# Patient Record
Sex: Male | Born: 1965 | Race: Black or African American | Hispanic: No | Smoking: Never smoker
Health system: Southern US, Community
[De-identification: ages and names within clinical notes are randomized; demographics above are authoritative.]

## PROBLEM LIST (undated history)

## (undated) ENCOUNTER — Emergency Department (HOSPITAL_COMMUNITY): Admission: EM | Payer: No Typology Code available for payment source | Source: Home / Self Care

## (undated) DIAGNOSIS — Z8709 Personal history of other diseases of the respiratory system: Secondary | ICD-10-CM

## (undated) DIAGNOSIS — L89159 Pressure ulcer of sacral region, unspecified stage: Secondary | ICD-10-CM

## (undated) DIAGNOSIS — Z8669 Personal history of other diseases of the nervous system and sense organs: Secondary | ICD-10-CM

## (undated) DIAGNOSIS — J860 Pyothorax with fistula: Secondary | ICD-10-CM

## (undated) DIAGNOSIS — F419 Anxiety disorder, unspecified: Secondary | ICD-10-CM

## (undated) DIAGNOSIS — S129XXA Fracture of neck, unspecified, initial encounter: Secondary | ICD-10-CM

## (undated) DIAGNOSIS — M1990 Unspecified osteoarthritis, unspecified site: Secondary | ICD-10-CM

## (undated) DIAGNOSIS — L8990 Pressure ulcer of unspecified site, unspecified stage: Secondary | ICD-10-CM

## (undated) DIAGNOSIS — Z978 Presence of other specified devices: Secondary | ICD-10-CM

## (undated) DIAGNOSIS — T8859XA Other complications of anesthesia, initial encounter: Secondary | ICD-10-CM

## (undated) DIAGNOSIS — Z87442 Personal history of urinary calculi: Secondary | ICD-10-CM

## (undated) DIAGNOSIS — S14109S Unspecified injury at unspecified level of cervical spinal cord, sequela: Secondary | ICD-10-CM

## (undated) DIAGNOSIS — Z960 Presence of urogenital implants: Secondary | ICD-10-CM

## (undated) DIAGNOSIS — G8250 Quadriplegia, unspecified: Secondary | ICD-10-CM

## (undated) DIAGNOSIS — K219 Gastro-esophageal reflux disease without esophagitis: Secondary | ICD-10-CM

## (undated) DIAGNOSIS — D649 Anemia, unspecified: Secondary | ICD-10-CM

## (undated) DIAGNOSIS — S2220XA Unspecified fracture of sternum, initial encounter for closed fracture: Secondary | ICD-10-CM

## (undated) DIAGNOSIS — T884XXA Failed or difficult intubation, initial encounter: Secondary | ICD-10-CM

## (undated) HISTORY — PX: PEG TUBE REMOVAL: SHX2187

## (undated) HISTORY — PX: APPENDECTOMY: SHX54

## (undated) HISTORY — PX: OTHER SURGICAL HISTORY: SHX169

---

## 1999-04-08 DIAGNOSIS — K219 Gastro-esophageal reflux disease without esophagitis: Secondary | ICD-10-CM | POA: Insufficient documentation

## 2006-10-28 ENCOUNTER — Ambulatory Visit (HOSPITAL_COMMUNITY): Admission: RE | Admit: 2006-10-28 | Discharge: 2006-10-28 | Payer: Self-pay | Admitting: Family Medicine

## 2006-10-29 ENCOUNTER — Ambulatory Visit: Payer: Self-pay | Admitting: Cardiology

## 2011-03-31 ENCOUNTER — Encounter: Payer: Self-pay | Admitting: *Deleted

## 2011-03-31 ENCOUNTER — Emergency Department (HOSPITAL_COMMUNITY)
Admission: EM | Admit: 2011-03-31 | Discharge: 2011-03-31 | Disposition: A | Discharge status: Home / Self Care | Payer: BC Managed Care – PPO | Attending: Emergency Medicine | Admitting: Emergency Medicine

## 2011-03-31 ENCOUNTER — Emergency Department (HOSPITAL_COMMUNITY): Payer: BC Managed Care – PPO

## 2011-03-31 DIAGNOSIS — Z9889 Other specified postprocedural states: Secondary | ICD-10-CM | POA: Insufficient documentation

## 2011-03-31 DIAGNOSIS — X500XXA Overexertion from strenuous movement or load, initial encounter: Secondary | ICD-10-CM | POA: Insufficient documentation

## 2011-03-31 DIAGNOSIS — M47816 Spondylosis without myelopathy or radiculopathy, lumbar region: Secondary | ICD-10-CM

## 2011-03-31 DIAGNOSIS — S39012A Strain of muscle, fascia and tendon of lower back, initial encounter: Secondary | ICD-10-CM

## 2011-03-31 DIAGNOSIS — S139XXA Sprain of joints and ligaments of unspecified parts of neck, initial encounter: Secondary | ICD-10-CM | POA: Insufficient documentation

## 2011-03-31 DIAGNOSIS — M47817 Spondylosis without myelopathy or radiculopathy, lumbosacral region: Secondary | ICD-10-CM | POA: Insufficient documentation

## 2011-03-31 MED ORDER — DIAZEPAM 5 MG PO TABS
5.0000 mg | ORAL_TABLET | Freq: Once | ORAL | Status: AC
  Administered 2011-03-31: 5 mg via ORAL
  Filled 2011-03-31: qty 1

## 2011-03-31 MED ORDER — IBUPROFEN 600 MG PO TABS: 600.0000 mg | ORAL_TABLET | Freq: Four times a day (QID) | ORAL | Status: AC | PRN

## 2011-03-31 MED ORDER — HYDROCODONE-ACETAMINOPHEN 5-325 MG PO TABS
1.0000 | ORAL_TABLET | Freq: Once | ORAL | Status: AC
  Administered 2011-03-31: 1 via ORAL
  Filled 2011-03-31: qty 1

## 2011-03-31 MED ORDER — HYDROCODONE-ACETAMINOPHEN 5-325 MG PO TABS: 1.0000 | ORAL_TABLET | ORAL | Status: AC | PRN

## 2011-03-31 MED ORDER — KETOROLAC TROMETHAMINE 60 MG/2ML IM SOLN
60.0000 mg | Freq: Once | INTRAMUSCULAR | Status: AC
  Administered 2011-03-31: 60 mg via INTRAMUSCULAR
  Filled 2011-03-31: qty 2

## 2011-03-31 MED ORDER — DIAZEPAM 5 MG PO TABS: 5.0000 mg | ORAL_TABLET | Freq: Four times a day (QID) | ORAL | Status: AC | PRN

## 2011-03-31 NOTE — ED Notes (Signed)
Pt c/o lower back pain x 45 minutes. States that he bent over to open the car door and it started hurting.

## 2011-04-01 NOTE — ED Provider Notes (Signed)
Medical screening examination/treatment/procedure(s) were performed by non-physician practitioner and as supervising physician I was immediately available for consultation/collaboration.   Shelda Jakes, MD 04/01/11 [phone]

## 2011-04-01 NOTE — ED Provider Notes (Signed)
History     CSN: [id number]  Arrival date & time 03/31/11  1138   First MD Initiated Contact with Patient 03/31/11 1403      Chief Complaint  Patient presents with  . Back Pain    (Consider location/radiation/quality/duration/timing/severity/associated sxs/prior treatment) Patient is a 45 y.o. male presenting with back pain. The history is provided by the patient.  Back Pain  This is a new problem. The current episode started less than 1 hour ago. The problem occurs constantly. The problem has not changed since onset.Associated with: He bent over to open a car door,  and developed immediate pain in his left lower back,  making it difficult now to stand upright. The pain is present in the lumbar spine. The quality of the pain is described as stabbing. The pain does not radiate. The pain is at a severity of 10/10. The pain is severe (straightening makes worse). The symptoms are aggravated by certain positions. Pertinent negatives include no chest pain, no fever, no numbness, no headaches, no abdominal pain, no bowel incontinence, no dysuria, no leg pain, no paresthesias, no paresis, no tingling and no weakness. He has tried nothing for the symptoms.    History reviewed. No pertinent past medical history.  Past Surgical History  Procedure Date  . Appendectomy     History reviewed. No pertinent family history.  History  Substance Use Topics  . Smoking status: Never Smoker   . Smokeless tobacco: Not on file  . Alcohol Use: Yes     daily      Review of Systems  Constitutional: Negative for fever.  HENT: Negative for congestion, sore throat and neck pain.   Eyes: Negative.   Respiratory: Negative for chest tightness and shortness of breath.   Cardiovascular: Negative for chest pain.  Gastrointestinal: Negative for nausea, abdominal pain and bowel incontinence.  Genitourinary: Negative.  Negative for dysuria.  Musculoskeletal: Positive for back pain. Negative for joint  swelling and arthralgias.  Skin: Negative.  Negative for rash and wound.  Neurological: Negative for dizziness, tingling, weakness, light-headedness, numbness, headaches and paresthesias.  Hematological: Negative.   Psychiatric/Behavioral: Negative.     Allergies  Niacin and related  Home Medications   Current Outpatient Rx  Name Route Sig Dispense Refill  . DIAZEPAM 5 MG PO TABS Oral Take 1 tablet (5 mg total) by mouth every 6 (six) hours as needed (muscle spasm). 15 tablet 0  . HYDROCODONE-ACETAMINOPHEN 5-325 MG PO TABS Oral Take 1 tablet by mouth every 4 (four) hours as needed for pain. 15 tablet 0  . IBUPROFEN 600 MG PO TABS Oral Take 1 tablet (600 mg total) by mouth every 6 (six) hours as needed for pain. 30 tablet 0    BP 142/89  Pulse 83  Temp(Src) 98 F (36.7 C) (Oral)  Resp 20  Ht 6' (1.829 m)  Wt 170 lb (77.111 kg)  BMI 23.06 kg/m2  SpO2 98%  Physical Exam  Nursing note and vitals reviewed. Constitutional: He is oriented to person, place, and time. He appears well-developed and well-nourished.  HENT:  Head: Normocephalic.  Eyes: Conjunctivae are normal.  Neck: Normal range of motion. Neck supple.  Cardiovascular: Regular rhythm and intact distal pulses.        Pedal pulses normal.  Pulmonary/Chest: Effort normal. He has no wheezes.  Abdominal: Soft. Bowel sounds are normal. He exhibits no distension and no mass.  Musculoskeletal: Normal range of motion. He exhibits no edema.  Lumbar back: He exhibits tenderness. He exhibits no swelling, no edema and no spasm.  Neurological: He is alert and oriented to person, place, and time. He has normal strength. He displays no atrophy and no tremor. No cranial nerve deficit or sensory deficit. Gait normal.  Reflex Scores:      Patellar reflexes are 2+ on the right side and 2+ on the left side.      Achilles reflexes are 2+ on the right side and 2+ on the left side.      No strength deficit noted in hip and knee flexor  and extensor muscle groups.  Ankle flexion and extension intact.  Skin: Skin is warm and dry.  Psychiatric: He has a normal mood and affect.    ED Course  Procedures (including critical care time)  Labs Reviewed - No data to display Dg Lumbar Spine Complete  03/31/2011  *RADIOLOGY REPORT*  Clinical Data: Low back pain  LUMBAR SPINE - COMPLETE 4+ VIEW  Comparison: None.  Findings: There is no evidence of lumbar spine fracture.  There is mild multilevel lumbar spine degenerative disc disease with ventral endplate spurring.  Most severe at L4-5. The vertebral body height is well preserved.  IMPRESSION:  1.  Lumbar degenerative disc disease. 2.  No acute changes.  Original Report Authenticated By: Rosealee Albee, M.D.     1. Lumbar strain   2. Degenerative arthritis of lumbar spine    Valium 5 mg PO,  toradol 60 IM given with no improvement in pain.  Added hydrocodoone prior to dc.     MDM  Acute lumbar strain with no neuro deficits on exam or by history.        Candis Musa, PA 04/01/11 [phone]

## 2011-04-10 ENCOUNTER — Other Ambulatory Visit (HOSPITAL_COMMUNITY): Payer: Self-pay | Admitting: Physical Medicine and Rehabilitation

## 2011-04-10 DIAGNOSIS — M62838 Other muscle spasm: Secondary | ICD-10-CM

## 2011-04-10 DIAGNOSIS — M545 Low back pain: Secondary | ICD-10-CM

## 2011-04-10 DIAGNOSIS — IMO0002 Reserved for concepts with insufficient information to code with codable children: Secondary | ICD-10-CM

## 2011-04-10 DIAGNOSIS — M5126 Other intervertebral disc displacement, lumbar region: Secondary | ICD-10-CM

## 2011-04-10 DIAGNOSIS — M47817 Spondylosis without myelopathy or radiculopathy, lumbosacral region: Secondary | ICD-10-CM

## 2011-04-11 ENCOUNTER — Ambulatory Visit (HOSPITAL_COMMUNITY)
Admission: RE | Admit: 2011-04-11 | Discharge: 2011-04-11 | Disposition: A | Discharge status: Home / Self Care | Payer: BC Managed Care – PPO | Source: Ambulatory Visit | Attending: Physical Medicine and Rehabilitation | Admitting: Physical Medicine and Rehabilitation

## 2011-04-11 DIAGNOSIS — M47817 Spondylosis without myelopathy or radiculopathy, lumbosacral region: Secondary | ICD-10-CM

## 2011-04-11 DIAGNOSIS — M79609 Pain in unspecified limb: Secondary | ICD-10-CM | POA: Insufficient documentation

## 2011-04-11 DIAGNOSIS — IMO0002 Reserved for concepts with insufficient information to code with codable children: Secondary | ICD-10-CM

## 2011-04-11 DIAGNOSIS — M5126 Other intervertebral disc displacement, lumbar region: Secondary | ICD-10-CM | POA: Insufficient documentation

## 2011-04-11 DIAGNOSIS — M62838 Other muscle spasm: Secondary | ICD-10-CM

## 2011-04-11 DIAGNOSIS — M545 Low back pain, unspecified: Secondary | ICD-10-CM | POA: Insufficient documentation

## 2013-11-20 ENCOUNTER — Encounter (HOSPITAL_COMMUNITY): Payer: Self-pay | Admitting: Emergency Medicine

## 2013-11-20 ENCOUNTER — Emergency Department (HOSPITAL_COMMUNITY)
Admission: EM | Admit: 2013-11-20 | Discharge: 2013-11-20 | Disposition: A | Discharge status: Home / Self Care | Payer: BC Managed Care – PPO | Attending: Emergency Medicine | Admitting: Emergency Medicine

## 2013-11-20 DIAGNOSIS — L02413 Cutaneous abscess of right upper limb: Secondary | ICD-10-CM

## 2013-11-20 DIAGNOSIS — Z7901 Long term (current) use of anticoagulants: Secondary | ICD-10-CM | POA: Diagnosis not present

## 2013-11-20 DIAGNOSIS — IMO0002 Reserved for concepts with insufficient information to code with codable children: Secondary | ICD-10-CM | POA: Diagnosis present

## 2013-11-20 MED ORDER — BUPIVACAINE HCL (PF) 0.25 % IJ SOLN
INTRAMUSCULAR | Status: AC
  Administered 2013-11-20: 10:00:00
  Filled 2013-11-20: qty 30

## 2013-11-20 MED ORDER — DOXYCYCLINE HYCLATE 100 MG PO TABS
100.0000 mg | ORAL_TABLET | Freq: Once | ORAL | Status: AC
  Administered 2013-11-20: 100 mg via ORAL
  Filled 2013-11-20: qty 1

## 2013-11-20 MED ORDER — DOXYCYCLINE HYCLATE 100 MG PO CAPS: 100.0000 mg | ORAL_CAPSULE | Freq: Two times a day (BID) | ORAL | Status: DC

## 2013-11-20 MED ORDER — KETOROLAC TROMETHAMINE 10 MG PO TABS
10.0000 mg | ORAL_TABLET | Freq: Once | ORAL | Status: AC
  Administered 2013-11-20: 10 mg via ORAL
  Filled 2013-11-20: qty 1

## 2013-11-20 MED ORDER — HYDROCODONE-ACETAMINOPHEN 5-325 MG PO TABS: 1.0000 | ORAL_TABLET | ORAL | Status: DC | PRN
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
## 2013-11-20 NOTE — ED Notes (Signed)
telfa applied to site and secured in place with stretch bandage. Bandage secured with tape.

## 2013-11-20 NOTE — ED Provider Notes (Signed)
Medical screening examination/treatment/procedure(s) were performed by non-physician practitioner and as supervising physician I was immediately available for consultation/collaboration.   EKG Interpretation None      Rolland Porter, MD, Abram Sander   Janice Norrie, MD 11/20/13 [phone]

## 2013-11-20 NOTE — Discharge Instructions (Signed)
Please cleanse the wound to your forearm with soap and water daily. Please change dressing daily until the wound heals from the inside out. Please see your primary physician, or return to the emergency department if any red streaks up the arm, high fevers, or signs of advancing infection. Please use doxycycline 2 times daily with food until all taken. Use Tylenol or ibuprofen for mild pain, use Norco for more severe pain. Norco may cause drowsiness, please use with caution. Abscess An abscess (boil or furuncle) is an infected area on or under the skin. This area is filled with yellowish-white fluid (pus) and other material (debris). HOME CARE   Only take medicines as told by your doctor.  If you were given antibiotic medicine, take it as directed. Finish the medicine even if you start to feel better.  If gauze is used, follow your doctor's directions for changing the gauze.  To avoid spreading the infection:  Keep your abscess covered with a bandage.  Wash your hands well.  Do not share personal care items, towels, or whirlpools with others.  Avoid skin contact with others.  Keep your skin and clothes clean around the abscess.  Keep all doctor visits as told. GET HELP RIGHT AWAY IF:   You have more pain, puffiness (swelling), or redness in the wound site.  You have more fluid or blood coming from the wound site.  You have muscle aches, chills, or you feel sick.  You have a fever. MAKE SURE YOU:   Understand these instructions.  Will watch your condition.  Will get help right away if you are not doing well or get worse. Document Released: 09/10/2007 Document Revised: 09/23/2011 Document Reviewed: 06/06/2011 [facility] Patient Information 2015 [place], [place]. This information is not intended to replace advice given to you by your health care provider. Make sure you discuss any questions you have with your health care provider.

## 2013-11-20 NOTE — ED Provider Notes (Signed)
CSN: [id number]     Arrival date & time 11/20/13  9326 History   First MD Initiated Contact with Patient 11/20/13 [phone]     Chief Complaint  Patient presents with  . Abscess     (Consider location/radiation/quality/duration/timing/severity/associated sxs/prior Treatment) HPI Comments: And patient is a 48 year old male who presents to the emergency department with complaint of patient works abscess on the arm". The patient states that approximately a week ago he noted a pimple on his forearm area. He popped the pimple a few days ago, and now he has increased swelling, redness, and warmth of the abscess area. The patient was seen at one of the urgent care centers, but they were concerned because of the proximity of the abscess to the at elbow and wanted the patient to come to the emergency department for additional evaluation and management. The patient states that he has not had any nausea vomiting, he thinks he may have had a few chills on yesterday but he is not sure. He has not measured any elevated temperatures. The patient denies any medical conditions that would compromise his immune system.  Patient is a 48 y.o. male presenting with abscess. The history is provided by the patient.  Abscess Location:  Shoulder/arm Shoulder/arm abscess location:  R forearm   History reviewed. No pertinent past medical history. Past Surgical History  Procedure Laterality Date  . Appendectomy     No family history on file. History  Substance Use Topics  . Smoking status: Never Smoker   . Smokeless tobacco: Not on file  . Alcohol Use: Yes     Comment: daily    Review of Systems  Constitutional: Negative for activity change.       All ROS Neg except as noted in HPI  HENT: Negative for nosebleeds.   Eyes: Negative for photophobia and discharge.  Respiratory: Negative for cough, shortness of breath and wheezing.   Cardiovascular: Negative for chest pain and palpitations.  Gastrointestinal:  Negative for abdominal pain and blood in stool.  Genitourinary: Negative for dysuria, frequency and hematuria.  Musculoskeletal: Positive for back pain. Negative for arthralgias and neck pain.  Skin: Positive for wound.  Neurological: Negative for dizziness, seizures and speech difficulty.  Psychiatric/Behavioral: Negative for hallucinations and confusion.      Allergies  Niacin and related  Home Medications   Prior to Admission medications   Medication Sig Start Date End Date Taking? Authorizing Provider  doxycycline (VIBRAMYCIN) 100 MG capsule Take 1 capsule (100 mg total) by mouth 2 (two) times daily. 11/20/13   Lenox Ahr, PA-C  HYDROcodone-acetaminophen (NORCO/VICODIN) 5-325 MG per tablet Take 1 tablet by mouth every 4 (four) hours as needed. 11/20/13   Lenox Ahr, PA-C   BP 157/103  Pulse 77  Temp(Src) 98 F (36.7 C) (Oral)  Resp 20  Ht 6' (1.829 m)  Wt 160 lb (72.576 kg)  BMI 21.70 kg/m2  SpO2 100% Physical Exam  Nursing note and vitals reviewed. Constitutional: He is oriented to person, place, and time. He appears well-developed and well-nourished.  Non-toxic appearance.  HENT:  Head: Normocephalic.  Right Ear: Tympanic membrane and external ear normal.  Left Ear: Tympanic membrane and external ear normal.  Eyes: EOM and lids are normal. Pupils are equal, round, and reactive to light.  Neck: Normal range of motion. Neck supple. Carotid bruit is not present.  Cardiovascular: Normal rate, regular rhythm, normal heart sounds, intact distal pulses and normal pulses.   Pulmonary/Chest: Breath sounds normal.  No respiratory distress.  Abdominal: Soft. Bowel sounds are normal. There is no tenderness. There is no guarding.  Musculoskeletal: Normal range of motion.  There is full range of motion of the right shoulder and elbow. She there is an abscess of the proximal ulnar area of the right forearm. There is no drainage appreciated at this time. There is no red  streaking noted. The area is fluctuant.  Lymphadenopathy:       Head (right side): No submandibular adenopathy present.       Head (left side): No submandibular adenopathy present.    He has no cervical adenopathy.  Neurological: He is alert and oriented to person, place, and time. He has normal strength. No cranial nerve deficit or sensory deficit.  Skin: Skin is warm and dry.  Psychiatric: He has a normal mood and affect. His speech is normal.    ED Course  INCISION AND DRAINAGE Date/Time: 11/20/2013 10:03 AM Performed by: Lenox Ahr Authorized by: Lenox Ahr Consent: Verbal consent obtained. Risks and benefits: risks, benefits and alternatives were discussed Consent given by: patient Patient understanding: patient states understanding of the procedure being performed Required items: required blood products, implants, devices, and special equipment available Patient identity confirmed: arm band Time out: Immediately prior to procedure a "time out" was called to verify the correct patient, procedure, equipment, support staff and site/side marked as required. Type: abscess Body area: upper extremity Location details: right arm Anesthesia: local infiltration Local anesthetic: bupivacaine 0.25% without epinephrine Patient sedated: no Scalpel size: 11 Incision type: single straight Complexity: simple Drainage: purulent Drainage amount: moderate Wound treatment: wound left open Patient tolerance: Patient tolerated the procedure well with no immediate complications.   (including critical care time) Labs Review Labs Reviewed  CULTURE, ROUTINE-ABSCESS    Imaging Review No results found.   EKG Interpretation None      MDM Blood pressure is elevated at 157/103, otherwise vital signs are well within normal limits. Pulse oximetry is 100% on room air. Within normal limits by my interpretation. Patient has an abscess of the right forearm. There is no motor sensory  compromise. There is no red streaking. The elbow is not hot. There is no effusion of the elbow. Incision and drainage was carried out. Culture was sent to the lab.  The patient has been advised to change the dressing daily. He is provided with a prescription for doxycycline and Norco for pain. The patient is to return to the emergency department if any changes, problems, or concerns.    Final diagnoses:  Abscess of forearm, right    *I have reviewed nursing notes, vital signs, and all appropriate lab and imaging results for this patient.Lenox Ahr, PA-C 11/20/13 1006

## 2013-11-23 ENCOUNTER — Telehealth (HOSPITAL_BASED_OUTPATIENT_CLINIC_OR_DEPARTMENT_OTHER): Payer: Self-pay

## 2013-11-23 LAB — CULTURE, ROUTINE-ABSCESS: GRAM STAIN: NONE SEEN

## 2013-11-23 NOTE — Telephone Encounter (Signed)
Pt returned call.  Pt informed of dx, tx prescribed approp. And provided MRSA education.

## 2013-11-23 NOTE — Telephone Encounter (Signed)
Results called from [facility].  Abscess RFA -> (+) MRSA.  Rx given in ED for Doxycycline -> sensitive to the same.  Call and notify pt.  8/19 @12 :31 Left message for pt to return call.

## 2013-11-24 NOTE — Telephone Encounter (Signed)
Post ED Visit - Positive Culture Follow-up  Culture report reviewed by antimicrobial stewardship pharmacist: []  Wes [place], Pharm.D., BCPS []  Heide Guile, Pharm.D., BCPS []  Alycia Rossetti, Pharm.D., BCPS []  O[place]ong, Pharm.D., BCPS, AAHIVP []  Legrand Como, Pharm.D., BCPS, AAHIVP [x]  Hassie Bruce, Pharm.D. []  Cassie Nicole Kindred, [place].D.  Positive abscess culture moderate MRSA Treated with doxycycline 100mg  po caps bid x 7 days, organism sensitive to the same and no further patient follow-up is required at this time.  Hazle Nordmann 11/24/2013, 10:51 AM

## 2015-04-05 ENCOUNTER — Inpatient Hospital Stay (HOSPITAL_COMMUNITY): Payer: BLUE CROSS/BLUE SHIELD | Admitting: Certified Registered"

## 2015-04-05 ENCOUNTER — Inpatient Hospital Stay (HOSPITAL_COMMUNITY): Payer: BLUE CROSS/BLUE SHIELD

## 2015-04-05 ENCOUNTER — Encounter (HOSPITAL_COMMUNITY): Payer: Self-pay | Admitting: Radiology

## 2015-04-05 ENCOUNTER — Inpatient Hospital Stay (HOSPITAL_COMMUNITY)
Admission: EM | Admit: 2015-04-05 | Discharge: 2015-05-08 | DRG: 003 | Disposition: A | Discharge status: Other Acute Inpatient Hospital | Payer: BLUE CROSS/BLUE SHIELD | Attending: General Surgery | Admitting: General Surgery

## 2015-04-05 ENCOUNTER — Emergency Department (HOSPITAL_COMMUNITY): Payer: BLUE CROSS/BLUE SHIELD

## 2015-04-05 DIAGNOSIS — S2220XA Unspecified fracture of sternum, initial encounter for closed fracture: Secondary | ICD-10-CM | POA: Diagnosis present

## 2015-04-05 DIAGNOSIS — J9601 Acute respiratory failure with hypoxia: Secondary | ICD-10-CM | POA: Diagnosis present

## 2015-04-05 DIAGNOSIS — J690 Pneumonitis due to inhalation of food and vomit: Secondary | ICD-10-CM | POA: Diagnosis present

## 2015-04-05 DIAGNOSIS — I824Y2 Acute embolism and thrombosis of unspecified deep veins of left proximal lower extremity: Secondary | ICD-10-CM | POA: Diagnosis not present

## 2015-04-05 DIAGNOSIS — I82A11 Acute embolism and thrombosis of right axillary vein: Secondary | ICD-10-CM | POA: Diagnosis not present

## 2015-04-05 DIAGNOSIS — I469 Cardiac arrest, cause unspecified: Secondary | ICD-10-CM | POA: Diagnosis not present

## 2015-04-05 DIAGNOSIS — A419 Sepsis, unspecified organism: Secondary | ICD-10-CM | POA: Diagnosis not present

## 2015-04-05 DIAGNOSIS — T1490 Injury, unspecified: Secondary | ICD-10-CM | POA: Diagnosis present

## 2015-04-05 DIAGNOSIS — S299XXA Unspecified injury of thorax, initial encounter: Secondary | ICD-10-CM

## 2015-04-05 DIAGNOSIS — S24151A Other incomplete lesion at T1 level of thoracic spinal cord, initial encounter: Secondary | ICD-10-CM | POA: Diagnosis present

## 2015-04-05 DIAGNOSIS — Z978 Presence of other specified devices: Secondary | ICD-10-CM

## 2015-04-05 DIAGNOSIS — S12300A Unspecified displaced fracture of fourth cervical vertebra, initial encounter for closed fracture: Secondary | ICD-10-CM | POA: Diagnosis present

## 2015-04-05 DIAGNOSIS — B957 Other staphylococcus as the cause of diseases classified elsewhere: Secondary | ICD-10-CM | POA: Diagnosis not present

## 2015-04-05 DIAGNOSIS — R402362 Coma scale, best motor response, obeys commands, at arrival to emergency department: Secondary | ICD-10-CM | POA: Diagnosis present

## 2015-04-05 DIAGNOSIS — M2392 Unspecified internal derangement of left knee: Secondary | ICD-10-CM | POA: Diagnosis present

## 2015-04-05 DIAGNOSIS — L89893 Pressure ulcer of other site, stage 3: Secondary | ICD-10-CM | POA: Diagnosis not present

## 2015-04-05 DIAGNOSIS — R1310 Dysphagia, unspecified: Secondary | ICD-10-CM | POA: Diagnosis not present

## 2015-04-05 DIAGNOSIS — Z419 Encounter for procedure for purposes other than remedying health state, unspecified: Secondary | ICD-10-CM

## 2015-04-05 DIAGNOSIS — Z930 Tracheostomy status: Secondary | ICD-10-CM

## 2015-04-05 DIAGNOSIS — G8254 Quadriplegia, C5-C7 incomplete: Secondary | ICD-10-CM | POA: Diagnosis present

## 2015-04-05 DIAGNOSIS — R402242 Coma scale, best verbal response, confused conversation, at arrival to emergency department: Secondary | ICD-10-CM | POA: Diagnosis present

## 2015-04-05 DIAGNOSIS — J15212 Pneumonia due to Methicillin resistant Staphylococcus aureus: Secondary | ICD-10-CM | POA: Diagnosis not present

## 2015-04-05 DIAGNOSIS — Y95 Nosocomial condition: Secondary | ICD-10-CM | POA: Diagnosis not present

## 2015-04-05 DIAGNOSIS — T148XXA Other injury of unspecified body region, initial encounter: Secondary | ICD-10-CM

## 2015-04-05 DIAGNOSIS — R55 Syncope and collapse: Secondary | ICD-10-CM | POA: Diagnosis not present

## 2015-04-05 DIAGNOSIS — S22019A Unspecified fracture of first thoracic vertebra, initial encounter for closed fracture: Secondary | ICD-10-CM | POA: Diagnosis present

## 2015-04-05 DIAGNOSIS — S12400D Unspecified displaced fracture of fifth cervical vertebra, subsequent encounter for fracture with routine healing: Secondary | ICD-10-CM | POA: Diagnosis not present

## 2015-04-05 DIAGNOSIS — M25462 Effusion, left knee: Secondary | ICD-10-CM

## 2015-04-05 DIAGNOSIS — I82623 Acute embolism and thrombosis of deep veins of upper extremity, bilateral: Secondary | ICD-10-CM | POA: Diagnosis not present

## 2015-04-05 DIAGNOSIS — S14157A Other incomplete lesion at C7 level of cervical spinal cord, initial encounter: Secondary | ICD-10-CM | POA: Diagnosis present

## 2015-04-05 DIAGNOSIS — J189 Pneumonia, unspecified organism: Secondary | ICD-10-CM | POA: Diagnosis not present

## 2015-04-05 DIAGNOSIS — S12600A Unspecified displaced fracture of seventh cervical vertebra, initial encounter for closed fracture: Secondary | ICD-10-CM

## 2015-04-05 DIAGNOSIS — Y907 Blood alcohol level of 200-239 mg/100 ml: Secondary | ICD-10-CM | POA: Diagnosis present

## 2015-04-05 DIAGNOSIS — F10129 Alcohol abuse with intoxication, unspecified: Secondary | ICD-10-CM | POA: Diagnosis present

## 2015-04-05 DIAGNOSIS — T794XXA Traumatic shock, initial encounter: Secondary | ICD-10-CM

## 2015-04-05 DIAGNOSIS — S14155A Other incomplete lesion at C5 level of cervical spinal cord, initial encounter: Secondary | ICD-10-CM | POA: Diagnosis present

## 2015-04-05 DIAGNOSIS — D62 Acute posthemorrhagic anemia: Secondary | ICD-10-CM | POA: Diagnosis present

## 2015-04-05 DIAGNOSIS — J9600 Acute respiratory failure, unspecified whether with hypoxia or hypercapnia: Secondary | ICD-10-CM

## 2015-04-05 DIAGNOSIS — Z01818 Encounter for other preprocedural examination: Secondary | ICD-10-CM

## 2015-04-05 DIAGNOSIS — S14154A Other incomplete lesion at C4 level of cervical spinal cord, initial encounter: Secondary | ICD-10-CM | POA: Diagnosis present

## 2015-04-05 DIAGNOSIS — S83106A Unspecified dislocation of unspecified knee, initial encounter: Secondary | ICD-10-CM

## 2015-04-05 DIAGNOSIS — J9811 Atelectasis: Secondary | ICD-10-CM | POA: Diagnosis present

## 2015-04-05 DIAGNOSIS — R509 Fever, unspecified: Secondary | ICD-10-CM

## 2015-04-05 DIAGNOSIS — F419 Anxiety disorder, unspecified: Secondary | ICD-10-CM | POA: Diagnosis not present

## 2015-04-05 DIAGNOSIS — R001 Bradycardia, unspecified: Secondary | ICD-10-CM | POA: Diagnosis not present

## 2015-04-05 DIAGNOSIS — F10929 Alcohol use, unspecified with intoxication, unspecified: Secondary | ICD-10-CM | POA: Diagnosis present

## 2015-04-05 DIAGNOSIS — L8990 Pressure ulcer of unspecified site, unspecified stage: Secondary | ICD-10-CM | POA: Insufficient documentation

## 2015-04-05 DIAGNOSIS — G9340 Encephalopathy, unspecified: Secondary | ICD-10-CM | POA: Insufficient documentation

## 2015-04-05 DIAGNOSIS — S8982XA Other specified injuries of left lower leg, initial encounter: Secondary | ICD-10-CM | POA: Diagnosis present

## 2015-04-05 DIAGNOSIS — E46 Unspecified protein-calorie malnutrition: Secondary | ICD-10-CM | POA: Diagnosis present

## 2015-04-05 DIAGNOSIS — J9690 Respiratory failure, unspecified, unspecified whether with hypoxia or hypercapnia: Secondary | ICD-10-CM

## 2015-04-05 DIAGNOSIS — F438 Other reactions to severe stress: Secondary | ICD-10-CM

## 2015-04-05 DIAGNOSIS — E870 Hyperosmolality and hypernatremia: Secondary | ICD-10-CM | POA: Diagnosis not present

## 2015-04-05 DIAGNOSIS — Z9289 Personal history of other medical treatment: Secondary | ICD-10-CM

## 2015-04-05 DIAGNOSIS — S2221XA Fracture of manubrium, initial encounter for closed fracture: Secondary | ICD-10-CM | POA: Diagnosis present

## 2015-04-05 DIAGNOSIS — R52 Pain, unspecified: Secondary | ICD-10-CM

## 2015-04-05 DIAGNOSIS — Z9911 Dependence on respirator [ventilator] status: Secondary | ICD-10-CM | POA: Diagnosis not present

## 2015-04-05 DIAGNOSIS — R579 Shock, unspecified: Secondary | ICD-10-CM | POA: Diagnosis present

## 2015-04-05 DIAGNOSIS — I824Y1 Acute embolism and thrombosis of unspecified deep veins of right proximal lower extremity: Secondary | ICD-10-CM | POA: Diagnosis not present

## 2015-04-05 DIAGNOSIS — S12400A Unspecified displaced fracture of fifth cervical vertebra, initial encounter for closed fracture: Secondary | ICD-10-CM | POA: Diagnosis present

## 2015-04-05 DIAGNOSIS — Z91013 Allergy to seafood: Secondary | ICD-10-CM | POA: Diagnosis not present

## 2015-04-05 DIAGNOSIS — S129XXA Fracture of neck, unspecified, initial encounter: Secondary | ICD-10-CM

## 2015-04-05 DIAGNOSIS — R451 Restlessness and agitation: Secondary | ICD-10-CM | POA: Diagnosis not present

## 2015-04-05 DIAGNOSIS — Z4659 Encounter for fitting and adjustment of other gastrointestinal appliance and device: Secondary | ICD-10-CM

## 2015-04-05 DIAGNOSIS — R402132 Coma scale, eyes open, to sound, at arrival to emergency department: Secondary | ICD-10-CM | POA: Diagnosis present

## 2015-04-05 DIAGNOSIS — S12300D Unspecified displaced fracture of fourth cervical vertebra, subsequent encounter for fracture with routine healing: Secondary | ICD-10-CM | POA: Diagnosis not present

## 2015-04-05 LAB — POCT I-STAT 3, ART BLOOD GAS (G3+)
Acid-base deficit: 6 mmol/L — ABNORMAL HIGH (ref 0.0–2.0)
Bicarbonate: 19.8 meq/L — ABNORMAL LOW (ref 20.0–24.0)
O2 Saturation: 94 %
PCO2 ART: 35.2 mmHg (ref 35.0–45.0)
PH ART: 7.343 — AB (ref 7.350–7.450)
Patient temperature: 93
TCO2: 21 mmol/L (ref 0–100)
pO2, Arterial: 65 mmHg — ABNORMAL LOW (ref 80.0–100.0)

## 2015-04-05 LAB — COMPREHENSIVE METABOLIC PANEL
ALBUMIN: 3.4 g/dL — AB (ref 3.5–5.0)
ALK PHOS: 63 U/L (ref 38–126)
ALT: 29 U/L (ref 17–63)
AST: 32 U/L (ref 15–41)
Anion gap: 11 (ref 5–15)
BILIRUBIN TOTAL: 0.1 mg/dL — AB (ref 0.3–1.2)
BUN: 13 mg/dL (ref 6–20)
CALCIUM: 8.4 mg/dL — AB (ref 8.9–10.3)
CO2: 20 mmol/L — ABNORMAL LOW (ref 22–32)
Chloride: 108 mmol/L (ref 101–111)
Creatinine, Ser: 1.08 mg/dL (ref 0.61–1.24)
GFR calc Af Amer: >60 mL/min (ref >60)
GFR calc non Af Amer: >60 mL/min (ref >60)
GLUCOSE: 120 mg/dL — AB (ref 65–99)
Potassium: 3.7 mmol/L (ref 3.5–5.1)
Sodium: 139 mmol/L (ref 135–145)
TOTAL PROTEIN: 5.7 g/dL — AB (ref 6.5–8.1)

## 2015-04-05 LAB — BASIC METABOLIC PANEL
ANION GAP: 7 (ref 5–15)
BUN: 12 mg/dL (ref 6–20)
CO2: 20 mmol/L — AB (ref 22–32)
Calcium: 8 mg/dL — ABNORMAL LOW (ref 8.9–10.3)
Chloride: 111 mmol/L (ref 101–111)
Creatinine, Ser: 0.7 mg/dL (ref 0.61–1.24)
GFR calc Af Amer: >60 mL/min (ref >60)
GFR calc non Af Amer: >60 mL/min (ref >60)
GLUCOSE: 96 mg/dL (ref 65–99)
Potassium: 3.9 mmol/L (ref 3.5–5.1)
Sodium: 138 mmol/L (ref 135–145)

## 2015-04-05 LAB — PREPARE FRESH FROZEN PLASMA
UNIT DIVISION: 0
Unit division: 0

## 2015-04-05 LAB — TYPE AND SCREEN
ABO/RH(D): A POS
ANTIBODY SCREEN: NEGATIVE
UNIT DIVISION: 0
Unit division: 0

## 2015-04-05 LAB — PROTIME-INR
INR: 1.1 (ref 0.00–1.49)
PROTHROMBIN TIME: 14.4 s (ref 11.6–15.2)

## 2015-04-05 LAB — CBC
HCT: 34.2 % — ABNORMAL LOW (ref 39.0–52.0)
HCT: 35.1 % — ABNORMAL LOW (ref 39.0–52.0)
Hemoglobin: 11.4 g/dL — ABNORMAL LOW (ref 13.0–17.0)
Hemoglobin: 11.8 g/dL — ABNORMAL LOW (ref 13.0–17.0)
MCH: 27.9 pg (ref 26.0–34.0)
MCH: 28.3 pg (ref 26.0–34.0)
MCHC: 33.3 g/dL (ref 30.0–36.0)
MCHC: 33.6 g/dL (ref 30.0–36.0)
MCV: 83.6 fL (ref 78.0–100.0)
MCV: 84.2 fL (ref 78.0–100.0)
PLATELETS: 267 10*3/uL (ref 150–400)
Platelets: 276 10*3/uL (ref 150–400)
RBC: 4.09 MIL/uL — ABNORMAL LOW (ref 4.22–5.81)
RBC: 4.17 MIL/uL — ABNORMAL LOW (ref 4.22–5.81)
RDW: 14 % (ref 11.5–15.5)
RDW: 14.1 % (ref 11.5–15.5)
WBC: 7.5 10*3/uL (ref 4.0–10.5)
WBC: 9.9 10*3/uL (ref 4.0–10.5)

## 2015-04-05 LAB — ETHANOL: ALCOHOL ETHYL (B): 238 mg/dL — AB (ref <5)

## 2015-04-05 LAB — MRSA PCR SCREENING: MRSA BY PCR: NEGATIVE

## 2015-04-05 LAB — ABO/RH: ABO/RH(D): A POS

## 2015-04-05 LAB — BLOOD PRODUCT ORDER (VERBAL) VERIFICATION

## 2015-04-05 LAB — GLUCOSE, CAPILLARY: Glucose-Capillary: 115 mg/dL — ABNORMAL HIGH (ref 65–99)

## 2015-04-05 LAB — TRIGLYCERIDES: Triglycerides: 378 mg/dL — ABNORMAL HIGH (ref <150)

## 2015-04-05 MED ORDER — FENTANYL CITRATE (PF) 100 MCG/2ML IJ SOLN
INTRAMUSCULAR | Status: AC
  Filled 2015-04-05: qty 2

## 2015-04-05 MED ORDER — ETOMIDATE 2 MG/ML IV SOLN
INTRAVENOUS | Status: DC | PRN
  Administered 2015-04-05: 16 mg via INTRAVENOUS

## 2015-04-05 MED ORDER — PROPOFOL 1000 MG/100ML IV EMUL
INTRAVENOUS | Status: AC
  Filled 2015-04-05: qty 100

## 2015-04-05 MED ORDER — IOHEXOL 300 MG/ML  SOLN
100.0000 mL | Freq: Once | INTRAMUSCULAR | Status: AC | PRN
  Administered 2015-04-05: 100 mL via INTRAVENOUS

## 2015-04-05 MED ORDER — PHENYLEPHRINE HCL 10 MG/ML IJ SOLN
0.0000 ug/min | Freq: Once | INTRAVENOUS | Status: AC
  Administered 2015-04-05: 20 ug/min via INTRAVENOUS
  Filled 2015-04-05: qty 1

## 2015-04-05 MED ORDER — PHENYLEPHRINE HCL 10 MG/ML IJ SOLN
0.0000 ug/min | INTRAMUSCULAR | Status: DC
  Administered 2015-04-05: 15 ug/min via INTRAVENOUS
  Administered 2015-04-05: 25 ug/min via INTRAVENOUS
  Administered 2015-04-06: 15 ug/min via INTRAVENOUS
  Administered 2015-04-06 – 2015-04-07 (×3): 25 ug/min via INTRAVENOUS
  Filled 2015-04-05 (×6): qty 1

## 2015-04-05 MED ORDER — IOHEXOL 300 MG/ML  SOLN: 100.0000 mL | Freq: Once | INTRAMUSCULAR | Status: DC | PRN

## 2015-04-05 MED ORDER — PANTOPRAZOLE SODIUM 40 MG PO TBEC
40.0000 mg | DELAYED_RELEASE_TABLET | Freq: Every day | ORAL | Status: DC
  Filled 2015-04-05: qty 1

## 2015-04-05 MED ORDER — SODIUM CHLORIDE 0.9 % IV SOLN
25.0000 ug/h | INTRAVENOUS | Status: DC
  Administered 2015-04-05: 25 ug/h via INTRAVENOUS
  Administered 2015-04-06: 125 ug/h via INTRAVENOUS
  Administered 2015-04-07 – 2015-04-08 (×2): 100 ug/h via INTRAVENOUS
  Administered 2015-04-10 – 2015-04-11 (×2): 125 ug/h via INTRAVENOUS
  Administered 2015-04-12 – 2015-04-14 (×4): 150 ug/h via INTRAVENOUS
  Administered 2015-04-15 – 2015-04-17 (×4): 250 ug/h via INTRAVENOUS
  Administered 2015-04-18: 150 ug/h via INTRAVENOUS
  Filled 2015-04-05 (×20): qty 50

## 2015-04-05 MED ORDER — ONDANSETRON HCL 4 MG PO TABS: 4.0000 mg | ORAL_TABLET | Freq: Four times a day (QID) | ORAL | Status: DC | PRN

## 2015-04-05 MED ORDER — SUCCINYLCHOLINE CHLORIDE 20 MG/ML IJ SOLN
INTRAMUSCULAR | Status: DC | PRN
  Administered 2015-04-05: 120 mg via INTRAVENOUS

## 2015-04-05 MED ORDER — ONDANSETRON HCL 4 MG/2ML IJ SOLN
4.0000 mg | Freq: Four times a day (QID) | INTRAMUSCULAR | Status: DC | PRN
  Administered 2015-05-02 – 2015-05-07 (×4): 4 mg via INTRAVENOUS
  Filled 2015-04-05 (×4): qty 2

## 2015-04-05 MED ORDER — POTASSIUM CHLORIDE IN NACL 20-0.9 MEQ/L-% IV SOLN
INTRAVENOUS | Status: DC
  Administered 2015-04-05 – 2015-04-06 (×2): via INTRAVENOUS
  Administered 2015-04-07 – 2015-04-08 (×3): 1000 mL via INTRAVENOUS
  Administered 2015-04-11 – 2015-04-16 (×3): via INTRAVENOUS
  Filled 2015-04-05 (×17): qty 1000

## 2015-04-05 MED ORDER — PROPOFOL 1000 MG/100ML IV EMUL
5.0000 ug/kg/min | INTRAVENOUS | Status: DC
  Administered 2015-04-05 – 2015-04-08 (×22): 50 ug/kg/min via INTRAVENOUS
  Administered 2015-04-09 (×6): 60 ug/kg/min via INTRAVENOUS
  Administered 2015-04-09: 50 ug/kg/min via INTRAVENOUS
  Administered 2015-04-10 – 2015-04-11 (×5): 45 ug/kg/min via INTRAVENOUS
  Administered 2015-04-14 (×2): 50 ug/kg/min via INTRAVENOUS
  Administered 2015-04-14: 5 ug/kg/min via INTRAVENOUS
  Administered 2015-04-14 – 2015-04-15 (×2): 60 ug/kg/min via INTRAVENOUS
  Administered 2015-04-15 (×2): 70 ug/kg/min via INTRAVENOUS
  Administered 2015-04-15 – 2015-04-16 (×3): 60 ug/kg/min via INTRAVENOUS
  Administered 2015-04-16: 65 ug/kg/min via INTRAVENOUS
  Administered 2015-04-16 (×4): 60 ug/kg/min via INTRAVENOUS
  Administered 2015-04-17: 65 ug/kg/min via INTRAVENOUS
  Administered 2015-04-17: 60 ug/kg/min via INTRAVENOUS
  Administered 2015-04-17: 35 ug/kg/min via INTRAVENOUS
  Administered 2015-04-18: 50 ug/kg/min via INTRAVENOUS
  Administered 2015-04-18 (×2): 45 ug/kg/min via INTRAVENOUS
  Administered 2015-04-18 – 2015-04-20 (×7): 50 ug/kg/min via INTRAVENOUS
  Filled 2015-04-05 (×66): qty 100

## 2015-04-05 MED ORDER — CHLORHEXIDINE GLUCONATE 0.12% ORAL RINSE (MEDLINE KIT): 15.0000 mL | Freq: Two times a day (BID) | OROMUCOSAL | Status: DC

## 2015-04-05 MED ORDER — SODIUM CHLORIDE 0.9 % IV SOLN
INTRAVENOUS | Status: DC | PRN
  Administered 2015-04-05 (×3): 1000 mL via INTRAVENOUS

## 2015-04-05 MED ORDER — CHLORHEXIDINE GLUCONATE 0.12% ORAL RINSE (MEDLINE KIT)
15.0000 mL | Freq: Two times a day (BID) | OROMUCOSAL | Status: DC
  Administered 2015-04-05 – 2015-05-08 (×67): 15 mL via OROMUCOSAL

## 2015-04-05 MED ORDER — MIDAZOLAM HCL 2 MG/2ML IJ SOLN
INTRAMUSCULAR | Status: AC
  Filled 2015-04-05: qty 2

## 2015-04-05 MED ORDER — LIDOCAINE HCL (CARDIAC) 20 MG/ML IV SOLN
INTRAVENOUS | Status: AC
  Filled 2015-04-05: qty 5

## 2015-04-05 MED ORDER — MORPHINE SULFATE (PF) 2 MG/ML IV SOLN: 1.0000 mg | INTRAVENOUS | Status: DC | PRN

## 2015-04-05 MED ORDER — ANTISEPTIC ORAL RINSE SOLUTION (CORINZ)
7.0000 mL | OROMUCOSAL | Status: DC
  Administered 2015-04-05 – 2015-05-08 (×326): 7 mL via OROMUCOSAL

## 2015-04-05 MED ORDER — ANTISEPTIC ORAL RINSE SOLUTION (CORINZ): 7.0000 mL | Freq: Four times a day (QID) | OROMUCOSAL | Status: DC

## 2015-04-05 MED ORDER — ROCURONIUM BROMIDE 100 MG/10ML IV SOLN
INTRAVENOUS | Status: DC | PRN
  Administered 2015-04-05: 50 mg via INTRAVENOUS

## 2015-04-05 MED ORDER — PIPERACILLIN-TAZOBACTAM 3.375 G IVPB
3.3750 g | Freq: Three times a day (TID) | INTRAVENOUS | Status: DC
  Administered 2015-04-05 – 2015-04-11 (×19): 3.375 g via INTRAVENOUS
  Filled 2015-04-05 (×21): qty 50

## 2015-04-05 MED ORDER — PANTOPRAZOLE SODIUM 40 MG IV SOLR
40.0000 mg | Freq: Every day | INTRAVENOUS | Status: DC
  Administered 2015-04-05 – 2015-04-18 (×13): 40 mg via INTRAVENOUS
  Filled 2015-04-05 (×13): qty 40

## 2015-04-05 NOTE — Progress Notes (Signed)
Patient ID: [name], male   DOB: Aug 16, 1965, 49 y.o.   MRN: [id number] 3 lateral c spine were done but we were unable to see c7 or t1.  Will try a c-arm in am. The ray showed reduction of c4-5. Family its talking about transferring him to [facility]. They will decide in am and let dr Hulen Skains know.

## 2015-04-05 NOTE — Progress Notes (Signed)
Orthopedic Tech Progress Note Patient Details:  [name] 1965/09/14 [id number] Set up cervical traction frame on pt.'s bed. Patient ID: [name], male   DOB: 1965/11/02, 49 y.o.   MRN: [id number]   Darrol Poke 04/05/2015, 3:16 AM
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
## 2015-04-05 NOTE — Progress Notes (Signed)
Met with pt's entire family, at their request.  Wife states several family members need notes for work, as they were absent today.  She states she will be providing FMLA /disability papers next week for completion.  Provided four letters for wife and additional family members.  Will complete FMLA paperwork when available.  Will follow/offer emotional support.  Reinaldo Raddle, RN, BSN  Trauma/Neuro ICU Case Manager [phone]
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
## 2015-04-05 NOTE — Progress Notes (Signed)
Discussed with Md about removing pts foley catheter. Md stated to keep it for now. Will continue to clean and monitor and will assess for removal readiness.
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
## 2015-04-05 NOTE — Progress Notes (Signed)
Attempted to meet with family to discuss needed documentation for work, FMLA, etc.  No family in room presently, and none in waiting room.  Left message for bedside nurse to notify this case manager when family returns.  Will follow up.  Reinaldo Raddle, RN, BSN  Trauma/Neuro ICU Case Manager [phone]
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
## 2015-04-05 NOTE — ED Notes (Signed)
Pt transported to CT by Merrily Brittle, and Marlou Starks, MD
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
## 2015-04-05 NOTE — ED Notes (Signed)
Family at bedside. 

## 2015-04-05 NOTE — Progress Notes (Signed)
Had a long conversation with the family about the patient's current condition, the extent of the injury, the likelihood and extent of recovery, and the long term outlook.  This is a devastating injury, and recovery beyond incomplete quadriplegia is unlikely.  The neurosurgeon will come to see the family and discuss when surgery will be needed, but there is no urgency.  The patient has a significant hematoma of the neck and a lot of inflammation and swelling.  We will continue to support him hemodynamically and work on progression towards eventually rehab.  I do not believe that the patient will need long term ventilation because the respiratory distress that he has is likely due to swelling and edema of the cord above the level of transection--I would expect that this will improve over time.  [name]. Dahlia Bailiff, MD, [place] [phone] Trauma Surgeon

## 2015-04-05 NOTE — ED Notes (Signed)
Neurology has been consulted.

## 2015-04-05 NOTE — ED Notes (Signed)
7.5 ett placed by anesthesia and 23 at the lip. Pt was given 40 mg of rocuronium
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
## 2015-04-05 NOTE — ED Notes (Signed)
Priapism noted.

## 2015-04-05 NOTE — Anesthesia Procedure Notes (Signed)
Procedure Name: Intubation Date/Time: 04/05/2015 1:53 AM Performed by: Manuela Schwartz B Pre-anesthesia Checklist: Patient identified, Emergency Drugs available, Suction available, Patient being monitored and Timeout performed Patient Re-evaluated:Patient Re-evaluated prior to inductionOxygen Delivery Method: Circle system utilized Preoxygenation: Pre-oxygenation with 100% oxygen Intubation Type: IV induction, Rapid sequence and Cricoid Pressure applied Grade View: Grade I Tube type: Subglottic suction tube Tube size: 7.5 mm Number of attempts: 1 Airway Equipment and Method: Stylet and Video-laryngoscopy (cervical spine maintained in neutral position with traction,) Placement Confirmation: ETT inserted through vocal cords under direct vision,  breath sounds checked- equal and bilateral and CO2 detector Secured at: 24 (at lip) cm Tube secured with: Tape (tube secured by resp therapist) Dental Injury: Teeth and Oropharynx as per pre-operative assessment
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
## 2015-04-05 NOTE — Progress Notes (Signed)
Utilization review completed. Shoichi Mielke, RN, BSN. 

## 2015-04-05 NOTE — ED Notes (Addendum)
Attempted intubation by dr Orene Desanctis and crna, meds given by dr Orene Desanctis and not verbalized to this rn what was administered

## 2015-04-05 NOTE — ED Notes (Signed)
Given earring to daughter

## 2015-04-05 NOTE — Progress Notes (Signed)
Found pt on 663ml vt which is 8cc.

## 2015-04-05 NOTE — ED Notes (Signed)
2L normal saline started per Alfonse Spruce, MD.
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
## 2015-04-05 NOTE — Progress Notes (Signed)
Patient ID: [name], male   DOB: 12-08-1965, 49 y.o.   MRN: [id number] With 15 lb of traction the lateral spine ray shows better aligment. See note about further care
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
## 2015-04-05 NOTE — Progress Notes (Signed)
Patient ID: [name], male   DOB: 05-21-1965, 49 y.o.   MRN: [id number] Consult note dictated. Several injuries to the cervical spine the worse being the fracture dislocation at c7-t1, fracture dislocation at c45 with some widening of the facets. Prior condition of the spine such as dish or AK. SPOKE WITH THE FAMILY. AGREE TO BE INTUBATED AND TRACTION APPLIED TO THE CERVIACAL SPINE. POOR PROGNOSIS FOR neurological improvement

## 2015-04-06 ENCOUNTER — Inpatient Hospital Stay (HOSPITAL_COMMUNITY): Payer: BLUE CROSS/BLUE SHIELD

## 2015-04-06 LAB — BASIC METABOLIC PANEL
Anion gap: 7 (ref 5–15)
BUN: 7 mg/dL (ref 6–20)
CALCIUM: 8.9 mg/dL (ref 8.9–10.3)
CO2: 25 mmol/L (ref 22–32)
CREATININE: 0.86 mg/dL (ref 0.61–1.24)
Chloride: 106 mmol/L (ref 101–111)
GFR calc Af Amer: >60 mL/min (ref >60)
GFR calc non Af Amer: >60 mL/min (ref >60)
GLUCOSE: 107 mg/dL — AB (ref 65–99)
Potassium: 3.7 mmol/L (ref 3.5–5.1)
Sodium: 138 mmol/L (ref 135–145)

## 2015-04-06 LAB — CBC WITH DIFFERENTIAL/PLATELET
BASOS ABS: 0 10*3/uL (ref 0.0–0.1)
Basophils Relative: 0 %
EOS ABS: 0.1 10*3/uL (ref 0.0–0.7)
EOS PCT: 1 %
HEMATOCRIT: 33 % — AB (ref 39.0–52.0)
Hemoglobin: 11.4 g/dL — ABNORMAL LOW (ref 13.0–17.0)
LYMPHS PCT: 15 %
Lymphs Abs: 1.4 10*3/uL (ref 0.7–4.0)
MCH: 29.3 pg (ref 26.0–34.0)
MCHC: 34.5 g/dL (ref 30.0–36.0)
MCV: 84.8 fL (ref 78.0–100.0)
MONO ABS: 1 10*3/uL (ref 0.1–1.0)
Monocytes Relative: 11 %
Neutro Abs: 6.6 10*3/uL (ref 1.7–7.7)
Neutrophils Relative %: 73 %
Platelets: 259 10*3/uL (ref 150–400)
RBC: 3.89 MIL/uL — ABNORMAL LOW (ref 4.22–5.81)
RDW: 14.4 % (ref 11.5–15.5)
WBC: 9 10*3/uL (ref 4.0–10.5)

## 2015-04-06 LAB — CDS SEROLOGY

## 2015-04-06 MED ORDER — SODIUM CHLORIDE 0.9 % IJ SOLN: 10.0000 mL | INTRAMUSCULAR | Status: DC | PRN

## 2015-04-06 MED ORDER — PIVOT 1.5 CAL PO LIQD
1000.0000 mL | ORAL | Status: DC
  Filled 2015-04-06: qty 1000

## 2015-04-06 MED ORDER — SODIUM CHLORIDE 0.9 % IJ SOLN
10.0000 mL | Freq: Two times a day (BID) | INTRAMUSCULAR | Status: DC
  Administered 2015-04-06 – 2015-04-12 (×13): 10 mL
  Administered 2015-04-12: 30 mL
  Administered 2015-04-13 – 2015-04-16 (×8): 10 mL
  Administered 2015-04-17: 30 mL
  Administered 2015-04-18 – 2015-04-23 (×10): 10 mL
  Administered 2015-04-23: 20 mL
  Administered 2015-04-24: 10 mL
  Administered 2015-04-25: 20 mL
  Administered 2015-04-25 – 2015-05-02 (×8): 10 mL
  Administered 2015-05-03: 20 mL
  Administered 2015-05-04 – 2015-05-08 (×8): 10 mL

## 2015-04-06 MED ORDER — ACETAMINOPHEN 650 MG RE SUPP
650.0000 mg | Freq: Four times a day (QID) | RECTAL | Status: DC | PRN
  Administered 2015-04-06: 650 mg via RECTAL
  Filled 2015-04-06: qty 1

## 2015-04-06 MED ORDER — PRO-STAT SUGAR FREE PO LIQD
60.0000 mL | Freq: Two times a day (BID) | ORAL | Status: DC
  Administered 2015-04-06 – 2015-04-12 (×12): 60 mL
  Filled 2015-04-06 (×12): qty 60

## 2015-04-06 MED ORDER — PIVOT 1.5 CAL PO LIQD
1000.0000 mL | ORAL | Status: DC
  Administered 2015-04-06 – 2015-04-12 (×5): 1000 mL
  Filled 2015-04-06 (×7): qty 1000

## 2015-04-06 NOTE — Progress Notes (Signed)
Attempted weaning trial (per MD no plans for extubation), however low RR and low vT, low min. Volume.  MD aware.

## 2015-04-06 NOTE — Progress Notes (Signed)
OT NOTE  OT order received for UE / LE splinting. OT speaking with RN Doroteo Bradford and recommend having ortho tech/ BIOTECH order placed for resting hand splints and prafo for BIL LE at this time. OT to continue to follow with pending surgery next week. OT to assess fabricated splints after surg and removal of vent.  Ot to follow acutely until a more appropriate time.   Jeri Modena   OTR/L Pager: [phone] Office: [phone] .

## 2015-04-06 NOTE — Clinical Documentation Improvement (Signed)
Trauma  Can the diagnosis of Respiratory Distress be further specified?        Respiratory Failure  Document Acuity - Acute, Chronic, Acute on Chronic  Document Inclusion Of - Hypoxia, Hypercapnia, Combination of Both  Document Tobacco - Use, Abuse, History Of  Other  Clinically Undetermined  Document any associated diagnoses/conditions. Please update your documentation within the medical record to reflect your response to this query. Thank you.  Supporting Information : (As per notes) "I do not believe that the patient will need long term ventilation because the respiratory distress that he has is likely due to swelling and edema of the cord above the level of transection--I would expect that this will improve over time."  Pt intubated on ventilator  Labs: Component     Latest Ref Rng 04/05/2015  pH, Arterial     7.350 - 7.450 7.343 (L)  pCO2 arterial     35.0 - 45.0 mmHg 35.2  pO2, Arterial     80.0 - 100.0 mmHg 65.0 (L)  Bicarbonate     20.0 - 24.0 mEq/L 19.8 (L)  TCO2     0 - 100 mmol/L 21  O2 Saturation      94.0  Acid-base deficit     0.0 - 2.0 mmol/L 6.0 (H)    Please exercise your independent, professional judgment when responding. A specific answer is not anticipated or expected.  Thank You, Alessandra Grout, RN, BSN, CCDS,Clinical Documentation Specialist:  [phone]  [phone]=[facility] [place]- Health Information Management

## 2015-04-06 NOTE — Progress Notes (Signed)
Patient ID: [name], male   DOB: 1965-06-10, 49 y.o.   MRN: [id number] Stable. Moves both arms and open eyes to commands. Now on 20 lbs of traction. To get a lateral c spine ray

## 2015-04-06 NOTE — Progress Notes (Signed)
Peripherally Inserted Central Catheter/Midline Placement  The IV Nurse has discussed with the patient and/or persons authorized to consent for the patient, the purpose of this procedure and the potential benefits and risks involved with this procedure.  The benefits include less needle sticks, lab draws from the catheter and patient may be discharged home with the catheter.  Risks include, but not limited to, infection, bleeding, blood clot (thrombus formation), and puncture of an artery; nerve damage and irregular heat beat.  Alternatives to this procedure were also discussed.  PICC/Midline Placement Documentation  PICC Triple Lumen [id number] PICC Right Basilic 41 cm 0 cm (Active)  Indication for Insertion or Continuance of Line Vasoactive infusions;Prolonged intravenous therapies 04/06/2015 10:34 AM  Exposed Catheter (cm) 0 cm 04/06/2015 10:34 AM  Site Assessment Clean;Dry;Intact 04/06/2015 10:34 AM  Lumen #1 Status Flushed;Saline locked;Blood return noted 04/06/2015 10:34 AM  Lumen #2 Status Flushed;Saline locked;Blood return noted 04/06/2015 10:34 AM  Lumen #3 Status Flushed;Saline locked;Blood return noted 04/06/2015 10:34 AM  Dressing Type Transparent 04/06/2015 10:34 AM  Dressing Change Due 04/13/15 04/06/2015 10:34 AM       Gordan Payment 04/06/2015, 10:35 AM

## 2015-04-06 NOTE — Progress Notes (Signed)
Orthopedic Tech Progress Note Patient Details:  [name] 1966/04/02 [id number]  Ortho Devices Type of Ortho Device: Postop shoe/boot Ortho Device/Splint Location: b le prafo boots Ortho Device/Splint Interventions: Ordered, Application   Karolee Stamps 04/06/2015, 7:06 PM
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
## 2015-04-06 NOTE — Progress Notes (Addendum)
Notified MD Ninfa Linden of patient's temp of 102.0. Orders received for Tylenol 650mg  suppositoy Q6 PRN for fever greater than 101.5. Will continue to monitor.

## 2015-04-07 ENCOUNTER — Inpatient Hospital Stay (HOSPITAL_COMMUNITY): Payer: BLUE CROSS/BLUE SHIELD

## 2015-04-07 DIAGNOSIS — S2220XA Unspecified fracture of sternum, initial encounter for closed fracture: Secondary | ICD-10-CM | POA: Diagnosis present

## 2015-04-07 DIAGNOSIS — D62 Acute posthemorrhagic anemia: Secondary | ICD-10-CM | POA: Diagnosis not present

## 2015-04-07 DIAGNOSIS — J9600 Acute respiratory failure, unspecified whether with hypoxia or hypercapnia: Secondary | ICD-10-CM | POA: Diagnosis present

## 2015-04-07 DIAGNOSIS — G8254 Quadriplegia, C5-C7 incomplete: Secondary | ICD-10-CM | POA: Diagnosis present

## 2015-04-07 DIAGNOSIS — F10929 Alcohol use, unspecified with intoxication, unspecified: Secondary | ICD-10-CM | POA: Diagnosis present

## 2015-04-07 LAB — CBC WITH DIFFERENTIAL/PLATELET
BASOS PCT: 0 %
Basophils Absolute: 0 10*3/uL (ref 0.0–0.1)
EOS ABS: 0.2 10*3/uL (ref 0.0–0.7)
Eosinophils Relative: 2 %
HCT: 31.9 % — ABNORMAL LOW (ref 39.0–52.0)
HEMOGLOBIN: 11.1 g/dL — AB (ref 13.0–17.0)
Lymphocytes Relative: 13 %
Lymphs Abs: 1.3 10*3/uL (ref 0.7–4.0)
MCH: 29.2 pg (ref 26.0–34.0)
MCHC: 34.8 g/dL (ref 30.0–36.0)
MCV: 83.9 fL (ref 78.0–100.0)
MONOS PCT: 8 %
Monocytes Absolute: 0.7 10*3/uL (ref 0.1–1.0)
NEUTROS PCT: 77 %
Neutro Abs: 7.7 10*3/uL (ref 1.7–7.7)
PLATELETS: 246 10*3/uL (ref 150–400)
RBC: 3.8 MIL/uL — ABNORMAL LOW (ref 4.22–5.81)
RDW: 14.2 % (ref 11.5–15.5)
WBC: 9.8 10*3/uL (ref 4.0–10.5)

## 2015-04-07 LAB — BASIC METABOLIC PANEL
Anion gap: 7 (ref 5–15)
BUN: 10 mg/dL (ref 6–20)
CALCIUM: 8.8 mg/dL — AB (ref 8.9–10.3)
CHLORIDE: 107 mmol/L (ref 101–111)
CO2: 25 mmol/L (ref 22–32)
CREATININE: 0.75 mg/dL (ref 0.61–1.24)
Glucose, Bld: 120 mg/dL — ABNORMAL HIGH (ref 65–99)
Potassium: 4 mmol/L (ref 3.5–5.1)
SODIUM: 139 mmol/L (ref 135–145)

## 2015-04-07 MED ORDER — ENOXAPARIN SODIUM 40 MG/0.4ML ~~LOC~~ SOLN
40.0000 mg | SUBCUTANEOUS | Status: DC
  Administered 2015-04-07 – 2015-04-23 (×15): 40 mg via SUBCUTANEOUS
  Filled 2015-04-07 (×15): qty 0.4

## 2015-04-07 MED ORDER — WHITE PETROLATUM GEL
Status: AC
  Administered 2015-04-07: 0.2
  Filled 2015-04-07: qty 1

## 2015-04-07 MED ORDER — BISACODYL 10 MG RE SUPP
10.0000 mg | Freq: Every day | RECTAL | Status: DC
  Administered 2015-04-07 – 2015-04-09 (×3): 10 mg via RECTAL
  Filled 2015-04-07 (×7): qty 1
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
## 2015-04-08 ENCOUNTER — Inpatient Hospital Stay (HOSPITAL_COMMUNITY): Payer: BLUE CROSS/BLUE SHIELD

## 2015-04-08 LAB — POCT I-STAT 3, ART BLOOD GAS (G3+)
BICARBONATE: 24.4 meq/L — AB (ref 20.0–24.0)
O2 Saturation: 98 %
PCO2 ART: 39.1 mmHg (ref 35.0–45.0)
PH ART: 7.404 (ref 7.350–7.450)
PO2 ART: 107 mmHg — AB (ref 80.0–100.0)
Patient temperature: 98.5
TCO2: 26 mmol/L (ref 0–100)

## 2015-04-08 LAB — TRIGLYCERIDES: Triglycerides: 279 mg/dL — ABNORMAL HIGH (ref <150)

## 2015-04-08 MED ORDER — CHLORHEXIDINE GLUCONATE 0.12 % MT SOLN
OROMUCOSAL | Status: AC
  Administered 2015-04-08: 15 mL via OROMUCOSAL
  Filled 2015-04-08: qty 15

## 2015-04-08 MED ORDER — VANCOMYCIN HCL IN DEXTROSE 1-5 GM/200ML-% IV SOLN
1000.0000 mg | Freq: Three times a day (TID) | INTRAVENOUS | Status: DC
  Administered 2015-04-08 – 2015-04-10 (×6): 1000 mg via INTRAVENOUS
  Filled 2015-04-08 (×9): qty 200

## 2015-04-08 MED ORDER — VANCOMYCIN HCL 10 G IV SOLR
1500.0000 mg | Freq: Once | INTRAVENOUS | Status: AC
  Administered 2015-04-08: 1500 mg via INTRAVENOUS
  Filled 2015-04-08: qty 1500
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
## 2015-04-08 NOTE — Progress Notes (Signed)
Orthopedic Tech Progress Note Patient Details:  [name] 1966/03/29 [id number]  Ortho Devices Type of Ortho Device: Knee Immobilizer Ortho Device/Splint Location: lle Ortho Device/Splint Interventions: Ordered, Application   Karolee Stamps 04/08/2015, 10:29 PM

## 2015-04-08 NOTE — Progress Notes (Signed)
Subjective: Patient reports intubated.  Follows commands.  Objective: Vital signs in last 24 hours: Temp:  [97.1 F (36.2 C)-98.4 F (36.9 C)] 97.1 F (36.2 C) (01/01 0727) Pulse Rate:  [67-86] 86 (01/01 0800) Resp:  [14-18] 16 (01/01 0800) BP: (83-114)/(57-86) 114/86 mmHg (01/01 0800) SpO2:  [86 %-100 %] 98 % (01/01 0800) FiO2 (%):  [30 %-60 %] 60 % (01/01 0706)  Intake/Output from previous day: 12/31 0701 - 01/01 0700 In: 2998.4 [I.V.:2128.4; NG/GT:720; IV Piggyback:150] Out: 1575 [Urine:1575] Intake/Output this shift: Total I/O In: 113.1 [I.V.:83.1; NG/GT:30] Out: 100 [Urine:100]  Physical Exam: No hand movement or leg movement either side.  Good bilateral biceps, triceps strength and some wrist movement bilaterally.  In traction.  Lab Results:  Recent Labs  04/06/15 1315 04/07/15 0455  WBC 9.0 9.8  HGB 11.4* 11.1*  HCT 33.0* 31.9*  PLT 259 246   BMET  Recent Labs  04/06/15 1315 04/07/15 0455  NA 138 139  K 3.7 4.0  CL 106 107  CO2 25 25  GLUCOSE 107* 120*  BUN 7 10  CREATININE 0.86 0.75  CALCIUM 8.9 8.8*    Studies/Results: Dg Cervical Spine 1 View  04/06/2015  CLINICAL DATA:  Fracture.  Evaluate C7-T1 EXAM: CERVICAL SPINE 1 VIEW COMPARISON:  04/05/2015 FINDINGS: Several cross-table lateral views of the cervical spine are submitted. Unable to confidently identify the C7-T1 level on these intraoperative images. IMPRESSION: Intraoperative imaging, unable to confidently identify the cervicothoracic junction. Electronically Signed   By: Rolm Baptise M.D.   On: 04/06/2015 12:30   Dg Chest Port 1 View  04/08/2015  CLINICAL DATA:  Acute respiratory failure. EXAM: PORTABLE CHEST 1 VIEW COMPARISON:  04/07/2015 FINDINGS: Nasogastric tube is in place with tip off the film beyond the gastroesophageal junction. Endotracheal tube is in place with tip 5.9 cm above the carina. Right-sided PICC line tip overlies the level of the lower superior vena cava. Extreme lung  bases are excluded. There is mild perihilar atelectasis. No focal consolidations or definite pleural effusions. IMPRESSION: 1. Bilateral perihilar atelectasis. 2. No significant change in lines and tubes. Electronically Signed   By: Nolon Nations M.D.   On: 04/08/2015 08:57   Dg Chest Port 1 View  04/07/2015  CLINICAL DATA:  Chest trauma and fever EXAM: PORTABLE CHEST 1 VIEW COMPARISON:  Yesterday FINDINGS: Endotracheal and NG tubes are stable. Lungs remain clear. Normal heart size. No pneumothorax. IMPRESSION: Stable exam with clear lungs.  Stable support apparatus. Electronically Signed   By: Marybelle Killings M.D.   On: 04/07/2015 08:14    Assessment/Plan: Continue cervical traction per Dr. Joya Salm.  Cervical spine stabilization per Dr. Joya Salm.  Will repeat cervical X ray tomorrow to assure maintained reduction.    LOS: 3 days    Peggyann Shoals, MD 04/08/2015, 9:01 AM
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
## 2015-04-08 NOTE — Progress Notes (Signed)
Pt transferred to rotating bed per Hulen Skains, MD orders.  Ortho tech managed cervical traction set up, RT managed ventilator during transfer.  Pt tolerated procedure well.  Bed set to q15 minute side to side rotation.

## 2015-04-09 ENCOUNTER — Inpatient Hospital Stay (HOSPITAL_COMMUNITY): Payer: BLUE CROSS/BLUE SHIELD

## 2015-04-09 DIAGNOSIS — M2392 Unspecified internal derangement of left knee: Secondary | ICD-10-CM | POA: Diagnosis present

## 2015-04-09 LAB — BASIC METABOLIC PANEL
ANION GAP: 7 (ref 5–15)
BUN: 15 mg/dL (ref 6–20)
CALCIUM: 9.1 mg/dL (ref 8.9–10.3)
CHLORIDE: 109 mmol/L (ref 101–111)
CO2: 26 mmol/L (ref 22–32)
Creatinine, Ser: 0.65 mg/dL (ref 0.61–1.24)
GFR calc non Af Amer: >60 mL/min (ref >60)
Glucose, Bld: 113 mg/dL — ABNORMAL HIGH (ref 65–99)
Potassium: 4.2 mmol/L (ref 3.5–5.1)
Sodium: 142 mmol/L (ref 135–145)

## 2015-04-09 LAB — CBC WITH DIFFERENTIAL/PLATELET
BASOS ABS: 0 10*3/uL (ref 0.0–0.1)
BASOS PCT: 0 %
Eosinophils Absolute: 0.4 10*3/uL (ref 0.0–0.7)
Eosinophils Relative: 5 %
HEMATOCRIT: 29 % — AB (ref 39.0–52.0)
HEMOGLOBIN: 9.5 g/dL — AB (ref 13.0–17.0)
Lymphocytes Relative: 11 %
Lymphs Abs: 0.9 10*3/uL (ref 0.7–4.0)
MCH: 27.5 pg (ref 26.0–34.0)
MCHC: 32.8 g/dL (ref 30.0–36.0)
MCV: 83.8 fL (ref 78.0–100.0)
Monocytes Absolute: 0.9 10*3/uL (ref 0.1–1.0)
Monocytes Relative: 11 %
NEUTROS ABS: 5.9 10*3/uL (ref 1.7–7.7)
NEUTROS PCT: 73 %
Platelets: 305 10*3/uL (ref 150–400)
RBC: 3.46 MIL/uL — ABNORMAL LOW (ref 4.22–5.81)
RDW: 14.1 % (ref 11.5–15.5)
WBC: 8.1 10*3/uL (ref 4.0–10.5)

## 2015-04-09 MED ORDER — ACETAMINOPHEN 160 MG/5ML PO SOLN
650.0000 mg | Freq: Four times a day (QID) | ORAL | Status: DC | PRN
  Administered 2015-04-09 – 2015-04-18 (×11): 650 mg via ORAL
  Filled 2015-04-09 (×12): qty 20.3

## 2015-04-09 MED ORDER — FUROSEMIDE 10 MG/ML PO SOLN
20.0000 mg | Freq: Every day | ORAL | Status: DC
  Administered 2015-04-09 – 2015-04-15 (×7): 20 mg via ORAL
  Filled 2015-04-09 (×10): qty 2

## 2015-04-09 MED ORDER — FUROSEMIDE 8 MG/ML PO SOLN
20.0000 mg | Freq: Every day | ORAL | Status: DC
  Filled 2015-04-09: qty 5

## 2015-04-09 NOTE — Progress Notes (Signed)
Pt remains on vent with cervical traction in place.  Plan for surgery with Dr. Joya Salm this week, with plans to extubate after surgery per MD notes.  Will continue to follow progress.    Reinaldo Raddle, RN, BSN  Trauma/Neuro ICU Case Manager [phone]
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
## 2015-04-09 NOTE — Progress Notes (Signed)
Patient ID: [name], male   DOB: 12-07-1965, 50 y.o.   MRN: [id number] Neuro stable with motor function a c6 level. On 20 lbs of traction. c45 fracture reduced. c7t1 difficult to see. Radiologist advised a ct myelogram. He is intubated and on cervical traction. Will talk to x-ray about  The logisyics of doing the test and risks /benefits.  To go up to 25 lbas of traction

## 2015-04-10 ENCOUNTER — Inpatient Hospital Stay (HOSPITAL_COMMUNITY): Payer: BLUE CROSS/BLUE SHIELD

## 2015-04-10 LAB — CBC
HCT: 29.1 % — ABNORMAL LOW (ref 39.0–52.0)
HEMOGLOBIN: 9.8 g/dL — AB (ref 13.0–17.0)
MCH: 27.8 pg (ref 26.0–34.0)
MCHC: 33.7 g/dL (ref 30.0–36.0)
MCV: 82.4 fL (ref 78.0–100.0)
Platelets: 310 10*3/uL (ref 150–400)
RBC: 3.53 MIL/uL — ABNORMAL LOW (ref 4.22–5.81)
RDW: 14 % (ref 11.5–15.5)
WBC: 9.1 10*3/uL (ref 4.0–10.5)

## 2015-04-10 LAB — BASIC METABOLIC PANEL
Anion gap: 10 (ref 5–15)
BUN: 15 mg/dL (ref 6–20)
CHLORIDE: 105 mmol/L (ref 101–111)
CO2: 27 mmol/L (ref 22–32)
CREATININE: 1.01 mg/dL (ref 0.61–1.24)
Calcium: 9.2 mg/dL (ref 8.9–10.3)
GFR calc Af Amer: >60 mL/min (ref >60)
GFR calc non Af Amer: >60 mL/min (ref >60)
GLUCOSE: 117 mg/dL — AB (ref 65–99)
Potassium: 3.6 mmol/L (ref 3.5–5.1)
SODIUM: 142 mmol/L (ref 135–145)

## 2015-04-10 LAB — VANCOMYCIN, TROUGH: Vancomycin Tr: 10 ug/mL (ref 10.0–20.0)

## 2015-04-10 MED ORDER — VANCOMYCIN HCL 10 G IV SOLR
1250.0000 mg | Freq: Three times a day (TID) | INTRAVENOUS | Status: DC
  Administered 2015-04-10 – 2015-04-15 (×14): 1250 mg via INTRAVENOUS
  Filled 2015-04-10 (×16): qty 1250

## 2015-04-10 MED ORDER — IPRATROPIUM-ALBUTEROL 0.5-2.5 (3) MG/3ML IN SOLN
3.0000 mL | Freq: Four times a day (QID) | RESPIRATORY_TRACT | Status: DC
  Administered 2015-04-10 – 2015-04-19 (×35): 3 mL via RESPIRATORY_TRACT
  Filled 2015-04-10 (×19): qty 3
  Filled 2015-04-10: qty 42
  Filled 2015-04-10 (×15): qty 3
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
## 2015-04-10 NOTE — Progress Notes (Signed)
Patient ID: [name], male   DOB: Jun 23, 1965, 50 y.o.   MRN: [id number] Neuro stable. 25 lbs of traction. To get a lateral c spine ray

## 2015-04-10 NOTE — Progress Notes (Signed)
Pharmacy Antibiotic Follow-up Note  Assessment/Plan: [name] is a 50 y.o. year-old male admitted on 04/05/2015.  The patient is currently on day # 6 of Zosyn and day # 3 of vancomycin for suspected PNA. Afebrile and WBC down to wnl. SCr went up to 1.01 this morning, CrCl ~29ml/min. VT drawn appropriately today and was low at 10. SCr did go up so will check tomorrow and look at trend but will also increase dose slightly.  Plan: Increase vancomycin to 1.25g IV Q8 Continue Zosyn 3.375 gm IV q8h (4 hour infusion) Monitor clinical picture, renal function, VT at Css (1/5) F/U C&S, abx deescalation / LOT   Temp (24hrs), Avg:102 F (38.9 C), Min:100.3 F (37.9 C), Max:103.2 F (39.6 C)   Recent Labs Lab 04/05/15 0411 04/06/15 1315 04/07/15 0455 04/09/15 0407 04/10/15 0540  WBC 9.9 9.0 9.8 8.1 9.1    Recent Labs Lab 04/05/15 0411 04/06/15 1315 04/07/15 0455 04/09/15 0407 04/10/15 0900  CREATININE 0.70 0.86 0.75 0.65 1.01   Estimated Creatinine Clearance: 94.6 mL/min (by C-G formula based on Cr of 1.01).    Allergies  Allergen Reactions  . Other Hives    From work uniform  . Shellfish Allergy Swelling    Lip swelling    Antimicrobials this admission: 12/29 zosyn>> 1/1 vanc>>   Levels/dose changes this admission: 1/3 VT = 10 (drawn appropriately)  Microbiology results: 1/1 resp cx >> MRSA pcr neg  Thank you for allowing pharmacy to be a part of this patient's care.  Elenor Quinones, PharmD, BCPS Clinical Pharmacist Pager [phone] 04/10/2015 8:53 PM

## 2015-04-10 NOTE — Progress Notes (Signed)
Patient ID: [name], male   DOB: Aug 18, 1965, 50 y.o.   MRN: [id number]  I was called by RN due to desaturation. Lungs: more rhonchi on R, rub on L. PEEP increased and he was bagged and suctioned by RT. Sats now 100%. Check stat port CXR, start duonebs. Georganna Skeans, MD, MPH, FACS Trauma: [phone] General Surgery: [phone]

## 2015-04-10 NOTE — Progress Notes (Signed)
Patient ID: [name], male   DOB: 1965/08/24, 50 y.o.   MRN: [id number]   Neuro stable.  Better aligment with 25.  To add 5 more   cspine ray in am

## 2015-04-10 NOTE — Progress Notes (Signed)
RN noted desaturation to mid 80s. Pt given 100% 02, suctioned. Increased nasal/oral secretions but minimal via ETT. Sats would not rise above 89%. RT also attempted recruitment. MD called and came to bedside. Orders received. Chest xray done and MD visualized. Pt sats increased to 100%. Will continue to monitor.
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
## 2015-04-10 NOTE — Progress Notes (Signed)
Patient ID: [name], male   DOB: Apr 22, 1965, 50 y.o.   MRN: [id number] I spoke with his sister and daughter at the bedside and updated them on the plan of care. Georganna Skeans, MD, MPH, FACS Trauma: [phone] General Surgery: [phone]

## 2015-04-10 NOTE — Progress Notes (Signed)
Per Dr. Harley Hallmark order, added 5 lb sandbag to cervical traction for total of 30 lb and ordered repeat cervical spine xray for tomorrow morning.

## 2015-04-11 ENCOUNTER — Inpatient Hospital Stay (HOSPITAL_COMMUNITY): Payer: BLUE CROSS/BLUE SHIELD

## 2015-04-11 LAB — BASIC METABOLIC PANEL
Anion gap: 9 (ref 5–15)
BUN: 22 mg/dL — AB (ref 6–20)
CHLORIDE: 106 mmol/L (ref 101–111)
CO2: 28 mmol/L (ref 22–32)
CREATININE: 0.82 mg/dL (ref 0.61–1.24)
Calcium: 9.2 mg/dL (ref 8.9–10.3)
GFR calc non Af Amer: >60 mL/min (ref >60)
Glucose, Bld: 134 mg/dL — ABNORMAL HIGH (ref 65–99)
POTASSIUM: 3.9 mmol/L (ref 3.5–5.1)
Sodium: 143 mmol/L (ref 135–145)

## 2015-04-11 LAB — CBC
HEMATOCRIT: 29.3 % — AB (ref 39.0–52.0)
HEMOGLOBIN: 9.7 g/dL — AB (ref 13.0–17.0)
MCH: 27.7 pg (ref 26.0–34.0)
MCHC: 33.1 g/dL (ref 30.0–36.0)
MCV: 83.7 fL (ref 78.0–100.0)
Platelets: 314 10*3/uL (ref 150–400)
RBC: 3.5 MIL/uL — AB (ref 4.22–5.81)
RDW: 14.3 % (ref 11.5–15.5)
WBC: 11.2 10*3/uL — ABNORMAL HIGH (ref 4.0–10.5)

## 2015-04-11 LAB — TRIGLYCERIDES: Triglycerides: 165 mg/dL — ABNORMAL HIGH (ref <150)

## 2015-04-11 LAB — CULTURE, RESPIRATORY: SPECIAL REQUESTS: NORMAL

## 2015-04-11 LAB — CULTURE, RESPIRATORY W GRAM STAIN

## 2015-04-11 LAB — VANCOMYCIN, TROUGH: Vancomycin Tr: 17 ug/mL (ref 10.0–20.0)

## 2015-04-11 NOTE — Progress Notes (Signed)
Pharmacy Antibiotic Follow-up Note  Assessment/Plan: [name] is a 50 y.o. year-old male admitted on 04/05/2015.  The patient is currently on day # 4 of vancomycin for MRSA PNA. Afebrile and WBC slightly elevated at 11. SCr down to 0.8 this morning, CrCl >116ml/min. VT drawn appropriately today and is now at goal at 66.   Plan: Continue vancomycin to 1.25g IV Q8 Continue Zosyn 3.375 gm IV q8h (4 hour infusion) Monitor clinical picture, renal function, VT at Css (1/5) F/U C&S, abx deescalation / LOT   Temp (24hrs), Avg:100.2 F (37.9 C), Min:99.7 F (37.6 C), Max:100.9 F (38.3 C)   Recent Labs Lab 04/06/15 1315 04/07/15 0455 04/09/15 0407 04/10/15 0540 04/11/15 0445  WBC 9.0 9.8 8.1 9.1 11.2*     Recent Labs Lab 04/06/15 1315 04/07/15 0455 04/09/15 0407 04/10/15 0900 04/11/15 0445  CREATININE 0.86 0.75 0.65 1.01 0.82   Estimated Creatinine Clearance: 116.5 mL/min (by C-G formula based on Cr of 0.82).    Allergies  Allergen Reactions  . Other Hives    From work uniform  . Shellfish Allergy Swelling    Lip swelling    Antimicrobials this admission: 12/29 zosyn>>1/4 1/1 vanc>>   Levels/dose changes this admission: 1/3 VT = 10  1/4 VT = 17  Microbiology results: 1/1 resp cx >>MRSA (S -Vanc) MRSA pcr neg  Thank you for allowing pharmacy to be a part of this patient's care.  Erin Hearing PharmD., BCPS Clinical Pharmacist Pager [phone] 04/11/2015 9:24 PM

## 2015-04-11 NOTE — Progress Notes (Signed)
Patient ID: [name], male   DOB: 07-08-1965, 51 y.o.   MRN: [id number] Satble. Unable to see c7t1.  Continue traction. Spoke with family last night

## 2015-04-11 NOTE — Progress Notes (Signed)
Follow up - Trauma and Critical Care  Patient Details:    [name] is an 50 y.o. male.  Lines/tubes : Airway 7.5 mm (Active)  Secured at (cm) 27 cm 04/11/2015  7:28 AM  Measured From Lips 04/11/2015  7:28 AM  Secured Location Right 04/11/2015  7:28 AM  Secured By [employer] 04/11/2015  7:28 AM  Tube Holder Repositioned Yes 04/11/2015  7:28 AM  Cuff Pressure (cm H2O) 24 cm H2O 04/10/2015  7:02 PM  Site Condition Dry 04/11/2015  7:28 AM     PICC Triple Lumen [id number] PICC Right Basilic 41 cm 0 cm (Active)  Indication for Insertion or Continuance of Line Prolonged intravenous therapies 04/10/2015  8:00 PM  Exposed Catheter (cm) 0 cm 04/06/2015 10:34 AM  Site Assessment Clean;Intact;Dry 04/10/2015  8:00 PM  Lumen #1 Status Flushed;Blood return noted;Infusing 04/11/2015  2:00 AM  Lumen #2 Status Flushed;Blood return noted;Infusing 04/11/2015  2:00 AM  Lumen #3 Status Flushed 04/11/2015  2:00 AM  Dressing Type Transparent 04/11/2015  2:00 AM  Dressing Status Clean;Dry;Intact;Antimicrobial disc in place 04/10/2015  8:00 PM  Line Care Cap(s) changed;Connections checked and tightened;Tubing changed 04/11/2015  2:00 AM  Dressing Intervention New dressing;Antimicrobial disc changed 04/11/2015  2:00 AM  Dressing Change Due 04/18/15 04/11/2015  2:00 AM     NG/OG Tube Orogastric 18 Fr. Right mouth (Active)  Placement Verification Auscultation 04/10/2015  8:00 PM  Site Assessment Clean;Dry;Intact 04/10/2015  8:00 PM  Status Infusing tube feed 04/10/2015  8:00 PM  Intake (mL) 90 mL 04/10/2015 10:00 AM     Rectal Tube/Pouch (Active)  Output (mL) 300 mL 04/11/2015  6:00 AM     Urethral Catheter [name], RN Straight-tip 16 Fr. (Active)  Indication for Insertion or Continuance of Catheter Unstable spinal/crush injuries 04/10/2015  8:00 PM  Site Assessment Clean;Intact 04/10/2015  8:00 PM  Catheter Maintenance Bag below level of bladder;Catheter secured;Drainage bag/tubing not touching floor;Insertion date on  drainage bag;No dependent loops;Seal intact 04/10/2015  8:00 PM  Collection Container Standard drainage bag 04/10/2015  8:00 PM  Securement Method Securing device (Describe) 04/10/2015  8:00 PM  Urinary Catheter Interventions Unclamped 04/08/2015  8:00 PM  Output (mL) 150 mL 04/11/2015  6:00 AM    Microbiology/Sepsis markers: Results for orders placed or performed during the hospital encounter of 04/05/15  MRSA PCR Screening     Status: None   Collection Time: 04/05/15  3:00 AM  Result Value Ref Range Status   MRSA by PCR NEGATIVE NEGATIVE Final    Comment:        The GeneXpert MRSA Assay (FDA approved for NASAL specimens only), is one component of a comprehensive MRSA colonization surveillance program. It is not intended to diagnose MRSA infection nor to guide or monitor treatment for MRSA infections.   Culture, respiratory (NON-Expectorated)     Status: None (Preliminary result)   Collection Time: 04/08/15 10:48 AM  Result Value Ref Range Status   Specimen Description TRACHEAL ASPIRATE  Final   Special Requests Normal  Final   Gram Stain   Final    ABUNDANT WBC PRESENT, PREDOMINANTLY PMN FEW SQUAMOUS EPITHELIAL CELLS PRESENT ABUNDANT GRAM POSITIVE COCCI IN CLUSTERS Performed at [facility]    Culture   Final    ABUNDANT STAPHYLOCOCCUS AUREUS Note: RIFAMPIN AND GENTAMICIN SHOULD NOT BE USED AS SINGLE DRUGS FOR TREATMENT OF STAPH INFECTIONS. Performed at [facility]    Report Status PENDING  Incomplete    Anti-infectives:  Anti-infectives    Start     Dose/Rate Route Frequency Ordered Stop   04/10/15 2100  vancomycin (VANCOCIN) 1,250 mg in sodium chloride 0.9 % 250 mL IVPB     1,250 mg 166.7 mL/hr over 90 Minutes Intravenous Every 8 hours 04/10/15 2057     04/08/15 2000  vancomycin (VANCOCIN) IVPB 1000 mg/200 mL premix  Status:  Discontinued     1,000 mg 200 mL/hr over 60 Minutes Intravenous Every 8 hours 04/08/15 1045 04/10/15 2057   04/08/15 1200   vancomycin (VANCOCIN) 1,500 mg in sodium chloride 0.9 % 500 mL IVPB     1,500 mg 250 mL/hr over 120 Minutes Intravenous  Once 04/08/15 1045 04/08/15 1426   04/05/15 1030  piperacillin-tazobactam (ZOSYN) IVPB 3.375 g     3.375 g 12.5 mL/hr over 240 Minutes Intravenous 3 times per day 04/05/15 [id number]        Best Practice/Protocols:  VTE Prophylaxis: Lovenox (prophylaxtic dose) and Mechanical GI Prophylaxis: Proton Pump Inhibitor Continous Sedation  Consults: Treatment Team:  Leeroy Cha, MD Renette Butters, MD    Events:  Subjective:    Overnight Issues: Nurses raise concern that the patient is depress as he is less responsive, not as willing to move arms and participate in care.  Patient is still on some sedation.  Objective:  Vital signs for last 24 hours: Temp:  [100.1 F (37.8 C)-103.2 F (39.6 C)] 100.2 F (37.9 C) (01/04 0721) Pulse Rate:  [88-112] 97 (01/04 0700) Resp:  [14-23] 14 (01/04 0700) BP: (103-122)/(69-79) 106/74 mmHg (01/04 0700) SpO2:  [89 %-100 %] 99 % (01/04 0728) FiO2 (%):  [40 %-100 %] 50 % (01/04 0728)  Hemodynamic parameters for last 24 hours:    Intake/Output from previous day: 01/03 0701 - 01/04 0700 In: 2260.7 [I.V.:968.2; NG/GT:480; IV Piggyback:812.5] Out: 3010 [Urine:2035; Stool:975]  Intake/Output this shift:    Vent settings for last 24 hours: Vent Mode:  [-] PRVC FiO2 (%):  [40 %-100 %] 50 % Set Rate:  [14 bmp] 14 bmp Vt Set:  [620 mL] 620 mL PEEP:  [5 cmH20-8 cmH20] 8 cmH20 Plateau Pressure:  [16 cmH20-25 cmH20] 23 cmH20  Physical Exam:  General: no respiratory distress and More lethargic Neuro: oriented, weakness right upper extremity, weakness right lower extremity, weakness left upper extremity and weakness left lower extremity Resp: clear to auscultation bilaterally CVS: regular rate and rhythm, S1, S2 normal, no murmur, click, rub or gallop GI: soft, nontender, BS WNL, no r/g and Tolerating tube feedings well with  diarrhea. Extremities: no edema, no erythema, pulses WNL  Results for orders placed or performed during the hospital encounter of 04/05/15 (from the past 24 hour(s))  Basic metabolic panel     Status: Abnormal   Collection Time: 04/10/15  9:00 AM  Result Value Ref Range   Sodium 142 135 - 145 mmol/L   Potassium 3.6 3.5 - 5.1 mmol/L   Chloride 105 101 - 111 mmol/L   CO2 27 22 - 32 mmol/L   Glucose, Bld 117 (H) 65 - 99 mg/dL   BUN 15 6 - 20 mg/dL   Creatinine, Ser 1.01 0.61 - 1.24 mg/dL   Calcium 9.2 8.9 - 10.3 mg/dL   GFR calc non Af Amer >60 >60 mL/min   GFR calc Af Amer >60 >60 mL/min   Anion gap 10 5 - 15  Vancomycin, trough     Status: None   Collection Time: 04/10/15  7:45 PM  Result Value Ref Range  Vancomycin Tr 10 10.0 - 20.0 ug/mL  Triglycerides     Status: Abnormal   Collection Time: 04/11/15  4:45 AM  Result Value Ref Range   Triglycerides 165 (H) <150 mg/dL  CBC     Status: Abnormal   Collection Time: 04/11/15  4:45 AM  Result Value Ref Range   WBC 11.2 (H) 4.0 - 10.5 K/uL   RBC 3.50 (L) 4.22 - 5.81 MIL/uL   Hemoglobin 9.7 (L) 13.0 - 17.0 g/dL   HCT 29.3 (L) 39.0 - 52.0 %   MCV 83.7 78.0 - 100.0 fL   MCH 27.7 26.0 - 34.0 pg   MCHC 33.1 30.0 - 36.0 g/dL   RDW 14.3 11.5 - 15.5 %   Platelets 314 150 - 400 K/uL  Basic metabolic panel     Status: Abnormal   Collection Time: 04/11/15  4:45 AM  Result Value Ref Range   Sodium 143 135 - 145 mmol/L   Potassium 3.9 3.5 - 5.1 mmol/L   Chloride 106 101 - 111 mmol/L   CO2 28 22 - 32 mmol/L   Glucose, Bld 134 (H) 65 - 99 mg/dL   BUN 22 (H) 6 - 20 mg/dL   Creatinine, Ser 0.82 0.61 - 1.24 mg/dL   Calcium 9.2 8.9 - 10.3 mg/dL   GFR calc non Af Amer >60 >60 mL/min   GFR calc Af Amer >60 >60 mL/min   Anion gap 9 5 - 15     Assessment/Plan:   NEURO  Altered Mental Status:  sedation and Seems too sedated currrently   Plan: Wean sedation.  PULM  Pneumonia: hospital acquired (not ventilator-associated) Has MRSA  pneumonia   Plan: On Vancomycin.  CARDIO  No issues   Plan: CPM  RENAL  Urine output is good.   Plan: CPM  GI  Diarrhea, not C.diff tested   Plan: CPM, Rectal tube  ID  Pneumonia (hospital acquired (not ventilator-associated) MRSA PNA)   Plan: CPM  HEME  Anemia acute blood loss anemia and anemia of critical illness)   Plan: No blood for now.  ENDO No specific issues   Plan: CPM  Global Issues  MRSA PNA being treated appropriately.  CPM.  Will not perform surgery until better.  Maybe next week.  Get an ABG for now and see if we can wean FIO2.    LOS: 6 days   Additional comments:I reviewed the patient's new clinical lab test results. cbc/bmet and I reviewed the patients new imaging test results. cxr  Critical Care Total Time*: 30 Minutes  Jeanet Lupe 04/11/2015  *Care during the described time interval was provided by me and/or other providers on the critical care team.  I have reviewed this patient's available data, including medical history, events of note, physical examination and test results as part of my evaluation.

## 2015-04-12 ENCOUNTER — Ambulatory Visit (HOSPITAL_COMMUNITY): Payer: BLUE CROSS/BLUE SHIELD

## 2015-04-12 DIAGNOSIS — S83106A Unspecified dislocation of unspecified knee, initial encounter: Secondary | ICD-10-CM

## 2015-04-12 LAB — BLOOD GAS, ARTERIAL
ACID-BASE EXCESS: 3.2 mmol/L — AB (ref 0.0–2.0)
Bicarbonate: 27.4 mEq/L — ABNORMAL HIGH (ref 20.0–24.0)
Drawn by: 28101
FIO2: 0.5
LHR: 14 {breaths}/min
O2 Saturation: 95.9 %
PCO2 ART: 44 mmHg (ref 35.0–45.0)
PEEP/CPAP: 8 cmH2O
PH ART: 7.411 (ref 7.350–7.450)
Patient temperature: 98.6
TCO2: 28.8 mmol/L (ref 0–100)
VT: 620 mL
pO2, Arterial: 83.5 mmHg (ref 80.0–100.0)

## 2015-04-12 MED ORDER — PIVOT 1.5 CAL PO LIQD
1000.0000 mL | ORAL | Status: DC
  Administered 2015-04-13 – 2015-04-17 (×4): 1000 mL
  Filled 2015-04-12 (×9): qty 1000

## 2015-04-12 MED ORDER — LORAZEPAM 2 MG/ML IJ SOLN
1.0000 mg | INTRAMUSCULAR | Status: DC | PRN
  Administered 2015-04-12 – 2015-04-30 (×7): 1 mg via INTRAVENOUS
  Administered 2015-04-30: 2 mg via INTRAVENOUS
  Administered 2015-05-03: 1 mg via INTRAVENOUS
  Administered 2015-05-05: 2 mg via INTRAVENOUS
  Filled 2015-04-12 (×11): qty 1

## 2015-04-12 NOTE — Progress Notes (Signed)
VASCULAR LAB PRELIMINARY  PRELIMINARY  PRELIMINARY  PRELIMINARY  VASCULAR LAB PRELIMINARY  ARTERIAL  ABI completed:    RIGHT    LEFT    PRESSURE WAVEFORM  PRESSURE WAVEFORM  BRACHIAL n/a n/a BRACHIAL 116 triphasic  DP 142 triphasic DP 142 triphasic  PT 136 triphasic PT 137 tripasic    RIGHT LEFT  ABI 1.22 1.22   ABI's appear to be within normal range. No right Brachial Artery was indicated due to IV in upper arm.  [name], RVT 04/12/2015, 5:13 PM    [name], RVT, RDMS 04/12/2015, 5:12 PM
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
## 2015-04-12 NOTE — Progress Notes (Signed)
Patient ID: [name], male   DOB: 09-13-1965, 50 y.o.   MRN: [id number] Neuro stable. Plan to do at least acdf at c4-5 tomorrow and posterior c3 to t3 next week, spoke with trauma
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
## 2015-04-13 ENCOUNTER — Inpatient Hospital Stay (HOSPITAL_COMMUNITY): Payer: BLUE CROSS/BLUE SHIELD

## 2015-04-13 LAB — CBC
HCT: 25.5 % — ABNORMAL LOW (ref 39.0–52.0)
Hemoglobin: 8.3 g/dL — ABNORMAL LOW (ref 13.0–17.0)
MCH: 27.9 pg (ref 26.0–34.0)
MCHC: 32.5 g/dL (ref 30.0–36.0)
MCV: 85.9 fL (ref 78.0–100.0)
PLATELETS: 399 10*3/uL (ref 150–400)
RBC: 2.97 MIL/uL — AB (ref 4.22–5.81)
RDW: 14.2 % (ref 11.5–15.5)
WBC: 11.4 10*3/uL — AB (ref 4.0–10.5)

## 2015-04-13 LAB — BASIC METABOLIC PANEL
ANION GAP: 7 (ref 5–15)
BUN: 26 mg/dL — AB (ref 6–20)
CALCIUM: 9.4 mg/dL (ref 8.9–10.3)
CO2: 29 mmol/L (ref 22–32)
Chloride: 114 mmol/L — ABNORMAL HIGH (ref 101–111)
Creatinine, Ser: 0.62 mg/dL (ref 0.61–1.24)
GFR calc Af Amer: >60 mL/min (ref >60)
Glucose, Bld: 137 mg/dL — ABNORMAL HIGH (ref 65–99)
POTASSIUM: 4.5 mmol/L (ref 3.5–5.1)
SODIUM: 150 mmol/L — AB (ref 135–145)

## 2015-04-13 MED ORDER — DEXTROSE 5 % IV SOLN
1.0000 g | Freq: Three times a day (TID) | INTRAVENOUS | Status: DC
  Administered 2015-04-13 – 2015-04-15 (×5): 1 g via INTRAVENOUS
  Filled 2015-04-13 (×7): qty 1

## 2015-04-13 NOTE — Progress Notes (Signed)
Pharmacy Antibiotic Follow-up Note  Assessment/Plan: [name] is a 50 y.o. year-old male admitted on 04/05/2015.  The patient is currently on day # 6 of vancomycin for MRSA PNA.   Patient febrile at 101.8 for unknown reason. WBC stable at 11.4. Scr normal. Will broaden abx coverage with cefepime and pan culture.  Antimicrobials this admission: 12/29 zosyn>>1/4 1/1 vanc>>  1/6 cefepime>>  Levels/dose changes this admission: 1/3 VT = 10  1/4 VT = 17  Microbiology results: 1/1 resp cx >>MRSA (S -Vanc) MRSA pcr neg 1/6 urine>> 1/6 Blood x2>>  Plan: Continue vancomycin to 1250g IV Q8 Start Cefepime 1g q8 hours Consider vancomycin trough early next week unless indicated sooner Monitor clinical picture, renal function,  F/U C&S, abx deescalation / LOT   Temp (24hrs), Avg:100.7 F (38.2 C), Min:99.3 F (37.4 C), Max:101.8 F (38.8 C)   Recent Labs Lab 04/07/15 0455 04/09/15 0407 04/10/15 0540 04/11/15 0445 04/13/15 0500  WBC 9.8 8.1 9.1 11.2* 11.4*     Recent Labs Lab 04/07/15 0455 04/09/15 0407 04/10/15 0900 04/11/15 0445 04/13/15 0500  CREATININE 0.75 0.65 1.01 0.82 0.62   Estimated Creatinine Clearance: 119.4 mL/min (by C-G formula based on Cr of 0.62).    Allergies  Allergen Reactions  . Other Hives    From work uniform  . Shellfish Allergy Swelling    Lip swelling    Thank you for allowing pharmacy to be a part of this patient's care.  Erin Hearing PharmD., BCPS Clinical Pharmacist Pager [phone] 04/13/2015 9:42 PM

## 2015-04-13 NOTE — Progress Notes (Signed)
Patient ID: [name], male   DOB: 1965-04-11, 50 y.o.   MRN: [id number] Surgery delayed till 11 pm tonite. i did speak with family and dr Hulen Skains. We are going to post pone the case for Tuesday. He will be my first case and  Will do anterior c45 fusion folowed by posterior fusion from c3 to t3 as a second step. Family agrees.

## 2015-04-13 NOTE — Progress Notes (Signed)
Pt remains sedated and on ventilator with cervical traction.  To OR today for ACDF C4-5.  Will follow progress.    Reinaldo Raddle, RN, BSN  Trauma/Neuro ICU Case Manager [phone]

## 2015-04-13 NOTE — Progress Notes (Signed)
Occupational Therapy Progress Note  Spoke with RN.  Splints were on all night and appeared to be fitting well when removed this morning with no evidence of redness, edema, or pain.    Pt for anterior fixation this pm.  MD, is pt appropriate to begin UE exercises  Post surgery?  Please order OT eval and treat if so.  Thank you.   Lucille Passy, OTR/L [phone]
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
## 2015-04-14 ENCOUNTER — Inpatient Hospital Stay (HOSPITAL_COMMUNITY): Payer: BLUE CROSS/BLUE SHIELD

## 2015-04-14 LAB — CBC WITH DIFFERENTIAL/PLATELET
Basophils Absolute: 0 10*3/uL (ref 0.0–0.1)
Basophils Relative: 0 %
EOS PCT: 1 %
Eosinophils Absolute: 0.1 10*3/uL (ref 0.0–0.7)
HCT: 27.1 % — ABNORMAL LOW (ref 39.0–52.0)
HEMOGLOBIN: 8.7 g/dL — AB (ref 13.0–17.0)
LYMPHS PCT: 17 %
Lymphs Abs: 2.3 10*3/uL (ref 0.7–4.0)
MCH: 27.4 pg (ref 26.0–34.0)
MCHC: 32.1 g/dL (ref 30.0–36.0)
MCV: 85.5 fL (ref 78.0–100.0)
MONO ABS: 1.1 10*3/uL — AB (ref 0.1–1.0)
Monocytes Relative: 8 %
NEUTROS PCT: 74 %
Neutro Abs: 9.8 10*3/uL — ABNORMAL HIGH (ref 1.7–7.7)
PLATELETS: 467 10*3/uL — AB (ref 150–400)
RBC: 3.17 MIL/uL — AB (ref 4.22–5.81)
RDW: 14.3 % (ref 11.5–15.5)
WBC: 13.3 10*3/uL — AB (ref 4.0–10.5)

## 2015-04-14 LAB — TRIGLYCERIDES: TRIGLYCERIDES: 118 mg/dL (ref <150)

## 2015-04-14 LAB — BASIC METABOLIC PANEL
ANION GAP: 10 (ref 5–15)
BUN: 29 mg/dL — ABNORMAL HIGH (ref 6–20)
CHLORIDE: 110 mmol/L (ref 101–111)
CO2: 30 mmol/L (ref 22–32)
CREATININE: 0.69 mg/dL (ref 0.61–1.24)
Calcium: 10.1 mg/dL (ref 8.9–10.3)
GFR calc non Af Amer: >60 mL/min (ref >60)
Glucose, Bld: 139 mg/dL — ABNORMAL HIGH (ref 65–99)
POTASSIUM: 4 mmol/L (ref 3.5–5.1)
SODIUM: 150 mmol/L — AB (ref 135–145)

## 2015-04-14 MED ORDER — GLYCOPYRROLATE 0.2 MG/ML IJ SOLN
0.1000 mg | Freq: Once | INTRAMUSCULAR | Status: AC
  Administered 2015-04-14: 0.1 mg via INTRAVENOUS
  Filled 2015-04-14: qty 1

## 2015-04-14 NOTE — Progress Notes (Signed)
No acute events MAXIMUM TEMPERATURE 101.8 VSS Neurologically unchanged [place]s tongs in place with traction Surgery rescheduled for next Tuesday with Dr. Joya Salm

## 2015-04-14 NOTE — Progress Notes (Signed)
Per MD order, pt is to keep Foley catheter, see chart for order.

## 2015-04-14 NOTE — Progress Notes (Signed)
Follow up - Trauma and Critical Care  Patient Details:    [name] is an 50 y.o. male.  Lines/tubes : Airway 7.5 mm (Active)  Secured at (cm) 27 cm 04/14/2015  7:24 AM  Measured From Lips 04/14/2015  7:24 AM  Secured Location Right 04/14/2015  7:24 AM  Secured By [employer] 04/14/2015  7:24 AM  Tube Holder Repositioned Yes 04/14/2015  7:24 AM  Cuff Pressure (cm H2O) 30 cm H2O 04/13/2015  3:45 PM  Site Condition Dry 04/14/2015  7:24 AM     PICC Triple Lumen 99991111 PICC Right Basilic 41 cm 0 cm (Active)  Indication for Insertion or Continuance of Line Administration of hyperosmolar/irritating solutions (i.e. TPN, Vancomycin, etc.);Head or chest injuries (Tracheotomy, burns, open chest wounds) 04/14/2015  7:25 AM  Exposed Catheter (cm) 0 cm 04/06/2015 10:34 AM  Site Assessment Clean;Dry;Intact 04/14/2015  7:41 AM  Lumen #1 Status Saline locked 04/14/2015  7:41 AM  Lumen #2 Status Infusing 04/14/2015  7:41 AM  Lumen #3 Status Infusing 04/14/2015  7:41 AM  Dressing Type Transparent;Occlusive 04/14/2015  7:41 AM  Dressing Status Clean;Dry;Intact;Antimicrobial disc in place 04/14/2015  7:41 AM  Line Care Connections checked and tightened 04/14/2015  7:41 AM  Dressing Intervention New dressing;Antimicrobial disc changed 04/11/2015  2:00 AM  Dressing Change Due 04/18/15 04/14/2015  7:41 AM     NG/OG Tube Orogastric 18 Fr. Right mouth (Active)  Placement Verification Auscultation 04/14/2015  7:32 AM  Site Assessment Clean;Dry;Intact 04/14/2015  7:32 AM  Status Infusing tube feed 04/14/2015  7:32 AM  Intake (mL) 100 mL 04/11/2015 10:00 PM     Rectal Tube/Pouch (Active)  Output (mL) 75 mL 04/14/2015  7:32 AM  Intake (mL) 0 mL 04/13/2015  6:00 PM     Urethral Catheter [name], RN Straight-tip 16 Fr. (Active)  Indication for Insertion or Continuance of Catheter Unstable spinal/crush injuries;Peri-operative use for selective surgical procedure 04/14/2015  7:32 AM  Site Assessment Clean;Intact 04/14/2015   7:32 AM  Catheter Maintenance Bag below level of bladder;Catheter secured;Drainage bag/tubing not touching floor;Insertion date on drainage bag;No dependent loops;Seal intact 04/14/2015  7:32 AM  Collection Container Standard drainage bag 04/14/2015  7:32 AM  Securement Method Securing device (Describe) 04/14/2015  7:32 AM  Urinary Catheter Interventions Unclamped 04/14/2015  7:32 AM  Output (mL) 175 mL 04/14/2015  7:26 AM    Microbiology/Sepsis markers: Results for orders placed or performed during the hospital encounter of 04/05/15  MRSA PCR Screening     Status: None   Collection Time: 04/05/15  3:00 AM  Result Value Ref Range Status   MRSA by PCR NEGATIVE NEGATIVE Final    Comment:        The GeneXpert MRSA Assay (FDA approved for NASAL specimens only), is one component of a comprehensive MRSA colonization surveillance program. It is not intended to diagnose MRSA infection nor to guide or monitor treatment for MRSA infections.   Culture, respiratory (NON-Expectorated)     Status: None   Collection Time: 04/08/15 10:48 AM  Result Value Ref Range Status   Specimen Description TRACHEAL ASPIRATE  Final   Special Requests Normal  Final   Gram Stain   Final    ABUNDANT WBC PRESENT, PREDOMINANTLY PMN FEW SQUAMOUS EPITHELIAL CELLS PRESENT ABUNDANT GRAM POSITIVE COCCI IN CLUSTERS Performed at [facility]    Culture   Final    ABUNDANT METHICILLIN RESISTANT STAPHYLOCOCCUS AUREUS Note: RIFAMPIN AND GENTAMICIN SHOULD NOT BE USED AS SINGLE DRUGS FOR  TREATMENT OF STAPH INFECTIONS. This organism DOES NOT demonstrate inducible Clindamycin resistance in vitro. CRITICAL RESULT CALLED TO, READ BACK BY AND VERIFIED WITH: [name] 1/4  @0805  BY REAMM Performed at [facility]    Report Status 04/11/2015 FINAL  Final   Organism ID, Bacteria METHICILLIN RESISTANT STAPHYLOCOCCUS AUREUS  Final      Susceptibility   Methicillin resistant staphylococcus aureus - MIC*    CLINDAMYCIN  <=0.25 SENSITIVE Sensitive     ERYTHROMYCIN >=8 RESISTANT Resistant     GENTAMICIN <=0.5 SENSITIVE Sensitive     LEVOFLOXACIN 4 INTERMEDIATE Intermediate     OXACILLIN >=4 RESISTANT Resistant     RIFAMPIN <=0.5 SENSITIVE Sensitive     TRIMETH/SULFA <=10 SENSITIVE Sensitive     VANCOMYCIN 1 SENSITIVE Sensitive     TETRACYCLINE <=1 SENSITIVE Sensitive     * ABUNDANT METHICILLIN RESISTANT STAPHYLOCOCCUS AUREUS    Anti-infectives:  Anti-infectives    Start     Dose/Rate Route Frequency Ordered Stop   04/13/15 2200  ceFEPIme (MAXIPIME) 1 g in dextrose 5 % 50 mL IVPB     1 g 100 mL/hr over 30 Minutes Intravenous 3 times per day 04/13/15 2141     04/10/15 2100  vancomycin (VANCOCIN) 1,250 mg in sodium chloride 0.9 % 250 mL IVPB     1,250 mg 166.7 mL/hr over 90 Minutes Intravenous Every 8 hours 04/10/15 2057     04/08/15 2000  vancomycin (VANCOCIN) IVPB 1000 mg/200 mL premix  Status:  Discontinued     1,000 mg 200 mL/hr over 60 Minutes Intravenous Every 8 hours 04/08/15 1045 04/10/15 2057   04/08/15 1200  vancomycin (VANCOCIN) 1,500 mg in sodium chloride 0.9 % 500 mL IVPB     1,500 mg 250 mL/hr over 120 Minutes Intravenous  Once 04/08/15 1045 04/08/15 1426   04/05/15 1030  piperacillin-tazobactam (ZOSYN) IVPB 3.375 g  Status:  Discontinued     3.375 g 12.5 mL/hr over 240 Minutes Intravenous 3 times per day 04/05/15 0946 04/11/15 1058      Best Practice/Protocols:  VTE Prophylaxis: Lovenox (prophylaxtic dose) Continous Sedation  Consults: Treatment Team:  Leeroy Cha, MD Renette Butters, MD    Events:  Subjective:    Overnight Issues: NSU has postponed surgery until Tuesday Has a cuff leak since yesterday Awake since sedation has been weaned   Moves shoulders and upper arms   Objective:  Vital signs for last 24 hours: Temp:  [99.4 F (37.4 C)-101.8 F (38.8 C)] 100.6 F (38.1 C) (01/07 0800) Pulse Rate:  [72-110] 106 (01/07 0900) Resp:  [11-21] 17 (01/07  0900) BP: (100-131)/(61-86) 122/80 mmHg (01/07 0900) SpO2:  [95 %-100 %] 97 % (01/07 0900) FiO2 (%):  [40 %] 40 % (01/07 0724) Weight:  [78.926 kg (174 lb)] 78.926 kg (174 lb) (01/07 0500)  Hemodynamic parameters for last 24 hours:    Intake/Output from previous day: 01/06 0701 - 01/07 0700 In: 1485.5 [I.V.:595.5; NG/GT:540; IV Piggyback:350] Out: 2635 [Urine:2635]  Intake/Output this shift: Total I/O In: -  Out: 250 [Urine:175; Stool:75]  Vent settings for last 24 hours: Vent Mode:  [-] PRVC FiO2 (%):  [40 %] 40 % Set Rate:  [14 bmp] 14 bmp Vt Set:  [620 mL] 620 mL PEEP:  [5 cmH20-8 cmH20] 5 cmH20 Pressure Support:  [10 cmH20] 10 cmH20 Plateau Pressure:  [20 cmH20-26 cmH20] 26 cmH20  Physical Exam:  General: alert and no respiratory distress Neuro: alert, oriented, nonfocal exam, weakness right upper extremity,  weakness right lower extremity, weakness left upper extremity and weakness left lower extremity Resp: rhonchi bilaterally CVS: regular rate and rhythm, S1, S2 normal, no murmur, click, rub or gallop GI: soft, nontender, BS WNL, no r/g Extremities: no edema, no erythema, pulses WNL  Results for orders placed or performed during the hospital encounter of 04/05/15 (from the past 24 hour(s))  Triglycerides     Status: None   Collection Time: 04/14/15  4:15 AM  Result Value Ref Range   Triglycerides 118 <150 mg/dL  Basic metabolic panel     Status: Abnormal   Collection Time: 04/14/15  4:15 AM  Result Value Ref Range   Sodium 150 (H) 135 - 145 mmol/L   Potassium 4.0 3.5 - 5.1 mmol/L   Chloride 110 101 - 111 mmol/L   CO2 30 22 - 32 mmol/L   Glucose, Bld 139 (H) 65 - 99 mg/dL   BUN 29 (H) 6 - 20 mg/dL   Creatinine, Ser 0.69 0.61 - 1.24 mg/dL   Calcium 10.1 8.9 - 10.3 mg/dL   GFR calc non Af Amer >60 >60 mL/min   GFR calc Af Amer >60 >60 mL/min   Anion gap 10 5 - 15  CBC with Differential/Platelet     Status: Abnormal   Collection Time: 04/14/15  4:15 AM   Result Value Ref Range   WBC 13.3 (H) 4.0 - 10.5 K/uL   RBC 3.17 (L) 4.22 - 5.81 MIL/uL   Hemoglobin 8.7 (L) 13.0 - 17.0 g/dL   HCT 27.1 (L) 39.0 - 52.0 %   MCV 85.5 78.0 - 100.0 fL   MCH 27.4 26.0 - 34.0 pg   MCHC 32.1 30.0 - 36.0 g/dL   RDW 14.3 11.5 - 15.5 %   Platelets 467 (H) 150 - 400 K/uL   Neutrophils Relative % 74 %   Lymphocytes Relative 17 %   Monocytes Relative 8 %   Eosinophils Relative 1 %   Basophils Relative 0 %   Neutro Abs 9.8 (H) 1.7 - 7.7 K/uL   Lymphs Abs 2.3 0.7 - 4.0 K/uL   Monocytes Absolute 1.1 (H) 0.1 - 1.0 K/uL   Eosinophils Absolute 0.1 0.0 - 0.7 K/uL   Basophils Absolute 0.0 0.0 - 0.1 K/uL   RBC Morphology POLYCHROMASIA PRESENT      Assessment/Plan:    NEURO  Awake and moving upper extremities    Plan:Surgery postponed until Tuesday per NSU  May need to adjust  sedation  PULM  Atelectasis/collapse (focal and Right lung)    Plan: CPM. Wean as tolerated, but not to be extubated anytime soon. He has a cuff leak and ETT will need to be replaced.  He has significant secretions  Will give one dose of robinul    CARDIO  No issues   Plan: CPM  RENAL  Urine output is good and renal function is norma.   Plan: CPM keep foley for now   GI  No acute issues   Plan: CPM, hole tube feedings now.  ID  Pneumonia (hospital acquired (not ventilator-associated) MRSA)   Plan: Continue Vancomycin  HEME  Anemia acute blood loss anemia and anemia of critical illness)   Plan: May need blood later, onlyneeds type and screen for surgery  ENDO No issue   Plan: CPM  Global Issues  Continue TF Continue to wean but not extubate.          LOS: 9 days   Additional comments:None  Critical Care Total Time*: 30  Minutes  Louay Myrie A. 04/14/2015  *Care during the described time interval was provided by me and/or other providers on the critical care team.  I have reviewed this patient's available data, including medical  history, events of note, physical examination and test results as part of my evaluation.

## 2015-04-14 NOTE — Progress Notes (Addendum)
CRITICAL VALUE ALERT  Critical value received:  Blood culture results: Gram + cocci in clusters   Date of notification:  04/14/15  Time of notification:  [id number]  Critical value read back:Yes.    Nurse who received alert:  Shara Blazing  MD notified (1st page):  [place]  Time of first page:  1845  MD notified (2nd page):  Time of second page:  Responding MD:    Time MD responded:

## 2015-04-15 LAB — CULTURE, BLOOD (ROUTINE X 2)

## 2015-04-15 LAB — URINE CULTURE
Culture: NO GROWTH
Special Requests: NORMAL

## 2015-04-15 NOTE — Progress Notes (Signed)
Patient ID: [name], male   DOB: [birth year], 50 y.o.   MRN: [id number] Follow up - Trauma and Critical Care  Patient Details:    [name] is an 50 y.o. male.  Lines/tubes : Airway 7.5 mm (Active)  Secured at (cm) 27 cm 04/14/2015  7:24 AM  Measured From Lips 04/14/2015  7:24 AM  Secured Location Right 04/14/2015  7:24 AM  Secured By [employer] 04/14/2015  7:24 AM  Tube Holder Repositioned Yes 04/14/2015  7:24 AM  Cuff Pressure (cm H2O) 30 cm H2O 04/13/2015  3:45 PM  Site Condition Dry 04/14/2015  7:24 AM     PICC Triple Lumen 99991111 PICC Right Basilic 41 cm 0 cm (Active)  Indication for Insertion or Continuance of Line Administration of hyperosmolar/irritating solutions (i.e. TPN, Vancomycin, etc.);Head or chest injuries (Tracheotomy, burns, open chest wounds) 04/14/2015  7:25 AM  Exposed Catheter (cm) 0 cm 04/06/2015 10:34 AM  Site Assessment Clean;Dry;Intact 04/14/2015  7:41 AM  Lumen #1 Status Saline locked 04/14/2015  7:41 AM  Lumen #2 Status Infusing 04/14/2015  7:41 AM  Lumen #3 Status Infusing 04/14/2015  7:41 AM  Dressing Type Transparent;Occlusive 04/14/2015  7:41 AM  Dressing Status Clean;Dry;Intact;Antimicrobial disc in place 04/14/2015  7:41 AM  Line Care Connections checked and tightened 04/14/2015  7:41 AM  Dressing Intervention New dressing;Antimicrobial disc changed 04/11/2015  2:00 AM  Dressing Change Due 04/18/15 04/14/2015  7:41 AM     NG/OG Tube Orogastric 18 Fr. Right mouth (Active)  Placement Verification Auscultation 04/14/2015  7:32 AM  Site Assessment Clean;Dry;Intact 04/14/2015  7:32 AM  Status Infusing tube feed 04/14/2015  7:32 AM  Intake (mL) 100 mL 04/11/2015 10:00 PM     Rectal Tube/Pouch (Active)  Output (mL) 75 mL 04/14/2015  7:32 AM  Intake (mL) 0 mL 04/13/2015  6:00 PM     Urethral Catheter [name], RN Straight-tip 16 Fr. (Active)  Indication for Insertion or Continuance of Catheter Unstable spinal/crush injuries;Peri-operative use for  selective surgical procedure 04/14/2015  7:32 AM  Site Assessment Clean;Intact 04/14/2015  7:32 AM  Catheter Maintenance Bag below level of bladder;Catheter secured;Drainage bag/tubing not touching floor;Insertion date on drainage bag;No dependent loops;Seal intact 04/14/2015  7:32 AM  Collection Container Standard drainage bag 04/14/2015  7:32 AM  Securement Method Securing device (Describe) 04/14/2015  7:32 AM  Urinary Catheter Interventions Unclamped 04/14/2015  7:32 AM  Output (mL) 175 mL 04/14/2015  7:26 AM    Microbiology/Sepsis markers: Results for orders placed or performed during the hospital encounter of 04/05/15  MRSA PCR Screening     Status: None   Collection Time: 04/05/15  3:00 AM  Result Value Ref Range Status   MRSA by PCR NEGATIVE NEGATIVE Final    Comment:        The GeneXpert MRSA Assay (FDA approved for NASAL specimens only), is one component of a comprehensive MRSA colonization surveillance program. It is not intended to diagnose MRSA infection nor to guide or monitor treatment for MRSA infections.   Culture, respiratory (NON-Expectorated)     Status: None   Collection Time: 04/08/15 10:48 AM  Result Value Ref Range Status   Specimen Description TRACHEAL ASPIRATE  Final   Special Requests Normal  Final   Gram Stain   Final    ABUNDANT WBC PRESENT, PREDOMINANTLY PMN FEW SQUAMOUS EPITHELIAL CELLS PRESENT ABUNDANT GRAM POSITIVE COCCI IN CLUSTERS Performed at [facility]    Culture   Final    ABUNDANT  METHICILLIN RESISTANT STAPHYLOCOCCUS AUREUS Note: RIFAMPIN AND GENTAMICIN SHOULD NOT BE USED AS SINGLE DRUGS FOR TREATMENT OF STAPH INFECTIONS. This organism DOES NOT demonstrate inducible Clindamycin resistance in vitro. CRITICAL RESULT CALLED TO, READ BACK BY AND VERIFIED WITH: [name] 1/4  @0805  BY REAMM Performed at [facility]    Report Status 04/11/2015 FINAL  Final   Organism ID, Bacteria METHICILLIN RESISTANT STAPHYLOCOCCUS AUREUS  Final       Susceptibility   Methicillin resistant staphylococcus aureus - MIC*    CLINDAMYCIN <=0.25 SENSITIVE Sensitive     ERYTHROMYCIN >=8 RESISTANT Resistant     GENTAMICIN <=0.5 SENSITIVE Sensitive     LEVOFLOXACIN 4 INTERMEDIATE Intermediate     OXACILLIN >=4 RESISTANT Resistant     RIFAMPIN <=0.5 SENSITIVE Sensitive     TRIMETH/SULFA <=10 SENSITIVE Sensitive     VANCOMYCIN 1 SENSITIVE Sensitive     TETRACYCLINE <=1 SENSITIVE Sensitive     * ABUNDANT METHICILLIN RESISTANT STAPHYLOCOCCUS AUREUS  Culture, blood (routine x 2)     Status: None (Preliminary result)   Collection Time: 04/13/15  9:55 PM  Result Value Ref Range Status   Specimen Description BLOOD LEFT HAND  Final   Special Requests IN PEDIATRIC BOTTLE 4CC  Final   Culture NO GROWTH < 12 HOURS  Final   Report Status PENDING  Incomplete  Culture, blood (routine x 2)     Status: None (Preliminary result)   Collection Time: 04/13/15 10:05 PM  Result Value Ref Range Status   Specimen Description BLOOD LEFT HAND  Final   Special Requests IN PEDIATRIC BOTTLE [place]  Final   Culture  Setup Time   Final    GRAM POSITIVE COCCI IN CLUSTERS PED BOTTLE CALLED TO [place] RN 04/14/15 1828 [place]    Culture NO GROWTH < 12 HOURS  Final   Report Status PENDING  Incomplete    Anti-infectives:  Anti-infectives    Start     Dose/Rate Route Frequency Ordered Stop   04/13/15 2200  ceFEPIme (MAXIPIME) 1 g in dextrose 5 % 50 mL IVPB     1 g 100 mL/hr over 30 Minutes Intravenous 3 times per day 04/13/15 2141     04/10/15 2100  vancomycin (VANCOCIN) 1,250 mg in sodium chloride 0.9 % 250 mL IVPB     1,250 mg 166.7 mL/hr over 90 Minutes Intravenous Every 8 hours 04/10/15 2057     04/08/15 2000  vancomycin (VANCOCIN) IVPB 1000 mg/200 mL premix  Status:  Discontinued     1,000 mg 200 mL/hr over 60 Minutes Intravenous Every 8 hours 04/08/15 1045 04/10/15 2057   04/08/15 1200  vancomycin (VANCOCIN) 1,500 mg in sodium chloride 0.9 % 500 mL IVPB      1,500 mg 250 mL/hr over 120 Minutes Intravenous  Once 04/08/15 1045 04/08/15 1426   04/05/15 1030  piperacillin-tazobactam (ZOSYN) IVPB 3.375 g  Status:  Discontinued     3.375 g 12.5 mL/hr over 240 Minutes Intravenous 3 times per day 04/05/15 0946 04/11/15 1058      Best Practice/Protocols:  VTE Prophylaxis: Lovenox (prophylaxtic dose) Continous Sedation  Consults: Treatment Team:  Leeroy Cha, MD Renette Butters, MD    Events:  Subjective:    Overnight Issues: NSU has postponed surgery until Tuesday Continues to have cuff leak.  Also has a fair amount of secretions.     Objective:  Vital signs for last 24 hours: Temp:  [98.7 F (37.1 C)-101 F (38.3 C)] 99.3 F (37.4 C) (  01/08 0800) Pulse Rate:  [74-101] 82 (01/08 0900) Resp:  [13-26] 19 (01/08 0900) BP: (97-136)/(64-84) 116/73 mmHg (01/08 0900) SpO2:  [92 %-100 %] 100 % (01/08 0900) FiO2 (%):  [40 %-50 %] 50 % (01/08 0750)  Hemodynamic parameters for last 24 hours:    Intake/Output from previous day: 01/07 0701 - 01/08 0700 In: 2927.7 [I.V.:1377.7; NG/GT:1500; IV Piggyback:50] Out: 2395 [Urine:2295; Stool:100]  Intake/Output this shift: Total I/O In: 382.2 [I.V.:202.2; NG/GT:180] Out: 350 [Urine:350]  Vent settings for last 24 hours: Vent Mode:  [-] PRVC FiO2 (%):  [40 %-50 %] 50 % Set Rate:  [14 bmp] 14 bmp Vt Set:  [620 mL] 620 mL PEEP:  [5 cmH20] 5 cmH20 Pressure Support:  [5 cmH20] 5 cmH20 Plateau Pressure:  [17 [id number] cmH20] 18 cmH20  Physical Exam:  General: alert and no respiratory distress Neuro: alert, weak in all extremities.   Resp: coarse bilaterally CVS: regular rate and rhythm GI: soft, nontender,  no r/g Extremities: no edema, no erythema, pulses WNL  No results found for this or any previous visit (from the past 24 hour(s)).   Assessment/Plan:    NEURO  Awake and moving upper extremities some.    Plan:Surgery postponed until Tuesday per NSU  May need to adjust   sedation  PULM  Atelectasis/collapse (focal and Right lung).  Ventilator dependent respiratory failure   Plan: Can consider change out of ET Tube, but patient is doing well on pressure support.  Would just leave patient on pressure support at higher rate at night or a rate of 4 or so to minimize ventilator alarms.  With PS 10, he is taking adequate tidal volumes with minute ventilation 11.8.  Would NOT extubate pre op given reports of secretions (would be difficult for him to get out with spinal cord injury and precautions).  Also if he failed and required reintubation, would increase risk of destabilization of spinal injury.  CARDIO  No issues   Plan: CPM  RENAL  Urine output is good and renal function is normal.   Plan: CPM keep foley for now   GI  No acute issues   Plan: CPM, resumed tube feeds.  ID  Pneumonia (hospital acquired (not ventilator-associated) MRSA)   Plan: Continue Vancomycin  HEME  Anemia acute blood loss anemia and anemia of critical illness   Plan: May need blood later, onlyneeds type and screen for surgery  ENDO No issue   Plan: CPM  Global Issues  Continue TF Continue to wean but not extubate.          LOS: 10 days   Additional comments:None  Critical Care Total Time*: 30 Minutes  Taylour Lietzke 04/15/2015  *Care during the described time interval was provided by me and/or other providers on the critical care team.  I have reviewed this patient's available data, including medical history, events of note, physical examination and test results as part of my evaluation.

## 2015-04-15 NOTE — Progress Notes (Signed)
No acute events Neurologically unchanged In [place]s tongs with traction To OR Tuesday with Dr. Joya Salm

## 2015-04-15 NOTE — Progress Notes (Signed)
Pt has significant persistent cuff leak. Exhaled VT <500, with set VT 620. Pt on Cpap/PS since 0750, but MV <3 and alarming frequently. Placed pt back on FS [facility]) and added more air to cuff. RN aware and MD aware. RT will continue to closely monitor.

## 2015-04-16 ENCOUNTER — Encounter (HOSPITAL_COMMUNITY): Payer: Self-pay | Admitting: Anesthesiology

## 2015-04-16 DIAGNOSIS — E870 Hyperosmolality and hypernatremia: Secondary | ICD-10-CM

## 2015-04-16 DIAGNOSIS — J15212 Pneumonia due to Methicillin resistant Staphylococcus aureus: Secondary | ICD-10-CM

## 2015-04-16 DIAGNOSIS — J9601 Acute respiratory failure with hypoxia: Secondary | ICD-10-CM

## 2015-04-16 LAB — BASIC METABOLIC PANEL
ANION GAP: 9 (ref 5–15)
BUN: 23 mg/dL — ABNORMAL HIGH (ref 6–20)
CALCIUM: 9.7 mg/dL (ref 8.9–10.3)
CO2: 28 mmol/L (ref 22–32)
Chloride: 110 mmol/L (ref 101–111)
Creatinine, Ser: 0.52 mg/dL — ABNORMAL LOW (ref 0.61–1.24)
GFR calc Af Amer: >60 mL/min (ref >60)
GLUCOSE: 101 mg/dL — AB (ref 65–99)
Potassium: 4.1 mmol/L (ref 3.5–5.1)
SODIUM: 147 mmol/L — AB (ref 135–145)

## 2015-04-16 LAB — CBC WITH DIFFERENTIAL/PLATELET
BASOS ABS: 0.1 10*3/uL (ref 0.0–0.1)
Basophils Relative: 0 %
EOS ABS: 0.7 10*3/uL (ref 0.0–0.7)
EOS PCT: 5 %
HCT: 25.4 % — ABNORMAL LOW (ref 39.0–52.0)
Hemoglobin: 8.4 g/dL — ABNORMAL LOW (ref 13.0–17.0)
Lymphocytes Relative: 8 %
Lymphs Abs: 1.2 10*3/uL (ref 0.7–4.0)
MCH: 28.3 pg (ref 26.0–34.0)
MCHC: 33.1 g/dL (ref 30.0–36.0)
MCV: 85.5 fL (ref 78.0–100.0)
MONO ABS: 1 10*3/uL (ref 0.1–1.0)
Monocytes Relative: 7 %
Neutro Abs: 11.6 10*3/uL — ABNORMAL HIGH (ref 1.7–7.7)
Neutrophils Relative %: 80 %
PLATELETS: 495 10*3/uL — AB (ref 150–400)
RBC: 2.97 MIL/uL — AB (ref 4.22–5.81)
RDW: 14.3 % (ref 11.5–15.5)
WBC: 14.6 10*3/uL — ABNORMAL HIGH (ref 4.0–10.5)

## 2015-04-16 NOTE — Progress Notes (Signed)
Pt remains sedated and on ventilator with cervical traction in place.  Planning anterior neck surgery tomorrow with neurosurgery.  Will continue to follow progress.    Reinaldo Raddle, RN, BSN  Trauma/Neuro ICU Case Manager [phone]

## 2015-04-16 NOTE — Anesthesia Preprocedure Evaluation (Addendum)
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 negative psych ROS   GI/Hepatic negative GI ROS, Neg liver ROS,   Endo/Other  negative endocrine ROS  Renal/GU negative Renal ROS  negative genitourinary   Musculoskeletal Fx/dislocation C4-C5 Fx/dislocation C7-T1 In cervical traction with 15lbs via [place]-Well tongs   Abdominal   Peds  Hematology  (+) anemia ,   Anesthesia Other Findings   Reproductive/Obstetrics negative OB ROS                           Anesthesia Physical Anesthesia Plan  ASA: III  Anesthesia Plan: General   Post-op Pain Management:    Induction: Intravenous  Airway Management Planned: Oral ETT  Additional Equipment:   Intra-op Plan:   Post-operative Plan: Post-operative intubation/ventilation  Informed Consent: I have reviewed the patients History and Physical, chart, labs and discussed the procedure including the risks, benefits and alternatives for the proposed anesthesia with the patient or authorized representative who has indicated his/her understanding and acceptance.     Plan Discussed with: CRNA, Anesthesiologist and Surgeon  Anesthesia Plan Comments: (Will probably need to change ETT after procedure due to cuff leak)       Anesthesia Quick Evaluation
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
## 2015-04-16 NOTE — Progress Notes (Signed)
Trauma Service Note  Subjective: Patient is stable.  Able to communicate with me without a problem.  Objective: Vital signs in last 24 hours: Temp:  [97.7 F (36.5 C)-98.2 F (36.8 C)] 98.2 F (36.8 C) (01/09 0724) Pulse Rate:  [22-91] 89 (01/09 0800) Resp:  [11-25] 14 (01/09 0800) BP: (99-128)/(65-86) 110/71 mmHg (01/09 0800) SpO2:  [98 %-100 %] 98 % (01/09 0800) FiO2 (%):  [40 %] 40 % (01/09 0729) Last BM Date:  (flexiseal)  Intake/Output from previous day: 01/08 0701 - 01/09 0700 In: 2724.7 [I.V.:1584.7; NG/GT:1140] Out: 1865 [Urine:1865] Intake/Output this shift: Total I/O In: 62.8 [I.V.:62.8] Out: 100 [Urine:100]  General: No acute distress.  Lungs: Clear to auscultation  Abd: Soft, diarrhea, tube feedings are off for now per the neurosurgeon.  Will clarify.  Extremities: No changes  Neuro: Still low quadriplegia.  Lab Results: CBC   Recent Labs  04/14/15 0415  WBC 13.3*  HGB 8.7*  HCT 27.1*  PLT 467*   BMET  Recent Labs  04/14/15 0415  NA 150*  K 4.0  CL 110  CO2 30  GLUCOSE 139*  BUN 29*  CREATININE 0.69  CALCIUM 10.1   PT/INR No results for input(s): LABPROT, INR in the last 72 hours. ABG No results for input(s): PHART, HCO3 in the last 72 hours.  Invalid input(s): PCO2, PO2  Studies/Results: No results found.  Anti-infectives: Anti-infectives    Start     Dose/Rate Route Frequency Ordered Stop   04/13/15 2200  ceFEPIme (MAXIPIME) 1 g in dextrose 5 % 50 mL IVPB  Status:  Discontinued     1 g 100 mL/hr over 30 Minutes Intravenous 3 times per day 04/13/15 2141 04/15/15 1050   04/10/15 2100  vancomycin (VANCOCIN) 1,250 mg in sodium chloride 0.9 % 250 mL IVPB  Status:  Discontinued     1,250 mg 166.7 mL/hr over 90 Minutes Intravenous Every 8 hours 04/10/15 2057 04/15/15 1050   04/08/15 2000  vancomycin (VANCOCIN) IVPB 1000 mg/200 mL premix  Status:  Discontinued     1,000 mg 200 mL/hr over 60 Minutes Intravenous Every 8 hours  04/08/15 1045 04/10/15 2057   04/08/15 1200  vancomycin (VANCOCIN) 1,500 mg in sodium chloride 0.9 % 500 mL IVPB     1,500 mg 250 mL/hr over 120 Minutes Intravenous  Once 04/08/15 1045 04/08/15 1426   04/05/15 1030  piperacillin-tazobactam (ZOSYN) IVPB 3.375 g  Status:  Discontinued     3.375 g 12.5 mL/hr over 240 Minutes Intravenous 3 times per day 04/05/15 0946 04/11/15 1058      Assessment/Plan: s/p Procedure(s): C4-5 Anterior cervical decompression/diskectomy/fusionPOSTERIOR CERVICAL FUSION/FORAMINOTOMY LEVEL 1 POSTERIOR CERVICAL FUSION/FORAMINOTOMY LEVEL 1 Clarify if the patient can continue tube feeidings.  Right now he is on the schedule fro tomorrow.  Check labs and CXR.  LOS: 11 days   [name]. Dahlia Bailiff, MD, FACS [phone] Trauma Surgeon 04/16/2015

## 2015-04-16 NOTE — Progress Notes (Signed)
Patient ID: [name], male   DOB: Aug 19, 1965, 50 y.o.   MRN: [id number] Tried to schedule for surgery today if there were some cancellations. To or in am. First stage acdf 45 then as a second stage c3 to t3 lateral mass screws fusion .

## 2015-04-17 ENCOUNTER — Inpatient Hospital Stay (HOSPITAL_COMMUNITY): Payer: BLUE CROSS/BLUE SHIELD

## 2015-04-17 ENCOUNTER — Encounter (HOSPITAL_COMMUNITY)
Admission: EM | Disposition: A | Discharge status: Nursing Home (Includes ICF) | Payer: Self-pay | Source: Home / Self Care

## 2015-04-17 ENCOUNTER — Inpatient Hospital Stay (HOSPITAL_COMMUNITY): Payer: BLUE CROSS/BLUE SHIELD | Admitting: Certified Registered Nurse Anesthetist

## 2015-04-17 ENCOUNTER — Encounter (HOSPITAL_COMMUNITY): Payer: Self-pay | Admitting: Certified Registered Nurse Anesthetist

## 2015-04-17 HISTORY — PX: POSTERIOR CERVICAL FUSION/FORAMINOTOMY: SHX5038

## 2015-04-17 HISTORY — PX: ANTERIOR CERVICAL DECOMP/DISCECTOMY FUSION: SHX1161

## 2015-04-17 LAB — BLOOD GAS, ARTERIAL
Acid-Base Excess: 3.5 mmol/L — ABNORMAL HIGH (ref 0.0–2.0)
BICARBONATE: 27.5 meq/L — AB (ref 20.0–24.0)
Drawn by: 24486
FIO2: 0.4
LHR: 14 {breaths}/min
O2 Saturation: 95.1 %
PEEP: 5 cmH2O
PO2 ART: 76.7 mmHg — AB (ref 80.0–100.0)
Patient temperature: 99
TCO2: 28.7 mmol/L (ref 0–100)
VT: 600 mL
pCO2 arterial: 41.9 mmHg (ref 35.0–45.0)
pH, Arterial: 7.434 (ref 7.350–7.450)

## 2015-04-17 LAB — TRIGLYCERIDES: TRIGLYCERIDES: 244 mg/dL — AB (ref <150)

## 2015-04-17 LAB — PREPARE RBC (CROSSMATCH)

## 2015-04-17 SURGERY — ANTERIOR CERVICAL DECOMPRESSION/DISCECTOMY FUSION 1 LEVEL
Anesthesia: General | Site: Spine Cervical

## 2015-04-17 MED ORDER — LIDOCAINE HCL (CARDIAC) 20 MG/ML IV SOLN
INTRAVENOUS | Status: AC
  Filled 2015-04-17: qty 5

## 2015-04-17 MED ORDER — PHENYLEPHRINE HCL 10 MG/ML IJ SOLN
10.0000 mg | INTRAVENOUS | Status: DC | PRN
  Administered 2015-04-17: 15 ug/min via INTRAVENOUS

## 2015-04-17 MED ORDER — PROPOFOL 10 MG/ML IV BOLUS
INTRAVENOUS | Status: AC
  Filled 2015-04-17: qty 20

## 2015-04-17 MED ORDER — VANCOMYCIN HCL 10 G IV SOLR
1250.0000 mg | INTRAVENOUS | Status: AC
  Administered 2015-04-17: 1250 mg via INTRAVENOUS
  Filled 2015-04-17: qty 1250

## 2015-04-17 MED ORDER — THROMBIN 5000 UNITS EX SOLR
OROMUCOSAL | Status: DC | PRN
  Administered 2015-04-17: 5 mL via TOPICAL
  Administered 2015-04-17: 09:00:00 via TOPICAL

## 2015-04-17 MED ORDER — LACTATED RINGERS IV SOLN
INTRAVENOUS | Status: DC | PRN
  Administered 2015-04-17 (×2): via INTRAVENOUS

## 2015-04-17 MED ORDER — PHENYLEPHRINE 40 MCG/ML (10ML) SYRINGE FOR IV PUSH (FOR BLOOD PRESSURE SUPPORT)
PREFILLED_SYRINGE | INTRAVENOUS | Status: AC
  Filled 2015-04-17: qty 10

## 2015-04-17 MED ORDER — FENTANYL CITRATE (PF) 250 MCG/5ML IJ SOLN
INTRAMUSCULAR | Status: AC
  Filled 2015-04-17: qty 5

## 2015-04-17 MED ORDER — PROPOFOL 10 MG/ML IV BOLUS
INTRAVENOUS | Status: DC | PRN
  Administered 2015-04-17: 50 mg via INTRAVENOUS

## 2015-04-17 MED ORDER — FENTANYL CITRATE (PF) 100 MCG/2ML IJ SOLN
INTRAMUSCULAR | Status: DC | PRN
  Administered 2015-04-17: 50 ug via INTRAVENOUS
  Administered 2015-04-17: 100 ug via INTRAVENOUS
  Administered 2015-04-17 (×2): 50 ug via INTRAVENOUS

## 2015-04-17 MED ORDER — PHENYLEPHRINE HCL 10 MG/ML IJ SOLN
INTRAMUSCULAR | Status: DC | PRN
  Administered 2015-04-17 (×3): 80 ug via INTRAVENOUS
  Administered 2015-04-17 (×2): 40 ug via INTRAVENOUS
  Administered 2015-04-17 (×2): 80 ug via INTRAVENOUS
  Administered 2015-04-17: 40 ug via INTRAVENOUS
  Administered 2015-04-17: 80 ug via INTRAVENOUS
  Administered 2015-04-17: 40 ug via INTRAVENOUS

## 2015-04-17 MED ORDER — ROCURONIUM BROMIDE 50 MG/5ML IV SOLN
INTRAVENOUS | Status: AC
  Filled 2015-04-17: qty 1

## 2015-04-17 MED ORDER — ROCURONIUM BROMIDE 50 MG/5ML IV SOLN
INTRAVENOUS | Status: AC
  Filled 2015-04-17: qty 3

## 2015-04-17 MED ORDER — 0.9 % SODIUM CHLORIDE (POUR BTL) OPTIME
TOPICAL | Status: DC | PRN
  Administered 2015-04-17: 1000 mL

## 2015-04-17 MED ORDER — SUCCINYLCHOLINE CHLORIDE 20 MG/ML IJ SOLN
INTRAMUSCULAR | Status: AC
  Filled 2015-04-17: qty 1

## 2015-04-17 MED ORDER — FREE WATER
200.0000 mL | Freq: Three times a day (TID) | Status: DC
  Administered 2015-04-17 – 2015-04-19 (×5): 200 mL

## 2015-04-17 MED ORDER — ROCURONIUM BROMIDE 100 MG/10ML IV SOLN
INTRAVENOUS | Status: DC | PRN
  Administered 2015-04-17 (×3): 10 mg via INTRAVENOUS
  Administered 2015-04-17: 20 mg via INTRAVENOUS
  Administered 2015-04-17: 50 mg via INTRAVENOUS
  Administered 2015-04-17: 20 mg via INTRAVENOUS
  Administered 2015-04-17: 10 mg via INTRAVENOUS
  Administered 2015-04-17: 30 mg via INTRAVENOUS
  Administered 2015-04-17 (×2): 20 mg via INTRAVENOUS
  Administered 2015-04-17: 10 mg via INTRAVENOUS
  Administered 2015-04-17: 30 mg via INTRAVENOUS
  Administered 2015-04-17 (×2): 20 mg via INTRAVENOUS

## 2015-04-17 MED ORDER — SODIUM CHLORIDE 0.9 % IV SOLN
Freq: Once | INTRAVENOUS | Status: AC
  Administered 2015-04-17: 09:00:00 via INTRAVENOUS

## 2015-04-17 MED ORDER — THROMBIN 20000 UNITS EX SOLR
CUTANEOUS | Status: DC | PRN
  Administered 2015-04-17: 09:00:00 via TOPICAL

## 2015-04-17 MED ORDER — BUPIVACAINE-EPINEPHRINE 0.5% -1:200000 IJ SOLN
INTRAMUSCULAR | Status: DC | PRN
  Administered 2015-04-17: 20 mL

## 2015-04-17 SURGICAL SUPPLY — 96 items
BENZOIN TINCTURE PRP APPL 2/3 (GAUZE/BANDAGES/DRESSINGS) ×3 IMPLANT
BIT DRILL 3.5 SHORT (BIT) ×2 IMPLANT
BIT DRILL 3.5MM SHORT (BIT) ×1
BLADE 10 SAFETY STRL DISP (BLADE) ×6 IMPLANT
BLADE 15 SAFETY STRL DISP (BLADE) ×3 IMPLANT
BLADE CLIPPER SURG (BLADE) IMPLANT
BLADE SURG 11 STRL SS (BLADE) IMPLANT
BLADE ULTRA TIP 2M (BLADE) IMPLANT
BRUSH SCRUB EZ 1% IODOPHOR (MISCELLANEOUS) IMPLANT
BRUSH SCRUB EZ PLAIN DRY (MISCELLANEOUS) IMPLANT
BUR MATCHSTICK NEURO 3.0 LAGG (BURR) ×6 IMPLANT
CANISTER SUCT 3000ML PPV (MISCELLANEOUS) ×6 IMPLANT
CAP LOCKING (Cap) ×13 IMPLANT
CLOSURE WOUND 1/2 X4 (GAUZE/BANDAGES/DRESSINGS) ×1
CORDS BIPOLAR (ELECTRODE) ×3 IMPLANT
COVER MAYO STAND STRL (DRAPES) ×9 IMPLANT
DECANTER SPIKE VIAL GLASS SM (MISCELLANEOUS) IMPLANT
DRAIN SNY 10X20 3/4 PERF (WOUND CARE) ×3 IMPLANT
DRAPE C-ARM 42X72 X-RAY (DRAPES) ×12 IMPLANT
DRAPE INCISE IOBAN 66X45 STRL (DRAPES) ×3 IMPLANT
DRAPE LAPAROTOMY 100X72 PEDS (DRAPES) ×9 IMPLANT
DRAPE MICROSCOPE LEICA (MISCELLANEOUS) ×6 IMPLANT
DRAPE ORTHO SPLIT 77X108 STRL (DRAPES)
DRAPE POUCH INSTRU U-SHP 10X18 (DRAPES) ×9 IMPLANT
DRAPE SURG ORHT 6 SPLT 77X108 (DRAPES) IMPLANT
DRSG OPSITE POSTOP 4X10 (GAUZE/BANDAGES/DRESSINGS) ×3 IMPLANT
DRSG OPSITE POSTOP 4X6 (GAUZE/BANDAGES/DRESSINGS) ×3 IMPLANT
DURAPREP 26ML APPLICATOR (WOUND CARE) ×3 IMPLANT
DURAPREP 6ML APPLICATOR 50/CS (WOUND CARE) ×6 IMPLANT
ELECT CAUTERY BLADE 6.4 (BLADE) ×3 IMPLANT
ELECT REM PT RETURN 9FT ADLT (ELECTROSURGICAL) ×9
ELECTRODE REM PT RTRN 9FT ADLT (ELECTROSURGICAL) ×3 IMPLANT
EVACUATOR 3/16  PVC DRAIN (DRAIN) ×2
EVACUATOR 3/16 PVC DRAIN (DRAIN) ×1 IMPLANT
EVACUATOR SILICONE 100CC (DRAIN) ×6 IMPLANT
GAUZE SPONGE 4X4 12PLY STRL (GAUZE/BANDAGES/DRESSINGS) ×3 IMPLANT
GAUZE SPONGE 4X4 16PLY XRAY LF (GAUZE/BANDAGES/DRESSINGS) ×9 IMPLANT
GLOVE BIOGEL M 8.0 STRL (GLOVE) ×6 IMPLANT
GLOVE BIOGEL PI IND STRL 8 (GLOVE) ×2 IMPLANT
GLOVE BIOGEL PI INDICATOR 8 (GLOVE) ×4
GLOVE ECLIPSE 7.5 STRL STRAW (GLOVE) ×9 IMPLANT
GOWN STRL REUS W/ TWL LRG LVL3 (GOWN DISPOSABLE) ×2 IMPLANT
GOWN STRL REUS W/ TWL XL LVL3 (GOWN DISPOSABLE) ×1 IMPLANT
GOWN STRL REUS W/TWL 2XL LVL3 (GOWN DISPOSABLE) ×12 IMPLANT
GOWN STRL REUS W/TWL LRG LVL3 (GOWN DISPOSABLE) ×4
GOWN STRL REUS W/TWL XL LVL3 (GOWN DISPOSABLE) ×2
HEMOSTAT POWDER KIT SURGIFOAM (HEMOSTASIS) ×3 IMPLANT
IMPL QUARTEX 3.5X12MM (Neuro Prosthesis/Implant) ×7 IMPLANT
IMPLANT QUARTEX 3.5X12MM (Neuro Prosthesis/Implant) ×21 IMPLANT
KIT BASIN OR (CUSTOM PROCEDURE TRAY) ×6 IMPLANT
KIT INFUSE LRG II (Orthopedic Implant) ×3 IMPLANT
KIT ROOM TURNOVER OR (KITS) ×6 IMPLANT
LOCKING CAP (Cap) ×39 IMPLANT
NEEDLE HYPO 18GX1.5 BLUNT FILL (NEEDLE) IMPLANT
NEEDLE SPNL 22GX3.5 QUINCKE BK (NEEDLE) ×3 IMPLANT
NS IRRIG 1000ML POUR BTL (IV SOLUTION) ×6 IMPLANT
PACK FOAM VITOSS 10CC (Orthopedic Implant) ×6 IMPLANT
PACK LAMINECTOMY NEURO (CUSTOM PROCEDURE TRAY) ×6 IMPLANT
PAD ARMBOARD 7.5X6 YLW CONV (MISCELLANEOUS) ×12 IMPLANT
PAD SHARPS MAGNETIC DISPOSAL (MISCELLANEOUS) ×3 IMPLANT
PATTIES SURGICAL .25X.25 (GAUZE/BANDAGES/DRESSINGS) IMPLANT
PATTIES SURGICAL .5 X1 (DISPOSABLE) IMPLANT
PENCIL BUTTON HOLSTER BLD 10FT (ELECTRODE) ×3 IMPLANT
PIN MAYFIELD SKULL DISP (PIN) IMPLANT
PLATE 18MM (Plate) ×2 IMPLANT
ROD 3.5X240MM (Rod) ×3 IMPLANT
ROD 4.0X240MM (Rod) ×2 IMPLANT
RUBBERBAND STERILE (MISCELLANEOUS) ×12 IMPLANT
SCREW 4.0X18MM (Screw) IMPLANT
SCREW 4.0X28MM (Screw) ×6 IMPLANT
SCREW 4.0X30MM (Screw) ×8 IMPLANT
SCREW 4.2X14 (Screw) ×3 IMPLANT
SCREW POLYAXIAL QUARTEX 4X12 (Screw) IMPLANT
SCREW POLYAXIAL QUARTEX 4X14 (Screw) ×3 IMPLANT
SCREW SELF-TAP 4.6X14MM ANGLE (Screw) ×9 IMPLANT
SCREW SPINAL 4.0X30MM (Screw) ×4 IMPLANT
SPACER CERV FRGE 12X14X12-7 (Spacer) ×3 IMPLANT
SPONGE INTESTINAL PEANUT (DISPOSABLE) ×3 IMPLANT
SPONGE LAP 4X18 X RAY DECT (DISPOSABLE) ×6 IMPLANT
SPONGE SURGIFOAM ABS GEL 100 (HEMOSTASIS) ×3 IMPLANT
SPONGE SURGIFOAM ABS GEL SZ50 (HEMOSTASIS) IMPLANT
STAPLER SKIN PROX WIDE 3.9 (STAPLE) IMPLANT
STRIP CLOSURE SKIN 1/2X4 (GAUZE/BANDAGES/DRESSINGS) ×2 IMPLANT
SUT ETHILON 3 0 FSL (SUTURE) IMPLANT
SUT ETHILON 4 0 PS 2 18 (SUTURE) IMPLANT
SUT VIC AB 0 CT1 18XCR BRD 8 (SUTURE) ×2 IMPLANT
SUT VIC AB 0 CT1 18XCR BRD8 (SUTURE) ×1 IMPLANT
SUT VIC AB 0 CT1 8-18 (SUTURE) ×6
SUT VIC AB 2-0 CP2 18 (SUTURE) ×3 IMPLANT
SUT VIC AB 3-0 SH 8-18 (SUTURE) ×12 IMPLANT
SYR 3ML LL SCALE MARK (SYRINGE) IMPLANT
TOWEL OR 17X24 6PK STRL BLUE (TOWEL DISPOSABLE) ×3 IMPLANT
TOWEL OR 17X26 10 PK STRL BLUE (TOWEL DISPOSABLE) ×9 IMPLANT
TUBING BULK SUCTION (MISCELLANEOUS) ×3 IMPLANT
UNDERPAD 30X30 INCONTINENT (UNDERPADS AND DIAPERS) IMPLANT
WATER STERILE IRR 1000ML POUR (IV SOLUTION) ×6 IMPLANT

## 2015-04-17 NOTE — Progress Notes (Signed)
abg collected  

## 2015-04-17 NOTE — Progress Notes (Signed)
Pink tape replaced with Hollister/tube holder, RN assist. Pt is stable at this time.
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
## 2015-04-17 NOTE — Progress Notes (Signed)
Pt. Was in the OR during 12:00 rounds.

## 2015-04-17 NOTE — Progress Notes (Signed)
Anterior and posterior cervical fusion

## 2015-04-17 NOTE — Progress Notes (Signed)
Patient currently in the OR with Dr. Joya Salm.  I understand this may be a long surgery.  Will see if he comes back to the unit or will have on call doctor eval him later.  Jomarie Longs, PA-C General Surgery [facility] Surgery [phone]

## 2015-04-17 NOTE — Progress Notes (Signed)
Pt has a cuff leak, pulling good volumes but expiratory volumes there is a difference of 100-160mls related to inspiratory volumes. Pt is stable at this time. RN aware and NP aware.

## 2015-04-17 NOTE — Transfer of Care (Signed)
Immediate Anesthesia Transfer of Care Note  Patient: [name]  Procedure(s) Performed: Procedure(s) with comments: CERVICAL FOUR-FIVE ANTERIOR CERVICAL DISCECTOMY FUSION (N/A) - C4-5 Anterior cervical decompression/diskectomy/fusion POSTERIOR CERVICAL FUSION/FORAMINOTOMY CERVICAL THREE-THORACIC THREE (N/A)  Patient Location: ICU  Anesthesia Type:General  Level of Consciousness: Patient remains intubated per anesthesia plan  Airway & Oxygen Therapy: Patient remains intubated per anesthesia plan  Post-op Assessment: Report given to RN and Post -op Vital signs reviewed and stable  Post vital signs: Reviewed and stable  Last Vitals:  Filed Vitals:   04/17/15 0717 04/17/15 0800  BP: 101/64 99/86  Pulse: 93 103  Temp:  37.7 C  Resp: 18 18    Complications: No apparent anesthesia complications

## 2015-04-17 NOTE — Anesthesia Procedure Notes (Signed)
Procedure Name: Intubation Date/Time: 04/17/2015 3:48 PM Performed by: Josephine Igo Pre-anesthesia Checklist: Patient identified, Suction available, Patient being monitored and Emergency Drugs available Oxygen Delivery Method: Circle system utilized Intubation Type: Inhalational induction with existing ETT Grade View: Grade I Tube type: Subglottic suction tube Tube size: 8.0 mm Number of attempts: 1 Airway Equipment and Method: Bougie stylet and Video-laryngoscopy Placement Confirmation: ETT inserted through vocal cords under direct vision,  positive ETCO2 and breath sounds checked- equal and bilateral Secured at: 23 cm Tube secured with: Tape Dental Injury: Teeth and Oropharynx as per pre-operative assessment  Difficulty Due To: Difficult Airway-  due to neck instability Comments: 7.5 Subglottic tube had persistent cuff leak. ETT changed with Cook tube exchange catheter and patient reintubated with 8.0 subglottic ETT. BS=bilaterally. OGT replaced. Will get CXR for confirmation of tube placement.

## 2015-04-17 NOTE — Anesthesia Postprocedure Evaluation (Signed)
Anesthesia Post Note  Patient: [name]  Procedure(s) Performed: Procedure(s) (LRB): CERVICAL FOUR-FIVE ANTERIOR CERVICAL DISCECTOMY FUSION (N/A) POSTERIOR CERVICAL FUSION/FORAMINOTOMY CERVICAL THREE-THORACIC THREE (N/A)  Patient location during evaluation: ICU Anesthesia Type: General Level of consciousness: patient remains intubated per anesthesia plan Pain management: pain level controlled Vital Signs Assessment: post-procedure vital signs reviewed and stable Respiratory status: patient on ventilator - see flowsheet for VS and patient remains intubated per anesthesia plan Cardiovascular status: blood pressure returned to baseline Postop Assessment: no signs of nausea or vomiting Anesthetic complications: no    Last Vitals:  Filed Vitals:   04/17/15 0800 04/17/15 1615  BP: 99/86 109/65  Pulse: 103 94  Temp: 37.7 C   Resp: 18 18    Last Pain:  Filed Vitals:   04/17/15 1622  PainSc: 6                  Gurtej Noyola A.
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
## 2015-04-18 ENCOUNTER — Inpatient Hospital Stay (HOSPITAL_COMMUNITY): Payer: BLUE CROSS/BLUE SHIELD

## 2015-04-18 DIAGNOSIS — G9340 Encephalopathy, unspecified: Secondary | ICD-10-CM | POA: Insufficient documentation

## 2015-04-18 LAB — TYPE AND SCREEN
ABO/RH(D): A POS
Antibody Screen: NEGATIVE
UNIT DIVISION: 0
Unit division: 0

## 2015-04-18 LAB — POCT I-STAT 3, ART BLOOD GAS (G3+)
Acid-Base Excess: 2 mmol/L (ref 0.0–2.0)
Bicarbonate: 24.2 meq/L — ABNORMAL HIGH (ref 20.0–24.0)
O2 SAT: 94 %
PCO2 ART: 30.3 mmHg — AB (ref 35.0–45.0)
PO2 ART: 69 mmHg — AB (ref 80.0–100.0)
Patient temperature: 38.9
TCO2: 25 mmol/L (ref 0–100)
pH, Arterial: 7.517 — ABNORMAL HIGH (ref 7.350–7.450)

## 2015-04-18 LAB — BASIC METABOLIC PANEL
Anion gap: 9 (ref 5–15)
BUN: 27 mg/dL — AB (ref 6–20)
CHLORIDE: 113 mmol/L — AB (ref 101–111)
CO2: 25 mmol/L (ref 22–32)
Calcium: 8.7 mg/dL — ABNORMAL LOW (ref 8.9–10.3)
Creatinine, Ser: 0.84 mg/dL (ref 0.61–1.24)
GFR calc Af Amer: >60 mL/min (ref >60)
GLUCOSE: 132 mg/dL — AB (ref 65–99)
POTASSIUM: 3.6 mmol/L (ref 3.5–5.1)
Sodium: 147 mmol/L — ABNORMAL HIGH (ref 135–145)

## 2015-04-18 LAB — POCT I-STAT 4, (NA,K, GLUC, HGB,HCT)
Glucose, Bld: 181 mg/dL — ABNORMAL HIGH (ref 65–99)
HCT: 18 % — ABNORMAL LOW (ref 39.0–52.0)
HEMOGLOBIN: 6.1 g/dL — AB (ref 13.0–17.0)
Potassium: 4 mmol/L (ref 3.5–5.1)
Sodium: 142 mmol/L (ref 135–145)

## 2015-04-18 LAB — LACTIC ACID, PLASMA: LACTIC ACID, VENOUS: 1.6 mmol/L (ref 0.5–2.0)

## 2015-04-18 LAB — MAGNESIUM: Magnesium: 2.4 mg/dL (ref 1.7–2.4)

## 2015-04-18 LAB — PROCALCITONIN: PROCALCITONIN: 3.89 ng/mL

## 2015-04-18 LAB — CBC
HCT: 26.9 % — ABNORMAL LOW (ref 39.0–52.0)
Hemoglobin: 9.1 g/dL — ABNORMAL LOW (ref 13.0–17.0)
MCH: 28.5 pg (ref 26.0–34.0)
MCHC: 33.8 g/dL (ref 30.0–36.0)
MCV: 84.3 fL (ref 78.0–100.0)
PLATELETS: 485 10*3/uL — AB (ref 150–400)
RBC: 3.19 MIL/uL — AB (ref 4.22–5.81)
RDW: 14.1 % (ref 11.5–15.5)
WBC: 11.8 10*3/uL — ABNORMAL HIGH (ref 4.0–10.5)

## 2015-04-18 LAB — CULTURE, BLOOD (ROUTINE X 2): CULTURE: NO GROWTH

## 2015-04-18 LAB — PHOSPHORUS: Phosphorus: 2.6 mg/dL (ref 2.5–4.6)

## 2015-04-18 LAB — CK: Total CK: 1085 U/L — ABNORMAL HIGH (ref 49–397)

## 2015-04-18 MED ORDER — PIPERACILLIN-TAZOBACTAM 3.375 G IVPB 30 MIN
3.3750 g | Freq: Once | INTRAVENOUS | Status: AC
  Administered 2015-04-18: 3.375 g via INTRAVENOUS
  Filled 2015-04-18: qty 50

## 2015-04-18 MED ORDER — PIPERACILLIN-TAZOBACTAM 3.375 G IVPB
3.3750 g | Freq: Three times a day (TID) | INTRAVENOUS | Status: DC
  Administered 2015-04-18 – 2015-04-22 (×12): 3.375 g via INTRAVENOUS
  Filled 2015-04-18 (×14): qty 50

## 2015-04-18 MED ORDER — PIVOT 1.5 CAL PO LIQD
1000.0000 mL | ORAL | Status: DC
  Administered 2015-04-18 – 2015-04-19 (×2): 1000 mL
  Filled 2015-04-18: qty 1000

## 2015-04-18 MED ORDER — VANCOMYCIN HCL 10 G IV SOLR
1250.0000 mg | Freq: Three times a day (TID) | INTRAVENOUS | Status: DC
  Administered 2015-04-18 – 2015-05-02 (×41): 1250 mg via INTRAVENOUS
  Filled 2015-04-18 (×45): qty 1250

## 2015-04-18 MED ORDER — PRO-STAT SUGAR FREE PO LIQD
30.0000 mL | Freq: Two times a day (BID) | ORAL | Status: DC
  Administered 2015-04-18 – 2015-04-19 (×4): 30 mL
  Filled 2015-04-18 (×4): qty 30

## 2015-04-18 MED ORDER — SODIUM CHLORIDE 0.9 % IV SOLN
25.0000 ug/h | INTRAVENOUS | Status: DC
  Administered 2015-04-18 – 2015-04-20 (×4): 250 ug/h via INTRAVENOUS
  Filled 2015-04-18 (×4): qty 50
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
## 2015-04-18 NOTE — Progress Notes (Signed)
Patient ID: [name], male   DOB: 07-13-1965, 50 y.o.   MRN: [id number] Stable, drains working well. Spoke with nurses. He can have his HOB elevated up to 90 degrees if his vital signs are stable.

## 2015-04-18 NOTE — Progress Notes (Signed)
EEG completed; results pending.    
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
## 2015-04-18 NOTE — Progress Notes (Signed)
Pt has 106.2 rectal temp.  MD paged, peripheral blood culture x2, urine culture, sputum culture ordered.  CXR done this am, Tylenol given, ice packs applied, cooling blanket ordered.

## 2015-04-18 NOTE — Progress Notes (Signed)
abg collected  

## 2015-04-18 NOTE — Progress Notes (Signed)
Patient in surgery [phone]

## 2015-04-19 ENCOUNTER — Inpatient Hospital Stay (HOSPITAL_COMMUNITY): Payer: BLUE CROSS/BLUE SHIELD

## 2015-04-19 ENCOUNTER — Encounter (HOSPITAL_COMMUNITY): Payer: Self-pay | Admitting: Neurosurgery

## 2015-04-19 DIAGNOSIS — J189 Pneumonia, unspecified organism: Secondary | ICD-10-CM

## 2015-04-19 DIAGNOSIS — A419 Sepsis, unspecified organism: Secondary | ICD-10-CM

## 2015-04-19 LAB — CBC
HCT: 24.9 % — ABNORMAL LOW (ref 39.0–52.0)
Hemoglobin: 8.2 g/dL — ABNORMAL LOW (ref 13.0–17.0)
MCH: 28.2 pg (ref 26.0–34.0)
MCHC: 32.9 g/dL (ref 30.0–36.0)
MCV: 85.6 fL (ref 78.0–100.0)
PLATELETS: 462 10*3/uL — AB (ref 150–400)
RBC: 2.91 MIL/uL — AB (ref 4.22–5.81)
RDW: 14.5 % (ref 11.5–15.5)
WBC: 15.5 10*3/uL — AB (ref 4.0–10.5)

## 2015-04-19 LAB — COMPREHENSIVE METABOLIC PANEL
ALK PHOS: 170 U/L — AB (ref 38–126)
ALT: 182 U/L — AB (ref 17–63)
AST: 123 U/L — AB (ref 15–41)
Albumin: 1.6 g/dL — ABNORMAL LOW (ref 3.5–5.0)
Anion gap: 8 (ref 5–15)
BILIRUBIN TOTAL: 0.7 mg/dL (ref 0.3–1.2)
BUN: 27 mg/dL — AB (ref 6–20)
CALCIUM: 8.8 mg/dL — AB (ref 8.9–10.3)
CO2: 26 mmol/L (ref 22–32)
CREATININE: 0.76 mg/dL (ref 0.61–1.24)
Chloride: 118 mmol/L — ABNORMAL HIGH (ref 101–111)
Glucose, Bld: 147 mg/dL — ABNORMAL HIGH (ref 65–99)
Potassium: 3.7 mmol/L (ref 3.5–5.1)
Sodium: 152 mmol/L — ABNORMAL HIGH (ref 135–145)
Total Protein: 6 g/dL — ABNORMAL LOW (ref 6.5–8.1)

## 2015-04-19 LAB — URINE CULTURE: Culture: NO GROWTH

## 2015-04-19 LAB — PROCALCITONIN: Procalcitonin: 4.86 ng/mL

## 2015-04-19 MED ORDER — FREE WATER
200.0000 mL | Freq: Four times a day (QID) | Status: DC
  Administered 2015-04-19 – 2015-04-20 (×4): 200 mL

## 2015-04-19 MED ORDER — PANTOPRAZOLE SODIUM 40 MG PO PACK
40.0000 mg | PACK | ORAL | Status: DC
  Administered 2015-04-19 – 2015-04-20 (×2): 40 mg
  Filled 2015-04-19 (×2): qty 20

## 2015-04-19 MED ORDER — DEXTROSE 5 % IV SOLN
INTRAVENOUS | Status: DC
  Administered 2015-04-19 – 2015-04-23 (×5): via INTRAVENOUS
  Administered 2015-04-24: 50 mL via INTRAVENOUS
  Administered 2015-04-26 – 2015-04-30 (×5): via INTRAVENOUS
  Administered 2015-05-01: 1000 mL via INTRAVENOUS
  Administered 2015-05-04 (×2): via INTRAVENOUS

## 2015-04-19 MED ORDER — IPRATROPIUM-ALBUTEROL 0.5-2.5 (3) MG/3ML IN SOLN: 3.0000 mL | RESPIRATORY_TRACT | Status: DC | PRN

## 2015-04-19 MED ORDER — ACETAMINOPHEN 160 MG/5ML PO SOLN
650.0000 mg | Freq: Four times a day (QID) | ORAL | Status: DC | PRN
  Administered 2015-04-22: 650 mg via ORAL
  Filled 2015-04-19: qty 20.3

## 2015-04-19 NOTE — Progress Notes (Signed)
Completed FMLA paperwork for pt's daughter, [name],  faxed to her employer, per her request.    Reinaldo Raddle, RN, BSN  Trauma/Neuro ICU Case Manager [phone]

## 2015-04-19 NOTE — Progress Notes (Signed)
Patient ID: [name], male   DOB: Apr 01, 1966, 50 y.o.   MRN: [id number] Stable. Able to flex his biceps. Open eyes . F/c. Will remove drains in am. Pt/ot to see

## 2015-04-19 NOTE — Progress Notes (Signed)
CRITICAL VALUE ALERT  Critical value received: Blood CX: Gram+ Cocci in clusters (aerobic bottle)  Date of notification:  04/19/15  Time of notification:  0705  Critical value read back:Yes.    Nurse who received alert:  Beacher May, RN  MD notified (1st page):    Time of first page:    MD notified (2nd page):  Time of second page:  Responding MD:   Time MD responded:    Reported to Buck Mam, RN (day shift) for rounding physician. Pt currently on zosyn and vancomycin
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
## 2015-04-20 ENCOUNTER — Inpatient Hospital Stay (HOSPITAL_COMMUNITY): Payer: BLUE CROSS/BLUE SHIELD

## 2015-04-20 LAB — PROCALCITONIN: Procalcitonin: 2.51 ng/mL

## 2015-04-20 LAB — BASIC METABOLIC PANEL
Anion gap: 6 (ref 5–15)
BUN: 21 mg/dL — AB (ref 6–20)
CHLORIDE: 115 mmol/L — AB (ref 101–111)
CO2: 25 mmol/L (ref 22–32)
CREATININE: 0.62 mg/dL (ref 0.61–1.24)
Calcium: 8.6 mg/dL — ABNORMAL LOW (ref 8.9–10.3)
GFR calc Af Amer: >60 mL/min (ref >60)
GFR calc non Af Amer: >60 mL/min (ref >60)
GLUCOSE: 131 mg/dL — AB (ref 65–99)
POTASSIUM: 3.6 mmol/L (ref 3.5–5.1)
Sodium: 146 mmol/L — ABNORMAL HIGH (ref 135–145)

## 2015-04-20 LAB — TRIGLYCERIDES: Triglycerides: 247 mg/dL — ABNORMAL HIGH (ref <150)

## 2015-04-20 LAB — VANCOMYCIN, TROUGH: Vancomycin Tr: 16 ug/mL (ref 10.0–20.0)

## 2015-04-20 LAB — CBC
HEMATOCRIT: 24.7 % — AB (ref 39.0–52.0)
Hemoglobin: 8 g/dL — ABNORMAL LOW (ref 13.0–17.0)
MCH: 27.8 pg (ref 26.0–34.0)
MCHC: 32.4 g/dL (ref 30.0–36.0)
MCV: 85.8 fL (ref 78.0–100.0)
Platelets: 501 10*3/uL — ABNORMAL HIGH (ref 150–400)
RBC: 2.88 MIL/uL — ABNORMAL LOW (ref 4.22–5.81)
RDW: 14.8 % (ref 11.5–15.5)
WBC: 18.2 10*3/uL — ABNORMAL HIGH (ref 4.0–10.5)

## 2015-04-20 MED ORDER — PANTOPRAZOLE SODIUM 40 MG IV SOLR
40.0000 mg | INTRAVENOUS | Status: DC
  Administered 2015-04-21: 40 mg via INTRAVENOUS
  Filled 2015-04-20 (×2): qty 40

## 2015-04-20 MED ORDER — ACETAMINOPHEN 650 MG RE SUPP
650.0000 mg | RECTAL | Status: DC | PRN
  Administered 2015-04-29 – 2015-05-02 (×2): 650 mg via RECTAL
  Filled 2015-04-20 (×2): qty 1

## 2015-04-20 MED ORDER — ALBUTEROL SULFATE (2.5 MG/3ML) 0.083% IN NEBU
2.5000 mg | INHALATION_SOLUTION | RESPIRATORY_TRACT | Status: DC | PRN
  Administered 2015-04-20 – 2015-04-29 (×4): 2.5 mg via RESPIRATORY_TRACT
  Filled 2015-04-20 (×4): qty 3

## 2015-04-20 MED ORDER — WHITE PETROLATUM GEL
Status: AC
  Administered 2015-04-20: 0.2
  Filled 2015-04-20: qty 1

## 2015-04-20 MED ORDER — FENTANYL CITRATE (PF) 100 MCG/2ML IJ SOLN
50.0000 ug | INTRAMUSCULAR | Status: DC | PRN
  Administered 2015-04-20 – 2015-04-21 (×7): 100 ug via INTRAVENOUS
  Filled 2015-04-20 (×7): qty 2

## 2015-04-20 MED ORDER — IPRATROPIUM-ALBUTEROL 0.5-2.5 (3) MG/3ML IN SOLN
3.0000 mL | Freq: Four times a day (QID) | RESPIRATORY_TRACT | Status: DC
  Administered 2015-04-20 – 2015-04-22 (×10): 3 mL via RESPIRATORY_TRACT
  Filled 2015-04-20 (×10): qty 3

## 2015-04-20 NOTE — Progress Notes (Signed)
Call from RN, pt states he's feeling more SOB. WOB doesn't appear to have gotten worse, but SpO2 has dropped slightly to 92% on 5L [place]. 55%/14L Venti mask placed on pt just for the extra flow to see if that helps the patient. After wearing the mask for a few minutes, he does say his breathing is a little better. Currently, RR 22, SpO2 97%, HR 84, BP 107/70. Pt appears more comfortable at this time RT will continue to monitor.

## 2015-04-20 NOTE — Progress Notes (Signed)
Wasted 175cc of fentanyl with Sharren Bridge, Therapist, sports.

## 2015-04-20 NOTE — Progress Notes (Signed)
Pharmacy Antibiotic Follow-up Note  [name] is a 50 y.o. year-old male admitted on 04/05/2015.  The patient is currently on day 3 of abx for sepsis.  Assessment/Plan: S/p abx for MRSA PNA. Now to restart abx for sepsis on 1/11. Day #3 of abx for sepsis. Tmax up to 100. WBC trending up to 18.2. VT today is therapeutic at 16 (Goal 15-20) SCr stable, CrCl > 121ml/min. UOP good.   Plan: Continue Zosyn 3.375 gm IV q8h (4 hour infusion) Continue vancomycin 1,250mg  IV Q8 Monitor clinical picture, renal function, VT prn F/U C&S, abx deescalation / LOT   Temp (24hrs), Avg:99.8 F (37.7 C), Min:99.5 F (37.5 C), Max:100 F (37.8 C)   Recent Labs Lab 04/14/15 0415 04/16/15 1010 04/18/15 0409 04/19/15 0310 04/20/15 0600  WBC 13.3* 14.6* 11.8* 15.5* 18.2*    Recent Labs Lab 04/14/15 0415 04/16/15 1010 04/18/15 0409 04/19/15 0310 04/20/15 0600  CREATININE 0.69 0.52* 0.84 0.76 0.62   Estimated Creatinine Clearance: 121.3 mL/min (by C-G formula based on Cr of 0.62).    Allergies  Allergen Reactions  . Other Hives    From work uniform  . Shellfish Allergy Swelling    Lip swelling    Antimicrobials this admission: Zosyn 12/29>>1/4;  1/11 >> Vanc 1/1>>1/8;  1/11 >> Cefepime 1/6>>1/8  Levels/dose changes this admission: 1/3 VT: 10 on 1g q8h 1/4 VT: 17 on 1250 q8h 1/13 VT: 16 on 1250mg  Q8  Microbiology results: 1/1 resp cx >> MRSA (S-vanc) MRSA pcr neg 1/6 urine 1/6 bld x2 > 1/2 CONS 1/11 Blood cx > 1/2 GPC in clusters 1/11 Urine cx > ngF 1/11 Resp cx > pending  Thank you for allowing pharmacy to be a part of this patient's care.  Elenor Quinones, PharmD, BCPS Clinical Pharmacist Pager [phone] 04/20/2015 10:47 AM
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
## 2015-04-20 NOTE — Procedures (Signed)
Extubation Procedure Note  Patient Details:   Name: JIAIR JOANIS DOB: 11-27-1965 MRN: [id number]   Airway Documentation:     Evaluation  O2 sats: stable throughout Complications: No apparent complications Patient did tolerate procedure well. Bilateral Breath Sounds: Rhonchi Suctioning: Nasal Yes   Pt tolerated wean, positive for cuff leak, extubated to 5L [place]. No stridor noted after extubation. RT will continue to monitor.   Mariam Dollar 04/20/2015, 11:26 AM
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
## 2015-04-20 NOTE — Progress Notes (Signed)
Pt requiring frequent NTS to clear secretions. Size 6 nasal trumpet inserted without difficulty to assist with suctioning. Pt tolerated well. RT will continue to monitor.

## 2015-04-21 ENCOUNTER — Inpatient Hospital Stay (HOSPITAL_COMMUNITY): Payer: BLUE CROSS/BLUE SHIELD

## 2015-04-21 ENCOUNTER — Inpatient Hospital Stay (HOSPITAL_COMMUNITY): Payer: BLUE CROSS/BLUE SHIELD | Admitting: Anesthesiology

## 2015-04-21 DIAGNOSIS — G9340 Encephalopathy, unspecified: Secondary | ICD-10-CM

## 2015-04-21 DIAGNOSIS — G8254 Quadriplegia, C5-C7 incomplete: Secondary | ICD-10-CM

## 2015-04-21 LAB — CULTURE, BLOOD (ROUTINE X 2)

## 2015-04-21 LAB — BLOOD GAS, ARTERIAL
Acid-Base Excess: 0.9 mmol/L (ref 0.0–2.0)
BICARBONATE: 24.6 meq/L — AB (ref 20.0–24.0)
FIO2: 0.4
O2 Saturation: 96.7 %
PCO2 ART: 36.8 mmHg (ref 35.0–45.0)
PEEP: 5 cmH2O
Patient temperature: 98.6
RATE: 14 resp/min
TCO2: 25.7 mmol/L (ref 0–100)
VT: 620 mL
pH, Arterial: 7.44 (ref 7.350–7.450)
pO2, Arterial: 90.5 mmHg (ref 80.0–100.0)

## 2015-04-21 LAB — CULTURE, RESPIRATORY: SPECIAL REQUESTS: NORMAL

## 2015-04-21 LAB — BASIC METABOLIC PANEL
Anion gap: 9 (ref 5–15)
BUN: 20 mg/dL (ref 6–20)
CALCIUM: 9.2 mg/dL (ref 8.9–10.3)
CO2: 26 mmol/L (ref 22–32)
Chloride: 111 mmol/L (ref 101–111)
Creatinine, Ser: 0.53 mg/dL — ABNORMAL LOW (ref 0.61–1.24)
GFR calc Af Amer: >60 mL/min (ref >60)
GLUCOSE: 97 mg/dL (ref 65–99)
Potassium: 4.1 mmol/L (ref 3.5–5.1)
SODIUM: 146 mmol/L — AB (ref 135–145)

## 2015-04-21 LAB — CBC
HCT: 25 % — ABNORMAL LOW (ref 39.0–52.0)
Hemoglobin: 8.2 g/dL — ABNORMAL LOW (ref 13.0–17.0)
MCH: 28.4 pg (ref 26.0–34.0)
MCHC: 32.8 g/dL (ref 30.0–36.0)
MCV: 86.5 fL (ref 78.0–100.0)
PLATELETS: 511 10*3/uL — AB (ref 150–400)
RBC: 2.89 MIL/uL — ABNORMAL LOW (ref 4.22–5.81)
RDW: 14.8 % (ref 11.5–15.5)
WBC: 20.7 10*3/uL — AB (ref 4.0–10.5)

## 2015-04-21 LAB — CULTURE, RESPIRATORY W GRAM STAIN

## 2015-04-21 MED ORDER — FENTANYL CITRATE (PF) 100 MCG/2ML IJ SOLN
50.0000 ug | Freq: Once | INTRAMUSCULAR | Status: AC
  Administered 2015-04-21: 50 ug via INTRAVENOUS

## 2015-04-21 MED ORDER — ATROPINE SULFATE 0.1 MG/ML IJ SOLN
INTRAMUSCULAR | Status: AC
  Administered 2015-04-21: 1 mg
  Filled 2015-04-21: qty 10

## 2015-04-21 MED ORDER — MIDAZOLAM HCL 2 MG/2ML IJ SOLN
2.0000 mg | INTRAMUSCULAR | Status: DC | PRN
  Administered 2015-04-21 – 2015-05-03 (×3): 2 mg via INTRAVENOUS
  Filled 2015-04-21 (×3): qty 2

## 2015-04-21 MED ORDER — FENTANYL BOLUS VIA INFUSION
50.0000 ug | INTRAVENOUS | Status: DC | PRN
Start: 2015-04-21 — End: 2015-05-08
  Administered 2015-04-23 – 2015-05-04 (×10): 50 ug via INTRAVENOUS
  Filled 2015-04-21: qty 50

## 2015-04-21 MED ORDER — FENTANYL CITRATE (PF) 100 MCG/2ML IJ SOLN
50.0000 ug | INTRAMUSCULAR | Status: DC | PRN
  Administered 2015-04-21 (×2): 50 ug via INTRAVENOUS
  Filled 2015-04-21 (×2): qty 2

## 2015-04-21 MED ORDER — FENTANYL CITRATE (PF) 2500 MCG/50ML IJ SOLN
25.0000 ug/h | INTRAMUSCULAR | Status: DC
  Administered 2015-04-21: 50 ug/h via INTRAVENOUS
  Administered 2015-04-21: 225 ug/h via INTRAVENOUS
  Administered 2015-04-22: 125 ug/h via INTRAVENOUS
  Administered 2015-04-23: 140 ug/h via INTRAVENOUS
  Administered 2015-04-24: 125 ug/h via INTRAVENOUS
  Administered 2015-04-27 – 2015-04-29 (×5): 225 ug/h via INTRAVENOUS
  Administered 2015-04-30: 100 ug/h via INTRAVENOUS
  Administered 2015-05-03: 125 ug/h via INTRAVENOUS
  Administered 2015-05-04: 50 ug/h via INTRAVENOUS
  Administered 2015-05-06: 25 ug/h via INTRAVENOUS
  Filled 2015-04-21 (×20): qty 50

## 2015-04-21 MED ORDER — MIDAZOLAM HCL 2 MG/2ML IJ SOLN
2.0000 mg | INTRAMUSCULAR | Status: DC | PRN
  Administered 2015-04-21 (×2): 2 mg via INTRAVENOUS
  Filled 2015-04-21 (×2): qty 2

## 2015-04-21 MED ORDER — FENTANYL CITRATE (PF) 100 MCG/2ML IJ SOLN: 50.0000 ug | INTRAMUSCULAR | Status: DC | PRN
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
## 2015-04-21 NOTE — Clinical Social Work Note (Signed)
CSW attempted to complete SBIRT on patient, patient not medically appropriate for SBIRT at this time will try at a later time  [name]. [place], MSW, [place] 04/21/2015 4:34 PM .

## 2015-04-21 NOTE — Progress Notes (Signed)
   04/21/15 1121  Clinical Encounter Type  Visited With Patient;Family;Health care provider  Visit Type Initial;Code;Critical Care  Referral From Nurse  Spiritual Encounters  Spiritual Needs Prayer;Emotional  Stress Factors  Family Stress Factors Health changes;Exhausted   Chaplain responded to a code blue. Chaplain met with family upon arrival, and offered support and prayer. Chaplain support available as needed.   Jeri Lager, Chaplain 04/21/2015 11:23 AM
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
## 2015-04-21 NOTE — Progress Notes (Signed)
While at bedside, patient's oxygenation dropped, became bradycardic (30's), and unresponsive. Code was initiated, patient ventilated with bag-valve mask, given Atropine 1 mg, but did not progress to asystole and CPR not performed. Anesthesia arrived and intubated patient emergently. HR and oxygenation rebounded, patient became alert and followed commands. Family alerted of event, spoke with MD, and at bedside. Will continue to monitor.

## 2015-04-21 NOTE — Progress Notes (Signed)
Patient ID: [name], male   DOB: Aug 18, 1965, 50 y.o.   MRN: [id number] Following commands. Flexes both arms. Continue pulmonary aspiration for secretions.will get rehabilitation medicine consult

## 2015-04-22 ENCOUNTER — Inpatient Hospital Stay (HOSPITAL_COMMUNITY): Payer: BLUE CROSS/BLUE SHIELD

## 2015-04-22 LAB — BASIC METABOLIC PANEL
ANION GAP: 10 (ref 5–15)
BUN: 21 mg/dL — ABNORMAL HIGH (ref 6–20)
CHLORIDE: 109 mmol/L (ref 101–111)
CO2: 26 mmol/L (ref 22–32)
CREATININE: 0.62 mg/dL (ref 0.61–1.24)
Calcium: 9.1 mg/dL (ref 8.9–10.3)
GFR calc non Af Amer: >60 mL/min (ref >60)
Glucose, Bld: 106 mg/dL — ABNORMAL HIGH (ref 65–99)
POTASSIUM: 3.6 mmol/L (ref 3.5–5.1)
SODIUM: 145 mmol/L (ref 135–145)

## 2015-04-22 LAB — GLUCOSE, CAPILLARY
GLUCOSE-CAPILLARY: 119 mg/dL — AB (ref 65–99)
GLUCOSE-CAPILLARY: 125 mg/dL — AB (ref 65–99)

## 2015-04-22 LAB — CBC
HCT: 24.6 % — ABNORMAL LOW (ref 39.0–52.0)
HEMOGLOBIN: 8.1 g/dL — AB (ref 13.0–17.0)
MCH: 28.3 pg (ref 26.0–34.0)
MCHC: 32.9 g/dL (ref 30.0–36.0)
MCV: 86 fL (ref 78.0–100.0)
Platelets: 548 10*3/uL — ABNORMAL HIGH (ref 150–400)
RBC: 2.86 MIL/uL — ABNORMAL LOW (ref 4.22–5.81)
RDW: 14.6 % (ref 11.5–15.5)
WBC: 16.4 10*3/uL — AB (ref 4.0–10.5)

## 2015-04-22 LAB — PHOSPHORUS: PHOSPHORUS: 4.8 mg/dL — AB (ref 2.5–4.6)

## 2015-04-22 LAB — MAGNESIUM: MAGNESIUM: 2.2 mg/dL (ref 1.7–2.4)

## 2015-04-22 MED ORDER — PIVOT 1.5 CAL PO LIQD: 1000.0000 mL | ORAL | Status: DC

## 2015-04-22 MED ORDER — PANTOPRAZOLE SODIUM 40 MG PO PACK
40.0000 mg | PACK | ORAL | Status: DC
  Administered 2015-04-22 – 2015-05-08 (×16): 40 mg
  Filled 2015-04-22 (×17): qty 20

## 2015-04-22 MED ORDER — ACETAMINOPHEN 160 MG/5ML PO SOLN
650.0000 mg | Freq: Four times a day (QID) | ORAL | Status: DC | PRN
  Administered 2015-04-22 – 2015-05-07 (×19): 650 mg via ORAL
  Filled 2015-04-22 (×19): qty 20.3

## 2015-04-22 MED ORDER — PRO-STAT SUGAR FREE PO LIQD
30.0000 mL | Freq: Two times a day (BID) | ORAL | Status: DC
  Administered 2015-04-22 – 2015-04-23 (×3): 30 mL via ORAL
  Filled 2015-04-22 (×3): qty 30

## 2015-04-22 MED ORDER — BISACODYL 10 MG RE SUPP
10.0000 mg | Freq: Every day | RECTAL | Status: DC | PRN
  Administered 2015-04-26 – 2015-05-04 (×4): 10 mg via RECTAL
  Filled 2015-04-22 (×4): qty 1

## 2015-04-22 MED ORDER — PIVOT 1.5 CAL PO LIQD
1000.0000 mL | ORAL | Status: DC
  Administered 2015-04-22 – 2015-04-23 (×2): 1000 mL

## 2015-04-22 NOTE — Progress Notes (Signed)
Patient ID: [name], male   DOB: 02/26/1966, 50 y.o.   MRN: [id number] i was here yesterday when he was intubated because respiratory distress, spoke with family BUT i have not seen his wife since before surgery. i have been talking with other members of his family. He will need tracheostomy.again i do not believe that he will walk again as wised by family
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
## 2015-04-23 ENCOUNTER — Inpatient Hospital Stay (HOSPITAL_COMMUNITY): Payer: BLUE CROSS/BLUE SHIELD

## 2015-04-23 DIAGNOSIS — R451 Restlessness and agitation: Secondary | ICD-10-CM

## 2015-04-23 LAB — BASIC METABOLIC PANEL
ANION GAP: 6 (ref 5–15)
BUN: 21 mg/dL — AB (ref 6–20)
CO2: 27 mmol/L (ref 22–32)
Calcium: 8.6 mg/dL — ABNORMAL LOW (ref 8.9–10.3)
Chloride: 112 mmol/L — ABNORMAL HIGH (ref 101–111)
Creatinine, Ser: 0.56 mg/dL — ABNORMAL LOW (ref 0.61–1.24)
GFR calc Af Amer: >60 mL/min (ref >60)
GFR calc non Af Amer: >60 mL/min (ref >60)
GLUCOSE: 131 mg/dL — AB (ref 65–99)
POTASSIUM: 3.5 mmol/L (ref 3.5–5.1)
Sodium: 145 mmol/L (ref 135–145)

## 2015-04-23 LAB — GLUCOSE, CAPILLARY
GLUCOSE-CAPILLARY: 137 mg/dL — AB (ref 65–99)
GLUCOSE-CAPILLARY: 120 mg/dL — AB (ref 65–99)
Glucose-Capillary: 123 mg/dL — ABNORMAL HIGH (ref 65–99)
Glucose-Capillary: 122 mg/dL — ABNORMAL HIGH (ref 65–99)
Glucose-Capillary: 131 mg/dL — ABNORMAL HIGH (ref 65–99)

## 2015-04-23 LAB — CULTURE, BLOOD (ROUTINE X 2): Culture: NO GROWTH

## 2015-04-23 LAB — CBC
HEMATOCRIT: 24.2 % — AB (ref 39.0–52.0)
HEMOGLOBIN: 7.9 g/dL — AB (ref 13.0–17.0)
MCH: 27.9 pg (ref 26.0–34.0)
MCHC: 32.6 g/dL (ref 30.0–36.0)
MCV: 85.5 fL (ref 78.0–100.0)
Platelets: 574 10*3/uL — ABNORMAL HIGH (ref 150–400)
RBC: 2.83 MIL/uL — ABNORMAL LOW (ref 4.22–5.81)
RDW: 14.4 % (ref 11.5–15.5)
WBC: 16.3 10*3/uL — ABNORMAL HIGH (ref 4.0–10.5)

## 2015-04-23 MED ORDER — PIVOT 1.5 CAL PO LIQD: 1000.0000 mL | ORAL | Status: DC

## 2015-04-23 MED ORDER — FUROSEMIDE 10 MG/ML IJ SOLN
20.0000 mg | Freq: Once | INTRAMUSCULAR | Status: AC
  Administered 2015-04-23: 20 mg via INTRAVENOUS
  Filled 2015-04-23: qty 2

## 2015-04-23 MED ORDER — POTASSIUM CHLORIDE 10 MEQ/50ML IV SOLN
10.0000 meq | INTRAVENOUS | Status: AC
  Administered 2015-04-23 (×2): 10 meq via INTRAVENOUS
  Filled 2015-04-23 (×2): qty 50

## 2015-04-23 NOTE — Progress Notes (Signed)
Patient ID: [name], male   DOB: 07-23-1965, 50 y.o.   MRN: [id number] Stable, on respirator  Can have trach or peg anytime
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
## 2015-04-23 NOTE — Progress Notes (Addendum)
2318- pt had 16 beat run of VT. Pt was asleep, asymptomatic, neuro status remains unchanged- wakes up & FC. HR then back down to 70s NSR. Notified Dr Rosendo Gros, orders received for BMP, phos, mag. Will continue to monitor.  Maeven Mcdougall, [name] Marie 04/23/2015 11:28 PM

## 2015-04-23 NOTE — Progress Notes (Signed)
Patient ID: [name], male   DOB: [birth year], 50 y.o.   MRN: [id number] Noted Dr. Harley Hallmark F/U and clearance to proceed with trach/PEG. I spoke with Jermone's nephew at the bedside and his daughter [name] on speaker phone. I explained the procedures for trach/PEG as well as the risks and benefits.  They are agreeable and his daughter expressed familiarity with the procedures. Will arrange for tomorrow. Georganna Skeans, MD, MPH, FACS Trauma: [phone] General Surgery: [phone]
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
## 2015-04-23 NOTE — Progress Notes (Signed)
Pharmacy Antibiotic Follow-up Note  [name] is a 50 y.o. year-old male admitted on 04/05/2015.  The patient is currently on day 3 of abx for sepsis.  Assessment: S/p abx for MRSA PNA. Restarted abx for sepsis on 1/11. Day #5 of abx for sepsis. Vanc trough Goal 15-20. SCr stable, CrCl > 128ml/min. UOP okay.   Plan: Continue vancomycin 1,250mg  IV Q8 Plan for trough weekly (next level ~1/20) or if changes in renal function Monitor clinical picture, renal function, VT prn F/U C&S, abx deescalation / LOT   Temp (24hrs), Avg:99.4 F (37.4 C), Min:98.8 F (37.1 C), Max:100 F (37.8 C)   Recent Labs Lab 04/19/15 0310 04/20/15 0600 04/21/15 0520 04/22/15 0545 04/23/15 0508  WBC 15.5* 18.2* 20.7* 16.4* 16.3*     Recent Labs Lab 04/19/15 0310 04/20/15 0600 04/21/15 0520 04/22/15 0545 04/23/15 0508  CREATININE 0.76 0.62 0.53* 0.62 0.56*   Estimated Creatinine Clearance: 121.3 mL/min (by C-G formula based on Cr of 0.56).    Allergies  Allergen Reactions  . Other Hives    From work uniform  . Shellfish Allergy Swelling    Lip swelling    Antimicrobials this admission: Zosyn 12/29>>1/4;  1/11 >>1/15 Vanc 1/1>>1/8;  1/11 >> Cefepime 1/6>>1/8  Levels/dose changes this admission: 1/3 VT: 10 on 1g q8h 1/4 VT: 17 on 1250 q8h 1/13 VT: 16 on 1250mg  Q8  Microbiology results: 1/1 resp cx >> MRSA (S-vanc) MRSA pcr neg 1/6 urine 1/6 bld x2 > 1/2 CONS 1/11 Blood cx > 1/2 GPC in clusters 1/11 Urine cx > ngF 1/11 Resp cx > pending  Thank you for allowing pharmacy to be a part of this patient's care. Jens Som, PharmD   04/23/2015 10:50 AM

## 2015-04-23 NOTE — Progress Notes (Signed)
Pt on wean, RT & RN at bedside. Pt stated he was not getting enough air and then HR decreased to mid 40s and sats decreased to 90. Pt immediately given 100% 02, suctioned, placed back on full support. Sats and HR increased back to HR 60s-70 and sats mid to high 90s. Will continue to monitor.   Update: MD made aware of event during rounds.

## 2015-04-23 NOTE — Progress Notes (Signed)
Consent obtained from daughter, [name], via telephone for tracheostomy & PEG placement tomorrow 04/24/15. 2nd RN [name] as witness to consent.   [name]

## 2015-04-24 ENCOUNTER — Inpatient Hospital Stay (HOSPITAL_COMMUNITY): Payer: BLUE CROSS/BLUE SHIELD | Admitting: Certified Registered Nurse Anesthetist

## 2015-04-24 ENCOUNTER — Encounter (HOSPITAL_COMMUNITY)
Admission: EM | Disposition: A | Discharge status: Nursing Home (Includes ICF) | Payer: Self-pay | Source: Home / Self Care

## 2015-04-24 ENCOUNTER — Inpatient Hospital Stay (HOSPITAL_COMMUNITY): Payer: BLUE CROSS/BLUE SHIELD

## 2015-04-24 HISTORY — PX: PEG PLACEMENT: SHX5437

## 2015-04-24 HISTORY — PX: TRACHEOSTOMY TUBE PLACEMENT: SHX814

## 2015-04-24 LAB — BASIC METABOLIC PANEL
Anion gap: 8 (ref 5–15)
Anion gap: 9 (ref 5–15)
BUN: 21 mg/dL — AB (ref 6–20)
BUN: 24 mg/dL — AB (ref 6–20)
CHLORIDE: 108 mmol/L (ref 101–111)
CHLORIDE: 108 mmol/L (ref 101–111)
CO2: 26 mmol/L (ref 22–32)
CO2: 26 mmol/L (ref 22–32)
CREATININE: 0.5 mg/dL — AB (ref 0.61–1.24)
CREATININE: 0.49 mg/dL — AB (ref 0.61–1.24)
Calcium: 8.7 mg/dL — ABNORMAL LOW (ref 8.9–10.3)
Calcium: 8.7 mg/dL — ABNORMAL LOW (ref 8.9–10.3)
GFR calc Af Amer: >60 mL/min (ref >60)
GFR calc Af Amer: >60 mL/min (ref >60)
GFR calc non Af Amer: >60 mL/min (ref >60)
GFR calc non Af Amer: >60 mL/min (ref >60)
GLUCOSE: 99 mg/dL (ref 65–99)
Glucose, Bld: 129 mg/dL — ABNORMAL HIGH (ref 65–99)
POTASSIUM: 3.5 mmol/L (ref 3.5–5.1)
Potassium: 3.7 mmol/L (ref 3.5–5.1)
Sodium: 142 mmol/L (ref 135–145)
Sodium: 143 mmol/L (ref 135–145)

## 2015-04-24 LAB — GLUCOSE, CAPILLARY
GLUCOSE-CAPILLARY: 103 mg/dL — AB (ref 65–99)
GLUCOSE-CAPILLARY: 83 mg/dL (ref 65–99)
GLUCOSE-CAPILLARY: 89 mg/dL (ref 65–99)
GLUCOSE-CAPILLARY: 94 mg/dL (ref 65–99)
Glucose-Capillary: 109 mg/dL — ABNORMAL HIGH (ref 65–99)
Glucose-Capillary: 149 mg/dL — ABNORMAL HIGH (ref 65–99)
Glucose-Capillary: 82 mg/dL (ref 65–99)

## 2015-04-24 LAB — PHOSPHORUS: Phosphorus: 2.5 mg/dL (ref 2.5–4.6)

## 2015-04-24 LAB — CBC
HEMATOCRIT: 24.3 % — AB (ref 39.0–52.0)
HEMOGLOBIN: 7.9 g/dL — AB (ref 13.0–17.0)
MCH: 28 pg (ref 26.0–34.0)
MCHC: 32.5 g/dL (ref 30.0–36.0)
MCV: 86.2 fL (ref 78.0–100.0)
Platelets: 535 10*3/uL — ABNORMAL HIGH (ref 150–400)
RBC: 2.82 MIL/uL — ABNORMAL LOW (ref 4.22–5.81)
RDW: 14.8 % (ref 11.5–15.5)
WBC: 15 10*3/uL — ABNORMAL HIGH (ref 4.0–10.5)

## 2015-04-24 LAB — PROTIME-INR
INR: 1.3 (ref 0.00–1.49)
Prothrombin Time: 16.3 seconds — ABNORMAL HIGH (ref 11.6–15.2)

## 2015-04-24 LAB — MAGNESIUM: Magnesium: 2.3 mg/dL (ref 1.7–2.4)

## 2015-04-24 SURGERY — CREATION, TRACHEOSTOMY
Anesthesia: General | Site: Neck

## 2015-04-24 MED ORDER — FENTANYL CITRATE (PF) 100 MCG/2ML IJ SOLN
INTRAMUSCULAR | Status: DC | PRN
  Administered 2015-04-24: 50 ug via INTRAVENOUS

## 2015-04-24 MED ORDER — FENTANYL CITRATE (PF) 250 MCG/5ML IJ SOLN
INTRAMUSCULAR | Status: AC
  Filled 2015-04-24: qty 5

## 2015-04-24 MED ORDER — GLYCOPYRROLATE 0.2 MG/ML IJ SOLN
INTRAMUSCULAR | Status: DC | PRN
  Administered 2015-04-24: 0.2 mg via INTRAVENOUS

## 2015-04-24 MED ORDER — FUROSEMIDE 10 MG/ML IJ SOLN
20.0000 mg | Freq: Once | INTRAMUSCULAR | Status: AC
  Administered 2015-04-24: 20 mg via INTRAVENOUS
  Filled 2015-04-24: qty 2

## 2015-04-24 MED ORDER — MIDAZOLAM HCL 5 MG/5ML IJ SOLN
INTRAMUSCULAR | Status: DC | PRN
  Administered 2015-04-24: 2 mg via INTRAVENOUS

## 2015-04-24 MED ORDER — MIDAZOLAM HCL 2 MG/2ML IJ SOLN
INTRAMUSCULAR | Status: AC
  Filled 2015-04-24: qty 2

## 2015-04-24 MED ORDER — LIDOCAINE-EPINEPHRINE 1.5 %-1:200,000 OPTIME - NO CHARGE
INTRAMUSCULAR | Status: DC | PRN
  Administered 2015-04-24: 3 mL via SUBCUTANEOUS

## 2015-04-24 MED ORDER — DEXTROSE 5 % IV SOLN
10.0000 mg | INTRAVENOUS | Status: DC | PRN
  Administered 2015-04-24: 25 ug/min via INTRAVENOUS

## 2015-04-24 MED ORDER — PHENYLEPHRINE HCL 10 MG/ML IJ SOLN
INTRAMUSCULAR | Status: DC | PRN
  Administered 2015-04-24 (×5): 80 ug via INTRAVENOUS

## 2015-04-24 MED ORDER — EPHEDRINE SULFATE 50 MG/ML IJ SOLN
INTRAMUSCULAR | Status: DC | PRN
  Administered 2015-04-24: 10 mg via INTRAVENOUS

## 2015-04-24 MED ORDER — ROCURONIUM BROMIDE 100 MG/10ML IV SOLN
INTRAVENOUS | Status: DC | PRN
  Administered 2015-04-24: 20 mg via INTRAVENOUS
  Administered 2015-04-24: 30 mg via INTRAVENOUS

## 2015-04-24 MED ORDER — PROPOFOL 10 MG/ML IV BOLUS
INTRAVENOUS | Status: DC | PRN
  Administered 2015-04-24: 20 mg via INTRAVENOUS
  Administered 2015-04-24 (×2): 50 mg via INTRAVENOUS

## 2015-04-24 MED ORDER — ONDANSETRON HCL 4 MG/2ML IJ SOLN
INTRAMUSCULAR | Status: DC | PRN
  Administered 2015-04-24: 4 mg via INTRAVENOUS

## 2015-04-24 MED ORDER — LACTATED RINGERS IV SOLN
INTRAVENOUS | Status: DC | PRN
  Administered 2015-04-24: 10:00:00 via INTRAVENOUS

## 2015-04-24 MED ORDER — ENOXAPARIN SODIUM 40 MG/0.4ML ~~LOC~~ SOLN
40.0000 mg | SUBCUTANEOUS | Status: DC
  Administered 2015-04-24 – 2015-04-29 (×6): 40 mg via SUBCUTANEOUS
  Filled 2015-04-24 (×6): qty 0.4

## 2015-04-24 MED ORDER — 0.9 % SODIUM CHLORIDE (POUR BTL) OPTIME
TOPICAL | Status: DC | PRN
  Administered 2015-04-24: 1000 mL

## 2015-04-24 SURGICAL SUPPLY — 40 items
BLADE SURG ROTATE 9660 (MISCELLANEOUS) IMPLANT
CANISTER SUCTION 2500CC (MISCELLANEOUS) ×4 IMPLANT
CHLORAPREP W/TINT 26ML (MISCELLANEOUS) ×4 IMPLANT
COVER SURGICAL LIGHT HANDLE (MISCELLANEOUS) ×4 IMPLANT
DRAPE LAPAROTOMY T 98X78 PEDS (DRAPES) ×4 IMPLANT
DRAPE UTILITY XL STRL (DRAPES) ×4 IMPLANT
ELECT CAUTERY BLADE 6.4 (BLADE) ×4 IMPLANT
ELECT REM PT RETURN 9FT ADLT (ELECTROSURGICAL) ×4
ELECTRODE REM PT RTRN 9FT ADLT (ELECTROSURGICAL) ×2 IMPLANT
GAUZE SPONGE 4X4 16PLY XRAY LF (GAUZE/BANDAGES/DRESSINGS) ×4 IMPLANT
GLOVE BIO SURGEON STRL SZ 6 (GLOVE) ×8 IMPLANT
GLOVE BIO SURGEON STRL SZ8 (GLOVE) ×8 IMPLANT
GLOVE BIOGEL PI IND STRL 6.5 (GLOVE) ×4 IMPLANT
GLOVE BIOGEL PI IND STRL 7.0 (GLOVE) ×4 IMPLANT
GLOVE BIOGEL PI IND STRL 8 (GLOVE) ×2 IMPLANT
GLOVE BIOGEL PI IND STRL 8.5 (GLOVE) ×2 IMPLANT
GLOVE BIOGEL PI INDICATOR 6.5 (GLOVE) ×4
GLOVE BIOGEL PI INDICATOR 7.0 (GLOVE) ×4
GLOVE BIOGEL PI INDICATOR 8 (GLOVE) ×2
GLOVE BIOGEL PI INDICATOR 8.5 (GLOVE) ×2
GLOVE ECLIPSE 6.5 STRL STRAW (GLOVE) ×8 IMPLANT
GOWN STRL REUS W/ TWL LRG LVL3 (GOWN DISPOSABLE) ×8 IMPLANT
GOWN STRL REUS W/ TWL XL LVL3 (GOWN DISPOSABLE) ×4 IMPLANT
GOWN STRL REUS W/TWL LRG LVL3 (GOWN DISPOSABLE) ×8
GOWN STRL REUS W/TWL XL LVL3 (GOWN DISPOSABLE) ×4
INTRODUCER TRACH BLUE RHINO 6F (TUBING) ×4 IMPLANT
INTRODUCER TRACH BLUE RHINO 8F (TUBING) IMPLANT
KIT BASIN OR (CUSTOM PROCEDURE TRAY) ×4 IMPLANT
KIT ROOM TURNOVER OR (KITS) ×4 IMPLANT
NS IRRIG 1000ML POUR BTL (IV SOLUTION) ×4 IMPLANT
PACK EENT II TURBAN DRAPE (CUSTOM PROCEDURE TRAY) ×4 IMPLANT
PAD ARMBOARD 7.5X6 YLW CONV (MISCELLANEOUS) ×8 IMPLANT
PENCIL BUTTON HOLSTER BLD 10FT (ELECTRODE) ×4 IMPLANT
SPONGE INTESTINAL PEANUT (DISPOSABLE) ×4 IMPLANT
SUT VICRYL AB 3 0 TIES (SUTURE) ×4 IMPLANT
SYR 20CC LL (SYRINGE) ×4 IMPLANT
TOWEL OR 17X24 6PK STRL BLUE (TOWEL DISPOSABLE) IMPLANT
TOWEL OR 17X26 10 PK STRL BLUE (TOWEL DISPOSABLE) ×4 IMPLANT
TUBE CONNECTING 12'X1/4 (SUCTIONS) ×2
TUBE CONNECTING 12X1/4 (SUCTIONS) ×6 IMPLANT

## 2015-04-24 NOTE — Op Note (Signed)
04/05/2015 - 04/24/2015  10:58 AM  PATIENT:  [name]  50 y.o. male  PRE-OPERATIVE DIAGNOSIS:  RESPRIATORY FAILURE, CERVICAL SPINE FRACTURE  POST-OPERATIVE DIAGNOSIS:  RESPRIATORY FAILURE, CERVICAL SPINE FRACTURE  PROCEDURE:  Procedure(s): TRACHEOSTOMY #6 SHILEY PERCUTANEOUS ENDOSCOPIC GASTROSTOMY (PEG) PLACEMENT  SURGEON:  Surgeon(s): Georganna Skeans, MD  ASSISTANTS: Kasandra Knudsen, PAS, Jomarie Longs, [facility]   ANESTHESIA:   local and general  EBL:  Total I/O In: 125 [I.V.:125] Out: 275 [Urine:275]  BLOOD ADMINISTERED:none  DRAINS: Gastrostomy Tube   SPECIMEN:  No Specimen  DISPOSITION OF SPECIMEN:  N/A  COUNTS:  YES  DICTATION: .Dragon Dictation [name] was brought directly from the neuro trauma ICU to the OR on the ventilator for planned tracheostomy and PEG tube placement. Informed consent was obtained from his daughter. He is on IV antibiotic protocol. General anesthesia was administered by the anesthesia staff. Attention was first directed to the tracheostomy. His anterior cervical collar was removed and his head was taped in neutral position. Dressings were taken down off his neck. Anterior neck and upper chest were prepped and draped in sterile fashion. Time out procedure was done. Local was injected over the trachea. Vertical incision was made above the sternal notch. Subcutis tissues were dissected down revealing the platysma which was divided. Strap muscles were identified and divided along the midline. Thyroid isthmus was divided with cautery achieving excellent hemostasis. This exposed the anterior trachea. Endotracheal tube was withdrawn under direct palpation. Angiocath was placed between the second and third tracheal ring. Guidewire was placed followed by small blue dilator. Trach hook was held in place. Large blue line no dilator was placed over the wire. Next #6 Shiley tracheostomy was placed over a 24 [place] dilator. The inner cannula was placed. The cuff  was inflated. Tracheostomy tube was hooked up to the ventilator circuit and excellent volume returns an end-tidal CO2 were obtained. Dressing sponge was placed in the trach was sutured to the skin with 2-0 Prolene. Velcro trach tie was applied.  Attention was directed to the PEG tube placement. A new timeout procedure was performed. Esophagogastroduodenoscope was inserted via the mouth, into the esophagus. There was good bit of edema in the neck after his ACDF surgery. I was able to follow the orogastric tube down into the esophagus which appeared normal. The stomach was entered. We found an excellent poke site. Abdomen was prepped and draped in sterile fashion. Local was injected in a small incision was made. Angiocath was inserted under direct vision. Guidewire was inserted into the stomach and this was grasped with the endoscopic snare. While was brought out through the mouth. PEG tube was attached to the wire. PEG tube was then brought back out through the abdominal wall in standard fashion. We removed the orogastric tube. Despite this, and multiple attempts, I was not able to reintubate the esophagus due to the edema in the neck. The PEG tube was adjusted into appropriate position and the flange was applied with antibiotic ointment.   All counts were correct. He tolerated the procedure well without apparent complication was taken directly back to the trauma neuro ICU on the ventilator in critical but stable condition. PATIENT DISPOSITION:  ICU - intubated and hemodynamically stable.   Delay start of Pharmacological VTE agent (>24hrs) due to surgical blood loss or risk of bleeding:  no  Georganna Skeans, MD, MPH, FACS Pager: [phone]  1/17/201710:58 AM

## 2015-04-24 NOTE — Progress Notes (Signed)
Received call from pt's wife, Maudie Mercury, requesting FMLA paperwork be completed for pt.  She states paperwork was sent 2 weeks ago, and she hasn't heard anything back.  RN Case Manager has not received any paperwork, or been notified of need for FMLA paperwork up until now.   Found FMLA papers in the back of chart; looks like they were faxed on 04/16/15.  Called wife back( phone [phone]); explained that papers were filed in chart unbeknownst to case manager.  Will complete FMLA paperwork in timely fashion and notify wife when done.  Wife appreciative of help.    Reinaldo Raddle, RN, BSN  Trauma/Neuro ICU Case Manager [phone]
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
## 2015-04-24 NOTE — Anesthesia Preprocedure Evaluation (Signed)
Anesthesia Evaluation   Patient awake    Reviewed: Allergy & Precautions, NPO status , Patient's Chart, lab work & pertinent test results, Unable to perform ROS - Chart review only  Airway Mallampati: Intubated       Dental   Pulmonary    Pulmonary exam normal        Cardiovascular Normal cardiovascular exam     Neuro/Psych    GI/Hepatic   Endo/Other    Renal/GU      Musculoskeletal   Abdominal   Peds  Hematology   Anesthesia Other Findings   Reproductive/Obstetrics                             Anesthesia Physical Anesthesia Plan  ASA: III  Anesthesia Plan: General   Post-op Pain Management:    Induction: Intravenous  Airway Management Planned: Oral ETT  Additional Equipment:   Intra-op Plan:   Post-operative Plan: Post-operative intubation/ventilation  Informed Consent: I have reviewed the patients History and Physical, chart, labs and discussed the procedure including the risks, benefits and alternatives for the proposed anesthesia with the patient or authorized representative who has indicated his/her understanding and acceptance.     Plan Discussed with: CRNA and Surgeon  Anesthesia Plan Comments:         Anesthesia Quick Evaluation

## 2015-04-24 NOTE — Progress Notes (Signed)
Pt returned to 3M01 from [place]

## 2015-04-24 NOTE — Transfer of Care (Signed)
Immediate Anesthesia Transfer of Care Note  Patient: [name]  Procedure(s) Performed: Procedure(s): TRACHEOSTOMY (N/A) PERCUTANEOUS ENDOSCOPIC GASTROSTOMY (PEG) PLACEMENT (N/A)  Patient Location: NICU  Anesthesia Type:General  Level of Consciousness: Patient remains intubated per anesthesia plan  Airway & Oxygen Therapy: Patient remains intubated per anesthesia plan  Post-op Assessment: Report given to RN  Post vital signs: Reviewed and stable  Last Vitals:  Filed Vitals:   04/24/15 0800 04/24/15 0900  BP: 98/60 97/58  Pulse: 70 72  Temp: 36.8 C   Resp: 22 26    Complications: No apparent anesthesia complications

## 2015-04-24 NOTE — Anesthesia Postprocedure Evaluation (Signed)
Anesthesia Post Note  Patient: [name]  Procedure(s) Performed: Procedure(s) (LRB): TRACHEOSTOMY (N/A) PERCUTANEOUS ENDOSCOPIC GASTROSTOMY (PEG) PLACEMENT (N/A)  Patient location during evaluation: SICU Anesthesia Type: General Level of consciousness: sedated Pain management: pain level controlled Vital Signs Assessment: post-procedure vital signs reviewed and stable Respiratory status: patient remains intubated per anesthesia plan Cardiovascular status: stable Anesthetic complications: no    Last Vitals:  Filed Vitals:   04/24/15 1600 04/24/15 1602  BP: 95/56 92/48  Pulse: 95 97  Temp:    Resp: 18 18    Last Pain:  Filed Vitals:   04/24/15 1611  PainSc: 6                  [name] DAVID

## 2015-04-24 NOTE — Progress Notes (Signed)
Pt left to go to OR
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
## 2015-04-24 NOTE — Progress Notes (Signed)
[name] called to check on paperwork for the pt's FMLA paperwork to be filled out. I called [name], Case Manager to check on this. She was unaware that the paperwork needed to be done. She came to the unit and checked in the file where in fact there was paperwork waiting to be filled out that apparently had been faxed to the unit on 04/16/2015. [name] did not know the paperwork was here.   [name] now has the paperwork and is contacting [name] to let her know she has it and will work on getting it completed.  [name]

## 2015-04-25 ENCOUNTER — Inpatient Hospital Stay (HOSPITAL_COMMUNITY): Payer: BLUE CROSS/BLUE SHIELD

## 2015-04-25 ENCOUNTER — Encounter (HOSPITAL_COMMUNITY): Payer: Self-pay | Admitting: General Surgery

## 2015-04-25 DIAGNOSIS — R740 Nonspecific elevation of levels of transaminase and lactic acid dehydrogenase [LDH]: Secondary | ICD-10-CM

## 2015-04-25 DIAGNOSIS — G8250 Quadriplegia, unspecified: Secondary | ICD-10-CM

## 2015-04-25 DIAGNOSIS — D72829 Elevated white blood cell count, unspecified: Secondary | ICD-10-CM

## 2015-04-25 DIAGNOSIS — B957 Other staphylococcus as the cause of diseases classified elsewhere: Secondary | ICD-10-CM

## 2015-04-25 DIAGNOSIS — Z930 Tracheostomy status: Secondary | ICD-10-CM

## 2015-04-25 DIAGNOSIS — Z9911 Dependence on respirator [ventilator] status: Secondary | ICD-10-CM

## 2015-04-25 LAB — CBC
HEMATOCRIT: 22.5 % — AB (ref 39.0–52.0)
Hemoglobin: 7.3 g/dL — ABNORMAL LOW (ref 13.0–17.0)
MCH: 28.2 pg (ref 26.0–34.0)
MCHC: 32.4 g/dL (ref 30.0–36.0)
MCV: 86.9 fL (ref 78.0–100.0)
Platelets: 538 10*3/uL — ABNORMAL HIGH (ref 150–400)
RBC: 2.59 MIL/uL — ABNORMAL LOW (ref 4.22–5.81)
RDW: 15.2 % (ref 11.5–15.5)
WBC: 15.1 10*3/uL — ABNORMAL HIGH (ref 4.0–10.5)

## 2015-04-25 LAB — BASIC METABOLIC PANEL
Anion gap: 9 (ref 5–15)
BUN: 21 mg/dL — AB (ref 6–20)
CALCIUM: 8.4 mg/dL — AB (ref 8.9–10.3)
CO2: 26 mmol/L (ref 22–32)
CREATININE: 0.67 mg/dL (ref 0.61–1.24)
Chloride: 109 mmol/L (ref 101–111)
GFR calc Af Amer: >60 mL/min (ref >60)
GFR calc non Af Amer: >60 mL/min (ref >60)
GLUCOSE: 90 mg/dL (ref 65–99)
Potassium: 3.5 mmol/L (ref 3.5–5.1)
Sodium: 144 mmol/L (ref 135–145)

## 2015-04-25 LAB — GLUCOSE, CAPILLARY
GLUCOSE-CAPILLARY: 106 mg/dL — AB (ref 65–99)
GLUCOSE-CAPILLARY: 128 mg/dL — AB (ref 65–99)
Glucose-Capillary: 96 mg/dL (ref 65–99)
Glucose-Capillary: 97 mg/dL (ref 65–99)
Glucose-Capillary: 120 mg/dL — ABNORMAL HIGH (ref 65–99)

## 2015-04-25 MED ORDER — PIVOT 1.5 CAL PO LIQD
1000.0000 mL | ORAL | Status: DC
  Administered 2015-04-25: 1000 mL

## 2015-04-25 MED ORDER — CLONAZEPAM 0.5 MG PO TABS
0.2500 mg | ORAL_TABLET | Freq: Two times a day (BID) | ORAL | Status: DC
  Administered 2015-04-25 – 2015-04-29 (×10): 0.25 mg via ORAL
  Filled 2015-04-25 (×10): qty 1

## 2015-04-25 MED ORDER — PIVOT 1.5 CAL PO LIQD
1000.0000 mL | ORAL | Status: DC
  Administered 2015-04-25 – 2015-05-08 (×17): 1000 mL
  Filled 2015-04-25: qty 1000

## 2015-04-25 MED ORDER — FUROSEMIDE 10 MG/ML IJ SOLN
20.0000 mg | Freq: Once | INTRAMUSCULAR | Status: AC
  Administered 2015-04-25: 20 mg via INTRAVENOUS
  Filled 2015-04-25: qty 2

## 2015-04-25 MED ORDER — POTASSIUM CHLORIDE 20 MEQ/15ML (10%) PO SOLN
40.0000 meq | Freq: Once | ORAL | Status: AC
  Administered 2015-04-25: 40 meq via ORAL
  Filled 2015-04-25: qty 30

## 2015-04-25 MED FILL — Medication: Qty: 1 | Status: AC

## 2015-04-25 NOTE — Progress Notes (Signed)
RN called RT to bedside as she was having difficulty removing the gauze pad that was tucked underneath trach. Pt with copious thick clear/tan/pink tinged mucous under gauze pad and around trach. RT had quite a bit of difficulty removing gauze as it was attached to the stitches. Once gauze was removed, it was noted he had a moderate size open wound at the base of the trach. RN at bedside and aware. RT will continue to monitor
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
## 2015-04-25 NOTE — Progress Notes (Signed)
Rehab Admissions Coordinator Note:  Patient was screened by Retta Diones for appropriateness for an Inpatient Acute Rehab Consult.  Noted PT recommending CIR.  Needs further therapies before we can determine rehab needs.  I will follow progress for now.    Retta Diones 04/25/2015, 1:58 PM  I can be reached at [phone].
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
## 2015-04-25 NOTE — Progress Notes (Addendum)
Placed pt on 8L/40% trach collar for 1st time. Pt began to desat to 87% at 0732 and was placed back on ventilator on PS at 5/5 40%. Will attempt TC again later today. RT will continue to monitor.

## 2015-04-25 NOTE — Progress Notes (Signed)
Pt began to desat on TC, placed pt on PS on vent with improvement. RT will continue to monitor and attempt TC again later
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
## 2015-04-25 NOTE — Progress Notes (Signed)
Patient ID: [name], male   DOB: Oct 27, 1965, 50 y.o.   MRN: [id number] Lurline Idol in place. Neuro unchanged. Will get postop rays in 24-48 hours

## 2015-04-26 ENCOUNTER — Ambulatory Visit (HOSPITAL_COMMUNITY): Payer: BLUE CROSS/BLUE SHIELD

## 2015-04-26 DIAGNOSIS — J9601 Acute respiratory failure with hypoxia: Secondary | ICD-10-CM

## 2015-04-26 LAB — BASIC METABOLIC PANEL
Anion gap: 9 (ref 5–15)
BUN: 22 mg/dL — AB (ref 6–20)
CALCIUM: 8.3 mg/dL — AB (ref 8.9–10.3)
CHLORIDE: 110 mmol/L (ref 101–111)
CO2: 26 mmol/L (ref 22–32)
CREATININE: 0.68 mg/dL (ref 0.61–1.24)
GFR calc Af Amer: >60 mL/min (ref >60)
GFR calc non Af Amer: >60 mL/min (ref >60)
Glucose, Bld: 121 mg/dL — ABNORMAL HIGH (ref 65–99)
Potassium: 3.5 mmol/L (ref 3.5–5.1)
SODIUM: 145 mmol/L (ref 135–145)

## 2015-04-26 LAB — GLUCOSE, CAPILLARY
GLUCOSE-CAPILLARY: 117 mg/dL — AB (ref 65–99)
GLUCOSE-CAPILLARY: 120 mg/dL — AB (ref 65–99)
Glucose-Capillary: 125 mg/dL — ABNORMAL HIGH (ref 65–99)
Glucose-Capillary: 114 mg/dL — ABNORMAL HIGH (ref 65–99)
Glucose-Capillary: 121 mg/dL — ABNORMAL HIGH (ref 65–99)
Glucose-Capillary: 131 mg/dL — ABNORMAL HIGH (ref 65–99)
Glucose-Capillary: 111 mg/dL — ABNORMAL HIGH (ref 65–99)
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
## 2015-04-26 NOTE — Progress Notes (Signed)
Pt placed on 10L/40% Trach Collar. Pt states he is very anxious about it but RT and RN reassured pt we will not have him wear it too long and will not let him get into respiratory distress. Pt states he is willing to try it again for a short time. RN aware of plan and RT will continue to monitor.

## 2015-04-26 NOTE — Progress Notes (Signed)
Echocardiogram 2D Echocardiogram has been performed.  Tresa Res 04/26/2015, 2:33 PM

## 2015-04-26 NOTE — Progress Notes (Signed)
Patient ID: [name], male   DOB: 01/25/1966, 50 y.o.   MRN: [id number] I called his daughter, [name] and updated her. Georganna Skeans, MD, MPH, FACS Trauma: [phone] General Surgery: [phone]
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
## 2015-04-26 NOTE — Progress Notes (Signed)
Patient ID: [name], male   DOB: 02/24/1966, 50 y.o.   MRN: [id number] Wound inspected. No signs of infection but looks like seroma. Cleaned. Can be oob in a chair. Chest brace extension can be off while on bed

## 2015-04-26 NOTE — Progress Notes (Signed)
RT to pt's room to do a trach check. Pt spo2 had dropped to 84% on trach collar after turning to his side while getting a bath. RT getting ready (isolation room) to put pt back on f/s on vent when pt went bradycardic eventually asystole with no spontaneous respirations. Code Blue was called and pt manually ventilated x1 minute. Pt returned to ROSC and was placed on FS on ventilator with no further complications. RT will continue to monitor closely

## 2015-04-26 NOTE — Progress Notes (Signed)
OT Cancellation Note  Patient Details Name: [name] MRN: [id number] DOB: [birth year]   Cancelled Treatment:    Reason Eval/Treat Not Completed: Patient at procedure or test/ unavailable. Attempted to see again, pt on TC trial. Discussed with nsg. Pt needs to wear PRAFO boots during night with  B resting hand splints, then on/off q 2 hours during the day. Discussed need to increase pt into chair position. Nsg aware to have legs ace wrapped when sitting in chair position.   Keys, OTR/L  [id number] 04/26/2015 04/26/2015, 3:10 PM

## 2015-04-26 NOTE — Progress Notes (Signed)
Patient was tolerating tracheostomy collar at 10L, 40% 02. Patient was laid down and cleaned after having a bowel movement. Patient was turned on right side, initially tolerated, then oxygen saturation decreased to 80s and HR decreased to 60s. Pt turned supine but heart rate continued to decrease with oxygen saturation in the 70s. He quickly became asystolic and unresponsive. No pulse was palpated. Chest compressions started on the patient and a code blue was called. Code team arrived to bedside. Within a minute spontaneous circulation was achieved with palpable pulse. Pt initially hypotensive but spontaneously became normotensive. Pt responded and followed commands. MD at bedside. BMP drawn. Daughter was updated by trauma MD. Will continue to monitor.

## 2015-04-26 NOTE — Progress Notes (Signed)
[facility] for Infectious Disease   Reason for visit: Follow up on CoNS blood cultures, MRSA pneumonia  Interval History: blood cultures repeated, TTE ordered.  AFebrile.    Physical Exam: Constitutional:  Filed Vitals:   04/26/15 0900 04/26/15 1000  BP: 103/59 105/61  Pulse: 91 87  Temp:    Resp: 18 23   patient appears in NAD, on vent Respiratory: Normal respiratory effort; CTA B Cardiovascular: RRR GI: soft around chest brace  Review of Systems: Constitutional: negative for chills Gastrointestinal: negative for diarrhea ROS per nodding  Lab Results  Component Value Date   WBC 15.1* 04/25/2015   HGB 7.3* 04/25/2015   HCT 22.5* 04/25/2015   MCV 86.9 04/25/2015   PLT 538* 04/25/2015    Lab Results  Component Value Date   CREATININE 0.67 04/25/2015   BUN 21* 04/25/2015   NA 144 04/25/2015   K 3.5 04/25/2015   CL 109 04/25/2015   CO2 26 04/25/2015    Lab Results  Component Value Date   ALT 182* 04/19/2015   AST 123* 04/19/2015   ALKPHOS 170* 04/19/2015     Microbiology: Recent Results (from the past 240 hour(s))  Culture, Urine     Status: None   Collection Time: 04/18/15  8:00 AM  Result Value Ref Range Status   Specimen Description URINE, CATHETERIZED  Final   Special Requests NONE  Final   Culture NO GROWTH 1 DAY  Final   Report Status 04/19/2015 FINAL  Final  Culture, blood (Routine X 2) w Reflex to ID Panel     Status: None   Collection Time: 04/18/15  9:00 AM  Result Value Ref Range Status   Specimen Description BLOOD LEFT ARM  Final   Special Requests IN PEDIATRIC BOTTLE 2CC  Final   Culture  Setup Time   Final    GRAM POSITIVE COCCI IN CLUSTERS IN PEDIATRIC BOTTLE CRITICAL RESULT CALLED TO, READ BACK BY AND VERIFIED WITH: [name] @0659  04/19/15 [name]    Culture STAPHYLOCOCCUS SPECIES (COAGULASE NEGATIVE)  Final   Report Status 04/21/2015 FINAL  Final   Organism ID, Bacteria STAPHYLOCOCCUS SPECIES (COAGULASE NEGATIVE)  Final      Susceptibility   Staphylococcus species (coagulase negative) - MIC*    CIPROFLOXACIN >=8 RESISTANT Resistant     ERYTHROMYCIN >=8 RESISTANT Resistant     GENTAMICIN 8 INTERMEDIATE Intermediate     OXACILLIN >=4 RESISTANT Resistant     TETRACYCLINE 2 SENSITIVE Sensitive     VANCOMYCIN 1 SENSITIVE Sensitive     TRIMETH/SULFA 160 RESISTANT Resistant     CLINDAMYCIN >=8 RESISTANT Resistant     RIFAMPIN <=0.5 SENSITIVE Sensitive     Inducible Clindamycin NEGATIVE Sensitive     * STAPHYLOCOCCUS SPECIES (COAGULASE NEGATIVE)  Culture, blood (Routine X 2) w Reflex to ID Panel     Status: None   Collection Time: 04/18/15  9:07 AM  Result Value Ref Range Status   Specimen Description BLOOD LEFT ARM  Final   Special Requests IN PEDIATRIC BOTTLE 4CC  Final   Culture NO GROWTH 5 DAYS  Final   Report Status 04/23/2015 FINAL  Final  Culture, respiratory (NON-Expectorated)     Status: None   Collection Time: 04/18/15  7:39 PM  Result Value Ref Range Status   Specimen Description TRACHEAL ASPIRATE  Final   Special Requests Normal  Final   Gram Stain   Final    FEW WBC PRESENT,BOTH PMN AND MONONUCLEAR RARE SQUAMOUS EPITHELIAL  CELLS PRESENT ABUNDANT GRAM POSITIVE COCCI IN PAIRS Performed at [facility]    Culture   Final    ABUNDANT METHICILLIN RESISTANT STAPHYLOCOCCUS AUREUS Note: RIFAMPIN AND GENTAMICIN SHOULD NOT BE USED AS SINGLE DRUGS FOR TREATMENT OF STAPH INFECTIONS. This organism DOES NOT demonstrate inducible Clindamycin resistance in vitro. Performed at [facility]    Report Status 04/21/2015 FINAL  Final   Organism ID, Bacteria METHICILLIN RESISTANT STAPHYLOCOCCUS AUREUS  Final      Susceptibility   Methicillin resistant staphylococcus aureus - MIC*    CLINDAMYCIN <=0.25 SENSITIVE Sensitive     ERYTHROMYCIN >=8 RESISTANT Resistant     GENTAMICIN <=0.5 SENSITIVE Sensitive     LEVOFLOXACIN >=8 RESISTANT Resistant     OXACILLIN >=4 RESISTANT Resistant      RIFAMPIN <=0.5 SENSITIVE Sensitive     TRIMETH/SULFA <=10 SENSITIVE Sensitive     VANCOMYCIN 1 SENSITIVE Sensitive     TETRACYCLINE <=1 SENSITIVE Sensitive     * ABUNDANT METHICILLIN RESISTANT STAPHYLOCOCCUS AUREUS  Culture, blood (routine x 2)     Status: None (Preliminary result)   Collection Time: 04/25/15  5:26 PM  Result Value Ref Range Status   Specimen Description BLOOD LEFT ARM  Final   Special Requests BOTTLES DRAWN AEROBIC AND ANAEROBIC 5CC  Final   Culture PENDING  Incomplete   Report Status PENDING  Incomplete  Culture, blood (routine x 2)     Status: None (Preliminary result)   Collection Time: 04/25/15  5:32 PM  Result Value Ref Range Status   Specimen Description BLOOD LEFT FOREARM  Final   Special Requests BOTTLES DRAWN AEROBIC ONLY 2CC  Final   Culture PENDING  Incomplete   Report Status PENDING  Incomplete    Impression/Plan:  1. CoNS blood cultures - repeated.  TTE ordered. 2. MRSA pneumonia - on vancomycin.  Will follow WBC, continue vancomycin until resolved.  May not need longer than 10 days.

## 2015-04-26 NOTE — Progress Notes (Signed)
Pharmacy Antibiotic Follow-up Note  [name] is a 50 y.o. year-old male admitted on 04/05/2015.  The patient is currently on day 9 of abx for sepsis.  Assessment: S/p abx for MRSA PNA. Restarted abx for sepsis on 1/11. Day #9/10 of abx for sepsis. Vanc trough Goal 15-20. SCr stable, CrCl > 11ml/min. UOP okay. ID consulted 1/18 for 1/2 BC CoNS 2 different times - could both be a contaminant but r/o endocarditis. Repeat BC drawn 1/18 and echo ordered. WBC 15.1 - remains elevated but stable.  Afebrile.   Plan: F/u repeat BC and echo to r/o endocarditis Continue vancomycin 1,250mg  IV Q8 Per Dr. Grandville Silos 10 days planned, should end 1/20 Plan for trough weekly (next level ~1/20) or if changes in renal function Monitor clinical picture, renal function,    Temp (24hrs), Avg:98.4 F (36.9 C), Min:97.7 F (36.5 C), Max:99.3 F (37.4 C)   Recent Labs Lab 04/21/15 0520 04/22/15 0545 04/23/15 0508 04/24/15 0520 04/25/15 0550  WBC 20.7* 16.4* 16.3* 15.0* 15.1*     Recent Labs Lab 04/22/15 0545 04/23/15 0508 04/23/15 2330 04/24/15 0520 04/25/15 0550  CREATININE 0.62 0.56* 0.49* 0.50* 0.67   Estimated Creatinine Clearance: 117 mL/min (by C-G formula based on Cr of 0.67).    Allergies  Allergen Reactions  . Other Hives    From work uniform  . Shellfish Allergy Swelling    Lip swelling    Antimicrobials this admission: Zosyn 12/29>>1/4;  1/11 >>1/15 Vanc 1/1>>1/8;  1/11 >> Cefepime 1/6>>1/8  Levels/dose changes this admission: 1/3 VT: 10 on 1g q8h 1/4 VT: 17 on 1250 q8h 1/13 VT: 16 on 1250mg  Q8  Microbiology results:  1/1 resp cx >> MRSA (S-vanc MIC 1) MRSA pcr neg 1/6 urine > ngf 1/6 bld x2 > 1/2 CONS 1/11 Blood cx > 1/2 CoNS sens to vanc 1/11 Urine cx > ngF 1/11 Resp cx > abundant MRSA (S-vanc) 1/18 BCx2>> ip  Thank you for allowing pharmacy to be a part of this patient's care. Eudelia Bunch, Pharm.D. [id number] 04/26/2015 8:16 AM

## 2015-04-26 NOTE — Progress Notes (Signed)
OT Cancellation Note  Patient Details Name: [name] MRN: [id number] DOB: October 08, 1965   Cancelled Treatment:    Reason Eval/Treat Not Completed: Other (comment) Attempted to see pt. Ultrasound tech arrived. Will return later.  Drain, OTR/L  [id number] 04/26/2015 04/26/2015, 9:07 AM

## 2015-04-26 NOTE — Progress Notes (Signed)
Patient ID: [name], male   DOB: 06/05/1965, 50 y.o.   MRN: [id number] I met with his oldest sister and updated her on his status and plan of care. Georganna Skeans, MD, MPH, FACS Trauma: [phone] General Surgery: [phone]

## 2015-04-26 NOTE — Care Management (Signed)
Consult for LTAC . Called  patient's daughter [name] left message .  Magdalen Spatz RN BSN [phone]

## 2015-04-27 ENCOUNTER — Inpatient Hospital Stay (HOSPITAL_COMMUNITY): Payer: BLUE CROSS/BLUE SHIELD

## 2015-04-27 ENCOUNTER — Encounter (HOSPITAL_COMMUNITY): Payer: Self-pay | Admitting: *Deleted

## 2015-04-27 LAB — GLUCOSE, CAPILLARY
GLUCOSE-CAPILLARY: 125 mg/dL — AB (ref 65–99)
GLUCOSE-CAPILLARY: 123 mg/dL — AB (ref 65–99)
GLUCOSE-CAPILLARY: 125 mg/dL — AB (ref 65–99)
GLUCOSE-CAPILLARY: 123 mg/dL — AB (ref 65–99)
GLUCOSE-CAPILLARY: 121 mg/dL — AB (ref 65–99)

## 2015-04-27 LAB — BASIC METABOLIC PANEL
Anion gap: 7 (ref 5–15)
BUN: 19 mg/dL (ref 6–20)
CHLORIDE: 109 mmol/L (ref 101–111)
CO2: 28 mmol/L (ref 22–32)
CREATININE: 0.63 mg/dL (ref 0.61–1.24)
Calcium: 8.4 mg/dL — ABNORMAL LOW (ref 8.9–10.3)
GFR calc Af Amer: >60 mL/min (ref >60)
GFR calc non Af Amer: >60 mL/min (ref >60)
Glucose, Bld: 123 mg/dL — ABNORMAL HIGH (ref 65–99)
POTASSIUM: 3.7 mmol/L (ref 3.5–5.1)
Sodium: 144 mmol/L (ref 135–145)

## 2015-04-27 LAB — URINALYSIS, ROUTINE W REFLEX MICROSCOPIC
BILIRUBIN URINE: NEGATIVE
Glucose, UA: NEGATIVE mg/dL
Ketones, ur: NEGATIVE mg/dL
Leukocytes, UA: NEGATIVE
NITRITE: NEGATIVE
PROTEIN: 30 mg/dL — AB
SPECIFIC GRAVITY, URINE: 1.019 (ref 1.005–1.030)
pH: 6 (ref 5.0–8.0)

## 2015-04-27 LAB — URINE MICROSCOPIC-ADD ON

## 2015-04-27 LAB — PREPARE RBC (CROSSMATCH)

## 2015-04-27 LAB — CBC
HEMATOCRIT: 20.9 % — AB (ref 39.0–52.0)
HEMOGLOBIN: 6.8 g/dL — AB (ref 13.0–17.0)
MCH: 28 pg (ref 26.0–34.0)
MCHC: 32.5 g/dL (ref 30.0–36.0)
MCV: 86 fL (ref 78.0–100.0)
Platelets: 520 10*3/uL — ABNORMAL HIGH (ref 150–400)
RBC: 2.43 MIL/uL — ABNORMAL LOW (ref 4.22–5.81)
RDW: 15.2 % (ref 11.5–15.5)
WBC: 17.4 10*3/uL — ABNORMAL HIGH (ref 4.0–10.5)

## 2015-04-27 LAB — VANCOMYCIN, TROUGH: Vancomycin Tr: 20 ug/mL (ref 10.0–20.0)

## 2015-04-27 MED ORDER — SODIUM CHLORIDE 0.9 % IV SOLN: Freq: Once | INTRAVENOUS | Status: DC

## 2015-04-27 NOTE — Progress Notes (Signed)
Faxed pt's completed FMLA and Short Term Disability paperwork to [employer]. Rosann Auerbach at [employer] employer, [place].  (Fax # [phone]).  Originals placed in pt's chart.  Will notify family, should they want to pick up original documents.    Reinaldo Raddle, RN, BSN  Trauma/Neuro ICU Case Manager [phone]

## 2015-04-27 NOTE — Progress Notes (Signed)
CRITICAL VALUE ALERT  Critical value received: Hgb 6.8  Date of notification:  04/27/15  Time of notification:  6:00 AM   Critical value read back:Yes.    Nurse who received alert:  Wallie Renshaw, RN  MD notified (1st page):  Grandville Silos  Time of first page:  6:00 AM   MD notified (2nd page):  Time of second page:  Responding MD:  Grandville Silos  Time MD responded:  6:01 AM

## 2015-04-27 NOTE — Progress Notes (Signed)
Pharmacy Antibiotic Follow-up Note  [name] is a 50 y.o. year-old male admitted on 04/05/2015.  The patient is currently on day 9 of abx for sepsis.  Assessment: S/p abx for MRSA PNA. Restarted abx for sepsis on 1/11. Day #10 of abx for sepsis. Vanc trough therapeutic at 20 mcg/ml today.  Goal 15-20. SCr stable, CrCl > 1108ml/min. UOP okay. ID consulted 1/18 for 1/2 BC CoNS 2 different times - could both be a contaminant. Repeat BC drawn 1/18 no growth so far and echo done 1/18 neg for veg.  . WBC 17.4 - remains elevated.  Febrile to 103 last night, then 101.2 and 100.9. CXR 1/20: worsening B airspace dx, likely worsening edema/CHF.  ID consult: consider cefepime if worse CXR; vanc for 4- 5 more days.   Plan: Consider cefepime  Continue vancomycin 1,250mg  IV Q8 Plan for trough weekly (next level ~1/27) or if changes in renal function Monitor clinical picture, renal function,    Temp (24hrs), Avg:100.8 F (38.2 C), Min:98.9 F (37.2 C), Max:103 F (39.4 C)   Recent Labs Lab 04/22/15 0545 04/23/15 0508 04/24/15 0520 04/25/15 0550 04/27/15 0520  WBC 16.4* 16.3* 15.0* 15.1* 17.4*     Recent Labs Lab 04/23/15 2330 04/24/15 0520 04/25/15 0550 04/26/15 1634 04/27/15 0520  CREATININE 0.49* 0.50* 0.67 0.68 0.63   Estimated Creatinine Clearance: 121.3 mL/min (by C-G formula based on Cr of 0.63).    Allergies  Allergen Reactions  . Other Hives    From work uniform  . Shellfish Allergy Swelling    Lip swelling    Antimicrobials this admission: Zosyn 12/29>>1/4;  1/11 >>1/15 Vanc 1/1>>1/8;  1/11 >> Cefepime 1/6>>1/8  Levels/dose changes this admission: 1/3 VT: 10 on 1g q8h 1/4 VT: 17 on 1250 q8h 1/13 VT: 16 on 1250mg  Q8 1/20 VT: 20 on 1250 q8  Microbiology results:  1/1 resp cx >> MRSA (S-vanc MIC 1) MRSA pcr neg 1/6 urine > ngf 1/6 bld x2 > 1/2 CONS 1/11 Blood cx > 1/2 CoNS sens to vanc 1/11 Urine cx > ngF 1/11 Resp cx > abundant MRSA (S-vanc) 1/18  BCx2>> ngtd  Thank you for allowing pharmacy to be a part of this patient's care. Eudelia Bunch, Pharm.D. [phone] 04/27/2015 1:25 PM

## 2015-04-27 NOTE — Progress Notes (Signed)
[name] for Infectious Disease   Reason for visit: Follow up on CoNS blood cultures, MRSA pneumonia  Interval History: blood cultures repeated, TTE negative for vegetation.  Febrile to 103 overnight.  No diarrhea.   Some increased secretions.  Non verbal.   Physical Exam: Constitutional:  Filed Vitals:   04/27/15 0734 04/27/15 0800  BP:  115/69  Pulse:  81  Temp: 103 F (39.4 C)   Resp:  21   patient appears in NAD, on vent Respiratory: Normal respiratory effort; CTA B Cardiovascular: RRR GI: soft around chest brace MS: no edema Skin: no rashes  Review of Systems: Constitutional: negative for chills Gastrointestinal: negative for diarrhea ROS per nodding  Lab Results  Component Value Date   WBC 17.4* 04/27/2015   HGB 6.8* 04/27/2015   HCT 20.9* 04/27/2015   MCV 86.0 04/27/2015   PLT 520* 04/27/2015    Lab Results  Component Value Date   CREATININE 0.63 04/27/2015   BUN 19 04/27/2015   NA 144 04/27/2015   K 3.7 04/27/2015   CL 109 04/27/2015   CO2 28 04/27/2015    Lab Results  Component Value Date   ALT 182* 04/19/2015   AST 123* 04/19/2015   ALKPHOS 170* 04/19/2015     Microbiology: Recent Results (from the past 240 hour(s))  Culture, Urine     Status: None   Collection Time: 04/18/15  8:00 AM  Result Value Ref Range Status   Specimen Description URINE, CATHETERIZED  Final   Special Requests NONE  Final   Culture NO GROWTH 1 DAY  Final   Report Status 04/19/2015 FINAL  Final  Culture, blood (Routine X 2) w Reflex to ID Panel     Status: None   Collection Time: 04/18/15  9:00 AM  Result Value Ref Range Status   Specimen Description BLOOD LEFT ARM  Final   Special Requests IN PEDIATRIC BOTTLE 2CC  Final   Culture  Setup Time   Final    GRAM POSITIVE COCCI IN CLUSTERS IN PEDIATRIC BOTTLE CRITICAL RESULT CALLED TO, READ BACK BY AND VERIFIED WITH: [name] @0659  04/19/15 [name]    Culture STAPHYLOCOCCUS SPECIES (COAGULASE NEGATIVE)   Final   Report Status 04/21/2015 FINAL  Final   Organism ID, Bacteria STAPHYLOCOCCUS SPECIES (COAGULASE NEGATIVE)  Final      Susceptibility   Staphylococcus species (coagulase negative) - MIC*    CIPROFLOXACIN >=8 RESISTANT Resistant     ERYTHROMYCIN >=8 RESISTANT Resistant     GENTAMICIN 8 INTERMEDIATE Intermediate     OXACILLIN >=4 RESISTANT Resistant     TETRACYCLINE 2 SENSITIVE Sensitive     VANCOMYCIN 1 SENSITIVE Sensitive     TRIMETH/SULFA 160 RESISTANT Resistant     CLINDAMYCIN >=8 RESISTANT Resistant     RIFAMPIN <=0.5 SENSITIVE Sensitive     Inducible Clindamycin NEGATIVE Sensitive     * STAPHYLOCOCCUS SPECIES (COAGULASE NEGATIVE)  Culture, blood (Routine X 2) w Reflex to ID Panel     Status: None   Collection Time: 04/18/15  9:07 AM  Result Value Ref Range Status   Specimen Description BLOOD LEFT ARM  Final   Special Requests IN PEDIATRIC BOTTLE 4CC  Final   Culture NO GROWTH 5 DAYS  Final   Report Status 04/23/2015 FINAL  Final  Culture, respiratory (NON-Expectorated)     Status: None   Collection Time: 04/18/15  7:39 PM  Result Value Ref Range Status   Specimen Description TRACHEAL ASPIRATE  Final  Special Requests Normal  Final   Gram Stain   Final    FEW WBC PRESENT,BOTH PMN AND MONONUCLEAR RARE SQUAMOUS EPITHELIAL CELLS PRESENT ABUNDANT GRAM POSITIVE COCCI IN PAIRS Performed at [facility]    Culture   Final    ABUNDANT METHICILLIN RESISTANT STAPHYLOCOCCUS AUREUS Note: RIFAMPIN AND GENTAMICIN SHOULD NOT BE USED AS SINGLE DRUGS FOR TREATMENT OF STAPH INFECTIONS. This organism DOES NOT demonstrate inducible Clindamycin resistance in vitro. Performed at [facility]    Report Status 04/21/2015 FINAL  Final   Organism ID, Bacteria METHICILLIN RESISTANT STAPHYLOCOCCUS AUREUS  Final      Susceptibility   Methicillin resistant staphylococcus aureus - MIC*    CLINDAMYCIN <=0.25 SENSITIVE Sensitive     ERYTHROMYCIN >=8 RESISTANT Resistant      GENTAMICIN <=0.5 SENSITIVE Sensitive     LEVOFLOXACIN >=8 RESISTANT Resistant     OXACILLIN >=4 RESISTANT Resistant     RIFAMPIN <=0.5 SENSITIVE Sensitive     TRIMETH/SULFA <=10 SENSITIVE Sensitive     VANCOMYCIN 1 SENSITIVE Sensitive     TETRACYCLINE <=1 SENSITIVE Sensitive     * ABUNDANT METHICILLIN RESISTANT STAPHYLOCOCCUS AUREUS  Culture, blood (routine x 2)     Status: None (Preliminary result)   Collection Time: 04/25/15  5:26 PM  Result Value Ref Range Status   Specimen Description BLOOD LEFT ARM  Final   Special Requests BOTTLES DRAWN AEROBIC AND ANAEROBIC 5CC  Final   Culture NO GROWTH < 24 HOURS  Final   Report Status PENDING  Incomplete  Culture, blood (routine x 2)     Status: None (Preliminary result)   Collection Time: 04/25/15  5:32 PM  Result Value Ref Range Status   Specimen Description BLOOD LEFT FOREARM  Final   Special Requests BOTTLES DRAWN AEROBIC ONLY 2CC  Final   Culture NO GROWTH < 24 HOURS  Final   Report Status PENDING  Incomplete    Impression/Plan:  1. CoNS blood cultures - repeated.  TTE negative.  Repeat blood cultures ngtd.   2. MRSA pneumonia - on vancomycin.  Can treat for limited time, 4-5 more days. 3. Fever - unknown new fever, increased WBC.  Will check CXR, UA.  Will consider cefepime if worse CXR.  Has increased secretions.

## 2015-04-27 NOTE — Progress Notes (Signed)
Attempted ATC .40.  Pt did not tolerate.  Inc RR 30's.  SPO2 dec to 90%.  Inc anxiety.  Placed back in CPAP/PS mode on vent.

## 2015-04-27 NOTE — Progress Notes (Signed)
CXR reviewed, edema and no significant opacity.  UA not c/w infection.  Will continue to observe.

## 2015-04-27 NOTE — Clinical Social Work Note (Signed)
CSW continues to follow patient for substance abuse (ETOH) concerns. CSW to assess when appropriate.   Liz Beach MSW, [place], [place], [phone]

## 2015-04-27 NOTE — Progress Notes (Signed)
OT Cancellation Note  Patient Details Name: ZYIEN DIEKEN MRN: [id number] DOB: [birth year]   Cancelled Treatment:    Reason Eval/Treat Not Completed: Medical issues which prohibited therapy. Pt receiving blood (hgb 6.8) and febrile per RN.  Will continue to follow.  Malka So 04/27/2015, 11:15 AM  [phone]

## 2015-04-27 NOTE — Progress Notes (Signed)
Patient ID: [name], male   DOB: [birth year], 50 y.o.   MRN: [id number] Febril, intubated. Will get culture from posterior cervical wound. No redness  Fluid looks like seroma

## 2015-04-27 NOTE — Progress Notes (Signed)
PT Cancellation Note  Patient Details Name: [name] MRN: [id number] DOB: 11/08/1965   Cancelled Treatment:    Reason Eval/Treat Not Completed: Patient not medically ready.  Pt receiving blood and febril.  Spoke with RN and will hold PT and mobility at this time.     Evalyn Shultis, Thornton Papas 04/27/2015, 10:49 AM
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
## 2015-04-27 NOTE — Progress Notes (Signed)
Notified MD Grandville Silos of patients Hgb of 6.8. Order received for 1 unit of PRBC. Will continue to monitor.

## 2015-04-28 DIAGNOSIS — S22019D Unspecified fracture of first thoracic vertebra, subsequent encounter for fracture with routine healing: Secondary | ICD-10-CM

## 2015-04-28 DIAGNOSIS — S12400D Unspecified displaced fracture of fifth cervical vertebra, subsequent encounter for fracture with routine healing: Secondary | ICD-10-CM

## 2015-04-28 DIAGNOSIS — S12600D Unspecified displaced fracture of seventh cervical vertebra, subsequent encounter for fracture with routine healing: Secondary | ICD-10-CM

## 2015-04-28 DIAGNOSIS — S12300D Unspecified displaced fracture of fourth cervical vertebra, subsequent encounter for fracture with routine healing: Secondary | ICD-10-CM

## 2015-04-28 DIAGNOSIS — R509 Fever, unspecified: Secondary | ICD-10-CM

## 2015-04-28 LAB — TYPE AND SCREEN
ABO/RH(D): A POS
ANTIBODY SCREEN: NEGATIVE
UNIT DIVISION: 0

## 2015-04-28 LAB — CBC
HCT: 23 % — ABNORMAL LOW (ref 39.0–52.0)
Hemoglobin: 7.7 g/dL — ABNORMAL LOW (ref 13.0–17.0)
MCH: 28.9 pg (ref 26.0–34.0)
MCHC: 33.5 g/dL (ref 30.0–36.0)
MCV: 86.5 fL (ref 78.0–100.0)
PLATELETS: 472 10*3/uL — AB (ref 150–400)
RBC: 2.66 MIL/uL — ABNORMAL LOW (ref 4.22–5.81)
RDW: 15 % (ref 11.5–15.5)
WBC: 15.3 10*3/uL — AB (ref 4.0–10.5)

## 2015-04-28 LAB — GLUCOSE, CAPILLARY
GLUCOSE-CAPILLARY: 106 mg/dL — AB (ref 65–99)
GLUCOSE-CAPILLARY: 124 mg/dL — AB (ref 65–99)
GLUCOSE-CAPILLARY: 130 mg/dL — AB (ref 65–99)
GLUCOSE-CAPILLARY: 148 mg/dL — AB (ref 65–99)
Glucose-Capillary: 125 mg/dL — ABNORMAL HIGH (ref 65–99)
Glucose-Capillary: 132 mg/dL — ABNORMAL HIGH (ref 65–99)
Glucose-Capillary: 116 mg/dL — ABNORMAL HIGH (ref 65–99)

## 2015-04-28 LAB — BASIC METABOLIC PANEL
Anion gap: 5 (ref 5–15)
BUN: 19 mg/dL (ref 6–20)
CALCIUM: 8.4 mg/dL — AB (ref 8.9–10.3)
CO2: 29 mmol/L (ref 22–32)
CREATININE: 0.56 mg/dL — AB (ref 0.61–1.24)
Chloride: 109 mmol/L (ref 101–111)
Glucose, Bld: 106 mg/dL — ABNORMAL HIGH (ref 65–99)
Potassium: 4.1 mmol/L (ref 3.5–5.1)
SODIUM: 143 mmol/L (ref 135–145)
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
## 2015-04-28 NOTE — Progress Notes (Signed)
Changed vent mode to PSV 10/5 40% patient tolerated well, patient refused ATC at this time stating that he can't breath on that.
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
## 2015-04-28 NOTE — Progress Notes (Signed)
Patient ID: [name], male   DOB: October 27, 1965, 50 y.o.   MRN: [id number] No change in exam. Can flex elbow's and has some mild flexion of the fingers. He is awake and alert and follows commands. His posterior incision is clean and dry without evidence of infection or drainage at this time, he remains in a cervical collar.

## 2015-04-29 DIAGNOSIS — L8990 Pressure ulcer of unspecified site, unspecified stage: Secondary | ICD-10-CM | POA: Insufficient documentation

## 2015-04-29 LAB — CBC
HEMATOCRIT: 23 % — AB (ref 39.0–52.0)
Hemoglobin: 7.5 g/dL — ABNORMAL LOW (ref 13.0–17.0)
MCH: 28.3 pg (ref 26.0–34.0)
MCHC: 32.6 g/dL (ref 30.0–36.0)
MCV: 86.8 fL (ref 78.0–100.0)
PLATELETS: 478 10*3/uL — AB (ref 150–400)
RBC: 2.65 MIL/uL — AB (ref 4.22–5.81)
RDW: 15.4 % (ref 11.5–15.5)
WBC: 15.9 10*3/uL — AB (ref 4.0–10.5)

## 2015-04-29 LAB — BASIC METABOLIC PANEL
ANION GAP: 6 (ref 5–15)
BUN: 18 mg/dL (ref 6–20)
CHLORIDE: 108 mmol/L (ref 101–111)
CO2: 29 mmol/L (ref 22–32)
Calcium: 8.5 mg/dL — ABNORMAL LOW (ref 8.9–10.3)
Creatinine, Ser: 0.57 mg/dL — ABNORMAL LOW (ref 0.61–1.24)
Glucose, Bld: 129 mg/dL — ABNORMAL HIGH (ref 65–99)
POTASSIUM: 4.4 mmol/L (ref 3.5–5.1)
SODIUM: 143 mmol/L (ref 135–145)

## 2015-04-29 LAB — GLUCOSE, CAPILLARY
GLUCOSE-CAPILLARY: 124 mg/dL — AB (ref 65–99)
GLUCOSE-CAPILLARY: 125 mg/dL — AB (ref 65–99)
GLUCOSE-CAPILLARY: 123 mg/dL — AB (ref 65–99)
Glucose-Capillary: 125 mg/dL — ABNORMAL HIGH (ref 65–99)
Glucose-Capillary: 130 mg/dL — ABNORMAL HIGH (ref 65–99)
Glucose-Capillary: 112 mg/dL — ABNORMAL HIGH (ref 65–99)

## 2015-04-29 LAB — HIV ANTIBODY (ROUTINE TESTING W REFLEX): HIV Screen 4th Generation wRfx: NONREACTIVE

## 2015-04-29 LAB — AMYLASE: Amylase: 126 U/L — ABNORMAL HIGH (ref 28–100)

## 2015-04-29 NOTE — Progress Notes (Signed)
Patient ID: [name], male   DOB: [birth year], 50 y.o.   MRN: [id number] Resting quietly, stable, no change
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
## 2015-04-29 NOTE — Progress Notes (Signed)
Orthopedic Tech Progress Note Patient Details:  [name] Jul 18, 1965 [id number] Replacement boot  Ortho Devices Type of Ortho Device: Postop shoe/boot Ortho Device/Splint Location: prafo boot Ortho Device/Splint Interventions: Ordered, Application   Braulio Bosch 04/29/2015, 6:04 PM

## 2015-04-30 ENCOUNTER — Ambulatory Visit (HOSPITAL_COMMUNITY): Payer: BLUE CROSS/BLUE SHIELD

## 2015-04-30 DIAGNOSIS — I824Y2 Acute embolism and thrombosis of unspecified deep veins of left proximal lower extremity: Secondary | ICD-10-CM

## 2015-04-30 DIAGNOSIS — I824Y1 Acute embolism and thrombosis of unspecified deep veins of right proximal lower extremity: Secondary | ICD-10-CM

## 2015-04-30 LAB — GLUCOSE, CAPILLARY
GLUCOSE-CAPILLARY: 138 mg/dL — AB (ref 65–99)
GLUCOSE-CAPILLARY: 112 mg/dL — AB (ref 65–99)
Glucose-Capillary: 111 mg/dL — ABNORMAL HIGH (ref 65–99)
Glucose-Capillary: 105 mg/dL — ABNORMAL HIGH (ref 65–99)
Glucose-Capillary: 106 mg/dL — ABNORMAL HIGH (ref 65–99)

## 2015-04-30 LAB — CULTURE, BLOOD (ROUTINE X 2)
CULTURE: NO GROWTH
CULTURE: NO GROWTH

## 2015-04-30 MED ORDER — ENOXAPARIN SODIUM 80 MG/0.8ML ~~LOC~~ SOLN
75.0000 mg | Freq: Two times a day (BID) | SUBCUTANEOUS | Status: DC
  Administered 2015-04-30 – 2015-05-01 (×2): 75 mg via SUBCUTANEOUS
  Filled 2015-04-30 (×4): qty 0.8

## 2015-04-30 MED ORDER — CLONAZEPAM 0.5 MG PO TABS
0.5000 mg | ORAL_TABLET | Freq: Two times a day (BID) | ORAL | Status: DC
  Administered 2015-04-30 – 2015-05-02 (×6): 0.5 mg via ORAL
  Filled 2015-04-30 (×6): qty 1
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
## 2015-04-30 NOTE — Progress Notes (Addendum)
Pharmacy Antibiotic Follow-up Note & Anticoagulation Note  [name] is a 50 y.o. year-old male admitted on 04/05/2015.  The patient is currently on day 13 for MRSA pneumonia and MRSE in 1/2 blood cultures. Patient remains febrile, with slightly elevated white count. Patient had TTE on 1/19 which was negative for vegetation. Renal function is holding stable, patient continues to have good UOP.  Assessment/Plan: -Continue vancomycin 1250 mg IV q8h -F/u duration with ID   Temp (24hrs), Avg:99.9 F (37.7 C), Min:98.8 F (37.1 C), Max:100.8 F (38.2 C)   Recent Labs Lab 04/24/15 0520 04/25/15 0550 04/27/15 0520 04/28/15 0355 04/29/15 0446  WBC 15.0* 15.1* 17.4* 15.3* 15.9*    Recent Labs Lab 04/25/15 0550 04/26/15 1634 04/27/15 0520 04/28/15 0355 04/29/15 0446  CREATININE 0.67 0.68 0.63 0.56* 0.57*   Estimated Creatinine Clearance: 119.7 mL/min (by C-G formula based on Cr of 0.57).    Allergies  Allergen Reactions  . Other Hives    From work uniform  . Shellfish Allergy Swelling    Lip swelling    Antimicrobials this admission:  Zosyn 12/29>>1/4; 1/11 >>1/15 Vanc 1/1>>1/8; 1/10 vanc x1 for post-op px; 1/11 >> Cefepime 1/6>>1/8  Levels/dose changes this admission:  1/3 VT: 10 on 1g q8h 1/4 VT: 17 on 1250 q8h 1/13 VT: 16 on 1250mg  Q8 1/20 VT: 20 continue 1250 q8h  Microbiology results:  1/1 resp cx >> MRSA (S-vanc MIC 1) MRSA pcr neg 1/6 urine > ngf 1/6 bld x2 > 1/2 CONS 1/11 Blood cx > 1/2 CoNS sens to vanc 1/11 Urine cx > ngF 1/11 Resp cx > abundant MRSA (S-vanc) 1/18 BCx2>> ngtd   Thank you for allowing pharmacy to be a part of this patient's care.    Hughes Better, PharmD, BCPS Clinical Pharmacist Pager: [phone] 04/30/2015 8:12 AM     Addendum -Upper extremity Dopplers are positive for mobile DVT in R axillary vein, R brachial vein, L brachian vein and (+) superficial thrombus in R basilic vein -Start Lovenox 75 mg [place] q12h   -Monitor s/sx bleeding -F/u transition to oral anticoagulation   Harvel Quale  04/30/2015 11:12 AM
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
## 2015-04-30 NOTE — Progress Notes (Signed)
    [facility] for Infectious Disease   Reason for visit: Follow up on fever  Interval History: repeat blood cultures remain negative, continues to have fever, no source. Venous duplex ordered and amylase just a little elevated.  No change of FiO2 on ventilator.  Lines removed.  PICC pulled 1/21.  Less fever since 8am yesterday.    Physical Exam: Constitutional:  Filed Vitals:   04/30/15 0800 04/30/15 0900  BP: 119/67 110/59  Pulse: 100 97  Temp:    Resp: 29 26  + trach Respiratory: Normal respiratory effort; CTA B Cardiovascular: RRR GI: soft, normoactive bowel sounds MS: no edema  Review of Systems: Unable to be assessed due to mental status  Lab Results  Component Value Date   WBC 15.9* 04/29/2015   HGB 7.5* 04/29/2015   HCT 23.0* 04/29/2015   MCV 86.8 04/29/2015   PLT 478* 04/29/2015    Lab Results  Component Value Date   CREATININE 0.57* 04/29/2015   BUN 18 04/29/2015   NA 143 04/29/2015   K 4.4 04/29/2015   CL 108 04/29/2015   CO2 29 04/29/2015    Lab Results  Component Value Date   ALT 182* 04/19/2015   AST 123* 04/19/2015   ALKPHOS 170* 04/19/2015     Microbiology: Recent Results (from the past 240 hour(s))  Culture, blood (routine x 2)     Status: None (Preliminary result)   Collection Time: 04/25/15  5:26 PM  Result Value Ref Range Status   Specimen Description BLOOD LEFT ARM  Final   Special Requests BOTTLES DRAWN AEROBIC AND ANAEROBIC 5CC  Final   Culture NO GROWTH 4 DAYS  Final   Report Status PENDING  Incomplete  Culture, blood (routine x 2)     Status: None (Preliminary result)   Collection Time: 04/25/15  5:32 PM  Result Value Ref Range Status   Specimen Description BLOOD LEFT FOREARM  Final   Special Requests BOTTLES DRAWN AEROBIC ONLY 2CC  Final   Culture NO GROWTH 4 DAYS  Final   Report Status PENDING  Incomplete    Impression/Plan:  1. Fever - unknown source, not on pressors.  Lines removed. May be decreasing with low grade  fever since yesterday am.  Duplex scan pending.   2. Staph aureus - pneumonia - on vancomycin, repeat cultures with Staph aureus in blood.
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
## 2015-04-30 NOTE — Progress Notes (Signed)
SLP Cancellation Note  Patient Details Name: [name] MRN: [id number] DOB: 01-06-1966   Cancelled treatment:       Reason Eval/Treat Not Completed: Medical issues which prohibited therapy . Contacted RT for coordination of in-line PMSV evaluation. Per RT, patient with desaturations this pm requiring intervention. Will plan to f/u in am 1/24. RT aware and in agreement.   Watkins, CCC-SLP [phone]   [name] 04/30/2015, 3:27 PM

## 2015-04-30 NOTE — Progress Notes (Signed)
Spoke with pt's wife, Lavontae Sather, (phone [phone]), regarding transfer to [facility].  Wife states she would like to look at facilities, speak with the rest of her family, and speak with admission liaisons prior to making a decision.  Notified both representatives from area [place] ([facility] and [place]).  MD states pt medically stable at this point for dc to LTAC.    Reinaldo Raddle, RN, BSN  Trauma/Neuro ICU Case Manager [phone]

## 2015-04-30 NOTE — Progress Notes (Signed)
Notified by wife that family has chosen [facility] for [place].  Insurance authorization to be submitted today.  Will follow with updates.    Reinaldo Raddle, RN, BSN  Trauma/Neuro ICU Case Manager [phone]

## 2015-04-30 NOTE — Progress Notes (Addendum)
*  Preliminary Results* Bilateral upper extremity venous duplex completed. The right upper extremity is positive for mobile deep vein thrombosis involving the right axillary vein. There is also acute deep vein thrombosis noted in the right brachial vein and superficial thrombus in the right basilic vein. The left upper extremity is positive for deep vein thrombosis involving the left brachial vein.  Preliminary results discussed with Romelle Starcher, RN and Dr. Grandville Silos.  04/30/2015 10:30 AM  Maudry Mayhew, RVT, RDCS, RDMS

## 2015-05-01 ENCOUNTER — Inpatient Hospital Stay (HOSPITAL_COMMUNITY): Payer: BLUE CROSS/BLUE SHIELD

## 2015-05-01 DIAGNOSIS — I82409 Acute embolism and thrombosis of unspecified deep veins of unspecified lower extremity: Secondary | ICD-10-CM

## 2015-05-01 LAB — CBC
HEMATOCRIT: 23.2 % — AB (ref 39.0–52.0)
HEMOGLOBIN: 7.4 g/dL — AB (ref 13.0–17.0)
MCH: 27.4 pg (ref 26.0–34.0)
MCHC: 31.9 g/dL (ref 30.0–36.0)
MCV: 85.9 fL (ref 78.0–100.0)
PLATELETS: 524 10*3/uL — AB (ref 150–400)
RBC: 2.7 MIL/uL — AB (ref 4.22–5.81)
RDW: 16.2 % — ABNORMAL HIGH (ref 11.5–15.5)
WBC: 12.6 10*3/uL — AB (ref 4.0–10.5)

## 2015-05-01 LAB — BASIC METABOLIC PANEL
ANION GAP: 11 (ref 5–15)
BUN: 20 mg/dL (ref 6–20)
CALCIUM: 8.6 mg/dL — AB (ref 8.9–10.3)
CO2: 28 mmol/L (ref 22–32)
CREATININE: 0.57 mg/dL — AB (ref 0.61–1.24)
Chloride: 102 mmol/L (ref 101–111)
GLUCOSE: 125 mg/dL — AB (ref 65–99)
Potassium: 4.1 mmol/L (ref 3.5–5.1)
Sodium: 141 mmol/L (ref 135–145)

## 2015-05-01 LAB — GLUCOSE, CAPILLARY
GLUCOSE-CAPILLARY: 117 mg/dL — AB (ref 65–99)
GLUCOSE-CAPILLARY: 104 mg/dL — AB (ref 65–99)
Glucose-Capillary: 109 mg/dL — ABNORMAL HIGH (ref 65–99)
Glucose-Capillary: 110 mg/dL — ABNORMAL HIGH (ref 65–99)
Glucose-Capillary: 110 mg/dL — ABNORMAL HIGH (ref 65–99)
Glucose-Capillary: 113 mg/dL — ABNORMAL HIGH (ref 65–99)
Glucose-Capillary: 122 mg/dL — ABNORMAL HIGH (ref 65–99)

## 2015-05-01 MED ORDER — ENOXAPARIN SODIUM 80 MG/0.8ML ~~LOC~~ SOLN
70.0000 mg | Freq: Two times a day (BID) | SUBCUTANEOUS | Status: DC
  Administered 2015-05-01 – 2015-05-05 (×9): 70 mg via SUBCUTANEOUS
  Filled 2015-05-01 (×10): qty 0.8

## 2015-05-01 NOTE — Progress Notes (Signed)
    [name] for Infectious Disease   Reason for visit: Follow up on fever  Interval History: repeat blood cultures remain negative. Venous duplex noted and DVT in multiple areas, full dose anticoagulation started.  No change of FiO2 on ventilator still, remaining at 40%.  Lines removed.  Fever again to Tmax 102.9.    Physical Exam: Constitutional:  Filed Vitals:   05/01/15 0708 05/01/15 0800  BP:  123/74  Pulse: 86 86  Temp:  100.6 F (38.1 C)  Resp: 28 28  + trach; minimal arousal Respiratory: on vent with trach; no respiratory distress noted Cardiovascular: RRR GI: soft, normoactive bowel sounds MS: no edema  Review of Systems: Unable to be assessed due to mental status  Lab Results  Component Value Date   WBC 12.6* 05/01/2015   HGB 7.4* 05/01/2015   HCT 23.2* 05/01/2015   MCV 85.9 05/01/2015   PLT 524* 05/01/2015    Lab Results  Component Value Date   CREATININE 0.57* 05/01/2015   BUN 20 05/01/2015   NA 141 05/01/2015   K 4.1 05/01/2015   CL 102 05/01/2015   CO2 28 05/01/2015    Lab Results  Component Value Date   ALT 182* 04/19/2015   AST 123* 04/19/2015   ALKPHOS 170* 04/19/2015     Microbiology: Recent Results (from the past 240 hour(s))  Culture, blood (routine x 2)     Status: None   Collection Time: 04/25/15  5:26 PM  Result Value Ref Range Status   Specimen Description BLOOD LEFT ARM  Final   Special Requests BOTTLES DRAWN AEROBIC AND ANAEROBIC 5CC  Final   Culture NO GROWTH 5 DAYS  Final   Report Status 04/30/2015 FINAL  Final  Culture, blood (routine x 2)     Status: None   Collection Time: 04/25/15  5:32 PM  Result Value Ref Range Status   Specimen Description BLOOD LEFT FOREARM  Final   Special Requests BOTTLES DRAWN AEROBIC ONLY [place]  Final   Culture NO GROWTH 5 DAYS  Final   Report Status 04/30/2015 FINAL  Final    Impression/Plan:  1. Fever - again to 102.  Source most likely clots, no obvious infectious source.  Blood  cultures repeated.    2. Staph aureus - pneumonia - on vancomycin, repeat cultures from 1/18 ngtd.  Previous cultures with CoNS.  Will d/c vancomycin tomorrow for completion of pneumonia treatment.    3. dispo - possibly to [facility].   4. DVTs - on full dose anticoagulation

## 2015-05-01 NOTE — Progress Notes (Signed)
SLP Cancellation Note  Patient Details Name: [name] MRN: [id number] DOB: 1966-03-16   Cancelled treatment:       Reason Eval/Treat Not Completed: Medical issues which prohibited therapy. Planned for inline PMSV at 1330 with RT. Pt struggling on vent with fatigue this pm per RT. Plan to see pt in am tomorrow as pt appears to suffer from fatigue in pm.    Zareth Rippetoe, Katherene Ponto 05/01/2015, 1:28 PM

## 2015-05-01 NOTE — Progress Notes (Signed)
Patient ID: [name], male   DOB: April 11, 1965, 50 y.o.   MRN: [id number] I spoke with [name]. She is going to tour [facility] today. She asked me to speak with the pulmonary service at [facility] to make sure they thought proceeding to [facility] was OK and did not think he needed to be transferred to [facility] for further care. I called their transfer center and spoke with Dr. Edman Circle. I reviewed [name]'s case with him in detail. He agreed the next step should be LTACH for ongoing vent wean and there would be no advantage to transferring him at this time. I will F/U with his [name]. Colin Skeans, MD, MPH, FACS Trauma: [phone] General Surgery: [phone]
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
## 2015-05-01 NOTE — Progress Notes (Signed)
Patient ID: DIN BOOKWALTER, male   DOB: 1965-07-18, 50 y.o.   MRN: [id number] I met with his daughter, wife, sister and other family members and updated them. They have decided to persue [facility].  Notified by RN of tiny sacral and heel wounds - will ask WOC RN to eval.  Georganna Skeans, MD, MPH, [place] Trauma: [phone] General Surgery: [phone]
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
## 2015-05-01 NOTE — Consult Note (Signed)
WOC wound consult note Reason for Consult: Consult requested for several pressure injuries.  Pt has been critically ill for several weeks. Wound type:  Sacrum with dark reddish purple blood-filled blister deep tissue injury; .3X.3cm, skin beginning to lift at the edges. No odor or drainage. Left knee with deep tissue pressure injuries; 2X1cm and 2X3cm, dark purple reddish intact skin without odor or drainage. Left anterior foot with deep tissue pressure injuries; .2X.2cm and .3X.3cm, dark purple reddish intact skin without odor or drainage. Left posterior heel and achilles area with the same appearance; 7X3cm Right anterior foot with deep tissue pressure injuries; .2X.2cm and .3X.2cm, dark purple reddish intact skin without odor or drainage. Right posterior heel and achilles area with the same appearance; 9X2cm Dressing procedure/placement/frequency: Lurline Idol site with 2 areas of stage 3 pressure injuries where previous sutures were located next to the face plate, each approx .2X.2X.1cm, yellow and moist. Plan: Aquacel and Tegaderm to provide antimicrobial benefits and absorb drainage to trach site wounds.  Foam dressings to all the deep tissure injuries.  These are high risk to evolve into full thickness tissue loss despite optimal plan of care.  Pt is on a low air loss bed and has heel lift splints to reduce pressure.  No family present to discuss plan of care. Please re-consult if further assistance is needed.  Thank-you,  Julien Girt MSN, [place], [place], [place], [place]

## 2015-05-01 NOTE — Progress Notes (Signed)
Pt's wife called RN with questions regarding pt's current status and plans for pending d/c to [facility]. Dr. Grandville Silos was given her number and stated he would call her.

## 2015-05-01 NOTE — Progress Notes (Signed)
Daughter aware of wounds noted on sacrum, knee, feet, ankles and sores at trach site. Daughter stated she was already aware of the places on the feet/ankles. Family remains thankful for the care the patient has received in the ICU.

## 2015-05-01 NOTE — Progress Notes (Signed)
Patient ID: [name], male   DOB: 1965-09-02, 50 y.o.   MRN: [id number] Stable. Dc staples before discharge

## 2015-05-02 LAB — GLUCOSE, CAPILLARY
GLUCOSE-CAPILLARY: 105 mg/dL — AB (ref 65–99)
GLUCOSE-CAPILLARY: 104 mg/dL — AB (ref 65–99)
GLUCOSE-CAPILLARY: 112 mg/dL — AB (ref 65–99)
Glucose-Capillary: 108 mg/dL — ABNORMAL HIGH (ref 65–99)
Glucose-Capillary: 117 mg/dL — ABNORMAL HIGH (ref 65–99)
Glucose-Capillary: 135 mg/dL — ABNORMAL HIGH (ref 65–99)

## 2015-05-02 MED ORDER — POLYVINYL ALCOHOL 1.4 % OP SOLN
1.0000 [drp] | OPHTHALMIC | Status: DC | PRN
  Administered 2015-05-03: 1 [drp] via OPHTHALMIC
  Filled 2015-05-02: qty 15

## 2015-05-02 NOTE — Progress Notes (Signed)
Insurance has denied admission to [facility], stating " insurance will not authorize transfer from one acute care facility to another if members' needs can be met in current facility."  Notified Trauma Service PA/MD; peer to peer review is available.  Contact information for P2P:  Ref # [id number], Phone # [phone], ext [phone].   Pt's wife, Maudie Mercury, made aware of denial, and possibility of Peer to Peer review.  She is understandably disappointed.    Will continue to follow progress  Reinaldo Raddle, RN, BSN  Trauma/Neuro ICU Case Manager [phone]
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
## 2015-05-02 NOTE — Progress Notes (Signed)
Trauma Service Note  Subjective: Patient is communicative.  No distress.  Scheduled to go to [facility] unit today.  Objective: Vital signs in last 24 hours: Temp:  [98.4 F (36.9 C)-101.3 F (38.5 C)] 101.2 F (38.4 C) (01/25 0400) Pulse Rate:  [77-94] 84 (01/25 0756) Resp:  [14-30] 18 (01/25 0756) BP: (111-140)/(61-78) 121/68 mmHg (01/25 0756) SpO2:  [95 %-100 %] 99 % (01/25 0756) FiO2 (%):  [40 %] 40 % (01/25 0756) Weight:  [68 kg (149 lb 14.6 oz)] 68 kg (149 lb 14.6 oz) (01/25 0500) Last BM Date: 05/01/15  Intake/Output from previous day: 01/24 0701 - 01/25 0700 In: 3380 [I.V.:1440; NG/GT:1440; IV Piggyback:500] Out: 2675 [Urine:2675] Intake/Output this shift:    General: No acute distress.  Did have ever to 101.5.  Still on antibiotics/Vancomycin  Lungs: Clear.  Lots of paratracheal secretions.  Abd: Soft.  Tolerating tube feedings well.  Extremities: No changes  Neuro: Intact.  Still low level incomplete quadriplegia.  Lab Results: CBC   Recent Labs  05/01/15 0331  WBC 12.6*  HGB 7.4*  HCT 23.2*  PLT 524*   BMET  Recent Labs  05/01/15 0331  NA 141  K 4.1  CL 102  CO2 28  GLUCOSE 125*  BUN 20  CREATININE 0.57*  CALCIUM 8.6*   PT/INR No results for input(s): LABPROT, INR in the last 72 hours. ABG No results for input(s): PHART, HCO3 in the last 72 hours.  Invalid input(s): PCO2, PO2  Studies/Results: Dg Cervical Spine 2 Or 3 Views  05/01/2015  CLINICAL DATA:  Status post C4-5 fracture and C7-T1 fracture dislocation with subsequent fusion. Initial encounter. EXAM: CERVICAL SPINE - 2-3 VIEW COMPARISON:  None. FINDINGS: The patient is status post C4-5 ACDF. Pedicle screws and stabilization bars are in place from C3-T3. Hardware appears intact. No acute abnormality is identified. Tracheostomy tube is noted. IMPRESSION: Status post C4-5 ACDF and C3-T3 posterior fusion without evidence of complication Electronically Signed   By: Inge Rise M.D.    On: 05/01/2015 18:12   Dg Thoracic Spine 2 View  05/01/2015  CLINICAL DATA:  50 year old male -status post cervical and thoracic fusion for fractures. EXAM: THORACIC SPINE 2 VIEWS COMPARISON:  05/01/2015 FINDINGS: Posterior rod and bipedicular screw fixation again identified overlying the lower cervical spine and T1, T2 and T3. No definite complicating features are identified. A tracheostomy tube, bilateral lower lung atelectasis/ consolidation and effusions again noted. IMPRESSION: Cervicothoracic surgical hardware without definite complicating features. Bilateral lower lung atelectasis/ consolidation and effusions again noted. Electronically Signed   By: Margarette Canada M.D.   On: 05/01/2015 18:31    Anti-infectives: Anti-infectives    Start     Dose/Rate Route Frequency Ordered Stop   04/18/15 1400  piperacillin-tazobactam (ZOSYN) IVPB 3.375 g  Status:  Discontinued     3.375 g 12.5 mL/hr over 240 Minutes Intravenous 3 times per day 04/18/15 0934 04/22/15 0953   04/18/15 1000  vancomycin (VANCOCIN) 1,250 mg in sodium chloride 0.9 % 250 mL IVPB     1,250 mg 166.7 mL/hr over 90 Minutes Intravenous Every 8 hours 04/18/15 0936     04/18/15 0945  piperacillin-tazobactam (ZOSYN) IVPB 3.375 g     3.375 g 100 mL/hr over 30 Minutes Intravenous  Once 04/18/15 0934 04/18/15 1145   04/17/15 1000  vancomycin (VANCOCIN) 1,250 mg in sodium chloride 0.9 % 250 mL IVPB     1,250 mg 166.7 mL/hr over 90 Minutes Intravenous To Neuro OR-Station #32 04/17/15 0957 04/17/15  1214   04/13/15 2200  ceFEPIme (MAXIPIME) 1 g in dextrose 5 % 50 mL IVPB  Status:  Discontinued     1 g 100 mL/hr over 30 Minutes Intravenous 3 times per day 04/13/15 2141 04/15/15 1050   04/10/15 2100  vancomycin (VANCOCIN) 1,250 mg in sodium chloride 0.9 % 250 mL IVPB  Status:  Discontinued     1,250 mg 166.7 mL/hr over 90 Minutes Intravenous Every 8 hours 04/10/15 2057 04/15/15 1050   04/08/15 2000  vancomycin (VANCOCIN) IVPB 1000 mg/200 mL  premix  Status:  Discontinued     1,000 mg 200 mL/hr over 60 Minutes Intravenous Every 8 hours 04/08/15 1045 04/10/15 2057   04/08/15 1200  vancomycin (VANCOCIN) 1,500 mg in sodium chloride 0.9 % 500 mL IVPB     1,500 mg 250 mL/hr over 120 Minutes Intravenous  Once 04/08/15 1045 04/08/15 1426   04/05/15 1030  piperacillin-tazobactam (ZOSYN) IVPB 3.375 g  Status:  Discontinued     3.375 g 12.5 mL/hr over 240 Minutes Intravenous 3 times per day 04/05/15 0946 04/11/15 1058      Assessment/Plan: s/p Procedure(s): TRACHEOSTOMY PERCUTANEOUS ENDOSCOPIC GASTROSTOMY (PEG) PLACEMENT Patient scheduled to transfer to [place] today.  LOS: 27 days   [name]. Dahlia Bailiff, MD, FACS [phone] Trauma Surgeon 05/02/2015

## 2015-05-02 NOTE — Progress Notes (Signed)
   05/02/15 0914  Vent [facility]  Invasive or Noninvasive Invasive  Adult Vent Y  Adult Ventilator Settings  Vent Type Servo i  Humidity HME  Vent Mode PSV  Vt Set 620 mL  Set Rate 14 bmp  FiO2 (%) 40 %  I Time 0.9 Sec(s)  PEEP 5 cmH20  Adult Ventilator Measurements  Peak Airway Pressure 31 L/min  Mean Airway Pressure 14 cmH20  Resp Rate Spontaneous 6 br/min  Resp Rate Total 20 br/min  Exhaled Vt 627 mL  Measured Ve 13.5 mL  HOB> 30 Degrees Y  Adult Ventilator Alarms  Alarms On Y  Ve High Alarm 20 L/min  Ve Low Alarm 5 L/min  Resp Rate High Alarm 35 br/min  Resp Rate Low Alarm 10  PEEP Low Alarm 3 cmH2O  Press High Alarm 50 cmH2O  Suction Method  Suctioning Airway  Airway Suctioning/Secretions  Suction Type Tracheal  Suction Device  Catheter  Secretion Amount Copious  Secretion Color Tan  Secretion Consistency Thick  Suction Tolerance Tolerated well  Suctioning Adverse Effects None  Placed patient back on previous settings prvc due to increased RR.  RN aware.

## 2015-05-02 NOTE — Progress Notes (Signed)
    [place] for Infectious Disease   Reason for visit: Follow up on fever  Interval History: repeat blood cultures remain negative. Venous duplex noted and DVT in multiple areas, full dose anticoagulation started.  No change of FiO2 on ventilator still, remaining at 40%.   Fever again to Tmax 101.    Physical Exam: Constitutional:  Filed Vitals:   05/02/15 0756 05/02/15 0800  BP: 121/68 124/68  Pulse: 84 83  Temp:    Resp: 18 22  + trach; awake, responds to voice Respiratory: on vent with trach; no respiratory distress noted Cardiovascular: RRR GI: soft, normoactive bowel sounds MS: no edema  Review of Systems: Constitutional: denies chills when asked   Lab Results  Component Value Date   WBC 12.6* 05/01/2015   HGB 7.4* 05/01/2015   HCT 23.2* 05/01/2015   MCV 85.9 05/01/2015   PLT 524* 05/01/2015    Lab Results  Component Value Date   CREATININE 0.57* 05/01/2015   BUN 20 05/01/2015   NA 141 05/01/2015   K 4.1 05/01/2015   CL 102 05/01/2015   CO2 28 05/01/2015    Lab Results  Component Value Date   ALT 182* 04/19/2015   AST 123* 04/19/2015   ALKPHOS 170* 04/19/2015     Microbiology: Recent Results (from the past 240 hour(s))  Culture, blood (routine x 2)     Status: None   Collection Time: 04/25/15  5:26 PM  Result Value Ref Range Status   Specimen Description BLOOD LEFT ARM  Final   Special Requests BOTTLES DRAWN AEROBIC AND ANAEROBIC 5CC  Final   Culture NO GROWTH 5 DAYS  Final   Report Status 04/30/2015 FINAL  Final  Culture, blood (routine x 2)     Status: None   Collection Time: 04/25/15  5:32 PM  Result Value Ref Range Status   Specimen Description BLOOD LEFT FOREARM  Final   Special Requests BOTTLES DRAWN AEROBIC ONLY 2CC  Final   Culture NO GROWTH 5 DAYS  Final   Report Status 04/30/2015 FINAL  Final  Culture, blood (routine x 2)     Status: None (Preliminary result)   Collection Time: 05/01/15 10:18 AM  Result Value Ref Range Status     Specimen Description BLOOD RIGHT ARM  Final   Special Requests BOTTLES DRAWN AEROBIC AND ANAEROBIC 10CC  Final   Culture PENDING  Incomplete   Report Status PENDING  Incomplete    Impression/Plan:  1. Fever - 101.  No infectious etiology identified.  Likely secondary to clots.  2. Staph aureus - pneumonia - will stop vancomycin now.  Completed treatment.  3. dispo - possibly to [facility] today    4. DVTs - on full dose anticoagulation

## 2015-05-02 NOTE — Progress Notes (Signed)
   05/02/15 0825  Vent [facility]  Invasive or Noninvasive Invasive  Adult Vent Y  Adult Ventilator Settings  Vent Type Servo i  Humidity HME  Vent Mode PSV  FiO2 (%) 40 %  I Time 0.9 Sec(s)  Pressure Support 10 cmH20  PEEP 5 cmH20  Adult Ventilator Measurements  Peak Airway Pressure 16 L/min  Mean Airway Pressure 9 cmH20  Resp Rate Spontaneous 24 br/min  Resp Rate Total 24 br/min  Exhaled Vt 437 mL  Measured Ve 10 mL  HOB> 30 Degrees Y  Adult Ventilator Alarms  Alarms On Y  Suction Method  Suctioning Airway  Airway Suctioning/Secretions  Suction Type Tracheal  Suction Device  Catheter  Secretion Amount Copious  Secretion Color Tan  Secretion Consistency Thick  Suction Tolerance Tolerated well  Suctioning Adverse Effects None  Placed patient on PSV per MD request.  10/5 30%.  Patient is tolerating well at this time.  RN aware.

## 2015-05-02 NOTE — Progress Notes (Signed)
   05/02/15 1235  Clinical Encounter Type  Visited With Patient;Health care provider  Visit Type Follow-up;Social support;Critical Care  Referral From Nurse   Chaplain received consultation and concern about how much the patient seems to understand about his medical situation. Chaplain explored that with him some, and the patient seems to feel knowledgeable about what's going on. Of course all of that is through chaplain's attempts at lip-reading. Chaplain offered support, and our support is available as needed.   Jeri Lager, Chaplain 05/02/2015 12:37 PM
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
## 2015-05-02 NOTE — Progress Notes (Signed)
Patient ID: [name], male   DOB: May 12, 1965, 50 y.o.   MRN: [id number] Rays show hardware in place. Wounds clear. Rehab to see re exercises

## 2015-05-03 ENCOUNTER — Inpatient Hospital Stay (HOSPITAL_COMMUNITY): Payer: BLUE CROSS/BLUE SHIELD

## 2015-05-03 DIAGNOSIS — R55 Syncope and collapse: Secondary | ICD-10-CM

## 2015-05-03 DIAGNOSIS — I469 Cardiac arrest, cause unspecified: Secondary | ICD-10-CM

## 2015-05-03 LAB — GLUCOSE, CAPILLARY
GLUCOSE-CAPILLARY: 121 mg/dL — AB (ref 65–99)
GLUCOSE-CAPILLARY: 125 mg/dL — AB (ref 65–99)
Glucose-Capillary: 120 mg/dL — ABNORMAL HIGH (ref 65–99)
Glucose-Capillary: 130 mg/dL — ABNORMAL HIGH (ref 65–99)
Glucose-Capillary: 139 mg/dL — ABNORMAL HIGH (ref 65–99)
Glucose-Capillary: 99 mg/dL (ref 65–99)

## 2015-05-03 LAB — CBC
HEMATOCRIT: 23.3 % — AB (ref 39.0–52.0)
Hemoglobin: 7.5 g/dL — ABNORMAL LOW (ref 13.0–17.0)
MCH: 27.6 pg (ref 26.0–34.0)
MCHC: 32.2 g/dL (ref 30.0–36.0)
MCV: 85.7 fL (ref 78.0–100.0)
PLATELETS: 523 10*3/uL — AB (ref 150–400)
RBC: 2.72 MIL/uL — AB (ref 4.22–5.81)
RDW: 16 % — ABNORMAL HIGH (ref 11.5–15.5)
WBC: 9.3 10*3/uL (ref 4.0–10.5)

## 2015-05-03 LAB — BASIC METABOLIC PANEL
Anion gap: 6 (ref 5–15)
BUN: 19 mg/dL (ref 6–20)
CHLORIDE: 102 mmol/L (ref 101–111)
CO2: 28 mmol/L (ref 22–32)
CREATININE: 0.49 mg/dL — AB (ref 0.61–1.24)
Calcium: 8.6 mg/dL — ABNORMAL LOW (ref 8.9–10.3)
GFR calc non Af Amer: >60 mL/min (ref >60)
Glucose, Bld: 116 mg/dL — ABNORMAL HIGH (ref 65–99)
POTASSIUM: 4.3 mmol/L (ref 3.5–5.1)
Sodium: 136 mmol/L (ref 135–145)

## 2015-05-03 MED ORDER — ATROPINE SULFATE 0.1 MG/ML IJ SOLN
INTRAMUSCULAR | Status: AC
  Filled 2015-05-03: qty 10

## 2015-05-03 MED ORDER — CHLORHEXIDINE GLUCONATE 0.12 % MT SOLN
OROMUCOSAL | Status: AC
  Administered 2015-05-03: 15 mL
  Filled 2015-05-03: qty 15

## 2015-05-03 MED ORDER — CLONAZEPAM 1 MG PO TABS
1.0000 mg | ORAL_TABLET | Freq: Two times a day (BID) | ORAL | Status: DC
  Administered 2015-05-03 – 2015-05-08 (×11): 1 mg via ORAL
  Filled 2015-05-03 (×12): qty 1

## 2015-05-03 NOTE — Progress Notes (Signed)
Covering for unit RT- RN called d/t pt desat to 70's-80's. Pt was placed on 100% immediately by RN.  [facility] w/ rhonchi t/o, sx for copious secretion.  Pt continue to desat, recruitment maneuver performed- pt appeared to tol this well.  Trauma MD- Dr Grandville Silos was paged, rec'd order for peep increase to 10.  Fio2 increased to 60%, sat 98-99% now, RN aware.  Pt appears to be tol well so far, VSS now. Unit RT notified.
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
## 2015-05-03 NOTE — Progress Notes (Signed)
While on trach collar, at 0942 pt HR went to 40 bpm then asystole. Few chest compressions given. ROSC after atropine given. Trauma notified. Placed back on the vent at full support.
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
## 2015-05-03 NOTE — Progress Notes (Signed)
Patient ID: [name], male   DOB: 09-Jul-1965, 50 y.o.   MRN: [id number] To be transfer in am staples out today

## 2015-05-03 NOTE — Procedures (Signed)
Bedside Bronchoscopy Procedure Note NOLBERTO MCDADE [id number] 06-Oct-1965  Procedure: Bronchoscopy Indications: Diagnostic evaluation of the airways, Obtain specimens for culture and/or other diagnostic studies and Remove secretions  Procedure Details: Trach Size: # 6 cuffed shiley  Bite block in place: No trach  In preparation for procedure, Patient hyper-oxygenated with 100 % FiO2 and Saline given via ETT (20 ml) Airway entered and the following bronchi were examined: RUL, RML, RLL, LUL, LLL and Bronchi.   Bronchoscope removed.  , Patient placed back on 100% FiO2 at conclusion of procedure.    Evaluation BP 121/78 mmHg  Pulse 88  Temp(Src) 101.6 F (38.7 C) (Axillary)  Resp 19  Ht 6' (1.829 m)  Wt 158 lb 15.2 oz (72.1 kg)  BMI 21.55 kg/m2  SpO2 100% Breath Sounds:Rhonch O2 sats: stable throughout Patient's Current Condition: stable Specimens:  Sent thick tan secretions Complications: No apparent complications Patient did tolerate procedure well.   Claretta Fraise 05/03/2015, 3:14 PM
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
## 2015-05-03 NOTE — Progress Notes (Signed)
SLP Cancellation Note  Patient Details Name: [name] MRN: [id number] DOB: 15-Oct-1965   Cancelled treatment:       Reason Eval/Treat Not Completed: Medical issues which prohibited therapy. Will defer therapy today to allow recovery from acute event this am. Will f/u 1/27. Recommend consideration of daily in-line PMSV trials as a means towards trach collar given combination of decline on trach collar and anxiety.   [place], CCC-SLP [phone]    [name] 05/03/2015, 10:32 AM

## 2015-05-03 NOTE — Procedures (Signed)
Bronchoscopy Procedure Note [name] [id number] Jul 30, 1965  Procedure: Bronchoscopy Indications: Remove secretions  Procedure Details Consent: Risks of procedure as well as the alternatives and risks of each were explained to the (patient/caregiver).  Consent for procedure obtained. Time Out: Verified patient identification, verified procedure, site/side was marked, verified correct patient position, special equipment/implants available, medications/allergies/relevent history reviewed, required imaging and test results available.  Performed  In preparation for procedure, patient was given 100% FiO2. Sedation: Benzodiazepines and fentanyl  Airway entered and the following bronchi were examined: RUL, RML, RLL, LUL, LLL and Bronchi.   Moderate purulent secretions were lavaged until clear. Bronchoscope removed.    Evaluation Hemodynamic Status: BP stable throughout; O2 sats: stable throughout Patient's Current Condition: stable Specimens:  BAL Complications: No apparent complications Patient did tolerate procedure well.   [name] E 05/03/2015

## 2015-05-04 ENCOUNTER — Inpatient Hospital Stay (HOSPITAL_COMMUNITY): Payer: BLUE CROSS/BLUE SHIELD

## 2015-05-04 LAB — GLUCOSE, CAPILLARY
GLUCOSE-CAPILLARY: 108 mg/dL — AB (ref 65–99)
GLUCOSE-CAPILLARY: 114 mg/dL — AB (ref 65–99)
Glucose-Capillary: 116 mg/dL — ABNORMAL HIGH (ref 65–99)
Glucose-Capillary: 120 mg/dL — ABNORMAL HIGH (ref 65–99)
Glucose-Capillary: 114 mg/dL — ABNORMAL HIGH (ref 65–99)

## 2015-05-04 LAB — BASIC METABOLIC PANEL
ANION GAP: 7 (ref 5–15)
BUN: 23 mg/dL — AB (ref 6–20)
CO2: 28 mmol/L (ref 22–32)
Calcium: 8.8 mg/dL — ABNORMAL LOW (ref 8.9–10.3)
Chloride: 101 mmol/L (ref 101–111)
Creatinine, Ser: 0.59 mg/dL — ABNORMAL LOW (ref 0.61–1.24)
Glucose, Bld: 114 mg/dL — ABNORMAL HIGH (ref 65–99)
POTASSIUM: 4.5 mmol/L (ref 3.5–5.1)
SODIUM: 136 mmol/L (ref 135–145)

## 2015-05-04 LAB — BLOOD GAS, ARTERIAL
ACID-BASE EXCESS: 5.5 mmol/L — AB (ref 0.0–2.0)
Bicarbonate: 29.4 mEq/L — ABNORMAL HIGH (ref 20.0–24.0)
DRAWN BY: 331761
FIO2: 0.6
MECHVT: 620 mL
O2 Saturation: 92.8 %
PEEP: 10 cmH2O
PO2 ART: 69.6 mmHg — AB (ref 80.0–100.0)
Patient temperature: 98.9
RATE: 14 resp/min
TCO2: 30.7 mmol/L (ref 0–100)
pCO2 arterial: 42.8 mmHg (ref 35.0–45.0)
pH, Arterial: 7.453 — ABNORMAL HIGH (ref 7.350–7.450)

## 2015-05-04 LAB — CBC
HCT: 24.9 % — ABNORMAL LOW (ref 39.0–52.0)
Hemoglobin: 7.7 g/dL — ABNORMAL LOW (ref 13.0–17.0)
MCH: 26.6 pg (ref 26.0–34.0)
MCHC: 30.9 g/dL (ref 30.0–36.0)
MCV: 86.2 fL (ref 78.0–100.0)
PLATELETS: 531 10*3/uL — AB (ref 150–400)
RBC: 2.89 MIL/uL — AB (ref 4.22–5.81)
RDW: 16.3 % — ABNORMAL HIGH (ref 11.5–15.5)
WBC: 10.9 10*3/uL — AB (ref 4.0–10.5)

## 2015-05-04 LAB — MAGNESIUM: Magnesium: 2.1 mg/dL (ref 1.7–2.4)

## 2015-05-04 MED ORDER — GLYCOPYRROLATE 1 MG PO TABS
4.0000 mg | ORAL_TABLET | Freq: Two times a day (BID) | ORAL | Status: DC
  Administered 2015-05-04 – 2015-05-08 (×9): 4 mg
  Filled 2015-05-04 (×11): qty 4

## 2015-05-04 MED ORDER — FENTANYL 100 MCG/HR TD PT72
100.0000 ug | MEDICATED_PATCH | TRANSDERMAL | Status: DC
  Administered 2015-05-04 – 2015-05-07 (×2): 100 ug via TRANSDERMAL
  Filled 2015-05-04 (×2): qty 1

## 2015-05-04 NOTE — Clinical Social Work Note (Signed)
Clinical Social Worker participated in family meeting with MD, RN, CM, patient, patient wife, sister, son, and daughter.  Patient remains in agreement to continue full aggressive care at this time.  Patient and family aware of the potential for vent SNF pending patient ability to wean from the ventilator.  Patient and family verbalize understanding of patient current medical status and potential barriers for discharge.  CSW remains available for support and will complete full assessment at a later time.  Barbette Or, [place]

## 2015-05-04 NOTE — Progress Notes (Signed)
Discussed bracing needs with Dr. Joya Salm and acute care OT.
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
## 2015-05-04 NOTE — Progress Notes (Signed)
Follow up - Trauma and Critical Care  Patient Details:    [name] is an 50 y.o. male.  Lines/tubes : Gastrostomy/Enterostomy Percutaneous endoscopic gastrostomy (PEG) LUQ (Active)  Surrounding Skin Dry 05/03/2015  8:00 PM  Tube Status Patent 05/03/2015  8:00 PM  Drainage Appearance Tan;Thick 05/02/2015  4:10 PM  Catheter Position (cm marking) 4.5 cm 04/29/2015  8:00 AM  Dressing Status None 05/03/2015  8:00 PM  Dressing Intervention New dressing 05/03/2015  4:00 PM  Dressing Type Abdominal Binder;Split gauze 05/03/2015  8:00 PM  G Port Intake (mL) 100 ml 04/30/2015  8:00 AM     Urethral Catheter [name], RN Latex;Straight-tip 16 Fr. (Active)  Indication for Insertion or Continuance of Catheter Chronic catheter use 05/04/2015  7:41 AM  Site Assessment Clean;Intact 05/03/2015  8:00 PM  Catheter Maintenance Bag below level of bladder;Catheter secured;Drainage bag/tubing not touching floor;Bag emptied prior to transport;Seal intact;No dependent loops;Insertion date on drainage bag 05/03/2015  8:00 PM  Collection Container Standard drainage bag 05/03/2015  8:00 PM  Securement Method Securing device (Describe) 05/03/2015  8:00 PM  Urinary Catheter Interventions Unclamped 05/03/2015  8:00 PM  Output (mL) 125 mL 05/04/2015  3:00 AM    Microbiology/Sepsis markers: Results for orders placed or performed during the hospital encounter of 04/05/15  MRSA PCR Screening     Status: None   Collection Time: 04/05/15  3:00 AM  Result Value Ref Range Status   MRSA by PCR NEGATIVE NEGATIVE Final    Comment:        The GeneXpert MRSA Assay (FDA approved for NASAL specimens only), is one component of a comprehensive MRSA colonization surveillance program. It is not intended to diagnose MRSA infection nor to guide or monitor treatment for MRSA infections.   Culture, respiratory (NON-Expectorated)     Status: None   Collection Time: 04/08/15 10:48 AM  Result Value Ref Range Status   Specimen Description TRACHEAL ASPIRATE  Final   Special Requests Normal  Final   Gram Stain   Final    ABUNDANT WBC PRESENT, PREDOMINANTLY PMN FEW SQUAMOUS EPITHELIAL CELLS PRESENT ABUNDANT GRAM POSITIVE COCCI IN CLUSTERS Performed at [facility]    Culture   Final    ABUNDANT METHICILLIN RESISTANT STAPHYLOCOCCUS AUREUS Note: RIFAMPIN AND GENTAMICIN SHOULD NOT BE USED AS SINGLE DRUGS FOR TREATMENT OF STAPH INFECTIONS. This organism DOES NOT demonstrate inducible Clindamycin resistance in vitro. CRITICAL RESULT CALLED TO, READ BACK BY AND VERIFIED WITH: [name] 1/4  @0805  BY REAMM Performed at [facility]    Report Status 04/11/2015 FINAL  Final   Organism ID, Bacteria METHICILLIN RESISTANT STAPHYLOCOCCUS AUREUS  Final      Susceptibility   Methicillin resistant staphylococcus aureus - MIC*    CLINDAMYCIN <=0.25 SENSITIVE Sensitive     ERYTHROMYCIN >=8 RESISTANT Resistant     GENTAMICIN <=0.5 SENSITIVE Sensitive     LEVOFLOXACIN 4 INTERMEDIATE Intermediate     OXACILLIN >=4 RESISTANT Resistant     RIFAMPIN <=0.5 SENSITIVE Sensitive     TRIMETH/SULFA <=10 SENSITIVE Sensitive     VANCOMYCIN 1 SENSITIVE Sensitive     TETRACYCLINE <=1 SENSITIVE Sensitive     * ABUNDANT METHICILLIN RESISTANT STAPHYLOCOCCUS AUREUS  Culture, blood (routine x 2)     Status: None   Collection Time: 04/13/15  9:55 PM  Result Value Ref Range Status   Specimen Description BLOOD LEFT HAND  Final   Special Requests IN PEDIATRIC BOTTLE 4CC  Final   Culture NO  GROWTH 5 DAYS  Final   Report Status 04/18/2015 FINAL  Final  Culture, blood (routine x 2)     Status: None   Collection Time: 04/13/15 10:05 PM  Result Value Ref Range Status   Specimen Description BLOOD LEFT HAND  Final   Special Requests IN PEDIATRIC BOTTLE 2CC  Final   Culture  Setup Time   Final    GRAM POSITIVE COCCI IN CLUSTERS PED BOTTLE CALLED TO [place] RN 04/14/15 1828 [name],K    Culture   Final    STAPHYLOCOCCUS  SPECIES (COAGULASE NEGATIVE) THE SIGNIFICANCE OF ISOLATING THIS ORGANISM FROM A SINGLE SET OF BLOOD CULTURES WHEN MULTIPLE SETS ARE DRAWN IS UNCERTAIN. PLEASE NOTIFY THE MICROBIOLOGY DEPARTMENT WITHIN ONE WEEK IF SPECIATION AND SENSITIVITIES ARE REQUIRED.    Report Status 04/15/2015 FINAL  Final  Culture, Urine     Status: None   Collection Time: 04/13/15 11:44 PM  Result Value Ref Range Status   Specimen Description URINE, CATHETERIZED  Final   Special Requests Vancomycin Normal  Final   Culture NO GROWTH 1 DAY  Final   Report Status 04/15/2015 FINAL  Final  Culture, Urine     Status: None   Collection Time: 04/18/15  8:00 AM  Result Value Ref Range Status   Specimen Description URINE, CATHETERIZED  Final   Special Requests NONE  Final   Culture NO GROWTH 1 DAY  Final   Report Status 04/19/2015 FINAL  Final  Culture, blood (Routine X 2) w Reflex to ID Panel     Status: None   Collection Time: 04/18/15  9:00 AM  Result Value Ref Range Status   Specimen Description BLOOD LEFT ARM  Final   Special Requests IN PEDIATRIC BOTTLE 2CC  Final   Culture  Setup Time   Final    GRAM POSITIVE COCCI IN CLUSTERS IN PEDIATRIC BOTTLE CRITICAL RESULT CALLED TO, READ BACK BY AND VERIFIED WITH: [name] @0659  04/19/15 [name]    Culture STAPHYLOCOCCUS SPECIES (COAGULASE NEGATIVE)  Final   Report Status 04/21/2015 FINAL  Final   Organism ID, Bacteria STAPHYLOCOCCUS SPECIES (COAGULASE NEGATIVE)  Final      Susceptibility   Staphylococcus species (coagulase negative) - MIC*    CIPROFLOXACIN >=8 RESISTANT Resistant     ERYTHROMYCIN >=8 RESISTANT Resistant     GENTAMICIN 8 INTERMEDIATE Intermediate     OXACILLIN >=4 RESISTANT Resistant     TETRACYCLINE 2 SENSITIVE Sensitive     VANCOMYCIN 1 SENSITIVE Sensitive     TRIMETH/SULFA 160 RESISTANT Resistant     CLINDAMYCIN >=8 RESISTANT Resistant     RIFAMPIN <=0.5 SENSITIVE Sensitive     Inducible Clindamycin NEGATIVE Sensitive     * STAPHYLOCOCCUS  SPECIES (COAGULASE NEGATIVE)  Culture, blood (Routine X 2) w Reflex to ID Panel     Status: None   Collection Time: 04/18/15  9:07 AM  Result Value Ref Range Status   Specimen Description BLOOD LEFT ARM  Final   Special Requests IN PEDIATRIC BOTTLE 4CC  Final   Culture NO GROWTH 5 DAYS  Final   Report Status 04/23/2015 FINAL  Final  Culture, respiratory (NON-Expectorated)     Status: None   Collection Time: 04/18/15  7:39 PM  Result Value Ref Range Status   Specimen Description TRACHEAL ASPIRATE  Final   Special Requests Normal  Final   Gram Stain   Final    FEW WBC PRESENT,BOTH PMN AND MONONUCLEAR RARE SQUAMOUS EPITHELIAL CELLS PRESENT ABUNDANT GRAM POSITIVE COCCI IN  PAIRS Performed at [facility]   Final    ABUNDANT METHICILLIN RESISTANT STAPHYLOCOCCUS AUREUS Note: RIFAMPIN AND GENTAMICIN SHOULD NOT BE USED AS SINGLE DRUGS FOR TREATMENT OF STAPH INFECTIONS. This organism DOES NOT demonstrate inducible Clindamycin resistance in vitro. Performed at [facility]    Report Status 04/21/2015 FINAL  Final   Organism ID, Bacteria METHICILLIN RESISTANT STAPHYLOCOCCUS AUREUS  Final      Susceptibility   Methicillin resistant staphylococcus aureus - MIC*    CLINDAMYCIN <=0.25 SENSITIVE Sensitive     ERYTHROMYCIN >=8 RESISTANT Resistant     GENTAMICIN <=0.5 SENSITIVE Sensitive     LEVOFLOXACIN >=8 RESISTANT Resistant     OXACILLIN >=4 RESISTANT Resistant     RIFAMPIN <=0.5 SENSITIVE Sensitive     TRIMETH/SULFA <=10 SENSITIVE Sensitive     VANCOMYCIN 1 SENSITIVE Sensitive     TETRACYCLINE <=1 SENSITIVE Sensitive     * ABUNDANT METHICILLIN RESISTANT STAPHYLOCOCCUS AUREUS  Culture, blood (routine x 2)     Status: None   Collection Time: 04/25/15  5:26 PM  Result Value Ref Range Status   Specimen Description BLOOD LEFT ARM  Final   Special Requests BOTTLES DRAWN AEROBIC AND ANAEROBIC 5CC  Final   Culture NO GROWTH 5 DAYS  Final   Report Status 04/30/2015  FINAL  Final  Culture, blood (routine x 2)     Status: None   Collection Time: 04/25/15  5:32 PM  Result Value Ref Range Status   Specimen Description BLOOD LEFT FOREARM  Final   Special Requests BOTTLES DRAWN AEROBIC ONLY 2CC  Final   Culture NO GROWTH 5 DAYS  Final   Report Status 04/30/2015 FINAL  Final  Culture, blood (routine x 2)     Status: None (Preliminary result)   Collection Time: 05/01/15 10:09 AM  Result Value Ref Range Status   Specimen Description BLOOD RIGHT ARM  Final   Special Requests BOTTLES DRAWN AEROBIC ONLY 4 CC  Final   Culture NO GROWTH 2 DAYS  Final   Report Status PENDING  Incomplete  Culture, blood (routine x 2)     Status: None (Preliminary result)   Collection Time: 05/01/15 10:18 AM  Result Value Ref Range Status   Specimen Description BLOOD RIGHT ARM  Final   Special Requests BOTTLES DRAWN AEROBIC AND ANAEROBIC 10CC  Final   Culture NO GROWTH 2 DAYS  Final   Report Status PENDING  Incomplete  Culture, bal-quantitative     Status: None (Preliminary result)   Collection Time: 05/03/15  3:14 PM  Result Value Ref Range Status   Specimen Description BRONCHIAL ALVEOLAR LAVAGE  Final   Special Requests NONE  Final   Gram Stain   Final    ABUNDANT WBC PRESENT, PREDOMINANTLY PMN NO SQUAMOUS EPITHELIAL CELLS SEEN MODERATE GRAM NEGATIVE RODS Performed at [facility]    Culture PENDING  Incomplete   Report Status PENDING  Incomplete    Anti-infectives:  Anti-infectives    Start     Dose/Rate Route Frequency Ordered Stop   04/18/15 1400  piperacillin-tazobactam (ZOSYN) IVPB 3.375 g  Status:  Discontinued     3.375 g 12.5 mL/hr over 240 Minutes Intravenous 3 times per day 04/18/15 0934 04/22/15 0953   04/18/15 1000  vancomycin (VANCOCIN) 1,250 mg in sodium chloride 0.9 % 250 mL IVPB  Status:  Discontinued     1,250 mg 166.7 mL/hr over 90 Minutes Intravenous Every 8 hours 04/18/15 0936 05/02/15 [id number]  04/18/15 0945  piperacillin-tazobactam  (ZOSYN) IVPB 3.375 g     3.375 g 100 mL/hr over 30 Minutes Intravenous  Once 04/18/15 0934 04/18/15 1145   04/17/15 1000  vancomycin (VANCOCIN) 1,250 mg in sodium chloride 0.9 % 250 mL IVPB     1,250 mg 166.7 mL/hr over 90 Minutes Intravenous To Neuro OR-Station #32 04/17/15 0957 04/17/15 1214   04/13/15 2200  ceFEPIme (MAXIPIME) 1 g in dextrose 5 % 50 mL IVPB  Status:  Discontinued     1 g 100 mL/hr over 30 Minutes Intravenous 3 times per day 04/13/15 2141 04/15/15 1050   04/10/15 2100  vancomycin (VANCOCIN) 1,250 mg in sodium chloride 0.9 % 250 mL IVPB  Status:  Discontinued     1,250 mg 166.7 mL/hr over 90 Minutes Intravenous Every 8 hours 04/10/15 2057 04/15/15 1050   04/08/15 2000  vancomycin (VANCOCIN) IVPB 1000 mg/200 mL premix  Status:  Discontinued     1,000 mg 200 mL/hr over 60 Minutes Intravenous Every 8 hours 04/08/15 1045 04/10/15 2057   04/08/15 1200  vancomycin (VANCOCIN) 1,500 mg in sodium chloride 0.9 % 500 mL IVPB     1,500 mg 250 mL/hr over 120 Minutes Intravenous  Once 04/08/15 1045 04/08/15 1426   04/05/15 1030  piperacillin-tazobactam (ZOSYN) IVPB 3.375 g  Status:  Discontinued     3.375 g 12.5 mL/hr over 240 Minutes Intravenous 3 times per day 04/05/15 0946 04/11/15 1058      Best Practice/Protocols:  VTE Prophylaxis: Lovenox (full dose) GI Prophylaxis: Proton Pump Inhibitor Continous Sedation  Consults: Treatment Team:  Leeroy Cha, MD    Events:  Subjective:    Overnight Issues: Patient has desaturated overnight.  Up to PEEP +10.    Objective:  Vital signs for last 24 hours: Temp:  [98.4 F (36.9 C)-101.6 F (38.7 C)] 98.9 F (37.2 C) (01/27 0800) Pulse Rate:  [74-106] 80 (01/27 0800) Resp:  [15-26] 21 (01/27 0800) BP: (110-170)/(65-95) 134/80 mmHg (01/27 0800) SpO2:  [90 %-100 %] 96 % (01/27 0800) FiO2 (%):  [40 %-60 %] 50 % (01/27 0748) Weight:  [72 kg (158 lb 11.7 oz)] 72 kg (158 lb 11.7 oz) (01/27 0500)  Hemodynamic parameters for  last 24 hours:    Intake/Output from previous day: 01/26 0701 - 01/27 0700 In: 3040 [I.V.:1500; NG/GT:1440] Out: 3175 [Urine:3175]  Intake/Output this shift:    Vent settings for last 24 hours: Vent Mode:  [-] PRVC FiO2 (%):  [40 %-60 %] 50 % Set Rate:  [14 bmp] 14 bmp Vt Set:  [620 mL] 620 mL PEEP:  [5 cmH20-10 cmH20] 10 cmH20 Plateau Pressure:  [25 cmH20-30 cmH20] 29 cmH20  Physical Exam:  General: alert, no respiratory distress and lots of secretions Neuro: alert, oriented, nonfocal exam, weakness right upper extremity, weakness right lower extremity, weakness left upper extremity, weakness left lower extremity and incomplete quadriplegia Resp: rhonchi bilaterally and very coarse CVS: regular rate and rhythm, S1, S2 normal, no murmur, click, rub or gallop GI: soft, nontender, BS WNL, no r/g and Tolerating tube feedings. Extremities: no edema, no erythema, pulses WNL and Has upper extrermity deep venous clots.  Results for orders placed or performed during the hospital encounter of 04/05/15 (from the past 24 hour(s))  Glucose, capillary     Status: Abnormal   Collection Time: 05/03/15 11:10 AM  Result Value Ref Range   Glucose-Capillary 130 (H) 65 - 99 mg/dL   Comment 1 Notify RN    Comment 2 Document  in Chart   Culture, bal-quantitative     Status: None (Preliminary result)   Collection Time: 05/03/15  3:14 PM  Result Value Ref Range   Specimen Description BRONCHIAL ALVEOLAR LAVAGE    Special Requests NONE    Gram Stain      ABUNDANT WBC PRESENT, PREDOMINANTLY PMN NO SQUAMOUS EPITHELIAL CELLS SEEN MODERATE GRAM NEGATIVE RODS Performed at [facility]    Culture PENDING    Report Status PENDING   Glucose, capillary     Status: None   Collection Time: 05/03/15  4:46 PM  Result Value Ref Range   Glucose-Capillary 99 65 - 99 mg/dL   Comment 1 Notify RN    Comment 2 Document in Chart   Glucose, capillary     Status: Abnormal   Collection Time: 05/03/15  7:24  PM  Result Value Ref Range   Glucose-Capillary 125 (H) 65 - 99 mg/dL  Glucose, capillary     Status: Abnormal   Collection Time: 05/03/15 11:38 PM  Result Value Ref Range   Glucose-Capillary 139 (H) 65 - 99 mg/dL  Glucose, capillary     Status: Abnormal   Collection Time: 05/04/15  3:12 AM  Result Value Ref Range   Glucose-Capillary 108 (H) 65 - 99 mg/dL  CBC     Status: Abnormal   Collection Time: 05/04/15  3:59 AM  Result Value Ref Range   WBC 10.9 (H) 4.0 - 10.5 K/uL   RBC 2.89 (L) 4.22 - 5.81 MIL/uL   Hemoglobin 7.7 (L) 13.0 - 17.0 g/dL   HCT 24.9 (L) 39.0 - 52.0 %   MCV 86.2 78.0 - 100.0 fL   MCH 26.6 26.0 - 34.0 pg   MCHC 30.9 30.0 - 36.0 g/dL   RDW 16.3 (H) 11.5 - 15.5 %   Platelets 531 (H) 150 - 400 K/uL  Basic metabolic panel     Status: Abnormal   Collection Time: 05/04/15  3:59 AM  Result Value Ref Range   Sodium 136 135 - 145 mmol/L   Potassium 4.5 3.5 - 5.1 mmol/L   Chloride 101 101 - 111 mmol/L   CO2 28 22 - 32 mmol/L   Glucose, Bld 114 (H) 65 - 99 mg/dL   BUN 23 (H) 6 - 20 mg/dL   Creatinine, Ser 0.59 (L) 0.61 - 1.24 mg/dL   Calcium 8.8 (L) 8.9 - 10.3 mg/dL   GFR calc non Af Amer >60 >60 mL/min   GFR calc Af Amer >60 >60 mL/min   Anion gap 7 5 - 15  Glucose, capillary     Status: Abnormal   Collection Time: 05/04/15  8:34 AM  Result Value Ref Range   Glucose-Capillary 116 (H) 65 - 99 mg/dL   Comment 1 Notify RN    Comment 2 Document in Chart      Assessment/Plan:   NEURO  Awake and alert.  Not agitated   Plan: Will add fentanyl patch  PULM  Atelectasis/collapse (focal and RLL.  CXR pending today.)   Plan: Continued respiratory care and support.  CARDIO  No arrhythmias surrently.  Tends to become bradycardic with various manipulations.   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  Parasympathetic discharge without sympathetic response leading to bradycardic/asystolic arrest.   Short term  this can only be addressed with a pacemaker which Cardiology has been historically resistant to place, primarily because it does not directly address the cause.  However, we  cannot traditionally wean these patients to trach collar.  RENAL  doing fine with urine output and renal function   Plan: CPM  GI  No issues   Plan: CPM with tube feedings.  ID  Pneumonia (hospital acquired (not ventilator-associated) likely , but currently off antibiotics)   Plan: Patient currently off antibiotics.  He was having fevers on and off antibiotics.  Most recent tracheal aspirate shows GNR on gram stain.  ID is following this patient who is very complex.  HEME  Anemia anemia of critical illness)   Plan: No blood for now  ENDO No specific issues   Plan: CPM  Global Issues  Cardiology has been called but has not seen this patient.  Has multiple decubitii ulcers.  Likely has another pneumonia.  Goals of care need to be discussed with the family and the patient.  This does not mean that we want to de-escalate care, but they need to be enlightened as to where we are headed currently.   LOS: 29 days   Additional comments:I reviewed the patient's new clinical lab test results. cbc/bmet and CXR is pending.  Critical Care Total Time*: 30 Minutes  Shell Blanchette 05/04/2015  *Care during the described time interval was provided by me and/or other providers on the critical care team.  I have reviewed this patient's available data, including medical history, events of note, physical examination and test results as part of my evaluation.

## 2015-05-04 NOTE — Progress Notes (Signed)
PT Cancellation Note  Patient Details Name: [name] MRN: [id number] DOB: 1965/12/20   Cancelled Treatment:    Reason Eval/Treat Not Completed: Patient not medically ready.  Pt with increased secretions orally and via trach.  RN present.  Will hold PT and further mobility at this time.     Catarina Hartshorn, [place] 05/04/2015, 2:28 PM

## 2015-05-04 NOTE — Progress Notes (Signed)
Dr. Hulen Skains, myself, CSW, case manager, the patient's wife, daughter, son, and sister gathered to the bedside for a goals of care discussion with the patient. Dr. Hulen Skains discussed the patient's current status and barriers to weaning off vent at this time. The patient and family decided to continue with aggressive care at this time and are aware that patient may need vent SNF placement if unable to wean off vent.  Pt alert and responsive, nodding appropriately to the decision mentioned above and goal of getting off vent.   Latrelle Dodrill

## 2015-05-04 NOTE — Progress Notes (Signed)
SLP Cancellation Note  Patient Details Name: [name] MRN: [id number] DOB: [birth year]   Cancelled treatment:       Reason Eval/Treat Not Completed: per discussion with RN and RT, medical issues continue to prohibit therapy.     Juan Quam Laurice 05/04/2015, 1:33 PM

## 2015-05-04 NOTE — Progress Notes (Addendum)
Participated in patient/family meeting with attending MD, CSW and multiple family members present.  MD discussed recent issues with being unable to wean from the ventilator due to cardiac issues.   Presented option for discharge should pt continue to be unable to wean, including ventilator skilled nursing facility in either [place] or [place].   MD assured family that our goal will be to continue to wean from ventilator as pulmonary status improves, but made them aware that this could be a lengthy process.  Pt wishes to continue with full aggressive care at this time.    Family remains supportive to patient and his wishes.  Will continue to follow/offer support as needed.    Reinaldo Raddle, RN, BSN  Trauma/Neuro ICU Case Manager [phone]

## 2015-05-05 LAB — GLUCOSE, CAPILLARY
GLUCOSE-CAPILLARY: 108 mg/dL — AB (ref 65–99)
GLUCOSE-CAPILLARY: 124 mg/dL — AB (ref 65–99)
GLUCOSE-CAPILLARY: 112 mg/dL — AB (ref 65–99)
Glucose-Capillary: 106 mg/dL — ABNORMAL HIGH (ref 65–99)
Glucose-Capillary: 120 mg/dL — ABNORMAL HIGH (ref 65–99)
Glucose-Capillary: 131 mg/dL — ABNORMAL HIGH (ref 65–99)

## 2015-05-05 LAB — BASIC METABOLIC PANEL
Anion gap: 8 (ref 5–15)
BUN: 22 mg/dL — ABNORMAL HIGH (ref 6–20)
CALCIUM: 9 mg/dL (ref 8.9–10.3)
CO2: 29 mmol/L (ref 22–32)
Chloride: 99 mmol/L — ABNORMAL LOW (ref 101–111)
Creatinine, Ser: 0.49 mg/dL — ABNORMAL LOW (ref 0.61–1.24)
GLUCOSE: 119 mg/dL — AB (ref 65–99)
Potassium: 4.7 mmol/L (ref 3.5–5.1)
SODIUM: 136 mmol/L (ref 135–145)

## 2015-05-05 LAB — CBC WITH DIFFERENTIAL/PLATELET
BASOS PCT: 0 %
Basophils Absolute: 0 10*3/uL (ref 0.0–0.1)
EOS PCT: 6 %
Eosinophils Absolute: 0.8 10*3/uL — ABNORMAL HIGH (ref 0.0–0.7)
HEMATOCRIT: 23.7 % — AB (ref 39.0–52.0)
HEMOGLOBIN: 7.8 g/dL — AB (ref 13.0–17.0)
LYMPHS PCT: 25 %
Lymphs Abs: 3.2 10*3/uL (ref 0.7–4.0)
MCH: 28.1 pg (ref 26.0–34.0)
MCHC: 32.9 g/dL (ref 30.0–36.0)
MCV: 85.3 fL (ref 78.0–100.0)
MONOS PCT: 5 %
Monocytes Absolute: 0.6 10*3/uL (ref 0.1–1.0)
NEUTROS ABS: 8.3 10*3/uL — AB (ref 1.7–7.7)
NEUTROS PCT: 64 %
PLATELETS: 520 10*3/uL — AB (ref 150–400)
RBC: 2.78 MIL/uL — ABNORMAL LOW (ref 4.22–5.81)
RDW: 16.1 % — AB (ref 11.5–15.5)
WBC: 12.9 10*3/uL — ABNORMAL HIGH (ref 4.0–10.5)

## 2015-05-05 MED ORDER — ENOXAPARIN SODIUM 80 MG/0.8ML ~~LOC~~ SOLN
65.0000 mg | Freq: Two times a day (BID) | SUBCUTANEOUS | Status: DC
  Administered 2015-05-05 – 2015-05-08 (×6): 65 mg via SUBCUTANEOUS
  Filled 2015-05-05 (×6): qty 0.8

## 2015-05-05 MED ORDER — FREE WATER
200.0000 mL | Freq: Three times a day (TID) | Status: DC
  Administered 2015-05-05 – 2015-05-08 (×11): 200 mL

## 2015-05-05 MED ORDER — PIPERACILLIN-TAZOBACTAM 3.375 G IVPB
3.3750 g | Freq: Three times a day (TID) | INTRAVENOUS | Status: DC
  Administered 2015-05-05 – 2015-05-07 (×7): 3.375 g via INTRAVENOUS
  Filled 2015-05-05 (×8): qty 50

## 2015-05-05 NOTE — Progress Notes (Signed)
[place] for Lovenox Indication: DVT  Allergies  Allergen Reactions  . Other Hives    From work uniform  . Shellfish Allergy Swelling    Lip swelling    Patient Measurements: Height: 6' (182.9 cm) Weight: 141 lb 8.6 oz (64.2 kg) IBW/kg (Calculated) : 77.6 Heparin Dosing Weight:   Vital Signs: Temp: 98.5 F (36.9 C) (01/28 1132) Temp Source: Axillary (01/28 1132) BP: 113/66 mmHg (01/28 1132) Pulse Rate: 81 (01/28 1132)  Labs:  Recent Labs  05/03/15 0511 05/04/15 0359 05/05/15 0239  HGB 7.5* 7.7* 7.8*  HCT 23.3* 24.9* 23.7*  PLT 523* 531* 520*  CREATININE 0.49* 0.59* 0.49*    Estimated Creatinine Clearance: 100.3 mL/min (by C-G formula based on Cr of 0.49).   Medical History: History reviewed. No pertinent past medical history.  Medications:  Prescriptions prior to admission  Medication Sig Dispense Refill Last Dose  . hydrocortisone 2.5 % cream Apply 1 application topically 2 (two) times daily as needed (rash/skin irritiation from work clothes).   2 weeks    Assessment: 50 yo male with DVT in bilateral upper extremities, involving the R axillary, R brachial and L brachial veins. Pt is currently on full dose Lovenox. Hemoglobin is low, hgb 7.8, likely due to acute blood loss anemia. No s/sx bleeding at this time. Pt's weight continues to fluctuate, will modify the dose today.  Goal of Therapy:  Anti-Xa level 0.6-1 units/ml 4hrs after LMWH dose given Monitor platelets by anticoagulation protocol: Yes   Plan:  -Reduce Lovenox to 65 mg [place] q12h -Monitor s/sx bleeding -F/u transition to oral anticoagulation as appropriate  Adalene Gulotta, [place] Church 05/05/2015,12:52 PM
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
## 2015-05-06 LAB — CBC
HCT: 24 % — ABNORMAL LOW (ref 39.0–52.0)
HEMOGLOBIN: 7.6 g/dL — AB (ref 13.0–17.0)
MCH: 26.7 pg (ref 26.0–34.0)
MCHC: 31.7 g/dL (ref 30.0–36.0)
MCV: 84.2 fL (ref 78.0–100.0)
Platelets: 533 10*3/uL — ABNORMAL HIGH (ref 150–400)
RBC: 2.85 MIL/uL — ABNORMAL LOW (ref 4.22–5.81)
RDW: 16.1 % — AB (ref 11.5–15.5)
WBC: 13.1 10*3/uL — ABNORMAL HIGH (ref 4.0–10.5)

## 2015-05-06 LAB — CULTURE, BAL-QUANTITATIVE W GRAM STAIN

## 2015-05-06 LAB — CULTURE, BLOOD (ROUTINE X 2)
CULTURE: NO GROWTH
Culture: NO GROWTH

## 2015-05-06 LAB — GLUCOSE, CAPILLARY
Glucose-Capillary: 116 mg/dL — ABNORMAL HIGH (ref 65–99)
Glucose-Capillary: 112 mg/dL — ABNORMAL HIGH (ref 65–99)
Glucose-Capillary: 132 mg/dL — ABNORMAL HIGH (ref 65–99)
Glucose-Capillary: 124 mg/dL — ABNORMAL HIGH (ref 65–99)
Glucose-Capillary: 119 mg/dL — ABNORMAL HIGH (ref 65–99)
Glucose-Capillary: 112 mg/dL — ABNORMAL HIGH (ref 65–99)

## 2015-05-06 LAB — CULTURE, BAL-QUANTITATIVE

## 2015-05-06 MED ORDER — OXYCODONE HCL 5 MG/5ML PO SOLN
5.0000 mg | ORAL | Status: DC | PRN
  Administered 2015-05-06: 10 mg via ORAL
  Administered 2015-05-06: 5 mg via ORAL
  Administered 2015-05-07: 10 mg via ORAL
  Filled 2015-05-06: qty 5
  Filled 2015-05-06 (×2): qty 10
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
## 2015-05-06 NOTE — Progress Notes (Addendum)
150 ml of Fentanyl gtt (expired) wasted in sink. Witnessed by Jamelle Haring, RN

## 2015-05-07 LAB — CBC
HEMATOCRIT: 23.9 % — AB (ref 39.0–52.0)
Hemoglobin: 7.5 g/dL — ABNORMAL LOW (ref 13.0–17.0)
MCH: 26.7 pg (ref 26.0–34.0)
MCHC: 31.4 g/dL (ref 30.0–36.0)
MCV: 85.1 fL (ref 78.0–100.0)
Platelets: 548 10*3/uL — ABNORMAL HIGH (ref 150–400)
RBC: 2.81 MIL/uL — ABNORMAL LOW (ref 4.22–5.81)
RDW: 16.1 % — AB (ref 11.5–15.5)
WBC: 12.5 10*3/uL — ABNORMAL HIGH (ref 4.0–10.5)

## 2015-05-07 LAB — BASIC METABOLIC PANEL
Anion gap: 8 (ref 5–15)
BUN: 24 mg/dL — ABNORMAL HIGH (ref 6–20)
CALCIUM: 8.9 mg/dL (ref 8.9–10.3)
CHLORIDE: 101 mmol/L (ref 101–111)
CO2: 30 mmol/L (ref 22–32)
CREATININE: 0.6 mg/dL — AB (ref 0.61–1.24)
GFR calc Af Amer: >60 mL/min (ref >60)
GFR calc non Af Amer: >60 mL/min (ref >60)
GLUCOSE: 124 mg/dL — AB (ref 65–99)
Potassium: 4.3 mmol/L (ref 3.5–5.1)
Sodium: 139 mmol/L (ref 135–145)

## 2015-05-07 LAB — GLUCOSE, CAPILLARY
GLUCOSE-CAPILLARY: 114 mg/dL — AB (ref 65–99)
GLUCOSE-CAPILLARY: 125 mg/dL — AB (ref 65–99)
Glucose-Capillary: 111 mg/dL — ABNORMAL HIGH (ref 65–99)
Glucose-Capillary: 113 mg/dL — ABNORMAL HIGH (ref 65–99)
Glucose-Capillary: 126 mg/dL — ABNORMAL HIGH (ref 65–99)

## 2015-05-07 MED ORDER — DEXTROSE 5 % IV SOLN
2.0000 g | INTRAVENOUS | Status: DC
  Administered 2015-05-07 – 2015-05-08 (×2): 2 g via INTRAVENOUS
  Filled 2015-05-07 (×2): qty 2

## 2015-05-07 MED ORDER — OXYCODONE HCL 5 MG/5ML PO SOLN
5.0000 mg | ORAL | Status: DC | PRN
  Administered 2015-05-07 – 2015-05-08 (×2): 10 mg
  Filled 2015-05-07 (×2): qty 10
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
## 2015-05-07 NOTE — Progress Notes (Signed)
SLP Cancellation Note  Patient Details Name: [name] MRN: [id number] DOB: [birth year]   Cancelled treatment:       Reason Eval/Treat Not Completed: Medical issues which prohibited therapy (Patient with c/o nausea)  [name] Rainwater MA, CCC-SLP [phone]  Bowdy Bair Meryl 05/07/2015, 1:50 PM
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
## 2015-05-07 NOTE — Progress Notes (Signed)
Spoke with [name] in detail with cuff deflated.  He specifically indicated that he would be the primary decision maker about his care.  If her is not able to make decision, his sister [name]) would be the next person to make decisions.   His daughter and son can participate if the sister is unable to do so for some reason.  His ex-wife is not to make decisions for the patient and he does not want to share any medical information with her.  [name]. Dahlia Bailiff, MD, [place] [phone] Trauma Surgeon

## 2015-05-07 NOTE — Progress Notes (Signed)
PT Cancellation Note  Patient Details Name: [name] MRN: [id number] DOB: Aug 30, 1965   Cancelled Treatment:    Reason Eval/Treat Not Completed: Patient not medically ready.  Pt indicates feeling nauseated and not up for PT at this time.  Will f/u another time.     Litzy Dicker, Thornton Papas 05/07/2015, 1:48 PM
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
## 2015-05-08 ENCOUNTER — Inpatient Hospital Stay
Admission: RE | Admit: 2015-05-08 | Discharge: 2015-07-04 | Disposition: A | Discharge status: Skilled Nursing Facility | Payer: BLUE CROSS/BLUE SHIELD | Source: Ambulatory Visit | Attending: Internal Medicine | Admitting: Internal Medicine

## 2015-05-08 ENCOUNTER — Other Ambulatory Visit (HOSPITAL_COMMUNITY): Payer: BLUE CROSS/BLUE SHIELD

## 2015-05-08 DIAGNOSIS — R52 Pain, unspecified: Secondary | ICD-10-CM

## 2015-05-08 DIAGNOSIS — E8770 Fluid overload, unspecified: Secondary | ICD-10-CM

## 2015-05-08 DIAGNOSIS — J9690 Respiratory failure, unspecified, unspecified whether with hypoxia or hypercapnia: Secondary | ICD-10-CM | POA: Insufficient documentation

## 2015-05-08 DIAGNOSIS — R0902 Hypoxemia: Secondary | ICD-10-CM

## 2015-05-08 DIAGNOSIS — Z931 Gastrostomy status: Secondary | ICD-10-CM | POA: Insufficient documentation

## 2015-05-08 DIAGNOSIS — I999 Unspecified disorder of circulatory system: Secondary | ICD-10-CM

## 2015-05-08 DIAGNOSIS — J9600 Acute respiratory failure, unspecified whether with hypoxia or hypercapnia: Secondary | ICD-10-CM | POA: Insufficient documentation

## 2015-05-08 DIAGNOSIS — J9610 Chronic respiratory failure, unspecified whether with hypoxia or hypercapnia: Secondary | ICD-10-CM

## 2015-05-08 DIAGNOSIS — L0291 Cutaneous abscess, unspecified: Secondary | ICD-10-CM

## 2015-05-08 DIAGNOSIS — Z95828 Presence of other vascular implants and grafts: Secondary | ICD-10-CM

## 2015-05-08 DIAGNOSIS — R140 Abdominal distension (gaseous): Secondary | ICD-10-CM

## 2015-05-08 DIAGNOSIS — J189 Pneumonia, unspecified organism: Secondary | ICD-10-CM

## 2015-05-08 LAB — CBC
HEMATOCRIT: 26.1 % — AB (ref 39.0–52.0)
HEMOGLOBIN: 8.3 g/dL — AB (ref 13.0–17.0)
MCH: 27 pg (ref 26.0–34.0)
MCHC: 31.8 g/dL (ref 30.0–36.0)
MCV: 85 fL (ref 78.0–100.0)
Platelets: 574 10*3/uL — ABNORMAL HIGH (ref 150–400)
RBC: 3.07 MIL/uL — ABNORMAL LOW (ref 4.22–5.81)
RDW: 16.4 % — ABNORMAL HIGH (ref 11.5–15.5)
WBC: 9.3 10*3/uL (ref 4.0–10.5)

## 2015-05-08 LAB — GLUCOSE, CAPILLARY
GLUCOSE-CAPILLARY: 100 mg/dL — AB (ref 65–99)
GLUCOSE-CAPILLARY: 115 mg/dL — AB (ref 65–99)
GLUCOSE-CAPILLARY: 115 mg/dL — AB (ref 65–99)
GLUCOSE-CAPILLARY: 94 mg/dL (ref 65–99)
Glucose-Capillary: 92 mg/dL (ref 65–99)
Glucose-Capillary: 91 mg/dL (ref 65–99)
Glucose-Capillary: 102 mg/dL — ABNORMAL HIGH (ref 65–99)

## 2015-05-08 LAB — BASIC METABOLIC PANEL
Anion gap: 13 (ref 5–15)
BUN: 25 mg/dL — AB (ref 6–20)
CHLORIDE: 96 mmol/L — AB (ref 101–111)
CO2: 28 mmol/L (ref 22–32)
CREATININE: 0.56 mg/dL — AB (ref 0.61–1.24)
Calcium: 9.3 mg/dL (ref 8.9–10.3)
GFR calc Af Amer: >60 mL/min (ref >60)
GFR calc non Af Amer: >60 mL/min (ref >60)
GLUCOSE: 113 mg/dL — AB (ref 65–99)
POTASSIUM: 4.5 mmol/L (ref 3.5–5.1)
SODIUM: 137 mmol/L (ref 135–145)

## 2015-05-08 MED ORDER — POLYVINYL ALCOHOL 1.4 % OP SOLN: 1.0000 [drp] | OPHTHALMIC | Status: DC | PRN

## 2015-05-08 MED ORDER — GLYCOPYRROLATE 1 MG PO TABS: 4.0000 mg | ORAL_TABLET | Freq: Two times a day (BID) | ORAL | Status: DC

## 2015-05-08 MED ORDER — LORAZEPAM 2 MG/ML IJ SOLN: 1.0000 mg | INTRAMUSCULAR | Status: DC | PRN

## 2015-05-08 MED ORDER — PANTOPRAZOLE SODIUM 40 MG PO PACK: 40.0000 mg | PACK | ORAL | Status: DC

## 2015-05-08 MED ORDER — FENTANYL BOLUS VIA INFUSION: 50.0000 ug | INTRAVENOUS | Status: DC | PRN

## 2015-05-08 MED ORDER — DEXTROSE 5 % IV SOLN: 2.0000 g | INTRAVENOUS | Status: DC

## 2015-05-08 MED ORDER — ONDANSETRON HCL 4 MG/2ML IJ SOLN: 4.0000 mg | Freq: Four times a day (QID) | INTRAMUSCULAR | Status: DC | PRN

## 2015-05-08 MED ORDER — FREE WATER: 200.0000 mL | Freq: Three times a day (TID) | Status: DC

## 2015-05-08 MED ORDER — OXYCODONE HCL 5 MG/5ML PO SOLN: 5.0000 mg | ORAL | Status: DC | PRN

## 2015-05-08 MED ORDER — CLONAZEPAM 1 MG PO TABS: 1.0000 mg | ORAL_TABLET | Freq: Two times a day (BID) | ORAL | Status: DC

## 2015-05-08 MED ORDER — FENTANYL 100 MCG/HR TD PT72: 100.0000 ug | MEDICATED_PATCH | TRANSDERMAL | Status: DC

## 2015-05-08 MED ORDER — PIVOT 1.5 CAL PO LIQD: 1000.0000 mL | ORAL | Status: DC

## 2015-05-08 MED ORDER — ENOXAPARIN SODIUM 80 MG/0.8ML ~~LOC~~ SOLN: 65.0000 mg | Freq: Two times a day (BID) | SUBCUTANEOUS | Status: DC

## 2015-05-08 NOTE — Progress Notes (Signed)
   05/08/15 1243  Clinical Encounter Type  Visited With Patient;Health care provider  Visit Type Follow-up;Critical Care  Referral From [place] responded to a request from the nurse to assist with the completion of an advanced directive. Chaplain participated and helped facilitate the completion of HCPOA. Chaplain also placed a copy in the patient's chart and gave the patient back his original copy. Chaplain support available as needed.   Jeri Lager, Chaplain 05/08/2015 12:45 PM

## 2015-05-08 NOTE — Progress Notes (Signed)
Notified by MD that pt has been approved for [facility] transfer by insurance after Peer to Peer.  Pt has been notified, and he is agreeable to this plan.  Spoke with Gaspar Bidding, admissions liaison for [facility], and he states bed is available today.  Left message for pt's sister Lanelle Bal.  Spoke with pt's daughter, [name], and informed of [facility] approval.  She states she will notify her aunt, who is likely sleeping, as she works third shift.  Will notify family of room number on [facility] when available.    Reinaldo Raddle, RN, BSN  Trauma/Neuro ICU Case Manager [phone]
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
## 2015-05-08 NOTE — Progress Notes (Signed)
Met with multiple members of patient's family to discuss dc planning.  Pt's daughter, son, sister and estranged wife in attendance.  Discussed possibility of home discharge on ventilator--explained that there would need to be two designated caregivers who would have to undergo hospital training of vent for two days, then overnight training.  Home vent would require approval from insurance company obtained by DME agency.   Family has expressed interest in [facility] for nursing care...rep from [facility] stated that [facility] would approve 12 hrs for one week, then 8hrs for one week, 4hrs x one week, then none.  Expressed importance to family in applying for Medicaid now, as pt would be able to get more nursing hours in the home.  They state they will do this ASAP.   Family are still interested in [facility] [facility] for vent weaning.   Pt has been able to wean over the last 2 days without cardiac episode.  Would recommend Peer to Peer by attending MD.  Family in favor of this.   Notified Dr. Grandville Silos, and he plans to do peer to peer this afternoon with insurance company [employer].   Will follow up with family with results of P2P.  If LTAC not an option, will start home vent process, per pt/family wishes.    Reinaldo Raddle, RN, BSN  Trauma/Neuro ICU Case Manager [phone]

## 2015-05-08 NOTE — Progress Notes (Signed)
Patient ID: [name], male   DOB: [birth year], 50 y.o.   MRN: [id number] Neuro stable. Continue care as per trauma

## 2015-05-08 NOTE — Progress Notes (Signed)
[facility] for Lovenox Indication: DVT  Allergies  Allergen Reactions  . Other Hives    From work uniform  . Shellfish Allergy Swelling    Lip swelling    Patient Measurements: Height: 6' (182.9 cm) Weight: 138 lb 14.2 oz (63 kg) IBW/kg (Calculated) : 77.6  Vital Signs: Temp: 99.8 F (37.7 C) (01/31 0400) Temp Source: Oral (01/31 0400) BP: 106/65 mmHg (01/31 0800) Pulse Rate: 88 (01/31 0800)  Labs:  Recent Labs  05/06/15 0605 05/07/15 0230 05/08/15 0318  HGB 7.6* 7.5* 8.3*  HCT 24.0* 23.9* 26.1*  PLT 533* 548* 574*  CREATININE  --  0.60* 0.56*    Estimated Creatinine Clearance: 98.4 mL/min (by C-G formula based on Cr of 0.56).  Assessment: 50 yo male with DVT in bilateral upper extremities, involving the R axillary, R brachial and L brachial veins. Pt is currently on full dose Lovenox. Hemoglobin is low at 8.3 but stable. No bleeding noted. Dose remains appropriate.  Goal of Therapy:  Anti-Xa level 0.6-1 units/ml 4hrs after LMWH dose given Monitor platelets by anticoagulation protocol: Yes   Plan:  - Continue lovenox 65mg  SQ Q12H - F/u CBC and S&S of bleeding - F/u plans for oral anticoagulation  Salome Arnt, PharmD, BCPS Pager # [phone] 05/08/2015 8:34 AM
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
## 2015-05-08 NOTE — Progress Notes (Signed)
Patient ID: ANEES MASTEN, male   DOB: 1966-02-01, 50 y.o.   MRN: [id number] I did peer to peer with [facility] physician and they have approved [facility]. I told [place] and I tried to call his sister but her VM is full. Georganna Skeans, MD, MPH, FACS Trauma: [phone] General Surgery: [phone]
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
## 2015-05-09 ENCOUNTER — Other Ambulatory Visit (HOSPITAL_COMMUNITY): Payer: BLUE CROSS/BLUE SHIELD

## 2015-05-09 LAB — BLOOD GAS, ARTERIAL
ACID-BASE EXCESS: 4.2 mmol/L — AB (ref 0.0–2.0)
Bicarbonate: 28.2 mEq/L — ABNORMAL HIGH (ref 20.0–24.0)
FIO2: 0.4
LHR: 14 {breaths}/min
MECHVT: 600 mL
O2 SAT: 92.2 %
PEEP/CPAP: 5 cmH2O
PH ART: 7.453 — AB (ref 7.350–7.450)
Patient temperature: 97.6
TCO2: 29.4 mmol/L (ref 0–100)
pCO2 arterial: 40.6 mmHg (ref 35.0–45.0)
pO2, Arterial: 63.9 mmHg — ABNORMAL LOW (ref 80.0–100.0)

## 2015-05-09 LAB — CBC
HEMATOCRIT: 27.4 % — AB (ref 39.0–52.0)
HEMOGLOBIN: 8.9 g/dL — AB (ref 13.0–17.0)
MCH: 27.6 pg (ref 26.0–34.0)
MCHC: 32.5 g/dL (ref 30.0–36.0)
MCV: 85.1 fL (ref 78.0–100.0)
PLATELETS: 637 10*3/uL — AB (ref 150–400)
RBC: 3.22 MIL/uL — AB (ref 4.22–5.81)
RDW: 16.1 % — AB (ref 11.5–15.5)
WBC: 11.1 10*3/uL — ABNORMAL HIGH (ref 4.0–10.5)

## 2015-05-09 LAB — COMPREHENSIVE METABOLIC PANEL
ALBUMIN: 1.7 g/dL — AB (ref 3.5–5.0)
ALT: 333 U/L — ABNORMAL HIGH (ref 17–63)
ANION GAP: 9 (ref 5–15)
AST: 205 U/L — AB (ref 15–41)
Alkaline Phosphatase: 213 U/L — ABNORMAL HIGH (ref 38–126)
BUN: 29 mg/dL — AB (ref 6–20)
CHLORIDE: 99 mmol/L — AB (ref 101–111)
CO2: 27 mmol/L (ref 22–32)
Calcium: 9.9 mg/dL (ref 8.9–10.3)
Creatinine, Ser: 0.54 mg/dL — ABNORMAL LOW (ref 0.61–1.24)
GFR calc Af Amer: >60 mL/min (ref >60)
GFR calc non Af Amer: >60 mL/min (ref >60)
GLUCOSE: 117 mg/dL — AB (ref 65–99)
POTASSIUM: 4.6 mmol/L (ref 3.5–5.1)
Sodium: 135 mmol/L (ref 135–145)
Total Bilirubin: 0.4 mg/dL (ref 0.3–1.2)
Total Protein: 8.2 g/dL — ABNORMAL HIGH (ref 6.5–8.1)

## 2015-05-10 DIAGNOSIS — J9600 Acute respiratory failure, unspecified whether with hypoxia or hypercapnia: Secondary | ICD-10-CM | POA: Insufficient documentation

## 2015-05-10 DIAGNOSIS — Z931 Gastrostomy status: Secondary | ICD-10-CM | POA: Insufficient documentation
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
## 2015-05-12 ENCOUNTER — Other Ambulatory Visit (HOSPITAL_COMMUNITY): Payer: BLUE CROSS/BLUE SHIELD

## 2015-05-12 LAB — BASIC METABOLIC PANEL
Anion gap: 10 (ref 5–15)
BUN: 19 mg/dL (ref 6–20)
CHLORIDE: 95 mmol/L — AB (ref 101–111)
CO2: 27 mmol/L (ref 22–32)
CREATININE: 0.32 mg/dL — AB (ref 0.61–1.24)
Calcium: 9.6 mg/dL (ref 8.9–10.3)
GFR calc non Af Amer: >60 mL/min (ref >60)
Glucose, Bld: 118 mg/dL — ABNORMAL HIGH (ref 65–99)
Potassium: 4.1 mmol/L (ref 3.5–5.1)
SODIUM: 132 mmol/L — AB (ref 135–145)

## 2015-05-12 LAB — CBC
HEMATOCRIT: 24.7 % — AB (ref 39.0–52.0)
HEMOGLOBIN: 8.2 g/dL — AB (ref 13.0–17.0)
MCH: 27.8 pg (ref 26.0–34.0)
MCHC: 33.2 g/dL (ref 30.0–36.0)
MCV: 83.7 fL (ref 78.0–100.0)
Platelets: 548 10*3/uL — ABNORMAL HIGH (ref 150–400)
RBC: 2.95 MIL/uL — AB (ref 4.22–5.81)
RDW: 16.5 % — ABNORMAL HIGH (ref 11.5–15.5)
WBC: 13.1 10*3/uL — ABNORMAL HIGH (ref 4.0–10.5)

## 2015-05-13 LAB — CBC
HEMATOCRIT: 24.6 % — AB (ref 39.0–52.0)
HEMOGLOBIN: 8.1 g/dL — AB (ref 13.0–17.0)
MCH: 27.5 pg (ref 26.0–34.0)
MCHC: 32.9 g/dL (ref 30.0–36.0)
MCV: 83.4 fL (ref 78.0–100.0)
Platelets: 541 10*3/uL — ABNORMAL HIGH (ref 150–400)
RBC: 2.95 MIL/uL — ABNORMAL LOW (ref 4.22–5.81)
RDW: 16.7 % — ABNORMAL HIGH (ref 11.5–15.5)
WBC: 13.2 10*3/uL — ABNORMAL HIGH (ref 4.0–10.5)

## 2015-05-13 LAB — BASIC METABOLIC PANEL
Anion gap: 10 (ref 5–15)
BUN: 19 mg/dL (ref 6–20)
CALCIUM: 9.2 mg/dL (ref 8.9–10.3)
CHLORIDE: 96 mmol/L — AB (ref 101–111)
CO2: 27 mmol/L (ref 22–32)
CREATININE: 0.33 mg/dL — AB (ref 0.61–1.24)
GFR calc non Af Amer: >60 mL/min (ref >60)
GLUCOSE: 115 mg/dL — AB (ref 65–99)
Potassium: 4.5 mmol/L (ref 3.5–5.1)
Sodium: 133 mmol/L — ABNORMAL LOW (ref 135–145)

## 2015-05-15 ENCOUNTER — Other Ambulatory Visit (HOSPITAL_COMMUNITY): Payer: BLUE CROSS/BLUE SHIELD

## 2015-05-15 DIAGNOSIS — J156 Pneumonia due to other aerobic Gram-negative bacteria: Secondary | ICD-10-CM

## 2015-05-15 DIAGNOSIS — J9601 Acute respiratory failure with hypoxia: Secondary | ICD-10-CM

## 2015-05-15 DIAGNOSIS — Z930 Tracheostomy status: Secondary | ICD-10-CM
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
## 2015-05-17 ENCOUNTER — Other Ambulatory Visit (HOSPITAL_COMMUNITY): Payer: BLUE CROSS/BLUE SHIELD

## 2015-05-17 DIAGNOSIS — J189 Pneumonia, unspecified organism: Secondary | ICD-10-CM

## 2015-05-17 LAB — BASIC METABOLIC PANEL
ANION GAP: 12 (ref 5–15)
BUN: 24 mg/dL — ABNORMAL HIGH (ref 6–20)
CALCIUM: 10.2 mg/dL (ref 8.9–10.3)
CHLORIDE: 95 mmol/L — AB (ref 101–111)
CO2: 28 mmol/L (ref 22–32)
Creatinine, Ser: 0.38 mg/dL — ABNORMAL LOW (ref 0.61–1.24)
GFR calc non Af Amer: >60 mL/min (ref >60)
GLUCOSE: 123 mg/dL — AB (ref 65–99)
POTASSIUM: 4.3 mmol/L (ref 3.5–5.1)
Sodium: 135 mmol/L (ref 135–145)

## 2015-05-17 LAB — CBC
HEMATOCRIT: 28.8 % — AB (ref 39.0–52.0)
HEMOGLOBIN: 9.4 g/dL — AB (ref 13.0–17.0)
MCH: 26.7 pg (ref 26.0–34.0)
MCHC: 32.6 g/dL (ref 30.0–36.0)
MCV: 81.8 fL (ref 78.0–100.0)
Platelets: 569 10*3/uL — ABNORMAL HIGH (ref 150–400)
RBC: 3.52 MIL/uL — AB (ref 4.22–5.81)
RDW: 16.5 % — AB (ref 11.5–15.5)
WBC: 14.9 10*3/uL — AB (ref 4.0–10.5)

## 2015-05-17 LAB — PROCALCITONIN: Procalcitonin: 0.1 ng/mL

## 2015-05-17 LAB — SEDIMENTATION RATE: SED RATE: 130 mm/h — AB (ref 0–16)
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
## 2015-05-19 LAB — BASIC METABOLIC PANEL
ANION GAP: 12 (ref 5–15)
BUN: 24 mg/dL — AB (ref 6–20)
CHLORIDE: 93 mmol/L — AB (ref 101–111)
CO2: 28 mmol/L (ref 22–32)
Calcium: 10.6 mg/dL — ABNORMAL HIGH (ref 8.9–10.3)
Creatinine, Ser: 0.34 mg/dL — ABNORMAL LOW (ref 0.61–1.24)
GFR calc Af Amer: >60 mL/min (ref >60)
GFR calc non Af Amer: >60 mL/min (ref >60)
GLUCOSE: 107 mg/dL — AB (ref 65–99)
POTASSIUM: 4.3 mmol/L (ref 3.5–5.1)
SODIUM: 133 mmol/L — AB (ref 135–145)

## 2015-05-19 LAB — CBC
HCT: 30.5 % — ABNORMAL LOW (ref 39.0–52.0)
HEMOGLOBIN: 9.8 g/dL — AB (ref 13.0–17.0)
MCH: 26.4 pg (ref 26.0–34.0)
MCHC: 32.1 g/dL (ref 30.0–36.0)
MCV: 82.2 fL (ref 78.0–100.0)
Platelets: 514 10*3/uL — ABNORMAL HIGH (ref 150–400)
RBC: 3.71 MIL/uL — AB (ref 4.22–5.81)
RDW: 16.4 % — ABNORMAL HIGH (ref 11.5–15.5)
WBC: 8.9 10*3/uL (ref 4.0–10.5)

## 2015-05-21 ENCOUNTER — Other Ambulatory Visit (HOSPITAL_COMMUNITY): Payer: BLUE CROSS/BLUE SHIELD

## 2015-05-21 DIAGNOSIS — J9690 Respiratory failure, unspecified, unspecified whether with hypoxia or hypercapnia: Secondary | ICD-10-CM | POA: Insufficient documentation

## 2015-05-21 DIAGNOSIS — J9610 Chronic respiratory failure, unspecified whether with hypoxia or hypercapnia: Secondary | ICD-10-CM

## 2015-05-21 DIAGNOSIS — J9620 Acute and chronic respiratory failure, unspecified whether with hypoxia or hypercapnia: Secondary | ICD-10-CM

## 2015-05-21 LAB — BLOOD GAS, ARTERIAL
ACID-BASE EXCESS: 6.5 mmol/L — AB (ref 0.0–2.0)
BICARBONATE: 31 meq/L — AB (ref 20.0–24.0)
FIO2: 40
O2 Content: 20 L/min
O2 SAT: 95.2 %
PATIENT TEMPERATURE: 98.4
PCO2 ART: 48.5 mmHg — AB (ref 35.0–45.0)
PH ART: 7.421 (ref 7.350–7.450)
PO2 ART: 80.8 mmHg (ref 80.0–100.0)
TCO2: 32.5 mmol/L (ref 0–100)
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
## 2015-05-22 LAB — CBC
HEMATOCRIT: 33.8 % — AB (ref 39.0–52.0)
Hemoglobin: 10.7 g/dL — ABNORMAL LOW (ref 13.0–17.0)
MCH: 26.4 pg (ref 26.0–34.0)
MCHC: 31.7 g/dL (ref 30.0–36.0)
MCV: 83.3 fL (ref 78.0–100.0)
Platelets: 496 10*3/uL — ABNORMAL HIGH (ref 150–400)
RBC: 4.06 MIL/uL — ABNORMAL LOW (ref 4.22–5.81)
RDW: 16.7 % — AB (ref 11.5–15.5)
WBC: 10.4 10*3/uL (ref 4.0–10.5)

## 2015-05-22 LAB — BASIC METABOLIC PANEL
ANION GAP: 14 (ref 5–15)
BUN: 28 mg/dL — ABNORMAL HIGH (ref 6–20)
CALCIUM: 11.8 mg/dL — AB (ref 8.9–10.3)
CO2: 30 mmol/L (ref 22–32)
Chloride: 97 mmol/L — ABNORMAL LOW (ref 101–111)
Creatinine, Ser: 0.37 mg/dL — ABNORMAL LOW (ref 0.61–1.24)
GFR calc Af Amer: >60 mL/min (ref >60)
GFR calc non Af Amer: >60 mL/min (ref >60)
GLUCOSE: 123 mg/dL — AB (ref 65–99)
Potassium: 4.1 mmol/L (ref 3.5–5.1)
Sodium: 141 mmol/L (ref 135–145)

## 2015-05-23 LAB — CULTURE, BLOOD (ROUTINE X 2)
Culture: NO GROWTH
Culture: NO GROWTH
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
## 2015-05-24 ENCOUNTER — Other Ambulatory Visit (HOSPITAL_COMMUNITY): Payer: BLUE CROSS/BLUE SHIELD
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
## 2015-05-25 LAB — CULTURE, RESPIRATORY

## 2015-05-25 LAB — CULTURE, RESPIRATORY W GRAM STAIN

## 2015-05-27 ENCOUNTER — Other Ambulatory Visit (HOSPITAL_COMMUNITY): Payer: BLUE CROSS/BLUE SHIELD

## 2015-05-27 LAB — BASIC METABOLIC PANEL
Anion gap: 11 (ref 5–15)
BUN: 19 mg/dL (ref 6–20)
CALCIUM: 10.5 mg/dL — AB (ref 8.9–10.3)
CO2: 31 mmol/L (ref 22–32)
Chloride: 98 mmol/L — ABNORMAL LOW (ref 101–111)
Glucose, Bld: 102 mg/dL — ABNORMAL HIGH (ref 65–99)
Potassium: 4.1 mmol/L (ref 3.5–5.1)
SODIUM: 140 mmol/L (ref 135–145)

## 2015-05-27 LAB — CBC
HCT: 28.8 % — ABNORMAL LOW (ref 39.0–52.0)
Hemoglobin: 9 g/dL — ABNORMAL LOW (ref 13.0–17.0)
MCH: 26.1 pg (ref 26.0–34.0)
MCHC: 31.3 g/dL (ref 30.0–36.0)
MCV: 83.5 fL (ref 78.0–100.0)
PLATELETS: 381 10*3/uL (ref 150–400)
RBC: 3.45 MIL/uL — ABNORMAL LOW (ref 4.22–5.81)
RDW: 16.7 % — AB (ref 11.5–15.5)
WBC: 10.2 10*3/uL (ref 4.0–10.5)

## 2015-05-28 DIAGNOSIS — J9610 Chronic respiratory failure, unspecified whether with hypoxia or hypercapnia: Secondary | ICD-10-CM

## 2015-05-28 LAB — FERRITIN: FERRITIN: 965 ng/mL — AB (ref 24–336)
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
## 2015-05-29 ENCOUNTER — Other Ambulatory Visit (HOSPITAL_COMMUNITY): Payer: BLUE CROSS/BLUE SHIELD

## 2015-05-30 LAB — BASIC METABOLIC PANEL
ANION GAP: 10 (ref 5–15)
BUN: 18 mg/dL (ref 6–20)
CHLORIDE: 94 mmol/L — AB (ref 101–111)
CO2: 30 mmol/L (ref 22–32)
Calcium: 9.9 mg/dL (ref 8.9–10.3)
Creatinine, Ser: <0.3 mg/dL — ABNORMAL LOW (ref 0.61–1.24)
Glucose, Bld: 149 mg/dL — ABNORMAL HIGH (ref 65–99)
POTASSIUM: 3.4 mmol/L — AB (ref 3.5–5.1)
SODIUM: 134 mmol/L — AB (ref 135–145)

## 2015-05-30 LAB — URINE MICROSCOPIC-ADD ON

## 2015-05-30 LAB — CBC
HCT: 29.1 % — ABNORMAL LOW (ref 39.0–52.0)
HEMOGLOBIN: 9.3 g/dL — AB (ref 13.0–17.0)
MCH: 25.5 pg — AB (ref 26.0–34.0)
MCHC: 32 g/dL (ref 30.0–36.0)
MCV: 79.9 fL (ref 78.0–100.0)
Platelets: 405 10*3/uL — ABNORMAL HIGH (ref 150–400)
RBC: 3.64 MIL/uL — AB (ref 4.22–5.81)
RDW: 16 % — ABNORMAL HIGH (ref 11.5–15.5)
WBC: 8.6 10*3/uL (ref 4.0–10.5)

## 2015-05-30 LAB — URINALYSIS, ROUTINE W REFLEX MICROSCOPIC
Bilirubin Urine: NEGATIVE
Glucose, UA: NEGATIVE mg/dL
Ketones, ur: NEGATIVE mg/dL
LEUKOCYTES UA: NEGATIVE
NITRITE: NEGATIVE
PROTEIN: NEGATIVE mg/dL
SPECIFIC GRAVITY, URINE: 1.022 (ref 1.005–1.030)
pH: 6 (ref 5.0–8.0)

## 2015-05-31 ENCOUNTER — Inpatient Hospital Stay (HOSPITAL_COMMUNITY)
Admission: RE | Admit: 2015-05-31 | Payer: Self-pay | Source: Intra-hospital | Admitting: Physical Medicine & Rehabilitation

## 2015-05-31 LAB — URINE CULTURE: Culture: NO GROWTH

## 2015-05-31 NOTE — Progress Notes (Signed)
Admission on 05/31/15 held due to increasing secretions with suctioning needs beyond that which can be provided on inpatient rehab.
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
## 2015-06-01 ENCOUNTER — Inpatient Hospital Stay (HOSPITAL_COMMUNITY): Payer: Self-pay | Admitting: Occupational Therapy

## 2015-06-01 ENCOUNTER — Inpatient Hospital Stay (HOSPITAL_COMMUNITY): Payer: Self-pay | Admitting: Speech Pathology

## 2015-06-01 ENCOUNTER — Inpatient Hospital Stay (HOSPITAL_COMMUNITY): Payer: Self-pay | Admitting: Physical Therapy

## 2015-06-01 ENCOUNTER — Other Ambulatory Visit (HOSPITAL_COMMUNITY): Payer: BLUE CROSS/BLUE SHIELD

## 2015-06-01 LAB — CULTURE, RESPIRATORY

## 2015-06-01 LAB — CULTURE, RESPIRATORY W GRAM STAIN

## 2015-06-01 LAB — BRAIN NATRIURETIC PEPTIDE: B NATRIURETIC PEPTIDE 5: 63 pg/mL (ref 0.0–100.0)

## 2015-06-03 ENCOUNTER — Other Ambulatory Visit (HOSPITAL_COMMUNITY): Payer: BLUE CROSS/BLUE SHIELD

## 2015-06-03 LAB — CBC
HCT: 27.6 % — ABNORMAL LOW (ref 39.0–52.0)
HEMOGLOBIN: 8.5 g/dL — AB (ref 13.0–17.0)
MCH: 24.8 pg — AB (ref 26.0–34.0)
MCHC: 30.8 g/dL (ref 30.0–36.0)
MCV: 80.5 fL (ref 78.0–100.0)
Platelets: 518 10*3/uL — ABNORMAL HIGH (ref 150–400)
RBC: 3.43 MIL/uL — AB (ref 4.22–5.81)
RDW: 16.8 % — ABNORMAL HIGH (ref 11.5–15.5)
WBC: 11.3 10*3/uL — ABNORMAL HIGH (ref 4.0–10.5)

## 2015-06-03 LAB — BASIC METABOLIC PANEL
ANION GAP: 9 (ref 5–15)
BUN: 14 mg/dL (ref 6–20)
CHLORIDE: 93 mmol/L — AB (ref 101–111)
CO2: 33 mmol/L — ABNORMAL HIGH (ref 22–32)
Calcium: 9.8 mg/dL (ref 8.9–10.3)
Creatinine, Ser: <0.3 mg/dL — ABNORMAL LOW (ref 0.61–1.24)
Glucose, Bld: 125 mg/dL — ABNORMAL HIGH (ref 65–99)
POTASSIUM: 3.8 mmol/L (ref 3.5–5.1)
SODIUM: 135 mmol/L (ref 135–145)

## 2015-06-03 LAB — CULTURE, BLOOD (ROUTINE X 2)
Culture: NO GROWTH
Culture: NO GROWTH

## 2015-06-04 ENCOUNTER — Other Ambulatory Visit (HOSPITAL_COMMUNITY): Payer: BLUE CROSS/BLUE SHIELD

## 2015-06-04 LAB — VANCOMYCIN, TROUGH: Vancomycin Tr: 7 ug/mL — ABNORMAL LOW (ref 10.0–20.0)
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
## 2015-06-05 ENCOUNTER — Other Ambulatory Visit (HOSPITAL_COMMUNITY): Payer: BLUE CROSS/BLUE SHIELD

## 2015-06-06 ENCOUNTER — Other Ambulatory Visit (HOSPITAL_COMMUNITY): Payer: BLUE CROSS/BLUE SHIELD

## 2015-06-07 DIAGNOSIS — R5381 Other malaise: Secondary | ICD-10-CM

## 2015-06-07 LAB — VANCOMYCIN, TROUGH: VANCOMYCIN TR: 11 ug/mL (ref 10.0–20.0)
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
## 2015-06-09 ENCOUNTER — Other Ambulatory Visit (HOSPITAL_COMMUNITY): Payer: BLUE CROSS/BLUE SHIELD

## 2015-06-09 LAB — CBC
HCT: 25.4 % — ABNORMAL LOW (ref 39.0–52.0)
Hemoglobin: 8.2 g/dL — ABNORMAL LOW (ref 13.0–17.0)
MCH: 26 pg (ref 26.0–34.0)
MCHC: 32.3 g/dL (ref 30.0–36.0)
MCV: 80.6 fL (ref 78.0–100.0)
Platelets: 534 10*3/uL — ABNORMAL HIGH (ref 150–400)
RBC: 3.15 MIL/uL — ABNORMAL LOW (ref 4.22–5.81)
RDW: 17.6 % — ABNORMAL HIGH (ref 11.5–15.5)
WBC: 8.5 10*3/uL (ref 4.0–10.5)

## 2015-06-09 LAB — BASIC METABOLIC PANEL
Anion gap: 11 (ref 5–15)
BUN: 11 mg/dL (ref 6–20)
CO2: 28 mmol/L (ref 22–32)
Calcium: 9.8 mg/dL (ref 8.9–10.3)
Chloride: 97 mmol/L — ABNORMAL LOW (ref 101–111)
Creatinine, Ser: <0.3 mg/dL — ABNORMAL LOW (ref 0.61–1.24)
Glucose, Bld: 97 mg/dL (ref 65–99)
Potassium: 4.1 mmol/L (ref 3.5–5.1)
Sodium: 136 mmol/L (ref 135–145)

## 2015-06-10 LAB — URINALYSIS, ROUTINE W REFLEX MICROSCOPIC
BILIRUBIN URINE: NEGATIVE
GLUCOSE, UA: NEGATIVE mg/dL
KETONES UR: NEGATIVE mg/dL
Leukocytes, UA: NEGATIVE
Nitrite: NEGATIVE
PROTEIN: NEGATIVE mg/dL
Specific Gravity, Urine: 1.016 (ref 1.005–1.030)
pH: 6.5 (ref 5.0–8.0)

## 2015-06-10 LAB — URINE MICROSCOPIC-ADD ON: BACTERIA UA: NONE SEEN

## 2015-06-11 ENCOUNTER — Encounter (HOSPITAL_BASED_OUTPATIENT_CLINIC_OR_DEPARTMENT_OTHER): Payer: BLUE CROSS/BLUE SHIELD

## 2015-06-11 ENCOUNTER — Other Ambulatory Visit (HOSPITAL_BASED_OUTPATIENT_CLINIC_OR_DEPARTMENT_OTHER): Payer: BLUE CROSS/BLUE SHIELD

## 2015-06-11 DIAGNOSIS — I340 Nonrheumatic mitral (valve) insufficiency: Secondary | ICD-10-CM

## 2015-06-11 DIAGNOSIS — I999 Unspecified disorder of circulatory system: Secondary | ICD-10-CM

## 2015-06-11 LAB — CBC WITH DIFFERENTIAL/PLATELET
BASOS ABS: 0 10*3/uL (ref 0.0–0.1)
BASOS PCT: 0 %
EOS ABS: 0.2 10*3/uL (ref 0.0–0.7)
EOS PCT: 1 %
HCT: 24.9 % — ABNORMAL LOW (ref 39.0–52.0)
Hemoglobin: 8 g/dL — ABNORMAL LOW (ref 13.0–17.0)
LYMPHS PCT: 18 %
Lymphs Abs: 2.3 10*3/uL (ref 0.7–4.0)
MCH: 25.6 pg — ABNORMAL LOW (ref 26.0–34.0)
MCHC: 32.1 g/dL (ref 30.0–36.0)
MCV: 79.8 fL (ref 78.0–100.0)
MONO ABS: 1.2 10*3/uL — AB (ref 0.1–1.0)
MONOS PCT: 9 %
NEUTROS ABS: 8.9 10*3/uL — AB (ref 1.7–7.7)
NEUTROS PCT: 72 %
PLATELETS: 479 10*3/uL — AB (ref 150–400)
RBC: 3.12 MIL/uL — AB (ref 4.22–5.81)
RDW: 17.2 % — ABNORMAL HIGH (ref 11.5–15.5)
WBC: 12.5 10*3/uL — ABNORMAL HIGH (ref 4.0–10.5)

## 2015-06-11 LAB — CBC
HEMATOCRIT: 26.7 % — AB (ref 39.0–52.0)
Hemoglobin: 8.3 g/dL — ABNORMAL LOW (ref 13.0–17.0)
MCH: 24.6 pg — AB (ref 26.0–34.0)
MCHC: 31.1 g/dL (ref 30.0–36.0)
MCV: 79.2 fL (ref 78.0–100.0)
Platelets: 479 10*3/uL — ABNORMAL HIGH (ref 150–400)
RBC: 3.37 MIL/uL — ABNORMAL LOW (ref 4.22–5.81)
RDW: 16.9 % — AB (ref 11.5–15.5)
WBC: 15.9 10*3/uL — ABNORMAL HIGH (ref 4.0–10.5)

## 2015-06-11 LAB — BASIC METABOLIC PANEL
ANION GAP: 12 (ref 5–15)
BUN: 8 mg/dL (ref 6–20)
CALCIUM: 9.4 mg/dL (ref 8.9–10.3)
CO2: 30 mmol/L (ref 22–32)
Chloride: 94 mmol/L — ABNORMAL LOW (ref 101–111)
Creatinine, Ser: <0.3 mg/dL — ABNORMAL LOW (ref 0.61–1.24)
Glucose, Bld: 101 mg/dL — ABNORMAL HIGH (ref 65–99)
POTASSIUM: 4.2 mmol/L (ref 3.5–5.1)
SODIUM: 136 mmol/L (ref 135–145)

## 2015-06-11 LAB — PHOSPHORUS: Phosphorus: 3.8 mg/dL (ref 2.5–4.6)

## 2015-06-11 LAB — PROTIME-INR
INR: 1.35 (ref 0.00–1.49)
PROTHROMBIN TIME: 16.8 s — AB (ref 11.6–15.2)

## 2015-06-11 LAB — MAGNESIUM: MAGNESIUM: 1.7 mg/dL (ref 1.7–2.4)

## 2015-06-11 LAB — APTT: aPTT: 46 seconds — ABNORMAL HIGH (ref 24–37)
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
## 2015-06-11 NOTE — Progress Notes (Signed)
VASCULAR LAB PRELIMINARY  ARTERIAL  ABI completed:  ABIs within normal limits.  Possible microemboli    RIGHT    LEFT    PRESSURE WAVEFORM  PRESSURE WAVEFORM  BRACHIAL 91 Triphasic  BRACHIAL PICC Triphasic   DP 105 Triphasic  DP Unable to obtain Monophasic Venous vs arterial  AT   AT    PT 86 Triphasic  PT  uanble to obtain due to large blister  PER   PER 89 Triphasic   GREAT TOE 33 (0.36 TBI)  GREAT TOE  absent    RIGHT LEFT  ABI 1.15 0.98     Chester Sibert, RVT 06/11/2015, 4:13 PM

## 2015-06-11 NOTE — Progress Notes (Signed)
  Echocardiogram 2D Echocardiogram has been performed.  [name] 06/11/2015, 5:52 PM

## 2015-06-12 ENCOUNTER — Other Ambulatory Visit (HOSPITAL_COMMUNITY): Payer: BLUE CROSS/BLUE SHIELD

## 2015-06-12 DIAGNOSIS — S90522A Blister (nonthermal), left ankle, initial encounter: Secondary | ICD-10-CM

## 2015-06-12 DIAGNOSIS — S90426A Blister (nonthermal), unspecified lesser toe(s), initial encounter: Secondary | ICD-10-CM

## 2015-06-12 DIAGNOSIS — S90521A Blister (nonthermal), right ankle, initial encounter: Secondary | ICD-10-CM

## 2015-06-12 LAB — BASIC METABOLIC PANEL
Anion gap: 14 (ref 5–15)
BUN: 19 mg/dL (ref 6–20)
CHLORIDE: 91 mmol/L — AB (ref 101–111)
CO2: 31 mmol/L (ref 22–32)
Calcium: 10 mg/dL (ref 8.9–10.3)
Creatinine, Ser: 0.33 mg/dL — ABNORMAL LOW (ref 0.61–1.24)
GFR calc Af Amer: >60 mL/min (ref >60)
GFR calc non Af Amer: >60 mL/min (ref >60)
GLUCOSE: 144 mg/dL — AB (ref 65–99)
POTASSIUM: 4 mmol/L (ref 3.5–5.1)
Sodium: 136 mmol/L (ref 135–145)

## 2015-06-12 LAB — PROCALCITONIN: Procalcitonin: 0.13 ng/mL

## 2015-06-12 LAB — URINE CULTURE: CULTURE: NO GROWTH

## 2015-06-12 LAB — CBC
HEMATOCRIT: 28.6 % — AB (ref 39.0–52.0)
Hemoglobin: 9.1 g/dL — ABNORMAL LOW (ref 13.0–17.0)
MCH: 25.1 pg — ABNORMAL LOW (ref 26.0–34.0)
MCHC: 31.8 g/dL (ref 30.0–36.0)
MCV: 78.8 fL (ref 78.0–100.0)
Platelets: 544 10*3/uL — ABNORMAL HIGH (ref 150–400)
RBC: 3.63 MIL/uL — ABNORMAL LOW (ref 4.22–5.81)
RDW: 16.6 % — AB (ref 11.5–15.5)
WBC: 18.2 10*3/uL — AB (ref 4.0–10.5)

## 2015-06-12 LAB — HEPARIN LEVEL (UNFRACTIONATED)
Heparin Unfractionated: 0.97 IU/mL — ABNORMAL HIGH (ref 0.30–0.70)
Heparin Unfractionated: 0.44 IU/mL (ref 0.30–0.70)
Heparin Unfractionated: 0.2 IU/mL — ABNORMAL LOW (ref 0.30–0.70)

## 2015-06-12 LAB — SEDIMENTATION RATE: Sed Rate: >140 mm/hr — ABNORMAL HIGH (ref 0–16)
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
## 2015-06-13 LAB — HEPARIN LEVEL (UNFRACTIONATED)
Heparin Unfractionated: 0.13 IU/mL — ABNORMAL LOW (ref 0.30–0.70)
Heparin Unfractionated: 0.13 IU/mL — ABNORMAL LOW (ref 0.30–0.70)

## 2015-06-14 LAB — CBC
HEMATOCRIT: 25.2 % — AB (ref 39.0–52.0)
Hemoglobin: 8.2 g/dL — ABNORMAL LOW (ref 13.0–17.0)
MCH: 25.5 pg — ABNORMAL LOW (ref 26.0–34.0)
MCHC: 32.5 g/dL (ref 30.0–36.0)
MCV: 78.3 fL (ref 78.0–100.0)
Platelets: 472 10*3/uL — ABNORMAL HIGH (ref 150–400)
RBC: 3.22 MIL/uL — ABNORMAL LOW (ref 4.22–5.81)
RDW: 17.2 % — AB (ref 11.5–15.5)
WBC: 11.8 10*3/uL — ABNORMAL HIGH (ref 4.0–10.5)

## 2015-06-14 LAB — BASIC METABOLIC PANEL
Anion gap: 11 (ref 5–15)
BUN: 11 mg/dL (ref 6–20)
CALCIUM: 9.6 mg/dL (ref 8.9–10.3)
CO2: 27 mmol/L (ref 22–32)
CREATININE: 0.38 mg/dL — AB (ref 0.61–1.24)
Chloride: 96 mmol/L — ABNORMAL LOW (ref 101–111)
GFR calc Af Amer: >60 mL/min (ref >60)
GLUCOSE: 112 mg/dL — AB (ref 65–99)
Potassium: 4.4 mmol/L (ref 3.5–5.1)
Sodium: 134 mmol/L — ABNORMAL LOW (ref 135–145)

## 2015-06-15 ENCOUNTER — Other Ambulatory Visit (HOSPITAL_COMMUNITY): Payer: BLUE CROSS/BLUE SHIELD

## 2015-06-15 DIAGNOSIS — J9611 Chronic respiratory failure with hypoxia: Secondary | ICD-10-CM

## 2015-06-15 DIAGNOSIS — J9811 Atelectasis: Secondary | ICD-10-CM

## 2015-06-15 LAB — CULTURE, BLOOD (ROUTINE X 2)
CULTURE: NO GROWTH
CULTURE: NO GROWTH

## 2015-06-15 LAB — CULTURE, RESPIRATORY

## 2015-06-15 LAB — VANCOMYCIN, TROUGH
VANCOMYCIN TR: 21 ug/mL — AB (ref 10.0–20.0)
Vancomycin Tr: 11 ug/mL (ref 10.0–20.0)

## 2015-06-15 LAB — CULTURE, RESPIRATORY W GRAM STAIN: Culture: NORMAL
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
## 2015-06-16 LAB — BASIC METABOLIC PANEL
Anion gap: 10 (ref 5–15)
BUN: 9 mg/dL (ref 6–20)
CHLORIDE: 98 mmol/L — AB (ref 101–111)
CO2: 23 mmol/L (ref 22–32)
Calcium: 9.2 mg/dL (ref 8.9–10.3)
Creatinine, Ser: <0.3 mg/dL — ABNORMAL LOW (ref 0.61–1.24)
Glucose, Bld: 129 mg/dL — ABNORMAL HIGH (ref 65–99)
POTASSIUM: 3.8 mmol/L (ref 3.5–5.1)
SODIUM: 131 mmol/L — AB (ref 135–145)

## 2015-06-16 LAB — CBC
HCT: 23.5 % — ABNORMAL LOW (ref 39.0–52.0)
HEMOGLOBIN: 7.7 g/dL — AB (ref 13.0–17.0)
MCH: 25.8 pg — AB (ref 26.0–34.0)
MCHC: 32.8 g/dL (ref 30.0–36.0)
MCV: 78.9 fL (ref 78.0–100.0)
Platelets: 444 10*3/uL — ABNORMAL HIGH (ref 150–400)
RBC: 2.98 MIL/uL — AB (ref 4.22–5.81)
RDW: 17.3 % — ABNORMAL HIGH (ref 11.5–15.5)
WBC: 9.5 10*3/uL (ref 4.0–10.5)

## 2015-06-16 LAB — VANCOMYCIN, TROUGH: VANCOMYCIN TR: 17 ug/mL (ref 10.0–20.0)

## 2015-06-20 LAB — BASIC METABOLIC PANEL
ANION GAP: 11 (ref 5–15)
BUN: 8 mg/dL (ref 6–20)
CALCIUM: 9.5 mg/dL (ref 8.9–10.3)
CHLORIDE: 97 mmol/L — AB (ref 101–111)
CO2: 27 mmol/L (ref 22–32)
Creatinine, Ser: <0.3 mg/dL — ABNORMAL LOW (ref 0.61–1.24)
GLUCOSE: 112 mg/dL — AB (ref 65–99)
POTASSIUM: 3.6 mmol/L (ref 3.5–5.1)
Sodium: 135 mmol/L (ref 135–145)

## 2015-06-21 ENCOUNTER — Other Ambulatory Visit (HOSPITAL_COMMUNITY): Payer: BLUE CROSS/BLUE SHIELD

## 2015-06-21 LAB — CBC
HEMATOCRIT: 24.8 % — AB (ref 39.0–52.0)
HEMOGLOBIN: 8 g/dL — AB (ref 13.0–17.0)
MCH: 25.3 pg — ABNORMAL LOW (ref 26.0–34.0)
MCHC: 32.3 g/dL (ref 30.0–36.0)
MCV: 78.5 fL (ref 78.0–100.0)
Platelets: 597 10*3/uL — ABNORMAL HIGH (ref 150–400)
RBC: 3.16 MIL/uL — ABNORMAL LOW (ref 4.22–5.81)
RDW: 17.7 % — ABNORMAL HIGH (ref 11.5–15.5)
WBC: 9.5 10*3/uL (ref 4.0–10.5)

## 2015-06-21 LAB — C-REACTIVE PROTEIN: CRP: 14 mg/dL — ABNORMAL HIGH (ref <1.0)

## 2015-06-23 LAB — CBC
HEMATOCRIT: 25 % — AB (ref 39.0–52.0)
HEMOGLOBIN: 7.6 g/dL — AB (ref 13.0–17.0)
MCH: 24 pg — AB (ref 26.0–34.0)
MCHC: 30.4 g/dL (ref 30.0–36.0)
MCV: 78.9 fL (ref 78.0–100.0)
Platelets: 616 10*3/uL — ABNORMAL HIGH (ref 150–400)
RBC: 3.17 MIL/uL — ABNORMAL LOW (ref 4.22–5.81)
RDW: 17.9 % — ABNORMAL HIGH (ref 11.5–15.5)
WBC: 10.1 10*3/uL (ref 4.0–10.5)

## 2015-06-25 LAB — BASIC METABOLIC PANEL
ANION GAP: 10 (ref 5–15)
BUN: 9 mg/dL (ref 6–20)
CHLORIDE: 99 mmol/L — AB (ref 101–111)
CO2: 29 mmol/L (ref 22–32)
Calcium: 10.1 mg/dL (ref 8.9–10.3)
Creatinine, Ser: 0.36 mg/dL — ABNORMAL LOW (ref 0.61–1.24)
GFR calc Af Amer: >60 mL/min (ref >60)
GFR calc non Af Amer: >60 mL/min (ref >60)
GLUCOSE: 106 mg/dL — AB (ref 65–99)
POTASSIUM: 4.3 mmol/L (ref 3.5–5.1)
Sodium: 138 mmol/L (ref 135–145)

## 2015-06-25 LAB — CBC WITH DIFFERENTIAL/PLATELET
Basophils Absolute: 0 10*3/uL (ref 0.0–0.1)
Basophils Relative: 0 %
EOS PCT: 5 %
Eosinophils Absolute: 0.5 10*3/uL (ref 0.0–0.7)
HCT: 26 % — ABNORMAL LOW (ref 39.0–52.0)
Hemoglobin: 8.2 g/dL — ABNORMAL LOW (ref 13.0–17.0)
LYMPHS ABS: 1.9 10*3/uL (ref 0.7–4.0)
LYMPHS PCT: 18 %
MCH: 24.9 pg — AB (ref 26.0–34.0)
MCHC: 31.5 g/dL (ref 30.0–36.0)
MCV: 79 fL (ref 78.0–100.0)
MONO ABS: 0.9 10*3/uL (ref 0.1–1.0)
Monocytes Relative: 8 %
Neutro Abs: 7.2 10*3/uL (ref 1.7–7.7)
Neutrophils Relative %: 69 %
PLATELETS: 609 10*3/uL — AB (ref 150–400)
RBC: 3.29 MIL/uL — AB (ref 4.22–5.81)
RDW: 18.1 % — AB (ref 11.5–15.5)
WBC: 10.5 10*3/uL (ref 4.0–10.5)

## 2015-06-26 ENCOUNTER — Other Ambulatory Visit (HOSPITAL_COMMUNITY): Payer: BLUE CROSS/BLUE SHIELD

## 2015-06-26 LAB — BASIC METABOLIC PANEL
Anion gap: 11 (ref 5–15)
BUN: 12 mg/dL (ref 6–20)
CO2: 29 mmol/L (ref 22–32)
CREATININE: 0.32 mg/dL — AB (ref 0.61–1.24)
Calcium: 9.9 mg/dL (ref 8.9–10.3)
Chloride: 97 mmol/L — ABNORMAL LOW (ref 101–111)
GFR calc Af Amer: >60 mL/min (ref >60)
GLUCOSE: 115 mg/dL — AB (ref 65–99)
POTASSIUM: 3.9 mmol/L (ref 3.5–5.1)
SODIUM: 137 mmol/L (ref 135–145)

## 2015-06-26 LAB — CBC
HCT: 25.2 % — ABNORMAL LOW (ref 39.0–52.0)
Hemoglobin: 8.1 g/dL — ABNORMAL LOW (ref 13.0–17.0)
MCH: 25.6 pg — AB (ref 26.0–34.0)
MCHC: 32.1 g/dL (ref 30.0–36.0)
MCV: 79.5 fL (ref 78.0–100.0)
PLATELETS: 565 10*3/uL — AB (ref 150–400)
RBC: 3.17 MIL/uL — AB (ref 4.22–5.81)
RDW: 18 % — ABNORMAL HIGH (ref 11.5–15.5)
WBC: 9.1 10*3/uL (ref 4.0–10.5)

## 2015-06-27 LAB — VANCOMYCIN, TROUGH: Vancomycin Tr: 9 ug/mL — ABNORMAL LOW (ref 10.0–20.0)

## 2015-06-27 LAB — CULTURE, BLOOD (ROUTINE X 2): Culture: NO GROWTH

## 2015-06-28 LAB — CULTURE, BLOOD (ROUTINE X 2): Culture: NO GROWTH

## 2015-06-28 LAB — CULTURE, RESPIRATORY

## 2015-06-28 LAB — URINE CULTURE: Culture: 30000

## 2015-06-28 LAB — CULTURE, RESPIRATORY W GRAM STAIN

## 2015-06-28 LAB — VANCOMYCIN, TROUGH: Vancomycin Tr: <4 ug/mL — ABNORMAL LOW (ref 10.0–20.0)

## 2015-06-30 LAB — CULTURE, BLOOD (ROUTINE X 2)
CULTURE: NO GROWTH
CULTURE: NO GROWTH

## 2015-07-02 ENCOUNTER — Other Ambulatory Visit (HOSPITAL_COMMUNITY): Payer: BLUE CROSS/BLUE SHIELD

## 2015-07-02 LAB — SEDIMENTATION RATE

## 2015-07-02 LAB — CBC WITH DIFFERENTIAL/PLATELET
BASOS ABS: 0 10*3/uL (ref 0.0–0.1)
BASOS PCT: 0 %
EOS ABS: 0.5 10*3/uL (ref 0.0–0.7)
EOS PCT: 6 %
HCT: 24.9 % — ABNORMAL LOW (ref 39.0–52.0)
Hemoglobin: 7.6 g/dL — ABNORMAL LOW (ref 13.0–17.0)
LYMPHS ABS: 1.6 10*3/uL (ref 0.7–4.0)
Lymphocytes Relative: 19 %
MCH: 23.7 pg — AB (ref 26.0–34.0)
MCHC: 30.5 g/dL (ref 30.0–36.0)
MCV: 77.6 fL — ABNORMAL LOW (ref 78.0–100.0)
Monocytes Absolute: 1 10*3/uL (ref 0.1–1.0)
Monocytes Relative: 12 %
NEUTROS PCT: 63 %
Neutro Abs: 5.3 10*3/uL (ref 1.7–7.7)
PLATELETS: 544 10*3/uL — AB (ref 150–400)
RBC: 3.21 MIL/uL — AB (ref 4.22–5.81)
RDW: 17.8 % — ABNORMAL HIGH (ref 11.5–15.5)
WBC: 8.5 10*3/uL (ref 4.0–10.5)

## 2015-07-02 LAB — BASIC METABOLIC PANEL
Anion gap: 10 (ref 5–15)
BUN: 7 mg/dL (ref 6–20)
CO2: 28 mmol/L (ref 22–32)
Calcium: 9.7 mg/dL (ref 8.9–10.3)
Chloride: 99 mmol/L — ABNORMAL LOW (ref 101–111)
Creatinine, Ser: <0.3 mg/dL — ABNORMAL LOW (ref 0.61–1.24)
Glucose, Bld: 109 mg/dL — ABNORMAL HIGH (ref 65–99)
POTASSIUM: 3.9 mmol/L (ref 3.5–5.1)
SODIUM: 137 mmol/L (ref 135–145)

## 2015-07-03 LAB — VANCOMYCIN, TROUGH: Vancomycin Tr: 15 ug/mL (ref 10.0–20.0)

## 2015-07-05 ENCOUNTER — Encounter (HOSPITAL_COMMUNITY): Payer: Self-pay | Admitting: Emergency Medicine

## 2015-10-26 ENCOUNTER — Encounter (HOSPITAL_COMMUNITY): Payer: Self-pay | Admitting: Emergency Medicine

## 2015-10-26 ENCOUNTER — Observation Stay (HOSPITAL_COMMUNITY)
Admission: EM | Admit: 2015-10-26 | Discharge: 2015-10-28 | Disposition: A | Discharge status: Home / Self Care | Payer: 59 | Attending: Internal Medicine | Admitting: Internal Medicine

## 2015-10-26 ENCOUNTER — Emergency Department (HOSPITAL_COMMUNITY): Payer: 59

## 2015-10-26 DIAGNOSIS — K5900 Constipation, unspecified: Secondary | ICD-10-CM | POA: Diagnosis present

## 2015-10-26 DIAGNOSIS — R109 Unspecified abdominal pain: Secondary | ICD-10-CM

## 2015-10-26 DIAGNOSIS — R1031 Right lower quadrant pain: Secondary | ICD-10-CM | POA: Insufficient documentation

## 2015-10-26 DIAGNOSIS — Z931 Gastrostomy status: Secondary | ICD-10-CM | POA: Diagnosis not present

## 2015-10-26 DIAGNOSIS — G8254 Quadriplegia, C5-C7 incomplete: Secondary | ICD-10-CM | POA: Diagnosis not present

## 2015-10-26 DIAGNOSIS — N390 Urinary tract infection, site not specified: Principal | ICD-10-CM | POA: Diagnosis present

## 2015-10-26 DIAGNOSIS — R7989 Other specified abnormal findings of blood chemistry: Secondary | ICD-10-CM | POA: Diagnosis present

## 2015-10-26 HISTORY — DX: Quadriplegia, unspecified: G82.50

## 2015-10-26 HISTORY — DX: Unspecified injury at unspecified level of cervical spinal cord, sequela: S14.109S

## 2015-10-26 HISTORY — DX: Pressure ulcer of sacral region, unspecified stage: L89.159

## 2015-10-26 LAB — COMPREHENSIVE METABOLIC PANEL
ALT: 17 U/L (ref 17–63)
ANION GAP: 8 (ref 5–15)
AST: 21 U/L (ref 15–41)
Albumin: 2.9 g/dL — ABNORMAL LOW (ref 3.5–5.0)
Alkaline Phosphatase: 75 U/L (ref 38–126)
BUN: 9 mg/dL (ref 6–20)
CHLORIDE: 99 mmol/L — AB (ref 101–111)
CO2: 28 mmol/L (ref 22–32)
Calcium: 10.4 mg/dL — ABNORMAL HIGH (ref 8.9–10.3)
Creatinine, Ser: 0.33 mg/dL — ABNORMAL LOW (ref 0.61–1.24)
Glucose, Bld: 100 mg/dL — ABNORMAL HIGH (ref 65–99)
POTASSIUM: 3.6 mmol/L (ref 3.5–5.1)
Sodium: 135 mmol/L (ref 135–145)
Total Bilirubin: 0 mg/dL — ABNORMAL LOW (ref 0.3–1.2)
Total Protein: 7.8 g/dL (ref 6.5–8.1)

## 2015-10-26 LAB — URINALYSIS, ROUTINE W REFLEX MICROSCOPIC
Bilirubin Urine: NEGATIVE
GLUCOSE, UA: NEGATIVE mg/dL
KETONES UR: NEGATIVE mg/dL
Nitrite: NEGATIVE
PROTEIN: 30 mg/dL — AB
Specific Gravity, Urine: 1.02 (ref 1.005–1.030)
pH: 8 (ref 5.0–8.0)

## 2015-10-26 LAB — URINE MICROSCOPIC-ADD ON

## 2015-10-26 LAB — CBC WITH DIFFERENTIAL/PLATELET
BASOS ABS: 0 10*3/uL (ref 0.0–0.1)
Basophils Relative: 0 %
Eosinophils Absolute: 0.6 10*3/uL (ref 0.0–0.7)
Eosinophils Relative: 9 %
HEMATOCRIT: 28.6 % — AB (ref 39.0–52.0)
Hemoglobin: 9.3 g/dL — ABNORMAL LOW (ref 13.0–17.0)
LYMPHS PCT: 22 %
Lymphs Abs: 1.4 10*3/uL (ref 0.7–4.0)
MCH: 24.5 pg — ABNORMAL LOW (ref 26.0–34.0)
MCHC: 32.5 g/dL (ref 30.0–36.0)
MCV: 75.3 fL — AB (ref 78.0–100.0)
Monocytes Absolute: 0.8 10*3/uL (ref 0.1–1.0)
Monocytes Relative: 11 %
NEUTROS ABS: 3.8 10*3/uL (ref 1.7–7.7)
Neutrophils Relative %: 58 %
PLATELETS: 542 10*3/uL — AB (ref 150–400)
RBC: 3.8 MIL/uL — AB (ref 4.22–5.81)
RDW: 15.9 % — ABNORMAL HIGH (ref 11.5–15.5)
WBC: 6.6 10*3/uL (ref 4.0–10.5)

## 2015-10-26 LAB — LIPASE, BLOOD: LIPASE: 17 U/L (ref 11–51)

## 2015-10-26 LAB — LACTIC ACID, PLASMA: Lactic Acid, Venous: 1.6 mmol/L (ref 0.5–1.9)

## 2015-10-26 MED ORDER — PIPERACILLIN-TAZOBACTAM 3.375 G IVPB
3.3750 g | Freq: Three times a day (TID) | INTRAVENOUS | Status: DC
  Administered 2015-10-26 – 2015-10-27 (×2): 3.375 g via INTRAVENOUS
  Filled 2015-10-26 (×2): qty 50

## 2015-10-26 MED ORDER — DIATRIZOATE MEGLUMINE & SODIUM 66-10 % PO SOLN
ORAL | Status: AC
  Administered 2015-10-26: 30 mL via ORAL
  Filled 2015-10-26: qty 30

## 2015-10-26 MED ORDER — SODIUM CHLORIDE 0.9 % IV SOLN
INTRAVENOUS | Status: DC
  Administered 2015-10-26: 18:00:00 via INTRAVENOUS

## 2015-10-26 MED ORDER — CIPROFLOXACIN IN D5W 400 MG/200ML IV SOLN: 400.0000 mg | Freq: Two times a day (BID) | INTRAVENOUS | Status: DC

## 2015-10-26 NOTE — ED Notes (Signed)
Pt brought in from home with multiple medical problems.  Pt has trach, foley, and wound vac.  States he was sent for IV abx for UTI.  States foley last changed when he was d/c from the hospital and is unsure when that is.

## 2015-10-26 NOTE — ED Notes (Signed)
Called and informed family of patient's status. Family updated on room and admission.

## 2015-10-26 NOTE — Progress Notes (Addendum)
Pharmacy Antibiotic Note  [name] is a 50 y.o. male admitted on 10/26/2015 with UTI.  Pharmacy has been consulted for zosyn dosing.  Plan: Zosyn 3.375 gm IV q8 hours F/u cultures and clinical course  Height: 6\' 1"  (185.4 cm) Weight: 120 lb (54.432 kg) IBW/kg (Calculated) : 79.9  Temp (24hrs), Avg:98.3 F (36.8 C), Min:98.3 F (36.8 C), Max:98.3 F (36.8 C)   Recent Labs Lab 10/26/15 1700 10/26/15 1720  WBC 6.6  --   CREATININE 0.33*  --   LATICACIDVEN  --  1.6    Estimated Creatinine Clearance: 85 mL/min (by C-G formula based on Cr of 0.33).    Allergies  Allergen Reactions  . Niacin And Related Hives  . Other Hives    From work uniform  . Shellfish Allergy Swelling    Lip swelling    Thank you for allowing pharmacy to be a part of this patient's care.  Excell Seltzer Poteet 10/26/2015 9:22 PM
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
## 2015-10-26 NOTE — ED Notes (Signed)
Pt suctioned with 14 french suction catheter. Moderate amount of clear secretions

## 2015-10-27 ENCOUNTER — Encounter (HOSPITAL_COMMUNITY): Payer: Self-pay | Admitting: *Deleted

## 2015-10-27 DIAGNOSIS — N390 Urinary tract infection, site not specified: Secondary | ICD-10-CM | POA: Diagnosis not present

## 2015-10-27 LAB — CBC
HEMATOCRIT: 28.8 % — AB (ref 39.0–52.0)
HEMOGLOBIN: 9.2 g/dL — AB (ref 13.0–17.0)
MCH: 24.2 pg — ABNORMAL LOW (ref 26.0–34.0)
MCHC: 31.9 g/dL (ref 30.0–36.0)
MCV: 75.8 fL — ABNORMAL LOW (ref 78.0–100.0)
PLATELETS: 536 10*3/uL — AB (ref 150–400)
RBC: 3.8 MIL/uL — AB (ref 4.22–5.81)
RDW: 15.9 % — ABNORMAL HIGH (ref 11.5–15.5)
WBC: 6.4 10*3/uL (ref 4.0–10.5)

## 2015-10-27 LAB — BASIC METABOLIC PANEL
ANION GAP: 9 (ref 5–15)
BUN: 7 mg/dL (ref 6–20)
CO2: 25 mmol/L (ref 22–32)
Calcium: 10.4 mg/dL — ABNORMAL HIGH (ref 8.9–10.3)
Chloride: 103 mmol/L (ref 101–111)
Creatinine, Ser: <0.3 mg/dL — ABNORMAL LOW (ref 0.61–1.24)
GLUCOSE: 101 mg/dL — AB (ref 65–99)
POTASSIUM: 3.2 mmol/L — AB (ref 3.5–5.1)
Sodium: 137 mmol/L (ref 135–145)

## 2015-10-27 LAB — MRSA PCR SCREENING: MRSA BY PCR: POSITIVE — AB

## 2015-10-27 MED ORDER — ONDANSETRON HCL 4 MG PO TABS: 4.0000 mg | ORAL_TABLET | Freq: Four times a day (QID) | ORAL | Status: DC | PRN

## 2015-10-27 MED ORDER — GABAPENTIN 300 MG PO CAPS
300.0000 mg | ORAL_CAPSULE | Freq: Every day | ORAL | Status: DC
  Administered 2015-10-27 (×2): 300 mg via ORAL
  Filled 2015-10-27 (×2): qty 1

## 2015-10-27 MED ORDER — POLYETHYLENE GLYCOL 3350 17 G PO PACK
17.0000 g | PACK | Freq: Two times a day (BID) | ORAL | Status: DC
  Administered 2015-10-27: 17 g via ORAL
  Filled 2015-10-27 (×2): qty 1

## 2015-10-27 MED ORDER — DOCUSATE SODIUM 100 MG PO CAPS
200.0000 mg | ORAL_CAPSULE | Freq: Two times a day (BID) | ORAL | Status: DC
  Administered 2015-10-27: 200 mg via ORAL
  Filled 2015-10-27 (×2): qty 2

## 2015-10-27 MED ORDER — ADULT MULTIVITAMIN W/MINERALS CH
1.0000 | ORAL_TABLET | Freq: Two times a day (BID) | ORAL | Status: DC
  Administered 2015-10-27 – 2015-10-28 (×2): 1 via ORAL
  Filled 2015-10-27 (×2): qty 1

## 2015-10-27 MED ORDER — ZOLPIDEM TARTRATE 5 MG PO TABS
5.0000 mg | ORAL_TABLET | Freq: Every day | ORAL | Status: DC
  Administered 2015-10-27 (×2): 5 mg via ORAL
  Filled 2015-10-27 (×2): qty 1

## 2015-10-27 MED ORDER — POTASSIUM CHLORIDE 20 MEQ/15ML (10%) PO SOLN
40.0000 meq | Freq: Four times a day (QID) | ORAL | Status: DC
  Administered 2015-10-27: 40 meq via ORAL
  Filled 2015-10-27: qty 30

## 2015-10-27 MED ORDER — MUPIROCIN 2 % EX OINT
1.0000 "application " | TOPICAL_OINTMENT | Freq: Two times a day (BID) | CUTANEOUS | Status: DC
  Administered 2015-10-27 – 2015-10-28 (×2): 1 via NASAL
  Filled 2015-10-27: qty 22

## 2015-10-27 MED ORDER — SERTRALINE HCL 50 MG PO TABS
50.0000 mg | ORAL_TABLET | Freq: Every day | ORAL | Status: DC
  Administered 2015-10-27 – 2015-10-28 (×2): 50 mg via ORAL
  Filled 2015-10-27 (×2): qty 1

## 2015-10-27 MED ORDER — HEPARIN SODIUM (PORCINE) 5000 UNIT/ML IJ SOLN
5000.0000 [IU] | Freq: Three times a day (TID) | INTRAMUSCULAR | Status: DC
  Administered 2015-10-27 – 2015-10-28 (×4): 5000 [IU] via SUBCUTANEOUS
  Filled 2015-10-27 (×4): qty 1

## 2015-10-27 MED ORDER — CHLORHEXIDINE GLUCONATE CLOTH 2 % EX PADS
6.0000 | MEDICATED_PAD | Freq: Every day | CUTANEOUS | Status: DC
  Administered 2015-10-28: 6 via TOPICAL

## 2015-10-27 MED ORDER — ENSURE ENLIVE PO LIQD
237.0000 mL | Freq: Two times a day (BID) | ORAL | Status: DC
  Administered 2015-10-27 – 2015-10-28 (×3): 237 mL via ORAL

## 2015-10-27 MED ORDER — GLYCOPYRROLATE 1 MG PO TABS
2.0000 mg | ORAL_TABLET | Freq: Three times a day (TID) | ORAL | Status: DC | PRN
  Filled 2015-10-27: qty 2

## 2015-10-27 MED ORDER — MAGNESIUM CITRATE PO SOLN
0.5000 | Freq: Once | ORAL | Status: AC
  Administered 2015-10-27: 0.5 via ORAL
  Filled 2015-10-27: qty 296

## 2015-10-27 MED ORDER — PRO-STAT SUGAR FREE PO LIQD
30.0000 mL | Freq: Two times a day (BID) | ORAL | Status: DC
  Administered 2015-10-28: 30 mL via ORAL
  Filled 2015-10-27 (×2): qty 30

## 2015-10-27 MED ORDER — CLONAZEPAM 0.5 MG PO TABS
0.5000 mg | ORAL_TABLET | Freq: Every day | ORAL | Status: DC
  Administered 2015-10-27: 0.5 mg via ORAL
  Filled 2015-10-27 (×2): qty 1

## 2015-10-27 MED ORDER — ONDANSETRON HCL 4 MG/2ML IJ SOLN: 4.0000 mg | Freq: Four times a day (QID) | INTRAMUSCULAR | Status: DC | PRN

## 2015-10-27 MED ORDER — POTASSIUM CHLORIDE CRYS ER 20 MEQ PO TBCR
40.0000 meq | EXTENDED_RELEASE_TABLET | Freq: Once | ORAL | Status: AC
  Administered 2015-10-27: 40 meq via ORAL
  Filled 2015-10-27: qty 2

## 2015-10-27 MED ORDER — FAMOTIDINE 20 MG PO TABS
20.0000 mg | ORAL_TABLET | Freq: Two times a day (BID) | ORAL | Status: DC
  Administered 2015-10-27 – 2015-10-28 (×3): 20 mg via ORAL
  Filled 2015-10-27 (×4): qty 1

## 2015-10-27 MED ORDER — SODIUM CHLORIDE 0.9 % IV SOLN
INTRAVENOUS | Status: AC
  Administered 2015-10-27 – 2015-10-28 (×2): via INTRAVENOUS

## 2015-10-27 MED ORDER — BOOST / RESOURCE BREEZE PO LIQD: 1.0000 | Freq: Three times a day (TID) | ORAL | Status: DC

## 2015-10-27 MED ORDER — MIDODRINE HCL 5 MG PO TABS
10.0000 mg | ORAL_TABLET | Freq: Three times a day (TID) | ORAL | Status: DC
  Administered 2015-10-27 – 2015-10-28 (×5): 10 mg via ORAL
  Filled 2015-10-27 (×5): qty 2

## 2015-10-27 MED ORDER — OXYCODONE HCL 5 MG PO TABS
10.0000 mg | ORAL_TABLET | Freq: Three times a day (TID) | ORAL | Status: DC | PRN
  Administered 2015-10-27: 10 mg via ORAL
  Filled 2015-10-27: qty 2

## 2015-10-27 MED ORDER — BISACODYL 10 MG RE SUPP
10.0000 mg | Freq: Every day | RECTAL | Status: DC
  Administered 2015-10-27: 10 mg via RECTAL
  Filled 2015-10-27: qty 1

## 2015-10-27 NOTE — Progress Notes (Signed)
RN assessed patient wounds, PT wound on his sacrum has wound vac but patient had Large BM, Wound Vac Dressing is opened with a lot of stool on it, RN decided to removed wound vac dressing and put wet to dry dressing with ABD pads temporary because of no orders yet. Pt has wound consult but not been seen yet. Patient have a lot of Open wound on his BLE. RN changed all the dressings. Will continue to monitor.

## 2015-10-27 NOTE — Progress Notes (Signed)
Suction patient through [name] is capped after. Obtained small to moderate amount thin clear secretions.  Patient has an 6 trach no inner cannula , cuffless . Patient has a large amount of air coming around trach through stoma.  He breathes through his nose mostly. Lurline Idol is mostly for airway clearance.

## 2015-10-27 NOTE — Progress Notes (Signed)
Pt has multiple wounds on lower extremities and sacrum. There is a wound vac attached to sacral wound. The back of the left knee has a 2x3 cm unstageable. The left knee has a 2x3 cm with yellow exudate. The left lateral knee has a stage 2, 3x3 cm. The left top foot has a 16x9.5 cm wound and the heels have moisture associated skin damage. The left great toe has an unstageable that is a 1x1.5. There are abrasions to the 3rd and 4th toes and necrosis occuring to the top anterior portions of those toes on the left foot. Unstagebles are on the left lateral portions of the left foot measuring 9x4.5 cm. Eschar is also present. The left posterior lower leg has slugh and eschar measuring 13x7 cm. There is an old scar on the left shin.  The right heel has moisture associated skin damage and eschar. It ias 1 scab measuring 5cmx2 cm. The right top foot has a wound measuring 14cmx7 cm. The lateral portion of the foot has eschar and slough. He wound is an unstageable measuring 9x4 cm. There is a wound on the right lateral posterior foot measuring 7x5 cm. Finally there is a wound on the right lower leg measuring 7x3 cm.
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
## 2015-10-28 DIAGNOSIS — N390 Urinary tract infection, site not specified: Secondary | ICD-10-CM | POA: Diagnosis not present

## 2015-10-28 LAB — POTASSIUM: POTASSIUM: 3.6 mmol/L (ref 3.5–5.1)

## 2015-10-28 MED ORDER — DOCUSATE SODIUM 100 MG PO CAPS: 100.0000 mg | ORAL_CAPSULE | Freq: Two times a day (BID) | ORAL | 0 refills | Status: DC

## 2015-10-28 MED ORDER — POLYETHYLENE GLYCOL 3350 17 G PO PACK: 17.0000 g | PACK | Freq: Every day | ORAL | 0 refills | Status: DC

## 2015-10-28 MED ORDER — BISACODYL 10 MG RE SUPP: 10.0000 mg | Freq: Every day | RECTAL | 0 refills | Status: DC | PRN

## 2015-10-28 NOTE — Progress Notes (Signed)
CM contacted [facility] and patient will restart services.  Information faxed.

## 2015-10-28 NOTE — Care Management Note (Signed)
Case Management Note  Patient Details  Name: AZRAEL DIALLO MRN: [id number] Date of Birth: [birth year]  Subjective/Objective:                    Action/Plan:   Expected Discharge Date:   10/28/15               Expected Discharge Plan:  [place]  In-House Referral:  Clinical Social Work  Discharge planning Services  CM Consult  Post Acute Care Choice:    Choice offered to:  Patient  DME Arranged:    DME Agency:     HH Arranged:  RN, Nurse's Aide, Social Work [employer] Agency:  [place]  Status of Service:  Completed, signed off  If discussed at [employer], dates discussed:    Additional Comments:  Briant Sites, RN 10/28/2015, 12:23 PM
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
## 2015-10-28 NOTE — Discharge Instructions (Signed)
Follow with Primary MD Alvester Chou, NP in 7 days   Get CBC, CMP, Abdominal X ray checked  by Primary MD or SNF MD in 5-7 days ( we routinely change or add medications that can affect your baseline labs and fluid status, therefore we recommend that you get the mentioned basic workup next visit with your PCP, your PCP may decide not to get them or add new tests based on their clinical decision)   Activity: As tolerated with Full fall precautions use walker/cane & assistance as needed   Disposition Home    Diet:   As before unchanged - Soft Heart Healthy  with feeding assistance and aspiration precautions.  For Heart failure patients - Check your Weight same time everyday, if you gain over 2 pounds, or you develop in leg swelling, experience more shortness of breath or chest pain, call your Primary MD immediately. Follow Cardiac Low Salt Diet and 1.5 lit/day fluid restriction.   On your next visit with your primary care physician please Get Medicines reviewed and adjusted.   Please request your Prim.MD to go over all Hospital Tests and Procedure/Radiological results at the follow up, please get all Hospital records sent to your Prim MD by signing hospital release before you go home.   If you experience worsening of your admission symptoms, develop shortness of breath, life threatening emergency, suicidal or homicidal thoughts you must seek medical attention immediately by calling 911 or calling your MD immediately  if symptoms less severe.  You Must read complete instructions/literature along with all the possible adverse reactions/side effects for all the Medicines you take and that have been prescribed to you. Take any new Medicines after you have completely understood and accpet all the possible adverse reactions/side effects.   Do not drive, operate heavy machinery, perform activities at heights, swimming or participation in water activities or provide baby sitting services if your were  admitted for syncope or siezures until you have seen by Primary MD or a Neurologist and advised to do so again.  Do not drive when taking Pain medications.    Do not take more than prescribed Pain, Sleep and Anxiety Medications  Special Instructions: If you have smoked or chewed Tobacco  in the last 2 yrs please stop smoking, stop any regular Alcohol  and or any Recreational drug use.  Wear Seat belts while driving.   Please note  You were cared for by a hospitalist during your hospital stay. If you have any questions about your discharge medications or the care you received while you were in the hospital after you are discharged, you can call the unit and asked to speak with the hospitalist on call if the hospitalist that took care of you is not available. Once you are discharged, your primary care physician will handle any further medical issues. Please note that NO REFILLS for any discharge medications will be authorized once you are discharged, as it is imperative that you return to your primary care physician (or establish a relationship with a primary care physician if you do not have one) for your aftercare needs so that they can reassess your need for medications and monitor your lab values.

## 2015-10-29 LAB — URINE CULTURE

## 2016-01-09 ENCOUNTER — Inpatient Hospital Stay (HOSPITAL_COMMUNITY): Admission: RE | Admit: 2016-01-09 | Payer: Self-pay | Source: Ambulatory Visit

## 2016-01-23 ENCOUNTER — Ambulatory Visit (HOSPITAL_COMMUNITY)
Admission: RE | Admit: 2016-01-23 | Discharge: 2016-01-23 | Disposition: A | Discharge status: Home / Self Care | Payer: 59 | Source: Ambulatory Visit | Attending: Acute Care | Admitting: Acute Care

## 2016-01-23 ENCOUNTER — Ambulatory Visit (HOSPITAL_BASED_OUTPATIENT_CLINIC_OR_DEPARTMENT_OTHER): Admission: EM | Admit: 2016-01-23 | Payer: 59 | Admitting: Acute Care

## 2016-01-23 DIAGNOSIS — G8250 Quadriplegia, unspecified: Secondary | ICD-10-CM | POA: Diagnosis present

## 2016-01-23 DIAGNOSIS — Z930 Tracheostomy status: Secondary | ICD-10-CM | POA: Insufficient documentation

## 2016-01-23 DIAGNOSIS — Z430 Encounter for attention to tracheostomy: Secondary | ICD-10-CM | POA: Insufficient documentation
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
## 2016-01-23 NOTE — Progress Notes (Signed)
Tracheostomy Procedure Note  [name] [id number] 02/21/1966  Pre Procedure Tracheostomy Information  Trach Brand: Shiley Size: 6.0 Style: Uncuffed Secured by: Velcro   Procedure:  Pre BP 119/75 HR 110 RR 18 Spo2 100   Post Procedure Tracheostomy Information  Trach Brand: Shiley Size: 6.0 Style: Uncuffed Secured by: Velcro   Post Procedure Evaluation:  ETCO2 positive color change from yellow to purple : Yes.   Vital signs:blood pressure 128/103, pulse 110, respirations 18 and pulse oximetry 100 % Patients current condition: stable Complications: No apparent complications Trach site exam: clean Wound care done: dry Patient did tolerate procedure well.   Education Showed wife how to replace inner cannula  Prescription needs: none    Additional needs: none

## 2016-02-26 ENCOUNTER — Other Ambulatory Visit (HOSPITAL_COMMUNITY): Payer: Self-pay | Admitting: Adult Health

## 2016-02-26 ENCOUNTER — Telehealth (HOSPITAL_COMMUNITY): Payer: Self-pay

## 2016-02-26 DIAGNOSIS — Z931 Gastrostomy status: Secondary | ICD-10-CM

## 2016-02-26 NOTE — Telephone Encounter (Signed)
Called to schedule peg tube removal, no answer, no vm. AW

## 2016-03-06 ENCOUNTER — Telehealth (HOSPITAL_COMMUNITY): Payer: Self-pay

## 2016-03-06 NOTE — Telephone Encounter (Signed)
Called to schedule g tube removal, no answer, no vm. AW

## 2016-03-20 ENCOUNTER — Telehealth (HOSPITAL_COMMUNITY): Payer: Self-pay

## 2016-03-20 NOTE — Telephone Encounter (Signed)
Called to schedule gtube removal, no answer, no vm. AW

## 2016-03-24 ENCOUNTER — Encounter (HOSPITAL_COMMUNITY): Payer: Self-pay

## 2016-03-24 ENCOUNTER — Ambulatory Visit (HOSPITAL_COMMUNITY): Admission: RE | Admit: 2016-03-24 | Payer: No Typology Code available for payment source | Source: Ambulatory Visit

## 2016-03-24 ENCOUNTER — Ambulatory Visit (HOSPITAL_COMMUNITY)
Admit: 2016-03-24 | Discharge: 2016-03-24 | Disposition: A | Discharge status: Home / Self Care | Payer: Medicaid Other | Attending: Adult Health | Admitting: Adult Health

## 2016-03-24 ENCOUNTER — Emergency Department (HOSPITAL_COMMUNITY)
Admission: EM | Admit: 2016-03-24 | Discharge: 2016-03-24 | Disposition: A | Discharge status: Home / Self Care | Payer: Medicaid Other | Attending: Emergency Medicine | Admitting: Emergency Medicine

## 2016-03-24 DIAGNOSIS — Z931 Gastrostomy status: Secondary | ICD-10-CM

## 2016-03-24 DIAGNOSIS — Z431 Encounter for attention to gastrostomy: Secondary | ICD-10-CM | POA: Insufficient documentation

## 2016-03-24 DIAGNOSIS — Z79899 Other long term (current) drug therapy: Secondary | ICD-10-CM | POA: Diagnosis not present

## 2016-03-24 DIAGNOSIS — Z4659 Encounter for fitting and adjustment of other gastrointestinal appliance and device: Secondary | ICD-10-CM

## 2016-03-24 HISTORY — PX: IR GENERIC HISTORICAL: IMG1180011

## 2016-03-24 IMAGING — DX DG CHEST 1V PORT
1 series · 1 of 1 positions shown · non-contrast
Comparison: 05/29/2015 .

CLINICAL DATA: Respiratory failure.

EXAM:
PORTABLE CHEST 1 VIEW

[chest ap]
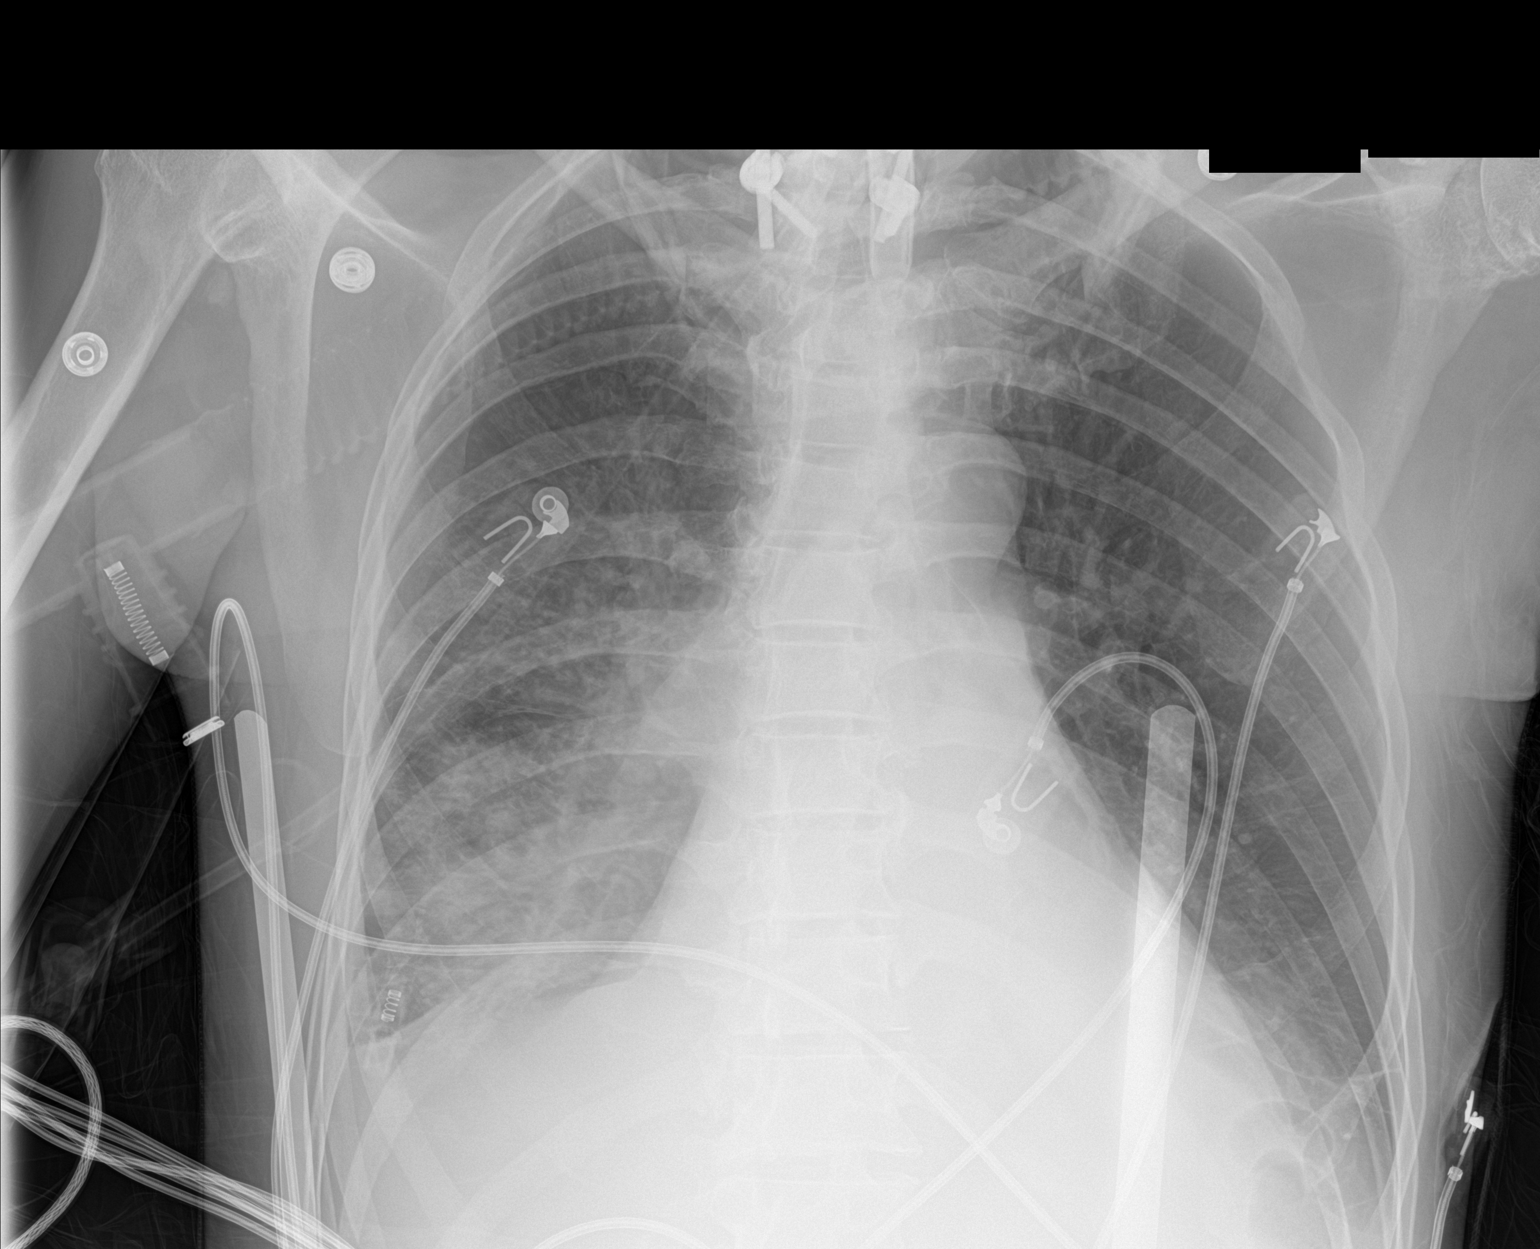

[1 of 1 positions shown; findings below may reference images not displayed]

FINDINGS: Mediastinum hilar structures are normal. Progressive right lower
lobe infiltrate consistent pneumonia noted. Progressive left base
atelectatic changes. Small right pleural effusion. No pneumothorax.
Cardiomegaly. Mild pulmonary venous congestion. A component of
congestive heart failure cannot be excluded . Prior cervical
thoracic spine fusion.
IMPRESSION: 1. Progressive right lower lobe infiltrate consistent with
pneumonia. Progressive atelectasis left lung base. Small right
pleural effusion.
2. Cardiomegaly. Mild pulmonary venous congestion. A component of
congestive heart failure cannot be excluded .

## 2016-03-24 MED ORDER — LIDOCAINE VISCOUS 2 % MT SOLN
OROMUCOSAL | Status: AC
  Filled 2016-03-24: qty 15

## 2016-03-24 MED ORDER — SILVER NITRATE-POT NITRATE 75-25 % EX MISC
CUTANEOUS | Status: AC
  Filled 2016-03-24: qty 1

## 2016-03-24 NOTE — ED Notes (Addendum)
Pt seen leaving department w/ PTAR.  It is assumed the procedure was completed in IR.  Unaware why IR didn't use this encounter and discharge Pt out.

## 2016-03-24 NOTE — ED Notes (Signed)
This Probation officer spoke to [employer] and explained the situation.  POA verbalized understanding.  POA de-escalated.
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
## 2016-03-24 NOTE — ED Triage Notes (Signed)
He seems to have arrived here erroneously. Upon our C.N. Researching, we find pt. Had been scheduled for a procedure at [facility] I.R. Our I.R. Department has agreed to perform the aforementioned procedure here in our I.R. Department, and they are to come to get the pt. Shortly.

## 2016-03-24 NOTE — ED Notes (Signed)
Bed: WA17 Expected date:  Expected time:  Means of arrival:  Comments: EMS- feeding tube issue
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
## 2016-03-26 ENCOUNTER — Other Ambulatory Visit (HOSPITAL_COMMUNITY)
Admission: AD | Admit: 2016-03-26 | Discharge: 2016-03-26 | Disposition: A | Discharge status: Home / Self Care | Payer: 59 | Source: Skilled Nursing Facility | Attending: Family Medicine | Admitting: Family Medicine

## 2016-03-26 DIAGNOSIS — D473 Essential (hemorrhagic) thrombocythemia: Secondary | ICD-10-CM | POA: Insufficient documentation

## 2016-03-26 LAB — AMYLASE: Amylase: 13 U/L — ABNORMAL LOW (ref 28–100)

## 2016-03-26 LAB — COMPREHENSIVE METABOLIC PANEL
ALT: 40 U/L (ref 17–63)
AST: 36 U/L (ref 15–41)
Albumin: 2.8 g/dL — ABNORMAL LOW (ref 3.5–5.0)
Alkaline Phosphatase: 188 U/L — ABNORMAL HIGH (ref 38–126)
Anion gap: 14 (ref 5–15)
BUN: 12 mg/dL (ref 6–20)
CHLORIDE: 92 mmol/L — AB (ref 101–111)
CO2: 24 mmol/L (ref 22–32)
CREATININE: 0.45 mg/dL — AB (ref 0.61–1.24)
Calcium: 10.1 mg/dL (ref 8.9–10.3)
GFR calc non Af Amer: >60 mL/min (ref >60)
Glucose, Bld: 115 mg/dL — ABNORMAL HIGH (ref 65–99)
Potassium: 3.8 mmol/L (ref 3.5–5.1)
SODIUM: 130 mmol/L — AB (ref 135–145)
Total Bilirubin: 0.5 mg/dL (ref 0.3–1.2)
Total Protein: 9.6 g/dL — ABNORMAL HIGH (ref 6.5–8.1)

## 2016-03-26 LAB — CBC WITH DIFFERENTIAL/PLATELET
BASOS ABS: 0 10*3/uL (ref 0.0–0.1)
Basophils Relative: 0 %
EOS PCT: 0 %
Eosinophils Absolute: 0 10*3/uL (ref 0.0–0.7)
HCT: 34.3 % — ABNORMAL LOW (ref 39.0–52.0)
Hemoglobin: 11.3 g/dL — ABNORMAL LOW (ref 13.0–17.0)
Lymphocytes Relative: 11 %
Lymphs Abs: 1.4 10*3/uL (ref 0.7–4.0)
MCH: 23.3 pg — AB (ref 26.0–34.0)
MCHC: 32.9 g/dL (ref 30.0–36.0)
MCV: 70.9 fL — ABNORMAL LOW (ref 78.0–100.0)
Monocytes Absolute: 0.6 10*3/uL (ref 0.1–1.0)
Monocytes Relative: 5 %
Neutro Abs: 10.4 10*3/uL — ABNORMAL HIGH (ref 1.7–7.7)
Neutrophils Relative %: 84 %
PLATELETS: 856 10*3/uL — AB (ref 150–400)
RBC: 4.84 MIL/uL (ref 4.22–5.81)
RDW: 19.3 % — ABNORMAL HIGH (ref 11.5–15.5)
WBC: 12.4 10*3/uL — AB (ref 4.0–10.5)

## 2016-03-26 LAB — TSH: TSH: 0.797 u[IU]/mL (ref 0.350–4.500)

## 2016-03-26 LAB — T4, FREE: FREE T4: 0.97 ng/dL (ref 0.61–1.12)

## 2016-03-26 LAB — LIPASE, BLOOD: LIPASE: 17 U/L (ref 11–51)

## 2016-03-26 IMAGING — CR DG CHEST 1V PORT
2 series · 2 of 2 positions shown · non-contrast
Comparison: 06/01/2015

CLINICAL DATA: Respiratory failure, pneumonia

EXAM:
PORTABLE CHEST 1 VIEW

[AP (1 of 2)]
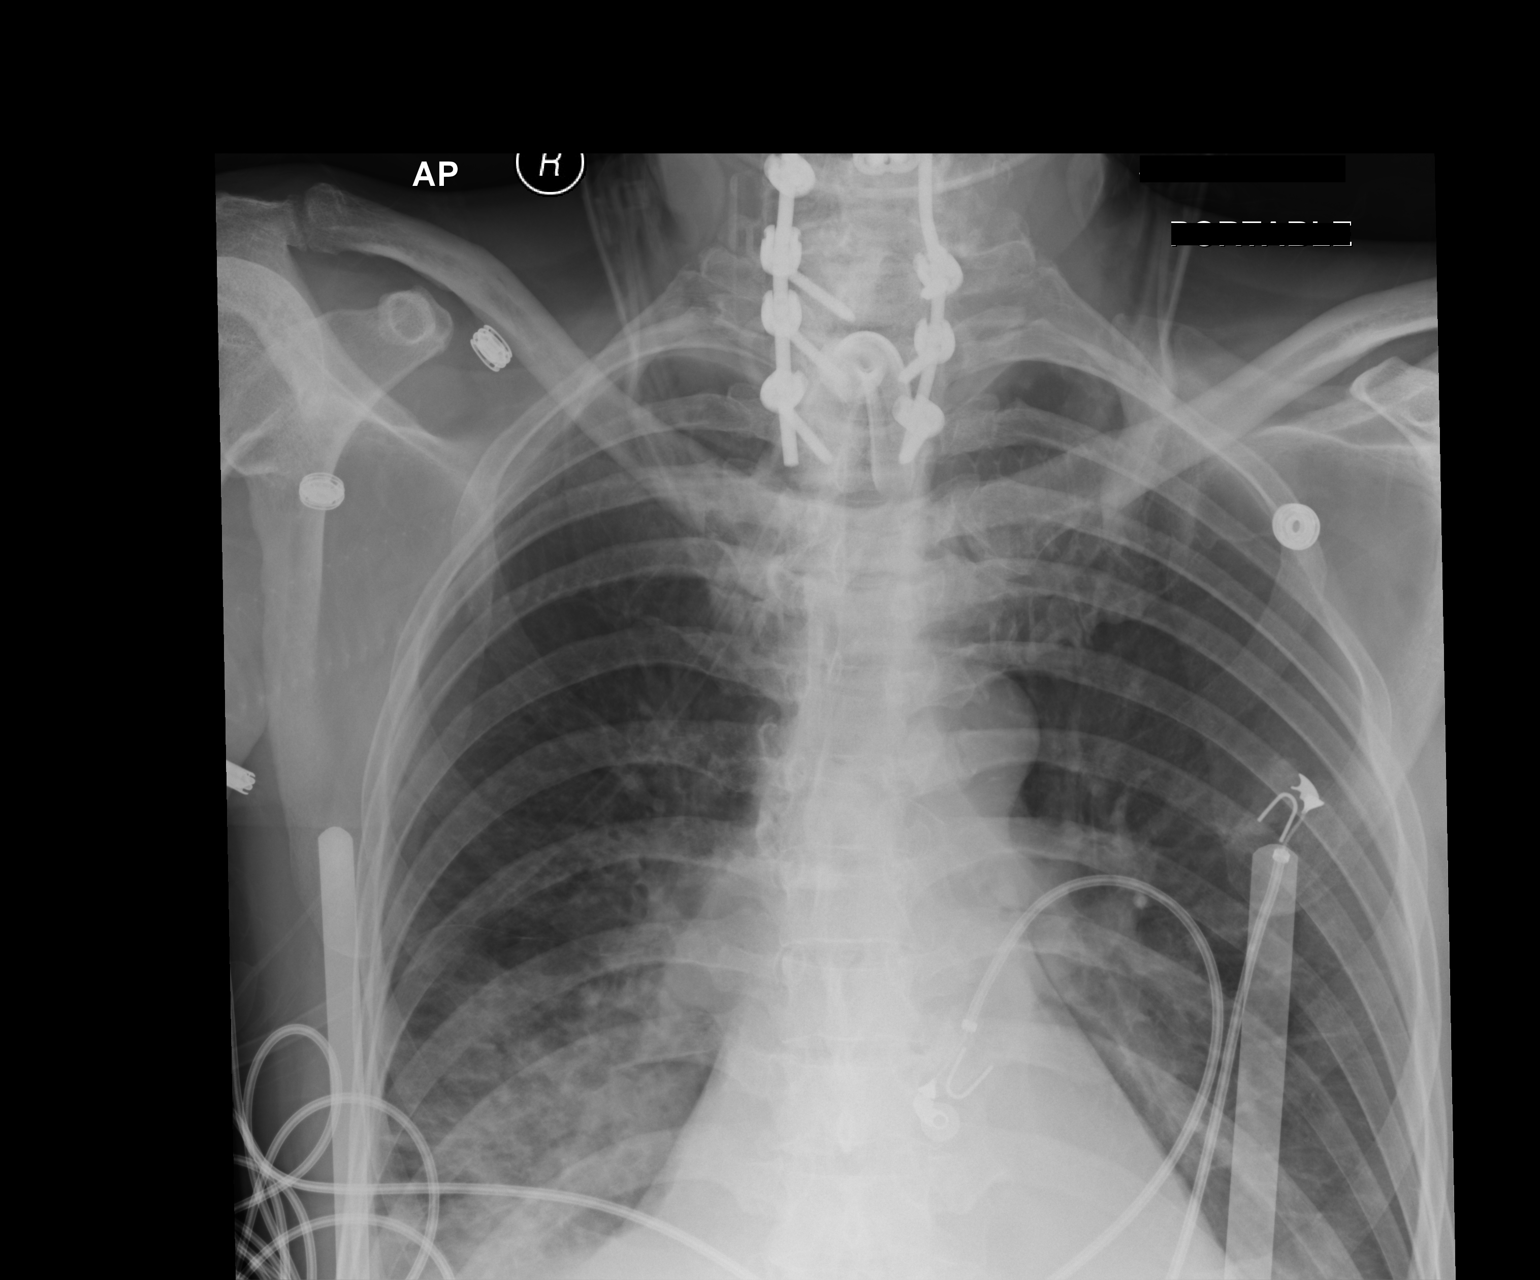

[AP (2 of 2)]
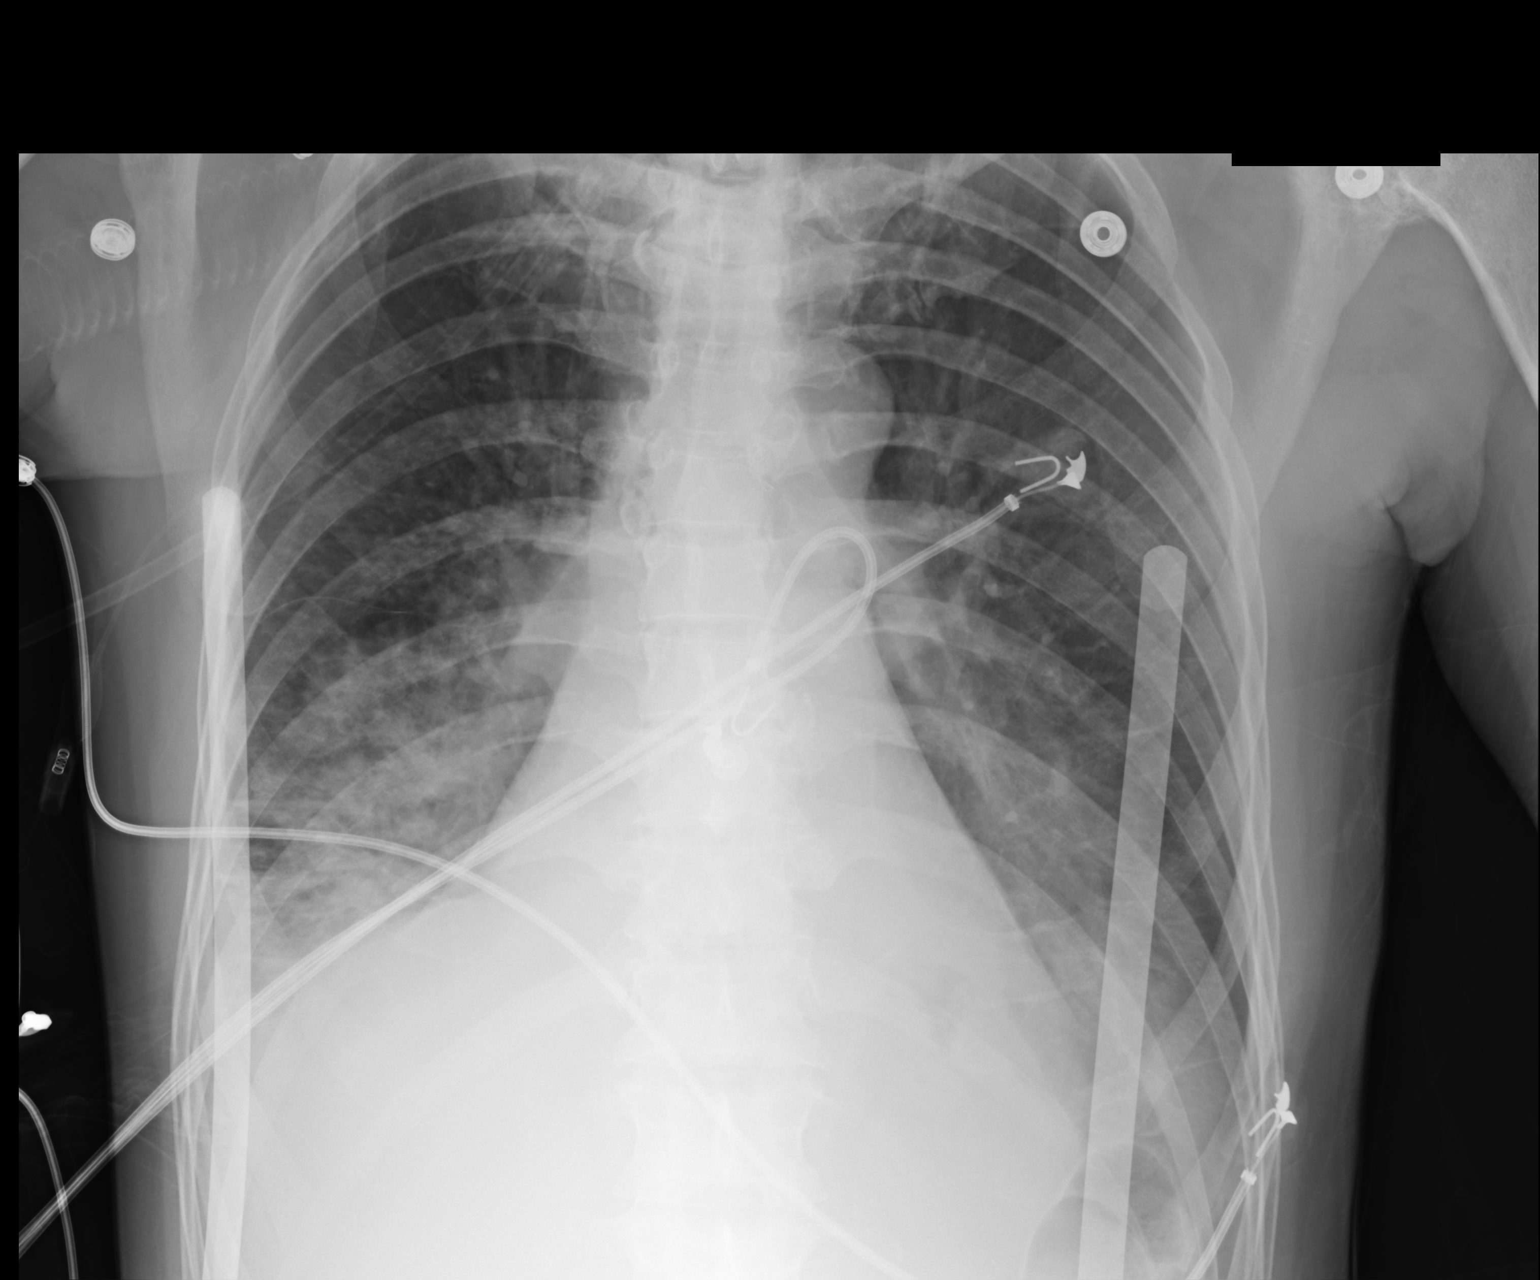

[2 of 2 positions shown; findings below may reference images not displayed]

FINDINGS: Bilateral lower lobe airspace opacities are noted with probable
layering effusions. Mild cardiomegaly. Tracheostomy tube is
unchanged.
IMPRESSION: No significant change in bilateral lower lobe airspace opacities and
layering effusions.

## 2016-03-27 ENCOUNTER — Encounter (HOSPITAL_COMMUNITY): Payer: Self-pay | Admitting: Emergency Medicine

## 2016-03-27 ENCOUNTER — Emergency Department (HOSPITAL_COMMUNITY)
Admission: EM | Admit: 2016-03-27 | Discharge: 2016-03-27 | Disposition: A | Discharge status: Home / Self Care | Payer: Medicaid Other | Attending: Emergency Medicine | Admitting: Emergency Medicine

## 2016-03-27 DIAGNOSIS — N390 Urinary tract infection, site not specified: Secondary | ICD-10-CM | POA: Insufficient documentation

## 2016-03-27 DIAGNOSIS — J9610 Chronic respiratory failure, unspecified whether with hypoxia or hypercapnia: Secondary | ICD-10-CM | POA: Insufficient documentation

## 2016-03-27 DIAGNOSIS — Z79899 Other long term (current) drug therapy: Secondary | ICD-10-CM | POA: Diagnosis not present

## 2016-03-27 DIAGNOSIS — D473 Essential (hemorrhagic) thrombocythemia: Secondary | ICD-10-CM | POA: Diagnosis not present

## 2016-03-27 DIAGNOSIS — D75839 Thrombocytosis, unspecified: Secondary | ICD-10-CM

## 2016-03-27 DIAGNOSIS — R799 Abnormal finding of blood chemistry, unspecified: Secondary | ICD-10-CM | POA: Diagnosis present

## 2016-03-27 LAB — BASIC METABOLIC PANEL
ANION GAP: 13 (ref 5–15)
BUN: 16 mg/dL (ref 6–20)
CHLORIDE: 99 mmol/L — AB (ref 101–111)
CO2: 23 mmol/L (ref 22–32)
Calcium: 9.7 mg/dL (ref 8.9–10.3)
Creatinine, Ser: 0.42 mg/dL — ABNORMAL LOW (ref 0.61–1.24)
GFR calc non Af Amer: >60 mL/min (ref >60)
Glucose, Bld: 85 mg/dL (ref 65–99)
Potassium: 3.8 mmol/L (ref 3.5–5.1)
Sodium: 135 mmol/L (ref 135–145)

## 2016-03-27 LAB — URINALYSIS, ROUTINE W REFLEX MICROSCOPIC
Bilirubin Urine: NEGATIVE
Glucose, UA: NEGATIVE mg/dL
Ketones, ur: NEGATIVE mg/dL
NITRITE: POSITIVE — AB
PH: 7 (ref 5.0–8.0)
Protein, ur: >300 mg/dL — AB
SPECIFIC GRAVITY, URINE: 1.025 (ref 1.005–1.030)

## 2016-03-27 LAB — CBC WITH DIFFERENTIAL/PLATELET
BASOS PCT: 0 %
Basophils Absolute: 0 10*3/uL (ref 0.0–0.1)
Eosinophils Absolute: 0.1 10*3/uL (ref 0.0–0.7)
Eosinophils Relative: 1 %
HEMATOCRIT: 32.7 % — AB (ref 39.0–52.0)
HEMOGLOBIN: 10.3 g/dL — AB (ref 13.0–17.0)
LYMPHS PCT: 13 %
Lymphs Abs: 1.5 10*3/uL (ref 0.7–4.0)
MCH: 22.8 pg — ABNORMAL LOW (ref 26.0–34.0)
MCHC: 31.5 g/dL (ref 30.0–36.0)
MCV: 72.3 fL — AB (ref 78.0–100.0)
MONOS PCT: 6 %
Monocytes Absolute: 0.7 10*3/uL (ref 0.1–1.0)
NEUTROS ABS: 9.7 10*3/uL — AB (ref 1.7–7.7)
NEUTROS PCT: 80 %
Platelets: 867 10*3/uL — ABNORMAL HIGH (ref 150–400)
RBC: 4.52 MIL/uL (ref 4.22–5.81)
RDW: 19.3 % — ABNORMAL HIGH (ref 11.5–15.5)
WBC: 12.1 10*3/uL — ABNORMAL HIGH (ref 4.0–10.5)

## 2016-03-27 LAB — URINALYSIS, MICROSCOPIC (REFLEX)

## 2016-03-27 MED ORDER — NITROFURANTOIN MONOHYD MACRO 100 MG PO CAPS: 100.0000 mg | ORAL_CAPSULE | Freq: Two times a day (BID) | ORAL | 0 refills | Status: DC

## 2016-03-27 MED ORDER — NITROFURANTOIN MONOHYD MACRO 100 MG PO CAPS
100.0000 mg | ORAL_CAPSULE | Freq: Once | ORAL | Status: AC
  Administered 2016-03-27: 100 mg via ORAL
  Filled 2016-03-27: qty 1
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
## 2016-03-27 NOTE — Discharge Instructions (Signed)
Your platelets are likely elevated because of a urinary tract infection.  Start taking the antibiotic medication, tomorrow morning.  Make sure that you're eating and drinking regularly.
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
## 2016-03-28 ENCOUNTER — Other Ambulatory Visit (HOSPITAL_COMMUNITY)
Admission: AD | Admit: 2016-03-28 | Discharge: 2016-03-28 | Disposition: A | Discharge status: Home / Self Care | Payer: Medicaid Other | Source: Other Acute Inpatient Hospital | Attending: Internal Medicine | Admitting: Internal Medicine

## 2016-03-28 DIAGNOSIS — N390 Urinary tract infection, site not specified: Secondary | ICD-10-CM | POA: Insufficient documentation

## 2016-03-28 DIAGNOSIS — L89154 Pressure ulcer of sacral region, stage 4: Secondary | ICD-10-CM | POA: Insufficient documentation

## 2016-03-28 LAB — URINALYSIS, MICROSCOPIC (REFLEX)

## 2016-03-28 LAB — URINALYSIS, ROUTINE W REFLEX MICROSCOPIC
BILIRUBIN URINE: NEGATIVE
Glucose, UA: NEGATIVE mg/dL
Ketones, ur: NEGATIVE mg/dL
NITRITE: POSITIVE — AB
PH: 8.5 — AB (ref 5.0–8.0)
Protein, ur: >300 mg/dL — AB
SPECIFIC GRAVITY, URINE: 1.02 (ref 1.005–1.030)

## 2016-03-29 LAB — URINE CULTURE

## 2016-03-31 LAB — AEROBIC CULTURE W GRAM STAIN (SUPERFICIAL SPECIMEN)

## 2016-03-31 LAB — AEROBIC CULTURE  (SUPERFICIAL SPECIMEN)

## 2016-04-01 LAB — H. PYLORI ANTIBODY, IGG: H Pylori IgG: 5 U/mL — ABNORMAL HIGH (ref 0.0–0.8)

## 2016-04-01 IMAGING — DX DG CHEST 1V PORT
1 series · 1 of 1 positions shown · non-contrast
Comparison: June 04, 2015

CLINICAL DATA: Respiratory failure

EXAM:
PORTABLE CHEST 1 VIEW

[chest ap]
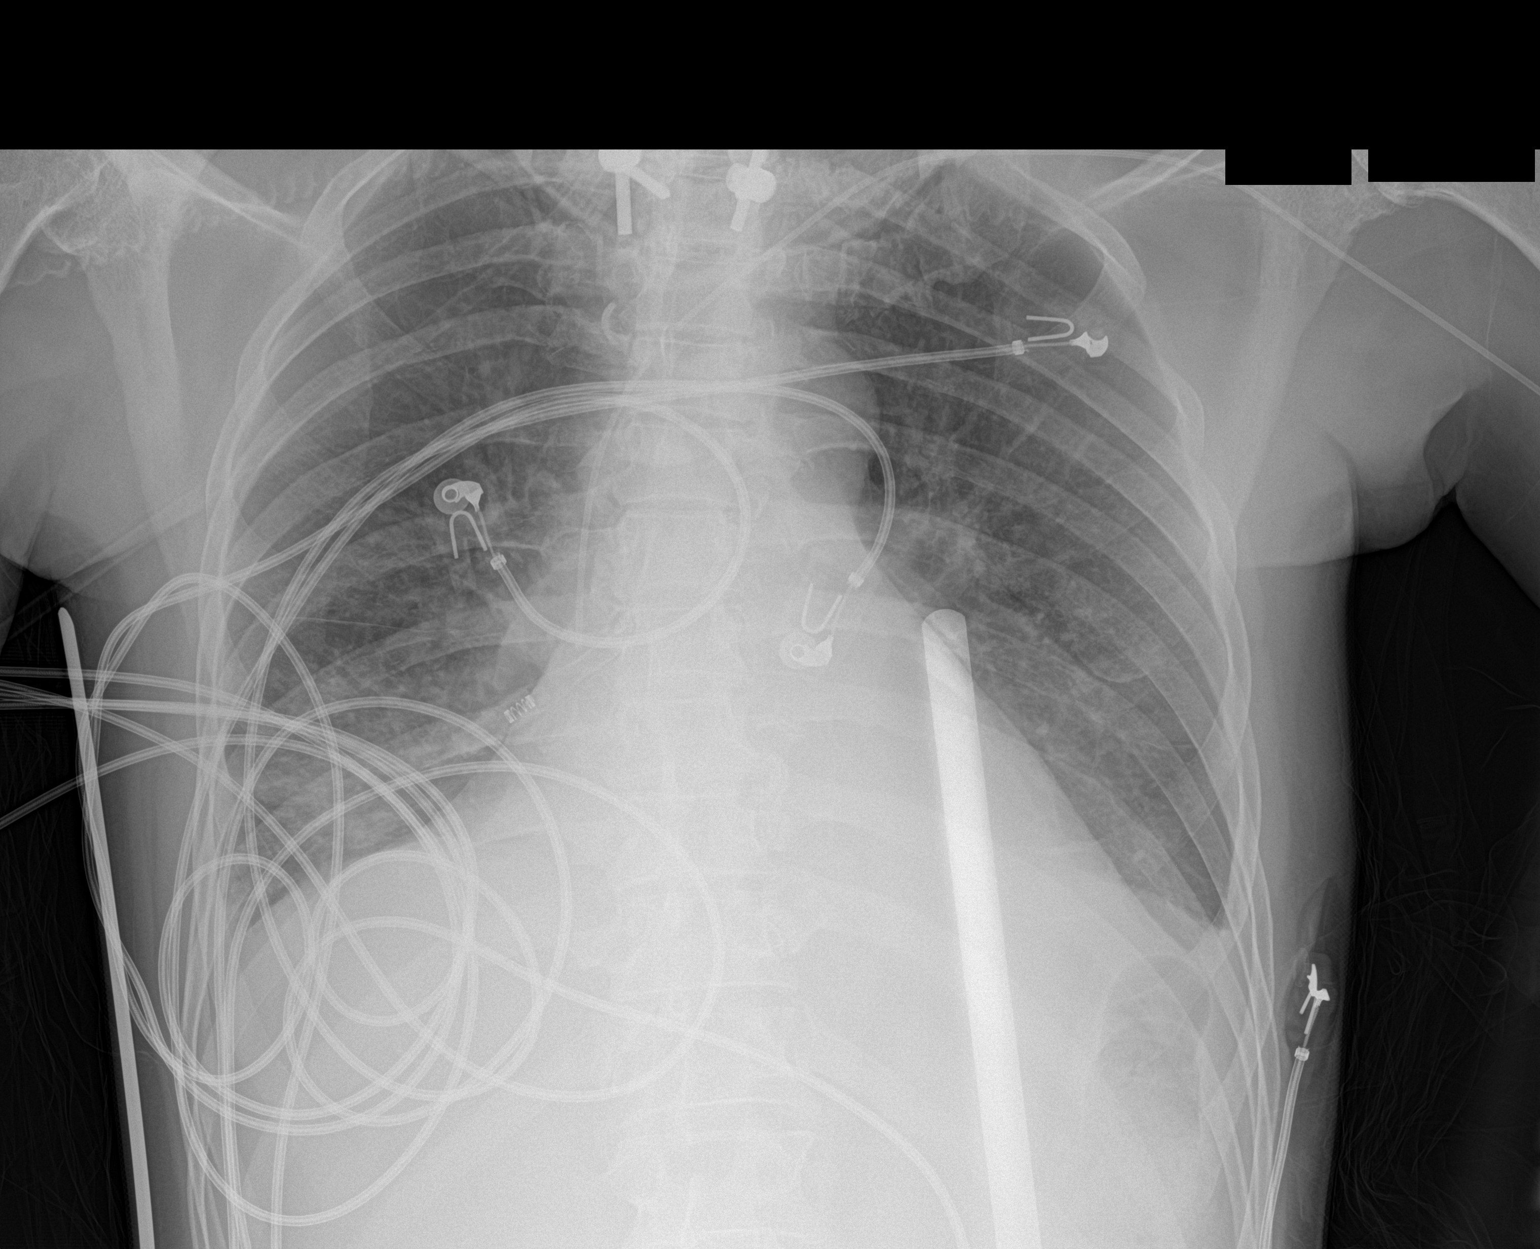

[1 of 1 positions shown; findings below may reference images not displayed]

FINDINGS: No pneumothorax. A left PICC line is in good position. Stable
cardiomegaly. Bilateral pleural effusions with underlying
atelectasis are stable. Mild edema suspected. No other interval
changes or acute abnormalities.
IMPRESSION: Small effusions and mild edema.

## 2016-06-09 ENCOUNTER — Other Ambulatory Visit (HOSPITAL_COMMUNITY)
Admission: AD | Admit: 2016-06-09 | Discharge: 2016-06-09 | Disposition: A | Discharge status: Home / Self Care | Payer: Medicaid Other | Source: Skilled Nursing Facility | Attending: Internal Medicine | Admitting: Internal Medicine

## 2016-06-09 DIAGNOSIS — D473 Essential (hemorrhagic) thrombocythemia: Secondary | ICD-10-CM | POA: Diagnosis not present

## 2016-06-09 LAB — CBC WITH DIFFERENTIAL/PLATELET
BASOS ABS: 0 10*3/uL (ref 0.0–0.1)
Basophils Relative: 1 %
Eosinophils Absolute: 0.3 10*3/uL (ref 0.0–0.7)
Eosinophils Relative: 3 %
HEMATOCRIT: 39.5 % (ref 39.0–52.0)
HEMOGLOBIN: 12.6 g/dL — AB (ref 13.0–17.0)
LYMPHS ABS: 1.5 10*3/uL (ref 0.7–4.0)
LYMPHS PCT: 17 %
MCH: 24.5 pg — AB (ref 26.0–34.0)
MCHC: 31.9 g/dL (ref 30.0–36.0)
MCV: 76.8 fL — AB (ref 78.0–100.0)
Monocytes Absolute: 0.6 10*3/uL (ref 0.1–1.0)
Monocytes Relative: 7 %
NEUTROS ABS: 6.5 10*3/uL (ref 1.7–7.7)
Neutrophils Relative %: 73 %
Platelets: 426 10*3/uL — ABNORMAL HIGH (ref 150–400)
RBC: 5.14 MIL/uL (ref 4.22–5.81)
RDW: 19.8 % — ABNORMAL HIGH (ref 11.5–15.5)
WBC: 8.9 10*3/uL (ref 4.0–10.5)

## 2016-07-04 ENCOUNTER — Other Ambulatory Visit (HOSPITAL_COMMUNITY)
Admission: AD | Admit: 2016-07-04 | Discharge: 2016-07-04 | Disposition: A | Discharge status: Home / Self Care | Payer: Medicaid Other | Source: Other Acute Inpatient Hospital | Attending: Internal Medicine | Admitting: Internal Medicine

## 2016-07-04 DIAGNOSIS — N390 Urinary tract infection, site not specified: Secondary | ICD-10-CM | POA: Insufficient documentation

## 2016-07-04 LAB — URINALYSIS, ROUTINE W REFLEX MICROSCOPIC
Bilirubin Urine: NEGATIVE
Glucose, UA: NEGATIVE mg/dL
Ketones, ur: NEGATIVE mg/dL
NITRITE: POSITIVE — AB
PH: 7 (ref 5.0–8.0)
PROTEIN: 100 mg/dL — AB
Specific Gravity, Urine: 1.02 (ref 1.005–1.030)

## 2016-07-04 LAB — URINALYSIS, MICROSCOPIC (REFLEX)

## 2016-07-06 LAB — URINE CULTURE

## 2016-07-09 ENCOUNTER — Ambulatory Visit (HOSPITAL_COMMUNITY): Payer: Self-pay

## 2016-07-17 ENCOUNTER — Inpatient Hospital Stay (HOSPITAL_COMMUNITY): Admission: RE | Admit: 2016-07-17 | Payer: Self-pay | Source: Ambulatory Visit

## 2016-07-17 ENCOUNTER — Emergency Department (HOSPITAL_COMMUNITY)
Admission: EM | Admit: 2016-07-17 | Discharge: 2016-07-17 | Disposition: A | Discharge status: Home / Self Care | Payer: Medicaid Other | Attending: Emergency Medicine | Admitting: Emergency Medicine

## 2016-07-17 ENCOUNTER — Encounter (HOSPITAL_COMMUNITY): Payer: Self-pay

## 2016-07-17 DIAGNOSIS — Z930 Tracheostomy status: Secondary | ICD-10-CM | POA: Insufficient documentation

## 2016-07-17 DIAGNOSIS — M542 Cervicalgia: Secondary | ICD-10-CM | POA: Insufficient documentation

## 2016-07-17 DIAGNOSIS — L97904 Non-pressure chronic ulcer of unspecified part of unspecified lower leg with necrosis of bone: Secondary | ICD-10-CM

## 2016-07-17 DIAGNOSIS — L97924 Non-pressure chronic ulcer of unspecified part of left lower leg with necrosis of bone: Secondary | ICD-10-CM | POA: Insufficient documentation
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
## 2016-07-17 NOTE — ED Notes (Addendum)
Pt to be transported via [facility]. Pt D/C paperwork given to family.

## 2016-09-29 ENCOUNTER — Telehealth: Payer: Self-pay | Admitting: Acute Care

## 2016-09-29 ENCOUNTER — Telehealth: Payer: Self-pay | Admitting: *Deleted

## 2016-09-29 DIAGNOSIS — Z430 Encounter for attention to tracheostomy: Secondary | ICD-10-CM

## 2016-09-29 NOTE — Telephone Encounter (Signed)
Referral has been placed per [facility]. Nothing further was needed.

## 2016-09-29 NOTE — Telephone Encounter (Signed)
-----   Message from [name], NP sent at 09/29/2016  2:15 PM EDT ----- Regarding: needs ENT consult  Hey guys, Can you guys please set up a referral to Dr Arville Care w/ ENT. Reason for consult: trachestomy stoma non-closure  Referring: Me  The patient's number is [phone]  He will need to be called with the appointment.   Thanks!  Laurey Arrow

## 2016-09-29 NOTE — Telephone Encounter (Deleted)
-----   Message from [name], NP sent at 09/29/2016  2:15 PM EDT ----- Regarding: needs ENT consult  Hey guys, Can you guys please set up a referral to Dr Arville Care w/ ENT. Reason for consult: trachestomy stoma non-closure  Referring: Me  The patient's number is [phone]  He will need to be called with the appointment.   Thanks!  Laurey Arrow

## 2016-10-13 ENCOUNTER — Other Ambulatory Visit (HOSPITAL_COMMUNITY)
Admission: AD | Admit: 2016-10-13 | Discharge: 2016-10-13 | Disposition: A | Discharge status: Home / Self Care | Payer: Medicaid Other | Source: Skilled Nursing Facility | Attending: Internal Medicine | Admitting: Internal Medicine

## 2016-10-13 DIAGNOSIS — D649 Anemia, unspecified: Secondary | ICD-10-CM | POA: Insufficient documentation

## 2016-10-13 LAB — CBC WITH DIFFERENTIAL/PLATELET
BASOS ABS: 0.1 10*3/uL (ref 0.0–0.1)
Basophils Relative: 1 %
Eosinophils Absolute: 0.5 10*3/uL (ref 0.0–0.7)
Eosinophils Relative: 7 %
HEMATOCRIT: 32.3 % — AB (ref 39.0–52.0)
Hemoglobin: 10.3 g/dL — ABNORMAL LOW (ref 13.0–17.0)
LYMPHS ABS: 1.4 10*3/uL (ref 0.7–4.0)
LYMPHS PCT: 19 %
MCH: 24.6 pg — ABNORMAL LOW (ref 26.0–34.0)
MCHC: 31.9 g/dL (ref 30.0–36.0)
MCV: 77.3 fL — AB (ref 78.0–100.0)
MONO ABS: 0.8 10*3/uL (ref 0.1–1.0)
Monocytes Relative: 10 %
NEUTROS ABS: 4.8 10*3/uL (ref 1.7–7.7)
Neutrophils Relative %: 63 %
Platelets: 552 10*3/uL — ABNORMAL HIGH (ref 150–400)
RBC: 4.18 MIL/uL — ABNORMAL LOW (ref 4.22–5.81)
RDW: 16.6 % — AB (ref 11.5–15.5)
WBC: 7.5 10*3/uL (ref 4.0–10.5)

## 2016-10-13 LAB — COMPREHENSIVE METABOLIC PANEL
ALT: 11 U/L — AB (ref 17–63)
AST: 17 U/L (ref 15–41)
Albumin: 3.2 g/dL — ABNORMAL LOW (ref 3.5–5.0)
Alkaline Phosphatase: 101 U/L (ref 38–126)
Anion gap: 9 (ref 5–15)
BUN: 13 mg/dL (ref 6–20)
CHLORIDE: 99 mmol/L — AB (ref 101–111)
CO2: 27 mmol/L (ref 22–32)
CREATININE: 0.42 mg/dL — AB (ref 0.61–1.24)
Calcium: 9.2 mg/dL (ref 8.9–10.3)
GFR calc non Af Amer: >60 mL/min (ref >60)
Glucose, Bld: 78 mg/dL (ref 65–99)
Potassium: 4.4 mmol/L (ref 3.5–5.1)
Sodium: 135 mmol/L (ref 135–145)
Total Bilirubin: 0.2 mg/dL — ABNORMAL LOW (ref 0.3–1.2)
Total Protein: 7.2 g/dL (ref 6.5–8.1)

## 2016-10-13 LAB — LIPID PANEL
Cholesterol: 137 mg/dL (ref 0–200)
HDL: 31 mg/dL — AB (ref >40)
LDL CALC: 76 mg/dL (ref 0–99)
Total CHOL/HDL Ratio: 4.4 RATIO
Triglycerides: 152 mg/dL — ABNORMAL HIGH (ref <150)
VLDL: 30 mg/dL (ref 0–40)

## 2016-10-14 LAB — HEMOGLOBIN A1C
HEMOGLOBIN A1C: 5.3 % (ref 4.8–5.6)
MEAN PLASMA GLUCOSE: 105 mg/dL

## 2016-10-14 LAB — VITAMIN D 25 HYDROXY (VIT D DEFICIENCY, FRACTURES): Vit D, 25-Hydroxy: 20.7 ng/mL — ABNORMAL LOW (ref 30.0–100.0)

## 2016-10-14 LAB — TSH: TSH: 0.855 u[IU]/mL (ref 0.350–4.500)

## 2016-10-14 LAB — VITAMIN B12: VITAMIN B 12: 542 pg/mL (ref 180–914)

## 2016-11-01 ENCOUNTER — Encounter (HOSPITAL_COMMUNITY): Payer: Self-pay | Admitting: *Deleted
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
## 2016-11-07 ENCOUNTER — Inpatient Hospital Stay (HOSPITAL_COMMUNITY): Payer: Medicaid Other

## 2016-11-07 ENCOUNTER — Inpatient Hospital Stay (HOSPITAL_COMMUNITY): Payer: Medicaid Other | Admitting: Emergency Medicine

## 2016-11-07 ENCOUNTER — Inpatient Hospital Stay (HOSPITAL_COMMUNITY)
Admission: RE | Admit: 2016-11-07 | Discharge: 2016-11-09 | DRG: 163 | Disposition: A | Discharge status: Home / Self Care | Payer: Medicaid Other | Attending: Otolaryngology | Admitting: Otolaryngology

## 2016-11-07 ENCOUNTER — Encounter (HOSPITAL_COMMUNITY): Payer: Self-pay | Admitting: *Deleted

## 2016-11-07 ENCOUNTER — Encounter (HOSPITAL_COMMUNITY)
Admission: RE | Disposition: A | Discharge status: Home / Self Care | Payer: Self-pay | Source: Home / Self Care | Attending: Otolaryngology

## 2016-11-07 DIAGNOSIS — F419 Anxiety disorder, unspecified: Secondary | ICD-10-CM | POA: Diagnosis not present

## 2016-11-07 DIAGNOSIS — G8250 Quadriplegia, unspecified: Secondary | ICD-10-CM | POA: Diagnosis not present

## 2016-11-07 DIAGNOSIS — J9601 Acute respiratory failure with hypoxia: Secondary | ICD-10-CM | POA: Diagnosis not present

## 2016-11-07 DIAGNOSIS — K219 Gastro-esophageal reflux disease without esophagitis: Secondary | ICD-10-CM | POA: Diagnosis not present

## 2016-11-07 DIAGNOSIS — D649 Anemia, unspecified: Secondary | ICD-10-CM | POA: Diagnosis not present

## 2016-11-07 DIAGNOSIS — J9504 Tracheo-esophageal fistula following tracheostomy: Secondary | ICD-10-CM

## 2016-11-07 DIAGNOSIS — J9509 Other tracheostomy complication: Principal | ICD-10-CM | POA: Diagnosis present

## 2016-11-07 DIAGNOSIS — M1991 Primary osteoarthritis, unspecified site: Secondary | ICD-10-CM | POA: Diagnosis present

## 2016-11-07 DIAGNOSIS — N200 Calculus of kidney: Secondary | ICD-10-CM

## 2016-11-07 DIAGNOSIS — S14109D Unspecified injury at unspecified level of cervical spinal cord, subsequent encounter: Secondary | ICD-10-CM

## 2016-11-07 HISTORY — DX: Presence of other specified devices: Z97.8

## 2016-11-07 HISTORY — DX: Pressure ulcer of unspecified site, unspecified stage: L89.90

## 2016-11-07 HISTORY — DX: Anemia, unspecified: D64.9

## 2016-11-07 HISTORY — DX: Personal history of other diseases of the respiratory system: Z87.09

## 2016-11-07 HISTORY — DX: Personal history of other diseases of the nervous system and sense organs: Z86.69

## 2016-11-07 HISTORY — DX: Presence of urogenital implants: Z96.0

## 2016-11-07 HISTORY — DX: Anxiety disorder, unspecified: F41.9

## 2016-11-07 HISTORY — DX: Pyothorax with fistula: J86.0

## 2016-11-07 HISTORY — PX: TRACHEOESOPHAGEAL FISTULA REPAIR: SHX2557

## 2016-11-07 HISTORY — DX: Unspecified fracture of sternum, initial encounter for closed fracture: S22.20XA

## 2016-11-07 HISTORY — DX: Personal history of urinary calculi: Z87.442

## 2016-11-07 HISTORY — DX: Failed or difficult intubation, initial encounter: T88.4XXA

## 2016-11-07 HISTORY — DX: Unspecified osteoarthritis, unspecified site: M19.90

## 2016-11-07 HISTORY — DX: Fracture of neck, unspecified, initial encounter: S12.9XXA

## 2016-11-07 HISTORY — DX: Gastro-esophageal reflux disease without esophagitis: K21.9

## 2016-11-07 LAB — COMPREHENSIVE METABOLIC PANEL
ALBUMIN: 3.2 g/dL — AB (ref 3.5–5.0)
ALK PHOS: 88 U/L (ref 38–126)
ALT: 12 U/L — ABNORMAL LOW (ref 17–63)
ANION GAP: 8 (ref 5–15)
AST: 31 U/L (ref 15–41)
BUN: 12 mg/dL (ref 6–20)
CALCIUM: 9.4 mg/dL (ref 8.9–10.3)
CHLORIDE: 107 mmol/L (ref 101–111)
CO2: 22 mmol/L (ref 22–32)
Creatinine, Ser: 0.44 mg/dL — ABNORMAL LOW (ref 0.61–1.24)
GFR calc Af Amer: >60 mL/min (ref >60)
GFR calc non Af Amer: >60 mL/min (ref >60)
GLUCOSE: 87 mg/dL (ref 65–99)
Potassium: 5.2 mmol/L — ABNORMAL HIGH (ref 3.5–5.1)
SODIUM: 137 mmol/L (ref 135–145)
Total Bilirubin: 0.8 mg/dL (ref 0.3–1.2)
Total Protein: 6.9 g/dL (ref 6.5–8.1)

## 2016-11-07 LAB — POCT I-STAT 3, ART BLOOD GAS (G3+)
Acid-Base Excess: 4 mmol/L — ABNORMAL HIGH (ref 0.0–2.0)
Bicarbonate: 26.6 mmol/L (ref 20.0–28.0)
O2 SAT: 100 %
TCO2: 28 mmol/L (ref 0–100)
pCO2 arterial: 32.3 mmHg (ref 32.0–48.0)
pH, Arterial: 7.521 — ABNORMAL HIGH (ref 7.350–7.450)
pO2, Arterial: 299 mmHg — ABNORMAL HIGH (ref 83.0–108.0)

## 2016-11-07 LAB — URINALYSIS, ROUTINE W REFLEX MICROSCOPIC
Bilirubin Urine: NEGATIVE
Glucose, UA: NEGATIVE mg/dL
Ketones, ur: NEGATIVE mg/dL
Nitrite: POSITIVE — AB
Protein, ur: 100 mg/dL — AB
Specific Gravity, Urine: 1.013 (ref 1.005–1.030)
Squamous Epithelial / LPF: NONE SEEN
pH: 9 — ABNORMAL HIGH (ref 5.0–8.0)

## 2016-11-07 LAB — CBC
HCT: 35.3 % — ABNORMAL LOW (ref 39.0–52.0)
HEMOGLOBIN: 11.7 g/dL — AB (ref 13.0–17.0)
MCH: 25.8 pg — ABNORMAL LOW (ref 26.0–34.0)
MCHC: 33.1 g/dL (ref 30.0–36.0)
MCV: 77.8 fL — ABNORMAL LOW (ref 78.0–100.0)
PLATELETS: 351 10*3/uL (ref 150–400)
RBC: 4.54 MIL/uL (ref 4.22–5.81)
RDW: 17.1 % — ABNORMAL HIGH (ref 11.5–15.5)
WBC: 5.1 10*3/uL (ref 4.0–10.5)

## 2016-11-07 LAB — SURGICAL PCR SCREEN
MRSA, PCR: NEGATIVE
STAPHYLOCOCCUS AUREUS: POSITIVE — AB

## 2016-11-07 LAB — GLUCOSE, CAPILLARY: GLUCOSE-CAPILLARY: 90 mg/dL (ref 65–99)

## 2016-11-07 SURGERY — TRACHEAL ESOPHAGEAL FISTULA REPAIR
Anesthesia: General | Site: Neck

## 2016-11-07 MED ORDER — 0.9 % SODIUM CHLORIDE (POUR BTL) OPTIME
TOPICAL | Status: DC | PRN
  Administered 2016-11-07: 1000 mL

## 2016-11-07 MED ORDER — DEXTROSE-NACL 5-0.45 % IV SOLN
INTRAVENOUS | Status: DC
  Administered 2016-11-07: 13:00:00 via INTRAVENOUS
  Administered 2016-11-08: 1 mL via INTRAVENOUS
  Administered 2016-11-08: 04:00:00 via INTRAVENOUS

## 2016-11-07 MED ORDER — LIDOCAINE-EPINEPHRINE 1 %-1:100000 IJ SOLN
INTRAMUSCULAR | Status: DC | PRN
  Administered 2016-11-07: 1 mL

## 2016-11-07 MED ORDER — FENTANYL CITRATE (PF) 100 MCG/2ML IJ SOLN
100.0000 ug | INTRAMUSCULAR | Status: DC | PRN
  Administered 2016-11-07: 100 ug via INTRAVENOUS
  Filled 2016-11-07: qty 2

## 2016-11-07 MED ORDER — SERTRALINE HCL 50 MG PO TABS
50.0000 mg | ORAL_TABLET | Freq: Every day | ORAL | Status: DC
  Filled 2016-11-07 (×2): qty 1

## 2016-11-07 MED ORDER — PROPOFOL 10 MG/ML IV BOLUS
INTRAVENOUS | Status: AC
  Filled 2016-11-07: qty 20

## 2016-11-07 MED ORDER — FENTANYL CITRATE (PF) 100 MCG/2ML IJ SOLN
INTRAMUSCULAR | Status: DC | PRN
  Administered 2016-11-07: 100 ug via INTRAVENOUS

## 2016-11-07 MED ORDER — PROPOFOL 1000 MG/100ML IV EMUL
INTRAVENOUS | Status: AC
  Filled 2016-11-07: qty 100

## 2016-11-07 MED ORDER — BACITRACIN ZINC 500 UNIT/GM EX OINT
TOPICAL_OINTMENT | CUTANEOUS | Status: AC
  Filled 2016-11-07: qty 28.35

## 2016-11-07 MED ORDER — ONDANSETRON HCL 4 MG/2ML IJ SOLN
INTRAMUSCULAR | Status: DC | PRN
  Administered 2016-11-07: 4 mg via INTRAVENOUS

## 2016-11-07 MED ORDER — PROPOFOL 10 MG/ML IV BOLUS
INTRAVENOUS | Status: DC | PRN
  Administered 2016-11-07: 150 mg via INTRAVENOUS

## 2016-11-07 MED ORDER — PROPOFOL 1000 MG/100ML IV EMUL
0.0000 ug/kg/min | INTRAVENOUS | Status: DC
  Administered 2016-11-07: 40 ug/kg/min via INTRAVENOUS
  Administered 2016-11-07: 60 ug/kg/min via INTRAVENOUS
  Filled 2016-11-07 (×2): qty 100

## 2016-11-07 MED ORDER — FENTANYL CITRATE (PF) 250 MCG/5ML IJ SOLN
INTRAMUSCULAR | Status: AC
Start: 2016-11-07 — End: 2016-11-07
  Filled 2016-11-07: qty 5

## 2016-11-07 MED ORDER — CLINDAMYCIN PHOSPHATE 900 MG/50ML IV SOLN
900.0000 mg | INTRAVENOUS | Status: AC
  Administered 2016-11-07: 900 mg via INTRAVENOUS
  Filled 2016-11-07: qty 50

## 2016-11-07 MED ORDER — CHLORHEXIDINE GLUCONATE 0.12% ORAL RINSE (MEDLINE KIT)
15.0000 mL | Freq: Two times a day (BID) | OROMUCOSAL | Status: DC
  Administered 2016-11-07 – 2016-11-08 (×2): 15 mL via OROMUCOSAL

## 2016-11-07 MED ORDER — ROCURONIUM BROMIDE 100 MG/10ML IV SOLN
INTRAVENOUS | Status: DC | PRN
  Administered 2016-11-07: 40 mg via INTRAVENOUS

## 2016-11-07 MED ORDER — HEPARIN SODIUM (PORCINE) 5000 UNIT/ML IJ SOLN
5000.0000 [IU] | Freq: Three times a day (TID) | INTRAMUSCULAR | Status: DC
  Administered 2016-11-07 – 2016-11-09 (×6): 5000 [IU] via SUBCUTANEOUS
  Filled 2016-11-07 (×6): qty 1

## 2016-11-07 MED ORDER — INSULIN ASPART 100 UNIT/ML ~~LOC~~ SOLN: 0.0000 [IU] | SUBCUTANEOUS | Status: DC

## 2016-11-07 MED ORDER — FENTANYL CITRATE (PF) 100 MCG/2ML IJ SOLN
100.0000 ug | INTRAMUSCULAR | Status: DC | PRN
  Administered 2016-11-07 – 2016-11-08 (×4): 100 ug via INTRAVENOUS
  Filled 2016-11-07 (×4): qty 2

## 2016-11-07 MED ORDER — FAMOTIDINE IN NACL 20-0.9 MG/50ML-% IV SOLN
20.0000 mg | Freq: Two times a day (BID) | INTRAVENOUS | Status: DC
  Administered 2016-11-07 (×2): 20 mg via INTRAVENOUS
  Filled 2016-11-07 (×3): qty 50

## 2016-11-07 MED ORDER — BISACODYL 10 MG RE SUPP
10.0000 mg | Freq: Every day | RECTAL | Status: DC | PRN
Start: 2016-11-07 — End: 2016-11-09

## 2016-11-07 MED ORDER — PROPOFOL 500 MG/50ML IV EMUL
INTRAVENOUS | Status: DC | PRN
  Administered 2016-11-07: 50 ug/kg/min via INTRAVENOUS

## 2016-11-07 MED ORDER — MIDAZOLAM HCL 5 MG/5ML IJ SOLN
INTRAMUSCULAR | Status: DC | PRN
  Administered 2016-11-07: 2 mg via INTRAVENOUS

## 2016-11-07 MED ORDER — LIDOCAINE HCL (CARDIAC) 20 MG/ML IV SOLN
INTRAVENOUS | Status: DC | PRN
  Administered 2016-11-07: 60 mg via INTRAVENOUS

## 2016-11-07 MED ORDER — PHENYLEPHRINE HCL 10 MG/ML IJ SOLN
INTRAMUSCULAR | Status: DC | PRN
  Administered 2016-11-07 (×3): 80 ug via INTRAVENOUS
  Administered 2016-11-07: 40 ug via INTRAVENOUS
  Administered 2016-11-07: 80 ug via INTRAVENOUS

## 2016-11-07 MED ORDER — BACITRACIN ZINC 500 UNIT/GM EX OINT
TOPICAL_OINTMENT | CUTANEOUS | Status: DC | PRN
  Administered 2016-11-07: 1 via TOPICAL

## 2016-11-07 MED ORDER — ORAL CARE MOUTH RINSE
15.0000 mL | OROMUCOSAL | Status: DC
Start: 2016-11-07 — End: 2016-11-08
  Administered 2016-11-07 – 2016-11-08 (×6): 15 mL via OROMUCOSAL

## 2016-11-07 MED ORDER — SODIUM CHLORIDE 0.9 % IV BOLUS (SEPSIS)
500.0000 mL | Freq: Once | INTRAVENOUS | Status: AC
  Administered 2016-11-07: 500 mL via INTRAVENOUS

## 2016-11-07 MED ORDER — DEXTROSE 5 % IV SOLN
1.0000 g | INTRAVENOUS | Status: DC
  Administered 2016-11-07 – 2016-11-08 (×2): 1 g via INTRAVENOUS
  Filled 2016-11-07 (×3): qty 10

## 2016-11-07 MED ORDER — SODIUM CHLORIDE 0.9 % IV SOLN
0.0000 ug/min | INTRAVENOUS | Status: DC
  Administered 2016-11-08: 20 ug/min via INTRAVENOUS
  Filled 2016-11-07: qty 1

## 2016-11-07 MED ORDER — LACTATED RINGERS IV SOLN
INTRAVENOUS | Status: DC
  Administered 2016-11-07 (×3): via INTRAVENOUS

## 2016-11-07 MED ORDER — LIDOCAINE-EPINEPHRINE 1 %-1:100000 IJ SOLN
INTRAMUSCULAR | Status: AC
  Filled 2016-11-07: qty 1

## 2016-11-07 MED ORDER — MIDAZOLAM HCL 2 MG/2ML IJ SOLN
INTRAMUSCULAR | Status: AC
  Filled 2016-11-07: qty 2

## 2016-11-07 SURGICAL SUPPLY — 41 items
BLADE CLIPPER SURG (BLADE) IMPLANT
BLADE SURG 15 STRL LF DISP TIS (BLADE) IMPLANT
BLADE SURG 15 STRL SS (BLADE)
CANISTER SUCT 3000ML PPV (MISCELLANEOUS) ×3 IMPLANT
CLEANER TIP ELECTROSURG 2X2 (MISCELLANEOUS) ×3 IMPLANT
COVER SURGICAL LIGHT HANDLE (MISCELLANEOUS) ×3 IMPLANT
DECANTER SPIKE VIAL GLASS SM (MISCELLANEOUS) ×3 IMPLANT
DRAPE HALF SHEET 40X57 (DRAPES) IMPLANT
ELECT COATED BLADE 2.86 ST (ELECTRODE) ×3 IMPLANT
ELECT REM PT RETURN 9FT ADLT (ELECTROSURGICAL) ×3
ELECTRODE REM PT RTRN 9FT ADLT (ELECTROSURGICAL) ×1 IMPLANT
GAUZE SPONGE 4X4 12PLY STRL LF (GAUZE/BANDAGES/DRESSINGS) ×3 IMPLANT
GAUZE SPONGE 4X4 16PLY XRAY LF (GAUZE/BANDAGES/DRESSINGS) ×3 IMPLANT
GLOVE BIOGEL PI IND STRL 6.5 (GLOVE) ×1 IMPLANT
GLOVE BIOGEL PI INDICATOR 6.5 (GLOVE) ×2
GLOVE ECLIPSE 7.5 STRL STRAW (GLOVE) ×3 IMPLANT
GLOVE INDICATOR 7.0 STRL GRN (GLOVE) ×6 IMPLANT
GOWN STRL REUS W/ TWL LRG LVL3 (GOWN DISPOSABLE) ×2 IMPLANT
GOWN STRL REUS W/TWL LRG LVL3 (GOWN DISPOSABLE) ×4
KIT BASIN OR (CUSTOM PROCEDURE TRAY) ×3 IMPLANT
KIT ROOM TURNOVER OR (KITS) ×3 IMPLANT
NEEDLE PRECISIONGLIDE 27X1.5 (NEEDLE) ×3 IMPLANT
NS IRRIG 1000ML POUR BTL (IV SOLUTION) ×3 IMPLANT
PACK EENT II TURBAN DRAPE (CUSTOM PROCEDURE TRAY) ×3 IMPLANT
PAD ARMBOARD 7.5X6 YLW CONV (MISCELLANEOUS) ×6 IMPLANT
PENCIL FOOT CONTROL (ELECTRODE) ×3 IMPLANT
SUT CHROMIC 2 0 SH (SUTURE) IMPLANT
SUT CHROMIC 3 0 SH 27 (SUTURE) ×9 IMPLANT
SUT CHROMIC 4 0 PS 2 18 (SUTURE) IMPLANT
SUT ETHILON 3 0 PS 1 (SUTURE) IMPLANT
SUT ETHILON 4 0 PS 2 18 (SUTURE) ×3 IMPLANT
SUT SILK 4 0 TIES 17X18 (SUTURE) ×3 IMPLANT
SUT VIC AB 4-0 SH 18 (SUTURE) ×3 IMPLANT
SUT VIC AB 4-0 SH 27 (SUTURE) ×2
SUT VIC AB 4-0 SH 27XBRD (SUTURE) ×1 IMPLANT
SYR 20CC LL (SYRINGE) ×3 IMPLANT
SYR CONTROL 10ML LL (SYRINGE) ×3 IMPLANT
TOWEL OR 17X24 6PK STRL BLUE (TOWEL DISPOSABLE) ×3 IMPLANT
TUBE CONNECTING 12'X1/4 (SUCTIONS) ×1
TUBE CONNECTING 12X1/4 (SUCTIONS) ×2 IMPLANT
WATER STERILE IRR 1000ML POUR (IV SOLUTION) ×3 IMPLANT

## 2016-11-07 NOTE — Op Note (Signed)
OPERATIVE REPORT  DATE OF SURGERY: 11/07/2016  PATIENT:  [name],  51 y.o. male  PRE-OPERATIVE DIAGNOSIS:  Tracheocutaneous Fistula  POST-OPERATIVE DIAGNOSIS:  Tracheocutaneous Fistula  PROCEDURE:  Procedure(s): TRACHEAL ESOPHAGEAL FISTULA REPAIR  SURGEON:  Beckie Salts, MD  ASSISTANTS: None  ANESTHESIA:   General   EBL:  5 ml  DRAINS: None  LOCAL MEDICATIONS USED:  1% Xylocaine with epinephrine  SPECIMEN:  none  COUNTS:  Correct  PROCEDURE DETAILS: The patient was taken to the operating room and placed on the operating table in the supine position. Following induction of general tracheal anesthesia, the neck was prepped and draped in a standard fashion. Local anesthetic was infiltrated along the tracheostomy site.  A #15 scalpel was used to incise the mucocutaneous junction and to elevate skin flaps laterally and superiorly/inferiorly. The endotracheal tube was visible throughout this portion of the case. Was taken not to disrupt the endotracheal tube cuff. The tracheal closure was then accomplished with interrupted 3-0 chromic sutures. There is a nice tight seal provided. Additional skin was elevated laterally on both sides exposing the sternocleidomastoid muscle. The medial aspect of the sternocleidomastoid muscle was then reapproximated to form a secondary layer of closure using interrupted 4-0 Vicryl sutures. A nice tight seal was obtained here as well. The subcutaneous layer was then reapproximated with interrupted 3-0 chromic. 4-0 nylon was used in a running fashion to reapproximate the skin.  Bacitracin and a dressing was applied. The patient was kept intubated and will be observed overnight in the intensive care unit under the care of the pulmonary critical care doctors. The plan is to attempt extubation tomorrow after allowing 24 hours of ventilatory support.    PATIENT DISPOSITION:  To ICU, stable

## 2016-11-07 NOTE — Interval H&P Note (Signed)
History and Physical Interval Note:  11/07/2016 9:38 AM  [name]  has presented today for surgery, with the diagnosis of Tracheocutaneous Fistula  The various methods of treatment have been discussed with the patient and family. After consideration of risks, benefits and other options for treatment, the patient has consented to  Procedure(s) with comments: TRACHEAL ESOPHAGEAL FISTULA REPAIR (N/A) - Repair of Tracheocutaneous Fistula as a surgical intervention .  The patient's history has been reviewed, patient examined, no change in status, stable for surgery.  I have reviewed the patient's chart and labs.  Questions were answered to the patient's satisfaction.     Valeria Boza

## 2016-11-07 NOTE — Progress Notes (Signed)
Patient arrived on unit, with chronic catheter.  Catheter was clogged with grayish sediment,  nd was not pouring into bag.  Spoke with Salvadore Dom, NP.  Order was placed to replace catheter.  Once catheter was removed, patient urinated a large amount and what look like, passed a large hard stone-shaped object.  NP notified, new foley placed.
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
## 2016-11-07 NOTE — Transfer of Care (Signed)
Immediate Anesthesia Transfer of Care Note  Patient: [name]  Procedure(s) Performed: Procedure(s) with comments: TRACHEAL ESOPHAGEAL FISTULA REPAIR (N/A) - Repair of Tracheocutaneous Fistula  Patient Location: ICU  Anesthesia Type:General  Level of Consciousness: Patient remains intubated per anesthesia plan  Airway & Oxygen Therapy: Patient remains intubated per anesthesia plan and Patient placed on Ventilator (see vital sign flow sheet for setting)  Post-op Assessment: Report given to RN and Post -op Vital signs reviewed and stable  Post vital signs: Reviewed and stable  Last Vitals:  Vitals:   11/07/16 0823  BP: 115/78  Pulse: 84  Resp: 18  Temp: 36.4 C    Last Pain:  Vitals:   11/07/16 0855  TempSrc:   PainSc: 7       Patients Stated Pain Goal: 7 (36/46/80 3212)  Complications: No apparent anesthesia complications

## 2016-11-07 NOTE — Progress Notes (Signed)
ETT holder placed on patient, with assistance of another RT, without complications.
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
## 2016-11-07 NOTE — Anesthesia Preprocedure Evaluation (Signed)
Anesthesia Evaluation  Patient identified by MRN, date of birth, ID band Patient awake    Reviewed: Allergy & Precautions, NPO status , Patient's Chart, lab work & pertinent test results  Airway Mallampati: II  TM Distance: >3 FB     Dental  (+) Dental Advisory Given   Pulmonary  Previous trach   Pulmonary exam normal breath sounds clear to auscultation       Cardiovascular negative cardio ROS Normal cardiovascular exam Rhythm:Regular Rate:Normal     Neuro/Psych Anxiety Quadriplegic    GI/Hepatic Neg liver ROS, GERD  ,  Endo/Other  negative endocrine ROS  Renal/GU negative Renal ROS     Musculoskeletal  (+) Arthritis ,   Abdominal   Peds  Hematology  (+) anemia ,   Anesthesia Other Findings   Reproductive/Obstetrics                             Anesthesia Physical  Anesthesia Plan  ASA: III  Anesthesia Plan: General   Post-op Pain Management:    Induction: Intravenous  PONV Risk Score and Plan: 2 and Ondansetron and Dexamethasone  Airway Management Planned: Oral ETT  Additional Equipment:   Intra-op Plan:   Post-operative Plan: Post-operative intubation/ventilation  Informed Consent: I have reviewed the patients History and Physical, chart, labs and discussed the procedure including the risks, benefits and alternatives for the proposed anesthesia with the patient or authorized representative who has indicated his/her understanding and acceptance.   Dental advisory given  Plan Discussed with: CRNA and Surgeon  Anesthesia Plan Comments:         Anesthesia Quick Evaluation

## 2016-11-07 NOTE — Anesthesia Procedure Notes (Signed)
Procedure Name: Intubation Date/Time: 11/07/2016 10:07 AM Performed by: Ollen Bowl Pre-anesthesia Checklist: Patient identified, Emergency Drugs available, Suction available, Patient being monitored and Timeout performed Patient Re-evaluated:Patient Re-evaluated prior to induction Oxygen Delivery Method: Circle system utilized Preoxygenation: Pre-oxygenation with 100% oxygen Induction Type: IV induction Ventilation: Mask ventilation without difficulty Laryngoscope Size: Mac and 3 Grade View: Grade III Tube type: Oral Tube size: 7.5 mm Number of attempts: 1 Airway Equipment and Method: Patient positioned with wedge pillow and Stylet Placement Confirmation: ETT inserted through vocal cords under direct vision,  positive ETCO2 and breath sounds checked- equal and bilateral Secured at: 23 cm Tube secured with: Tape Dental Injury: Teeth and Oropharynx as per pre-operative assessment

## 2016-11-07 NOTE — Anesthesia Postprocedure Evaluation (Signed)
Anesthesia Post Note  Patient: [name]  Procedure(s) Performed: Procedure(s) (LRB): TRACHEAL ESOPHAGEAL FISTULA REPAIR (N/A)     Patient location during evaluation: SICU Anesthesia Type: General Level of consciousness: sedated Pain management: pain level controlled Vital Signs Assessment: post-procedure vital signs reviewed and stable Respiratory status: patient remains intubated per anesthesia plan Cardiovascular status: stable Anesthetic complications: no    Last Vitals:  Vitals:   11/07/16 0823 11/07/16 1134  BP: 115/78   Pulse: 84   Resp: 18   Temp: 36.4 C (!) 36.3 C    Last Pain:  Vitals:   11/07/16 1134  TempSrc: Oral  PainSc:                  Tiajuana Amass

## 2016-11-08 DIAGNOSIS — J9601 Acute respiratory failure with hypoxia: Secondary | ICD-10-CM

## 2016-11-08 LAB — URINE CULTURE

## 2016-11-08 LAB — BASIC METABOLIC PANEL
Anion gap: 5 (ref 5–15)
BUN: 7 mg/dL (ref 6–20)
CHLORIDE: 109 mmol/L (ref 101–111)
CO2: 24 mmol/L (ref 22–32)
Calcium: 9.4 mg/dL (ref 8.9–10.3)
Creatinine, Ser: 0.33 mg/dL — ABNORMAL LOW (ref 0.61–1.24)
GFR calc non Af Amer: >60 mL/min (ref >60)
Glucose, Bld: 103 mg/dL — ABNORMAL HIGH (ref 65–99)
POTASSIUM: 3.5 mmol/L (ref 3.5–5.1)
SODIUM: 138 mmol/L (ref 135–145)

## 2016-11-08 MED ORDER — OXYCODONE HCL ER 10 MG PO T12A
10.0000 mg | EXTENDED_RELEASE_TABLET | Freq: Two times a day (BID) | ORAL | Status: DC
  Administered 2016-11-08 – 2016-11-09 (×3): 10 mg via ORAL
  Filled 2016-11-08 (×3): qty 1

## 2016-11-08 MED ORDER — ORAL CARE MOUTH RINSE
15.0000 mL | Freq: Two times a day (BID) | OROMUCOSAL | Status: DC
  Administered 2016-11-08: 15 mL via OROMUCOSAL

## 2016-11-08 MED ORDER — ATROPINE SULFATE 1 MG/10ML IJ SOSY
PREFILLED_SYRINGE | INTRAMUSCULAR | Status: AC
  Filled 2016-11-08: qty 10

## 2016-11-08 MED ORDER — SERTRALINE HCL 50 MG PO TABS
50.0000 mg | ORAL_TABLET | Freq: Every day | ORAL | Status: DC
  Administered 2016-11-08 – 2016-11-09 (×2): 50 mg via ORAL
  Filled 2016-11-08 (×2): qty 1

## 2016-11-08 NOTE — Progress Notes (Signed)
Pt transfer from 61M s/p trach stoma suture with gauze dressing, 02nc at 2l , quadriplegic but can moved his upper extremities, with foley upon admission Hx Kidney stones, pt MRSA PCR negative but staph aureus positive, charge nurse Sharee Pimple said not isolation, alert and oriented, lung sound rhonchi encourage to cough up his phlegm using suction, will continue to monitor, afebrile.

## 2016-11-08 NOTE — Procedures (Signed)
Extubation Procedure Note  Patient Details:   Name: [name] DOB: 1965/12/10 MRN: [id number]   Airway Documentation:     Evaluation  O2 sats: stable throughout Complications: No apparent complications Patient did tolerate procedure well. Bilateral Breath Sounds: Clear, Diminished   Yes   Patient extubated to [place]. Vital signs stable at this time. No complications. Patient is tolerating well at this time. RN at bedside. RT will continue to monitor.  Mcneil Sober 11/08/2016, 9:15 AM

## 2016-11-08 NOTE — Progress Notes (Addendum)
PULMONARY / CRITICAL CARE MEDICINE   Name: [name] MRN: [id number] DOB: 02/28/1966    ADMISSION DATE:  11/07/2016 CONSULTATION DATE: Caroleen Hamman  REFERRING MD:  Constance Holster   CHIEF COMPLAINT:  Ventilator management   HISTORY OF PRESENT ILLNESS:   This is a 51 year old male w/ h/o trach f/b P Babcock at trach clinic. decannulated back in [place]. Stoma never closed. Referred to ENT. 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 ENT wanted to keep him on vent overnight. PCCM asked to assist w/ care    SUBJECTIVE:  Passed SBT so extubated   VITAL SIGNS: BP 115/84   Pulse 85   Temp 98 F (36.7 C) (Oral)   Resp 14   Ht 6\' 1"  (1.854 m) Comment: per family and chart review  Wt 125 lb 10.6 oz (57 kg)   SpO2 95%   BMI 16.58 kg/m   HEMODYNAMICS:    VENTILATOR SETTINGS: Vent Mode: CPAP;PSV FiO2 (%):  [40 %-60 %] 40 % Set Rate:  [10 bmp-14 bmp] 10 bmp Vt Set:  [440 mL-500 mL] 440 mL PEEP:  [5 cmH20] 5 cmH20 Pressure Support:  [5 cmH20] 5 cmH20 Plateau Pressure:  [11 cmH20-13 cmH20] 12 cmH20  INTAKE / OUTPUT: I/O last 3 completed shifts: In: 2869.2 [I.V.:2319.2; IV Piggyback:550] Out: 5397 [Urine:1350; Blood:5]  PHYSICAL EXAMINATION: General appearance:  51 Year old  Male, well nourishedNAD, currently in acute distress,conversant  Eyes: anicteric sclerae, moist conjunctivae; PERRL, EOMI bilaterally. Mouth:  membranes and no mucosal ulcerations; normal hard and soft palate Neck: Trachea midline; neck supple, no JVD, mid neck dressing CD&I Lungs/chest: clear, passed SBT, with normal respiratory effort and no intercostal retractions CV: RRR, no MRGs  Abdomen: Soft, non-tender; no masses or HSM Extremities: No peripheral edema or extremity lymphadenopathy Skin: Normal temperature, turgor and texture; no rash, ulcers or subcutaneous nodules Psych: Appropriate affect, alert and oriented to person, place and time, gross motor only of uppers  Derm: Sacral  decub LABS:  BMET  Recent Labs Lab 11/07/16 0836 11/08/16 0217  NA 137 138  K 5.2* 3.5  CL 107 109  CO2 22 24  BUN 12 7  CREATININE 0.44* 0.33*  GLUCOSE 87 103*    Electrolytes  Recent Labs Lab 11/07/16 0836 11/08/16 0217  CALCIUM 9.4 9.4    CBC  Recent Labs Lab 11/07/16 0836  WBC 5.1  HGB 11.7*  HCT 35.3*  PLT 351    Coag's No results for input(s): APTT, INR in the last 168 hours.  Sepsis Markers No results for input(s): LATICACIDVEN, PROCALCITON, O2SATVEN in the last 168 hours.  ABG  Recent Labs Lab 11/07/16 1221  PHART 7.521*  PCO2ART 32.3  PO2ART 299.0*    Liver Enzymes  Recent Labs Lab 11/07/16 0836  AST 31  ALT 12*  ALKPHOS 88  BILITOT 0.8  ALBUMIN 3.2*    Cardiac Enzymes No results for input(s): TROPONINI, PROBNP in the last 168 hours.  Glucose  Recent Labs Lab 11/07/16 1136  GLUCAP 90    Imaging US Renal Port  Result Date: 11/07/2016 CLINICAL DATA:  Bilateral renal stones. EXAM: RENAL / URINARY TRACT ULTRASOUND COMPLETE COMPARISON:  None. FINDINGS: Right Kidney: Length: 12.0 cm. Echogenicity within normal limits. There is a 9 mm echogenic focus in the upper pole cortex. There is a 9 mm cyst in the lower pole. No hydronephrosis visualized. Left Kidney: Length: 10.6 cm.  Poorly visualized. Bladder: Foley catheter within a decompressed bladder. IMPRESSION: 1. 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 Recommend renal protocol CT for further evaluation. 2. Poor visualization of the left kidney. Electronically Signed   By: Titus Dubin M.D.   On: 11/07/2016 16:50     STUDIES:    CULTURES: UC 8/3>>>  ANTIBIOTICS: Rocephin 8/3>>>  SIGNIFICANT EVENTS:   LINES/TUBES: oett 8/3>>>  DISCUSSION: [place] well known to PB. Followed at trach clinic. Decannulated back in April 2018. Stoma never closed. Sent to ENT for referral. Underwent repair of tracheocutaneous  fistula -extubated -adv diet   ASSESSMENT / PLAN: Ventilator dependence for airway protection S/p repair of tracheotaneous fistula  Passed sbt Extubated  Plan Wean o2 pulm hygiene  Adv diet   H/o GERD Plan:   ppi Adv diet   Anemia of chronic disease  Plan:  [place] heparin Serial cbc Transfuse as indicated  Chronic foley Possible uti Renal US: 1. 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 Recommend renal protocol CT for further evaluation. 2. Poor visualization of the left kidney.  Plan:   F/u culture Day 2 rocephin  Foley changed 8/3 Could consider CT at 6 months   H/o quadriplegia  Plan:   Dc sedation PRN analgesia   Chronic sacral wound Plan WOC consult   FAMILY  - Updates: pending   - Inter-disciplinary family meet or Palliative Care meeting due by:  8/10  My cct 30 min  P Babcock, NP 11/08/2016, 10:16 AM   Attending Note:  I have examined patient, reviewed labs, studies and notes. I have discussed the case with Jerrye Bushy, and I agree with the data and plans as amended above. 51 yo man, decannulated in April but had a tracheocutaneous fistula. This was surgically closed 8/3. On eval this am he was comfortable on PSV, goo Vt. Lungs clear bilaterally. Heart regular without a murmur. He has decreased movement and strength B UE's, not LE. We will extubate today, start diet. Continue ceftriaxone for possible UTI. 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 Should be able to tolerate a diet, will advance. Likely out of ICU this pm.  Independent critical care time is 35 minutes.   Baltazar Apo, MD, PhD 11/08/2016, 11:37 AM [facility] [phone] or if no answer [phone]

## 2016-11-09 MED ORDER — ALBUTEROL SULFATE (2.5 MG/3ML) 0.083% IN NEBU
2.5000 mg | INHALATION_SOLUTION | RESPIRATORY_TRACT | Status: DC | PRN
  Administered 2016-11-09: 2.5 mg via RESPIRATORY_TRACT
  Filled 2016-11-09: qty 3

## 2016-11-09 NOTE — Care Management Note (Signed)
Case Management Note  Patient Details  Name: [name] MRN: [id number] Date of Birth: February 13, 1966  Subjective/Objective:   Assisted with Med Nec form                 Action/Plan: Will call [facility] at [phone] when ready   Expected Discharge Date:                  Expected Discharge Plan:  Home/Self Care  In-House Referral:  NA  Discharge planning Services  CM Consult, Other - See comment (Med Nec Form to assist RN)  Post Acute Care Choice:    Choice offered to:  NA  DME Arranged:  N/A DME Agency:  NA  HH Arranged:  NA HH Agency:  NA  Status of Service:  Completed, signed off  If discussed at [place] of Stay Meetings, dates discussed:    Additional Comments:  Delrae Sawyers, RN 11/09/2016, 11:57 AM

## 2016-11-09 NOTE — Progress Notes (Signed)
2 Days Post-Op   Subjective/Chief Complaint: He is feeling baseline now. Earlier he had increased congestion. Sat good.    Objective: Vital signs in last 24 hours: Temp:  [97.6 F (36.4 C)-98.6 F (37 C)] 98.1 F (36.7 C) (08/05 0407) Pulse Rate:  [90-123] 101 (08/05 0407) Resp:  [14-19] 19 (08/05 0407) BP: (79-124)/(56-90) 96/61 (08/05 0407) SpO2:  [91 %-100 %] 94 % (08/05 0730) Weight:  [59.7 kg (131 lb 11.2 oz)] 59.7 kg (131 lb 11.2 oz) (08/04 1524) Last BM Date: 11/07/16  Intake/Output from previous day: 08/04 0701 - 08/05 0700 In: 545 [P.O.:150; I.V.:295; IV Piggyback:100] Out: 540 [Urine:540] Intake/Output this shift: Total I/O In: 50 [P.O.:50] Out: -   awake and alert. no distress. normal voice. no stridor. he had some mucus in the cough. lung- no wheezing. cv- rrr. ext- no swelling or tenderness.  Lab Results:   Recent Labs  11/07/16 0836  WBC 5.1  HGB 11.7*  HCT 35.3*  PLT 351   BMET  Recent Labs  11/07/16 0836 11/08/16 0217  NA 137 138  K 5.2* 3.5  CL 107 109  CO2 22 24  GLUCOSE 87 103*  BUN 12 7  CREATININE 0.44* 0.33*  CALCIUM 9.4 9.4   PT/INR No results for input(s): LABPROT, INR in the last 72 hours. ABG  Recent Labs  11/07/16 1221  PHART 7.521*  HCO3 26.6    Studies/Results: US Renal Port  Result Date: 11/07/2016 CLINICAL DATA:  Bilateral renal stones. EXAM: RENAL / URINARY TRACT ULTRASOUND COMPLETE COMPARISON:  None. FINDINGS: Right Kidney: Length: 12.0 cm. Echogenicity within normal limits. There is a 9 mm echogenic focus in the upper pole cortex. There is a 9 mm cyst in the lower pole. No hydronephrosis visualized. Left Kidney: Length: 10.6 cm.  Poorly visualized. Bladder: Foley catheter within a decompressed bladder. IMPRESSION: 1. 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 Recommend renal protocol CT for further evaluation. 2. Poor visualization of the left kidney. Electronically  Signed   By: Titus Dubin M.D.   On: 11/07/2016 16:50    Anti-infectives: Anti-infectives    Start     Dose/Rate Route Frequency Ordered Stop   11/08/16 0000  clindamycin (CLEOCIN) IVPB 900 mg     900 mg 100 mL/hr over 30 Minutes Intravenous On call to O.R. 11/07/16 0829 11/07/16 1011   11/07/16 1400  cefTRIAXone (ROCEPHIN) 1 g in dextrose 5 % 50 mL IVPB     1 g 100 mL/hr over 30 Minutes Intravenous Every 24 hours 11/07/16 1303        Assessment/Plan: s/p Procedure(s) with comments: TRACHEAL ESOPHAGEAL FISTULA REPAIR (N/A) - Repair of Tracheocutaneous Fistula he is back to baseline according to patient. he has a daughter that will care for him today. he wants to go home. He has no breathing issues. he needs to have O2 removed to check sats. talk to daughter about discharge.   LOS: 2 days    Melissa Montane 11/09/2016

## 2016-11-09 NOTE — Progress Notes (Addendum)
0745 Pt was congested this morning, + bil rhonchi and crackles. Pt is scared to cough hard due to surgery at the old trach stoma. But still able cough out whitish phlegm. 1100 Pt is feeling better, O2 sats at room air 98%. Breath sounds better but still with some rhonchi. Pt's daughter [name] is at the bedside, this is pt's baseline. She is the pt's main caregiver.  Agree for discharge today. Dr Janace Hoard notified. 1300 Discharge instructions given to pt's daughter. 1430 Pt was discharge to home via [facility].

## 2016-11-09 NOTE — Care Management Note (Signed)
Case Management Note  Patient Details  Name: [name] MRN: [id number] Date of Birth: 1965-08-19  Subjective/Objective:  Called [facility] for nojn emergency Transport Home. No ETA. Made RN aware                  Action/Plan:CM will sign off for now but will be available should additional discharge needs arise or disposition change.    Expected Discharge Date:  11/09/16               Expected Discharge Plan:  Home/Self Care  In-House Referral:  NA  Discharge planning Services  CM Consult, Other - See comment (Med Nec Form to assist RN)  Post Acute Care Choice:    Choice offered to:  NA  DME Arranged:  N/A DME Agency:  NA  HH Arranged:  NA HH Agency:  NA  Status of Service:  Completed, signed off  If discussed at [place] of Stay Meetings, dates discussed:    Additional Comments:  Delrae Sawyers, RN 11/09/2016, 1:55 PM

## 2016-11-11 ENCOUNTER — Encounter (HOSPITAL_COMMUNITY): Payer: Self-pay | Admitting: Otolaryngology

## 2016-11-11 LAB — GLUCOSE, CAPILLARY: GLUCOSE-CAPILLARY: 103 mg/dL — AB (ref 65–99)

## 2016-11-28 NOTE — Discharge Summary (Signed)
Physician Discharge Summary  Patient ID: [name] MRN: [id number] DOB/AGE: 1965/08/08 51 y.o.  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 rate 19, height 6\' 1"  (1.854 m), weight 59.7 kg (131 lb 11.2 oz), SpO2 98 %. PHYSICAL EXAM: Surgical site looks excellent.  Disposition: 01-Home or Self Care  Discharge Instructions    Call MD for:  difficulty breathing, headache or visual disturbances    Complete by:  As directed    Call MD for:  extreme fatigue    Complete by:  As directed    Call MD for:  hives    Complete by:  As directed    Call MD for:  persistant dizziness or light-headedness    Complete by:  As directed    Call MD for:  persistant nausea and vomiting    Complete by:  As directed    Call MD for:  redness, tenderness, or signs of infection (pain, swelling, redness, odor or green/yellow discharge around incision site)    Complete by:  As directed    Call MD for:  severe uncontrolled pain    Complete by:  As directed    Call MD for:  temperature >100.4    Complete by:  As directed    Diet - low sodium heart healthy    Complete by:  As directed    Discharge instructions    Complete by:  As directed    Call with any problem or complaint. Call the office of Dr Constance Holster in morning to get follow up appointment. Home nurse is scheduled to come to your house tomorrow,   Increase activity slowly    Complete by:  As directed      Allergies as of 11/09/2016      Reactions   Niacin And Related Hives   Other Hives   From work uniform   Shellfish Allergy Swelling   Lip swelling      Medication List    STOP taking these medications   cholecalciferol 1000 units tablet Commonly known as:  VITAMIN D     TAKE these medications   ALKA-SELTZER PLUS COLD PO Take 2 tablets by mouth daily as needed (cold symptoms).   ANASEPT EX Apply 1 application topically daily.   baclofen 10 MG tablet Commonly known as:  LIORESAL Take 10 mg by mouth 2 (two) times daily.   busPIRone 15 MG tablet Commonly known as:  BUSPAR Take 7.5 mg by mouth at bedtime.   cetirizine 10 MG tablet Commonly known as:  ZYRTEC Take 10 mg by mouth daily as needed for allergies.   diclofenac sodium 1 % Gel Commonly known as:  VOLTAREN Apply 2 g topically at bedtime.   docusate sodium 100 MG capsule Commonly known as:  COLACE Take 1 capsule (100 mg total) by mouth 2 (two) times daily.   Oxycodone HCl 10 MG Tabs Take 10 mg by mouth 2 (two) times daily as needed (for pain).   pantoprazole 20 MG tablet Commonly known as:  PROTONIX Take 20 mg by mouth daily.   sertraline 50 MG tablet Commonly known as:  ZOLOFT Take 50 mg by  mouth at bedtime.   traZODone 100 MG tablet Commonly known as:  DESYREL Take 100 mg by mouth at bedtime.            Discharge Care Instructions        Start     Ordered   11/09/16 0000  Increase activity slowly     11/09/16 1256   11/09/16 0000  Diet - low sodium heart healthy     11/09/16 1256   11/09/16 0000  Call MD for:  temperature >100.4     11/09/16 1256   11/09/16 0000  Call MD for:  persistant nausea and vomiting     11/09/16 1256   11/09/16 0000  Call MD for:  severe uncontrolled pain     11/09/16 1256   11/09/16 0000  Call MD for:  redness, tenderness, or signs of infection (pain, swelling, redness, odor or green/yellow discharge around incision site)     11/09/16 1256   11/09/16 0000  Call MD for:  difficulty breathing, headache or visual disturbances      11/09/16 1256   11/09/16 0000  Call MD for:  hives     11/09/16 1256   11/09/16 0000  Call MD for:  persistant dizziness or light-headedness     11/09/16 1256   11/09/16 0000  Call MD for:  extreme fatigue     11/09/16 1256   11/09/16 0000  Discharge instructions    Comments:  Call with any problem or complaint. Call the office of Dr Constance Holster in morning to get follow up appointment. Home nurse is scheduled to come to your house tomorrow,   11/09/16 1256       Signed: Izora Gala 11/28/2016, 8:50 AM

## 2017-05-27 DIAGNOSIS — D649 Anemia, unspecified: Secondary | ICD-10-CM | POA: Insufficient documentation

## 2017-05-27 DIAGNOSIS — G8929 Other chronic pain: Secondary | ICD-10-CM | POA: Insufficient documentation

## 2017-05-27 DIAGNOSIS — G4700 Insomnia, unspecified: Secondary | ICD-10-CM | POA: Insufficient documentation

## 2017-05-27 DIAGNOSIS — E43 Unspecified severe protein-calorie malnutrition: Secondary | ICD-10-CM | POA: Insufficient documentation

## 2017-06-08 ENCOUNTER — Emergency Department (HOSPITAL_COMMUNITY)
Admission: EM | Admit: 2017-06-08 | Discharge: 2017-06-08 | Disposition: A | Discharge status: Home / Self Care | Payer: Medicaid Other | Attending: Emergency Medicine | Admitting: Emergency Medicine

## 2017-06-08 ENCOUNTER — Encounter (HOSPITAL_COMMUNITY): Payer: Self-pay

## 2017-06-08 DIAGNOSIS — Z978 Presence of other specified devices: Secondary | ICD-10-CM

## 2017-06-08 DIAGNOSIS — Z960 Presence of urogenital implants: Secondary | ICD-10-CM | POA: Diagnosis not present

## 2017-06-08 DIAGNOSIS — G8250 Quadriplegia, unspecified: Secondary | ICD-10-CM | POA: Insufficient documentation

## 2017-06-08 DIAGNOSIS — R310 Gross hematuria: Secondary | ICD-10-CM | POA: Diagnosis not present

## 2017-06-08 DIAGNOSIS — R319 Hematuria, unspecified: Secondary | ICD-10-CM | POA: Diagnosis present

## 2017-06-08 DIAGNOSIS — Z79899 Other long term (current) drug therapy: Secondary | ICD-10-CM | POA: Diagnosis not present

## 2017-06-08 LAB — CBC WITH DIFFERENTIAL/PLATELET
BASOS PCT: 0 %
Basophils Absolute: 0 10*3/uL (ref 0.0–0.1)
EOS ABS: 0.6 10*3/uL (ref 0.0–0.7)
Eosinophils Relative: 6 %
HEMATOCRIT: 40.9 % (ref 39.0–52.0)
HEMOGLOBIN: 13.6 g/dL (ref 13.0–17.0)
Lymphocytes Relative: 9 %
Lymphs Abs: 0.9 10*3/uL (ref 0.7–4.0)
MCH: 26.5 pg (ref 26.0–34.0)
MCHC: 33.3 g/dL (ref 30.0–36.0)
MCV: 79.6 fL (ref 78.0–100.0)
MONOS PCT: 1 %
Monocytes Absolute: 0.1 10*3/uL (ref 0.1–1.0)
NEUTROS ABS: 8.6 10*3/uL — AB (ref 1.7–7.7)
NEUTROS PCT: 84 %
Platelets: 363 10*3/uL (ref 150–400)
RBC: 5.14 MIL/uL (ref 4.22–5.81)
RDW: 14.1 % (ref 11.5–15.5)
WBC: 10.2 10*3/uL (ref 4.0–10.5)

## 2017-06-08 LAB — BASIC METABOLIC PANEL
Anion gap: 10 (ref 5–15)
BUN: 16 mg/dL (ref 6–20)
CALCIUM: 10 mg/dL (ref 8.9–10.3)
CO2: 25 mmol/L (ref 22–32)
CREATININE: 0.6 mg/dL — AB (ref 0.61–1.24)
Chloride: 104 mmol/L (ref 101–111)
GFR calc non Af Amer: >60 mL/min (ref >60)
GLUCOSE: 94 mg/dL (ref 65–99)
Potassium: 3.6 mmol/L (ref 3.5–5.1)
Sodium: 139 mmol/L (ref 135–145)

## 2017-06-08 LAB — URINALYSIS, MICROSCOPIC (REFLEX)

## 2017-06-08 LAB — URINALYSIS, ROUTINE W REFLEX MICROSCOPIC
BILIRUBIN URINE: NEGATIVE
GLUCOSE, UA: NEGATIVE mg/dL
KETONES UR: NEGATIVE mg/dL
Nitrite: NEGATIVE
PH: 6.5 (ref 5.0–8.0)
Protein, ur: 30 mg/dL — AB
Specific Gravity, Urine: >1.03 — ABNORMAL HIGH (ref 1.005–1.030)

## 2017-06-08 NOTE — Discharge Instructions (Signed)
Take your usual prescriptions as previously directed. Keep your foley catheter in place.  Call  the Urologist today to schedule a follow up appointment within the week.  Return to the Emergency Department immediately sooner if worsening.

## 2017-06-08 NOTE — ED Notes (Signed)
EMS notified for transport back home. 
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
## 2017-06-08 NOTE — ED Notes (Signed)
Attempted to insert coude cath. Unable to pass cathter. Red blood actively coming from penis

## 2017-06-08 NOTE — ED Triage Notes (Signed)
EMS called to pts home due to blood in cath. [place] nurse had changed cath and tubing had sediment and blood in it. Then after nurse left red blood in tubing. Nurse called back to home and removed cath

## 2017-06-08 NOTE — ED Notes (Signed)
[facility] in to transport

## 2017-06-10 LAB — URINE CULTURE

## 2017-07-15 ENCOUNTER — Encounter (HOSPITAL_COMMUNITY): Payer: Self-pay

## 2017-07-15 ENCOUNTER — Ambulatory Visit (HOSPITAL_COMMUNITY): Payer: Medicaid Other | Attending: Family Medicine

## 2017-07-15 ENCOUNTER — Other Ambulatory Visit: Payer: Self-pay

## 2017-07-15 DIAGNOSIS — M6281 Muscle weakness (generalized): Secondary | ICD-10-CM | POA: Diagnosis not present
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
## 2017-07-31 ENCOUNTER — Ambulatory Visit (INDEPENDENT_AMBULATORY_CARE_PROVIDER_SITE_OTHER): Payer: Medicaid Other | Admitting: Urology

## 2017-07-31 DIAGNOSIS — N318 Other neuromuscular dysfunction of bladder: Secondary | ICD-10-CM

## 2017-09-14 DIAGNOSIS — M24542 Contracture, left hand: Secondary | ICD-10-CM | POA: Insufficient documentation

## 2017-09-14 DIAGNOSIS — M24541 Contracture, right hand: Secondary | ICD-10-CM | POA: Insufficient documentation

## 2017-10-20 ENCOUNTER — Other Ambulatory Visit: Payer: Self-pay

## 2017-10-20 ENCOUNTER — Inpatient Hospital Stay (HOSPITAL_COMMUNITY)
Admission: EM | Admit: 2017-10-20 | Discharge: 2017-10-23 | DRG: 539 | Disposition: A | Discharge status: Home Health | Payer: Medicare Other | Attending: Internal Medicine | Admitting: Internal Medicine

## 2017-10-20 ENCOUNTER — Emergency Department (HOSPITAL_COMMUNITY): Payer: Medicare Other

## 2017-10-20 ENCOUNTER — Encounter (HOSPITAL_COMMUNITY): Payer: Self-pay | Admitting: Emergency Medicine

## 2017-10-20 DIAGNOSIS — Z981 Arthrodesis status: Secondary | ICD-10-CM | POA: Diagnosis not present

## 2017-10-20 DIAGNOSIS — Z960 Presence of urogenital implants: Secondary | ICD-10-CM | POA: Diagnosis present

## 2017-10-20 DIAGNOSIS — Z931 Gastrostomy status: Secondary | ICD-10-CM | POA: Diagnosis not present

## 2017-10-20 DIAGNOSIS — Z9049 Acquired absence of other specified parts of digestive tract: Secondary | ICD-10-CM

## 2017-10-20 DIAGNOSIS — Z95828 Presence of other vascular implants and grafts: Secondary | ICD-10-CM

## 2017-10-20 DIAGNOSIS — R636 Underweight: Secondary | ICD-10-CM | POA: Diagnosis present

## 2017-10-20 DIAGNOSIS — N390 Urinary tract infection, site not specified: Secondary | ICD-10-CM | POA: Diagnosis present

## 2017-10-20 DIAGNOSIS — Z87442 Personal history of urinary calculi: Secondary | ICD-10-CM

## 2017-10-20 DIAGNOSIS — M7989 Other specified soft tissue disorders: Secondary | ICD-10-CM | POA: Diagnosis present

## 2017-10-20 DIAGNOSIS — Z91013 Allergy to seafood: Secondary | ICD-10-CM | POA: Diagnosis not present

## 2017-10-20 DIAGNOSIS — Z888 Allergy status to other drugs, medicaments and biological substances status: Secondary | ICD-10-CM | POA: Diagnosis not present

## 2017-10-20 DIAGNOSIS — M21372 Foot drop, left foot: Secondary | ICD-10-CM | POA: Diagnosis present

## 2017-10-20 DIAGNOSIS — K219 Gastro-esophageal reflux disease without esophagitis: Secondary | ICD-10-CM | POA: Diagnosis present

## 2017-10-20 DIAGNOSIS — L03116 Cellulitis of left lower limb: Secondary | ICD-10-CM | POA: Diagnosis present

## 2017-10-20 DIAGNOSIS — D649 Anemia, unspecified: Secondary | ICD-10-CM | POA: Diagnosis present

## 2017-10-20 DIAGNOSIS — M21371 Foot drop, right foot: Secondary | ICD-10-CM | POA: Diagnosis present

## 2017-10-20 DIAGNOSIS — Z79891 Long term (current) use of opiate analgesic: Secondary | ICD-10-CM | POA: Diagnosis not present

## 2017-10-20 DIAGNOSIS — S14109S Unspecified injury at unspecified level of cervical spinal cord, sequela: Secondary | ICD-10-CM

## 2017-10-20 DIAGNOSIS — L0390 Cellulitis, unspecified: Secondary | ICD-10-CM | POA: Diagnosis present

## 2017-10-20 DIAGNOSIS — B9562 Methicillin resistant Staphylococcus aureus infection as the cause of diseases classified elsewhere: Secondary | ICD-10-CM | POA: Diagnosis present

## 2017-10-20 DIAGNOSIS — M86172 Other acute osteomyelitis, left ankle and foot: Secondary | ICD-10-CM | POA: Diagnosis present

## 2017-10-20 DIAGNOSIS — G8250 Quadriplegia, unspecified: Secondary | ICD-10-CM | POA: Diagnosis present

## 2017-10-20 DIAGNOSIS — T83511A Infection and inflammatory reaction due to indwelling urethral catheter, initial encounter: Secondary | ICD-10-CM | POA: Diagnosis present

## 2017-10-20 DIAGNOSIS — Z681 Body mass index (BMI) 19 or less, adult: Secondary | ICD-10-CM

## 2017-10-20 DIAGNOSIS — Y846 Urinary catheterization as the cause of abnormal reaction of the patient, or of later complication, without mention of misadventure at the time of the procedure: Secondary | ICD-10-CM | POA: Diagnosis present

## 2017-10-20 DIAGNOSIS — G8254 Quadriplegia, C5-C7 incomplete: Secondary | ICD-10-CM

## 2017-10-20 DIAGNOSIS — M869 Osteomyelitis, unspecified: Secondary | ICD-10-CM

## 2017-10-20 DIAGNOSIS — Z79899 Other long term (current) drug therapy: Secondary | ICD-10-CM

## 2017-10-20 DIAGNOSIS — L02612 Cutaneous abscess of left foot: Secondary | ICD-10-CM | POA: Diagnosis present

## 2017-10-20 LAB — CBC WITH DIFFERENTIAL/PLATELET
BASOS ABS: 0.1 10*3/uL (ref 0.0–0.1)
BASOS PCT: 1 %
Basophils Absolute: 0 10*3/uL (ref 0.0–0.1)
Basophils Relative: 0 %
Eosinophils Absolute: 0.7 10*3/uL (ref 0.0–0.7)
Eosinophils Absolute: 0.7 10*3/uL (ref 0.0–0.7)
Eosinophils Relative: 11 %
Eosinophils Relative: 12 %
HCT: 33.2 % — ABNORMAL LOW (ref 39.0–52.0)
HEMATOCRIT: 36.4 % — AB (ref 39.0–52.0)
Hemoglobin: 12 g/dL — ABNORMAL LOW (ref 13.0–17.0)
Hemoglobin: 10.9 g/dL — ABNORMAL LOW (ref 13.0–17.0)
LYMPHS ABS: 1.8 10*3/uL (ref 0.7–4.0)
LYMPHS PCT: 30 %
Lymphocytes Relative: 32 %
Lymphs Abs: 2.1 10*3/uL (ref 0.7–4.0)
MCH: 26.7 pg (ref 26.0–34.0)
MCH: 26.6 pg (ref 26.0–34.0)
MCHC: 33 g/dL (ref 30.0–36.0)
MCHC: 32.8 g/dL (ref 30.0–36.0)
MCV: 81.1 fL (ref 78.0–100.0)
MCV: 81 fL (ref 78.0–100.0)
MONO ABS: 0.6 10*3/uL (ref 0.1–1.0)
MONO ABS: 0.5 10*3/uL (ref 0.1–1.0)
MONOS PCT: 9 %
MONOS PCT: 8 %
NEUTROS ABS: 3.2 10*3/uL (ref 1.7–7.7)
Neutro Abs: 2.9 10*3/uL (ref 1.7–7.7)
Neutrophils Relative %: 47 %
Neutrophils Relative %: 50 %
PLATELETS: 424 10*3/uL — AB (ref 150–400)
PLATELETS: 394 10*3/uL (ref 150–400)
RBC: 4.49 MIL/uL (ref 4.22–5.81)
RBC: 4.1 MIL/uL — AB (ref 4.22–5.81)
RDW: 13.8 % (ref 11.5–15.5)
RDW: 13.7 % (ref 11.5–15.5)
WBC: 6.8 10*3/uL (ref 4.0–10.5)
WBC: 5.9 10*3/uL (ref 4.0–10.5)

## 2017-10-20 LAB — COMPREHENSIVE METABOLIC PANEL
ALBUMIN: 3.6 g/dL (ref 3.5–5.0)
ALK PHOS: 135 U/L — AB (ref 38–126)
ALT: 20 U/L (ref 0–44)
ANION GAP: 7 (ref 5–15)
AST: 16 U/L (ref 15–41)
BUN: 16 mg/dL (ref 6–20)
CALCIUM: 9.8 mg/dL (ref 8.9–10.3)
CO2: 26 mmol/L (ref 22–32)
CREATININE: 0.43 mg/dL — AB (ref 0.61–1.24)
Chloride: 102 mmol/L (ref 98–111)
GFR calc Af Amer: >60 mL/min (ref >60)
GFR calc non Af Amer: >60 mL/min (ref >60)
GLUCOSE: 85 mg/dL (ref 70–99)
Potassium: 3.8 mmol/L (ref 3.5–5.1)
SODIUM: 135 mmol/L (ref 135–145)
TOTAL PROTEIN: 8.3 g/dL — AB (ref 6.5–8.1)
Total Bilirubin: 0.3 mg/dL (ref 0.3–1.2)

## 2017-10-20 LAB — BASIC METABOLIC PANEL
Anion gap: 6 (ref 5–15)
BUN: 14 mg/dL (ref 6–20)
CO2: 23 mmol/L (ref 22–32)
CREATININE: 0.41 mg/dL — AB (ref 0.61–1.24)
Calcium: 9.1 mg/dL (ref 8.9–10.3)
Chloride: 108 mmol/L (ref 98–111)
GFR calc Af Amer: >60 mL/min (ref >60)
GFR calc non Af Amer: >60 mL/min (ref >60)
GLUCOSE: 78 mg/dL (ref 70–99)
POTASSIUM: 3.7 mmol/L (ref 3.5–5.1)
SODIUM: 137 mmol/L (ref 135–145)

## 2017-10-20 LAB — SEDIMENTATION RATE
SED RATE: 63 mm/h — AB (ref 0–16)
Sed Rate: 57 mm/hr — ABNORMAL HIGH (ref 0–16)

## 2017-10-20 LAB — URINALYSIS, ROUTINE W REFLEX MICROSCOPIC
Bilirubin Urine: NEGATIVE
GLUCOSE, UA: NEGATIVE mg/dL
Ketones, ur: NEGATIVE mg/dL
NITRITE: NEGATIVE
PROTEIN: NEGATIVE mg/dL
Specific Gravity, Urine: 1.014 (ref 1.005–1.030)
WBC, UA: >50 WBC/hpf — ABNORMAL HIGH (ref 0–5)
pH: 6 (ref 5.0–8.0)

## 2017-10-20 LAB — LACTIC ACID, PLASMA: LACTIC ACID, VENOUS: 0.5 mmol/L (ref 0.5–1.9)

## 2017-10-20 LAB — C-REACTIVE PROTEIN: CRP: 5.1 mg/dL — AB (ref <1.0)

## 2017-10-20 MED ORDER — SODIUM CHLORIDE 0.9 % IV SOLN
2.0000 g | INTRAVENOUS | Status: DC
  Administered 2017-10-21: 2 g via INTRAVENOUS
  Filled 2017-10-20: qty 20
  Filled 2017-10-20: qty 2

## 2017-10-20 MED ORDER — BUPRENORPHINE HCL 75 MCG BU FILM
75.0000 ug | ORAL_FILM | Freq: Two times a day (BID) | BUCCAL | Status: DC
  Filled 2017-10-20 (×6): qty 1

## 2017-10-20 MED ORDER — TRAZODONE HCL 50 MG PO TABS
100.0000 mg | ORAL_TABLET | Freq: Every day | ORAL | Status: DC
  Administered 2017-10-20 – 2017-10-22 (×3): 100 mg via ORAL
  Filled 2017-10-20 (×3): qty 2

## 2017-10-20 MED ORDER — BUTALBITAL-APAP-CAFFEINE 50-325-40 MG PO TABS: 2.0000 | ORAL_TABLET | Freq: Two times a day (BID) | ORAL | Status: DC | PRN

## 2017-10-20 MED ORDER — SODIUM CHLORIDE 0.9 % IV SOLN
1.0000 g | Freq: Once | INTRAVENOUS | Status: AC
  Administered 2017-10-21: 1 g via INTRAVENOUS
  Filled 2017-10-20 (×2): qty 10

## 2017-10-20 MED ORDER — SODIUM CHLORIDE 0.9% FLUSH
3.0000 mL | Freq: Two times a day (BID) | INTRAVENOUS | Status: DC
  Administered 2017-10-20 – 2017-10-22 (×2): 3 mL via INTRAVENOUS

## 2017-10-20 MED ORDER — SODIUM CHLORIDE 0.9 % IV BOLUS
1000.0000 mL | Freq: Once | INTRAVENOUS | Status: AC
  Administered 2017-10-20: 1000 mL via INTRAVENOUS

## 2017-10-20 MED ORDER — OXYCODONE-ACETAMINOPHEN 5-325 MG PO TABS
1.0000 | ORAL_TABLET | Freq: Every day | ORAL | Status: DC | PRN
  Administered 2017-10-20 – 2017-10-22 (×2): 1 via ORAL
  Filled 2017-10-20 (×3): qty 1

## 2017-10-20 MED ORDER — PANTOPRAZOLE SODIUM 20 MG PO TBEC
20.0000 mg | DELAYED_RELEASE_TABLET | Freq: Every day | ORAL | Status: DC
  Filled 2017-10-20: qty 1

## 2017-10-20 MED ORDER — METRONIDAZOLE IN NACL 5-0.79 MG/ML-% IV SOLN
500.0000 mg | Freq: Three times a day (TID) | INTRAVENOUS | Status: DC
  Administered 2017-10-20 – 2017-10-22 (×5): 500 mg via INTRAVENOUS
  Filled 2017-10-20 (×5): qty 100

## 2017-10-20 MED ORDER — VANCOMYCIN HCL 500 MG IV SOLR
500.0000 mg | Freq: Three times a day (TID) | INTRAVENOUS | Status: DC
  Administered 2017-10-21 – 2017-10-22 (×5): 500 mg via INTRAVENOUS
  Filled 2017-10-20 (×9): qty 500

## 2017-10-20 MED ORDER — GABAPENTIN 300 MG PO CAPS
300.0000 mg | ORAL_CAPSULE | Freq: Three times a day (TID) | ORAL | Status: DC
  Administered 2017-10-20 – 2017-10-23 (×8): 300 mg via ORAL
  Filled 2017-10-20 (×8): qty 1

## 2017-10-20 MED ORDER — VANCOMYCIN HCL IN DEXTROSE 1-5 GM/200ML-% IV SOLN
1000.0000 mg | Freq: Once | INTRAVENOUS | Status: AC
  Administered 2017-10-20: 1000 mg via INTRAVENOUS
  Filled 2017-10-20: qty 200

## 2017-10-20 MED ORDER — SODIUM CHLORIDE 0.9% FLUSH: 3.0000 mL | INTRAVENOUS | Status: DC | PRN

## 2017-10-20 MED ORDER — POLYETHYLENE GLYCOL 3350 17 G PO PACK
17.0000 g | PACK | Freq: Every day | ORAL | Status: DC
  Administered 2017-10-21 – 2017-10-23 (×3): 17 g via ORAL
  Filled 2017-10-20 (×3): qty 1

## 2017-10-20 MED ORDER — SODIUM CHLORIDE 0.9 % IV SOLN
1.0000 g | Freq: Once | INTRAVENOUS | Status: AC
  Administered 2017-10-20: 1 g via INTRAVENOUS
  Filled 2017-10-20: qty 10

## 2017-10-20 MED ORDER — PIPERACILLIN-TAZOBACTAM 3.375 G IVPB 30 MIN
3.3750 g | Freq: Once | INTRAVENOUS | Status: AC
  Administered 2017-10-20: 3.375 g via INTRAVENOUS
  Filled 2017-10-20: qty 50

## 2017-10-20 MED ORDER — SODIUM CHLORIDE 0.9 % IV SOLN: 250.0000 mL | INTRAVENOUS | Status: DC | PRN
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
## 2017-10-20 NOTE — Progress Notes (Signed)
Pharmacy Antibiotic Note  [name] is a 52 y.o. male admitted on 10/20/2017 with cellulitis/possible osteomyelitis.  Pharmacy has been consulted for Vancomycin dosing.  Plan: Vancomycin 1000mg  given in ED, then 500mg  IV every 8 hours.  Goal trough 15-20 mcg/mL.  Also on ceftriaxone 2gm IV q24h Flagyl 500mg  IV q8h F/U cxs and clinical progress Monitor V/S, labs, and levels as indicated  Height: 6\' 1"  (185.4 cm) Weight: 131 lb (59.4 kg) IBW/kg (Calculated) : 79.9  Temp (24hrs), Avg:98.6 F (37 C), Min:98.6 F (37 C), Max:98.6 F (37 C)  Recent Labs  Lab 10/20/17 1529 10/20/17 1712  WBC 6.8  --   CREATININE 0.43*  --   LATICACIDVEN  --  0.5    Estimated Creatinine Clearance: 90.8 mL/min (A) (by C-G formula based on SCr of 0.43 mg/dL (L)).    Allergies  Allergen Reactions  . Niacin And Related Hives  . Other Hives    From work uniform  . Shellfish Allergy Swelling    Lip swelling    Antimicrobials this admission: Vancomycin 7/16 >> Ceftriaxone 7/16 >> Metronidazole 7/16>>  Dose adjustments this admission: n/a  Microbiology results: 7/16 BCx: pending 7/16 wound Cx:   MRSA PCR:  Thank you for allowing pharmacy to be a part of this patient's care.  Isac Sarna, BS [place], [place] Clinical Pharmacist Pager [phone] 10/20/2017 7:12 PM

## 2017-10-21 ENCOUNTER — Inpatient Hospital Stay (HOSPITAL_COMMUNITY): Payer: Medicare Other

## 2017-10-21 ENCOUNTER — Other Ambulatory Visit: Payer: Self-pay

## 2017-10-21 DIAGNOSIS — L0390 Cellulitis, unspecified: Secondary | ICD-10-CM

## 2017-10-21 DIAGNOSIS — M869 Osteomyelitis, unspecified: Secondary | ICD-10-CM

## 2017-10-21 DIAGNOSIS — G8254 Quadriplegia, C5-C7 incomplete: Secondary | ICD-10-CM

## 2017-10-21 DIAGNOSIS — T83511A Infection and inflammatory reaction due to indwelling urethral catheter, initial encounter: Secondary | ICD-10-CM

## 2017-10-21 DIAGNOSIS — N390 Urinary tract infection, site not specified: Secondary | ICD-10-CM

## 2017-10-21 LAB — BASIC METABOLIC PANEL
Anion gap: 7 (ref 5–15)
BUN: 13 mg/dL (ref 6–20)
CHLORIDE: 108 mmol/L (ref 98–111)
CO2: 24 mmol/L (ref 22–32)
Calcium: 9 mg/dL (ref 8.9–10.3)
Creatinine, Ser: 0.39 mg/dL — ABNORMAL LOW (ref 0.61–1.24)
GFR calc Af Amer: >60 mL/min (ref >60)
GLUCOSE: 86 mg/dL (ref 70–99)
POTASSIUM: 3.8 mmol/L (ref 3.5–5.1)
Sodium: 139 mmol/L (ref 135–145)

## 2017-10-21 LAB — C-REACTIVE PROTEIN: CRP: 4.2 mg/dL — ABNORMAL HIGH (ref <1.0)

## 2017-10-21 LAB — CBC
HEMATOCRIT: 34.3 % — AB (ref 39.0–52.0)
HEMOGLOBIN: 11.1 g/dL — AB (ref 13.0–17.0)
MCH: 26.5 pg (ref 26.0–34.0)
MCHC: 32.4 g/dL (ref 30.0–36.0)
MCV: 81.9 fL (ref 78.0–100.0)
Platelets: 396 10*3/uL (ref 150–400)
RBC: 4.19 MIL/uL — ABNORMAL LOW (ref 4.22–5.81)
RDW: 13.8 % (ref 11.5–15.5)
WBC: 6 10*3/uL (ref 4.0–10.5)

## 2017-10-21 LAB — HEMOGLOBIN A1C
HEMOGLOBIN A1C: 5.6 % (ref 4.8–5.6)
Mean Plasma Glucose: 114.02 mg/dL

## 2017-10-21 LAB — PREALBUMIN: Prealbumin: 20.4 mg/dL (ref 18–38)

## 2017-10-21 MED ORDER — PANTOPRAZOLE SODIUM 40 MG PO TBEC
40.0000 mg | DELAYED_RELEASE_TABLET | Freq: Every day | ORAL | Status: DC
  Administered 2017-10-21 – 2017-10-23 (×3): 40 mg via ORAL
  Filled 2017-10-21 (×3): qty 1

## 2017-10-21 MED ORDER — VANCOMYCIN HCL 500 MG IV SOLR
INTRAVENOUS | Status: AC
  Filled 2017-10-21: qty 1000

## 2017-10-21 MED ORDER — ENSURE ENLIVE PO LIQD
237.0000 mL | Freq: Three times a day (TID) | ORAL | Status: DC
  Administered 2017-10-21 – 2017-10-23 (×4): 237 mL via ORAL

## 2017-10-21 MED ORDER — LORATADINE 10 MG PO TABS
10.0000 mg | ORAL_TABLET | Freq: Every day | ORAL | Status: DC
  Administered 2017-10-21 – 2017-10-22 (×2): 10 mg via ORAL
  Filled 2017-10-21 (×2): qty 1

## 2017-10-21 MED ORDER — JUVEN PO PACK
1.0000 | PACK | Freq: Two times a day (BID) | ORAL | Status: DC
  Administered 2017-10-22 – 2017-10-23 (×2): 1 via ORAL
  Filled 2017-10-21 (×2): qty 1
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
## 2017-10-21 NOTE — Care Management (Signed)
CSW consult in for "access to medication needs." This is CM function. CM aware.

## 2017-10-21 NOTE — Progress Notes (Signed)
MRI foot shows osteomyelitis of phalanges of great toe, first disease sesamoids, first metatarsal and medial cuneiform and medial portion of the navicular.  Also has diffuse edema which appears to be reactive versus myositis.  No underlying abscess noted. Spoke with Dr. Veverly Fells (orthopedics in [place]).  He recommended that Dr Sharol Given primarily does amputation is not back into office until 7/22 and since patient is hemodynamically stable without signs of active infection, sepsis or history of diabetes he can be treated with empiric antibiotic and dry dressing for now and see Dr. Sharol Given in the office sometime next week.  He will inform Dr. Sharol Given to schedule an appointment. Discussed with patient who wants to continue antibiotic at this time and is hesitant on undergoing any surgical procedure or amputation at present.

## 2017-10-21 NOTE — Consult Note (Addendum)
Consult requested for foot wound.  Reviewed photos in the EMR.  Pt has necrotic areas to toes.  X-ray indicates, "Possible osteomyelitis involving the distal tuft of the distal phalanx of the great toe."  This complex medical condition is beyond the scope of pratice for the [facility] nurse.  Please refer to ortho or vascular service for further plan of care. Xeroform gauze order placed until further recommendations are available from one of these teams. Consult requested for sacrum; called bedside nurse to discuss via phone.  She assessed the location and states there is pink intact scar tissue without an open wound where a previous pressure injury has healed. No role for topical treatment at this time for this site. Please re-consult if further assistance is needed.  Thank-you,  Julien Girt MSN, [place], [place], [place], [place]
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
## 2017-10-22 DIAGNOSIS — M86172 Other acute osteomyelitis, left ankle and foot: Principal | ICD-10-CM | POA: Diagnosis present

## 2017-10-22 LAB — VANCOMYCIN, TROUGH: Vancomycin Tr: 9 ug/mL — ABNORMAL LOW (ref 15–20)

## 2017-10-22 MED ORDER — VANCOMYCIN HCL IN DEXTROSE 750-5 MG/150ML-% IV SOLN
750.0000 mg | Freq: Three times a day (TID) | INTRAVENOUS | Status: DC
  Administered 2017-10-22 – 2017-10-23 (×3): 750 mg via INTRAVENOUS
  Filled 2017-10-22 (×13): qty 150

## 2017-10-22 MED ORDER — LEVOFLOXACIN IN D5W 750 MG/150ML IV SOLN
750.0000 mg | INTRAVENOUS | Status: DC
  Administered 2017-10-22: 750 mg via INTRAVENOUS
  Filled 2017-10-22: qty 150
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
## 2017-10-22 NOTE — Care Management Note (Addendum)
Case Management Note  Patient Details  Name: [name] MRN: [id number] Date of Birth: [birth year]  Subjective/Objective:  Wound. MRI + for osteomyelitis of phalanges. Patient is WC bound. From home with daughter.  Has aide and RN with [facility]. Patient has been evaluated for power wheelchair. Will check with [facility] on progress.  Patient reports he has also recently been evaluated for CAPS program.  Services to start soon.    Patient has WC, lift, and hospital bed.             Action/Plan:DC home in care of family with resumption of services. Potential need for IV antibiotics. CM following.   Expected Discharge Date:  10/23/17               Expected Discharge Plan:  [place]  In-House Referral:     Discharge planning Services  CM Consult  Post Acute Care Choice:  Home Health, Resumption of Svcs/PTA Provider Choice offered to:     DME Arranged:    DME Agency:     HH Arranged:    [place] Agency:  [place]  Status of Service:  In process, will continue to follow  If discussed at Long Length of Stay Meetings, dates discussed:    Additional Comments:  Soo Steelman, Chauncey Reading, RN 10/22/2017, 9:24 AM

## 2017-10-22 NOTE — Progress Notes (Addendum)
Pharmacy Antibiotic Note  [name] is a 52 y.o. male admitted on 10/20/2017 with cellulitis/possible osteomyelitis.  Pharmacy has been consulted for Vancomycin/Levaquin dosing.  Plan: Vanco trough of 9. Increase vancomycin to 750 mg IV every 8 hours. Goal trough 15/20 mcg/mL Levaquin 750 mg IV daily Monitor labs, c/s, and patient improvement   Height: 6\' 1"  (185.4 cm) Weight: 131 lb (59.4 kg) IBW/kg (Calculated) : 79.9  Temp (24hrs), Avg:98.2 F (36.8 C), Min:98.2 F (36.8 C), Max:98.3 F (36.8 C)  Recent Labs  Lab 10/20/17 1529 10/20/17 1712 10/20/17 2003 10/21/17 0403  WBC 6.8  --  5.9 6.0  CREATININE 0.43*  --  0.41* 0.39*  LATICACIDVEN  --  0.5  --   --     Estimated Creatinine Clearance: 90.8 mL/min (A) (by C-G formula based on SCr of 0.39 mg/dL (L)).    Allergies  Allergen Reactions  . Niacin And Related Hives  . Other Hives    From work uniform  . Shellfish Allergy Swelling    Lip swelling    Antimicrobials this admission: Levaquin 7/18 >>  Vancomycin 7/16 >> Ceftriaxone 7/16 >>7/18 Metronidazole 7/16>>7/18  Dose adjustments this admission: n/a  Microbiology results: 7/16 BCx: ngtd 7/16 wound Cx:  pending 7/16 urine Cx: pseudomonas/klebsiella pneumoniae  Thank you for allowing pharmacy to be a part of this patient's care.  Margot Ables, PharmD Clinical Pharmacist 10/22/2017 8:31 AM

## 2017-10-23 DIAGNOSIS — R636 Underweight: Secondary | ICD-10-CM | POA: Diagnosis present

## 2017-10-23 DIAGNOSIS — G8250 Quadriplegia, unspecified: Secondary | ICD-10-CM

## 2017-10-23 LAB — AEROBIC CULTURE  (SUPERFICIAL SPECIMEN)

## 2017-10-23 LAB — URINE CULTURE: Culture: >=100000 — AB

## 2017-10-23 LAB — AEROBIC CULTURE W GRAM STAIN (SUPERFICIAL SPECIMEN)

## 2017-10-23 MED ORDER — SODIUM CHLORIDE 0.9 % IV SOLN
1.0000 g | Freq: Three times a day (TID) | INTRAVENOUS | Status: DC
  Filled 2017-10-23 (×10): qty 1

## 2017-10-23 MED ORDER — VANCOMYCIN IV (FOR PTA / DISCHARGE USE ONLY): 1000.0000 mg | Freq: Two times a day (BID) | INTRAVENOUS | 0 refills | Status: AC

## 2017-10-23 MED ORDER — SORBITOL 70 % SOLN
960.0000 mL | TOPICAL_OIL | Freq: Once | ORAL | Status: AC
  Administered 2017-10-23: 960 mL via RECTAL
  Filled 2017-10-23: qty 473

## 2017-10-23 MED ORDER — VANCOMYCIN HCL IN DEXTROSE 1-5 GM/200ML-% IV SOLN
1000.0000 mg | Freq: Two times a day (BID) | INTRAVENOUS | Status: DC
  Administered 2017-10-23: 1000 mg via INTRAVENOUS
  Filled 2017-10-23: qty 200

## 2017-10-23 MED ORDER — ENSURE ENLIVE PO LIQD: 237.0000 mL | Freq: Three times a day (TID) | ORAL | 12 refills | Status: DC

## 2017-10-23 MED ORDER — JUVEN PO PACK: 1.0000 | PACK | Freq: Two times a day (BID) | ORAL | 0 refills | Status: DC

## 2017-10-23 NOTE — Care Management Important Message (Signed)
Important Message  Patient Details  Name: LAMONTE HARTT MRN: [id number] Date of Birth: 1965-12-22   Medicare Important Message Given:  Yes    Nesanel Aguila, Chauncey Reading, RN 10/23/2017, 12:44 PM

## 2017-10-23 NOTE — Discharge Summary (Addendum)
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 RESISTANT Resistant     ERYTHROMYCIN >=8 RESISTANT Resistant     GENTAMICIN <=0.5 SENSITIVE Sensitive     OXACILLIN RESISTANT Resistant     TETRACYCLINE <=1 SENSITIVE Sensitive     VANCOMYCIN 1 SENSITIVE Sensitive     TRIMETH/SULFA <=10 SENSITIVE Sensitive     CLINDAMYCIN <=0.25 SENSITIVE Sensitive     RIFAMPIN <=0.5 SENSITIVE Sensitive     Inducible Clindamycin NEGATIVE Sensitive     * MODERATE METHICILLIN RESISTANT STAPHYLOCOCCUS AUREUS  Blood Cultures x 2 sites     Status: None (Preliminary result)   Collection Time: 10/20/17  7:41 PM  Result Value Ref Range Status   Specimen Description BLOOD LEFT ARM   Final   Special Requests   Final    BOTTLES DRAWN AEROBIC ONLY Blood Culture adequate volume   Culture   Final    NO GROWTH 2 DAYS Performed at [facility], [address], [place], [place] [address]    Report Status PENDING  Incomplete  Blood Cultures x 2 sites     Status: None (Preliminary result)   Collection Time: 10/20/17  8:00 PM  Result Value Ref Range Status   Specimen Description BLOOD LEFT ARM  Final   Special Requests   Final    BOTTLES DRAWN AEROBIC ONLY Blood Culture adequate volume   Culture   Final    NO GROWTH 2 DAYS Performed at [facility], [address] [place], [place] [address]    Report Status PENDING  Incomplete     Time coordinating discharge: 35 minutes  SIGNED:   Louellen Molder, MD  [facility] 10/23/2017, 1:05 PM Pager   If 7PM-7AM, please contact night-coverage [email or web address] Password [email or web address]

## 2017-10-23 NOTE — Progress Notes (Addendum)
PHARMACY CONSULT NOTE FOR:  OUTPATIENT  PARENTERAL ANTIBIOTIC THERAPY (OPAT)  Indication: Osteomyelitis Regimen: Vancomycin 1000 mg IV every 12 hours End date: Vancomycin thru end of 8/13   IV antibiotic discharge orders are pended. To discharging provider:  please sign these orders via discharge navigator,  Select New Orders & click on the button choice - Manage This Unsigned Work.     Thank you for allowing pharmacy to be a part of this patient's care.  Ramond Craver 10/23/2017, 11:46 AM

## 2017-10-23 NOTE — Discharge Instructions (Signed)
Bone and Joint Infections, Adult Bone infections (osteomyelitis) and joint infections (septic arthritis) occur when bacteria or other germs get inside a bone or a joint. This can happen if you have an infection in another part of your body that spreads through your blood. Germs from your skin or from outside of your body can also cause this type of infection if you have a wound or a broken bone (fracture) that breaks the skin. Anyone can get a bone infection or joint infection. You may be more likely to get this type of infection if you have a condition, such as diabetes, that lowers your ability to fight infection or increases your chances of getting an infection. Bone and joint infections can cause damage, and they can spread to other areas of your body. They need to be treated quickly. What are the causes? Most bone and joint infections are caused by bacteria. They can also be caused by other germs, such as viruses and funguses. What increases the risk? This condition is more likely to develop in:  People who recently had surgery, especially bone or joint surgery.  People who have a long-term (chronic) disease, such as: ? HIV (human immunodeficiency virus). ? Diabetes. ? Rheumatoid arthritis. ? Sickle cell anemia.  Elderly people.  People who take medicines that block or weaken the body's defense system (immune system).  People who have a condition that reduces their blood flow.  People who are on kidney dialysis.  People who have an artificial joint.  People who have had a joint or bone repaired with plates or screws (surgical hardware).  People who use or abuse IV drugs.  People who have had trauma, such as stepping on a nail.  What are the signs or symptoms? Symptoms vary depending on the type and location of your infection. Common symptoms of bone and joint infections include:  Fever and chills.  Redness and warmth.  Swelling.  Pain and stiffness.  Drainage of fluid  or pus near the infection.  Weight loss and fatigue.  Decreased ability to use a hand or foot.  How is this diagnosed? This condition may be diagnosed based on symptoms, medical history, a physical exam, and diagnostic tests. Tests can help to identify the cause of the infection. You may have various tests, such as:  A sample of tissue, fluid, or blood taken to be examined under a microscope.  A procedure to remove fluid from the infected joint with a needle (joint aspiration) for testing in a lab.  Pus or discharge swabbed from a wound for testing to identify germs and to determine what type of medicine will kill them (culture and sensitivity).  Blood tests to look for evidence of infection and inflammation (biomarkers).  Imaging studies to determine how severe the bone or joint infection is. These may include: ? X-rays. ? CT scan. ? MRI. ? Bone scan.  How is this treated? Treatment depends on the cause and type of infection. Antibiotic medicines are usually the first treatment for a bone or joint infection. Treatment with antibiotics may include:  Getting IV antibiotics. This may be done in a hospital at first. You may have to continue IV antibiotics at home for several weeks. You may also have to take antibiotics by mouth for several weeks after that.  Taking more than one kind of antibiotic. Treatment may start with a type of antibiotic that works against many different bacteria (broad spectrumantibiotics). IV antibiotics may be changed if tests show that another   type may work better.  Other treatments may include:  Draining fluid from the joint by placing a needle into it (aspiration).  Surgery to remove: ? Dead or dying tissue from a bone or joint. ? An infected artificial joint. ? Infected plates or screws that were used to repair a broken bone.  Follow these instructions at home:  Take medicines only as directed by your health care provider.  Take your antibiotic  medicine as directed by your health care provider. Finish the antibiotic even if you start to feel better.  Follow instructions from your health care provider about how to take IV antibiotics at home.  Ask your health care provider if you have any restrictions on your activities.  Keep all follow-up visits as directed by your health care provider. This is important. Contact a health care provider if:  You have a fever or chills.  You have redness, warmth, pain, or swelling that returns after treatment. Get help right away if:  You have rapid breathing or you have trouble breathing.  You have chest pain.  You cannot drink fluids or make urine.  The affected arm or leg swells, changes color, or turns blue. This information is not intended to replace advice given to you by your health care provider. Make sure you discuss any questions you have with your health care provider. Document Released: 03/24/2005 Document Revised: 08/30/2015 Document Reviewed: 03/22/2014 Elsevier Interactive Patient Education  2018 Elsevier Inc.  

## 2017-10-23 NOTE — Progress Notes (Signed)
At bedside to place PICC and [facility] already there doing the procedure.

## 2017-10-23 NOTE — Care Management Note (Signed)
Case Management Note  Patient Details  Name: [name] MRN: [id number] Date of Birth: [birth year]  Subjective/Objective:                    Action/Plan: Patient discharging home today with IV ABX. Discussed with daughter. [facility] will deliver antibiotics and teach family to administer.  Bedside RN to administer dose of Vancomycin today prior to DC and arrange EMS tranport home.  Expected Discharge Date:  10/23/17               Expected Discharge Plan:  [place]  In-House Referral:     Discharge planning Services  CM Consult  Post Acute Care Choice:  Home Health, Resumption of Svcs/PTA Provider Choice offered to:  Patient, Adult Children  DME Arranged:    DME Agency:     HH Arranged:  RN, IV Antibiotics HH Agency:  [facility]  Status of Service:  Completed, signed off  If discussed at [place] of Stay Meetings, dates discussed:    Additional Comments:  Claude Waldman, Chauncey Reading, RN 10/23/2017, 12:40 PM

## 2017-10-25 LAB — CULTURE, BLOOD (ROUTINE X 2)
CULTURE: NO GROWTH
CULTURE: NO GROWTH
CULTURE: NO GROWTH
Culture: NO GROWTH
SPECIAL REQUESTS: ADEQUATE
SPECIAL REQUESTS: ADEQUATE
Special Requests: ADEQUATE

## 2017-10-29 ENCOUNTER — Other Ambulatory Visit (HOSPITAL_COMMUNITY)
Admission: RE | Admit: 2017-10-29 | Discharge: 2017-10-29 | Disposition: A | Discharge status: Home / Self Care | Payer: Medicare Other | Source: Other Acute Inpatient Hospital | Attending: Emergency Medicine | Admitting: Emergency Medicine

## 2017-10-29 DIAGNOSIS — M86172 Other acute osteomyelitis, left ankle and foot: Secondary | ICD-10-CM | POA: Diagnosis present

## 2017-10-29 LAB — VANCOMYCIN, TROUGH: VANCOMYCIN TR: 14 ug/mL — AB (ref 15–20)

## 2017-10-29 LAB — BASIC METABOLIC PANEL
Anion gap: 6 (ref 5–15)
BUN: 19 mg/dL (ref 6–20)
CO2: 29 mmol/L (ref 22–32)
CREATININE: 0.58 mg/dL — AB (ref 0.61–1.24)
Calcium: 9.5 mg/dL (ref 8.9–10.3)
Chloride: 102 mmol/L (ref 98–111)
GFR calc Af Amer: >60 mL/min (ref >60)
GLUCOSE: 95 mg/dL (ref 70–99)
Potassium: 4.3 mmol/L (ref 3.5–5.1)
SODIUM: 137 mmol/L (ref 135–145)

## 2017-10-29 LAB — CBC WITH DIFFERENTIAL/PLATELET
Basophils Absolute: 0 10*3/uL (ref 0.0–0.1)
Basophils Relative: 1 %
EOS ABS: 1.3 10*3/uL — AB (ref 0.0–0.7)
EOS PCT: 19 %
HCT: 34.8 % — ABNORMAL LOW (ref 39.0–52.0)
Hemoglobin: 11.6 g/dL — ABNORMAL LOW (ref 13.0–17.0)
LYMPHS ABS: 2.2 10*3/uL (ref 0.7–4.0)
LYMPHS PCT: 34 %
MCH: 27.1 pg (ref 26.0–34.0)
MCHC: 33.3 g/dL (ref 30.0–36.0)
MCV: 81.3 fL (ref 78.0–100.0)
MONO ABS: 0.4 10*3/uL (ref 0.1–1.0)
Monocytes Relative: 6 %
Neutro Abs: 2.6 10*3/uL (ref 1.7–7.7)
Neutrophils Relative %: 40 %
Platelets: 314 10*3/uL (ref 150–400)
RBC: 4.28 MIL/uL (ref 4.22–5.81)
RDW: 14 % (ref 11.5–15.5)
WBC: 6.5 10*3/uL (ref 4.0–10.5)

## 2017-10-30 LAB — SEDIMENTATION RATE: Sed Rate: 35 mm/hr — ABNORMAL HIGH (ref 0–16)

## 2017-10-30 LAB — C-REACTIVE PROTEIN: CRP: 1.4 mg/dL — ABNORMAL HIGH (ref <1.0)

## 2017-11-16 ENCOUNTER — Ambulatory Visit (INDEPENDENT_AMBULATORY_CARE_PROVIDER_SITE_OTHER): Payer: Medicare Other | Admitting: Orthopedic Surgery

## 2017-11-16 ENCOUNTER — Encounter (INDEPENDENT_AMBULATORY_CARE_PROVIDER_SITE_OTHER): Payer: Self-pay | Admitting: Orthopedic Surgery

## 2017-11-16 VITALS — Ht 73.0 in | Wt 131.0 lb

## 2017-11-16 DIAGNOSIS — G8250 Quadriplegia, unspecified: Secondary | ICD-10-CM

## 2017-11-16 DIAGNOSIS — M86172 Other acute osteomyelitis, left ankle and foot: Secondary | ICD-10-CM

## 2017-11-16 DIAGNOSIS — S14109S Unspecified injury at unspecified level of cervical spinal cord, sequela: Secondary | ICD-10-CM

## 2017-11-23 ENCOUNTER — Encounter (INDEPENDENT_AMBULATORY_CARE_PROVIDER_SITE_OTHER): Payer: Self-pay | Admitting: Orthopedic Surgery
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
## 2017-12-08 ENCOUNTER — Encounter: Payer: Medicare Other | Attending: Physician Assistant | Admitting: Physician Assistant

## 2017-12-08 DIAGNOSIS — E440 Moderate protein-calorie malnutrition: Secondary | ICD-10-CM | POA: Insufficient documentation

## 2017-12-08 DIAGNOSIS — M86172 Other acute osteomyelitis, left ankle and foot: Secondary | ICD-10-CM | POA: Diagnosis not present

## 2017-12-08 DIAGNOSIS — F419 Anxiety disorder, unspecified: Secondary | ICD-10-CM | POA: Insufficient documentation

## 2017-12-08 DIAGNOSIS — Z931 Gastrostomy status: Secondary | ICD-10-CM | POA: Diagnosis not present

## 2017-12-08 DIAGNOSIS — M069 Rheumatoid arthritis, unspecified: Secondary | ICD-10-CM | POA: Insufficient documentation

## 2017-12-08 DIAGNOSIS — L97524 Non-pressure chronic ulcer of other part of left foot with necrosis of bone: Secondary | ICD-10-CM | POA: Insufficient documentation

## 2017-12-08 DIAGNOSIS — G8254 Quadriplegia, C5-C7 incomplete: Secondary | ICD-10-CM | POA: Insufficient documentation

## 2017-12-08 DIAGNOSIS — L97512 Non-pressure chronic ulcer of other part of right foot with fat layer exposed: Secondary | ICD-10-CM | POA: Diagnosis not present

## 2017-12-09 ENCOUNTER — Other Ambulatory Visit: Payer: Self-pay | Admitting: Physician Assistant

## 2017-12-09 DIAGNOSIS — S81801A Unspecified open wound, right lower leg, initial encounter: Secondary | ICD-10-CM

## 2017-12-09 NOTE — Progress Notes (Signed)
[name], [name] ([id number]) Visit Report for 12/08/2017 Abuse/Suicide Risk Screen Details Patient Name: [name], [name] Date of Service: 12/08/2017 1:00 PM Medical Record Number: [id number] Patient Account Number: [id number] Date of Birth/Sex: 04-17-1965 (52 y.o. M) Treating RN: Roger Shelter Primary Care Ardythe Klute: [name] Other Clinician: Referring Koltyn Kelsay: [name] Treating Sourish Allender/Extender: Melburn Hake, HOYT Weeks in Treatment: 0 Abuse/Suicide Risk Screen Items Answer ABUSE/SUICIDE RISK SCREEN: Has anyone close to you tried to hurt or harm you recentlyo No Do you feel uncomfortable with anyone in your familyo No Has anyone forced you do things that you didnot want to doo No Do you have any thoughts of harming yourselfo No Patient displays signs or symptoms of abuse and/or neglect. No Electronic Signature(s) Signed: 12/08/2017 4:40:26 PM By: Roger Shelter Entered By: Roger Shelter on 12/08/2017 14:04:41 [name] ([id number]) -------------------------------------------------------------------------------- Activities of Daily Living Details Patient Name: [name] Date of Service: 12/08/2017 1:00 PM Medical Record Number: [id number] Patient Account Number: [id number] Date of Birth/Sex: 05/21/1965 (51 y.o. M) Treating RN: Roger Shelter Primary Care Travers Goodley: [name] Other Clinician: Referring Axell Trigueros: [name] Treating Calvyn Kurtzman/Extender: Melburn Hake, HOYT Weeks in Treatment: 0 Activities of Daily Living Items Answer Activities of Daily Living (Please select one for each item) Drive Automobile Not Able Take Medications Not Able Use Telephone Not Able Care for Appearance Not Able Use Toilet Not Able Manus Rudd / Shower Not Able Dress Self Not Able Feed Self Not Able Walk Not Able Get In / Out Bed Not Able Housework Not Able Prepare Meals Not Able Handle Money Not Able Shop for Self Not Able Electronic Signature(s) Signed:  12/08/2017 4:40:26 PM By: Roger Shelter Entered By: Roger Shelter on 12/08/2017 14:05:24 [name] ([id number]) -------------------------------------------------------------------------------- Education Assessment Details Patient Name: [name] Date of Service: 12/08/2017 1:00 PM Medical Record Number: [id number] Patient Account Number: [id number] Date of Birth/Sex: 01-08-1966 (51 y.o. M) Treating RN: Roger Shelter Primary Care Helayna Dun: [name] Other Clinician: Referring Fable Huisman: [name] Treating Anwar Sakata/Extender: Melburn Hake, HOYT Weeks in Treatment: 0 Primary Learner Assessed: Patient Learning Preferences/Education Level/Primary Language Learning Preference: Explanation Highest Education Level: College or Above Preferred Language: English Cognitive Barrier Assessment/Beliefs Language Barrier: No Translator Needed: No Memory Deficit: No Emotional Barrier: No Cultural/Religious Beliefs Affecting Medical Care: No Physical Barrier Assessment Impaired Vision: No Impaired Hearing: No Decreased Hand dexterity: No Knowledge/Comprehension Assessment Knowledge Level: High Comprehension Level: High Ability to understand written High instructions: Ability to understand verbal High instructions: Motivation Assessment Anxiety Level: Calm Cooperation: Cooperative Education Importance: Acknowledges Need Interest in Health Problems: Asks Questions Perception: Coherent Willingness to Engage in Self- High Management Activities: Readiness to Engage in Self- High Management Activities: Electronic Signature(s) Signed: 12/08/2017 4:40:26 PM By: Roger Shelter Entered By: Roger Shelter on 12/08/2017 14:05:57 [name] ([id number]) -------------------------------------------------------------------------------- Fall Risk Assessment Details Patient Name: [name] Date of Service: 12/08/2017 1:00 PM Medical Record Number:  [id number] Patient Account Number: [id number] Date of Birth/Sex: Mar 09, 1966 (51 y.o. M) Treating RN: Roger Shelter Primary Care Yuette Putnam: [name] Other Clinician: Referring Jori Frerichs: [name] Treating Tylie Golonka/Extender: Melburn Hake, HOYT Weeks in Treatment: 0 Fall Risk Assessment Items Have you had 2 or more falls in the last 12 monthso 0 No Have you had any fall that resulted in injury in the last 12 monthso 0 No FALL RISK ASSESSMENT: History of falling - immediate or within 3 months 0 No Secondary diagnosis 0 No Ambulatory aid None/bed rest/wheelchair/nurse 0 No Crutches/cane/walker 0 No Furniture 0  No IV Access/Saline Lock 0 No Gait/Training Normal/bed rest/immobile 0 No Weak 0 No Impaired 0 No Mental Status Oriented to own ability 0 No Electronic Signature(s) Signed: 12/08/2017 4:40:26 PM By: Roger Shelter Entered By: Roger Shelter on 12/08/2017 14:07:01 [name] ([id number]) -------------------------------------------------------------------------------- Foot Assessment Details Patient Name: [name] Date of Service: 12/08/2017 1:00 PM Medical Record Number: [id number] Patient Account Number: [id number] Date of Birth/Sex: 03/14/1966 (51 y.o. M) Treating RN: Roger Shelter Primary Care Arlean Thies: [name] Other Clinician: Referring Dondi Burandt: [name] Treating Bama Hanselman/Extender: Melburn Hake, HOYT Weeks in Treatment: 0 Foot Assessment Items Site Locations + = Sensation present, - = Sensation absent, C = Callus, U = Ulcer R = Redness, W = Warmth, M = Maceration, PU = Pre-ulcerative lesion F = Fissure, S = Swelling, D = Dryness Assessment Right: Left: Other Deformity: No No Prior Foot Ulcer: No No Prior Amputation: No No Charcot Joint: No No Ambulatory Status: Non-ambulatory Assistance Device: Stretcher Gait: Electronic Signature(s) Signed: 12/08/2017 4:40:26 PM By: Roger Shelter Entered By: Roger Shelter on 12/08/2017  14:08:19 [name] ([id number]) -------------------------------------------------------------------------------- Nutrition Risk Assessment Details Patient Name: [name] Date of Service: 12/08/2017 1:00 PM Medical Record Number: [id number] Patient Account Number: [id number] Date of Birth/Sex: 1965-12-02 (51 y.o. M) Treating RN: Roger Shelter Primary Care Ophelia Sipe: [name] Other Clinician: Referring Kashayla Ungerer: [name] Treating Naome Brigandi/Extender: Melburn Hake, HOYT Weeks in Treatment: 0 Height (in): 72 Weight (lbs): Body Mass Index (BMI): Nutrition Risk Assessment Items NUTRITION RISK SCREEN: I have an illness or condition that made me change the kind and/or amount of 0 No food I eat I eat fewer than two meals per day 0 No I eat few fruits and vegetables, or milk products 0 No I have three or more drinks of beer, liquor or wine almost every day 0 No I have tooth or mouth problems that make it hard for me to eat 0 No I don't always have enough money to buy the food I need 0 No I eat alone most of the time 0 No I take three or more different prescribed or over-the-counter drugs a day 0 No Without wanting to, I have lost or gained 10 pounds in the last six months 0 No I am not always physically able to shop, cook and/or feed myself 0 No Nutrition Protocols Good Risk Protocol 0 No interventions needed Moderate Risk Protocol Electronic Signature(s) Signed: 12/08/2017 4:40:26 PM By: Roger Shelter Entered By: Roger Shelter on 12/08/2017 14:07:09

## 2017-12-10 ENCOUNTER — Ambulatory Visit (INDEPENDENT_AMBULATORY_CARE_PROVIDER_SITE_OTHER): Payer: Medicare Other | Admitting: Orthopedic Surgery

## 2017-12-10 NOTE — Progress Notes (Signed)
[name] ([id number]) Visit Report for 12/08/2017 Allergy List Details Patient Name: [name] Date of Service: 12/08/2017 1:00 PM Medical Record Number: [id number] Patient Account Number: [id number] Date of Birth/Sex: 1965/10/04 (52 y.o. M) Treating RN: Roger Shelter Primary Care Mirayah Wren: [name] Other Clinician: Referring Micha Erck: [name] Treating Nylen Creque/Extender: STONE III, HOYT Weeks in Treatment: 0 Allergies Active Allergies niacin shrimp Allergy Notes Electronic Signature(s) Signed: 12/08/2017 4:40:26 PM By: Roger Shelter Entered By: Roger Shelter on 12/08/2017 13:50:10 [name] ([id number]) -------------------------------------------------------------------------------- Arrival Information Details Patient Name: [name] Date of Service: 12/08/2017 1:00 PM Medical Record Number: [id number] Patient Account Number: [id number] Date of Birth/Sex: [birth year] (51 y.o. M) Treating RN: Roger Shelter Primary Care Bradan Congrove: [name] Other Clinician: Referring Lex Linhares: [name] Treating Tammara Massing/Extender: Melburn Hake, HOYT Weeks in Treatment: 0 Visit Information Patient Arrived: Stretcher Arrival Time: 13:42 Accompanied By: self Transfer Assistance: Stretcher Patient Identification Verified: Yes Secondary Verification Process Completed: Yes Electronic Signature(s) Signed: 12/08/2017 4:40:26 PM By: Roger Shelter Entered By: Roger Shelter on 12/08/2017 13:45:52 [name] ([id number]) -------------------------------------------------------------------------------- Clinic Level of Care Assessment Details Patient Name: [name] Date of Service: 12/08/2017 1:00 PM Medical Record Number: [id number] Patient Account Number: [id number] Date of Birth/Sex: 30-Sep-1965 (51 y.o. M) Treating RN: Montey Hora Primary Care Kanitra Purifoy: [name] Other Clinician: Referring Maksim Peregoy: [name] Treating Ulyses Panico/Extender: Melburn Hake, HOYT Weeks in Treatment: 0 Clinic Level of Care Assessment Items TOOL 2 Quantity Score []  - Use when only an EandM is performed on the INITIAL visit 0 ASSESSMENTS - Nursing Assessment / Reassessment X - General Physical Exam (combine w/ comprehensive assessment (listed just below) when 1 20 performed on new pt. evals) X- 1 25 Comprehensive Assessment (HX, ROS, Risk Assessments, Wounds Hx, etc.) ASSESSMENTS - Wound and Skin Assessment / Reassessment []  - Simple Wound Assessment / Reassessment - one wound 0 X- 3 5 Complex Wound Assessment / Reassessment - multiple wounds []  - 0 Dermatologic / Skin Assessment (not related to wound area) ASSESSMENTS - Ostomy and/or Continence Assessment and Care []  - Incontinence Assessment and Management 0 []  - 0 Ostomy Care Assessment and Management (repouching, etc.) PROCESS - Coordination of Care X - Simple Patient / Family Education for ongoing care 1 15 []  - 0 Complex (extensive) Patient / Family Education for ongoing care X- 1 10 Staff obtains Programmer, systems, Records, Test Results / Process Orders []  - 0 Staff telephones HHA, Nursing Homes / Clarify orders / etc []  - 0 Routine Transfer to another Facility (non-emergent condition) []  - 0 Routine Hospital Admission (non-emergent condition) X- 1 15 New Admissions / Biomedical engineer / Ordering NPWT, Apligraf, etc. []  - 0 Emergency Hospital Admission (emergent condition) X- 1 10 Simple Discharge Coordination []  - 0 Complex (extensive) Discharge Coordination PROCESS - Special Needs []  - Pediatric / Minor Patient Management 0 []  - 0 Isolation Patient Management Guiney, Storm B. ([id number]) []  - 0 Hearing / Language / Visual special needs []  - 0 Assessment of Community assistance (transportation, D/C planning, etc.) []  - 0 Additional assistance / Altered mentation []  - 0 Support Surface(s) Assessment (bed, cushion, seat,  etc.) INTERVENTIONS - Wound Cleansing / Measurement X - Wound Imaging (photographs - any number of wounds) 1 5 []  - 0 Wound Tracing (instead of photographs) []  - 0 Simple Wound Measurement - one wound X- 3 5 Complex Wound Measurement - multiple wounds []  - 0 Simple Wound Cleansing - one wound X- 3 5 Complex Wound Cleansing -  multiple wounds INTERVENTIONS - Wound Dressings X - Small Wound Dressing one or multiple wounds 3 10 []  - 0 Medium Wound Dressing one or multiple wounds []  - 0 Large Wound Dressing one or multiple wounds []  - 0 Application of Medications - injection INTERVENTIONS - Miscellaneous []  - External ear exam 0 []  - 0 Specimen Collection (cultures, biopsies, blood, body fluids, etc.) []  - 0 Specimen(s) / Culture(s) sent or taken to Lab for analysis []  - 0 Patient Transfer (multiple staff / Harrel Lemon Lift / Similar devices) []  - 0 Simple Staple / Suture removal (25 or less) []  - 0 Complex Staple / Suture removal (26 or more) []  - 0 Hypo / Hyperglycemic Management (close monitor of Blood Glucose) X- 1 15 Ankle / Brachial Index (ABI) - do not check if billed separately Has the patient been seen at the hospital within the last three years: Yes Total Score: 190 Level Of Care: New/Established - Level 5 Electronic Signature(s) Signed: 12/08/2017 5:51:08 PM By: Montey Hora Entered By: Montey Hora on 12/08/2017 15:31:23 [name] ([id number]) -------------------------------------------------------------------------------- Encounter Discharge Information Details Patient Name: [name] Date of Service: 12/08/2017 1:00 PM Medical Record Number: [id number] Patient Account Number: [id number] Date of Birth/Sex: [birth year] (51 y.o. M) Treating RN: Cornell Barman Primary Care Sotirios Navarro: [name] Other Clinician: Referring Ayannah Faddis: [name] Treating Jullianna Gabor/Extender: Melburn Hake, HOYT Weeks in Treatment: 0 Encounter Discharge Information  Items Discharge Condition: Stable Ambulatory Status: Stretcher Discharge Destination: Home Transportation: Ambulance Schedule Follow-up Appointment: Yes Clinical Summary of Care: Electronic Signature(s) Signed: 12/08/2017 6:56:50 PM By: Gretta Cool, BSN, RN, CWS, Kim RN, BSN Entered By: Gretta Cool, BSN, RN, CWS, Kim on 12/08/2017 17:29:08 [name] ([id number]) -------------------------------------------------------------------------------- Lower Extremity Assessment Details Patient Name: [name] Date of Service: 12/08/2017 1:00 PM Medical Record Number: [id number] Patient Account Number: [id number] Date of Birth/Sex: Mar 10, 1966 (51 y.o. M) Treating RN: Roger Shelter Primary Care Toivo Bordon: [name] Other Clinician: Referring Antavia Tandy: [name] Treating Geoff Dacanay/Extender: Melburn Hake, HOYT Weeks in Treatment: 0 Vascular Assessment Pulses: Posterior Tibial Blood Pressure: Brachial: [Right:120] Dorsalis Pedis: 80 [Left:Dorsalis Pedis: 120] Ankle: Posterior Tibial: [Left:Posterior Tibial: 120 0.67] [Right:1.00] Toe Nail Assessment Left: Right: Thick: Yes Yes Discolored: Yes Yes Deformed: Yes Yes Improper Length and Hygiene: Yes Yes Electronic Signature(s) Signed: 12/08/2017 4:40:26 PM By: Roger Shelter Entered By: Roger Shelter on 12/08/2017 14:24:15 [name] ([id number]) -------------------------------------------------------------------------------- Multi Wound Chart Details Patient Name: [name] Date of Service: 12/08/2017 1:00 PM Medical Record Number: [id number] Patient Account Number: [id number] Date of Birth/Sex: Nov 05, 1965 (51 y.o. M) Treating RN: Montey Hora Primary Care Khalif Stender: [name] Other Clinician: Referring Sarika Baldini: [name] Treating Tyshun Tuckerman/Extender: Melburn Hake, HOYT Weeks in Treatment: 0 Vital Signs Height(in): 72 Pulse(bpm): 48 Weight(lbs): Blood Pressure(mmHg): 86/61 Body Mass  Index(BMI): Temperature(F): 98.3 Respiratory Rate 18 (breaths/min): Photos: Wound Location: Right Foot - Dorsal Left Foot - Dorsal Left Knee Wounding Event: Gradually Appeared Gradually Appeared Pressure Injury Primary Etiology: Pressure Ulcer Pressure Ulcer Pressure Ulcer Comorbid History: Myocardial Infarction, History Myocardial Infarction, History Myocardial Infarction, History of pressure wounds, of pressure wounds, of pressure wounds, Rheumatoid Arthritis, Rheumatoid Arthritis, Rheumatoid Arthritis, Paraplegia, Confinement Paraplegia, Confinement Paraplegia, Confinement Anxiety Anxiety Anxiety Date Acquired: 09/08/2017 09/07/2017 10/07/2017 Weeks of Treatment: 0 0 0 Wound Status: Open Open Open Clustered Wound: Yes No No Pending Amputation on Yes Yes Yes Presentation: Measurements L x W x D 7.5x2x0.2 5.5x2.5x0.8 1.5x1.2x0.1 (cm) Area (cm) : 11.781 10.799 1.414 Volume (cm) : 2.356 8.639 0.141 Classification: Category/Stage  III Category/Stage III Category/Stage III Exudate Amount: Large Medium Small Exudate Type: Sanguinous N/A Serous Exudate Color: red N/A amber Foul Odor After Cleansing: Yes Yes No Odor Anticipated Due to No No N/A Product Use: Wound Margin: Indistinct, nonvisible Indistinct, nonvisible Indistinct, nonvisible Granulation Amount: Small (1-33%) None Present (0%) Medium (34-66%) Granulation Quality: Pink N/A Pink Necrotic Amount: Large (67-100%) Large (67-100%) Medium (34-66%) Scheller, Gurtej B. ([id number]) Necrotic Tissue: Eschar Eschar Eschar Exposed Structures: Fat Layer (Subcutaneous Fat Layer (Subcutaneous Fascia: No Tissue) Exposed: Yes Tissue) Exposed: Yes Fat Layer (Subcutaneous Tissue) Exposed: No Tendon: No Muscle: No Joint: No Bone: No Epithelialization: None None None Periwound Skin Texture: No Abnormalities Noted Excoriation: No Excoriation: No Induration: No Induration: No Callus: No Callus: No Crepitus: No Crepitus:  No Rash: No Rash: No Scarring: No Scarring: No Periwound Skin Moisture: No Abnormalities Noted Dry/Scaly: Yes Maceration: No Maceration: No Dry/Scaly: No Periwound Skin Color: No Abnormalities Noted Atrophie Blanche: No Atrophie Blanche: No Cyanosis: No Cyanosis: No Ecchymosis: No Ecchymosis: No Erythema: No Erythema: No Hemosiderin Staining: No Hemosiderin Staining: No Mottled: No Mottled: No Pallor: No Pallor: No Rubor: No Rubor: No Temperature: No Abnormality No Abnormality N/A Tenderness on Palpation: No No No Wound Preparation: Ulcer Cleansing: Ulcer Cleansing: Ulcer Cleansing: Rinsed/Irrigated with Saline Rinsed/Irrigated with Saline Rinsed/Irrigated with Saline Topical Anesthetic Applied: Topical Anesthetic Applied: Topical Anesthetic Applied: Other: lidocaine 4% Other: lidocaine 4% Other: lidocaine 4% Assessment Notes: pus filled areas N/A N/A Treatment Notes Electronic Signature(s) Signed: 12/08/2017 5:51:08 PM By: Montey Hora Entered By: Montey Hora on 12/08/2017 15:06:16 [name] ([id number]) -------------------------------------------------------------------------------- Multi-Disciplinary Care Plan Details Patient Name: [name] Date of Service: 12/08/2017 1:00 PM Medical Record Number: [id number] Patient Account Number: [id number] Date of Birth/Sex: [birth year] (51 y.o. M) Treating RN: Montey Hora Primary Care Mamoudou Mulvehill: [name] Other Clinician: Referring Chrystle Murillo: [name] Treating Asia Dusenbury/Extender: Melburn Hake, HOYT Weeks in Treatment: 0 Active Inactive ` Nutrition Nursing Diagnoses: Potential for alteratiion in Nutrition/Potential for imbalanced nutrition Goals: Patient/caregiver agrees to and verbalizes understanding of need to use nutritional supplements and/or vitamins as prescribed Date Initiated: 12/08/2017 Target Resolution Date: 03/05/2018 Goal Status: Active Interventions: Assess patient nutrition  upon admission and as needed per policy Notes: ` Orientation to the Wound Care Program Nursing Diagnoses: Knowledge deficit related to the wound healing center program Goals: Patient/caregiver will verbalize understanding of the [facility] Date Initiated: 12/08/2017 Target Resolution Date: 03/05/2018 Goal Status: Active Interventions: Provide education on orientation to the wound center Notes: ` Wound/Skin Impairment Nursing Diagnoses: Impaired tissue integrity Goals: Ulcer/skin breakdown will heal within 14 weeks Date Initiated: 12/08/2017 Target Resolution Date: 03/05/2018 Goal Status: Active Interventions: SHASHANK, KWASNIK ([id number]) Assess patient/caregiver ability to obtain necessary supplies Assess patient/caregiver ability to perform ulcer/skin care regimen upon admission and as needed Assess ulceration(s) every visit Notes: Electronic Signature(s) Signed: 12/08/2017 5:51:08 PM By: Montey Hora Entered By: Montey Hora on 12/08/2017 15:03:29 [name] ([id number]) -------------------------------------------------------------------------------- Pain Assessment Details Patient Name: [name] Date of Service: 12/08/2017 1:00 PM Medical Record Number: [id number] Patient Account Number: [id number] Date of Birth/Sex: 1965-08-08 (51 y.o. M) Treating RN: Roger Shelter Primary Care Shahana Capes: [name] Other Clinician: Referring Wojciech Willetts: [name] Treating Adan Baehr/Extender: Melburn Hake, HOYT Weeks in Treatment: 0 Active Problems Location of Pain Severity and Description of Pain Patient Has Paino No Site Locations Pain Management and Medication Current Pain Management: Electronic Signature(s) Signed: 12/08/2017 4:40:26 PM By: Roger Shelter Entered By: Roger Shelter on 12/08/2017 13:46:20 Spampinato,  Noach B.  ([id number]) -------------------------------------------------------------------------------- Patient/Caregiver Education Details Patient Name: [name] Date of Service: 12/08/2017 1:00 PM Medical Record Number: [id number] Patient Account Number: [id number] Date of Birth/Gender: 1965-07-29 (52 y.o. M) Treating RN: Cornell Barman Primary Care Physician: [name] Other Clinician: Referring Physician: [name] Treating Physician/Extender: Sharalyn Ink in Treatment: 0 Education Assessment Education Provided To: Patient Education Topics Provided Wound/Skin Impairment: Handouts: Caring for Your Ulcer Methods: Demonstration, Explain/Verbal Responses: State content correctly Electronic Signature(s) Signed: 12/08/2017 6:56:50 PM By: Gretta Cool, BSN, RN, CWS, Kim RN, BSN Entered By: Gretta Cool, BSN, RN, CWS, Kim on 12/08/2017 17:29:32 [name] ([id number]) -------------------------------------------------------------------------------- Wound Assessment Details Patient Name: [name] Date of Service: 12/08/2017 1:00 PM Medical Record Number: [id number] Patient Account Number: [id number] Date of Birth/Sex: 08/27/1965 (51 y.o. M) Treating RN: Roger Shelter Primary Care Keandra Medero: [name] Other Clinician: Referring Lillan Mccreadie: [name] Treating Renly Roots/Extender: Melburn Hake, HOYT Weeks in Treatment: 0 Wound Status Wound Number: 1 Primary Pressure Ulcer Etiology: Wound Location: Right Foot - Dorsal Wound Open Wounding Event: Gradually Appeared Status: Date Acquired: 09/08/2017 Comorbid Myocardial Infarction, History of pressure Weeks Of Treatment: 0 History: wounds, Rheumatoid Arthritis, Paraplegia, Clustered Wound: Yes Confinement Anxiety Photos Photo Uploaded By: Roger Shelter on 12/08/2017 14:40:40 Wound Measurements Length: (cm) 7.5 Width: (cm) 2 Depth: (cm) 0.2 Area: (cm) 11.781 Volume: (cm) 2.356 % Reduction in Area: % Reduction in  Volume: Epithelialization: None Tunneling: No Undermining: No Wound Description Classification: Category/Stage III Wound Margin: Indistinct, nonvisible Exudate Amount: Large Exudate Type: Sanguinous Exudate Color: red Foul Odor After Cleansing: Yes Due to Product Use: No Slough/Fibrino Yes Wound Bed Granulation Amount: Small (1-33%) Exposed Structure Granulation Quality: Pink Fat Layer (Subcutaneous Tissue) Exposed: Yes Necrotic Amount: Large (67-100%) Necrotic Quality: Eschar Periwound Skin Texture Texture Color No Abnormalities Noted: No No Abnormalities Noted: No Moisture Temperature / Pain Grygiel, Kaison B. ([id number]) No Abnormalities Noted: No Temperature: No Abnormality Wound Preparation Ulcer Cleansing: Rinsed/Irrigated with Saline Topical Anesthetic Applied: Other: lidocaine 4%, Assessment Notes pus filled areas Electronic Signature(s) Signed: 12/08/2017 4:40:26 PM By: Roger Shelter Entered By: Roger Shelter on 12/08/2017 14:19:46 [name] ([id number]) -------------------------------------------------------------------------------- Wound Assessment Details Patient Name: [name] Date of Service: 12/08/2017 1:00 PM Medical Record Number: [id number] Patient Account Number: [id number] Date of Birth/Sex: 13-Feb-1966 (51 y.o. M) Treating RN: Roger Shelter Primary Care Keonia Pasko: [name] Other Clinician: Referring Andera Cranmer: [name] Treating Reichen Hutzler/Extender: Melburn Hake, HOYT Weeks in Treatment: 0 Wound Status Wound Number: 2 Primary Bacterial Osteomyelitis Etiology: Wound Location: Left Foot - Dorsal Wound Open Wounding Event: Gradually Appeared Status: Date Acquired: 09/07/2017 Comorbid Myocardial Infarction, History of pressure Weeks Of Treatment: 0 History: wounds, Rheumatoid Arthritis, Paraplegia, Clustered Wound: No Confinement Anxiety Pending Amputation On Presentation Photos Wound Measurements Length: (cm)  5.5 Width: (cm) 2.5 Depth: (cm) 0.8 Area: (cm) 10.799 Volume: (cm) 8.639 % Reduction in Area: 0% % Reduction in Volume: 0% Epithelialization: None Tunneling: No Undermining: No Wound Description Full Thickness With Exposed Support Foul Od Classification: Structures Due to Wound Margin: Indistinct, nonvisible Slough/ Exudate Medium Amount: or After Cleansing: Yes Product Use: No Fibrino Yes Wound Bed Granulation Amount: None Present (0%) Exposed Structure Necrotic Amount: Large (67-100%) Fat Layer (Subcutaneous Tissue) Exposed: Yes Necrotic Quality: Eschar Bone Exposed: Yes Periwound Skin Texture Texture Color No Abnormalities Noted: No No Abnormalities Noted: No Callus: No Atrophie Blanche: No Crepitus: No Cyanosis: No Deam, Grover B. ([id number]) Excoriation: No Ecchymosis: No Induration: No Erythema: No Rash: No Hemosiderin Staining:  No Scarring: No Mottled: No Pallor: No Moisture Rubor: No No Abnormalities Noted: No Dry / Scaly: Yes Temperature / Pain Maceration: No Temperature: No Abnormality Wound Preparation Ulcer Cleansing: Rinsed/Irrigated with Saline Topical Anesthetic Applied: Other: lidocaine 4%, Treatment Notes Wound #2 (Left, Dorsal Foot) Notes Silvercell, kerlix and netting Electronic Signature(s) Signed: 12/08/2017 5:11:47 PM By: Montey Hora Signed: 12/09/2017 12:03:29 PM By: Roger Shelter Previous Signature: 12/08/2017 4:40:26 PM Version By: Roger Shelter Entered By: Montey Hora on 12/08/2017 17:11:46 [name] ([id number]) -------------------------------------------------------------------------------- Wound Assessment Details Patient Name: [name] Date of Service: 12/08/2017 1:00 PM Medical Record Number: [id number] Patient Account Number: [id number] Date of Birth/Sex: 04-27-1965 (51 y.o. M) Treating RN: Roger Shelter Primary Care Keslie Gritz: [name] Other Clinician: Referring  Jakorian Marengo: [name] Treating Shelle Galdamez/Extender: Melburn Hake, HOYT Weeks in Treatment: 0 Wound Status Wound Number: 3 Primary Pressure Ulcer Etiology: Wound Location: Left Knee Wound Open Wounding Event: Pressure Injury Status: Date Acquired: 10/07/2017 Comorbid Myocardial Infarction, History of pressure Weeks Of Treatment: 0 History: wounds, Rheumatoid Arthritis, Paraplegia, Clustered Wound: No Confinement Anxiety Photos Photo Uploaded By: Roger Shelter on 12/08/2017 14:40:51 Wound Measurements Length: (cm) 1.5 Width: (cm) 1.2 Depth: (cm) 0.1 Area: (cm) 1.414 Volume: (cm) 0.141 % Reduction in Area: % Reduction in Volume: Epithelialization: None Tunneling: No Undermining: No Wound Description Classification: Category/Stage III Wound Margin: Indistinct, nonvisible Exudate Amount: Small Exudate Type: Serous Exudate Color: amber Foul Odor After Cleansing: No Slough/Fibrino Yes Wound Bed Granulation Amount: Medium (34-66%) Exposed Structure Granulation Quality: Pink Fascia Exposed: No Necrotic Amount: Medium (34-66%) Fat Layer (Subcutaneous Tissue) Exposed: No Necrotic Quality: Eschar Tendon Exposed: No Muscle Exposed: No Joint Exposed: No Bone Exposed: No Periwound Skin Texture Bezold, Devan B. ([id number]) Texture Color No Abnormalities Noted: No No Abnormalities Noted: No Callus: No Atrophie Blanche: No Crepitus: No Cyanosis: No Excoriation: No Ecchymosis: No Induration: No Erythema: No Rash: No Hemosiderin Staining: No Scarring: No Mottled: No Pallor: No Moisture Rubor: No No Abnormalities Noted: No Dry / Scaly: No Maceration: No Wound Preparation Ulcer Cleansing: Rinsed/Irrigated with Saline Topical Anesthetic Applied: Other: lidocaine 4%, Electronic Signature(s) Signed: 12/08/2017 4:40:26 PM By: Roger Shelter Entered By: Roger Shelter on 12/08/2017 14:23:08 [name]  ([id number]) -------------------------------------------------------------------------------- Vitals Details Patient Name: [name] Date of Service: 12/08/2017 1:00 PM Medical Record Number: [id number] Patient Account Number: [id number] Date of Birth/Sex: April 21, 1965 (51 y.o. M) Treating RN: Roger Shelter Primary Care Gwin Eagon: [name] Other Clinician: Referring Karman Biswell: [name] Treating Cattie Tineo/Extender: Melburn Hake, HOYT Weeks in Treatment: 0 Vital Signs Time Taken: 13:48 Temperature (F): 98.3 Height (in): 72 Pulse (bpm): 82 Source: Stated Respiratory Rate (breaths/min): 18 Blood Pressure (mmHg): 86/61 Reference Range: 80 - 120 mg / dl Electronic Signature(s) Signed: 12/08/2017 4:40:26 PM By: Roger Shelter Entered By: Roger Shelter on 12/08/2017 13:49:11

## 2017-12-10 NOTE — Progress Notes (Signed)
JERMAL, DISMUKE ([id number]) Visit Report for 12/08/2017 Chief Complaint Document Details Patient Name: [name], [name] Date of Service: 12/08/2017 1:00 PM Medical Record Number: [id number] Patient Account Number: [id number] Date of Birth/Sex: Feb 20, 1966 (52 y.o. M) Treating RN: Montey Hora Primary Care Provider: [name] Other Clinician: Referring Provider: [name] Treating Provider/Extender: Melburn Hake, HOYT Weeks in Treatment: 0 Information Obtained from: Patient Chief Complaint Bilateral Foot Ulcers Electronic Signature(s) Signed: 12/09/2017 11:11:19 AM By: Worthy Keeler PA-C Entered By: Worthy Keeler on 12/08/2017 14:22:34 [name] ([id number]) -------------------------------------------------------------------------------- HPI Details Patient Name: [name] Date of Service: 12/08/2017 1:00 PM Medical Record Number: [id number] Patient Account Number: [id number] Date of Birth/Sex: Jan 01, 1966 (51 y.o. M) Treating RN: Montey Hora Primary Care Provider: [name] Other Clinician: Referring Provider: [name] Treating Provider/Extender: Melburn Hake, HOYT Weeks in Treatment: 0 History of Present Illness Associated Signs and Symptoms: Patient has a history of confirmed osteomyelitis of the left foot according to MRI July 2019. 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 HPI Description: 12/08/17 on evaluation today patient presents for initial evaluation and our clinic concerning issues he has been having with his feet bilaterally although the left more than right most recently. He was admitted to hospital where he was placed on IV vancomycin which actually he continue to utilize until discharge and even when discharged continue to be on until around 16 August according to what he tells me. His PICC line was removed at that point. Nonetheless he has been seen in the wound care  center in [facility] before transferring to [place] due to the fact that the doctor there left and they are no longer open. Nonetheless he does have significant osteomyelitis of the left foot noted on MRI which revealed significant issues with necrotic bone including a portion of the distal region of his left great toe which was felt to possibly either be missing as a result of amputation or potentially resorption. Either way he also had osteomyelitis noted of the digit, metatarsal region, cuneiform, and navicular bones. There is definite bone exposure noted externally at this point in time. In regard to the right foot he has issues with ulcerations here as well although he states that recently he's had no issues until this just blistered and reopened. Apparently Xeroform was used in this has caused things to be somewhat more moist and open more according to the patient. He's otherwise been tolerating the dressing changes without complication using silver alginate dressings. He has no discomfort but does seem to be extremely stressed regarding the fact that he did see Dr. 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 The patient stated per notes reviewed that he did not want to have any surgery and therefore has a repeat appointment scheduled with the surgeon on 12/15/17. With all that being said he feels like that he really has not been alerted to what was going on in the severity that was until just recently and has a lot of questions in that regard. I'll be see I explained that seeing him for the first time today I cannot answer a lot of those questions that he has. Electronic Signature(s) Signed: 12/09/2017 11:11:19 AM By: Worthy Keeler PA-C Entered By: Worthy Keeler on 12/08/2017 18:14:17 [name] ([id number]) -------------------------------------------------------------------------------- Physical Exam Details Patient Name: [name] Date of Service: 12/08/2017 1:00 PM Medical Record Number:  [id number] Patient Account Number: [id number] Date of Birth/Sex: 04/27/1965 (52 y.o. M) Treating RN: Montey Hora Primary Care Provider: [name] Other Clinician: Referring Provider: [name] Treating Provider/Extender: Melburn Hake, HOYT Weeks in Treatment: 0 Constitutional patient is hypotensive.. pulse regular and within target range for patient.Marland Kitchen respirations regular, non-labored and within target range for patient.. Thin and well-hydrated in no acute distress. Eyes conjunctiva clear no eyelid edema noted. pupils equal round and reactive to light and accommodation. Ears, Nose, Mouth, and Throat no gross abnormality of ear auricles or external auditory canals. normal hearing noted during conversation. mucus membranes moist. Respiratory normal breathing without difficulty. clear to auscultation bilaterally. Cardiovascular regular rate and rhythm with normal S1, S2. Absent posterior tibial and dorsalis pedis pulses bilateral lower extremities. trace pitting edema of the bilateral lower extremities. Gastrointestinal (GI) soft, non-tender, non-distended, +BS. no ventral hernia noted. Musculoskeletal Patient unable to walk. Psychiatric this patient is able to make decisions and demonstrates good insight into disease process. Alert and Oriented x 3. pleasant and cooperative. Notes Patient's wound on the left foot actually did show a bone exposure which was necrotic at this point in time. Just with me lightly pushing on the region there was actually puss emanating from around the bone in particular this is despite him having just come off of what sounds to be 4-6 weeks of IV antibiotic therapy with vancomycin. Nonetheless this is obviously not a good sign I did explain this to the patient. In regard to the right foot there appear to be some areas where I'm not sure that there was not bone noted exposed here as well. He's not  had any MRI of this region although I think that that maybe something for [place] to consider. Electronic Signature(s) Signed: 12/09/2017 11:11:19 AM By: Worthy Keeler PA-C Entered By: Worthy Keeler on 12/08/2017 18:16:00 [name] ([id number]) -------------------------------------------------------------------------------- Physician Orders Details Patient Name: [name] Date of Service: 12/08/2017 1:00 PM Medical Record Number: [id number] Patient Account Number: [id number] Date of Birth/Sex: 1965-06-11 (51 y.o. 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 Wound Cleansing Wound #1 Right,Dorsal Foot o Cleanse wound with mild soap and water Wound #2 Left,Dorsal Foot o Cleanse wound with mild soap and water Wound #3 Left Knee o Cleanse wound with mild soap and water Primary Wound Dressing Wound #1 Right,Dorsal Foot o Silver Alginate Wound #2 Left,Dorsal Foot o Silver Alginate Wound #3 Left Knee o Silver Alginate Secondary Dressing Wound #1 Right,Dorsal Foot o Gauze, ABD and Kerlix/Conform Wound #2 Left,Dorsal Foot o Gauze, ABD and Kerlix/Conform Wound #3 Left Knee o Gauze, ABD and Kerlix/Conform Dressing Change Frequency Wound #1 Right,Dorsal Foot Dezeeuw, Lionardo B. ([id number]) o Change Dressing Monday, Wednesday, Friday Wound #2 Left,Dorsal Foot o Change Dressing Monday,  Wednesday, Friday Wound #3 Left Knee o Change Dressing Monday, Wednesday, Friday Follow-up Appointments Wound #1 Right,Dorsal Foot o Return Appointment in 1 week. Wound #2 Left,Dorsal Foot o Return Appointment in 1 week. Wound #3 Left Knee o Return Appointment in 1 week. Additional Orders /  Instructions Wound #1 Right,Dorsal Foot o Vitamin A; Vitamin C, Zinc - Please add a multivitamin daily that has 100% of vitamin A, vitamin C and Zinc supplements o Increase protein intake. Wound #2 Left,Dorsal Foot o Vitamin A; Vitamin C, Zinc - Please add a multivitamin daily that has 100% of vitamin A, vitamin C and Zinc supplements o Increase protein intake. Wound #3 Left Knee o Vitamin A; Vitamin C, Zinc - Please add a multivitamin daily that has 100% of vitamin A, vitamin C and Zinc supplements o Increase protein intake. Home Health Wound #1 [place] Visits o Home Health Nurse may visit PRN to address patientos wound care needs. o FACE TO FACE ENCOUNTER: MEDICARE and MEDICAID PATIENTS: I certify that this patient is under my care and that I had a face-to-face encounter that meets the physician face-to-face encounter requirements with this patient on this date. The encounter with the patient was in whole or in part for the following MEDICAL CONDITION: (primary reason for [place]) MEDICAL NECESSITY: I certify, that based on my findings, NURSING services are a medically necessary home health service. HOME BOUND STATUS: I certify that my clinical findings support that this patient is homebound (i.e., Due to illness or injury, pt requires aid of supportive devices such as crutches, cane, wheelchairs, walkers, the use of special transportation or the assistance of another person to leave their place of residence. There is a normal inability to leave the home and doing so requires considerable and taxing effort. Other absences are  for medical reasons / religious services and are infrequent or of short duration when for other reasons). o If current dressing causes regression in wound condition, may D/C ordered dressing product/s and apply Normal Saline Moist Dressing daily until next [place] / Other MD appointment. [place] of regression in wound condition at [phone]. o Please direct any NON-WOUND related issues/requests for orders to patient's Primary Care Physician Wound #2 [place] Visits o Home Health Nurse may visit PRN to address patientos wound care needs. [name], [name] ([id number]) o FACE TO FACE ENCOUNTER: MEDICARE and MEDICAID PATIENTS: I certify that this patient is under my care and that I had a face-to-face encounter that meets the physician face-to-face encounter requirements with this patient on this date. The encounter with the patient was in whole or in part for the following MEDICAL CONDITION: (primary reason for [place]) MEDICAL NECESSITY: I certify, that based on my findings, NURSING services are a medically necessary home health service. HOME BOUND STATUS: I certify that my clinical findings support that this patient is homebound (i.e., Due to illness or injury, pt requires aid of supportive devices such as crutches, cane, wheelchairs, walkers, the use of special transportation or the assistance of another person to leave their place of residence. There is a normal inability to leave the home and doing so requires considerable and taxing effort. Other absences are for medical reasons / religious services and are infrequent or of short duration when for other reasons). o If current dressing causes regression in wound condition, may D/C ordered dressing product/s and apply Normal Saline Moist Dressing daily until next [place] / Other MD appointment. [place] of regression in  wound condition at [phone]. o Please direct any NON-WOUND related issues/requests for orders to patient's Primary Care Physician Wound #3 Left Knee o [place] Nurse may visit PRN  to address patientos wound care needs. o FACE TO FACE ENCOUNTER: MEDICARE and MEDICAID PATIENTS: I certify that this patient is under my care and that I had a face-to-face encounter that meets the physician face-to-face encounter requirements with this patient on this date. The encounter with the patient was in whole or in part for the following MEDICAL CONDITION: (primary reason for [place]) MEDICAL NECESSITY: I certify, that based on my findings, NURSING services are a medically necessary home health service. HOME BOUND STATUS: I certify that my clinical findings support that this patient is homebound (i.e., Due to illness or injury, pt requires aid of supportive devices such as crutches, cane, wheelchairs, walkers, the use of special transportation or the assistance of another person to leave their place of residence. There is a normal inability to leave the home and doing so requires considerable and taxing effort. Other absences are for medical reasons / religious services and are infrequent or of short duration when for other reasons). o If current dressing causes regression in wound condition, may D/C ordered dressing product/s and apply Normal Saline Moist Dressing daily until next [place] / Other MD appointment. [place] of regression in wound condition at [phone]. o Please direct any NON-WOUND related issues/requests for orders to patient's Primary Care Physician Radiology o Magnetic Resonance Imaging (MRI) - right foot with and without contrast Electronic Signature(s) Signed: 12/08/2017 5:51:08 PM By: Montey Hora Signed: 12/09/2017 11:11:19 AM By: Worthy Keeler PA-C Entered By: Montey Hora on 12/08/2017  15:30:32 [name] ([id number]) -------------------------------------------------------------------------------- Problem List Details Patient Name: [name] Date of Service: 12/08/2017 1:00 PM Medical Record Number: [id number] Patient Account Number: [id number] Date of Birth/Sex: [birth year] (51 y.o. M) Treating RN: Montey Hora Primary Care Provider: [name] Other Clinician: Referring Provider: [name] Treating Provider/Extender: Melburn Hake, HOYT Weeks in Treatment: 0 Active Problems ICD-10 Evaluated Encounter Code Description Active Date Today Diagnosis [id number] Other acute osteomyelitis, left ankle and foot 12/08/2017 No Yes L97.524 Non-pressure chronic ulcer of other part of left foot with 12/08/2017 No Yes necrosis of bone L97.512 Non-pressure chronic ulcer of other part of right foot with fat 12/08/2017 No Yes layer exposed G82.54 Quadriplegia, C5-C7 incomplete 12/08/2017 No Yes R63.6 Underweight 12/08/2017 No Yes E44.0 Moderate protein-calorie malnutrition 12/08/2017 No Yes 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 M) Treating RN: Montey Hora Primary Care Provider: [name] Other Clinician: Referring Provider: [name] Treating Provider/Extender: Melburn Hake, HOYT Weeks in Treatment: 0 Subjective Chief Complaint Information obtained from Patient Bilateral Foot Ulcers History of Present Illness (HPI) The following HPI elements were documented for the patient's  wound: Associated Signs and Symptoms: Patient has a history of confirmed osteomyelitis of the left foot according to MRI July 2019. 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 12/08/17 on evaluation today patient presents for initial evaluation and our clinic concerning issues he has been having with his feet bilaterally although the left more than right most recently. He was admitted to hospital where he was placed on IV vancomycin which actually he continue to utilize until discharge and even when  discharged continue to be on until around 16 August according to what he tells me. His PICC line was removed at that point. Nonetheless he has been seen in the wound care center in [facility] before transferring to [place] due to the fact that the doctor there left and they are no longer open. Nonetheless he does have significant osteomyelitis of the left foot noted on MRI which revealed significant issues with necrotic bone including a portion of the distal region of his left great toe which was felt to possibly either be missing as a result of amputation or potentially resorption. Either way he also had osteomyelitis noted of the digit, metatarsal region, cuneiform, and navicular bones. There is definite bone exposure noted externally at this point in time. In regard to the right foot he has issues with ulcerations here as well although he states that recently he's had no issues until this just blistered and reopened. Apparently Xeroform was used in this has caused things to be somewhat more moist and open more according to the patient. He's otherwise been tolerating the dressing changes without complication using silver alginate dressings. He has no discomfort but does seem to be extremely stressed regarding the fact that he did see Dr. 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 The patient stated per  notes reviewed that he did not want to have any surgery and therefore has a repeat appointment scheduled with the surgeon on 12/15/17. With all that being said he feels like that he really has not been alerted to what was going on in the severity that was until just recently and has a lot of questions in that regard. I'll be see I explained that seeing him for the first time today I cannot answer a lot of those questions that he has. Wound History Patient presents with 3 open wounds. Laboratory tests have been performed in the last month. Patient reportedly has tested positive for an antibiotic resistant organism. Patient History Allergies niacin, shrimp Family History Cancer - Siblings,Mother, Diabetes - Siblings, Heart Disease - Father, Hypertension - Father, Kidney Disease - Siblings, No family history of Lung Disease, Seizures, Stroke, Thyroid Problems, Tuberculosis. Social History Never smoker, Marital Status - Separated, Alcohol Use - Never, Drug Use - No History, Caffeine Use - Moderate. Medical History Eyes [name], [name]. ([id number]) Denies history of Cataracts, Glaucoma, Optic Neuritis Ear/Nose/Mouth/Throat Denies history of Chronic sinus problems/congestion, Middle ear problems Hematologic/Lymphatic Denies history of Anemia, Human Immunodeficiency Virus, Lymphedema, Sickle Cell Disease Respiratory Denies history of Aspiration, Asthma, Chronic Obstructive Pulmonary Disease (COPD), Pneumothorax, Sleep Apnea, Tuberculosis Cardiovascular Patient has history of Myocardial Infarction Denies history of Angina, Arrhythmia, Congestive Heart Failure, Coronary Artery Disease, Deep Vein Thrombosis, Hypertension, Hypotension, Peripheral Arterial Disease, Peripheral Venous Disease, Phlebitis, Vasculitis Gastrointestinal Denies history of Cirrhosis , Colitis, Crohn s, Hepatitis A, Hepatitis B, Hepatitis C Endocrine Denies history of Type I Diabetes, Type II  Diabetes Genitourinary Denies history of End Stage Renal Disease Immunological Denies history of Lupus Erythematosus, Raynaud s, Scleroderma Integumentary (Skin) Patient has history of History of pressure wounds Denies history of History of Burn Musculoskeletal Patient has history of Rheumatoid Arthritis Denies history of Osteoarthritis, Osteomyelitis Neurologic Patient has history of Paraplegia - neck down paralysis Denies history of Dementia, Neuropathy, Quadriplegia, Seizure Disorder Psychiatric Patient has history of Confinement Anxiety Denies history of Anorexia/bulimia Review of Systems (ROS) Constitutional Symptoms (General Health) Denies complaints or symptoms of Fatigue, Fever, Chills, Marked Weight Change. Eyes  Denies complaints or symptoms of Dry Eyes, Vision Changes, Glasses / Contacts. Ear/Nose/Mouth/Throat Zyrtec or claritan daily Hematologic/Lymphatic Denies complaints or symptoms of Bleeding / Clotting Disorders, Human Immunodeficiency Virus. Respiratory Denies complaints or symptoms of Chronic or frequent coughs, Shortness of Breath. Cardiovascular Denies complaints or symptoms of Chest pain, LE edema. Gastrointestinal Denies complaints or symptoms of Frequent diarrhea, Nausea, Vomiting, constipation isssues Endocrine Denies complaints or symptoms of Hepatitis, Thyroid disease, Polydypsia (Excessive Thirst). Genitourinary Denies complaints or symptoms of Kidney failure/ Dialysis, Incontinence/dribbling, in dwelling foley catheter Immunological Denies complaints or symptoms of Hives. Integumentary (Skin) Complains or has symptoms of Wounds. Denies complaints or symptoms of Breakdown, Swelling. Musculoskeletal Denies complaints or symptoms of Muscle Pain, Muscle Weakness. Neurologic [name], Colin B. ([id number]) Complains or has symptoms of Numbness/parasthesias - hands numb. Oncologic The patient has no complaints or  symptoms. Psychiatric Complains or has symptoms of Anxiety, Claustrophobia. Objective Constitutional patient is hypotensive.. pulse regular and within target range for patient.Marland Kitchen respirations regular, non-labored and within target range for patient.. Thin and well-hydrated in no acute distress. Vitals Time Taken: 1:48 PM, Height: 72 in, Source: Stated, Temperature: 98.3 F, Pulse: 82 bpm, Respiratory Rate: 18 breaths/min, Blood Pressure: 86/61 mmHg. Eyes conjunctiva clear no eyelid edema noted. pupils equal round and reactive to light and accommodation. Ears, Nose, Mouth, and Throat no gross abnormality of ear auricles or external auditory canals. normal hearing noted during conversation. mucus membranes moist. Respiratory normal breathing without difficulty. clear to auscultation bilaterally. Cardiovascular regular rate and rhythm with normal S1, S2. Absent posterior tibial and dorsalis pedis pulses bilateral lower extremities. trace pitting edema of the bilateral lower extremities. Gastrointestinal (GI) soft, non-tender, non-distended, +BS. no ventral hernia noted. Musculoskeletal Patient unable to walk. Psychiatric this patient is able to make decisions and demonstrates good insight into disease process. Alert and Oriented x 3. pleasant and cooperative. General Notes: Patient's wound on the left foot actually did show a bone exposure which was necrotic at this point in time. Just with me lightly pushing on the region there was actually puss emanating from around the bone in particular this is despite him having just come off of what sounds to be 4-6 weeks of IV antibiotic therapy with vancomycin. Nonetheless this is obviously not a good sign I did explain this to the patient. In regard to the right foot there appear to be some areas where I'm not sure that there was not bone noted exposed here as well. He's not had any MRI of this region although I think that that maybe something  for [place] to consider. Integumentary (Hair, Skin) Wound #1 status is Open. Original cause of wound was Gradually Appeared. The wound is located on the Right,Dorsal Foot. The wound measures 7.5cm length x 2cm width x 0.2cm depth; 11.781cm^2 area and 2.356cm^3 volume. There is Fat Layer [place], Sabir B. ([id number]) (Subcutaneous Tissue) Exposed exposed. There is no tunneling or undermining noted. There is a large amount of sanguinous drainage noted. Foul odor after cleansing was noted. The wound margin is indistinct and nonvisible. There is small (1-33%) pink granulation within the wound bed. There is a large (67-100%) amount of necrotic tissue within the wound bed including Eschar. Periwound temperature was noted as No Abnormality. General Notes: pus filled areas Wound #2 status is Open. Original cause of wound was Gradually Appeared. The wound is located on the Left,Dorsal Foot. The wound measures 5.5cm length x 2.5cm width x 0.8cm depth; 10.799cm^2 area and 8.639cm^3 volume. There is bone and  Fat Layer (Subcutaneous Tissue) Exposed exposed. There is no tunneling or undermining noted. There is a medium amount of drainage noted. Foul odor after cleansing was noted. The wound margin is indistinct and nonvisible. There is no granulation within the wound bed. There is a large (67-100%) amount of necrotic tissue within the wound bed including Eschar. The periwound skin appearance exhibited: Dry/Scaly. The periwound skin appearance did not exhibit: Callus, Crepitus, Excoriation, Induration, Rash, Scarring, Maceration, Atrophie Blanche, Cyanosis, Ecchymosis, Hemosiderin Staining, Mottled, Pallor, Rubor, Erythema. Periwound temperature was noted as No Abnormality. Wound #3 status is Open. Original cause of wound was Pressure Injury. The wound is located on the Left Knee. The wound measures 1.5cm length x 1.2cm width x 0.1cm depth; 1.414cm^2 area and 0.141cm^3 volume. There is no tunneling  or undermining noted. There is a small amount of serous drainage noted. The wound margin is indistinct and nonvisible. There is medium (34-66%) pink granulation within the wound bed. There is a medium (34-66%) amount of necrotic tissue within the wound bed including Eschar. The periwound skin appearance did not exhibit: Callus, Crepitus, Excoriation, Induration, Rash, Scarring, Dry/Scaly, Maceration, Atrophie Blanche, Cyanosis, Ecchymosis, Hemosiderin Staining, Mottled, Pallor, Rubor, Erythema. 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 ([id number]) Wound #3 Left Knee: Silver Alginate Secondary Dressing: Wound #1 Right,Dorsal Foot: Gauze, ABD and Kerlix/Conform Wound #2 Left,Dorsal Foot: Gauze, ABD and Kerlix/Conform Wound #3 Left Knee: Gauze, ABD and Kerlix/Conform Dressing Change Frequency: Wound #1 Right,Dorsal Foot: Change Dressing Monday, Wednesday, Friday Wound #2 Left,Dorsal Foot: Change Dressing Monday, Wednesday, Friday Wound #3 Left Knee: Change Dressing Monday, Wednesday, Friday Follow-up Appointments: Wound #1 Right,Dorsal Foot: Return Appointment in 1 week. Wound #2 Left,Dorsal Foot: Return Appointment in 1 week. Wound #3 Left Knee: Return Appointment in 1 week. Additional Orders / Instructions: Wound #1 Right,Dorsal  Foot: Vitamin A; Vitamin C, Zinc - Please add a multivitamin daily that has 100% of vitamin A, vitamin C and Zinc supplements Increase protein intake. Wound #2 Left,Dorsal Foot: Vitamin A; Vitamin C, Zinc - Please add a multivitamin daily that has 100% of vitamin A, vitamin C and Zinc supplements Increase protein intake. Wound #3 Left Knee: Vitamin A; Vitamin C, Zinc - Please add a multivitamin daily that has 100% of vitamin A, vitamin C and Zinc supplements Increase protein intake. Home Health: Wound #1 Right,Dorsal Foot: [place] Nurse may visit PRN to address patient s wound care needs. FACE TO FACE ENCOUNTER: MEDICARE and MEDICAID PATIENTS: I certify that this patient is under my care and that I had a face-to-face encounter that meets the physician face-to-face encounter requirements with this patient on this date. The encounter with the patient was in whole or in part for the following MEDICAL CONDITION: (primary reason for [place]) MEDICAL NECESSITY: I certify, that based on my findings, NURSING services are a medically necessary home health service. HOME BOUND STATUS: I certify that my clinical findings support that this patient  is homebound (i.e., Due to illness or injury, pt requires aid of supportive devices such as crutches, cane, wheelchairs, walkers, the use of special transportation or the assistance of another person to leave their place of residence. There is a normal inability to leave the home and doing so requires considerable and taxing effort. Other absences are for medical reasons / religious services and are infrequent or of short duration when for other reasons). If current dressing causes regression in wound condition, may D/C ordered dressing product/s and apply Normal Saline Moist Dressing daily until next [place] / Other MD appointment. [place] of regression in wound condition at  [phone]. Please direct any NON-WOUND related issues/requests for orders to patient's Primary Care Physician Wound #2 Left,Dorsal Foot: [place] Nurse may visit PRN to address patient s wound care needs. FACE TO FACE ENCOUNTER: MEDICARE and MEDICAID PATIENTS: I certify that this patient is under my care and that I had a face-to-face encounter that meets the physician face-to-face encounter requirements with this patient on this date. The encounter with the patient was in whole or in part for the following MEDICAL CONDITION: (primary reason for [place]) MEDICAL NECESSITY: I certify, that based on my findings, NURSING services are a medically necessary home health service. HOME BOUND STATUS: I certify that my clinical findings support that this patient is homebound (i.e., Due to illness or injury, pt requires aid of supportive devices such as crutches, cane, wheelchairs, walkers, the use of special transportation or the assistance of another person to leave their place of residence. There is a normal inability to leave the [facility], Tregan B. ([id number]) home and doing so requires considerable and taxing effort. Other absences are for medical reasons / religious services and are infrequent or of short duration when for other reasons). If current dressing causes regression in wound condition, may D/C ordered dressing product/s and apply Normal Saline Moist Dressing daily until next [place] / Other MD appointment. [place] of regression in wound condition at [phone]. Please direct any NON-WOUND related issues/requests for orders to patient's Primary Care Physician Wound #3 Left Knee: [place] Nurse may visit PRN to address patient s wound care needs. FACE TO FACE ENCOUNTER: MEDICARE and MEDICAID PATIENTS: I certify that this patient is under my care and that I had a face-to-face  encounter that meets the physician face-to-face encounter requirements with this patient on this date. The encounter with the patient was in whole or in part for the following MEDICAL CONDITION: (primary reason for [place]) MEDICAL NECESSITY: I certify, that based on my findings, NURSING services are a medically necessary home health service. HOME BOUND STATUS: I certify that my clinical findings support that this patient is homebound (i.e., Due to illness or injury, pt requires aid of supportive devices such as crutches, cane, wheelchairs, walkers, the use of special transportation or the assistance of another person to leave their place of residence. There is a normal inability to leave the home and doing so requires considerable and taxing effort. Other absences are for medical reasons / religious services and are infrequent or of short duration when for other reasons). If current dressing causes regression in wound condition, may D/C ordered dressing product/s and apply Normal Saline Moist Dressing daily until next [place] / Other MD appointment. [place] of regression in wound condition at [phone]. Please direct any NON-WOUND related issues/requests  for orders to patient's Primary Care Physician Radiology ordered were: Magnetic Resonance Imaging (MRI) - right foot with and without contrast At this point I had a rather extensive conversation with the patient concerning the status of his condition in general. I explained that based on what I'm seeing and the extent of the osteomyelitis noted in the left foot that his only option to really get this to heal would likely be by way of invitation. He seems fairly adamantly opposed to this and that he feels like "once they start cutting they will just keep going". I explained that I completely understand his concerns and worries in this regard. Nonetheless my bigger concern is also that if they don't  perform any type of invitation that he does have this infection which is underlined in the MRI that is going to continue to linger and potential he could wind up with him becoming septic. I'll be see that can even be deadly. Therefore I have to give him all of his options as well is explain the situation as I see it and as it stands at this point. The patient states he understood he still seemed extremely frustrated. In fact he asked me if you the questions even several times I had to reiterate my recommendations in that regard. Nonetheless in the end I explained that we could definitely do them ride the right foot since that has not apparently been done at this point. Nonetheless if it shows extensive osteomyelitis at that location as well then the only option may be for him to proceed with bilateral amputations. I explained that he could see another surgeon in order to discuss options from that standpoint obviously I'm not a Psychologist, sport and exercise. Nonetheless we're gonna first do them ride see were things stand in the make our determinations from there. He really was not happy with the situation as it stands but nonetheless agree to that. We will see him back in two weeks. Patient was advised that if you developed any fevers, chills, nausea, vomiting, diarrhea, altered mental status, or confusion that he needs to go to the hospital and this information should be relayed to his family as well. Electronic Signature(s) Signed: 12/09/2017 11:11:19 AM By: Worthy Keeler PA-C Entered By: Worthy Keeler on 12/08/2017 18:18:39 [name] ([id number]) -------------------------------------------------------------------------------- ROS/PFSH Details Patient Name: [name] Date of Service: 12/08/2017 1:00 PM Medical Record Number: [id number] Patient Account Number: [id number] Date of Birth/Sex: 06/29/1965 (51 y.o. M) Treating RN: Roger Shelter Primary Care Provider: [name] Other  Clinician: Referring Provider: [name] Treating Provider/Extender: Melburn Hake, HOYT Weeks in Treatment: 0 Wound History Do you currently have one or more open woundso Yes How many open wounds do you currently haveo 3 Constitutional Symptoms (General Health) Complaints and Symptoms: Negative for: Fatigue; Fever; Chills; Marked Weight Change Eyes Complaints and Symptoms: Negative for: Dry Eyes; Vision Changes; Glasses / Contacts Medical History: Negative for: Cataracts; Glaucoma; Optic Neuritis Hematologic/Lymphatic Complaints and Symptoms: Negative for: Bleeding / Clotting Disorders; Human Immunodeficiency Virus Medical History: Negative for: Anemia; Human Immunodeficiency Virus; Lymphedema; Sickle Cell Disease Respiratory Complaints and Symptoms: Negative for: Chronic or frequent coughs; Shortness of Breath Medical History: Negative for: Aspiration; Asthma; Chronic Obstructive Pulmonary Disease (COPD); Pneumothorax; Sleep Apnea; Tuberculosis Cardiovascular Complaints and Symptoms: Negative for: Chest pain; LE edema Medical History: Positive for: Myocardial Infarction Negative for: Angina; Arrhythmia; Congestive Heart Failure; Coronary Artery Disease; Deep Vein Thrombosis; Hypertension; Hypotension; Peripheral Arterial Disease; Peripheral Venous Disease; Phlebitis; Vasculitis Gastrointestinal Complaints  and Symptoms: Negative for: Frequent diarrhea; Nausea; Vomiting Review of System Notes: constipation isssues [name], Colin B. ([id number]) Medical History: Negative for: Cirrhosis ; Colitis; Crohnos; Hepatitis A; Hepatitis B; Hepatitis C Endocrine Complaints and Symptoms: Negative for: Hepatitis; Thyroid disease; Polydypsia (Excessive Thirst) Medical History: Negative for: Type I Diabetes; Type II Diabetes Genitourinary Complaints and Symptoms: Negative for: Kidney failure/ Dialysis; Incontinence/dribbling Review of System Notes: in dwelling foley  catheter Medical History: Negative for: End Stage Renal Disease Immunological Complaints and Symptoms: Negative for: Hives Medical History: Negative for: Lupus Erythematosus; Raynaudos; Scleroderma Integumentary (Skin) Complaints and Symptoms: Positive for: Wounds Negative for: Breakdown; Swelling Medical History: Positive for: History of pressure wounds Negative for: History of Burn Musculoskeletal Complaints and Symptoms: Negative for: Muscle Pain; Muscle Weakness Medical History: Positive for: Rheumatoid Arthritis Negative for: Osteoarthritis; Osteomyelitis Neurologic Complaints and Symptoms: Positive for: Numbness/parasthesias - hands numb Medical History: Positive for: Paraplegia - neck down paralysis Negative for: Dementia; Neuropathy; Quadriplegia; Seizure Disorder Psychiatric [name], [name]. ([id number]) Complaints and Symptoms: Positive for: Anxiety; Claustrophobia Medical History: Positive for: Confinement Anxiety Negative for: Anorexia/bulimia Ear/Nose/Mouth/Throat Complaints and Symptoms: Review of System Notes: Zyrtec or claritan daily Medical History: Negative for: Chronic sinus problems/congestion; Middle ear problems Oncologic Complaints and Symptoms: No Complaints or Symptoms Immunizations Pneumococcal Vaccine: Received Pneumococcal Vaccination: Yes Implantable Devices Family and Social History Cancer: Yes - Siblings,Mother; Diabetes: Yes - Siblings; Heart Disease: Yes - Father; Hypertension: Yes - Father; Kidney Disease: Yes - Siblings; Lung Disease: No; Seizures: No; Stroke: No; Thyroid Problems: No; Tuberculosis: No; Never smoker; Marital Status - Separated; Alcohol Use: Never; Drug Use: No History; Caffeine Use: Moderate; Financial Concerns: No; Food, Clothing or Shelter Needs: No; Support System Lacking: No; Transportation Concerns: No Electronic Signature(s) Signed: 12/08/2017 4:40:26 PM By: Roger Shelter Signed: 12/09/2017 11:11:19 AM  By: Worthy Keeler PA-C Entered By: Roger Shelter on 12/08/2017 14:04:22 [name] ([id number]) -------------------------------------------------------------------------------- SuperBill Details Patient Name: [name] Date of Service: 12/08/2017 Medical Record Number: [id number] Patient Account Number: [id number] Date of Birth/Sex: Nov 20, 1965 (51 y.o. M) Treating RN: Montey Hora Primary Care Provider: [name] Other Clinician: Referring Provider: [name] Treating Provider/Extender: Melburn Hake, HOYT Weeks in Treatment: 0 Diagnosis Coding ICD-10 Codes Code Description [phone] Other acute osteomyelitis, left ankle and foot L97.524 Non-pressure chronic ulcer of other part of left foot with necrosis of bone L97.512 Non-pressure chronic ulcer of other part of right foot with fat layer exposed G82.54 Quadriplegia, C5-C7 incomplete R63.6 Underweight E44.0 Moderate protein-calorie malnutrition Z93.1 Gastrostomy status Facility Procedures CPT4 Code: 46568127 Description: 51700 - WOUND CARE VISIT-LEV 5 EST PT Modifier: Quantity: 1 Physician Procedures CPT4 Code: 1749449 Description: 67591 - WC PHYS LEVEL 4 - NEW PT ICD-10 Diagnosis Description M86.172 Other acute osteomyelitis, left ankle and foot L97.512 Non-pressure chronic ulcer of other part of right foot with fat L97.524 Non-pressure chronic ulcer of other part of  left foot with necr G82.54 Quadriplegia, C5-C7 incomplete Modifier: layer exposed osis of bone Quantity: 1 Electronic Signature(s) Signed: 12/09/2017 11:11:19 AM By: Worthy Keeler PA-C Entered By: Worthy Keeler on 12/08/2017 18:18:59

## 2017-12-11 ENCOUNTER — Ambulatory Visit (HOSPITAL_COMMUNITY)
Admission: RE | Admit: 2017-12-11 | Discharge: 2017-12-11 | Disposition: A | Discharge status: Home / Self Care | Payer: Medicare Other | Source: Ambulatory Visit | Attending: Physician Assistant | Admitting: Physician Assistant

## 2017-12-11 DIAGNOSIS — L97419 Non-pressure chronic ulcer of right heel and midfoot with unspecified severity: Secondary | ICD-10-CM | POA: Diagnosis not present

## 2017-12-11 DIAGNOSIS — S81801A Unspecified open wound, right lower leg, initial encounter: Secondary | ICD-10-CM

## 2017-12-11 DIAGNOSIS — G8250 Quadriplegia, unspecified: Secondary | ICD-10-CM | POA: Insufficient documentation

## 2017-12-11 LAB — POCT I-STAT CREATININE: CREATININE: 0.4 mg/dL — AB (ref 0.61–1.24)

## 2017-12-11 MED ORDER — GADOBENATE DIMEGLUMINE 529 MG/ML IV SOLN
12.0000 mL | Freq: Once | INTRAVENOUS | Status: AC | PRN
  Administered 2017-12-11: 12 mL via INTRAVENOUS

## 2017-12-15 ENCOUNTER — Ambulatory Visit (INDEPENDENT_AMBULATORY_CARE_PROVIDER_SITE_OTHER): Payer: Medicare Other | Admitting: Orthopedic Surgery

## 2017-12-21 ENCOUNTER — Encounter: Payer: Medicare Other | Admitting: Physician Assistant

## 2017-12-21 DIAGNOSIS — M86172 Other acute osteomyelitis, left ankle and foot: Secondary | ICD-10-CM | POA: Diagnosis not present

## 2017-12-23 NOTE — Progress Notes (Signed)
ALVY, ALSOP ([id number]) Visit Report for 12/21/2017 Arrival Information Details Patient Name: [name], [name] Date of Service: 12/21/2017 1:30 PM Medical Record Number: [id number] Patient Account Number: [id number] Date of Birth/Sex: [birth year] (52 y.o. M) Treating RN: Secundino Ginger Primary Care Tyquisha Sharps: [name] Other Clinician: Referring Claron Rosencrans: [name] Treating Aquanetta Schwarz/Extender: Melburn Hake, HOYT Weeks in Treatment: 1 Visit Information History Since Last Visit Added or deleted any medications: No Patient Arrived: Stretcher Any new allergies or adverse reactions: No Arrival Time: 13:27 Had a fall or experienced change in No Accompanied By: transport activities of daily living that may affect Transfer Assistance: Stretcher risk of falls: Patient Identification Verified: Yes Signs or symptoms of abuse/neglect since last visito No Secondary Verification Process Completed: Yes Hospitalized since last visit: No Implantable device outside of the clinic excluding No cellular tissue based products placed in the center since last visit: Has Dressing in Place as Prescribed: Yes Pain Present Now: No Electronic Signature(s) Signed: 12/21/2017 3:14:19 PM By: Secundino Ginger Entered By: Secundino Ginger on 12/21/2017 13:31:44 [name] ([id number]) -------------------------------------------------------------------------------- Clinic Level of Care Assessment Details Patient Name: [name] Date of Service: 12/21/2017 1:30 PM Medical Record Number: [id number] Patient Account Number: [id number] Date of Birth/Sex: [birth year] (52 y.o. M) Treating RN: Cornell Barman Primary Care Duwan Adrian: [name] Other Clinician: Referring Prabhnoor Ellenberger: [name] Treating Pauletta Pickney/Extender: Melburn Hake, HOYT Weeks in Treatment: 1 Clinic Level of Care Assessment Items TOOL 4 Quantity Score []  - Use when only an EandM is performed on FOLLOW-UP visit 0 ASSESSMENTS - Nursing Assessment  / Reassessment []  - Reassessment of Co-morbidities (includes updates in patient status) 0 X- 1 5 Reassessment of Adherence to Treatment Plan ASSESSMENTS - Wound and Skin Assessment / Reassessment []  - Simple Wound Assessment / Reassessment - one wound 0 X- 4 5 Complex Wound Assessment / Reassessment - multiple wounds []  - 0 Dermatologic / Skin Assessment (not related to wound area) ASSESSMENTS - Focused Assessment []  - Circumferential Edema Measurements - multi extremities 0 []  - 0 Nutritional Assessment / Counseling / Intervention []  - 0 Lower Extremity Assessment (monofilament, tuning fork, pulses) []  - 0 Peripheral Arterial Disease Assessment (using hand held doppler) ASSESSMENTS - Ostomy and/or Continence Assessment and Care []  - Incontinence Assessment and Management 0 []  - 0 Ostomy Care Assessment and Management (repouching, etc.) PROCESS - Coordination of Care []  - Simple Patient / Family Education for ongoing care 0 X- 1 20 Complex (extensive) Patient / Family Education for ongoing care []  - 0 Staff obtains Programmer, systems, Records, Test Results / Process Orders []  - 0 Staff telephones HHA, Nursing Homes / Clarify orders / etc []  - 0 Routine Transfer to another Facility (non-emergent condition) []  - 0 Routine Hospital Admission (non-emergent condition) []  - 0 New Admissions / Biomedical engineer / Ordering NPWT, Apligraf, etc. []  - 0 Emergency Hospital Admission (emergent condition) X- 1 10 Simple Discharge Coordination Cage, Kallin B. ([id number]) []  - 0 Complex (extensive) Discharge Coordination PROCESS - Special Needs []  - Pediatric / Minor Patient Management 0 []  - 0 Isolation Patient Management []  - 0 Hearing / Language / Visual special needs []  - 0 Assessment of Community assistance (transportation, D/C planning, etc.) []  - 0 Additional assistance / Altered mentation []  - 0 Support Surface(s) Assessment (bed, cushion, seat, etc.) INTERVENTIONS  - Wound Cleansing / Measurement []  - Simple Wound Cleansing - one wound 0 X- 4 5 Complex Wound Cleansing - multiple wounds X- 1 5 Wound Imaging (photographs - any number  of wounds) []  - 0 Wound Tracing (instead of photographs) []  - 0 Simple Wound Measurement - one wound X- 4 5 Complex Wound Measurement - multiple wounds INTERVENTIONS - Wound Dressings []  - Small Wound Dressing one or multiple wounds 0 []  - 0 Medium Wound Dressing one or multiple wounds X- 4 20 Large Wound Dressing one or multiple wounds []  - 0 Application of Medications - topical []  - 0 Application of Medications - injection INTERVENTIONS - Miscellaneous []  - External ear exam 0 []  - 0 Specimen Collection (cultures, biopsies, blood, body fluids, etc.) []  - 0 Specimen(s) / Culture(s) sent or taken to Lab for analysis []  - 0 Patient Transfer (multiple staff / Civil Service fast streamer / Similar devices) []  - 0 Simple Staple / Suture removal (25 or less) []  - 0 Complex Staple / Suture removal (26 or more) []  - 0 Hypo / Hyperglycemic Management (close monitor of Blood Glucose) []  - 0 Ankle / Brachial Index (ABI) - do not check if billed separately X- 1 5 Vital Signs Tadros, Milon B. ([id number]) Has the patient been seen at the hospital within the last three years: Yes Total Score: 185 Level Of Care: New/Established - Level 5 Electronic Signature(s) Signed: 12/21/2017 4:52:26 PM By: Gretta Cool, BSN, RN, CWS, Kim RN, BSN Entered By: Gretta Cool, BSN, RN, CWS, Kim on 12/21/2017 14:15:53 [name] ([id number]) -------------------------------------------------------------------------------- Encounter Discharge Information Details Patient Name: [name] Date of Service: 12/21/2017 1:30 PM Medical Record Number: [id number] Patient Account Number: [id number] Date of Birth/Sex: 1965-08-25 (52 y.o. M) Treating RN: Montey Hora Primary Care Kittie Krizan: [name] Other Clinician: Referring Adair Lauderback: [name] Treating Kevion Fatheree/Extender: Melburn Hake, HOYT Weeks in Treatment: 1 Encounter Discharge Information Items Discharge Condition: Stable Ambulatory Status: Stretcher Discharge Destination: Home Transportation: Ambulance Accompanied By: EMS Schedule Follow-up Appointment: Yes Clinical Summary of Care: Electronic Signature(s) Signed: 12/21/2017 3:20:53 PM By: Montey Hora Entered By: Montey Hora on 12/21/2017 15:20:53 [name] ([id number]) -------------------------------------------------------------------------------- Lower Extremity Assessment Details Patient Name: [name] Date of Service: 12/21/2017 1:30 PM Medical Record Number: [id number] Patient Account Number: [id number] Date of Birth/Sex: 1966/03/26 (52 y.o. M) Treating RN: Secundino Ginger Primary Care Delaine Hernandez: [name] Other Clinician: Referring Jory Welke: [name] Treating Nochum Fenter/Extender: Melburn Hake, HOYT Weeks in Treatment: 1 Vascular Assessment Pulses: Posterior Tibial Extremity colors, hair growth, and conditions: Temperature of Extremity: [Left:Warm] [Right:Warm] Capillary Refill: [Left:< 3 seconds] [Right:< 3 seconds] Toe Nail Assessment Left: Right: Thick: Yes Yes Discolored: Yes Yes Deformed: Yes Yes Improper Length and Hygiene: Yes Electronic Signature(s) Signed: 12/21/2017 3:14:19 PM By: Secundino Ginger Entered By: Secundino Ginger on 12/21/2017 14:04:22 [name] ([id number]) -------------------------------------------------------------------------------- Multi Wound Chart Details Patient Name: [name] Date of Service: 12/21/2017 1:30 PM Medical Record Number: [id number] Patient Account Number: [id number] Date of Birth/Sex: 06-Nov-1965 (52 y.o. 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] [3:Pressure Ulcer] Comorbid History: [1:Myocardial Infarction, History of pressure wounds, Rheumatoid Arthritis, Paraplegia, Confinement Anxiety] [2:Myocardial Infarction, History of pressure wounds, Rheumatoid Arthritis, Paraplegia, Confinement Anxiety] [3:N/A] Date Acquired: [1:09/08/2017] [2:09/07/2017] [3:10/07/2017] Weeks of Treatment: [1:1] [2:1] [3:1]  Wound Status: [1:Open] [2:Open] [3:Healed - Epithelialized] Clustered Wound: [1:Yes] [2:No] [3:No] Pending Amputation on [1:Yes] [2:Yes] [3:Yes] Presentation: Measurements L x W x D [1:5.1x1.2x0.2] [2:4.7x2.3x0.4] [3:0x0x0] (cm) Area (cm) : [1:4.807] [2:8.49] [3:0] Volume (cm) : [1:0.961] [2:3.396] [3:0] % Reduction in Area: [1:59.20%] [2:21.40%] [3:100.00%] % Reduction in Volume: [1:59.20%] [2:60.70%] [3:100.00%] Classification: [1:Category/Stage IV] [2:Full Thickness With Exposed Support Structures] [3:Category/Stage III] Exudate Amount: [1:Large] [2:Medium] [3:N/A] Exudate Type: [1:Sanguinous] [2:Serosanguineous] [3:N/A] Exudate Color: [1:red] [2:red, brown] [3:N/A] Foul Odor After Cleansing: [1:Yes] [2:Yes] [3:N/A] Odor Anticipated Due to [1:No] [2:No] [3:N/A] Product Use: Wound Margin: [1:Indistinct, nonvisible] [2:Indistinct, nonvisible] [3:N/A] Granulation Amount: [1:None Present (0%)] [2:None Present (0%)] [3:N/A] Granulation Quality: [1:N/A] [2:N/A] [3:N/A] Necrotic Amount: [1:Large (67-100%)] [2:Large (67-100%)] [3:N/A] Necrotic Tissue: [1:Eschar] [2:Eschar] [3:N/A] Exposed Structures: [1:Fat Layer (Subcutaneous Tissue)  Exposed: Yes Bone: Yes] [2:Fat Layer (Subcutaneous Tissue) Exposed: Yes Bone: Yes] [3:N/A] Epithelialization: None None N/A Periwound Skin Texture: Scarring: Yes Scarring: Yes No Abnormalities Noted Excoriation: No Induration: No Callus: No Crepitus: No Rash: No Periwound Skin Moisture: Maceration: No Dry/Scaly: Yes No Abnormalities Noted Dry/Scaly: No Maceration: No Periwound Skin Color: Erythema: Yes Atrophie Blanche: No No Abnormalities Noted Cyanosis: No Ecchymosis: No Erythema: No Hemosiderin Staining: No Mottled: No Pallor: No Rubor: No Erythema Location: Circumferential N/A N/A Temperature: No Abnormality No Abnormality N/A Tenderness on Palpation: No No No Wound Preparation: Ulcer Cleansing: Ulcer Cleansing: N/A Rinsed/Irrigated with Saline Rinsed/Irrigated with Saline Topical Anesthetic Applied: Topical Anesthetic Applied: Other: lidocaine 4% Other: lidocaine 4% Wound Number: 4 5 N/A Photos: No Photos No Photos N/A Wound Location: Left Lower Leg - Posterior, Left Lower Leg - Posterior, N/A Distal Proximal Wounding Event: Pressure Injury Pressure Injury N/A Primary Etiology: Pressure Ulcer Pressure Ulcer N/A Comorbid History: Myocardial Infarction, History Myocardial Infarction, History N/A of pressure wounds, of pressure wounds, Rheumatoid Arthritis, Rheumatoid Arthritis, Paraplegia, Confinement Paraplegia, Confinement Anxiety Anxiety Date Acquired: 12/17/2017 12/17/2017 N/A Weeks of Treatment: 0 0 N/A Wound Status: Open Open N/A Clustered Wound: No No N/A Pending Amputation on No No N/A Presentation: Measurements L x W x D 3.2x1.5x0.1 5.2x0.8x0.1 N/A (cm) Area (cm) : 3.77 3.267 N/A Volume (cm) : 0.377 0.327 N/A % Reduction in Area: 0.00% 0.00% N/A % Reduction in Volume: 0.00% 0.00% N/A Classification: Category/Stage III Category/Stage III N/A Exudate Amount: N/A N/A N/A Exudate Type: N/A N/A N/A Exudate Color: N/A N/A N/A Foul Odor After  Cleansing: Yes Yes N/A Odor Anticipated Due to No No N/A Product Use: Wound Margin: N/A N/A N/A Chimenti, Keller B. ([id number]) Granulation Amount: Large (67-100%) Large (67-100%) N/A Granulation Quality: Red, Pink Red N/A Necrotic Amount: None Present (0%) Small (1-33%) N/A Necrotic Tissue: N/A Eschar N/A Exposed Structures: Fat Layer (Subcutaneous Fat Layer (Subcutaneous N/A Tissue) Exposed: Yes Tissue) Exposed: Yes Fascia: No Tendon: No Muscle: No Joint: No Bone: No Epithelialization: N/A N/A N/A Periwound Skin Texture: Excoriation: No Excoriation: No N/A Induration: No Induration: No Callus: No Callus: No Crepitus: No Crepitus: No Rash: No Rash: No Scarring: No Scarring: No Periwound Skin Moisture: No Abnormalities Noted Maceration: No N/A Dry/Scaly: No Periwound Skin Color: Atrophie Blanche: No Atrophie Blanche: No N/A Cyanosis: No Cyanosis: No Ecchymosis: No Ecchymosis: No Erythema: No Erythema: No Hemosiderin Staining: No Hemosiderin Staining: No Mottled: No Mottled: No Pallor: No Pallor: No Rubor: No Rubor: No Erythema Location: N/A N/A N/A Temperature: N/A N/A N/A Tenderness on Palpation: No No N/A Wound Preparation: Ulcer Cleansing: Ulcer Cleansing: N/A Rinsed/Irrigated with Saline Rinsed/Irrigated with Saline Topical Anesthetic Applied: Topical Anesthetic  Applied: Other: lidocaine 4% Other: lidocaine 4% Treatment Notes Electronic Signature(s) Signed: 12/21/2017 4:52:26 PM By: Gretta Cool, BSN, RN, CWS, Kim RN, BSN Entered By: Gretta Cool, BSN, RN, CWS, Kim on 12/21/2017 14:08:38 [name] ([id number]) -------------------------------------------------------------------------------- Multi-Disciplinary Care Plan Details Patient Name: [name] Date of Service: 12/21/2017 1:30 PM Medical Record Number: [id number] Patient Account Number: [id number] Date of Birth/Sex: 1966/02/08 (52 y.o. M) Treating RN: Cornell Barman Primary Care  Tiffanye Hartmann: [name] Other Clinician: Referring Alayza Pieper: [name] Treating Kammy Klett/Extender: Melburn Hake, HOYT Weeks in Treatment: 1 Active Inactive ` Nutrition Nursing Diagnoses: Potential for alteratiion in Nutrition/Potential for imbalanced nutrition Goals: Patient/caregiver agrees to and verbalizes understanding of need to use nutritional supplements and/or vitamins as prescribed Date Initiated: 12/08/2017 Target Resolution Date: 03/05/2018 Goal Status: Active Interventions: Assess patient nutrition upon admission and as needed per policy Notes: ` Orientation to the Wound Care Program Nursing Diagnoses: Knowledge deficit related to the wound healing center program Goals: Patient/caregiver will verbalize understanding of the [place] Date Initiated: 12/08/2017 Target Resolution Date: 03/05/2018 Goal Status: Active Interventions: Provide education on orientation to the wound center Notes: ` Wound/Skin Impairment Nursing Diagnoses: Impaired tissue integrity Goals: Ulcer/skin breakdown will heal within 14 weeks Date Initiated: 12/08/2017 Target Resolution Date: 03/05/2018 Goal Status: Active Interventions: KYLYN, SOOKRAM ([id number]) Assess patient/caregiver ability to obtain necessary supplies Assess patient/caregiver ability to perform ulcer/skin care regimen upon admission and as needed Assess ulceration(s) every visit Notes: Electronic Signature(s) Signed: 12/21/2017 4:52:26 PM By: Gretta Cool, BSN, RN, CWS, Kim RN, BSN Entered By: Gretta Cool, BSN, RN, CWS, Kim on 12/21/2017 14:08:30 [name] ([id number]) -------------------------------------------------------------------------------- Pain Assessment Details Patient Name: [name] Date of Service: 12/21/2017 1:30 PM Medical Record Number: [id number] Patient Account Number: [id number] Date of Birth/Sex: October 17, 1965 (52 y.o. M) Treating RN: Secundino Ginger Primary Care Shawnita Krizek:  [name] Other Clinician: Referring Chazlyn Cude: [name] Treating Maximillion Gill/Extender: Melburn Hake, HOYT Weeks in Treatment: 1 Active Problems Location of Pain Severity and Description of Pain Patient Has Paino No Site Locations Pain Management and Medication Current Pain Management: Goals for Pain Management pt denies any pain at this time. Electronic Signature(s) Signed: 12/21/2017 3:14:19 PM By: Secundino Ginger Entered By: Secundino Ginger on 12/21/2017 13:31:59 [name] ([id number]) -------------------------------------------------------------------------------- Patient/Caregiver Education Details Patient Name: [name] Date of Service: 12/21/2017 1:30 PM Medical Record Number: [id number] Patient Account Number: [id number] Date of Birth/Gender: 1965-11-02 (52 y.o. M) Treating RN: Montey Hora Primary Care Physician: [name] Other Clinician: Referring Physician: [name] Treating Physician/Extender: Sharalyn Ink in Treatment: 1 Education Assessment Education Provided To: Patient Education Topics Provided Wound/Skin Impairment: Handouts: Other: wound care as ordered Methods: Demonstration, Explain/Verbal Responses: State content correctly Electronic Signature(s) Signed: 12/21/2017 4:11:20 PM By: Montey Hora Entered By: Montey Hora on 12/21/2017 15:21:17 [name] ([id number]) -------------------------------------------------------------------------------- Wound Assessment Details Patient Name: [name] Date of Service: 12/21/2017 1:30 PM Medical Record Number: [id number] Patient Account Number: [id number] Date of Birth/Sex: [birth year] (52 y.o. M) Treating RN: Secundino Ginger Primary Care Santiel Topper: [name] Other Clinician: Referring Maddock Finigan: [name] Treating Metha Kolasa/Extender: Melburn Hake, HOYT Weeks in Treatment: 1 Wound Status Wound Number: 1 Primary Pressure Ulcer Etiology: Wound Location: Right Foot -  Dorsal Wound Open Wounding Event: Gradually Appeared Status: Date Acquired: 09/08/2017 Comorbid Myocardial Infarction, History of pressure Weeks Of Treatment: 1 History: wounds, Rheumatoid Arthritis, Paraplegia, Clustered Wound: Yes Confinement Anxiety Pending Amputation On Presentation Photos Photo Uploaded By: Secundino Ginger on 12/21/2017 14:10:56 Wound  Measurements Length: (cm) 5.1 Width: (cm) 1.2 Depth: (cm) 0.2 Area: (cm) 4.807 Volume: (cm) 0.961 % Reduction in Area: 59.2% % Reduction in Volume: 59.2% Epithelialization: None Tunneling: No Undermining: No Wound Description Classification: Category/Stage IV Wound Margin: Indistinct, nonvisible Exudate Amount: Large Exudate Type: Sanguinous Exudate Color: red Foul Odor After Cleansing: Yes Due to Product Use: No Slough/Fibrino Yes Wound Bed Granulation Amount: None Present (0%) Exposed Structure Necrotic Amount: Large (67-100%) Fat Layer (Subcutaneous Tissue) Exposed: Yes Necrotic Quality: Eschar Bone Exposed: Yes Periwound Skin Texture Texture Color No Abnormalities Noted: No No Abnormalities Noted: No Scarring: Yes Erythema: Yes Huron, Koehn B. ([id number]) Moisture Erythema Location: Circumferential No Abnormalities Noted: No Temperature / Pain Dry / Scaly: No Temperature: No Abnormality Maceration: No Wound Preparation Ulcer Cleansing: Rinsed/Irrigated with Saline Topical Anesthetic Applied: Other: lidocaine 4%, Treatment Notes Wound #1 (Right, Dorsal Foot) 1. Cleansed with: Clean wound with Normal Saline 2. Anesthetic Topical Lidocaine 4% cream to wound bed prior to debridement 4. Dressing Applied: Calcium Alginate with Silver 5. Secondary Dressing Applied ABD Pad Kerlix/Conform 7. Secured with Tape Notes Silvercell, kerlix and netting Electronic Signature(s) Signed: 12/21/2017 3:14:19 PM By: Secundino Ginger Entered By: Secundino Ginger on 12/21/2017 13:53:47 [name]  ([id number]) -------------------------------------------------------------------------------- Wound Assessment Details Patient Name: [name] Date of Service: 12/21/2017 1:30 PM Medical Record Number: [id number] Patient Account Number: [id number] Date of Birth/Sex: [birth year] (52 y.o. 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 - Dorsal Wound Open Wounding Event: Gradually Appeared Status: Date Acquired: 09/07/2017 Comorbid Myocardial Infarction, History of pressure Weeks Of Treatment: 1 History: wounds, Rheumatoid Arthritis, Paraplegia, Clustered Wound: No Confinement Anxiety Pending Amputation On Presentation Photos Photo Uploaded By: Secundino Ginger on 12/21/2017 14:10:57 Wound Measurements Length: (cm) 4.7 Width: (cm) 2.3 Depth: (cm) 0.4 Area: (cm) 8.49 Volume: (cm) 3.396 % Reduction in Area: 21.4% % Reduction in Volume: 60.7% Epithelialization: None Tunneling: No Undermining: No Wound Description Full Thickness With Exposed Support Foul Odor Classification: Structures Due to Pr Wound Margin: Indistinct, nonvisible Slough/Fi Exudate Medium Amount: Exudate Type: Serosanguineous Exudate Color: red, brown After Cleansing: Yes oduct Use: No brino Yes Wound Bed Granulation Amount: None Present (0%) Exposed Structure Necrotic Amount: Large (67-100%) Fat Layer (Subcutaneous Tissue) Exposed: Yes Necrotic Quality: Eschar Bone Exposed: Yes Periwound Skin Texture Texture Color No Abnormalities Noted: No No Abnormalities Noted: No Birge, Juaquin B. ([id number]) Callus: No Atrophie Blanche: No Crepitus: No Cyanosis: No Excoriation: No Ecchymosis: No Induration: No Erythema: No Rash: No Hemosiderin Staining: No Scarring:  Yes Mottled: No Pallor: No Moisture Rubor: No No Abnormalities Noted: No Dry / Scaly: Yes Temperature / Pain Maceration: No Temperature: No Abnormality Wound Preparation Ulcer Cleansing: Rinsed/Irrigated with Saline Topical Anesthetic Applied: Other: lidocaine 4%, Treatment Notes Wound #2 (Left, Dorsal Foot) 1. Cleansed with: Clean wound with Normal Saline 2. Anesthetic Topical Lidocaine 4% cream to wound bed prior to debridement 4. Dressing Applied: Calcium Alginate with Silver 5. Secondary Dressing Applied ABD Pad Kerlix/Conform 7. Secured with Tape Notes Silvercell, kerlix and netting Electronic Signature(s) Signed: 12/21/2017 3:14:19 PM By: Secundino Ginger Entered By: Secundino Ginger on 12/21/2017 13:53:18 [name] ([id number]) -------------------------------------------------------------------------------- Wound Assessment Details Patient Name: [name] Date of Service: 12/21/2017 1:30 PM Medical Record Number: [id number] Patient Account Number: [id number] Date of Birth/Sex: 1965-09-28 (52 y.o. M) Treating RN: Cletus Gash,  Wendi Primary Care Solace Wendorff: [name] Other Clinician: Referring Deliana Avalos: [name] Treating Laneisha Mino/Extender: Melburn Hake, HOYT Weeks in Treatment: 1 Wound Status Wound Number: 3 Primary Etiology: Pressure Ulcer Wound Location: Left Knee Wound Status: Healed - Epithelialized Wounding Event: Pressure Injury Date Acquired: 10/07/2017 Weeks Of Treatment: 1 Clustered Wound: No Pending Amputation On Presentation Wound Measurements Length: (cm) 0 Width: (cm) 0 Depth: (cm) 0 Area: (cm) 0 Volume: (cm) 0 % Reduction in Area: 100% % Reduction in Volume: 100% Wound Description Classification: Category/Stage III Periwound Skin Texture Texture Color No Abnormalities Noted: No No Abnormalities Noted: No Moisture No Abnormalities Noted: No Electronic Signature(s) Signed: 12/21/2017 3:14:19 PM By: Secundino Ginger Entered By: Secundino Ginger on  12/21/2017 13:48:02 [name] ([id number]) -------------------------------------------------------------------------------- Wound Assessment Details Patient Name: [name] Date of Service: 12/21/2017 1:30 PM Medical Record Number: [id number] Patient Account Number: [id number] Date of Birth/Sex: 08/14/1965 (52 y.o. M) Treating RN: Secundino Ginger Primary Care Charniece Venturino: [name] Other Clinician: Referring Duanna Runk: [name] Treating Chizara Mena/Extender: Melburn Hake, HOYT Weeks in Treatment: 1 Wound Status Wound Number: 4 Primary Pressure Ulcer Etiology: Wound Location: Left Lower Leg - Posterior, Distal Wound Open Wounding Event: Pressure Injury Status: Date Acquired: 12/17/2017 Comorbid Myocardial Infarction, History of pressure Weeks Of Treatment: 0 History: wounds, Rheumatoid Arthritis, Paraplegia, Clustered Wound: No Confinement Anxiety Photos Photo Uploaded By: Secundino Ginger on 12/21/2017 14:13:38 Wound Measurements Length: (cm) 3.2 Width: (cm) 1.5 Depth: (cm) 0.1 Area: (cm) 3.77 Volume: (cm) 0.377 % Reduction in Area: 0% % Reduction in Volume: 0% Tunneling: No Undermining: No Wound Description Classification: Category/Stage III Foul Odor After Cleansing: Yes Due to Product Use: No Slough/Fibrino No Wound Bed Granulation Amount: Large (67-100%) Exposed Structure Granulation Quality: Red, Pink Fat Layer (Subcutaneous Tissue) Exposed: Yes Necrotic Amount: None Present (0%) Periwound Skin Texture Texture Color No Abnormalities Noted: No No Abnormalities Noted: No Callus: No Atrophie Blanche: No Crepitus: No Cyanosis: No Excoriation: No Ecchymosis: No Maxham, Worthington B. ([id number]) Induration: No Erythema: No Rash: No Hemosiderin Staining: No Scarring: No Mottled: No Pallor: No Moisture Rubor: No No Abnormalities Noted: No Wound Preparation Ulcer Cleansing: Rinsed/Irrigated with Saline Topical Anesthetic Applied: Other:  lidocaine 4%, Treatment Notes Wound #4 (Left, Distal, Posterior Lower Leg) 1. Cleansed with: Clean wound with Normal Saline 2. Anesthetic Topical Lidocaine 4% cream to wound bed prior to debridement 4. Dressing Applied: Calcium Alginate with Silver 5. Secondary Dressing Applied ABD Pad Kerlix/Conform 7. Secured with Tape Notes Silvercell, kerlix and netting Electronic Signature(s) Signed: 12/21/2017 3:14:19 PM By: Secundino Ginger Entered By: Secundino Ginger on 12/21/2017 14:00:17 [name] ([id number]) -------------------------------------------------------------------------------- Wound Assessment Details Patient Name: [name] Date of Service: 12/21/2017 1:30 PM Medical Record Number: [id number] Patient Account Number: [id number] Date of Birth/Sex: 02-11-1966 (52 y.o. M) Treating RN: Secundino Ginger Primary Care Avian Greenawalt: [name] Other Clinician: Referring Stephanne Greeley: [name] Treating Gurnoor Ursua/Extender: Melburn Hake, HOYT Weeks in Treatment: 1 Wound Status Wound Number: 5 Primary Pressure Ulcer Etiology: Wound Location: Left Lower Leg - Posterior, Proximal Wound Open Wounding Event: Pressure Injury Status: Date Acquired: 12/17/2017 Comorbid Myocardial Infarction, History of pressure Weeks Of Treatment: 0 History: wounds, Rheumatoid Arthritis, Paraplegia, Clustered Wound: No Confinement Anxiety Photos Photo Uploaded By: Secundino Ginger on 12/21/2017 14:14:25 Wound Measurements Length: (cm) 5.2 Width: (cm) 0.8 Depth: (cm) 0.1 Area: (cm) 3.267 Volume: (cm) 0.327 % Reduction in Area: 0% % Reduction in Volume: 0% Tunneling: No Undermining: No Wound Description Classification: Category/Stage III Foul Odor After Cleansing: Yes Due to  Product Use: No Slough/Fibrino No Wound Bed Granulation Amount: Large (67-100%) Exposed Structure Granulation Quality: Red Fascia Exposed: No Necrotic Amount: Small (1-33%) Fat Layer (Subcutaneous Tissue) Exposed:  Yes Necrotic Quality: Eschar Tendon Exposed: No Muscle Exposed: No Joint Exposed: No Bone Exposed: No Periwound Skin Texture Texture Color Gundry, Joas B. ([id number]) No Abnormalities Noted: No No Abnormalities Noted: No Callus: No Atrophie Blanche: No Crepitus: No Cyanosis: No Excoriation: No Ecchymosis: No Induration: No Erythema: No Rash: No Hemosiderin Staining: No Scarring: No Mottled: No Pallor: No Moisture Rubor: No No Abnormalities Noted: No Dry / Scaly: No Maceration: No Wound Preparation Ulcer Cleansing: Rinsed/Irrigated with Saline Topical Anesthetic Applied: Other: lidocaine 4%, Treatment Notes Wound #5 (Left, Proximal, Posterior Lower Leg) 1. Cleansed with: Clean wound with Normal Saline 2. Anesthetic Topical Lidocaine 4% cream to wound bed prior to debridement 4. Dressing Applied: Calcium Alginate with Silver 5. Secondary Dressing Applied ABD Pad Kerlix/Conform 7. Secured with Tape Notes Silvercell, kerlix and netting Electronic Signature(s) Signed: 12/21/2017 3:14:19 PM By: Secundino Ginger Entered By: Secundino Ginger on 12/21/2017 14:03:10 [name] ([id number]) -------------------------------------------------------------------------------- Vitals Details Patient Name: [name] Date of Service: 12/21/2017 1:30 PM Medical Record Number: [id number] Patient Account Number: [id number] Date of Birth/Sex: November 30, 1965 (52 y.o. M) Treating RN: Secundino Ginger Primary Care Mckaela Howley: [name] Other Clinician: Referring Andrez Lieurance: [name] Treating Elby Blackwelder/Extender: Melburn Hake, HOYT Weeks in Treatment: 1 Vital Signs Time Taken: 13:32 Temperature (F): 98.3 Height (in): 72 Pulse (bpm): 87 Respiratory Rate (breaths/min): 18 Blood Pressure (mmHg): 102/74 Reference Range: 80 - 120 mg / dl Electronic Signature(s) Signed: 12/21/2017 3:14:19 PM By: Secundino Ginger Entered By: Secundino Ginger on 12/21/2017 13:32:42

## 2017-12-23 NOTE — Progress Notes (Signed)
HRITHIK, BOSCHEE ([id number]) Visit Report for 12/21/2017 Chief Complaint Document Details Patient Name: [name], [name] Date of Service: 12/21/2017 1:30 PM Medical Record Number: [id number] Patient Account Number: [id number] Date of Birth/Sex: May 01, 1965 (52 y.o. M) Treating RN: Cornell Barman Primary Care Provider: [name] Other Clinician: Referring Provider: [name] Treating Provider/Extender: Melburn Hake, HOYT Weeks in Treatment: 1 Information Obtained from: Patient Chief Complaint Bilateral Foot Ulcers Electronic Signature(s) Signed: 12/21/2017 3:58:56 PM By: Worthy Keeler PA-C Entered By: Worthy Keeler on 12/21/2017 14:06:48 [name] ([id number]) -------------------------------------------------------------------------------- HPI Details Patient Name: [name] Date of Service: 12/21/2017 1:30 PM Medical Record Number: [id number] Patient Account Number: [id number] Date of Birth/Sex: 1966-03-31 (52 y.o. M) Treating RN: Cornell Barman Primary Care Provider: [name] Other Clinician: Referring Provider: [name] Treating Provider/Extender: Melburn Hake, HOYT Weeks in Treatment: 1 History of Present Illness Associated Signs and Symptoms: Patient has a history of confirmed osteomyelitis of the left foot according to MRI July 2019. 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 HPI Description: 12/08/17 on evaluation today patient presents for initial evaluation and our clinic concerning issues he has been having with his feet bilaterally although the left more than right most recently. He was admitted to hospital where he was placed on IV vancomycin which actually he continue to utilize until discharge and even when discharged continue to be on until around 16 August according to what he tells me. His PICC line was removed at that point. Nonetheless he has been seen in the wound care  center in [facility] before transferring to [place] due to the fact that the doctor there left and they are no longer open. Nonetheless he does have significant osteomyelitis of the left foot noted on MRI which revealed significant issues with necrotic bone including a portion of the distal region of his left great toe which was felt to possibly either be missing as a result of amputation or potentially resorption. Either way he also had osteomyelitis noted of the digit, metatarsal region, cuneiform, and navicular bones. There is definite bone exposure noted externally at this point in time. In regard to the right foot he has issues with ulcerations here as well although he states that recently he's had no issues until this just blistered and reopened. Apparently Xeroform was used in this has caused things to be somewhat more moist and open more according to the patient. He's otherwise been tolerating the dressing changes without complication using silver alginate dressings. He has no discomfort but does seem to be extremely stressed regarding the fact that he did see Dr. 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 The patient stated per notes reviewed that he did not want to have any surgery and therefore has a repeat appointment scheduled with the surgeon on 12/15/17. With all that being said he feels like that he really has not been alerted to what was going on in the severity that was until just recently and has a lot of questions in that regard. I'll be see I explained that seeing him for the first time today I cannot answer a lot of those questions that he has. 12/21/17 on evaluation today patient actually appears to be doing a little better in regard to the right foot in particular. There is one area where there still definitive bone noted although the MRI which I did review today that we had  ordered was negative for any evidence of osteomyelitis which is good  news. Nonetheless there was some necrotic bone noted. Overall the patient appears to be doing fairly well however in regard to the right foot. Unfortunately he does have a new open area on the posterior aspect of his left leg as well as the continued area of necrosis noted in the central portion of the foot. He states that he's actually going to be sent for reevaluation specifically to a limb salvage clinic at [facility] he believes this is something that is primary care provider has been working with. They were just awaiting the results of the MRI. Electronic Signature(s) Signed: 12/21/2017 3:58:56 PM By: Worthy Keeler PA-C Entered By: Worthy Keeler on 12/21/2017 14:28:07 [name] ([id number]) -------------------------------------------------------------------------------- Physical Exam Details Patient Name: [name] Date of Service: 12/21/2017 1:30 PM Medical Record Number: [id number] Patient Account Number: [id number] Date of Birth/Sex: September 28, 1965 (52 y.o. M) Treating RN: Cornell Barman Primary Care Provider: [name] Other Clinician: Referring Provider: [name] Treating Provider/Extender: Melburn Hake, HOYT Weeks in Treatment: 1 Constitutional Well-nourished and well-hydrated in no acute distress. Respiratory normal breathing without difficulty. Psychiatric this patient is able to make decisions and demonstrates good insight into disease process. Alert and Oriented x 3. pleasant and cooperative. Notes Patient's wound bed at this point continued showed signs of continued necrosis as far as the bone noted on the surface of the wound is concerned of left foot. Regard to the right foot he does appear to have been exposed although again there was no evidence of more significant osteomyelitis noted at this point. Electronic Signature(s) Signed: 12/21/2017 3:58:56 PM By: Worthy Keeler PA-C Entered By: Worthy Keeler on 12/21/2017 14:31:55 [name] ([id number]) -------------------------------------------------------------------------------- Physician Orders Details Patient Name: [name] Date of Service: 12/21/2017 1:30 PM Medical Record Number: [id number] Patient Account Number: [id number] Date of Birth/Sex: 1965/11/23 (52 y.o. 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 Wound Cleansing Wound #1 Right,Dorsal Foot o Cleanse wound with mild soap and water Wound #2 Left,Dorsal Foot o Cleanse wound with mild soap and water Wound #4 Left,Distal,Posterior Lower Leg o Cleanse wound with mild soap and water Wound #5 Left,Proximal,Posterior Lower Leg o Cleanse wound with mild soap and water Primary Wound Dressing Wound #1 Right,Dorsal Foot o Silver Alginate Wound #2 Left,Dorsal Foot o Silver Alginate Wound #4 Left,Distal,Posterior Lower Leg o Silver Alginate Wound #5 Left,Proximal,Posterior Lower Leg o Silver Alginate Secondary Dressing Wound #1 Right,Dorsal Foot o Gauze, ABD and Kerlix/Conform Wound #2 Left,Dorsal Foot o Gauze, ABD and Kerlix/Conform Kleiman, Jayde B. ([id number]) Wound #4 Left,Distal,Posterior Lower Leg o Gauze, ABD and Kerlix/Conform Wound #5 Left,Proximal,Posterior Lower Leg o Gauze, ABD and Kerlix/Conform Dressing Change Frequency Wound #1 Right,Dorsal Foot o Change Dressing Monday, Wednesday,  Friday Wound #2 Left,Dorsal Foot o Change Dressing Monday, Wednesday, Friday Wound #4 Left,Distal,Posterior Lower Leg o Change Dressing Monday, Wednesday, Friday Wound #5 Left,Proximal,Posterior Lower Leg o Change Dressing Monday, Wednesday, Friday Follow-up Appointments Wound #1 Right,Dorsal Foot   o Return Appointment in 1 week. Wound #2 Left,Dorsal Foot o Return Appointment in 1 week. Wound #4 Left,Distal,Posterior Lower Leg o Return Appointment in 1 week. Wound #5 Left,Proximal,Posterior Lower Leg o Return Appointment in 1 week. Off-Loading Wound #1 Right,Dorsal Foot o Turn and reposition every 2 hours Wound #2 Left,Dorsal Foot o Turn and reposition every 2 hours Wound #4 Left,Distal,Posterior Lower Leg o Turn and reposition every 2 hours Wound #5 Left,Proximal,Posterior Lower Leg o Turn and reposition every 2 hours Additional Orders / Instructions Wound #1 Right,Dorsal Foot o Vitamin A; Vitamin C, Zinc - Please add a multivitamin daily that has 100% of vitamin A, vitamin C and Zinc supplements o Increase protein intake. Wound #2 Left,Dorsal Foot o Vitamin A; Vitamin C, Zinc - Please add a multivitamin daily that has 100% of vitamin A, vitamin C and Zinc supplements o Increase protein intake. Natalio, [place] Jencarlo BMarland Kitchen ([id number]) Wound #4 Left,Distal,Posterior Lower Leg o Vitamin A; Vitamin C, Zinc - Please add a multivitamin daily that has 100% of vitamin A, vitamin C and Zinc supplements o Increase protein intake. Wound #5 Left,Proximal,Posterior Lower Leg o Vitamin A; Vitamin C, Zinc - Please add a multivitamin daily that has 100% of vitamin A, vitamin C and Zinc supplements o Increase protein intake. Home Health Wound #1 [place] Visits o Home Health Nurse may visit PRN to address patientos wound care needs. o FACE TO FACE ENCOUNTER: MEDICARE and MEDICAID PATIENTS: I certify that this patient  is under my care and that I had a face-to-face encounter that meets the physician face-to-face encounter requirements with this patient on this date. The encounter with the patient was in whole or in part for the following MEDICAL CONDITION: (primary reason for [place]) MEDICAL NECESSITY: I certify, that based on my findings, NURSING services are a medically necessary home health service. HOME BOUND STATUS: I certify that my clinical findings support that this patient is homebound (i.e., Due to illness or injury, pt requires aid of supportive devices such as crutches, cane, wheelchairs, walkers, the use of special transportation or the assistance of another person to leave their place of residence. There is a normal inability to leave the home and doing so requires considerable and taxing effort. Other absences are for medical reasons / religious services and are infrequent or of short duration when for other reasons). o If current dressing causes regression in wound condition, may D/C ordered dressing product/s and apply Normal Saline Moist Dressing daily until next [place] / Other MD appointment. [place] of regression in wound condition at [phone]. o Please direct any NON-WOUND related issues/requests for orders to patient's Primary Care Physician Wound #2 [place] Visits o Home Health Nurse may visit PRN to address patientos wound care needs. o FACE TO FACE ENCOUNTER: MEDICARE and MEDICAID PATIENTS: I certify that this patient is under my care and that I had a face-to-face encounter that meets the physician face-to-face encounter requirements with this patient on this date. The encounter with the patient was in whole or in part for the following MEDICAL CONDITION: (primary reason for [place]) MEDICAL NECESSITY: I certify, that based on my findings, NURSING services are a medically necessary home  health service. HOME BOUND STATUS: I certify that my clinical findings support that this patient is homebound (i.e., Due to illness or injury, pt requires aid of supportive devices such as crutches, cane, wheelchairs, walkers, the use of special  transportation or the assistance of another person to leave their place of residence. There is a normal inability to leave the home and doing so requires considerable and taxing effort. Other absences are for medical reasons / religious services and are infrequent or of short duration when for other reasons). o If current dressing causes regression in wound condition, may D/C ordered dressing product/s and apply Normal Saline Moist Dressing daily until next [place] / Other MD appointment. [place] of regression in wound condition at [phone]. o Please direct any NON-WOUND related issues/requests for orders to patient's Primary Care Physician Wound #4 [place] Nurse may visit PRN to address patientos wound care needs. o FACE TO FACE ENCOUNTER: MEDICARE and MEDICAID PATIENTS: I certify that this patient is under my care and that I had a face-to-face encounter that meets the physician face-to-face encounter requirements with this patient on this date. The encounter with the patient was in whole or in part for the following MEDICAL CONDITION: (primary reason for [place]) MEDICAL NECESSITY: I certify, that based on my findings, NURSING services are a medically necessary home health service. HOME BOUND STATUS: I certify that my clinical findings support that this patient is homebound (i.e., Due to illness or injury, pt requires aid of supportive devices such as crutches, cane, wheelchairs, walkers, the use of special transportation or the Mucarabones, Elis B. ([id number]) assistance of another person to leave their place of residence. There  is a normal inability to leave the home and doing so requires considerable and taxing effort. Other absences are for medical reasons / religious services and are infrequent or of short duration when for other reasons). o If current dressing causes regression in wound condition, may D/C ordered dressing product/s and apply Normal Saline Moist Dressing daily until next [place] / Other MD appointment. [place] of regression in wound condition at [phone]. o Please direct any NON-WOUND related issues/requests for orders to patient's Primary Care Physician Wound #5 Left,Proximal,Posterior Lower Leg o [place] Nurse may visit PRN to address patientos wound care needs. o FACE TO FACE ENCOUNTER: MEDICARE and MEDICAID PATIENTS: I certify that this patient is under my care and that I had a face-to-face encounter that meets the physician face-to-face encounter requirements with this patient on this date. The encounter with the patient was in whole or in part for the following MEDICAL CONDITION: (primary reason for [place]) MEDICAL NECESSITY: I certify, that based on my findings, NURSING services are a medically necessary home health service. HOME BOUND STATUS: I certify that my clinical findings support that this patient is homebound (i.e., Due to illness or injury, pt requires aid of supportive devices such as crutches, cane, wheelchairs, walkers, the use of special transportation or the assistance of another person to leave their place of residence. There is a normal inability to leave the home and doing so requires considerable and taxing effort. Other absences are for medical reasons / religious services and are infrequent or of short duration when for other reasons). o If current dressing causes regression in wound condition, may D/C ordered dressing product/s and apply Normal Saline Moist Dressing daily until  next [place] / Other MD appointment. [place] of regression in wound condition at [phone]. o Please direct any NON-WOUND related issues/requests for orders to patient's Primary Care Physician Notes Please supply protective bandages  for knee. Electronic Signature(s) Signed: 12/21/2017 3:58:56 PM By: Worthy Keeler PA-C Signed: 12/21/2017 4:52:26 PM By: Gretta Cool BSN, RN, CWS, Kim RN, BSN Entered By: Gretta Cool, BSN, RN, CWS, Kim on 12/21/2017 14:22:25 [name] ([id number]) -------------------------------------------------------------------------------- Problem List Details Patient Name: [name] Date of Service: 12/21/2017 1:30 PM Medical Record Number: [id number] Patient Account Number: [id number] Date of Birth/Sex: Mar 10, 1966 (52 y.o. M) Treating RN: Cornell Barman Primary Care Provider: [name] Other Clinician: Referring Provider: [name] Treating Provider/Extender: Melburn Hake, HOYT Weeks in Treatment: 1 Active Problems ICD-10 Evaluated Encounter Code Description Active Date Today Diagnosis [phone] Other acute osteomyelitis, left ankle and foot 12/08/2017 No Yes L97.524 Non-pressure chronic ulcer of other part of left foot with 12/08/2017 No Yes necrosis of bone L97.512 Non-pressure chronic ulcer of other part of right foot with fat 12/08/2017 No Yes layer exposed G82.54 Quadriplegia, C5-C7 incomplete 12/08/2017 No Yes R63.6 Underweight 12/08/2017 No Yes E44.0 Moderate protein-calorie malnutrition 12/08/2017 No Yes 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 M) Treating RN: Cornell Barman Primary Care Provider: [name] Other Clinician: Referring Provider: [name] Treating Provider/Extender: Melburn Hake, HOYT Weeks in Treatment: 1 Subjective Chief Complaint Information obtained from Patient Bilateral Foot Ulcers History of Present Illness (HPI) The following HPI elements were documented for the patient's wound: Associated Signs and Symptoms: Patient has a history of confirmed osteomyelitis of the left foot according to MRI July 2019. 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 12/08/17 on evaluation today patient presents for initial evaluation and our clinic concerning issues he has been having with his feet bilaterally although the left more than right most recently. He was admitted to hospital where he was placed on IV vancomycin which actually he continue to utilize until discharge and even when discharged continue to be on until around 16 August according to what he tells me. His PICC line was removed at that point. Nonetheless he has been seen in the wound care center in [facility] before transferring to [place] due to the fact that the doctor there left and they are no longer open. Nonetheless he does have significant osteomyelitis of the left foot noted on MRI which revealed significant issues with necrotic bone including a portion of the distal region of his left great toe which was felt to possibly either be missing as a result of amputation or potentially resorption. Either way he also had osteomyelitis noted of the digit, metatarsal region, cuneiform, and navicular bones. There is definite bone exposure noted externally at this point in time. In regard to the right foot he has issues with ulcerations here as well although he states that recently he's had no  issues until this just blistered and reopened. Apparently Xeroform was used in this has caused things to be somewhat more moist and open more according to the patient. He's otherwise been tolerating the dressing changes without complication using silver alginate dressings. He has no discomfort but does seem to be extremely stressed regarding the fact that he did see Dr. 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 The patient  stated per notes reviewed that he did not want to have any surgery and therefore has a repeat appointment scheduled with the surgeon on 12/15/17. With all that being said he feels like that he really has not been alerted to what was going on in the severity that was until just recently and has a lot of questions in that regard. I'll be see I explained that seeing him for the first time today I cannot answer a lot of those questions that he has. 12/21/17 on evaluation today patient actually appears to be doing a little better in regard to the right foot in particular. There is one area where there still definitive bone noted although the MRI which I did review today that we had ordered was negative for any evidence of osteomyelitis which is good news. Nonetheless there was some necrotic bone noted. Overall the patient appears to be doing fairly well however in regard to the right foot. Unfortunately he does have a new open area on the posterior aspect of his left leg as well as the continued area of necrosis noted in the central portion of the foot. He states that he's actually going to be sent for reevaluation specifically to a limb salvage clinic at [facility] he believes this is something that is primary care provider has been working with. They were just awaiting the results of the MRI. Patient History Information obtained from Patient. Family History Cancer - Siblings,Mother, Diabetes - Siblings, Heart Disease - Father, Hypertension -  Father, Kidney Disease - Siblings, No family history of Lung Disease, Seizures, Stroke, Thyroid Problems, Tuberculosis. Social History Never smoker, Marital Status - Separated, Alcohol Use - Never, Drug Use - No History, Caffeine Use - Moderate. Arya, Nazim B. ([id number]) Review of Systems (ROS) Respiratory The patient has no complaints or symptoms. Cardiovascular The patient has no complaints or symptoms. Psychiatric The patient has no complaints or symptoms. Objective Constitutional Well-nourished and well-hydrated in no acute distress. Vitals Time Taken: 1:32 PM, Height: 72 in, Temperature: 98.3 F, Pulse: 87 bpm, Respiratory Rate: 18 breaths/min, Blood Pressure: 102/74 mmHg. Respiratory normal breathing without difficulty. Psychiatric this patient is able to make decisions and demonstrates good insight into disease process. Alert and Oriented x 3. pleasant and cooperative. General Notes: Patient's wound bed at this point continued showed signs of continued necrosis as far as the bone noted on the surface of the wound is concerned of left foot. Regard to the right foot he does appear to have been exposed although again there was no evidence of more significant osteomyelitis noted at this point. Integumentary (Hair, Skin) Wound #1 status is Open. Original cause of wound was Gradually Appeared. The wound is located on the Right,Dorsal Foot. The wound measures 5.1cm length x 1.2cm width x 0.2cm depth; 4.807cm^2 area and 0.961cm^3 volume. There is bone and Fat Layer (Subcutaneous Tissue) Exposed exposed. There is no tunneling or undermining noted. There is a large amount of sanguinous drainage noted. Foul odor after cleansing was noted. The wound margin is indistinct and nonvisible. There is no granulation within the wound bed. There is a large (67-100%) amount of necrotic tissue within the wound bed including Eschar. The periwound skin appearance exhibited: Scarring, Erythema.  The periwound skin appearance did not exhibit: Dry/Scaly, Maceration. The surrounding wound skin color is noted with erythema which is circumferential. Periwound temperature was noted as No Abnormality. Wound #2 status is Open. Original cause of wound was Gradually Appeared. The wound is located on the  Left,Dorsal Foot. The wound measures 4.7cm length x 2.3cm width x 0.4cm depth; 8.49cm^2 area and 3.396cm^3 volume. There is bone and Fat Layer (Subcutaneous Tissue) Exposed exposed. There is no tunneling or undermining noted. There is a medium amount of serosanguineous drainage noted. Foul odor after cleansing was noted. The wound margin is indistinct and nonvisible. There is no granulation within the wound bed. There is a large (67-100%) amount of necrotic tissue within the wound bed including Eschar. The periwound skin appearance exhibited: Scarring, Dry/Scaly. The periwound skin appearance did not exhibit: Callus, Crepitus, Excoriation, Induration, Rash, Maceration, Atrophie Blanche, Cyanosis, Ecchymosis, Hemosiderin Staining, Mottled, Pallor, Rubor, Erythema. Periwound temperature was noted as No Abnormality. Wound #3 status is Healed - Epithelialized. Original cause of wound was Pressure Injury. The wound is located on the Left Knee. The wound measures 0cm length x 0cm width x 0cm depth; 0cm^2 area and 0cm^3 volume. Ferreras, Vuong B. ([id number]) Wound #4 status is Open. Original cause of wound was Pressure Injury. The wound is located on the Left,Distal,Posterior Lower Leg. The wound measures 3.2cm length x 1.5cm width x 0.1cm depth; 3.77cm^2 area and 0.377cm^3 volume. There is Fat Layer (Subcutaneous Tissue) Exposed exposed. There is no tunneling or undermining noted. Foul odor after cleansing was noted. There is large (67-100%) red, pink granulation within the wound bed. There is no necrotic tissue within the wound bed. The periwound skin appearance did not exhibit: Callus, Crepitus,  Excoriation, Induration, Rash, Scarring, Atrophie Blanche, Cyanosis, Ecchymosis, Hemosiderin Staining, Mottled, Pallor, Rubor, Erythema. Wound #5 status is Open. Original cause of wound was Pressure Injury. The wound is located on the Left,Proximal,Posterior Lower Leg. The wound measures 5.2cm length x 0.8cm width x 0.1cm depth; 3.267cm^2 area and 0.327cm^3 volume. There is Fat Layer (Subcutaneous Tissue) Exposed exposed. There is no tunneling or undermining noted. Foul odor after cleansing was noted. There is large (67-100%) red granulation within the wound bed. There is a small (1-33%) amount of necrotic tissue within the wound bed including Eschar. The periwound skin appearance did not exhibit: Callus, Crepitus, Excoriation, Induration, Rash, Scarring, Dry/Scaly, Maceration, Atrophie Blanche, Cyanosis, Ecchymosis, Hemosiderin Staining, Mottled, Pallor, Rubor, Erythema. 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, C5-C7 incomplete Underweight Moderate protein-calorie malnutrition Gastrostomy status Plan Wound Cleansing: Wound #1 Right,Dorsal Foot: Cleanse wound with mild soap and water Wound #2 Left,Dorsal Foot: Cleanse wound with mild soap and water Wound #4 Left,Distal,Posterior Lower Leg: Cleanse wound with mild soap and water Wound #5 Left,Proximal,Posterior Lower Leg: Cleanse wound with mild soap and water Primary Wound Dressing: Wound #1 Right,Dorsal Foot: [place] Wound #2 Left,Dorsal Foot: [place] Wound #4 Left,Distal,Posterior Lower Leg: [place] Wound #5 Left,Proximal,Posterior Lower Leg: [place] Secondary Dressing: Wound #1 Right,Dorsal Foot: Gotwalt, Marcellous B. ([id number]) Gauze, ABD and Kerlix/Conform Wound #2 Left,Dorsal  Foot: Gauze, ABD and Kerlix/Conform Wound #4 Left,Distal,Posterior Lower Leg: Gauze, ABD and Kerlix/Conform Wound #5 Left,Proximal,Posterior Lower Leg: Gauze, ABD and Kerlix/Conform Dressing Change Frequency: Wound #1 Right,Dorsal Foot: Change Dressing Monday, Wednesday, Friday Wound #2 Left,Dorsal Foot: Change Dressing Monday, Wednesday, Friday Wound #4 Left,Distal,Posterior Lower Leg: Change Dressing Monday, Wednesday, Friday Wound #5 Left,Proximal,Posterior Lower Leg: Change Dressing Monday, Wednesday, Friday Follow-up Appointments: Wound #1 Right,Dorsal Foot: Return Appointment in 1 week. Wound #2 Left,Dorsal Foot: Return Appointment in 1 week. Wound #4 Left,Distal,Posterior Lower Leg:  Return Appointment in 1 week. Wound #5 Left,Proximal,Posterior Lower Leg: Return Appointment in 1 week. Off-Loading: Wound #1 Right,Dorsal Foot: Turn and reposition every 2 hours Wound #2 Left,Dorsal Foot: Turn and reposition every 2 hours Wound #4 Left,Distal,Posterior Lower Leg: Turn and reposition every 2 hours Wound #5 Left,Proximal,Posterior Lower Leg: Turn and reposition every 2 hours Additional Orders / Instructions: Wound #1 Right,Dorsal Foot: Vitamin A; Vitamin C, Zinc - Please add a multivitamin daily that has 100% of vitamin A, vitamin C and Zinc supplements Increase protein intake. Wound #2 Left,Dorsal Foot: Vitamin A; Vitamin C, Zinc - Please add a multivitamin daily that has 100% of vitamin A, vitamin C and Zinc supplements Increase protein intake. Wound #4 Left,Distal,Posterior Lower Leg: Vitamin A; Vitamin C, Zinc - Please add a multivitamin daily that has 100% of vitamin A, vitamin C and Zinc supplements Increase protein intake. Wound #5 Left,Proximal,Posterior Lower Leg: Vitamin A; Vitamin C, Zinc - Please add a multivitamin daily that has 100% of vitamin A, vitamin C and Zinc supplements Increase protein intake. Home Health: Wound #1 Right,Dorsal Foot: [place] Nurse may visit PRN to address patient s wound care needs. FACE TO FACE ENCOUNTER: MEDICARE and MEDICAID PATIENTS: I certify that this patient is under my care and that I had a face-to-face encounter that meets the physician face-to-face encounter requirements with this patient on this date. The encounter with the patient was in whole or in part for the following MEDICAL CONDITION: (primary reason for [place]) MEDICAL NECESSITY: I certify, that based on my findings, NURSING services are a medically necessary home health service. HOME BOUND STATUS: I certify that my clinical findings support that this patient is homebound (i.e., Due to illness or injury, pt requires aid of supportive devices such as crutches, cane, wheelchairs, walkers, the use of special transportation or the assistance of another person to leave their place of residence. There is a normal inability to leave the home and doing so requires considerable and taxing effort. Other absences are for medical reasons / religious services and are infrequent or of short duration when for other reasons). Crisafulli, Robinson B. ([id number]) If current dressing causes regression in wound condition, may D/C ordered dressing product/s and apply Normal Saline Moist Dressing daily until next [place] / Other MD appointment. [place] of regression in wound condition at [phone]. Please direct any NON-WOUND related issues/requests for orders to patient's Primary Care Physician Wound #2 Left,Dorsal Foot: [place] Nurse may visit PRN to address patient s wound care needs. FACE TO FACE ENCOUNTER: MEDICARE and MEDICAID PATIENTS: I certify that this patient is under my care and that I had a face-to-face encounter that meets the physician face-to-face encounter requirements with this patient on this date. The encounter with the patient was in whole or  in part for the following MEDICAL CONDITION: (primary reason for [place]) MEDICAL NECESSITY: I certify, that based on my findings, NURSING services are a medically necessary home health service. HOME BOUND STATUS: I certify that my clinical findings support that this patient is homebound (i.e., Due to illness or injury, pt requires aid of supportive devices such as crutches, cane, wheelchairs, walkers, the use of special transportation or the assistance of another person to leave their place of residence. There is a normal inability to leave the home and doing so requires considerable and taxing effort. Other absences are for medical reasons / religious services and are  infrequent or of short duration when for other reasons). If current dressing causes regression in wound condition, may D/C ordered dressing product/s and apply Normal Saline Moist Dressing daily until next [place] / Other MD appointment. [place] of regression in wound condition at [phone]. Please direct any NON-WOUND related issues/requests for orders to patient's Primary Care Physician Wound #4 Left,Distal,Posterior Lower Leg: [place] Nurse may visit PRN to address patient s wound care needs. FACE TO FACE ENCOUNTER: MEDICARE and MEDICAID PATIENTS: I certify that this patient is under my care and that I had a face-to-face encounter that meets the physician face-to-face encounter requirements with this patient on this date. The encounter with the patient was in whole or in part for the following MEDICAL CONDITION: (primary reason for [place]) MEDICAL NECESSITY: I certify, that based on my findings, NURSING services are a medically necessary home health service. HOME BOUND STATUS: I certify that my clinical findings support that this patient is homebound (i.e., Due to illness or injury, pt requires aid of supportive devices such as crutches, cane,  wheelchairs, walkers, the use of special transportation or the assistance of another person to leave their place of residence. There is a normal inability to leave the home and doing so requires considerable and taxing effort. Other absences are for medical reasons / religious services and are infrequent or of short duration when for other reasons). If current dressing causes regression in wound condition, may D/C ordered dressing product/s and apply Normal Saline Moist Dressing daily until next [place] / Other MD appointment. [place] of regression in wound condition at [phone]. Please direct any NON-WOUND related issues/requests for orders to patient's Primary Care Physician Wound #5 Left,Proximal,Posterior Lower Leg: [place] Nurse may visit PRN to address patient s wound care needs. FACE TO FACE ENCOUNTER: MEDICARE and MEDICAID PATIENTS: I certify that this patient is under my care and that I had a face-to-face encounter that meets the physician face-to-face encounter requirements with this patient on this date. The encounter with the patient was in whole or in part for the following MEDICAL CONDITION: (primary reason for [place]) MEDICAL NECESSITY: I certify, that based on my findings, NURSING services are a medically necessary home health service. HOME BOUND STATUS: I certify that my clinical findings support that this patient is homebound (i.e., Due to illness or injury, pt requires aid of supportive devices such as crutches, cane, wheelchairs, walkers, the use of special transportation or the assistance of another person to leave their place of residence. There is a normal inability to leave the home and doing so requires considerable and taxing effort. Other absences are for medical reasons / religious services and are infrequent or of short duration when for other reasons). If current dressing causes regression  in wound condition, may D/C ordered dressing product/s and apply Normal Saline Moist Dressing daily until next [place] / Other MD appointment. [place] of regression in wound condition at [phone]. Please direct any NON-WOUND related issues/requests for orders to patient's Primary Care Physician General Notes: Please supply protective bandages for knee. Pearse, Saud B. ([id number]) I do believe that the patient seems to be showing signs of good improvement in regard to the right foot which to be honest is great news. Nonetheless in regard to left foot I'm not sure that things are going to improve dramatically at this point in the  be honest he also has some areas open the posterior portion the leg which had previously been open as well. I think keeping pressure off. Is gonna be the best way to manage this. Nonetheless he would like to talk to the [facility] and you and see before making any other decisions as far as surgery is concerned which I completely understand. Otherwise we can continue with the above wound care orders since things do seem to be a little bit better compared to my last visit with him. Please see above for specific wound care orders. We will see patient for re-evaluation in 2 week(s) here in the clinic. If anything worsens or changes patient will contact our office for additional recommendations. Electronic Signature(s) Signed: 12/21/2017 3:58:56 PM By: Worthy Keeler PA-C Entered By: Worthy Keeler on 12/21/2017 14:32:39 [name] ([id number]) -------------------------------------------------------------------------------- ROS/PFSH Details Patient Name: [name] Date of Service: 12/21/2017 1:30 PM Medical Record Number: [id number] Patient Account Number: [id number] Date of Birth/Sex: June 29, 1965 (52 y.o. M) Treating RN: Cornell Barman Primary Care Provider: [name] Other Clinician: Referring  Provider: [name] Treating Provider/Extender: Melburn Hake, HOYT Weeks in Treatment: 1 Information Obtained From Patient Wound History Do you currently have one or more open woundso Yes How many open wounds do you currently haveo 3 Eyes Medical History: Negative for: Cataracts; Glaucoma; Optic Neuritis Ear/Nose/Mouth/Throat Medical History: Negative for: Chronic sinus problems/congestion; Middle ear problems Hematologic/Lymphatic Medical History: Negative for: Anemia; Human Immunodeficiency Virus; Lymphedema; Sickle Cell Disease Respiratory Complaints and Symptoms: No Complaints or Symptoms Medical History: Negative for: Aspiration; Asthma; Chronic Obstructive Pulmonary Disease (COPD); Pneumothorax; Sleep Apnea; Tuberculosis Cardiovascular Complaints and Symptoms: No Complaints or Symptoms Medical History: Positive for: Myocardial Infarction Negative for: Angina; Arrhythmia; Congestive Heart Failure; Coronary Artery Disease; Deep Vein Thrombosis; Hypertension; Hypotension; Peripheral Arterial Disease; Peripheral Venous Disease; Phlebitis; Vasculitis Gastrointestinal Medical History: Negative for: Cirrhosis ; Colitis; Crohnos; Hepatitis A; Hepatitis B; Hepatitis C Endocrine Medical History: Negative for: Type I Diabetes; Type II Diabetes Roedel, Hilda B. ([id number]) Genitourinary Medical History: Negative for: End Stage Renal Disease Immunological Medical History: Negative for: Lupus Erythematosus; Raynaudos; Scleroderma Integumentary (Skin) Medical History: Positive for: History of pressure wounds Negative for: History of Burn Musculoskeletal Medical History: Positive for: Rheumatoid Arthritis Negative for: Osteoarthritis; Osteomyelitis Neurologic Medical History: Positive for: Paraplegia - neck down paralysis Negative for: Dementia; Neuropathy; Quadriplegia; Seizure Disorder Psychiatric Complaints and Symptoms: No Complaints or Symptoms Medical  History: Positive for: Confinement Anxiety Negative for: Anorexia/bulimia Immunizations Pneumococcal Vaccine: Received Pneumococcal Vaccination: Yes Implantable Devices Family and Social History Cancer: Yes - Siblings,Mother; Diabetes: Yes - Siblings; Heart Disease: Yes - Father; Hypertension: Yes - Father; Kidney Disease: Yes - Siblings; Lung Disease: No; Seizures: No; Stroke: No; Thyroid Problems: No; Tuberculosis: No; Never smoker; Marital Status - Separated; Alcohol Use: Never; Drug Use: No History; Caffeine Use: Moderate; Financial Concerns: No; Food, Clothing or Shelter Needs: No; Support System Lacking: No; Transportation Concerns: No Physician Affirmation I have reviewed and agree with the above information. Electronic Signature(s) Signed: 12/21/2017 3:58:56 PM By: Worthy Keeler PA-C Signed: 12/21/2017 4:52:26 PM By: Gretta Cool BSN, RN, CWS, Kim RN, BSN Entered By: Worthy Keeler on 12/21/2017 14:31:23 [name] ([id number]) -------------------------------------------------------------------------------- SuperBill Details Patient Name: [name] Date of Service: 12/21/2017 Medical Record Number: [id number] Patient Account Number: [id number] Date of Birth/Sex: [birth year] (52 y.o. M) Treating RN: Cornell Barman Primary Care Provider: [name] Other Clinician: Referring Provider: [name] Treating Provider/Extender: Melburn Hake,  HOYT Weeks in Treatment: 1 Diagnosis Coding ICD-10 Codes Code Description [phone] Other acute osteomyelitis, left ankle and foot L97.524 Non-pressure chronic ulcer of other part of left foot with necrosis of bone L97.512 Non-pressure chronic ulcer of other part of right foot with fat layer exposed G82.54 Quadriplegia, C5-C7 incomplete R63.6 Underweight E44.0 Moderate protein-calorie malnutrition Z93.1 Gastrostomy status Facility Procedures CPT4 Code: 05110211 Description: [phone] - WOUND CARE VISIT-LEV 5 EST  PT Modifier: Quantity: 1 Physician Procedures CPT4 Code: 7014103 Description: 01314 - WC PHYS LEVEL 4 - EST PT ICD-10 Diagnosis Description M86.172 Other acute osteomyelitis, left ankle and foot L97.524 Non-pressure chronic ulcer of other part of left foot with necr L97.512 Non-pressure chronic ulcer of other part of  right foot with fat G82.54 Quadriplegia, C5-C7 incomplete Modifier: osis of bone layer exposed Quantity: 1 Electronic Signature(s) Signed: 12/21/2017 3:58:56 PM By: Worthy Keeler PA-C Entered By: Worthy Keeler on 12/21/2017 14:32:57

## 2017-12-31 ENCOUNTER — Telehealth (INDEPENDENT_AMBULATORY_CARE_PROVIDER_SITE_OTHER): Payer: Self-pay | Admitting: Orthopedic Surgery

## 2017-12-31 NOTE — Telephone Encounter (Signed)
11/16/2017 ov note faxed to [facility]

## 2018-01-04 ENCOUNTER — Ambulatory Visit: Payer: Medicare Other | Admitting: Physician Assistant

## 2018-01-08 ENCOUNTER — Encounter: Payer: Medicare Other | Attending: Physician Assistant | Admitting: Physician Assistant

## 2018-01-08 DIAGNOSIS — Z931 Gastrostomy status: Secondary | ICD-10-CM | POA: Insufficient documentation

## 2018-01-08 DIAGNOSIS — L97524 Non-pressure chronic ulcer of other part of left foot with necrosis of bone: Secondary | ICD-10-CM | POA: Diagnosis not present

## 2018-01-08 DIAGNOSIS — E440 Moderate protein-calorie malnutrition: Secondary | ICD-10-CM | POA: Insufficient documentation

## 2018-01-08 DIAGNOSIS — G8254 Quadriplegia, C5-C7 incomplete: Secondary | ICD-10-CM | POA: Insufficient documentation

## 2018-01-08 DIAGNOSIS — M069 Rheumatoid arthritis, unspecified: Secondary | ICD-10-CM | POA: Insufficient documentation

## 2018-01-08 DIAGNOSIS — L97512 Non-pressure chronic ulcer of other part of right foot with fat layer exposed: Secondary | ICD-10-CM | POA: Insufficient documentation

## 2018-01-08 DIAGNOSIS — I252 Old myocardial infarction: Secondary | ICD-10-CM | POA: Diagnosis not present

## 2018-01-08 DIAGNOSIS — M86172 Other acute osteomyelitis, left ankle and foot: Secondary | ICD-10-CM | POA: Insufficient documentation

## 2018-01-08 DIAGNOSIS — L89899 Pressure ulcer of other site, unspecified stage: Secondary | ICD-10-CM | POA: Insufficient documentation

## 2018-01-08 DIAGNOSIS — Z8249 Family history of ischemic heart disease and other diseases of the circulatory system: Secondary | ICD-10-CM | POA: Diagnosis not present

## 2018-01-13 NOTE — Progress Notes (Signed)
MILEN, LENGACHER ([id number]) Visit Report for 01/08/2018 Arrival Information Details Patient Name: [name], [name] Date of Service: 01/08/2018 12:30 PM Medical Record Number: [id number] Patient Account Number: [id number] Date of Birth/Sex: [birth year] (52 y.o. M) Treating RN: Secundino Ginger Primary Care Juwaun Inskeep: [name] Other Clinician: Referring Casmira Cramer: [name] Treating Manual Navarra/Extender: Melburn Hake, HOYT Weeks in Treatment: 4 Visit Information History Since Last Visit Added or deleted any medications: No Patient Arrived: Stretcher Any new allergies or adverse reactions: No Arrival Time: 12:39 Had a fall or experienced change in No Accompanied By: transport activities of daily living that may affect Transfer Assistance: Other risk of falls: Patient Identification Verified: Yes Signs or symptoms of abuse/neglect since last visito No Secondary Verification Process Completed: Yes Hospitalized since last visit: No Implantable device outside of the clinic excluding No cellular tissue based products placed in the center since last visit: Has Dressing in Place as Prescribed: Yes Pain Present Now: No Notes pt stayed in stretcher. Electronic Signature(s) Signed: 01/08/2018 4:32:55 PM By: Secundino Ginger Entered By: Secundino Ginger on 01/08/2018 12:44:33 [name] ([id number]) -------------------------------------------------------------------------------- Clinic Level of Care Assessment Details Patient Name: [name] Date of Service: 01/08/2018 12:30 PM Medical Record Number: [id number] Patient Account Number: [id number] Date of Birth/Sex: 07-06-1965 (52 y.o. M) Treating RN: Montey Hora Primary Care Avangeline Stockburger: [name] Other Clinician: Referring Yasemin Rabon: [name] Treating Wandalene Abrams/Extender: Melburn Hake, HOYT Weeks in Treatment: 4 Clinic Level of Care Assessment Items TOOL 4 Quantity Score []  - Use when only an EandM is performed on FOLLOW-UP visit  0 ASSESSMENTS - Nursing Assessment / Reassessment X - Reassessment of Co-morbidities (includes updates in patient status) 1 10 X- 1 5 Reassessment of Adherence to Treatment Plan ASSESSMENTS - Wound and Skin Assessment / Reassessment []  - Simple Wound Assessment / Reassessment - one wound 0 X- 5 5 Complex Wound Assessment / Reassessment - multiple wounds []  - 0 Dermatologic / Skin Assessment (not related to wound area) ASSESSMENTS - Focused Assessment []  - Circumferential Edema Measurements - multi extremities 0 []  - 0 Nutritional Assessment / Counseling / Intervention X- 1 5 Lower Extremity Assessment (monofilament, tuning fork, pulses) []  - 0 Peripheral Arterial Disease Assessment (using hand held doppler) ASSESSMENTS - Ostomy and/or Continence Assessment and Care []  - Incontinence Assessment and Management 0 []  - 0 Ostomy Care Assessment and Management (repouching, etc.) PROCESS - Coordination of Care X - Simple Patient / Family Education for ongoing care 1 15 []  - 0 Complex (extensive) Patient / Family Education for ongoing care []  - 0 Staff obtains Programmer, systems, Records, Test Results / Process Orders []  - 0 Staff telephones HHA, Nursing Homes / Clarify orders / etc []  - 0 Routine Transfer to another Facility (non-emergent condition) []  - 0 Routine Hospital Admission (non-emergent condition) []  - 0 New Admissions / Biomedical engineer / Ordering NPWT, Apligraf, etc. []  - 0 Emergency Hospital Admission (emergent condition) X- 1 10 Simple Discharge Coordination Detore, Travaughn B. ([id number]) []  - 0 Complex (extensive) Discharge Coordination PROCESS - Special Needs []  - Pediatric / Minor Patient Management 0 []  - 0 Isolation Patient Management []  - 0 Hearing / Language / Visual special needs []  - 0 Assessment of Community assistance (transportation, D/C planning, etc.) []  - 0 Additional assistance / Altered mentation []  - 0 Support Surface(s) Assessment  (bed, cushion, seat, etc.) INTERVENTIONS - Wound Cleansing / Measurement []  - Simple Wound Cleansing - one wound 0 X- 5 5 Complex Wound Cleansing - multiple wounds X- 1  5 Wound Imaging (photographs - any number of wounds) []  - 0 Wound Tracing (instead of photographs) []  - 0 Simple Wound Measurement - one wound X- 5 5 Complex Wound Measurement - multiple wounds INTERVENTIONS - Wound Dressings X - Small Wound Dressing one or multiple wounds 5 10 []  - 0 Medium Wound Dressing one or multiple wounds []  - 0 Large Wound Dressing one or multiple wounds []  - 0 Application of Medications - topical []  - 0 Application of Medications - injection INTERVENTIONS - Miscellaneous []  - External ear exam 0 []  - 0 Specimen Collection (cultures, biopsies, blood, body fluids, etc.) []  - 0 Specimen(s) / Culture(s) sent or taken to Lab for analysis []  - 0 Patient Transfer (multiple staff / Civil Service fast streamer / Similar devices) []  - 0 Simple Staple / Suture removal (25 or less) []  - 0 Complex Staple / Suture removal (26 or more) []  - 0 Hypo / Hyperglycemic Management (close monitor of Blood Glucose) []  - 0 Ankle / Brachial Index (ABI) - do not check if billed separately X- 1 5 Vital Signs Stroschein, Yuriel B. ([id number]) Has the patient been seen at the hospital within the last three years: Yes Total Score: 180 Level Of Care: New/Established - Level 5 Electronic Signature(s) Signed: 01/08/2018 5:05:51 PM By: Montey Hora Entered By: Montey Hora on 01/08/2018 13:43:08 [name] ([id number]) -------------------------------------------------------------------------------- Encounter Discharge Information Details Patient Name: [name] Date of Service: 01/08/2018 12:30 PM Medical Record Number: [id number] Patient Account Number: [id number] Date of Birth/Sex: Mar 16, 1966 (52 y.o. M) Treating RN: Montey Hora Primary Care Benen Weida: [name] Other Clinician: Referring  Kaila Devries: [name] Treating Malek Skog/Extender: Melburn Hake, HOYT Weeks in Treatment: 4 Encounter Discharge Information Items Discharge Condition: Stable Ambulatory Status: Stretcher Discharge Destination: Home Transportation: Ambulance Accompanied By: EMS Schedule Follow-up Appointment: Yes Clinical Summary of Care: Electronic Signature(s) Signed: 01/08/2018 5:05:51 PM By: Montey Hora Entered By: Montey Hora on 01/08/2018 13:51:15 [name] ([id number]) -------------------------------------------------------------------------------- Lower Extremity Assessment Details Patient Name: [name] Date of Service: 01/08/2018 12:30 PM Medical Record Number: [id number] Patient Account Number: [id number] Date of Birth/Sex: 28-Apr-1965 (52 y.o. M) Treating RN: Secundino Ginger Primary Care Bobbe Quilter: [name] Other Clinician: Referring Dariyon Urquilla: [name] Treating Nadina Fomby/Extender: Melburn Hake, HOYT Weeks in Treatment: 4 Vascular Assessment Pulses: Dorsalis Pedis Palpable: [Left:Yes] [Right:Yes] Posterior Tibial Popliteal Palpable: [Left:Yes] [Right:Yes] Extremity colors, hair growth, and conditions: Temperature of Extremity: [Left:Warm] [Right:Warm] Capillary Refill: [Left:< 3 seconds] [Right:< 3 seconds] Toe Nail Assessment Left: Right: Thick: No No Discolored: No No Deformed: No No Improper Length and Hygiene: No No Electronic Signature(s) Signed: 01/08/2018 4:32:55 PM By: Secundino Ginger Entered By: Secundino Ginger on 01/08/2018 13:08:55 [name] ([id number]) -------------------------------------------------------------------------------- Multi Wound Chart Details Patient Name: [name] Date of Service: 01/08/2018 12:30 PM Medical Record Number: [id number] Patient Account Number: [id number] Date of Birth/Sex: 1965/05/11 (52 y.o. M) Treating RN: Montey Hora Primary Care Deshonda Cryderman: [name] Other Clinician: Referring [place] Hanlon: [name] Treating Heidemarie Goodnow/Extender: Melburn Hake, HOYT Weeks in Treatment: 4 Vital Signs Height(in): 72 Pulse(bpm): 80 Weight(lbs): Blood Pressure(mmHg): 98/64 Body Mass Index(BMI): Temperature(F): 97.9 Respiratory Rate 16 (breaths/min): Photos: [1:No Photos] [2:No Photos] [4:No Photos] Wound Location: [1:Right Foot - Dorsal] [2:Left Foot - Dorsal] [4:Left Lower Leg - Posterior, Distal] Wounding Event: [1:Gradually Appeared] [2:Gradually Appeared] [4:Pressure Injury] 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] [4:Pressure Ulcer] Comorbid History: [1:Myocardial Infarction, History Myocardial Infarction, History Myocardial Infarction, History of pressure wounds, Rheumatoid Arthritis, Paraplegia, Confinement Anxiety] [2:of pressure wounds,  Rheumatoid Arthritis, Paraplegia,  Confinement Anxiety] [4:of pressure wounds, Rheumatoid Arthritis, Paraplegia, Confinement Anxiety] Date Acquired: [1:09/08/2017] [2:09/07/2017] [4:12/17/2017] Weeks of Treatment: [1:4] [2:4] [4:2] Wound Status: [1:Open] [2:Open] [4:Open] Clustered Wound: [1:Yes] [2:No] [4:No] Pending Amputation on [1:Yes] [2:Yes] [4:No] Presentation: Measurements L x W x D [1:0.1x0.1x0.1] [2:4.4x1.9x0.3] [4:1.2x0.5x0.1] (cm) Area (cm) : [1:0.008] [2:6.566] [4:0.471] Volume (cm) : [1:0.001] [2:1.97] [4:0.047] % Reduction in Area: [1:99.90%] [2:39.20%] [4:87.50%] % Reduction in Volume: [1:100.00%] [2:77.20%] [4:87.50%] Classification: [1:Category/Stage IV] [2:Full Thickness With Exposed Support Structures] [4:Category/Stage III] Exudate Amount: [1:Large] [2:None Present] [4:Small] Exudate Type: [1:Sanguinous] [2:N/A] [4:Sanguinous] Exudate Color: [1:red] [2:N/A] [4:red] Foul Odor After Cleansing: [1:Yes] [2:Yes] [4:Yes] Odor Anticipated Due to [1:No] [2:No] [4:No] Product Use: Wound Margin: [1:Indistinct, nonvisible] [2:Indistinct, nonvisible] [4:N/A] Granulation Amount: [1:None Present (0%)] [2:None Present  (0%)] [4:Large (67-100%)] Granulation Quality: [1:N/A] [2:N/A] [4:Red, Pink] Necrotic Amount: [1:Large (67-100%)] [2:Large (67-100%)] [4:None Present (0%)] Necrotic Tissue: [1:Eschar] [2:Eschar] [4:N/A] Exposed Structures: [4:Fat Layer (Subcutaneous Tissue) Exposed: Yes] Fat Layer (Subcutaneous Fat Layer (Subcutaneous Fascia: No Tissue) Exposed: Yes Tissue) Exposed: Yes Tendon: No Bone: Yes Bone: Yes Muscle: No Joint: No Bone: No Epithelialization: None None N/A Periwound Skin Texture: Scarring: Yes Scarring: Yes Scarring: Yes Excoriation: No Excoriation: No Induration: No Induration: No Callus: No Callus: No Crepitus: No Crepitus: No Rash: No Rash: No Periwound Skin Moisture: Maceration: No Dry/Scaly: Yes No Abnormalities Noted Dry/Scaly: No Maceration: No Periwound Skin Color: Erythema: Yes Atrophie Blanche: No Atrophie Blanche: No Cyanosis: No Cyanosis: No Ecchymosis: No Ecchymosis: No Erythema: No Erythema: No Hemosiderin Staining: No Hemosiderin Staining: No Mottled: No Mottled: No Pallor: No Pallor: No Rubor: No Rubor: No Erythema Location: Circumferential N/A N/A Temperature: No Abnormality No Abnormality N/A Tenderness on Palpation: No No No Wound Preparation: Ulcer Cleansing: Ulcer Cleansing: Ulcer Cleansing: Rinsed/Irrigated with Saline, Rinsed/Irrigated with Saline, Rinsed/Irrigated with Saline, Wound Cleanser Wound Cleanser Wound Cleanser Topical Anesthetic Applied: Topical Anesthetic Applied: Topical Anesthetic Applied: Other: lidocaine 4% Other: lidocaine 4% Other: lidocaine 4% Wound Number: 5 6 N/A Photos: No Photos No Photos N/A Wound Location: Left Lower Leg - Posterior, Right Lower Leg - Posterior, N/A Proximal Proximal Wounding Event: Pressure Injury Shear/Friction N/A Primary Etiology: Pressure Ulcer Abrasion N/A Comorbid History: Myocardial Infarction, History Myocardial Infarction, History N/A of pressure wounds, of  pressure wounds, Rheumatoid Arthritis, Rheumatoid Arthritis, Paraplegia, Confinement Paraplegia, Confinement Anxiety Anxiety Date Acquired: 12/17/2017 01/05/2018 N/A Weeks of Treatment: 2 0 N/A Wound Status: Open Open N/A Clustered Wound: No No N/A Pending Amputation on No No N/A Presentation: Measurements L x W x D 5x0.9x0.1 0.3x0.3x0.1 N/A (cm) Area (cm) : 3.534 0.071 N/A Volume (cm) : 0.353 0.007 N/A % Reduction in Area: -8.20% 0.00% N/A % Reduction in Volume: -8.00% 0.00% N/A Classification: Category/Stage III Unclassifiable N/A Exudate Amount: Small Small N/A Exudate Type: Sanguinous Serosanguineous N/A Dowd, Tyquez B. ([id number]) Exudate Color: red red, brown N/A Foul Odor After Cleansing: Yes No N/A Odor Anticipated Due to No N/A N/A Product Use: Wound Margin: N/A N/A N/A Granulation Amount: None Present (0%) None Present (0%) N/A Granulation Quality: N/A N/A N/A Necrotic Amount: Small (1-33%) None Present (0%) N/A Necrotic Tissue: Eschar N/A N/A Exposed Structures: Fat Layer (Subcutaneous Fascia: No N/A Tissue) Exposed: Yes Fat Layer (Subcutaneous Fascia: No Tissue) Exposed: No Tendon: No Tendon: No Muscle: No Muscle: No Joint: No Joint: No Bone: No Bone: No Epithelialization: N/A N/A N/A Periwound Skin Texture: Excoriation: No Excoriation: No N/A Induration: No Induration: No Callus: No Callus: No Crepitus: No Crepitus: No Rash:  No Rash: No Scarring: No Scarring: No Periwound Skin Moisture: Maceration: No Maceration: No N/A Dry/Scaly: No Dry/Scaly: No Periwound Skin Color: Atrophie Blanche: No Atrophie Blanche: No N/A Cyanosis: No Cyanosis: No Ecchymosis: No Ecchymosis: No Erythema: No Erythema: No Hemosiderin Staining: No Hemosiderin Staining: No Mottled: No Mottled: No Pallor: No Pallor: No Rubor: No Rubor: No Erythema Location: N/A N/A N/A Temperature: N/A N/A N/A Tenderness on Palpation: No No N/A Wound  Preparation: Ulcer Cleansing: Ulcer Cleansing: Wound N/A Rinsed/Irrigated with Saline, Cleanser Wound Cleanser Topical Anesthetic Applied: Topical Anesthetic Applied: Other: lidocaine 4% Other: lidocaine 4% Treatment Notes Electronic Signature(s) Signed: 01/08/2018 5:05:51 PM By: Montey Hora Entered By: Montey Hora on 01/08/2018 13:30:55 [name] ([id number]) -------------------------------------------------------------------------------- [place] Details Patient Name: [name] Date of Service: 01/08/2018 12:30 PM Medical Record Number: [id number] Patient Account Number: [id number] Date of Birth/Sex: May 11, 1965 (52 y.o. M) Treating RN: Montey Hora Primary Care Mylik Pro: [name] Other Clinician: Referring Katlin Ciszewski: [name] Treating Amilia Vandenbrink/Extender: Melburn Hake, HOYT Weeks in Treatment: 4 Active Inactive ` Nutrition Nursing Diagnoses: Potential for alteratiion in Nutrition/Potential for imbalanced nutrition Goals: Patient/caregiver agrees to and verbalizes understanding of need to use nutritional supplements and/or vitamins as prescribed Date Initiated: 12/08/2017 Target Resolution Date: 03/05/2018 Goal Status: Active Interventions: Assess patient nutrition upon admission and as needed per policy Notes: ` Orientation to the Wound Care Program Nursing Diagnoses: Knowledge deficit related to the wound healing center program Goals: Patient/caregiver will verbalize understanding of the [facility] Date Initiated: 12/08/2017 Target Resolution Date: 03/05/2018 Goal Status: Active Interventions: Provide education on orientation to the wound center Notes: ` Wound/Skin Impairment Nursing Diagnoses: Impaired tissue integrity Goals: Ulcer/skin breakdown will heal within 14 weeks Date Initiated: 12/08/2017 Target Resolution Date: 03/05/2018 Goal Status: Active Interventions: TEAGHAN, FORMICA  ([id number]) Assess patient/caregiver ability to obtain necessary supplies Assess patient/caregiver ability to perform ulcer/skin care regimen upon admission and as needed Assess ulceration(s) every visit Notes: Electronic Signature(s) Signed: 01/08/2018 5:05:51 PM By: Montey Hora Entered By: Montey Hora on 01/08/2018 13:30:45 [name] ([id number]) -------------------------------------------------------------------------------- Pain Assessment Details Patient Name: [name] Date of Service: 01/08/2018 12:30 PM Medical Record Number: [id number] Patient Account Number: [id number] Date of Birth/Sex: [birth year] (52 y.o. M) Treating RN: Secundino Ginger Primary Care Myonna Chisom: [name] Other Clinician: Referring Jentry Warnell: [name] Treating Aliannah Holstrom/Extender: Melburn Hake, HOYT Weeks in Treatment: 4 Active Problems Location of Pain Severity and Description of Pain Patient Has Paino No Site Locations Pain Management and Medication Current Pain Management: Goals for Pain Management pt denies any pain at this time. Electronic Signature(s) Signed: 01/08/2018 4:32:55 PM By: Secundino Ginger Entered By: Secundino Ginger on 01/08/2018 12:44:53 [name] ([id number]) -------------------------------------------------------------------------------- Patient/Caregiver Education Details Patient Name: [name] Date of Service: 01/08/2018 12:30 PM Medical Record Number: [id number] Patient Account Number: [id number] Date of Birth/Gender: [birth year] (52 y.o. M) Treating RN: Montey Hora Primary Care Physician: [name] Other Clinician: Referring Physician: [name] Treating Physician/Extender: Sharalyn Ink in Treatment: 4 Education Assessment Education Provided To: Patient Education Topics Provided Wound/Skin Impairment: Handouts: Other: wound care sa ordered Methods: Demonstration, Explain/Verbal Responses: State content correctly Electronic  Signature(s) Signed: 01/08/2018 5:05:51 PM By: Montey Hora Entered By: Montey Hora on 01/08/2018 13:43:24 [name] ([id number]) -------------------------------------------------------------------------------- Wound Assessment Details Patient Name: [name] Date of Service: 01/08/2018 12:30 PM Medical Record Number: [id number] Patient Account Number: [id number] Date of Birth/Sex: 1966-03-16 (52 y.o. M) Treating RN: Secundino Ginger Primary Care  Mallarie Voorhies: [name] Other Clinician: Referring Kawon Willcutt: [name] Treating Alyxandria Wentz/Extender: Melburn Hake, HOYT Weeks in Treatment: 4 Wound Status Wound Number: 1 Primary Pressure Ulcer Etiology: Wound Location: Right Foot - Dorsal Wound Open Wounding Event: Gradually Appeared Status: Date Acquired: 09/08/2017 Comorbid Myocardial Infarction, History of pressure Weeks Of Treatment: 4 History: wounds, Rheumatoid Arthritis, Paraplegia, Clustered Wound: Yes Confinement Anxiety Pending Amputation On Presentation Photos Photo Uploaded By: Secundino Ginger on 01/08/2018 16:05:18 Wound Measurements Length: (cm) 0.1 Width: (cm) 0.1 Depth: (cm) 0.1 Area: (cm) 0.008 Volume: (cm) 0.001 % Reduction in Area: 99.9% % Reduction in Volume: 100% Epithelialization: None Tunneling: No Undermining: No Wound Description Classification: Category/Stage IV Wound Margin: Indistinct, nonvisible Exudate Amount: Large Exudate Type: Sanguinous Exudate Color: red Foul Odor After Cleansing: Yes Due to Product Use: No Slough/Fibrino Yes Wound Bed Granulation Amount: None Present (0%) Exposed Structure Necrotic Amount: Large (67-100%) Fat Layer (Subcutaneous Tissue) Exposed: Yes Necrotic Quality: Eschar Bone Exposed: Yes Periwound Skin Texture Texture Color No Abnormalities Noted: No No Abnormalities Noted: No Scarring: Yes Erythema: Yes Parzych, Jireh B. ([id number]) Moisture Erythema Location: Circumferential No  Abnormalities Noted: No Temperature / Pain Dry / Scaly: No Temperature: No Abnormality Maceration: No Wound Preparation Ulcer Cleansing: Rinsed/Irrigated with Saline, Wound Cleanser Topical Anesthetic Applied: Other: lidocaine 4%, Treatment Notes Wound #1 (Right, Dorsal Foot) 1. Cleansed with: Clean wound with Normal Saline 2. Anesthetic Topical Lidocaine 4% cream to wound bed prior to debridement 4. Dressing Applied: Calcium Alginate with Silver 5. Secondary Dressing Applied ABD Pad Kerlix/Conform 7. Secured with Tape Notes Silvercell, kerlix and netting Electronic Signature(s) Signed: 01/08/2018 4:32:55 PM By: Secundino Ginger Entered By: Secundino Ginger on 01/08/2018 12:59:56 [name] ([id number]) -------------------------------------------------------------------------------- Wound Assessment Details Patient Name: [name] Date of Service: 01/08/2018 12:30 PM Medical Record Number: [id number] Patient Account Number: [id number] Date of Birth/Sex: 1965/07/08 (52 y.o. M) Treating RN: Secundino Ginger Primary Care Bessye Stith: [name] Other Clinician: Referring Raydel Hosick: [name] Treating Jose Alleyne/Extender: Melburn Hake, HOYT Weeks in Treatment: 4 Wound Status Wound Number: 2 Primary Bacterial Osteomyelitis Etiology: Wound Location: Left Foot - Dorsal Wound Open Wounding Event: Gradually Appeared Status: Date Acquired: 09/07/2017 Comorbid Myocardial Infarction, History of pressure Weeks Of Treatment: 4 History: wounds, Rheumatoid Arthritis, Paraplegia, Clustered Wound: No Confinement Anxiety Pending Amputation On Presentation Photos Photo Uploaded By: Secundino Ginger on 01/08/2018 16:05:18 Wound Measurements Length: (cm) 4.4 Width: (cm) 1.9 Depth: (cm) 0.3 Area: (cm) 6.566 Volume: (cm) 1.97 % Reduction in Area: 39.2% % Reduction in Volume: 77.2% Epithelialization: None Tunneling: No Undermining: No Wound Description Full Thickness With Exposed Support  Foul Odor Classification: Structures Due to Pr Wound Margin: Indistinct, nonvisible Slough/Fi Exudate None Present Amount: After Cleansing: Yes oduct Use: No brino Yes Wound Bed Granulation Amount: None Present (0%) Exposed Structure Necrotic Amount: Large (67-100%) Fat Layer (Subcutaneous Tissue) Exposed: Yes Necrotic Quality: Eschar Bone Exposed: Yes Periwound Skin Texture Texture Color No Abnormalities Noted: No No Abnormalities Noted: No Callus: No Atrophie Blanche: No Crepitus: No Cyanosis: No Wiemers, Selassie B. ([id number]) Excoriation: No Ecchymosis: No Induration: No Erythema: No Rash: No Hemosiderin Staining: No Scarring: Yes Mottled: No Pallor: No Moisture Rubor: No No Abnormalities Noted: No Dry / Scaly: Yes Temperature / Pain Maceration: No Temperature: No Abnormality Wound Preparation Ulcer Cleansing: Rinsed/Irrigated with Saline, Wound Cleanser Topical Anesthetic Applied: Other: lidocaine 4%, Treatment Notes Wound #2 (Left, Dorsal Foot) 1. Cleansed with: Clean wound with Normal Saline 2. Anesthetic Topical Lidocaine 4% cream to wound bed prior to debridement 4.  Dressing Applied: Calcium Alginate with Silver 5. Secondary Dressing Applied ABD Pad Kerlix/Conform 7. Secured with Tape Notes Silvercell, kerlix and netting Electronic Signature(s) Signed: 01/08/2018 4:32:55 PM By: Secundino Ginger Entered By: Secundino Ginger on 01/08/2018 13:01:56 [name] ([id number]) -------------------------------------------------------------------------------- Wound Assessment Details Patient Name: [name] Date of Service: 01/08/2018 12:30 PM Medical Record Number: [id number] Patient Account Number: [id number] Date of Birth/Sex: [birth year] (52 y.o. M) Treating RN: Secundino Ginger Primary Care Tempie Gibeault: [name] Other Clinician: Referring Tavarius Grewe: [name] Treating Sister Carbone/Extender: Melburn Hake, HOYT Weeks in Treatment: 4 Wound  Status Wound Number: 4 Primary Pressure Ulcer Etiology: Wound Location: Left Lower Leg - Posterior, Distal Wound Open Wounding Event: Pressure Injury Status: Date Acquired: 12/17/2017 Comorbid Myocardial Infarction, History of pressure Weeks Of Treatment: 2 History: wounds, Rheumatoid Arthritis, Paraplegia, Clustered Wound: No Confinement Anxiety Photos Photo Uploaded By: Secundino Ginger on 01/08/2018 16:07:12 Wound Measurements Length: (cm) 1.2 Width: (cm) 0.5 Depth: (cm) 0.1 Area: (cm) 0.471 Volume: (cm) 0.047 % Reduction in Area: 87.5% % Reduction in Volume: 87.5% Tunneling: No Undermining: No Wound Description Classification: Category/Stage III Exudate Amount: Small Exudate Type: Sanguinous Exudate Color: red Foul Odor After Cleansing: Yes Due to Product Use: No Slough/Fibrino No Wound Bed Granulation Amount: Large (67-100%) Exposed Structure Granulation Quality: Red, Pink Fascia Exposed: No Necrotic Amount: None Present (0%) Fat Layer (Subcutaneous Tissue) Exposed: Yes Tendon Exposed: No Muscle Exposed: No Joint Exposed: No Bone Exposed: No Periwound Skin Texture Texture Color Amezcua, Braelyn B. ([id number]) No Abnormalities Noted: No No Abnormalities Noted: No Callus: No Atrophie Blanche: No Crepitus: No Cyanosis: No Excoriation: No Ecchymosis: No Induration: No Erythema: No Rash: No Hemosiderin Staining: No Scarring: Yes Mottled: No Pallor: No Moisture Rubor: No No Abnormalities Noted: No Wound Preparation Ulcer Cleansing: Rinsed/Irrigated with Saline, Wound Cleanser Topical Anesthetic Applied: Other: lidocaine 4%, Treatment Notes Wound #4 (Left, Distal, Posterior Lower Leg) 1. Cleansed with: Clean wound with Normal Saline 2. Anesthetic Topical Lidocaine 4% cream to wound bed prior to debridement 4. Dressing Applied: Calcium Alginate with Silver 5. Secondary Dressing Applied ABD Pad Kerlix/Conform 7. Secured  with Tape Notes Silvercell, kerlix and netting Electronic Signature(s) Signed: 01/08/2018 4:32:55 PM By: Secundino Ginger Entered By: Secundino Ginger on 01/08/2018 13:08:11 [name] ([id number]) -------------------------------------------------------------------------------- Wound Assessment Details Patient Name: [name] Date of Service: 01/08/2018 12:30 PM Medical Record Number: [id number] Patient Account Number: [id number] Date of Birth/Sex: 06/06/1965 (52 y.o. M) Treating RN: Secundino Ginger Primary Care Ruvim Risko: [name] Other Clinician: Referring Amelita Risinger: [name] Treating Chanc Kervin/Extender: Melburn Hake, HOYT Weeks in Treatment: 4 Wound Status Wound Number: 5 Primary Pressure Ulcer Etiology: Wound Location: Left Lower Leg - Posterior, Proximal Wound Open Wounding Event: Pressure Injury Status: Date Acquired: 12/17/2017 Comorbid Myocardial Infarction, History of pressure Weeks Of Treatment: 2 History: wounds, Rheumatoid Arthritis, Paraplegia, Clustered Wound: No Confinement Anxiety Photos Photo Uploaded By: Secundino Ginger on 01/08/2018 16:07:12 Wound Measurements Length: (cm) 5 Width: (cm) 0.9 Depth: (cm) 0.1 Area: (cm) 3.534 Volume: (cm) 0.353 % Reduction in Area: -8.2% % Reduction in Volume: -8% Tunneling: No Undermining: No Wound Description Classification: Category/Stage III Exudate Amount: Small Exudate Type: Sanguinous Exudate Color: red Foul Odor After Cleansing: Yes Due to Product Use: No Slough/Fibrino No Wound Bed Granulation Amount: None Present (0%) Exposed Structure Necrotic Amount: Small (1-33%) Fascia Exposed: No Necrotic Quality: Eschar Fat Layer (Subcutaneous Tissue) Exposed: Yes Tendon Exposed: No Muscle Exposed: No Joint Exposed: No Bone Exposed: No Periwound Skin Texture Texture Color Neyman, Fabien B. (  [id number]) No Abnormalities Noted: No No Abnormalities Noted: No Callus: No Atrophie Blanche: No Crepitus:  No Cyanosis: No Excoriation: No Ecchymosis: No Induration: No Erythema: No Rash: No Hemosiderin Staining: No Scarring: No Mottled: No Pallor: No Moisture Rubor: No No Abnormalities Noted: No Dry / Scaly: No Maceration: No Wound Preparation Ulcer Cleansing: Rinsed/Irrigated with Saline, Wound Cleanser Topical Anesthetic Applied: Other: lidocaine 4%, Treatment Notes Wound #5 (Left, Proximal, Posterior Lower Leg) 1. Cleansed with: Clean wound with Normal Saline 2. Anesthetic Topical Lidocaine 4% cream to wound bed prior to debridement 4. Dressing Applied: Calcium Alginate with Silver 5. Secondary Dressing Applied ABD Pad Kerlix/Conform 7. Secured with Tape Notes Silvercell, kerlix and netting Electronic Signature(s) Signed: 01/08/2018 4:32:55 PM By: Secundino Ginger Entered By: Secundino Ginger on 01/08/2018 13:04:17 [name] ([id number]) -------------------------------------------------------------------------------- Wound Assessment Details Patient Name: [name] Date of Service: 01/08/2018 12:30 PM Medical Record Number: [id number] Patient Account Number: [id number] Date of Birth/Sex: 06/07/1965 (52 y.o. M) Treating RN: Secundino Ginger Primary Care Hargun Spurling: [name] Other Clinician: Referring Jaeleigh Monaco: [name] Treating Caraline Deutschman/Extender: Melburn Hake, HOYT Weeks in Treatment: 4 Wound Status Wound Number: 6 Primary Abrasion Etiology: Wound Location: Right Lower Leg - Posterior, Proximal Wound Open Wounding Event: Shear/Friction Status: Date Acquired: 01/05/2018 Comorbid Myocardial Infarction, History of pressure Weeks Of Treatment: 0 History: wounds, Rheumatoid Arthritis, Paraplegia, Clustered Wound: No Confinement Anxiety Photos Photo Uploaded By: Secundino Ginger on 01/08/2018 16:07:43 Wound Measurements Length: (cm) 0.3 Width: (cm) 0.3 Depth: (cm) 0.1 Area: (cm) 0.071 Volume: (cm) 0.007 % Reduction in Area: 0% % Reduction in Volume:  0% Wound Description Classification: Unclassifiable Exudate Amount: Small Exudate Type: Serosanguineous Exudate Color: red, brown Foul Odor After Cleansing: No Slough/Fibrino No Wound Bed Granulation Amount: None Present (0%) Exposed Structure Necrotic Amount: None Present (0%) Fascia Exposed: No Fat Layer (Subcutaneous Tissue) Exposed: No Tendon Exposed: No Muscle Exposed: No Joint Exposed: No Bone Exposed: No Periwound Skin Texture Texture Color Truett, Trystian B. ([id number]) No Abnormalities Noted: Yes No Abnormalities Noted: No Atrophie Blanche: No Moisture Cyanosis: No No Abnormalities Noted: No Ecchymosis: No Dry / Scaly: No Erythema: No Maceration: No Hemosiderin Staining: No Mottled: No Pallor: No Rubor: No Wound Preparation Ulcer Cleansing: Wound Cleanser Topical Anesthetic Applied: Other: lidocaine 4%, Treatment Notes Wound #6 (Right, Proximal, Posterior Lower Leg) 1. Cleansed with: Clean wound with Normal Saline 2. Anesthetic Topical Lidocaine 4% cream to wound bed prior to debridement 4. Dressing Applied: Calcium Alginate with Silver 5. Secondary Dressing Applied ABD Pad Kerlix/Conform 7. Secured with Tape Notes Silvercell, kerlix and netting Electronic Signature(s) Signed: 01/08/2018 4:32:55 PM By: Secundino Ginger Entered By: Secundino Ginger on 01/08/2018 13:17:24 [name] ([id number]) -------------------------------------------------------------------------------- [place] Details Patient Name: [name] Date of Service: 01/08/2018 12:30 PM Medical Record Number: [id number] Patient Account Number: [id number] Date of Birth/Sex: Dec 27, 1965 (52 y.o. M) Treating RN: Secundino Ginger Primary Care Marsden Zaino: [name] Other Clinician: Referring Yousaf Sainato: [name] Treating Rakhi Romagnoli/Extender: Melburn Hake, HOYT Weeks in Treatment: 4 Vital Signs Time Taken: 12:44 Temperature (F): 97.9 Height (in): 72 Pulse (bpm): 80 Respiratory Rate  (breaths/min): 16 Blood Pressure (mmHg): 98/64 Reference Range: 80 - 120 mg / dl Electronic Signature(s) Signed: 01/08/2018 4:32:55 PM By: Secundino Ginger Entered BySecundino Ginger on 01/08/2018 12:48:46

## 2018-01-13 NOTE — Progress Notes (Signed)
MAURY, GRONINGER ([id number]) Visit Report for 01/08/2018 Chief Complaint Document Details Patient Name: [name], [name] Date of Service: 01/08/2018 12:30 PM Medical Record Number: [id number] Patient Account Number: [id number] Date of Birth/Sex: 1966/03/30 (52 y.o. M) Treating RN: Montey Hora Primary Care Provider: [name] Other Clinician: Referring Provider: [name] Treating Provider/Extender: Melburn Hake, Donnalyn Juran Weeks in Treatment: 4 Information Obtained from: Patient Chief Complaint Bilateral Foot Ulcers Electronic Signature(s) Signed: 01/11/2018 1:32:29 PM By: Worthy Keeler PA-C Entered By: Worthy Keeler on 01/08/2018 13:02:14 [name] ([id number]) -------------------------------------------------------------------------------- HPI Details Patient Name: [name] Date of Service: 01/08/2018 12:30 PM Medical Record Number: [id number] Patient Account Number: [id number] Date of Birth/Sex: 15-Oct-1965 (52 y.o. M) Treating RN: Montey Hora Primary Care Provider: [name] Other Clinician: Referring Provider: [name] Treating Provider/Extender: Melburn Hake, Damek Ende Weeks in Treatment: 4 History of Present Illness Associated Signs and Symptoms: Patient has a history of confirmed osteomyelitis of the left foot according to MRI July 2019. 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 HPI Description: 12/08/17 on evaluation today patient presents for initial evaluation and our clinic concerning issues he has been having with his feet bilaterally although the left more than right most recently. He was admitted to hospital where he was placed on IV vancomycin which actually he continue to utilize until discharge and even when discharged continue to be on until around 16 August according to what he tells me. His PICC line was removed at that point. Nonetheless he has been seen in the wound  care center in [facility] before transferring to [place] due to the fact that the doctor there left and they are no longer open. Nonetheless he does have significant osteomyelitis of the left foot noted on MRI which revealed significant issues with necrotic bone including a portion of the distal region of his left great toe which was felt to possibly either be missing as a result of amputation or potentially resorption. Either way he also had osteomyelitis noted of the digit, metatarsal region, cuneiform, and navicular bones. There is definite bone exposure noted externally at this point in time. In regard to the right foot he has issues with ulcerations here as well although he states that recently he's had no issues until this just blistered and reopened. Apparently Xeroform was used in this has caused things to be somewhat more moist and open more according to the patient. He's otherwise been tolerating the dressing changes without complication using silver alginate dressings. He has no discomfort but does seem to be extremely stressed regarding the fact that he did see Dr. 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 The patient stated per notes reviewed that he did not want to have any surgery and therefore has a repeat appointment scheduled with the surgeon on 12/15/17. With all that being said he feels like that he really has not been alerted to what was going on in the severity that was until just recently and has a lot of questions in that regard. I'll be see I explained that seeing him for the first time today I cannot answer a lot of those questions that he has. 12/21/17 on evaluation today patient actually appears to be doing a little better in regard to the right foot in particular. There is one area where there still definitive bone noted although the MRI which I did review today that we had  ordered was negative for any evidence of osteomyelitis which is  good news. Nonetheless there was some necrotic bone noted. Overall the patient appears to be doing fairly well however in regard to the right foot. Unfortunately he does have a new open area on the posterior aspect of his left leg as well as the continued area of necrosis noted in the central portion of the foot. He states that he's actually going to be sent for reevaluation specifically to a limb salvage clinic at [facility] he believes this is something that is primary care provider has been working with. They were just awaiting the results of the MRI. 01/08/18 on evaluation today patient actually appears to be doing maybe just a little better in regard to the overall appearance of his bilateral foot ulcers. In general this does not seem to have worsened at least which is good news. He still has evidence of bone noted on the surface of both wound areas which though dry appearing really has not otherwise changed at this time. He actually has an appointment at [facility] with the wound center to discuss limb salvage. He discusses with me at the last visit. Nonetheless he was somewhat upset today about a couple things one being that he stated that we did not put the order in for the protective dressing for his knees that we discussed last time. With that being said I had put in the order for the protective dressing in fact it was on his order sheet that was signed on 12/21/17. Nonetheless unfortunately it appears this was overlooked by home health and they told him if they had the order they could put the protective dressing on. Nonetheless they told him they did not have the order. Again I believe this was an oversight nothing intentional on anyone's part nonetheless the order was there. Still I had explained to the patient sometimes insurance will not cover for the protective dressing even if it is ordered and this was discussed in the last visit. Electronic Signature(s) Signed: 01/11/2018 1:32:29 PM  By: Worthy Keeler PA-C Entered By: Worthy Keeler on 01/08/2018 13:53:07 [name] ([id number]) -------------------------------------------------------------------------------- Physical Exam Details Patient Name: [name] Date of Service: 01/08/2018 12:30 PM Medical Record Number: [id number] Patient Account Number: [id number] Date of Birth/Sex: 1965-05-16 (52 y.o. M) Treating RN: Montey Hora Primary Care Provider: [name] Other Clinician: Referring Provider: [name] Treating Provider/Extender: Melburn Hake, Waver Dibiasio Weeks in Treatment: 4 Constitutional Chronically ill appearing but in no apparent acute distress. Respiratory normal breathing without difficulty. clear to auscultation bilaterally. Cardiovascular regular rate and rhythm with normal S1, S2. Psychiatric this patient is able to make decisions and demonstrates good insight into disease process. Alert and Oriented x 3. pleasant and cooperative. Notes At this point patient's wounds really seem to be doing about the same there's no evidence of worsening although he still has dried been noted on the surface of the dorsal aspect of both feet. Otherwise his wounds in general appear to be doing a little better. Again I could not evaluate the wound on the gluteal region as we did not have the room open for him to go to where we could lay him down on the stretcher here in the office. For that reason we will have to schedule him for room five next time to be able to do this and that was discussed with patient. Electronic Signature(s) Signed: 01/11/2018 1:32:29 PM By: Worthy Keeler PA-C Entered By: Joaquim Lai  IIIMargarita [name] on 01/08/2018 13:56:59 [name], [name] ([id number]) -------------------------------------------------------------------------------- Physician Orders Details Patient Name: [name] Date of Service: 01/08/2018 12:30 PM Medical Record Number: [id number] Patient Account Number:  [id number] Date of Birth/Sex: April 28, 1965 (52 y.o. 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 Wound Cleansing Wound #1 Right,Dorsal Foot o Cleanse wound with mild soap and water Wound #2 Left,Dorsal Foot o Cleanse wound with mild soap and water Wound #4 Left,Distal,Posterior Lower Leg o Cleanse wound with mild soap and water Wound #5 Left,Proximal,Posterior Lower Leg o Cleanse wound with mild soap and water Wound #6 Right,Proximal,Posterior Lower Leg o Cleanse wound with mild soap and water Primary Wound Dressing Wound #1 Right,Dorsal Foot o Silver Alginate Wound #2 Left,Dorsal Foot o Silver Alginate Wound #4 Left,Distal,Posterior Lower Leg o Silver Alginate Wound #5 Left,Proximal,Posterior Lower Leg o Silver Alginate Wound #6 Right,Proximal,Posterior Lower Leg o Silver Alginate [name], Colin B. ([id number]) Secondary Dressing Wound #1 Right,Dorsal Foot o Gauze, ABD and Kerlix/Conform Wound #2 Left,Dorsal Foot o Gauze, ABD and Kerlix/Conform Wound #4 Left,Distal,Posterior Lower Leg o Gauze, ABD and Kerlix/Conform Wound #5 Left,Proximal,Posterior Lower Leg o Gauze, ABD and Kerlix/Conform Wound #6 Right,Proximal,Posterior Lower Leg o Gauze, ABD and Kerlix/Conform Dressing Change Frequency Wound #1 Right,Dorsal Foot o Change Dressing Monday, Wednesday, Friday Wound #2 Left,Dorsal  Foot o Change Dressing Monday, Wednesday, Friday Wound #4 Left,Distal,Posterior Lower Leg o Change Dressing Monday, Wednesday, Friday Wound #5 Left,Proximal,Posterior Lower Leg o Change Dressing Monday, Wednesday, Friday Wound #6 Right,Proximal,Posterior Lower Leg o Change Dressing Monday, Wednesday, Friday Follow-up Appointments Wound #1 Right,Dorsal Foot o Return Appointment in 2 weeks. Wound #2 Left,Dorsal Foot o Return Appointment in 2 weeks. Wound #4 Left,Distal,Posterior Lower Leg o Return Appointment in 2 weeks. Wound #5 Left,Proximal,Posterior Lower Leg o Return Appointment in 2 weeks. Wound #6 Right,Proximal,Posterior Lower Leg o Return Appointment in 2 weeks. Off-Loading Wound #1 Right,Dorsal Foot o Turn and reposition every 2 hours Wound #2 Left,Dorsal Foot o Turn and reposition every 2 hours Wound #4 Left,Distal,Posterior Lower Leg [place], Han B. ([id number]) o Turn and reposition every 2 hours Wound #5 Left,Proximal,Posterior Lower Leg o Turn and reposition every 2 hours Wound #6 Right,Proximal,Posterior Lower Leg o Turn and reposition every 2 hours Additional Orders / Instructions Wound #1 Right,Dorsal Foot o Increase protein intake. Wound #2 Left,Dorsal Foot o Increase protein intake. Wound #4 Left,Distal,Posterior Lower Leg o Increase protein intake. Wound #5 Left,Proximal,Posterior Lower Leg o Increase protein intake. Wound #6 Right,Proximal,Posterior Lower Leg o Increase protein intake. Home Health Wound #1 [place] Visits o Home Health Nurse may visit PRN to address patientos wound care needs. o FACE TO FACE ENCOUNTER: MEDICARE and MEDICAID PATIENTS: I certify that this patient is under my care and that I had a face-to-face encounter that meets the physician face-to-face encounter requirements with this patient on this date. The encounter with the patient was in whole  or in part for the following MEDICAL CONDITION: (primary reason for [place]) MEDICAL NECESSITY: I certify, that based on my findings, NURSING  services are a medically necessary home health service. HOME BOUND STATUS: I certify that my clinical findings support that this patient is homebound (i.e., Due to illness or injury, pt requires aid of supportive devices such as crutches, cane, wheelchairs, walkers, the use of special transportation or the assistance of another person to leave their place of residence. There is a normal inability to leave the home and doing so requires considerable and taxing effort. Other absences are for medical reasons / religious services and are infrequent or of short duration when for other reasons). o If current dressing causes regression in wound condition, may D/C ordered dressing product/s and apply Normal Saline Moist Dressing daily until next [place] / Other MD appointment. [place] of regression in wound condition at [phone]. o Please direct any NON-WOUND related issues/requests for orders to patient's Primary Care Physician Wound #2 [place] Visits o Home Health Nurse may visit PRN to address patientos wound care needs. o FACE TO FACE ENCOUNTER: MEDICARE and MEDICAID PATIENTS: I certify that this patient is under my care and that I had a face-to-face encounter that meets the physician face-to-face encounter requirements with this patient on this date. The encounter with the patient was in whole or in part for the following MEDICAL CONDITION: (primary reason for [place]) MEDICAL NECESSITY: I certify, that based on my findings, NURSING services are a medically necessary home health service. HOME BOUND STATUS: I certify that my clinical findings support that this patient is homebound (i.e., Due to illness or injury, pt requires aid of supportive devices such as  crutches, cane, wheelchairs, walkers, the use of special transportation or the assistance of another person to leave their place of residence. There is a normal inability to leave the home and doing so requires considerable and taxing effort. Other absences are for medical reasons / religious services and are infrequent or of short duration when for other reasons). [place], Sergei B. ([id number]) o If current dressing causes regression in wound condition, may D/C ordered dressing product/s and apply Normal Saline Moist Dressing daily until next [place] / Other MD appointment. [place] of regression in wound condition at [phone]. o Please direct any NON-WOUND related issues/requests for orders to patient's Primary Care Physician Wound #4 [place] Nurse may visit PRN to address patientos wound care needs. o FACE TO FACE ENCOUNTER: MEDICARE and MEDICAID PATIENTS: I certify that this patient is under my care and that I had a face-to-face encounter that meets the physician face-to-face encounter requirements with this patient on this date. The encounter with the patient was in whole or in part for the following MEDICAL CONDITION: (primary reason for [place]) MEDICAL NECESSITY: I certify, that based on my findings, NURSING services are a medically necessary home health service. HOME BOUND STATUS: I certify that my clinical findings support that this patient is homebound (i.e., Due to illness or injury, pt requires aid of supportive devices such as crutches, cane, wheelchairs, walkers, the use of special transportation or the assistance of another person to leave their place of residence. There is a normal inability to leave the home and doing so requires considerable and taxing effort. Other absences are for medical reasons / religious services and are infrequent or of short  duration when for other reasons). o If current dressing causes regression in wound condition, may D/C ordered dressing product/s and apply  Normal Saline Moist Dressing daily until next [place] / Other MD appointment. [place] of regression in wound condition at [phone]. o Please direct any NON-WOUND related issues/requests for orders to patient's Primary Care Physician Wound #5 Left,Proximal,Posterior Lower Leg o [place] Nurse may visit PRN to address patientos wound care needs. o FACE TO FACE ENCOUNTER: MEDICARE and MEDICAID PATIENTS: I certify that this patient is under my care and that I had a face-to-face encounter that meets the physician face-to-face encounter requirements with this patient on this date. The encounter with the patient was in whole or in part for the following MEDICAL CONDITION: (primary reason for [place]) MEDICAL NECESSITY: I certify, that based on my findings, NURSING services are a medically necessary home health service. HOME BOUND STATUS: I certify that my clinical findings support that this patient is homebound (i.e., Due to illness or injury, pt requires aid of supportive devices such as crutches, cane, wheelchairs, walkers, the use of special transportation or the assistance of another person to leave their place of residence. There is a normal inability to leave the home and doing so requires considerable and taxing effort. Other absences are for medical reasons / religious services and are infrequent or of short duration when for other reasons). o If current dressing causes regression in wound condition, may D/C ordered dressing product/s and apply Normal Saline Moist Dressing daily until next [place] / Other MD appointment. [place] of regression in wound condition at [phone]. o Please direct any NON-WOUND related issues/requests for  orders to patient's Primary Care Physician Wound #6 Right,Proximal,Posterior Lower Leg o [place] Nurse may visit PRN to address patientos wound care needs. o FACE TO FACE ENCOUNTER: MEDICARE and MEDICAID PATIENTS: I certify that this patient is under my care and that I had a face-to-face encounter that meets the physician face-to-face encounter requirements with this patient on this date. The encounter with the patient was in whole or in part for the following MEDICAL CONDITION: (primary reason for [place]) MEDICAL NECESSITY: I certify, that based on my findings, NURSING services are a medically necessary home health service. HOME BOUND STATUS: I certify that my clinical findings support that this patient is homebound (i.e., Due to illness or injury, pt requires aid of supportive devices such as crutches, cane, wheelchairs, walkers, the use of special transportation or the assistance of another person to leave their place of residence. There is a normal inability to leave the home and doing so requires considerable and taxing effort. Other absences are for medical reasons / religious services and are infrequent or of short duration when for other reasons). o If current dressing causes regression in wound condition, may D/C ordered dressing product/s and apply Normal Saline Moist Dressing daily until next [place] / Other MD appointment. [place] of regression in wound condition at [phone]. o Please direct any NON-WOUND related issues/requests for orders to patient's Primary Care Physician MUZAMMIL, [name] ([id number]) Notes HHRN may apply bordered foam dressings on patient's bottom and knees if there is a wound or the need for protection in that area [facility] was unable to check patient's bottom for wounds R/T patient being on a stretcher during this visit Electronic Signature(s) Signed: 01/08/2018  5:05:51 PM By: Montey Hora Signed: 01/11/2018 1:32:29 PM By: Worthy Keeler PA-C Entered By: Montey Hora on 01/08/2018 13:52:31 [name],  Colin B. ([id number]) -------------------------------------------------------------------------------- Problem List Details Patient Name: [name], [name] Date of Service: 01/08/2018 12:30 PM Medical Record Number: [id number] Patient Account Number: [id number] Date of Birth/Sex: 09/05/1965 (52 y.o. M) Treating RN: Montey Hora Primary Care Provider: [name] Other Clinician: Referring Provider: [name] Treating Provider/Extender: Melburn Hake, Sherece Gambrill Weeks in Treatment: 4 Active Problems ICD-10 Evaluated Encounter Code Description Active Date Today Diagnosis [phone] Other acute osteomyelitis, left ankle and foot 12/08/2017 No Yes L97.524 Non-pressure chronic ulcer of other part of left foot with 12/08/2017 No Yes necrosis of bone L97.512 Non-pressure chronic ulcer of other part of right foot with fat 12/08/2017 No Yes layer exposed G82.54 Quadriplegia, C5-C7 incomplete 12/08/2017 No Yes R63.6 Underweight 12/08/2017 No Yes E44.0 Moderate protein-calorie malnutrition 12/08/2017 No Yes 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 M) Treating RN: Montey Hora Primary Care Provider: [name] Other Clinician: Referring Provider: [name] Treating Provider/Extender: Melburn Hake, Piotr Christopher Weeks in Treatment: 4 Subjective Chief Complaint Information obtained from  Patient Bilateral Foot Ulcers History of Present Illness (HPI) The following HPI elements were documented for the patient's wound: Associated Signs and Symptoms: Patient has a history of confirmed osteomyelitis of the left foot according to MRI July 2019. 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 12/08/17 on evaluation today patient presents for initial evaluation and our clinic concerning issues he has been having with his feet bilaterally although the left more than right most recently. He was admitted to hospital where he was placed on IV vancomycin which actually he continue to utilize until discharge and even when discharged continue to be on until around 16 August according to what he tells me. His PICC line was removed at that point. Nonetheless he has been seen in the wound care center in [facility] before transferring to [place] due to the fact that the doctor there left and they are no longer open. Nonetheless he does have significant osteomyelitis of the left foot noted on MRI which revealed significant issues with necrotic bone including a portion of the distal region of his left great toe which was felt to possibly either be missing as a result of amputation or potentially resorption. Either way he also had osteomyelitis noted of the digit, metatarsal region, cuneiform, and navicular bones. There is definite bone exposure noted externally at this point in time. In regard to the right foot he has issues with ulcerations here as well although he states that recently he's had no issues until this just blistered and reopened. Apparently Xeroform was used in this has caused things to be somewhat more moist and open more according to the patient. He's otherwise been tolerating the dressing changes without complication using silver alginate dressings. He has no discomfort but does seem to be extremely stressed regarding the  fact that he did see Dr. 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 The patient stated per notes reviewed that he did not want to have any surgery and therefore has a repeat appointment scheduled with the surgeon on 12/15/17. With all that being said he feels like that he really  has not been alerted to what was going on in the severity that was until just recently and has a lot of questions in that regard. I'll be see I explained that seeing him for the first time today I cannot answer a lot of those questions that he has. 12/21/17 on evaluation today patient actually appears to be doing a little better in regard to the right foot in particular. There is one area where there still definitive bone noted although the MRI which I did review today that we had ordered was negative for any evidence of osteomyelitis which is good news. Nonetheless there was some necrotic bone noted. Overall the patient appears to be doing fairly well however in regard to the right foot. Unfortunately he does have a new open area on the posterior aspect of his left leg as well as the continued area of necrosis noted in the central portion of the foot. He states that he's actually going to be sent for reevaluation specifically to a limb salvage clinic at [facility] he believes this is something that is primary care provider has been working with. They were just awaiting the results of the MRI. 01/08/18 on evaluation today patient actually appears to be doing maybe just a little better in regard to the overall appearance of his bilateral foot ulcers. In general this does not seem to have worsened at least which is good news. He still has evidence of bone noted on the surface of both wound areas which though dry appearing really has not otherwise changed at this time. He actually has an appointment at [facility] with the wound center to discuss limb salvage. He discusses with me at the last  visit. Nonetheless he was somewhat upset today about a couple things one being that he stated that we did not put the order in for the protective dressing for his knees that we discussed last time. With that being said I had put in the order for the protective dressing in fact it was on his order sheet that was signed on 12/21/17. Nonetheless unfortunately it appears this was overlooked by home health and they told him if they had the order they could put the protective dressing on. Nonetheless they told him they did not have the order. Again I believe this was an oversight nothing intentional on anyone's part nonetheless the order was there. Still I had explained to the patient sometimes insurance will not cover for the protective dressing even if it is ordered and this was discussed in the last visit. PAWLOSKI, Colin B. ([id number]) Patient History Information obtained from Patient. Family History Cancer - Siblings,Mother, Diabetes - Siblings, Heart Disease - Father, Hypertension - Father, Kidney Disease - Siblings, No family history of Lung Disease, Seizures, Stroke, Thyroid Problems, Tuberculosis. Social History Never smoker, Marital Status - Separated, Alcohol Use - Never, Drug Use - No History, Caffeine Use - Moderate. Review of Systems (ROS) Constitutional Symptoms (General Health) Denies complaints or symptoms of Fever, Chills. Respiratory The patient has no complaints or symptoms. Cardiovascular The patient has no complaints or symptoms. Psychiatric The patient has no complaints or symptoms. Objective Constitutional Chronically ill appearing but in no apparent acute distress. Vitals Time Taken: 12:44 PM, Height: 72 in, Temperature: 97.9 F, Pulse: 80 bpm, Respiratory Rate: 16 breaths/min, Blood Pressure: 98/64 mmHg. Respiratory normal breathing without difficulty. clear to auscultation bilaterally. Cardiovascular regular rate and rhythm with normal S1,  S2. Psychiatric this patient is able to make decisions and demonstrates  good insight into disease process. Alert and Oriented x 3. pleasant and cooperative. General Notes: At this point patient's wounds really seem to be doing about the same there's no evidence of worsening although he still has dried been noted on the surface of the dorsal aspect of both feet. Otherwise his wounds in general appear to be doing a little better. Again I could not evaluate the wound on the gluteal region as we did not have the room open for him to go to where we could lay him down on the stretcher here in the office. For that reason we will have to schedule him for room five next time to be able to do this and that was discussed with patient. Integumentary (Hair, Skin) Wound #1 status is Open. Original cause of wound was Gradually Appeared. The wound is located on the Right,Dorsal Foot. The wound measures 0.1cm length x 0.1cm width x 0.1cm depth; 0.008cm^2 area and 0.001cm^3 volume. There is bone and Carbonneau, Kavaughn B. ([id number]) Fat Layer (Subcutaneous Tissue) Exposed exposed. There is no tunneling or undermining noted. There is a large amount of sanguinous drainage noted. Foul odor after cleansing was noted. The wound margin is indistinct and nonvisible. There is no granulation within the wound bed. There is a large (67-100%) amount of necrotic tissue within the wound bed including Eschar. The periwound skin appearance exhibited: Scarring, Erythema. The periwound skin appearance did not exhibit: Dry/Scaly, Maceration. The surrounding wound skin color is noted with erythema which is circumferential. Periwound temperature was noted as No Abnormality. Wound #2 status is Open. Original cause of wound was Gradually Appeared. The wound is located on the Left,Dorsal Foot. The wound measures 4.4cm length x 1.9cm width x 0.3cm depth; 6.566cm^2 area and 1.97cm^3 volume. There is bone and Fat Layer (Subcutaneous  Tissue) Exposed exposed. There is no tunneling or undermining noted. There is a none present amount of drainage noted. Foul odor after cleansing was noted. The wound margin is indistinct and nonvisible. There is no granulation within the wound bed. There is a large (67-100%) amount of necrotic tissue within the wound bed including Eschar. The periwound skin appearance exhibited: Scarring, Dry/Scaly. The periwound skin appearance did not exhibit: Callus, Crepitus, Excoriation, Induration, Rash, Maceration, Atrophie Blanche, Cyanosis, Ecchymosis, Hemosiderin Staining, Mottled, Pallor, Rubor, Erythema. Periwound temperature was noted as No Abnormality. Wound #4 status is Open. Original cause of wound was Pressure Injury. The wound is located on the Left,Distal,Posterior Lower Leg. The wound measures 1.2cm length x 0.5cm width x 0.1cm depth; 0.471cm^2 area and 0.047cm^3 volume. There is Fat Layer (Subcutaneous Tissue) Exposed exposed. There is no tunneling or undermining noted. There is a small amount of sanguinous drainage noted. Foul odor after cleansing was noted. There is large (67-100%) red, pink granulation within the wound bed. There is no necrotic tissue within the wound bed. The periwound skin appearance exhibited: Scarring. The periwound skin appearance did not exhibit: Callus, Crepitus, Excoriation, Induration, Rash, Atrophie Blanche, Cyanosis, Ecchymosis, Hemosiderin Staining, Mottled, Pallor, Rubor, Erythema. Wound #5 status is Open. Original cause of wound was Pressure Injury. The wound is located on the Left,Proximal,Posterior Lower Leg. The wound measures 5cm length x 0.9cm width x 0.1cm depth; 3.534cm^2 area and 0.353cm^3 volume. There is Fat Layer (Subcutaneous Tissue) Exposed exposed. There is no tunneling or undermining noted. There is a small amount of sanguinous drainage noted. Foul odor after cleansing was noted. There is no granulation within the wound bed. There is a small  (1-33%) amount of necrotic  tissue within the wound bed including Eschar. The periwound skin appearance did not exhibit: Callus, Crepitus, Excoriation, Induration, Rash, Scarring, Dry/Scaly, Maceration, Atrophie Blanche, Cyanosis, Ecchymosis, Hemosiderin Staining, Mottled, Pallor, Rubor, Erythema. Wound #6 status is Open. Original cause of wound was Shear/Friction. The wound is located on the Right,Proximal,Posterior Lower Leg. The wound measures 0.3cm length x 0.3cm width x 0.1cm depth; 0.071cm^2 area and 0.007cm^3 volume. There is a small amount of serosanguineous drainage noted. There is no granulation within the wound bed. There is no necrotic tissue within the wound bed. The periwound skin appearance had no abnormalities noted for texture. The periwound skin appearance did not exhibit: Dry/Scaly, Maceration, Atrophie Blanche, Cyanosis, Ecchymosis, Hemosiderin Staining, Mottled, Pallor, Rubor, Erythema. 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 ([id number]) Plan Wound Cleansing: Wound #1 Right,Dorsal Foot: Cleanse wound with mild soap and water Wound #2 Left,Dorsal Foot: Cleanse wound with mild soap and water Wound #4 Left,Distal,Posterior Lower Leg: Cleanse wound with mild soap and water Wound #5 Left,Proximal,Posterior Lower Leg: Cleanse wound with mild soap and water Wound #6 Right,Proximal,Posterior Lower Leg: Cleanse wound with mild soap and water Primary Wound Dressing: Wound #1 Right,Dorsal Foot: Silver Alg[place] Wound #2 Left,Dorsal Foot: Silver Alginate Wound #4 Left,Distal,Posterior Lower Leg: Silver Alginate Wound #5 Left,Proximal,Posterior Lower  Leg: Silver Alginate Wound #6 Right,Proximal,Posterior Lower Leg: Silver Alginate Secondary Dressing: Wound #1 Right,Dorsal Foot: Gauze, ABD and Kerlix/Conform Wound #2 Left,Dorsal Foot: Gauze, ABD and Kerlix/Conform Wound #4 Left,Distal,Posterior Lower Leg: Gauze, ABD and Kerlix/Conform Wound #5 Left,Proximal,Posterior Lower Leg: Gauze, ABD and Kerlix/Conform Wound #6 Right,Proximal,Posterior Lower Leg: Gauze, ABD and Kerlix/Conform Dressing Change Frequency: Wound #1 Right,Dorsal Foot: Change Dressing Monday, Wednesday, Friday Wound #2 Left,Dorsal Foot: Change Dressing Monday, Wednesday, Friday Wound #4 Left,Distal,Posterior Lower Leg: Change Dressing Monday, Wednesday, Friday Wound #5 Left,Proximal,Posterior Lower Leg: Change Dressing Monday, Wednesday, Friday Wound #6 Right,Proximal,Posterior Lower Leg: Change Dressing Monday, Wednesday, Friday Follow-up Appointments: Wound #1 Right,Dorsal Foot: Return Appointment in 2 weeks. Wound #2 Left,Dorsal Foot: Return Appointment in 2 weeks. Wound #4 Left,Distal,Posterior Lower Leg: Return Appointment in 2 weeks. Wound #5 Left,Proximal,Posterior Lower Leg: Return Appointment in 2 weeks. Wound #6 Right,Proximal,Posterior Lower Leg: Return Appointment in 2 weeks. Off-Loading: [name], [name] ([id number]) Wound #1 Right,Dorsal Foot: Turn and reposition every 2 hours Wound #2 Left,Dorsal Foot: Turn and reposition every 2 hours Wound #4 Left,Distal,Posterior Lower Leg: Turn and reposition every 2 hours Wound #5 Left,Proximal,Posterior Lower Leg: Turn and reposition every 2 hours Wound #6 Right,Proximal,Posterior Lower Leg: Turn and reposition every 2 hours Additional Orders / Instructions: Wound #1 Right,Dorsal Foot: Increase protein intake. Wound #2 Left,Dorsal Foot: Increase protein intake. Wound #4 Left,Distal,Posterior Lower Leg: Increase protein intake. Wound #5 Left,Proximal,Posterior Lower Leg: Increase  protein intake. Wound #6 Right,Proximal,Posterior Lower Leg: Increase protein intake. Home Health: Wound #1 Right,Dorsal Foot: [place] Nurse may visit PRN to address patient s wound care needs. FACE TO FACE ENCOUNTER: MEDICARE and MEDICAID PATIENTS: I certify that this patient is under my care and that I had a face-to-face encounter that meets the physician face-to-face encounter requirements with this patient on this date. The encounter with the patient was in whole or in part for the following MEDICAL CONDITION: (primary reason for [place]) MEDICAL  NECESSITY: I certify, that based on my findings, NURSING services are a medically necessary home health service. HOME BOUND STATUS: I certify that my clinical findings support that this patient is homebound (i.e., Due to illness or injury, pt requires aid of supportive devices such as crutches, cane, wheelchairs, walkers, the use of special transportation or the assistance of another person to leave their place of residence. There is a normal inability to leave the home and doing so requires considerable and taxing effort. Other absences are for medical reasons / religious services and are infrequent or of short duration when for other reasons). If current dressing causes regression in wound condition, may D/C ordered dressing product/s and apply Normal Saline Moist Dressing daily until next [place] / Other MD appointment. [place] of regression in wound condition at [phone]. Please direct any NON-WOUND related issues/requests for orders to patient's Primary Care Physician Wound #2 Left,Dorsal Foot: [place] Nurse may visit PRN to address patient s wound care needs. FACE TO FACE ENCOUNTER: MEDICARE and MEDICAID PATIENTS: I certify that this patient is under my care and that I had a face-to-face encounter that meets the physician  face-to-face encounter requirements with this patient on this date. The encounter with the patient was in whole or in part for the following MEDICAL CONDITION: (primary reason for [place]) MEDICAL NECESSITY: I certify, that based on my findings, NURSING services are a medically necessary home health service. HOME BOUND STATUS: I certify that my clinical findings support that this patient is homebound (i.e., Due to illness or injury, pt requires aid of supportive devices such as crutches, cane, wheelchairs, walkers, the use of special transportation or the assistance of another person to leave their place of residence. There is a normal inability to leave the home and doing so requires considerable and taxing effort. Other absences are for medical reasons / religious services and are infrequent or of short duration when for other reasons). If current dressing causes regression in wound condition, may D/C ordered dressing product/s and apply Normal Saline Moist Dressing daily until next [place] / Other MD appointment. [place] of regression in wound condition at [phone]. Please direct any NON-WOUND related issues/requests for orders to patient's Primary Care Physician Wound #4 Left,Distal,Posterior Lower Leg: [place] Nurse may visit PRN to address patient s wound care needs. FACE TO FACE ENCOUNTER: MEDICARE and MEDICAID PATIENTS: I certify that this patient is under my care and that I had a face-to-face encounter that meets the physician face-to-face encounter requirements with this patient on this date. The encounter with the patient was in whole or in part for the following MEDICAL CONDITION: (primary reason for Home Miler, Colin B. ([id number]) Healthcare) MEDICAL NECESSITY: I certify, that based on my findings, NURSING services are a medically necessary home health service. HOME BOUND STATUS: I certify that my  clinical findings support that this patient is homebound (i.e., Due to illness or injury, pt requires aid of supportive devices such as crutches, cane, wheelchairs, walkers, the use of special transportation or the assistance of another person to leave their place of residence. There is a normal inability to leave the home and doing so requires considerable and taxing effort. Other absences are for medical reasons / religious services and are infrequent or of short duration when for other reasons). If current dressing causes regression in wound condition, may D/C ordered dressing product/s and apply  Normal Saline Moist Dressing daily until next [place] / Other MD appointment. [place] of regression in wound condition at [phone]. Please direct any NON-WOUND related issues/requests for orders to patient's Primary Care Physician Wound #5 Left,Proximal,Posterior Lower Leg: [place] Nurse may visit PRN to address patient s wound care needs. FACE TO FACE ENCOUNTER: MEDICARE and MEDICAID PATIENTS: I certify that this patient is under my care and that I had a face-to-face encounter that meets the physician face-to-face encounter requirements with this patient on this date. The encounter with the patient was in whole or in part for the following MEDICAL CONDITION: (primary reason for [place]) MEDICAL NECESSITY: I certify, that based on my findings, NURSING services are a medically necessary home health service. HOME BOUND STATUS: I certify that my clinical findings support that this patient is homebound (i.e., Due to illness or injury, pt requires aid of supportive devices such as crutches, cane, wheelchairs, walkers, the use of special transportation or the assistance of another person to leave their place of residence. There is a normal inability to leave the home and doing so requires considerable and taxing effort. Other  absences are for medical reasons / religious services and are infrequent or of short duration when for other reasons). If current dressing causes regression in wound condition, may D/C ordered dressing product/s and apply Normal Saline Moist Dressing daily until next [place] / Other MD appointment. [place] of regression in wound condition at [phone]. Please direct any NON-WOUND related issues/requests for orders to patient's Primary Care Physician Wound #6 Right,Proximal,Posterior Lower Leg: [place] Nurse may visit PRN to address patient s wound care needs. FACE TO FACE ENCOUNTER: MEDICARE and MEDICAID PATIENTS: I certify that this patient is under my care and that I had a face-to-face encounter that meets the physician face-to-face encounter requirements with this patient on this date. The encounter with the patient was in whole or in part for the following MEDICAL CONDITION: (primary reason for [place]) MEDICAL NECESSITY: I certify, that based on my findings, NURSING services are a medically necessary home health service. HOME BOUND STATUS: I certify that my clinical findings support that this patient is homebound (i.e., Due to illness or injury, pt requires aid of supportive devices such as crutches, cane, wheelchairs, walkers, the use of special transportation or the assistance of another person to leave their place of residence. There is a normal inability to leave the home and doing so requires considerable and taxing effort. Other absences are for medical reasons / religious services and are infrequent or of short duration when for other reasons). If current dressing causes regression in wound condition, may D/C ordered dressing product/s and apply Normal Saline Moist Dressing daily until next [place] / Other MD appointment. [place] of regression in wound condition at  [phone]. Please direct any NON-WOUND related issues/requests for orders to patient's Primary Care Physician General Notes: HHRN may apply bordered foam dressings on patient's bottom and knees if there is a wound or the need for protection in that area [facility] was unable to check patient's bottom for wounds R/T patient being on a stretcher during this visit I'm gonna suggest currently we continue with the above wound care measures for the next week. We'll see about getting I'm help to provide protective dressing for his needs as well as a protective dressing for his gluteal  region until he sees you and see wound care. He's going there to discuss limb salvage as far as the bilateral lower extremities. We will subsequently see him back for reevaluation in roughly 2-3 weeks time opinion on whether or not he is still with you and see. If he is still with them and he will cancel the appointment with US obviously. Please see above for specific wound care orders. We will see patient for re-evaluation in 2 week(s) here in the clinic. If anything worsens or changes patient will contact our office for additional recommendations. [name], [name] ([id number]) Electronic Signature(s) Signed: 01/11/2018 1:32:29 PM By: Worthy Keeler PA-C Entered By: Worthy Keeler on 01/08/2018 13:57:47 [name] ([id number]) -------------------------------------------------------------------------------- ROS/PFSH Details Patient Name: [name] Date of Service: 01/08/2018 12:30 PM Medical Record Number: [id number] Patient Account Number: [id number] Date of Birth/Sex: [birth year] (52 y.o. M) Treating RN: Montey Hora Primary Care Provider: [name] Other Clinician: Referring Provider: [name] Treating Provider/Extender: Melburn Hake, Lj Miyamoto Weeks in Treatment: 4 Information Obtained From Patient Wound History Do you currently have one or more open woundso Yes How many open  wounds do you currently haveo 3 Constitutional Symptoms (General Health) Complaints and Symptoms: Negative for: Fever; Chills Eyes Medical History: Negative for: Cataracts; Glaucoma; Optic Neuritis Ear/Nose/Mouth/Throat Medical History: Negative for: Chronic sinus problems/congestion; Middle ear problems Hematologic/Lymphatic Medical History: Negative for: Anemia; Human Immunodeficiency Virus; Lymphedema; Sickle Cell Disease Respiratory Complaints and Symptoms: No Complaints or Symptoms Medical History: Negative for: Aspiration; Asthma; Chronic Obstructive Pulmonary Disease (COPD); Pneumothorax; Sleep Apnea; Tuberculosis Cardiovascular Complaints and Symptoms: No Complaints or Symptoms Medical History: Positive for: Myocardial Infarction Negative for: Angina; Arrhythmia; Congestive Heart Failure; Coronary Artery Disease; Deep Vein Thrombosis; Hypertension; Hypotension; Peripheral Arterial Disease; Peripheral Venous Disease; Phlebitis; Vasculitis Gastrointestinal Medical History: Negative for: Cirrhosis ; Colitis; Crohnos; Hepatitis A; Hepatitis B; Hepatitis C Strike, Jule B. ([id number]) Endocrine Medical History: Negative for: Type I Diabetes; Type II Diabetes Genitourinary Medical History: Negative for: End Stage Renal Disease Immunological Medical History: Negative for: Lupus Erythematosus; Raynaudos; Scleroderma Integumentary (Skin) Medical History: Positive for: History of pressure wounds Negative for: History of Burn Musculoskeletal Medical History: Positive for: Rheumatoid Arthritis Negative for: Osteoarthritis; Osteomyelitis Neurologic Medical History: Positive for: Paraplegia - neck down paralysis Negative for: Dementia; Neuropathy; Quadriplegia; Seizure Disorder Psychiatric Complaints and Symptoms: No Complaints or Symptoms Medical History: Positive for: Confinement Anxiety Negative for: Anorexia/bulimia Immunizations Pneumococcal  Vaccine: Received Pneumococcal Vaccination: Yes Implantable Devices Family and Social History Cancer: Yes - Siblings,Mother; Diabetes: Yes - Siblings; Heart Disease: Yes - Father; Hypertension: Yes - Father; Kidney Disease: Yes - Siblings; Lung Disease: No; Seizures: No; Stroke: No; Thyroid Problems: No; Tuberculosis: No; Never smoker; Marital Status - Separated; Alcohol Use: Never; Drug Use: No History; Caffeine Use: Moderate; Financial Concerns: No; Food, Clothing or Shelter Needs: No; Support System Lacking: No; Transportation Concerns: No Physician Affirmation I have reviewed and agree with the above information. Electronic Signature(s) Signed: 01/08/2018 5:05:51 PM By: Orlando Penner, Oleg B. ([id number]) Signed: 01/11/2018 1:32:29 PM By: Worthy Keeler PA-C Entered By: Worthy Keeler on 01/08/2018 13:55:34 [name] ([id number]) -------------------------------------------------------------------------------- SuperBill Details Patient Name: [name] Date of Service: 01/08/2018 Medical Record Number: [id number] Patient Account Number: [id number] Date of Birth/Sex: 1965-08-12 (52 y.o. M) Treating RN: Montey Hora Primary Care Provider: [name] Other Clinician: Referring Provider: [name] Treating Provider/Extender: Melburn Hake, Ledon Weihe Weeks in Treatment: 4 Diagnosis Coding ICD-10 Codes Code Description [phone] Other acute  osteomyelitis, left ankle and foot L97.524 Non-pressure chronic ulcer of other part of left foot with necrosis of bone L97.512 Non-pressure chronic ulcer of other part of right foot with fat layer exposed G82.54 Quadriplegia, C5-C7 incomplete R63.6 Underweight E44.0 Moderate protein-calorie malnutrition Z93.1 Gastrostomy status Physician Procedures CPT4 Code: [id number] Description: 65790 - WC PHYS LEVEL 3 - EST PT ICD-10 Diagnosis Description M86.172 Other acute osteomyelitis, left ankle and foot L97.524 Non-pressure  chronic ulcer of other part of left foot with necr L97.512 Non-pressure chronic ulcer of other part of  right foot with fat G82.54 Quadriplegia, C5-C7 incomplete Modifier: osis of bone layer exposed Quantity: 1 Electronic Signature(s) Signed: 01/11/2018 1:32:29 PM By: Worthy Keeler PA-C Entered By: Worthy Keeler on 01/08/2018 13:58:05

## 2018-01-25 ENCOUNTER — Encounter: Payer: Medicare Other | Admitting: Physician Assistant

## 2018-01-25 DIAGNOSIS — L97512 Non-pressure chronic ulcer of other part of right foot with fat layer exposed: Secondary | ICD-10-CM | POA: Diagnosis not present

## 2018-01-30 NOTE — Progress Notes (Signed)
[name], [name] ([id number]) Visit Report for 01/25/2018 Chief Complaint Document Details Patient Name: [name] Date of Service: 01/25/2018 1:30 PM Medical Record Number: [id number] Patient Account Number: [id number] Date of Birth/Sex: February 10, 1966 (52 y.o. M) Treating RN: Cornell Barman Primary Care Provider: [name] Other Clinician: Referring Provider: [name] Treating Provider/Extender: Melburn Hake, Adonai Helzer Weeks in Treatment: 6 Information Obtained from: Patient Chief Complaint Bilateral Foot Ulcers Electronic Signature(s) Signed: 01/29/2018 8:35:57 AM By: Worthy Keeler PA-C Entered By: Worthy Keeler on 01/25/2018 14:00:36 [name] ([id number]) -------------------------------------------------------------------------------- HPI Details Patient Name: [name] Date of Service: 01/25/2018 1:30 PM Medical Record Number: [id number] Patient Account Number: [id number] Date of Birth/Sex: 1965-05-11 (52 y.o. M) Treating RN: Cornell Barman Primary Care Provider: [name] Other Clinician: Referring Provider: [name] Treating Provider/Extender: Melburn Hake, Ivor Kishi Weeks in Treatment: 6 History of Present Illness Associated Signs and Symptoms: Patient has a history of confirmed osteomyelitis of the left foot according to MRI July 2019. 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 HPI Description: 12/08/17 on evaluation today patient presents for initial evaluation and our clinic concerning issues he has been having with his feet bilaterally although the left more than right most recently. He was admitted to hospital where he was placed on IV vancomycin which actually he continue to utilize until discharge and even when discharged continue to be on until around 16 August according to what he tells me. His PICC line was removed at that point. Nonetheless he has been seen in the wound care  center in [facility] before transferring to [place] due to the fact that the doctor there left and they are no longer open. Nonetheless he does have significant osteomyelitis of the left foot noted on MRI which revealed significant issues with necrotic bone including a portion of the distal region of his left great toe which was felt to possibly either be missing as a result of amputation or potentially resorption. Either way he also had osteomyelitis noted of the digit, metatarsal region, cuneiform, and navicular bones. There is definite bone exposure noted externally at this point in time. In regard to the right foot he has issues with ulcerations here as well although he states that recently he's had no issues until this just blistered and reopened. Apparently Xeroform was used in this has caused things to be somewhat more moist and open more according to the patient. He's otherwise been tolerating the dressing changes without complication using silver alginate dressings. He has no discomfort but does seem to be extremely stressed regarding the fact that he did see Dr. 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 The patient stated per notes reviewed that he did not want to have any surgery and therefore has a repeat appointment scheduled with the surgeon on 12/15/17. With all that being said he feels like that he really has not been alerted to what was going on in the severity that was until just recently and has a lot of questions in that regard. I'll be see I explained that seeing him for the first time today I cannot answer a lot of those questions that he has. 12/21/17 on evaluation today patient actually appears to be doing a little better in regard to the right foot in particular. There is one area where there still definitive bone noted although the MRI which I did review today that we had  ordered was negative for any evidence of osteomyelitis which is good  news. Nonetheless there was some necrotic bone noted. Overall the patient appears to be doing fairly well however in regard to the right foot. Unfortunately he does have a new open area on the posterior aspect of his left leg as well as the continued area of necrosis noted in the central portion of the foot. He states that he's actually going to be sent for reevaluation specifically to a limb salvage clinic at [facility] he believes this is something that is primary care provider has been working with. They were just awaiting the results of the MRI. 01/08/18 on evaluation today patient actually appears to be doing maybe just a little better in regard to the overall appearance of his bilateral foot ulcers. In general this does not seem to have worsened at least which is good news. He still has evidence of bone noted on the surface of both wound areas which though dry appearing really has not otherwise changed at this time. He actually has an appointment at [facility] with the wound center to discuss limb salvage. He discusses with me at the last visit. Nonetheless he was somewhat upset today about a couple things one being that he stated that we did not put the order in for the protective dressing for his knees that we discussed last time. With that being said I had put in the order for the protective dressing in fact it was on his order sheet that was signed on 12/21/17. Nonetheless unfortunately it appears this was overlooked by home health and they told him if they had the order they could put the protective dressing on. Nonetheless they told him they did not have the order. Again I believe this was an oversight nothing intentional on anyone's part nonetheless the order was there. Still I had explained to the patient sometimes insurance will not cover for the protective dressing even if it is ordered and this was discussed in the last visit. 01/25/18 on evaluation today patient presents for follow-up  concerning his bilateral lower extremity ulcers. He has actually been doing about the same since I last saw him. We do have them in the room we can look at his gluteal region today as well to ensure that there is nothing open or if so address the issue there. Nonetheless he was supposed to see you and see wound care/limb salvage prior to his visit with me today. Unfortunately when he arrived last time at their clinic he apparently was there on the wrong day the doctor was not even present that he was his to be seen. Nonetheless I had to be switched to [facility], Jatinder B. ([id number]) a different day and the patient actually is going in the next week to see them. Assuming everything works out this should actually be tomorrow. Nonetheless if they take over care of that point then we will subsequently release care to them. Electronic Signature(s) Signed: 01/29/2018 8:35:57 AM By: Worthy Keeler PA-C Entered By: Worthy Keeler on 01/28/2018 10:15:17 [name] ([id number]) -------------------------------------------------------------------------------- Physical Exam Details Patient Name: [name] Date of Service: 01/25/2018 1:30 PM Medical Record Number: [id number] Patient Account Number: [id number] Date of Birth/Sex: 02-Apr-1966 (52 y.o. M) Treating RN: Cornell Barman Primary Care Provider: [name] Other Clinician: Referring Provider: [name] Treating Provider/Extender: Melburn Hake, Anandi Abramo Weeks in Treatment: 6 Constitutional Chronically ill appearing but in no apparent acute distress. Respiratory normal breathing without difficulty. clear  to auscultation bilaterally. Cardiovascular regular rate and rhythm with normal S1, S2. Psychiatric this patient is able to make decisions and demonstrates good insight into disease process. Alert and Oriented x 3. pleasant and cooperative. Notes Patient's wound beds currently really appear to be no different in regard to his  feet I still see necrotic bone noted bilaterally which is exposed. Again there does not appear to be evidence of osteomyelitis based on MRI of the right foot. 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 The patient seems to be potentially coming to terms with the fact that if you Hayden Pedro may be necessary at least on the left possibly on the right as well. Other than the main wounds on the dorsal surface of this feet remaining areas actually appear to be fairly superficial and doing well. Electronic Signature(s) Signed: 01/29/2018 8:35:57 AM By: Worthy Keeler PA-C Entered By: Worthy Keeler on 01/28/2018 10:16:08 [name] ([id number]) -------------------------------------------------------------------------------- Physician Orders Details Patient Name: [name] Date of Service: 01/25/2018 1:30 PM Medical Record Number: [id number] Patient Account Number: [id number] Date of Birth/Sex: [birth year] (52 y.o. M) Treating RN: Cornell Barman Primary Care Provider: [name] Other Clinician: Referring Provider: [name] Treating Provider/Extender: Melburn Hake, Mariadelosang Wynns Weeks in Treatment: 6 Verbal / Phone Orders: No Diagnosis Coding ICD-10 Coding Code Description [phone] Other acute osteomyelitis, left ankle and foot L97.524 Non-pressure chronic ulcer of other part of left foot with necrosis of bone L97.512 Non-pressure chronic ulcer of other part of right foot with fat layer exposed G82.54 Quadriplegia, C5-C7 incomplete R63.6 Underweight E44.0 Moderate protein-calorie malnutrition 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 Wound #2 Left,Dorsal Foot o Topical Lidocaine 4% cream applied to wound bed prior to debridement (In Clinic Only). Wound #4 Left,Distal,Posterior Lower Leg o Topical Lidocaine 4% cream applied to wound bed prior to debridement (In Clinic Only). Wound #5 Left,Proximal,Posterior Lower Leg o Topical Lidocaine 4% cream applied to wound bed prior to debridement (In Clinic Only). Wound #6 Right,Proximal,Posterior Lower Leg o Topical Lidocaine 4% cream applied to wound bed prior to debridement (In Clinic Only). LADEN, FIELDHOUSE ([id number]) Skin Barriers/Peri-Wound Care o Barrier cream - Right Gluteus Primary Wound Dressing Wound #1 Right,Dorsal Foot o Mepitel One Contact layer - Under SIlver Alginate o Silver Alginate - Silver Cell Over Mepitel Wound #2 Left,Dorsal Foot o Mepitel One Contact layer - Under SIlver Alginate o Silver Alginate - Silver Cell Over Mepitel Wound #4 Left,Distal,Posterior Lower Leg o Mepitel One Contact layer - Under SIlver Alginate o Silver Alginate - Silver Cell Over Mepitel Wound #5 Left,Proximal,Posterior Lower Leg o Mepitel One Contact layer - Under SIlver Alginate o Silver Alginate - Silver Cell Over Mepitel Wound #6 Right,Proximal,Posterior Lower Leg o Mepitel One Contact layer - Under SIlver Alginate o Silver Alginate - Silver Cell Over Mepitel Secondary Dressing Wound #1 Right,Dorsal Foot o ABD and Kerlix/Conform Wound #2 Left,Dorsal Foot o ABD and Kerlix/Conform Wound #4 Left,Distal,Posterior Lower Leg   o ABD and Kerlix/Conform Wound #5 Left,Proximal,Posterior Lower Leg o ABD and Kerlix/Conform Wound #6 Right,Proximal,Posterior Lower Leg o ABD and  Kerlix/Conform Dressing Change Frequency Wound #1 Right,Dorsal Foot o Change dressing every other day. Wound #2 Left,Dorsal Foot o Change dressing every other day. Wound #4 Left,Distal,Posterior Lower Leg o Change dressing every other day. Wound #5 Left,Proximal,Posterior Lower Leg o Change dressing every other day. Wound #6 Right,Proximal,Posterior Lower Leg o Change dressing every other day. Follow-up Appointments [place], [place] B. ([id number]) Wound #1 Right,Dorsal Foot o Return Appointment in 2 weeks. Wound #2 Left,Dorsal Foot o Return Appointment in 2 weeks. Wound #4 Left,Distal,Posterior Lower Leg o Return Appointment in 2 weeks. Wound #5 Left,Proximal,Posterior Lower Leg o Return Appointment in 2 weeks. Wound #6 Right,Proximal,Posterior Lower Leg o Return Appointment in 2 weeks. Off-Loading o Turn and reposition every 2 hours - Shear and friction beginning on right gluteus. Home Health Wound #1 [place] Visits o Home Health Nurse may visit PRN to address patientos wound care needs. o FACE TO FACE ENCOUNTER: MEDICARE and MEDICAID PATIENTS: I certify that this patient is under my care and that I had a face-to-face encounter that meets the physician face-to-face encounter requirements with this patient on this date. The encounter with the patient was in whole or in part for the following MEDICAL CONDITION: (primary reason for [place]) MEDICAL NECESSITY: I certify, that based on my findings, NURSING services are a medically necessary home health service. HOME BOUND STATUS: I certify that my clinical findings support that this patient is homebound (i.e., Due to illness or injury, pt requires aid of supportive devices such as crutches, cane, wheelchairs, walkers, the use of special transportation or the assistance of another person to leave their place of residence. There is a normal inability to leave the  home and doing so requires considerable and taxing effort. Other absences are for medical reasons / religious services and are infrequent or of short duration when for other reasons). o If current dressing causes regression in wound condition, may D/C ordered dressing product/s and apply Normal Saline Moist Dressing daily until next [place] / Other MD appointment. [place] of regression in wound condition at [phone]. o Please direct any NON-WOUND related issues/requests for orders to patient's Primary Care Physician Wound #2 [place] Visits o Home Health Nurse may visit PRN to address patientos wound care needs. o FACE TO FACE ENCOUNTER: MEDICARE and MEDICAID PATIENTS: I certify that this patient is under my care and that I had a face-to-face encounter that meets the physician face-to-face encounter requirements with this patient on this date. The encounter with the patient was in whole or in part for the following MEDICAL CONDITION: (primary reason for [place]) MEDICAL NECESSITY: I certify, that based on my findings, NURSING services are a medically necessary home health service. HOME BOUND STATUS: I certify that my clinical findings support that this patient is homebound (i.e., Due to illness or injury, pt requires aid of supportive devices such as crutches, cane, wheelchairs, walkers, the use of special transportation or the assistance of another person to leave their place of residence. There is a normal inability to leave the home and doing so requires considerable and taxing effort. Other absences are for medical reasons / religious services and are infrequent or of short duration when for other reasons). o If current dressing causes regression in wound condition, may D/C ordered dressing product/s and apply  Normal Saline Moist Dressing daily until next [place] / Other MD appointment.  [place] of regression in wound condition at [phone]. o Please direct any NON-WOUND related issues/requests for orders to patient's Primary Care Physician Wound #4 [employer] Nurse may visit PRN to address patientos wound care needs. ORENTHAL, DEBSKI ([id number]) o FACE TO FACE ENCOUNTER: MEDICARE and MEDICAID PATIENTS: I certify that this patient is under my care and that I had a face-to-face encounter that meets the physician face-to-face encounter requirements with this patient on this date. The encounter with the patient was in whole or in part for the following MEDICAL CONDITION: (primary reason for [place]) MEDICAL NECESSITY: I certify, that based on my findings, NURSING services are a medically necessary home health service. HOME BOUND STATUS: I certify that my clinical findings support that this patient is homebound (i.e., Due to illness or injury, pt requires aid of supportive devices such as crutches, cane, wheelchairs, walkers, the use of special transportation or the assistance of another person to leave their place of residence. There is a normal inability to leave the home and doing so requires considerable and taxing effort. Other absences are for medical reasons / religious services and are infrequent or of short duration when for other reasons). o If current dressing causes regression in wound condition, may D/C ordered dressing product/s and apply Normal Saline Moist Dressing daily until next [place] / Other MD appointment. [place] of regression in wound condition at [phone]. o Please direct any NON-WOUND related issues/requests for orders to patient's Primary Care Physician Wound #5 Left,Proximal,Posterior Lower Leg o [place] Nurse may visit PRN to address patientos wound care needs. o  FACE TO FACE ENCOUNTER: MEDICARE and MEDICAID PATIENTS: I certify that this patient is under my care and that I had a face-to-face encounter that meets the physician face-to-face encounter requirements with this patient on this date. The encounter with the patient was in whole or in part for the following MEDICAL CONDITION: (primary reason for [place]) MEDICAL NECESSITY: I certify, that based on my findings, NURSING services are a medically necessary home health service. HOME BOUND STATUS: I certify that my clinical findings support that this patient is homebound (i.e., Due to illness or injury, pt requires aid of supportive devices such as crutches, cane, wheelchairs, walkers, the use of special transportation or the assistance of another person to leave their place of residence. There is a normal inability to leave the home and doing so requires considerable and taxing effort. Other absences are for medical reasons / religious services and are infrequent or of short duration when for other reasons). o If current dressing causes regression in wound condition, may D/C ordered dressing product/s and apply Normal Saline Moist Dressing daily until next [place] / Other MD appointment. [place] of regression in wound condition at [phone]. o Please direct any NON-WOUND related issues/requests for orders to patient's Primary Care Physician Wound #6 Right,Proximal,Posterior Lower Leg o [place] Nurse may visit PRN to address patientos wound care needs. o FACE TO FACE ENCOUNTER: MEDICARE and MEDICAID PATIENTS: I certify that this patient is under my care and that I had a face-to-face encounter that meets the physician face-to-face encounter requirements with this patient on this date. The encounter with the patient was in whole or in  part for the following MEDICAL CONDITION: (primary reason for Home Healthcare) MEDICAL  NECESSITY: I certify, that based on my findings, NURSING services are a medically necessary home health service. HOME BOUND STATUS: I certify that my clinical findings support that this patient is homebound (i.e., Due to illness or injury, pt requires aid of supportive devices such as crutches, cane, wheelchairs, walkers, the use of special transportation or the assistance of another person to leave their place of residence. There is a normal inability to leave the home and doing so requires considerable and taxing effort. Other absences are for medical reasons / religious services and are infrequent or of short duration when for other reasons). o If current dressing causes regression in wound condition, may D/C ordered dressing product/s and apply Normal Saline Moist Dressing daily until next [place] / Other MD appointment. [place] of regression in wound condition at [phone]. o Please direct any NON-WOUND related issues/requests for orders to patient's Primary Care Physician Electronic Signature(s) Signed: 01/25/2018 6:05:50 PM By: Gretta Cool, BSN, RN, CWS, Kim RN, BSN Signed: 01/29/2018 8:35:57 AM By: Worthy Keeler PA-C Entered By: Gretta Cool, BSN, RN, CWS, Kim on 01/25/2018 14:43:35 [name] ([id number] Milian, Claudia Pollock ([id number]) -------------------------------------------------------------------------------- Problem List Details Patient Name: [name] Date of Service: 01/25/2018 1:30 PM Medical Record Number: [id number] Patient Account Number: [id number] Date of Birth/Sex: 25-Apr-1965 (52 y.o. M) Treating RN: Cornell Barman Primary Care Provider: [name] Other Clinician: Referring Provider: [name] Treating Provider/Extender: Melburn Hake, Rayaan Lorah Weeks in Treatment: 6 Active Problems ICD-10 Evaluated Encounter Code Description Active Date Today Diagnosis [phone] Other acute osteomyelitis, left ankle and foot 12/08/2017  No Yes L97.524 Non-pressure chronic ulcer of other part of left foot with 12/08/2017 No Yes necrosis of bone L97.512 Non-pressure chronic ulcer of other part of right foot with fat 12/08/2017 No Yes layer exposed G82.54 Quadriplegia, C5-C7 incomplete 12/08/2017 No Yes R63.6 Underweight 12/08/2017 No Yes E44.0 Moderate protein-calorie malnutrition 12/08/2017 No Yes 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 M) Treating RN: Cornell Barman Primary Care Provider: [name] Other Clinician: Referring Provider: [name] Treating Provider/Extender: Melburn Hake, Kaicee Scarpino Weeks in Treatment: 6 Subjective Chief Complaint Information obtained from Patient Bilateral Foot Ulcers History of Present Illness (HPI) The following HPI elements were documented for the patient's wound: Associated Signs and Symptoms: Patient has a history of confirmed osteomyelitis of the left foot according to MRI July 2019. 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 12/08/17 on evaluation today patient presents for initial evaluation and our clinic concerning issues he has been having with his feet bilaterally although the left more than right most recently. He was admitted to hospital where he was placed on IV vancomycin which actually he continue to utilize until discharge and even when discharged continue to be on until around 16 August according to what he tells me. His PICC line was removed  at that point. Nonetheless he has been seen in the wound care center in [facility] before transferring to [place] due to the fact that the doctor there left and they are no  longer open. Nonetheless he does have significant osteomyelitis of the left foot noted on MRI which revealed significant issues with necrotic bone including a portion of the distal region of his left great toe which was felt to possibly either be missing as a result of amputation or potentially resorption. Either way he also had osteomyelitis noted of the digit, metatarsal region, cuneiform, and navicular bones. There is definite bone exposure noted externally at this point in time. In regard to the right foot he has issues with ulcerations here as well although he states that recently he's had no issues until this just blistered and reopened. Apparently Xeroform was used in this has caused things to be somewhat more moist and open more according to the patient. He's otherwise been tolerating the dressing changes without complication using silver alginate dressings. He has no discomfort but does seem to be extremely stressed regarding the fact that he did see Dr. 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 The patient stated per notes reviewed that he did not want to have any surgery and therefore has a repeat appointment scheduled with the surgeon on 12/15/17. With all that being said he feels like that he really has not been alerted to what was going on in the severity that was until just recently and has a lot of questions in that regard. I'll be see I explained that seeing him for the first time today I cannot answer a lot of those questions that he has. 12/21/17 on evaluation today patient actually appears to be doing a little better in regard to the right foot in particular. There is one area where there still definitive bone noted although the MRI which I did review today that we had ordered  was negative for any evidence of osteomyelitis which is good news. Nonetheless there was some necrotic bone noted. Overall the patient appears to be doing fairly well however in regard to the right foot. Unfortunately he does have a new open area on the posterior aspect of his left leg as well as the continued area of necrosis noted in the central portion of the foot. He states that he's actually going to be sent for reevaluation specifically to a limb salvage clinic at [facility] he believes this is something that is primary care provider has been working with. They were just awaiting the results of the MRI. 01/08/18 on evaluation today patient actually appears to be doing maybe just a little better in regard to the overall appearance of his bilateral foot ulcers. In general this does not seem to have worsened at least which is good news. He still has evidence of bone noted on the surface of both wound areas which though dry appearing really has not otherwise changed at this time. He actually has an appointment at [facility] with the wound center to discuss limb salvage. He discusses with me at the last visit. Nonetheless he was somewhat upset today about a couple things one being that he stated that we did not put the order in for the protective dressing for his knees that we discussed last time. With that being said I had put in the order for the protective dressing in fact it was on his order sheet that was signed on 12/21/17. Nonetheless unfortunately it appears this was overlooked by home health and they told him if they had the order they could put the protective dressing on. Nonetheless they told him they did not have  the order. Again I believe this was an oversight nothing intentional on anyone's part nonetheless the order was there. Still I had explained to the patient sometimes insurance will not cover for the protective dressing even if it is ordered and this was discussed in the last  visit. SANDIP, POWER ([id number]) 01/25/18 on evaluation today patient presents for follow-up concerning his bilateral lower extremity ulcers. He has actually been doing about the same since I last saw him. We do have them in the room we can look at his gluteal region today as well to ensure that there is nothing open or if so address the issue there. Nonetheless he was supposed to see you and see wound care/limb salvage prior to his visit with me today. Unfortunately when he arrived last time at their clinic he apparently was there on the wrong day the doctor was not even present that he was his to be seen. Nonetheless I had to be switched to a different day and the patient actually is going in the next week to see them. Assuming everything works out this should actually be tomorrow. Nonetheless if they take over care of that point then we will subsequently release care to them. Patient History Information obtained from Patient. Family History Cancer - Siblings,Mother, Diabetes - Siblings, Heart Disease - Father, Hypertension - Father, Kidney Disease - Siblings, No family history of Lung Disease, Seizures, Stroke, Thyroid Problems, Tuberculosis. Social History Never smoker, Marital Status - Separated, Alcohol Use - Never, Drug Use - No History, Caffeine Use - Moderate. Review of Systems (ROS) Constitutional Symptoms (General Health) Denies complaints or symptoms of Fever, Chills. Respiratory The patient has no complaints or symptoms. Cardiovascular The patient has no complaints or symptoms. Psychiatric The patient has no complaints or symptoms. Objective Constitutional Chronically ill appearing but in no apparent acute distress. Vitals Time Taken: 1:42 PM, Height: 72 in, Temperature: 98.6 F, Pulse: 68 bpm, Respiratory Rate: 16 breaths/min, Blood Pressure: 148/88 mmHg. Respiratory normal breathing without difficulty. clear to auscultation bilaterally. Cardiovascular regular  rate and rhythm with normal S1, S2. Psychiatric this patient is able to make decisions and demonstrates good insight into disease process. Alert and Oriented x 3. pleasant and cooperative. General Notes: Patient's wound beds currently really appear to be no different in regard to his feet I still see necrotic bone Schwager, Keante B. ([id number]) noted bilaterally which is exposed. Again there does not appear to be evidence of osteomyelitis based on MRI of the right foot. 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 The patient seems to be potentially coming to terms with the fact that if you Hayden Pedro may be necessary at least on the left possibly on the right as well. Other than the main wounds on the dorsal surface of this feet remaining areas actually appear to be fairly superficial and doing well. Integumentary (Hair, Skin) Wound #1 status is Open. Original cause of wound was Gradually Appeared. The wound is located on the Right,Dorsal Foot. The wound measures 0.1cm length x 0.1cm width x 0.1cm depth; 0.008cm^2 area and 0.001cm^3 volume. There is bone and Fat Layer (Subcutaneous Tissue) Exposed exposed. There is no tunneling or undermining noted. There is a none present amount of drainage noted. Foul odor after cleansing was noted. The wound margin is indistinct and nonvisible. There is no granulation within the wound bed. There is no necrotic tissue within the wound bed. The periwound skin appearance exhibited: Scarring. The periwound skin  appearance did not exhibit: Dry/Scaly, Maceration, Erythema. Periwound temperature was noted as No Abnormality. Wound #2 status is Open. Original cause of wound was Gradually Appeared. The wound is located on the Left,Dorsal Foot. The wound measures 4.4cm length x 2cm width x 0.2cm depth; 6.912cm^2 area and 1.382cm^3 volume. There is bone and Fat Layer (Subcutaneous Tissue) Exposed exposed. There is no  tunneling or undermining noted. There is a small amount of serosanguineous drainage noted. Foul odor after cleansing was noted. The wound margin is indistinct and nonvisible. There is no granulation within the wound bed. There is a large (67-100%) amount of necrotic tissue within the wound bed including Eschar. The periwound skin appearance exhibited: Scarring, Dry/Scaly. The periwound skin appearance did not exhibit: Callus, Crepitus, Excoriation, Induration, Rash, Maceration, Atrophie Blanche, Cyanosis, Ecchymosis, Hemosiderin Staining, Mottled, Pallor, Rubor, Erythema. Periwound temperature was noted as No Abnormality. Wound #4 status is Open. Original cause of wound was Pressure Injury. The wound is located on the Left,Distal,Posterior Lower Leg. The wound measures 5.5cm length x 2cm width x 0.1cm depth; 8.639cm^2 area and 0.864cm^3 volume. There is Fat Layer (Subcutaneous Tissue) Exposed exposed. There is no tunneling or undermining noted. There is a small amount of sanguinous drainage noted. Foul odor after cleansing was noted. There is small (1-33%) red, pink granulation within the wound bed. There is no necrotic tissue within the wound bed. The periwound skin appearance exhibited: Scarring. The periwound skin appearance did not exhibit: Callus, Crepitus, Excoriation, Induration, Rash, Atrophie Blanche, Cyanosis, Ecchymosis, Hemosiderin Staining, Mottled, Pallor, Rubor, Erythema. Wound #5 status is Open. Original cause of wound was Pressure Injury. The wound is located on the Left,Proximal,Posterior Lower Leg. The wound measures 1.5cm length x 1cm width x 0.1cm depth; 1.178cm^2 area and 0.118cm^3 volume. There is Fat Layer (Subcutaneous Tissue) Exposed exposed. There is no tunneling or undermining noted. There is a small amount of sanguinous drainage noted. Foul odor after cleansing was noted. There is no granulation within the wound bed. There is no necrotic tissue within the wound bed. The  periwound skin appearance did not exhibit: Callus, Crepitus, Excoriation, Induration, Rash, Scarring, Dry/Scaly, Maceration, Atrophie Blanche, Cyanosis, Ecchymosis, Hemosiderin Staining, Mottled, Pallor, Rubor, Erythema. Wound #6 status is Open. Original cause of wound was Shear/Friction. The wound is located on the Right,Proximal,Posterior Lower Leg. The wound measures 0.5cm length x 0.5cm width x 0.1cm depth; 0.196cm^2 area and 0.02cm^3 volume. There is a small amount of serous drainage noted. There is no granulation within the wound bed. There is no necrotic tissue within the wound bed. The periwound skin appearance had no abnormalities noted for texture. The periwound skin appearance did not exhibit: Dry/Scaly, Maceration, Atrophie Blanche, Cyanosis, Ecchymosis, Hemosiderin Staining, Mottled, Pallor, Rubor, Erythema. Other Condition(s) Patient presents with Other Dermatologic Condition located on the Right Gluteus. The skin appearance had no abnormalities noted for: Texture. The skin appearance did not exhibit: Atrophie Blanche, Callus, Crepitus, Cyanosis, Dry/Scaly, Ecchymosis, Erythema, Excoriation, Friable, Hemosiderin Staining, Induration, Maceration, Mottled, Pallor, Rash, Rubor, Scarring. Assessment Kapusta, Tanor B. 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, C5-C7 incomplete Underweight Moderate protein-calorie malnutrition Gastrostomy status Plan Wound Cleansing: Wound #1 Right,Dorsal Foot: Cleanse wound with mild soap and water Wound #2 Left,Dorsal Foot: Cleanse wound with mild soap and water Wound #4 Left,Distal,Posterior Lower Leg: Cleanse wound with mild soap and water  Wound #5 Left,Proximal,Posterior Lower Leg: Cleanse wound with mild soap and water Wound  #6 Right,Proximal,Posterior Lower Leg: Cleanse wound with mild soap and water Anesthetic (add to Medication List): Wound #1 Right,Dorsal Foot: Topical Lidocaine 4% cream applied to wound bed prior to debridement (In Clinic Only). Wound #2 Left,Dorsal Foot: Topical Lidocaine 4% cream applied to wound bed prior to debridement (In Clinic Only). Wound #4 Left,Distal,Posterior Lower Leg: Topical Lidocaine 4% cream applied to wound bed prior to debridement (In Clinic Only). Wound #5 Left,Proximal,Posterior Lower Leg: Topical Lidocaine 4% cream applied to wound bed prior to debridement (In Clinic Only). Wound #6 Right,Proximal,Posterior Lower Leg: Topical Lidocaine 4% cream applied to wound bed prior to debridement (In Clinic Only). Skin Barriers/Peri-Wound Care: Barrier cream - Right Gluteus Primary Wound Dressing: Wound #1 Right,Dorsal Foot: Mepitel One Contact layer - Under SIlver Alginate Silver Alginate - Silver Cell Over Mepitel Wound #2 Left,Dorsal Foot: Mepitel One Contact layer - Under SIlver Alginate Silver Alginate - Silver Cell Over Mepitel Wound #4 Left,Distal,Posterior Lower Leg: Mepitel One Contact layer - Under SIlver Alginate Silver Alginate - Silver Cell Over Mepitel Wound #5 Left,Proximal,Posterior Lower Leg: Mepitel One Contact layer - Under SIlver Alginate Silver Alginate - Silver Cell Over Mepitel Wound #6 Right,Proximal,Posterior Lower Leg: Mepitel One Contact layer - Under SIlver Alginate Silver Alginate - Silver Cell Over Mepitel Secondary Dressing: MICK, TANGUMA B. ([id number]) Wound #1 Right,Dorsal Foot: ABD and Kerlix/Conform Wound #2 Left,Dorsal Foot: ABD and Kerlix/Conform Wound #4 Left,Distal,Posterior Lower Leg: ABD and Kerlix/Conform Wound #5 Left,Proximal,Posterior Lower Leg: ABD and Kerlix/Conform Wound #6 Right,Proximal,Posterior Lower Leg: ABD and Kerlix/Conform Dressing Change Frequency: Wound #1 Right,Dorsal Foot: Change dressing every  other day. Wound #2 Left,Dorsal Foot: Change dressing every other day. Wound #4 Left,Distal,Posterior Lower Leg: Change dressing every other day. Wound #5 Left,Proximal,Posterior Lower Leg: Change dressing every other day. Wound #6 Right,Proximal,Posterior Lower Leg: Change dressing every other day. Follow-up Appointments: Wound #1 Right,Dorsal Foot: Return Appointment in 2 weeks. Wound #2 Left,Dorsal Foot: Return Appointment in 2 weeks. Wound #4 Left,Distal,Posterior Lower Leg: Return Appointment in 2 weeks. Wound #5 Left,Proximal,Posterior Lower Leg: Return Appointment in 2 weeks. Wound #6 Right,Proximal,Posterior Lower Leg: Return Appointment in 2 weeks. Off-Loading: Turn and reposition every 2 hours - Shear and friction beginning on right gluteus. Home Health: Wound #1 Right,Dorsal Foot: [place] Nurse may visit PRN to address patient s wound care needs. FACE TO FACE ENCOUNTER: MEDICARE and MEDICAID PATIENTS: I certify that this patient is under my care and that I had a face-to-face encounter that meets the physician face-to-face encounter requirements with this patient on this date. The encounter with the patient was in whole or in part for the following MEDICAL CONDITION: (primary reason for [place]) MEDICAL NECESSITY: I certify, that based on my findings, NURSING services are a medically necessary home health service. HOME BOUND STATUS: I certify that my clinical findings support that this patient is homebound (i.e., Due to illness or injury, pt requires aid of supportive devices such as crutches, cane, wheelchairs, walkers, the use of special transportation or the assistance of another person to leave their place of residence. There is a normal inability to leave the home and doing so requires considerable and taxing effort. Other absences are for medical reasons / religious services and are infrequent or of short duration when for  other reasons). If current dressing causes regression in wound condition, may D/C ordered dressing product/s and apply Normal Saline Moist Dressing daily  until next [place] / Other MD appointment. [place] of regression in wound condition at [phone]. Please direct any NON-WOUND related issues/requests for orders to patient's Primary Care Physician Wound #2 Left,Dorsal Foot: [place] Nurse may visit PRN to address patient s wound care needs. FACE TO FACE ENCOUNTER: MEDICARE and MEDICAID PATIENTS: I certify that this patient is under my care and that I had a face-to-face encounter that meets the physician face-to-face encounter requirements with this patient on this date. The encounter with the patient was in whole or in part for the following MEDICAL CONDITION: (primary reason for [place]) MEDICAL NECESSITY: I certify, that based on my findings, NURSING services are a medically necessary home health service. HOME BOUND STATUS: I certify that my clinical findings support that this patient is homebound (i.e., Due to illness or injury, pt requires aid of supportive devices such as crutches, cane, wheelchairs, walkers, the use of special Blackwell, Quamir B. ([id number]) transportation or the assistance of another person to leave their place of residence. There is a normal inability to leave the home and doing so requires considerable and taxing effort. Other absences are for medical reasons / religious services and are infrequent or of short duration when for other reasons). If current dressing causes regression in wound condition, may D/C ordered dressing product/s and apply Normal Saline Moist Dressing daily until next [place] / Other MD appointment. [place] of regression in wound condition at [phone]. Please direct any NON-WOUND related issues/requests for orders to patient's Primary  Care Physician Wound #4 Left,Distal,Posterior Lower Leg: [place] Nurse may visit PRN to address patient s wound care needs. FACE TO FACE ENCOUNTER: MEDICARE and MEDICAID PATIENTS: I certify that this patient is under my care and that I had a face-to-face encounter that meets the physician face-to-face encounter requirements with this patient on this date. The encounter with the patient was in whole or in part for the following MEDICAL CONDITION: (primary reason for [place]) MEDICAL NECESSITY: I certify, that based on my findings, NURSING services are a medically necessary home health service. HOME BOUND STATUS: I certify that my clinical findings support that this patient is homebound (i.e., Due to illness or injury, pt requires aid of supportive devices such as crutches, cane, wheelchairs, walkers, the use of special transportation or the assistance of another person to leave their place of residence. There is a normal inability to leave the home and doing so requires considerable and taxing effort. Other absences are for medical reasons / religious services and are infrequent or of short duration when for other reasons). If current dressing causes regression in wound condition, may D/C ordered dressing product/s and apply Normal Saline Moist Dressing daily until next [place] / Other MD appointment. [place] of regression in wound condition at [phone]. Please direct any NON-WOUND related issues/requests for orders to patient's Primary Care Physician Wound #5 Left,Proximal,Posterior Lower Leg: [place] Nurse may visit PRN to address patient s wound care needs. FACE TO FACE ENCOUNTER: MEDICARE and MEDICAID PATIENTS: I certify that this patient is under my care and that I had a face-to-face encounter that meets the physician face-to-face encounter requirements with this patient on this date.  The encounter with the patient was in whole or in part for the following MEDICAL CONDITION: (primary reason for [place]) MEDICAL NECESSITY: I certify, that based  on my findings, NURSING services are a medically necessary home health service. HOME BOUND STATUS: I certify that my clinical findings support that this patient is homebound (i.e., Due to illness or injury, pt requires aid of supportive devices such as crutches, cane, wheelchairs, walkers, the use of special transportation or the assistance of another person to leave their place of residence. There is a normal inability to leave the home and doing so requires considerable and taxing effort. Other absences are for medical reasons / religious services and are infrequent or of short duration when for other reasons). If current dressing causes regression in wound condition, may D/C ordered dressing product/s and apply Normal Saline Moist Dressing daily until next [place] / Other MD appointment. [place] of regression in wound condition at [phone]. Please direct any NON-WOUND related issues/requests for orders to patient's Primary Care Physician Wound #6 Right,Proximal,Posterior Lower Leg: [place] Nurse may visit PRN to address patient s wound care needs. FACE TO FACE ENCOUNTER: MEDICARE and MEDICAID PATIENTS: I certify that this patient is under my care and that I had a face-to-face encounter that meets the physician face-to-face encounter requirements with this patient on this date. The encounter with the patient was in whole or in part for the following MEDICAL CONDITION: (primary reason for [place]) MEDICAL NECESSITY: I certify, that based on my findings, NURSING services are a medically necessary home health service. HOME BOUND STATUS: I certify that my clinical findings support that this patient is homebound (i.e., Due to illness or injury, pt requires  aid of supportive devices such as crutches, cane, wheelchairs, walkers, the use of special transportation or the assistance of another person to leave their place of residence. There is a normal inability to leave the home and doing so requires considerable and taxing effort. Other absences are for medical reasons / religious services and are infrequent or of short duration when for other reasons). If current dressing causes regression in wound condition, may D/C ordered dressing product/s and apply Normal Saline Moist Dressing daily until next [place] / Other MD appointment. [place] of regression in wound condition at [phone]. Please direct any NON-WOUND related issues/requests for orders to patient's Primary Care Physician [place], Yaman B. ([id number]) Yetta Flock suggest currently that we continue with the above wound care measures for the time being. My suggestion is going to be that we have the patient follow up with you and see wound care/limb salvage to see what they have to say. If they take over care at that point I think that is actually appropriate he can cancel the appointment here. Otherwise we will make an appointment for him to follow-up in two weeks. Please see above for specific wound care orders. We will see patient for re-evaluation in 2 week(s) here in the clinic. If anything worsens or changes patient will contact our office for additional recommendations. Electronic Signature(s) Signed: 01/29/2018 8:35:57 AM By: Worthy Keeler PA-C Entered By: Worthy Keeler on 01/28/2018 10:16:57 [name] ([id number]) -------------------------------------------------------------------------------- ROS/PFSH Details Patient Name: [name] Date of Service: 01/25/2018 1:30 PM Medical Record Number: [id number] Patient Account Number: [id number] Date of Birth/Sex: Jun 13, 1965 (52 y.o. M) Treating RN: Cornell Barman Primary Care  Provider: [name] Other Clinician: Referring Provider: [name] Treating Provider/Extender: Melburn Hake, Gryphon Vanderveen Weeks in Treatment: 6 Information Obtained From Patient Wound History Do you currently have one or more open woundso Yes How  many open wounds do you currently haveo 3 Constitutional Symptoms (General Health) Complaints and Symptoms: Negative for: Fever; Chills Eyes Medical History: Negative for: Cataracts; Glaucoma; Optic Neuritis Ear/Nose/Mouth/Throat Medical History: Negative for: Chronic sinus problems/congestion; Middle ear problems Hematologic/Lymphatic Medical History: Negative for: Anemia; Human Immunodeficiency Virus; Lymphedema; Sickle Cell Disease Respiratory Complaints and Symptoms: No Complaints or Symptoms Medical History: Negative for: Aspiration; Asthma; Chronic Obstructive Pulmonary Disease (COPD); Pneumothorax; Sleep Apnea; Tuberculosis Cardiovascular Complaints and Symptoms: No Complaints or Symptoms Medical History: Positive for: Myocardial Infarction Negative for: Angina; Arrhythmia; Congestive Heart Failure; Coronary Artery Disease; Deep Vein Thrombosis; Hypertension; Hypotension; Peripheral Arterial Disease; Peripheral Venous Disease; Phlebitis; Vasculitis Gastrointestinal Medical History: Negative for: Cirrhosis ; Colitis; Crohnos; Hepatitis A; Hepatitis B; Hepatitis C Kilgour, Caedyn B. ([id number]) Endocrine Medical History: Negative for: Type I Diabetes; Type II Diabetes Genitourinary Medical History: Negative for: End Stage Renal Disease Immunological Medical History: Negative for: Lupus Erythematosus; Raynaudos; Scleroderma Integumentary (Skin) Medical History: Positive for: History of pressure wounds Negative for: History of Burn Musculoskeletal Medical History: Positive for: Rheumatoid Arthritis Negative for: Osteoarthritis; Osteomyelitis Neurologic Medical History: Positive for: Paraplegia - neck down  paralysis Negative for: Dementia; Neuropathy; Quadriplegia; Seizure Disorder Psychiatric Complaints and Symptoms: No Complaints or Symptoms Medical History: Positive for: Confinement Anxiety Negative for: Anorexia/bulimia Immunizations Pneumococcal Vaccine: Received Pneumococcal Vaccination: Yes Implantable Devices Family and Social History Cancer: Yes - Siblings,Mother; Diabetes: Yes - Siblings; Heart Disease: Yes - Father; Hypertension: Yes - Father; Kidney Disease: Yes - Siblings; Lung Disease: No; Seizures: No; Stroke: No; Thyroid Problems: No; Tuberculosis: No; Never smoker; Marital Status - Separated; Alcohol Use: Never; Drug Use: No History; Caffeine Use: Moderate; Financial Concerns: No; Food, Clothing or Shelter Needs: No; Support System Lacking: No; Transportation Concerns: No Physician Affirmation I have reviewed and agree with the above information. Electronic Signature(s) Signed: 01/28/2018 6:00:52 PM By: Gretta Cool, BSN, RN, CWS, Kim RN, BSN Hartley, Traivon B. ([id number]) Signed: 01/29/2018 8:35:57 AM By: Worthy Keeler PA-C Entered By: Worthy Keeler on 01/28/2018 10:15:34 [name] ([id number]) -------------------------------------------------------------------------------- SuperBill Details Patient Name: [name] Date of Service: 01/25/2018 Medical Record Number: [id number] Patient Account Number: [id number] Date of Birth/Sex: 11/24/1965 (52 y.o. 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 G82.54 Quadriplegia, C5-C7 incomplete R63.6  Underweight E44.0 Moderate protein-calorie malnutrition Z93.1 Gastrostomy status Facility Procedures CPT4 Code: [id number] Description: [phone] - WOUND CARE VISIT-LEV 5 EST PT Modifier: Quantity: 1 Physician Procedures CPT4 Code: [id number] Description: 59563 - WC PHYS LEVEL 3 - EST PT ICD-10 Diagnosis Description M86.172 Other acute osteomyelitis, left ankle and foot L97.512 Non-pressure chronic ulcer of other part of right foot with fat L97.524 Non-pressure chronic ulcer of other part of  left foot with necr G82.54 Quadriplegia, C5-C7 incomplete Modifier: layer exposed osis of bone Quantity: 1 Electronic Signature(s) Signed: 01/29/2018 8:35:57 AM By: Worthy Keeler PA-C Previous Signature: 01/25/2018 5:25:03 PM Version By: Gretta Cool, BSN, RN, CWS, Kim RN, BSN Entered By: Worthy Keeler on 01/26/2018 10:19:15

## 2018-01-30 NOTE — Progress Notes (Addendum)
Colin, [name] ([id number]) Visit Report for 01/25/2018 Arrival Information Details Patient Name: Colin, [name] Date of Service: 01/25/2018 1:30 PM Medical Record Number: [id number] Patient Account Number: [id number] Date of Birth/Sex: 1966-03-18 (52 y.o. M) Treating RN: Secundino Ginger Primary Care Charlesa Ehle: [name] Other Clinician: Referring Keagen Heinlen: [name] Treating Everlyn Farabaugh/Extender: Melburn Hake, HOYT Weeks in Treatment: 6 Visit Information History Since Last Visit Added or deleted any medications: No Patient Arrived: Stretcher Any new allergies or adverse reactions: No Arrival Time: 13:36 Had a fall or experienced change in No Accompanied By: ambulance activities of daily living that may affect Transfer Assistance: Other risk of falls: Patient Identification Verified: Yes Hospitalized since last visit: No Secondary Verification Process Completed: Yes Implantable device outside of the clinic excluding No cellular tissue based products placed in the center since last visit: Has Dressing in Place as Prescribed: Yes Pain Present Now: No Notes ambulance personnel transferred on to bed. Electronic Signature(s) Signed: 01/25/2018 4:40:10 PM By: Secundino Ginger Entered By: Secundino Ginger on 01/25/2018 14:29:13 [name] ([id number]) -------------------------------------------------------------------------------- Clinic Level of Care Assessment Details Patient Name: [name] Date of Service: 01/25/2018 1:30 PM Medical Record Number: [id number] Patient Account Number: [id number] Date of Birth/Sex: 09-11-1965 (52 y.o. M) Treating RN: Cornell Barman Primary Care Layla Kesling: [name] Other Clinician: Referring Madelin Weseman: [name] Treating Ginny Loomer/Extender: Melburn Hake, HOYT Weeks in Treatment: 6 Clinic Level of Care Assessment Items TOOL 4 Quantity Score []  - Use when only an EandM is performed on FOLLOW-UP visit 0 ASSESSMENTS - Nursing Assessment /  Reassessment []  - Reassessment of Co-morbidities (includes updates in patient status) 0 X- 1 5 Reassessment of Adherence to Treatment Plan ASSESSMENTS - Wound and Skin Assessment / Reassessment []  - Simple Wound Assessment / Reassessment - one wound 0 X- 6 5 Complex Wound Assessment / Reassessment - multiple wounds []  - 0 Dermatologic / Skin Assessment (not related to wound area) ASSESSMENTS - Focused Assessment []  - Circumferential Edema Measurements - multi extremities 0 []  - 0 Nutritional Assessment / Counseling / Intervention []  - 0 Lower Extremity Assessment (monofilament, tuning fork, pulses) []  - 0 Peripheral Arterial Disease Assessment (using hand held doppler) ASSESSMENTS - Ostomy and/or Continence Assessment and Care []  - Incontinence Assessment and Management 0 []  - 0 Ostomy Care Assessment and Management (repouching, etc.) PROCESS - Coordination of Care X - Simple Patient / Family Education for ongoing care 1 15 []  - 0 Complex (extensive) Patient / Family Education for ongoing care X- 1 10 Staff obtains Programmer, systems, Records, Test Results / Process Orders []  - 0 Staff telephones HHA, Nursing Homes / Clarify orders / etc []  - 0 Routine Transfer to another Facility (non-emergent condition) []  - 0 Routine Hospital Admission (non-emergent condition) []  - 0 New Admissions / Biomedical engineer / Ordering NPWT, Apligraf, etc. []  - 0 Emergency Hospital Admission (emergent condition) X- 1 10 Simple Discharge Coordination [name], Colin B. ([id number]) []  - 0 Complex (extensive) Discharge Coordination PROCESS - Special Needs []  - Pediatric / Minor Patient Management 0 []  - 0 Isolation Patient Management []  - 0 Hearing / Language / Visual special needs []  - 0 Assessment of Community assistance (transportation, D/C planning, etc.) []  - 0 Additional assistance / Altered mentation []  - 0 Support Surface(s) Assessment (bed, cushion, seat, etc.) INTERVENTIONS  - Wound Cleansing / Measurement []  - Simple Wound Cleansing - one wound 0 X- 6 5 Complex Wound Cleansing - multiple wounds X- 1 5 Wound Imaging (photographs - any number of  wounds) []  - 0 Wound Tracing (instead of photographs) []  - 0 Simple Wound Measurement - one wound X- 6 5 Complex Wound Measurement - multiple wounds INTERVENTIONS - Wound Dressings []  - Small Wound Dressing one or multiple wounds 0 X- 1 15 Medium Wound Dressing one or multiple wounds X- 2 20 Large Wound Dressing one or multiple wounds []  - 0 Application of Medications - topical []  - 0 Application of Medications - injection INTERVENTIONS - Miscellaneous []  - External ear exam 0 []  - 0 Specimen Collection (cultures, biopsies, blood, body fluids, etc.) []  - 0 Specimen(s) / Culture(s) sent or taken to Lab for analysis []  - 0 Patient Transfer (multiple staff / Civil Service fast streamer / Similar devices) []  - 0 Simple Staple / Suture removal (25 or less) []  - 0 Complex Staple / Suture removal (26 or more) []  - 0 Hypo / Hyperglycemic Management (close monitor of Blood Glucose) []  - 0 Ankle / Brachial Index (ABI) - do not check if billed separately X- 1 5 Vital Signs [name], Colin B. ([id number]) Has the patient been seen at the hospital within the last three years: Yes Total Score: 195 Level Of Care: New/Established - Level 5 Electronic Signature(s) Signed: 01/25/2018 6:05:50 PM By: [name], BSN, RN, CWS, Kim RN, BSN Entered By: [name], BSN, RN, CWS, Kim on 01/25/2018 14:40:45 [name] ([id number]) -------------------------------------------------------------------------------- Encounter Discharge Information Details Patient Name: [name] Date of Service: 01/25/2018 1:30 PM Medical Record Number: [id number] Patient Account Number: [id number] Date of Birth/Sex: 19-Aug-1965 (52 y.o. M) Treating RN: Montey Hora Primary Care Leisel Pinette: [name] Other Clinician: Referring Elfrieda Espino: [name] Treating Kenyah Luba/Extender: Melburn Hake, HOYT Weeks in Treatment: 6 Encounter Discharge Information Items Discharge Condition: Stable Ambulatory Status: Stretcher Discharge Destination: Home Transportation: Ambulance Accompanied By: EMS Schedule Follow-up Appointment: Yes Clinical Summary of Care: Electronic Signature(s) Signed: 01/25/2018 5:30:21 PM By: Montey Hora Entered By: Montey Hora on 01/25/2018 17:30:21 [name] ([id number]) -------------------------------------------------------------------------------- Lower Extremity Assessment Details Patient Name: [name] Date of Service: 01/25/2018 1:30 PM Medical Record Number: [id number] Patient Account Number: [id number] Date of Birth/Sex: March 01, 1966 (52 y.o. M) Treating RN: Secundino Ginger Primary Care Juel Ripley: [name] Other Clinician: Referring Tawania Daponte: [name] Treating Kelsie Zaborowski/Extender: Melburn Hake, HOYT Weeks in Treatment: 6 Electronic Signature(s) Signed: 01/25/2018 4:40:10 PM By: Secundino Ginger Entered By: Secundino Ginger on 01/25/2018 14:10:23 [name] ([id number]) -------------------------------------------------------------------------------- Multi Wound Chart Details Patient Name: [name] Date of Service: 01/25/2018 1:30 PM Medical Record Number: [id number] Patient Account Number: [id number] Date of Birth/Sex: Apr 16, 1965 (52 y.o. 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 Comorbid History: Myocardial Infarction, History Myocardial Infarction, History Myocardial Infarction, History of pressure wounds, of pressure wounds, of pressure wounds, Rheumatoid Arthritis, Rheumatoid Arthritis, Rheumatoid Arthritis, Paraplegia, Confinement Paraplegia, Confinement Paraplegia, Confinement Anxiety Anxiety Anxiety Date Acquired: 09/08/2017 09/07/2017 12/17/2017 Weeks of Treatment: 6 6 5  Wound Status: Open Open Open Clustered Wound: Yes No No Pending Amputation on Yes Yes No Presentation: Measurements L x W x D 0.1x0.1x0.1 4.4x2x0.2 5.5x2x0.1 (cm) Area (cm) : 0.008 6.912 8.639 Volume (  cm) : 0.001 1.382 0.864 % Reduction in Area: 99.90% 36.00% -129.20% % Reduction in Volume: 100.00% 84.00% -129.20% 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 ([id number]) Wound Margin: Indistinct, nonvisible Indistinct, nonvisible N/A Granulation Amount: None Present (0%) None Present (0%) Small (1-33%) Granulation Quality: N/A N/A Red, Pink Necrotic Amount: None Present (0%) Large (67-100%) None Present (0%) Necrotic Tissue: N/A Eschar N/A Exposed Structures: Fat Layer (Subcutaneous Fat Layer (Subcutaneous Fat Layer (Subcutaneous Tissue) Exposed: Yes Tissue) Exposed: Yes Tissue) Exposed: Yes Bone: Yes Bone: Yes Fascia: No Tendon: No Muscle: No Joint: No Bone: No Epithelialization: None None N/A Periwound Skin Texture: Scarring: Yes Scarring: Yes Scarring: Yes Excoriation: No Excoriation: No Induration: No Induration: No Callus: No Callus: No Crepitus: No Crepitus: No Rash: No Rash: No Periwound Skin Moisture: Maceration:  No Dry/Scaly: Yes No Abnormalities Noted Dry/Scaly: No Maceration: No Periwound Skin Color: Erythema: No Atrophie Blanche: No Atrophie Blanche: No Cyanosis: No Cyanosis: No Ecchymosis: No Ecchymosis: No Erythema: No Erythema: No Hemosiderin Staining: No Hemosiderin Staining: No Mottled: No Mottled: No Pallor: No Pallor: No Rubor: No Rubor: No Temperature: No Abnormality No Abnormality N/A Tenderness on Palpation: No No No Wound Preparation: Ulcer Cleansing: Ulcer Cleansing: Ulcer Cleansing: Rinsed/Irrigated with Saline, Rinsed/Irrigated with Saline, Rinsed/Irrigated with Saline, Wound Cleanser Wound Cleanser Wound Cleanser Topical Anesthetic Applied: Topical Anesthetic Applied: Topical Anesthetic Applied: Other: lidocaine 4% Other: lidocaine 4% Other: lidocaine 4% Wound Number: 5 6 N/A Photos: No Photos No Photos N/A Wound Location: Left Lower Leg - Posterior, Right Lower Leg - Posterior, N/A Proximal Proximal Wounding Event: Pressure Injury Shear/Friction N/A Primary Etiology: Pressure Ulcer Abrasion N/A Comorbid History: Myocardial Infarction, History Myocardial Infarction, History N/A of pressure wounds, of pressure wounds, Rheumatoid Arthritis, Rheumatoid Arthritis, Paraplegia, Confinement Paraplegia, Confinement Anxiety Anxiety Date Acquired: 12/17/2017 01/05/2018 N/A Weeks of Treatment: 5 2 N/A Wound Status: Open Open N/A Clustered Wound: No No N/A Pending Amputation on No No N/A Presentation: Measurements L x W x D 1.5x1x0.1 0.5x0.5x0.1 N/A (cm) Area (cm) : 1.178 0.196 N/A Debruyne, Joshiah B. ([id number]) Volume (cm) : 0.118 0.02 N/A % Reduction in Area: 63.90% -176.10% N/A % Reduction in Volume: 63.90% -185.70% N/A Classification: Category/Stage III Unclassifiable N/A Exudate Amount: Small Small N/A Exudate Type: Sanguinous Serous N/A Exudate Color: red amber N/A Foul Odor After Cleansing: Yes No N/A Odor Anticipated Due to No N/A  N/A Product Use: Wound Margin: N/A N/A N/A Granulation Amount: None Present (0%) None Present (0%) N/A Granulation Quality: N/A N/A N/A Necrotic Amount: None Present (0%) None Present (0%) N/A Necrotic Tissue: N/A N/A N/A Exposed Structures: Fat Layer (Subcutaneous Fascia: No N/A Tissue) Exposed: Yes Fat Layer (Subcutaneous Fascia: No Tissue) Exposed: No Tendon: No Tendon: No Muscle: No Muscle: No Joint: No Joint: No Bone: No Bone: No Epithelialization: N/A N/A N/A Periwound Skin Texture: Excoriation: No Excoriation: No N/A Induration: No Induration: No Callus: No Callus: No Crepitus: No Crepitus: No Rash: No Rash: No Scarring: No Scarring: No Periwound Skin Moisture: Maceration: No Maceration: No N/A Dry/Scaly: No Dry/Scaly: No Periwound Skin Color: Atrophie Blanche: No Atrophie Blanche: No N/A Cyanosis: No Cyanosis: No Ecchymosis: No Ecchymosis: No Erythema: No Erythema: No Hemosiderin Staining: No Hemosiderin Staining: No Mottled: No Mottled: No Pallor: No Pallor: No Rubor: No Rubor:  No Temperature: N/A N/A N/A Tenderness on Palpation: No No N/A Wound Preparation: Ulcer Cleansing: Ulcer Cleansing: Wound N/A Rinsed/Irrigated with Saline, Cleanser Wound Cleanser Topical Anesthetic Applied: Topical Anesthetic Applied: Other: lidocaine 4% Other: lidocaine 4% Treatment Notes Electronic Signature(s) Signed: 01/25/2018 6:05:50 PM By: [name], BSN, RN, CWS, Kim RN, BSN Entered By: [name], BSN, RN, CWS, Kim on 01/25/2018 14:33:11 [name] ([id number]) -------------------------------------------------------------------------------- Multi-Disciplinary Care Plan Details Patient Name: [name] Date of Service: 01/25/2018 1:30 PM Medical Record Number: [id number] Patient Account Number: [id number] Date of Birth/Sex: [birth year] (52 y.o. M) Treating RN: Cornell Barman Primary Care Ricca Melgarejo: [name] Other Clinician: Referring  Keean Wilmeth: [name] Treating Teara Duerksen/Extender: Melburn Hake, HOYT Weeks in Treatment: 6 Active Inactive Electronic Signature(s) Signed: 02/12/2018 7:41:48 AM By: [name], BSN, RN, CWS, Kim RN, BSN Previous Signature: 01/25/2018 6:05:50 PM Version By: [name], BSN, RN, CWS, Kim RN, BSN Entered By: [name], BSN, RN, CWS, Kim on 02/12/2018 07:41:47 [name] ([id number]) -------------------------------------------------------------------------------- Non-Wound Condition Assessment Details Patient Name: [name] Date of Service: 01/25/2018 1:30 PM Medical Record Number: [id number] Patient Account Number: [id number] Date of Birth/Sex: [birth year] (52 y.o. M) Treating RN: Secundino Ginger Primary Care Aidee Latimore: [name] Other Clinician: Referring Mindel Friscia: [name] Treating Shaun Runyon/Extender: Melburn Hake, HOYT Weeks in Treatment: 6 Non-Wound Condition: Condition: Other Dermatologic Condition Location: Gluteus Side: Right Photos Periwound Skin Texture Texture Color No Abnormalities Noted: Yes No Abnormalities Noted: No Atrophie Blanche: No Moisture Cyanosis: No No Abnormalities Noted: No Ecchymosis: No Dry / Scaly: No Erythema: No Maceration: No Hemosiderin Staining: No Mottled: No Pallor: No Rubor: No Electronic Signature(s) Signed: 01/25/2018 4:40:10 PM By: Secundino Ginger Entered By: Secundino Ginger on 01/25/2018 14:30:27 [name] ([id number]) -------------------------------------------------------------------------------- Pain Assessment Details Patient Name: [name] Date of Service: 01/25/2018 1:30 PM Medical Record Number: [id number] Patient Account Number: [id number] Date of Birth/Sex: 1965-04-23 (52 y.o. M) Treating RN: Secundino Ginger Primary Care Sameeha Rockefeller: [name] Other Clinician: Referring Berdell Hostetler: [name] Treating Matia Zelada/Extender: Melburn Hake, HOYT Weeks in Treatment: 6 Active Problems Location of Pain Severity and Description of  Pain Patient Has Paino Yes Site Locations Pain Management and Medication Current Pain Management: Goals for Pain Management pt stated has intermittent pain. denies pain any new pain at this time. encouraged to see primary for pain management. Electronic Signature(s) Signed: 01/25/2018 4:40:10 PM By: Secundino Ginger Entered By: Secundino Ginger on 01/25/2018 13:42:42 [name] ([id number]) -------------------------------------------------------------------------------- Patient/Caregiver Education Details Patient Name: [name] Date of Service: 01/25/2018 1:30 PM Medical Record Number: [id number] Patient Account Number: [id number] Date of Birth/Gender: [birth year] (52 y.o. M) Treating RN: Cornell Barman Primary Care Physician: [name] Other Clinician: Referring Physician: [name] Treating Physician/Extender: Sharalyn Ink in Treatment: 6 Education Assessment Education Provided To: Patient Education Topics Provided Infection: Handouts: Infection Prevention and Management Methods: Demonstration, Explain/Verbal Responses: State content correctly Pressure: Handouts: Pressure Ulcers: Care and Offloading Methods: Demonstration, Explain/Verbal Responses: State content correctly Electronic Signature(s) Signed: 01/25/2018 6:05:50 PM By: [name], BSN, RN, CWS, Kim RN, BSN Entered By: [name], BSN, RN, CWS, Kim on 01/25/2018 17:25:36 [name] ([id number]) -------------------------------------------------------------------------------- Wound Assessment Details Patient Name: [name] Date of Service: 01/25/2018 1:30 PM Medical Record Number: [id number] Patient Account Number: [id number] Date of Birth/Sex: 25-Aug-1965 (52 y.o. M) Treating RN: Secundino Ginger Primary Care Dniyah Grant: [name] Other Clinician: Referring Arvada Seaborn: [name] Treating Kourtnie Sachs/Extender: Melburn Hake, HOYT Weeks in Treatment: 6 Wound Status Wound Number: 1 Primary Pressure  Ulcer Etiology: Wound  Location: Right Foot - Dorsal Wound Open Wounding Event: Gradually Appeared Status: Date Acquired: 09/08/2017 Comorbid Myocardial Infarction, History of pressure Weeks Of Treatment: 6 History: wounds, Rheumatoid Arthritis, Paraplegia, Clustered Wound: Yes Confinement Anxiety Pending Amputation On Presentation Photos Photo Uploaded By: Secundino Ginger on 01/25/2018 14:32:06 Wound Measurements Length: (cm) 0.1 Width: (cm) 0.1 Depth: (cm) 0.1 Area: (cm) 0.008 Volume: (cm) 0.001 % Reduction in Area: 99.9% % Reduction in Volume: 100% Epithelialization: None Tunneling: No Undermining: No Wound Description Classification: Category/Stage IV Wound Margin: Indistinct, nonvisible Exudate Amount: None Present Foul Odor After Cleansing: Yes Due to Product Use: No Slough/Fibrino No Wound Bed Granulation Amount: None Present (0%) Exposed Structure Necrotic Amount: None Present (0%) Fat Layer (Subcutaneous Tissue) Exposed: Yes Bone Exposed: Yes Periwound Skin Texture Texture Color No Abnormalities Noted: No No Abnormalities Noted: No Scarring: Yes Erythema: No Moisture Temperature / Pain Christopherson, Leeland B. ([id number]) No Abnormalities Noted: No Temperature: No Abnormality Dry / Scaly: No Maceration: No Wound Preparation Ulcer Cleansing: Rinsed/Irrigated with Saline, Wound Cleanser Topical Anesthetic Applied: Other: lidocaine 4%, Electronic Signature(s) Signed: 01/25/2018 4:40:10 PM By: Secundino Ginger Entered By: Secundino Ginger on 01/25/2018 13:53:45 [name] ([id number]) -------------------------------------------------------------------------------- Wound Assessment Details Patient Name: [name] Date of Service: 01/25/2018 1:30 PM Medical Record Number: [id number] Patient Account Number: [id number] Date of Birth/Sex: 10/31/1965 (52 y.o. M) Treating RN: Secundino Ginger Primary Care Deannie Resetar: [name] Other Clinician: Referring  Josephus Harriger: [name] Treating Johnthomas Lader/Extender: Melburn Hake, HOYT Weeks in Treatment: 6 Wound Status Wound Number: 2 Primary Bacterial Osteomyelitis Etiology: Wound Location: Left Foot - Dorsal Wound Open Wounding Event: Gradually Appeared Status: Date Acquired: 09/07/2017 Comorbid Myocardial Infarction, History of pressure Weeks Of Treatment: 6 History: wounds, Rheumatoid Arthritis, Paraplegia, Clustered Wound: No Confinement Anxiety Pending Amputation On Presentation Photos Photo Uploaded By: Secundino Ginger on 01/25/2018 14:32:07 Wound Measurements Length: (cm) 4.4 Width: (cm) 2 Depth: (cm) 0.2 Area: (cm) 6.912 Volume: (cm) 1.382 % Reduction in Area: 36% % Reduction in Volume: 84% Epithelialization: None Tunneling: No Undermining: No Wound Description Full Thickness With Exposed Support Foul Odor Classification: Structures Due to Pr Wound Margin: Indistinct, nonvisible Slough/Fi Exudate Small Amount: Exudate Type: Serosanguineous Exudate Color: red, brown After Cleansing: Yes oduct Use: No brino Yes Wound Bed Granulation Amount: None Present (0%) Exposed Structure Necrotic Amount: Large (67-100%) Fat Layer (Subcutaneous Tissue) Exposed: Yes Necrotic Quality: Eschar Bone Exposed: Yes Periwound Skin Texture Texture Color No Abnormalities Noted: No No Abnormalities Noted: No Higby, Devion B. ([id number]) Callus: No Atrophie Blanche: No Crepitus: No Cyanosis: No Excoriation: No Ecchymosis: No Induration: No Erythema: No Rash: No Hemosiderin Staining: No Scarring: Yes Mottled: No Pallor: No Moisture Rubor: No No Abnormalities Noted: No Dry / Scaly: Yes Temperature / Pain Maceration: No Temperature: No Abnormality Wound Preparation Ulcer Cleansing: Rinsed/Irrigated with Saline, Wound Cleanser Topical Anesthetic Applied: Other: lidocaine 4%, Electronic Signature(s) Signed: 01/25/2018 4:40:10 PM By: Secundino Ginger Entered By: Secundino Ginger on  01/25/2018 13:58:56 [name] ([id number]) -------------------------------------------------------------------------------- Wound Assessment Details Patient Name: [name] Date of Service: 01/25/2018 1:30 PM Medical Record Number: [id number] Patient Account Number: [id number] Date of Birth/Sex: 11/25/1965 (52 y.o. M) Treating RN: Secundino Ginger Primary Care Luna Audia: [name] Other Clinician: Referring Deagan Sevin: [name] Treating Kindrick Lankford/Extender: Melburn Hake, HOYT Weeks in Treatment: 6 Wound Status Wound Number: 4 Primary Pressure Ulcer Etiology: Wound Location: Left Lower Leg - Posterior, Distal Wound Open Wounding Event: Pressure Injury Status: Date Acquired: 12/17/2017 Comorbid Myocardial Infarction, History of pressure Weeks Of Treatment:  5 History: wounds, Rheumatoid Arthritis, Paraplegia, Clustered Wound: No Confinement Anxiety Photos Photo Uploaded By: Secundino Ginger on 01/25/2018 15:00:40 Wound Measurements Length: (cm) 5.5 Width: (cm) 2 Depth: (cm) 0.1 Area: (cm) 8.639 Volume: (cm) 0.864 % Reduction in Area: -129.2% % Reduction in Volume: -129.2% Tunneling: No Undermining: No Wound Description Classification: Category/Stage III Exudate Amount: Small Exudate Type: Sanguinous Exudate Color: red Foul Odor After Cleansing: Yes Due to Product Use: No Slough/Fibrino No Wound Bed Granulation Amount: Small (1-33%) Exposed Structure Granulation Quality: Red, Pink Fascia Exposed: No Necrotic Amount: None Present (0%) Fat Layer (Subcutaneous Tissue) Exposed: Yes Tendon Exposed: No Muscle Exposed: No Joint Exposed: No Bone Exposed: No Periwound Skin Texture Texture Color [place], Male B. ([id number]) No Abnormalities Noted: No No Abnormalities Noted: No Callus: No Atrophie Blanche: No Crepitus: No Cyanosis: No Excoriation: No Ecchymosis: No Induration: No Erythema: No Rash: No Hemosiderin Staining: No Scarring:  Yes Mottled: No Pallor: No Moisture Rubor: No No Abnormalities Noted: No Wound Preparation Ulcer Cleansing: Rinsed/Irrigated with Saline, Wound Cleanser Topical Anesthetic Applied: Other: lidocaine 4%, Electronic Signature(s) Signed: 01/25/2018 4:40:10 PM By: Secundino Ginger Entered By: Secundino Ginger on 01/25/2018 14:04:26 [name] ([id number]) -------------------------------------------------------------------------------- Wound Assessment Details Patient Name: [name] Date of Service: 01/25/2018 1:30 PM Medical Record Number: [id number] Patient Account Number: [id number] Date of Birth/Sex: January 31, 1966 (52 y.o. M) Treating RN: Secundino Ginger Primary Care Collins Kerby: [name] Other Clinician: Referring Arhum Peeples: [name] Treating Khang Hannum/Extender: Melburn Hake, HOYT Weeks in Treatment: 6 Wound Status Wound Number: 5 Primary Pressure Ulcer Etiology: Wound Location: Left Lower Leg - Posterior, Proximal Wound Open Wounding Event: Pressure Injury Status: Date Acquired: 12/17/2017 Comorbid Myocardial Infarction, History of pressure Weeks Of Treatment: 5 History: wounds, Rheumatoid Arthritis, Paraplegia, Clustered Wound: No Confinement Anxiety Photos Photo Uploaded By: Secundino Ginger on 01/25/2018 15:08:50 Wound Measurements Length: (cm) 1.5 Width: (cm) 1 Depth: (cm) 0.1 Area: (cm) 1.178 Volume: (cm) 0.118 % Reduction in Area: 63.9% % Reduction in Volume: 63.9% Tunneling: No Undermining: No Wound Description Classification: Category/Stage III Exudate Amount: Small Exudate Type: Sanguinous Exudate Color: red Foul Odor After Cleansing: Yes Due to Product Use: No Slough/Fibrino No Wound Bed Granulation Amount: None Present (0%) Exposed Structure Necrotic Amount: None Present (0%) Fascia Exposed: No Fat Layer (Subcutaneous Tissue) Exposed: Yes Tendon Exposed: No Muscle Exposed: No Joint Exposed: No Bone Exposed: No Periwound Skin Texture Texture  Color Maruyama, Mercy B. ([id number]) No Abnormalities Noted: No No Abnormalities Noted: No Callus: No Atrophie Blanche: No Crepitus: No Cyanosis: No Excoriation: No Ecchymosis: No Induration: No Erythema: No Rash: No Hemosiderin Staining: No Scarring: No Mottled: No Pallor: No Moisture Rubor: No No Abnormalities Noted: No Dry / Scaly: No Maceration: No Wound Preparation Ulcer Cleansing: Rinsed/Irrigated with Saline, Wound Cleanser Topical Anesthetic Applied: Other: lidocaine 4%, Electronic Signature(s) Signed: 01/25/2018 4:40:10 PM By: Secundino Ginger Entered By: Secundino Ginger on 01/25/2018 14:07:23 [name] ([id number]) -------------------------------------------------------------------------------- Wound Assessment Details Patient Name: [name] Date of Service: 01/25/2018 1:30 PM Medical Record Number: [id number] Patient Account Number: [id number] Date of Birth/Sex: [birth year] (52 y.o. M) Treating RN: Secundino Ginger Primary Care Kura Bethards: [name] Other Clinician: Referring Sarahgrace Broman: [name] Treating Oluwatobi Ruppe/Extender: Melburn Hake, HOYT Weeks in Treatment: 6 Wound Status Wound Number: 6 Primary Abrasion Etiology: Wound Location: Right Lower Leg - Posterior, Proximal Wound Open Wounding Event: Shear/Friction Status: Date Acquired: 01/05/2018 Comorbid Myocardial Infarction, History of pressure Weeks Of Treatment: 2 History: wounds, Rheumatoid Arthritis, Paraplegia, Clustered Wound: No Confinement Anxiety Photos Photo  Uploaded By: Secundino Ginger on 01/25/2018 15:08:52 Wound Measurements Length: (cm) 0.5 Width: (cm) 0.5 Depth: (cm) 0.1 Area: (cm) 0.196 Volume: (cm) 0.02 % Reduction in Area: -176.1% % Reduction in Volume: -185.7% Wound Description Classification: Unclassifiable Exudate Amount: Small Exudate Type: Serous Exudate Color: amber Foul Odor After Cleansing: No Slough/Fibrino No Wound Bed Granulation Amount: None Present  (0%) Exposed Structure Necrotic Amount: None Present (0%) Fascia Exposed: No Fat Layer (Subcutaneous Tissue) Exposed: No Tendon Exposed: No Muscle Exposed: No Joint Exposed: No Bone Exposed: No Periwound Skin Texture Texture Color Bonfanti, Kenwood B. ([id number]) No Abnormalities Noted: Yes No Abnormalities Noted: No Atrophie Blanche: No Moisture Cyanosis: No No Abnormalities Noted: No Ecchymosis: No Dry / Scaly: No Erythema: No Maceration: No Hemosiderin Staining: No Mottled: No Pallor: No Rubor: No Wound Preparation Ulcer Cleansing: Wound Cleanser Topical Anesthetic Applied: Other: lidocaine 4%, Electronic Signature(s) Signed: 01/25/2018 4:40:10 PM By: Secundino Ginger Entered By: Secundino Ginger on 01/25/2018 14:10:14 [name] ([id number]) -------------------------------------------------------------------------------- Vitals Details Patient Name: [name] Date of Service: 01/25/2018 1:30 PM Medical Record Number: [id number] Patient Account Number: [id number] Date of Birth/Sex: July 25, 1965 (52 y.o. M) Treating RN: Secundino Ginger Primary Care Linnae Rasool: [name] Other Clinician: Referring Klaudia Beirne: [name] Treating Ardella Chhim/Extender: Melburn Hake, HOYT Weeks in Treatment: 6 Vital Signs Time Taken: 13:42 Temperature (F): 98.6 Height (in): 72 Pulse (bpm): 68 Respiratory Rate (breaths/min): 16 Blood Pressure (mmHg): 148/88 Reference Range: 80 - 120 mg / dl Electronic Signature(s) Signed: 01/25/2018 4:40:10 PM By: Secundino Ginger Entered By: Secundino Ginger on 01/25/2018 13:48:20

## 2018-02-03 ENCOUNTER — Ambulatory Visit (INDEPENDENT_AMBULATORY_CARE_PROVIDER_SITE_OTHER): Payer: Medicare Other | Admitting: Urology

## 2018-02-03 DIAGNOSIS — N318 Other neuromuscular dysfunction of bladder: Secondary | ICD-10-CM

## 2018-02-08 ENCOUNTER — Ambulatory Visit: Payer: Medicare Other | Admitting: Physician Assistant

## 2018-04-14 DIAGNOSIS — S14109D Unspecified injury at unspecified level of cervical spinal cord, subsequent encounter: Secondary | ICD-10-CM | POA: Insufficient documentation

## 2018-04-14 DIAGNOSIS — M86672 Other chronic osteomyelitis, left ankle and foot: Secondary | ICD-10-CM | POA: Insufficient documentation

## 2018-04-19 DIAGNOSIS — F320 Major depressive disorder, single episode, mild: Secondary | ICD-10-CM | POA: Insufficient documentation

## 2018-04-19 DIAGNOSIS — R64 Cachexia: Secondary | ICD-10-CM | POA: Insufficient documentation

## 2019-02-18 ENCOUNTER — Encounter (HOSPITAL_COMMUNITY): Payer: Self-pay | Admitting: Emergency Medicine

## 2019-02-18 ENCOUNTER — Emergency Department (HOSPITAL_COMMUNITY)
Admission: EM | Admit: 2019-02-18 | Discharge: 2019-02-18 | Disposition: A | Discharge status: Home / Self Care | Payer: Medicare Other | Attending: Emergency Medicine | Admitting: Emergency Medicine

## 2019-02-18 ENCOUNTER — Telehealth: Payer: Self-pay | Admitting: *Deleted

## 2019-02-18 ENCOUNTER — Other Ambulatory Visit: Payer: Self-pay

## 2019-02-18 DIAGNOSIS — R8290 Unspecified abnormal findings in urine: Secondary | ICD-10-CM

## 2019-02-18 DIAGNOSIS — Z79899 Other long term (current) drug therapy: Secondary | ICD-10-CM | POA: Diagnosis not present

## 2019-02-18 DIAGNOSIS — G8250 Quadriplegia, unspecified: Secondary | ICD-10-CM | POA: Insufficient documentation

## 2019-02-18 LAB — URINALYSIS, ROUTINE W REFLEX MICROSCOPIC
Bilirubin Urine: NEGATIVE
Glucose, UA: NEGATIVE mg/dL
Hgb urine dipstick: NEGATIVE
Ketones, ur: NEGATIVE mg/dL
Nitrite: POSITIVE — AB
Protein, ur: NEGATIVE mg/dL
Specific Gravity, Urine: 1.015 (ref 1.005–1.030)
pH: 7 (ref 5.0–8.0)

## 2019-02-18 LAB — URINALYSIS, MICROSCOPIC (REFLEX): Squamous Epithelial / LPF: NONE SEEN (ref 0–5)

## 2019-02-18 MED ORDER — CEPHALEXIN 500 MG PO CAPS: 500.0000 mg | ORAL_CAPSULE | Freq: Four times a day (QID) | ORAL | 0 refills | Status: DC

## 2019-02-18 MED ORDER — CEPHALEXIN 500 MG PO CAPS
500.0000 mg | ORAL_CAPSULE | Freq: Once | ORAL | Status: AC
  Administered 2019-02-18: 500 mg via ORAL
  Filled 2019-02-18: qty 1

## 2019-02-18 NOTE — ED Notes (Signed)
Pt unable to sign e-signature pad due to paraplegia. Discharge instructions reviewed with patient including follow-up and return conditions. EMS to transport patient back home.
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
## 2019-02-18 NOTE — ED Notes (Signed)
[facility] arrived to transport patient home.

## 2019-02-18 NOTE — ED Notes (Signed)
Secretary to notify EMS for transportation home.

## 2019-02-18 NOTE — Telephone Encounter (Signed)
Error

## 2019-02-19 LAB — URINE CULTURE: Culture: >=100000 — AB

## 2019-08-12 ENCOUNTER — Emergency Department (HOSPITAL_COMMUNITY): Payer: Medicare Other

## 2019-08-12 ENCOUNTER — Encounter (HOSPITAL_COMMUNITY): Payer: Self-pay

## 2019-08-12 ENCOUNTER — Emergency Department (HOSPITAL_COMMUNITY)
Admission: EM | Admit: 2019-08-12 | Discharge: 2019-08-12 | Disposition: A | Discharge status: Home / Self Care | Payer: Medicare Other | Attending: Emergency Medicine | Admitting: Emergency Medicine

## 2019-08-12 DIAGNOSIS — L03221 Cellulitis of neck: Secondary | ICD-10-CM | POA: Insufficient documentation

## 2019-08-12 DIAGNOSIS — G8250 Quadriplegia, unspecified: Secondary | ICD-10-CM | POA: Insufficient documentation

## 2019-08-12 DIAGNOSIS — R221 Localized swelling, mass and lump, neck: Secondary | ICD-10-CM | POA: Diagnosis present

## 2019-08-12 DIAGNOSIS — L0211 Cutaneous abscess of neck: Secondary | ICD-10-CM | POA: Diagnosis not present

## 2019-08-12 DIAGNOSIS — Z79899 Other long term (current) drug therapy: Secondary | ICD-10-CM | POA: Insufficient documentation

## 2019-08-12 LAB — CBC WITH DIFFERENTIAL/PLATELET
Abs Immature Granulocytes: 0.02 10*3/uL (ref 0.00–0.07)
Basophils Absolute: 0 10*3/uL (ref 0.0–0.1)
Basophils Relative: 1 %
Eosinophils Absolute: 0.8 10*3/uL — ABNORMAL HIGH (ref 0.0–0.5)
Eosinophils Relative: 13 %
HCT: 36.2 % — ABNORMAL LOW (ref 39.0–52.0)
Hemoglobin: 11.5 g/dL — ABNORMAL LOW (ref 13.0–17.0)
Immature Granulocytes: 0 %
Lymphocytes Relative: 26 %
Lymphs Abs: 1.6 10*3/uL (ref 0.7–4.0)
MCH: 24.9 pg — ABNORMAL LOW (ref 26.0–34.0)
MCHC: 31.8 g/dL (ref 30.0–36.0)
MCV: 78.5 fL — ABNORMAL LOW (ref 80.0–100.0)
Monocytes Absolute: 0.6 10*3/uL (ref 0.1–1.0)
Monocytes Relative: 9 %
Neutro Abs: 3.2 10*3/uL (ref 1.7–7.7)
Neutrophils Relative %: 51 %
Platelets: 377 10*3/uL (ref 150–400)
RBC: 4.61 MIL/uL (ref 4.22–5.81)
RDW: 17.2 % — ABNORMAL HIGH (ref 11.5–15.5)
WBC: 6.2 10*3/uL (ref 4.0–10.5)
nRBC: 0 % (ref 0.0–0.2)

## 2019-08-12 LAB — BASIC METABOLIC PANEL
Anion gap: 6 (ref 5–15)
BUN: 20 mg/dL (ref 6–20)
CO2: 26 mmol/L (ref 22–32)
Calcium: 9.5 mg/dL (ref 8.9–10.3)
Chloride: 103 mmol/L (ref 98–111)
Creatinine, Ser: 0.45 mg/dL — ABNORMAL LOW (ref 0.61–1.24)
GFR calc Af Amer: >60 mL/min (ref >60)
GFR calc non Af Amer: >60 mL/min (ref >60)
Glucose, Bld: 84 mg/dL (ref 70–99)
Potassium: 4 mmol/L (ref 3.5–5.1)
Sodium: 135 mmol/L (ref 135–145)

## 2019-08-12 MED ORDER — CLINDAMYCIN HCL 150 MG PO CAPS: 300.0000 mg | ORAL_CAPSULE | Freq: Three times a day (TID) | ORAL | 0 refills | Status: DC

## 2019-08-12 MED ORDER — IOHEXOL 300 MG/ML  SOLN
100.0000 mL | Freq: Once | INTRAMUSCULAR | Status: AC | PRN
  Administered 2019-08-12: 75 mL via INTRAVENOUS

## 2019-08-12 NOTE — ED Notes (Signed)
Called EMS for transport back home 
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
## 2019-08-12 NOTE — ED Triage Notes (Signed)
Pt is here for a cyst on right jawline. Pt is from home. Pt wants to make sure it is not infected. He got a tooth pulled and is on antibiotics.

## 2019-08-12 NOTE — ED Notes (Signed)
EMS here to pick up patient to return home.

## 2019-08-12 NOTE — Discharge Instructions (Signed)
You have a small abscess with surrounding skin infection  Please stop Cephalexin - start Clindamycin - take one tablet 3 times daily for 10 days Warm Compresses multiple times per day  ER for worsening swelling / pain / redness / fever or drainage  See your doctor in 2 days for recheck

## 2019-10-12 ENCOUNTER — Emergency Department (HOSPITAL_COMMUNITY): Payer: Medicare Other

## 2019-10-12 ENCOUNTER — Inpatient Hospital Stay (HOSPITAL_COMMUNITY): Payer: Medicare Other

## 2019-10-12 ENCOUNTER — Inpatient Hospital Stay: Payer: Self-pay

## 2019-10-12 ENCOUNTER — Encounter (HOSPITAL_COMMUNITY): Payer: Self-pay | Admitting: Emergency Medicine

## 2019-10-12 ENCOUNTER — Inpatient Hospital Stay (HOSPITAL_COMMUNITY)
Admission: EM | Admit: 2019-10-12 | Discharge: 2019-10-17 | DRG: 698 | Disposition: A | Discharge status: Home Health | Payer: Medicare Other | Attending: Family Medicine | Admitting: Family Medicine

## 2019-10-12 ENCOUNTER — Other Ambulatory Visit: Payer: Self-pay

## 2019-10-12 DIAGNOSIS — L89152 Pressure ulcer of sacral region, stage 2: Secondary | ICD-10-CM | POA: Diagnosis present

## 2019-10-12 DIAGNOSIS — R6520 Severe sepsis without septic shock: Secondary | ICD-10-CM | POA: Diagnosis not present

## 2019-10-12 DIAGNOSIS — Z20822 Contact with and (suspected) exposure to covid-19: Secondary | ICD-10-CM | POA: Diagnosis present

## 2019-10-12 DIAGNOSIS — Z79899 Other long term (current) drug therapy: Secondary | ICD-10-CM | POA: Diagnosis not present

## 2019-10-12 DIAGNOSIS — Z91013 Allergy to seafood: Secondary | ICD-10-CM | POA: Diagnosis not present

## 2019-10-12 DIAGNOSIS — D649 Anemia, unspecified: Secondary | ICD-10-CM | POA: Diagnosis present

## 2019-10-12 DIAGNOSIS — N179 Acute kidney failure, unspecified: Secondary | ICD-10-CM

## 2019-10-12 DIAGNOSIS — N132 Hydronephrosis with renal and ureteral calculous obstruction: Secondary | ICD-10-CM | POA: Diagnosis present

## 2019-10-12 DIAGNOSIS — N390 Urinary tract infection, site not specified: Secondary | ICD-10-CM | POA: Diagnosis not present

## 2019-10-12 DIAGNOSIS — F419 Anxiety disorder, unspecified: Secondary | ICD-10-CM | POA: Diagnosis present

## 2019-10-12 DIAGNOSIS — N319 Neuromuscular dysfunction of bladder, unspecified: Secondary | ICD-10-CM | POA: Diagnosis present

## 2019-10-12 DIAGNOSIS — A4159 Other Gram-negative sepsis: Secondary | ICD-10-CM | POA: Diagnosis present

## 2019-10-12 DIAGNOSIS — G8250 Quadriplegia, unspecified: Secondary | ICD-10-CM | POA: Diagnosis present

## 2019-10-12 DIAGNOSIS — K219 Gastro-esophageal reflux disease without esophagitis: Secondary | ICD-10-CM | POA: Diagnosis present

## 2019-10-12 DIAGNOSIS — Z7401 Bed confinement status: Secondary | ICD-10-CM | POA: Diagnosis not present

## 2019-10-12 DIAGNOSIS — N200 Calculus of kidney: Secondary | ICD-10-CM

## 2019-10-12 DIAGNOSIS — Z978 Presence of other specified devices: Secondary | ICD-10-CM | POA: Diagnosis not present

## 2019-10-12 DIAGNOSIS — S14109S Unspecified injury at unspecified level of cervical spinal cord, sequela: Secondary | ICD-10-CM | POA: Diagnosis present

## 2019-10-12 DIAGNOSIS — E871 Hypo-osmolality and hyponatremia: Secondary | ICD-10-CM | POA: Diagnosis present

## 2019-10-12 DIAGNOSIS — T83511A Infection and inflammatory reaction due to indwelling urethral catheter, initial encounter: Secondary | ICD-10-CM | POA: Diagnosis present

## 2019-10-12 DIAGNOSIS — A419 Sepsis, unspecified organism: Secondary | ICD-10-CM | POA: Diagnosis not present

## 2019-10-12 DIAGNOSIS — G8254 Quadriplegia, C5-C7 incomplete: Secondary | ICD-10-CM | POA: Diagnosis not present

## 2019-10-12 DIAGNOSIS — Z981 Arthrodesis status: Secondary | ICD-10-CM | POA: Diagnosis not present

## 2019-10-12 DIAGNOSIS — Z888 Allergy status to other drugs, medicaments and biological substances status: Secondary | ICD-10-CM | POA: Diagnosis not present

## 2019-10-12 DIAGNOSIS — E860 Dehydration: Secondary | ICD-10-CM | POA: Diagnosis present

## 2019-10-12 DIAGNOSIS — K592 Neurogenic bowel, not elsewhere classified: Secondary | ICD-10-CM | POA: Diagnosis present

## 2019-10-12 DIAGNOSIS — R6521 Severe sepsis with septic shock: Secondary | ICD-10-CM | POA: Diagnosis present

## 2019-10-12 LAB — CBC WITH DIFFERENTIAL/PLATELET
Abs Immature Granulocytes: 0.61 10*3/uL — ABNORMAL HIGH (ref 0.00–0.07)
Basophils Absolute: 0.1 10*3/uL (ref 0.0–0.1)
Basophils Relative: 0 %
Eosinophils Absolute: 0 10*3/uL (ref 0.0–0.5)
Eosinophils Relative: 0 %
HCT: 38.8 % — ABNORMAL LOW (ref 39.0–52.0)
Hemoglobin: 12.8 g/dL — ABNORMAL LOW (ref 13.0–17.0)
Immature Granulocytes: 3 %
Lymphocytes Relative: 4 %
Lymphs Abs: 0.9 10*3/uL (ref 0.7–4.0)
MCH: 25.5 pg — ABNORMAL LOW (ref 26.0–34.0)
MCHC: 33 g/dL (ref 30.0–36.0)
MCV: 77.4 fL — ABNORMAL LOW (ref 80.0–100.0)
Monocytes Absolute: 1.2 10*3/uL — ABNORMAL HIGH (ref 0.1–1.0)
Monocytes Relative: 6 %
Neutro Abs: 19.1 10*3/uL — ABNORMAL HIGH (ref 1.7–7.7)
Neutrophils Relative %: 87 %
Platelets: 451 10*3/uL — ABNORMAL HIGH (ref 150–400)
RBC: 5.01 MIL/uL (ref 4.22–5.81)
RDW: 15.2 % (ref 11.5–15.5)
WBC: 21.9 10*3/uL — ABNORMAL HIGH (ref 4.0–10.5)
nRBC: 0 % (ref 0.0–0.2)

## 2019-10-12 LAB — URINALYSIS, ROUTINE W REFLEX MICROSCOPIC
Bilirubin Urine: NEGATIVE
Glucose, UA: NEGATIVE mg/dL
Hgb urine dipstick: NEGATIVE
Ketones, ur: NEGATIVE mg/dL
Nitrite: POSITIVE — AB
Protein, ur: >=300 mg/dL — AB
Specific Gravity, Urine: 1.011 (ref 1.005–1.030)
pH: 8 (ref 5.0–8.0)

## 2019-10-12 LAB — COMPREHENSIVE METABOLIC PANEL
ALT: 33 U/L (ref 0–44)
AST: 33 U/L (ref 15–41)
Albumin: 3 g/dL — ABNORMAL LOW (ref 3.5–5.0)
Alkaline Phosphatase: 108 U/L (ref 38–126)
Anion gap: 12 (ref 5–15)
BUN: 37 mg/dL — ABNORMAL HIGH (ref 6–20)
CO2: 21 mmol/L — ABNORMAL LOW (ref 22–32)
Calcium: 8.8 mg/dL — ABNORMAL LOW (ref 8.9–10.3)
Chloride: 98 mmol/L (ref 98–111)
Creatinine, Ser: 1.63 mg/dL — ABNORMAL HIGH (ref 0.61–1.24)
GFR calc Af Amer: 55 mL/min — ABNORMAL LOW (ref >60)
GFR calc non Af Amer: 47 mL/min — ABNORMAL LOW (ref >60)
Glucose, Bld: 111 mg/dL — ABNORMAL HIGH (ref 70–99)
Potassium: 4.9 mmol/L (ref 3.5–5.1)
Sodium: 131 mmol/L — ABNORMAL LOW (ref 135–145)
Total Bilirubin: 0.6 mg/dL (ref 0.3–1.2)
Total Protein: 7.8 g/dL (ref 6.5–8.1)

## 2019-10-12 LAB — SARS CORONAVIRUS 2 BY RT PCR (HOSPITAL ORDER, PERFORMED IN ~~LOC~~ HOSPITAL LAB): SARS Coronavirus 2: NEGATIVE

## 2019-10-12 LAB — APTT: aPTT: 35 seconds (ref 24–36)

## 2019-10-12 LAB — LACTIC ACID, PLASMA
Lactic Acid, Venous: 2.3 mmol/L (ref 0.5–1.9)
Lactic Acid, Venous: 1.4 mmol/L (ref 0.5–1.9)

## 2019-10-12 LAB — PROTIME-INR
INR: 1.3 — ABNORMAL HIGH (ref 0.8–1.2)
Prothrombin Time: 15.2 seconds (ref 11.4–15.2)

## 2019-10-12 LAB — MRSA PCR SCREENING: MRSA by PCR: POSITIVE — AB

## 2019-10-12 MED ORDER — LACTULOSE 10 GM/15ML PO SOLN: 10.0000 g | Freq: Every day | ORAL | Status: DC | PRN

## 2019-10-12 MED ORDER — BUPROPION HCL ER (XL) 150 MG PO TB24
150.0000 mg | ORAL_TABLET | Freq: Every day | ORAL | Status: DC
  Administered 2019-10-12 – 2019-10-17 (×6): 150 mg via ORAL
  Filled 2019-10-12 (×6): qty 1

## 2019-10-12 MED ORDER — FLUTICASONE PROPIONATE 50 MCG/ACT NA SUSP
1.0000 | Freq: Every day | NASAL | Status: DC
  Administered 2019-10-12: 2 via NASAL
  Administered 2019-10-13 – 2019-10-16 (×4): 1 via NASAL
  Administered 2019-10-17: 2 via NASAL
  Filled 2019-10-12 (×2): qty 16

## 2019-10-12 MED ORDER — SODIUM CHLORIDE 0.9 % IV SOLN
2.0000 g | Freq: Once | INTRAVENOUS | Status: AC
  Administered 2019-10-12: 2 g via INTRAVENOUS
  Filled 2019-10-12: qty 2

## 2019-10-12 MED ORDER — HEPARIN SODIUM (PORCINE) 5000 UNIT/ML IJ SOLN
5000.0000 [IU] | Freq: Three times a day (TID) | INTRAMUSCULAR | Status: DC
  Administered 2019-10-12 – 2019-10-17 (×15): 5000 [IU] via SUBCUTANEOUS
  Filled 2019-10-12 (×15): qty 1

## 2019-10-12 MED ORDER — IBUPROFEN 400 MG PO TABS
400.0000 mg | ORAL_TABLET | Freq: Once | ORAL | Status: AC
  Administered 2019-10-12: 400 mg via ORAL
  Filled 2019-10-12: qty 1

## 2019-10-12 MED ORDER — ONDANSETRON HCL 4 MG/2ML IJ SOLN
4.0000 mg | Freq: Once | INTRAMUSCULAR | Status: AC
  Administered 2019-10-12: 4 mg via INTRAVENOUS
  Filled 2019-10-12: qty 2

## 2019-10-12 MED ORDER — SODIUM CHLORIDE 0.9 % IV BOLUS
1000.0000 mL | Freq: Once | INTRAVENOUS | Status: AC
  Administered 2019-10-12: 1000 mL via INTRAVENOUS

## 2019-10-12 MED ORDER — ADULT MULTIVITAMIN W/MINERALS CH
1.0000 | ORAL_TABLET | Freq: Every day | ORAL | Status: DC
  Administered 2019-10-12 – 2019-10-17 (×6): 1 via ORAL
  Filled 2019-10-12 (×6): qty 1

## 2019-10-12 MED ORDER — SODIUM CHLORIDE 0.9 % IV BOLUS
250.0000 mL | Freq: Once | INTRAVENOUS | Status: AC
  Administered 2019-10-12: 250 mL via INTRAVENOUS

## 2019-10-12 MED ORDER — SODIUM CHLORIDE 0.9 % IV BOLUS (SEPSIS)
1000.0000 mL | Freq: Once | INTRAVENOUS | Status: AC
  Administered 2019-10-12: 1000 mL via INTRAVENOUS

## 2019-10-12 MED ORDER — SODIUM CHLORIDE 0.9 % IV BOLUS: 1000.0000 mL | Freq: Once | INTRAVENOUS | Status: DC

## 2019-10-12 MED ORDER — SODIUM CHLORIDE 0.9 % IV SOLN: INTRAVENOUS | Status: DC

## 2019-10-12 MED ORDER — CRANBERRY 125 MG PO TABS: 1.0000 | ORAL_TABLET | Freq: Every day | ORAL | Status: DC

## 2019-10-12 MED ORDER — ACETAMINOPHEN 500 MG PO TABS
1000.0000 mg | ORAL_TABLET | Freq: Once | ORAL | Status: AC
  Administered 2019-10-12: 1000 mg via ORAL
  Filled 2019-10-12: qty 2

## 2019-10-12 MED ORDER — PANTOPRAZOLE SODIUM 20 MG PO TBEC
20.0000 mg | DELAYED_RELEASE_TABLET | Freq: Every day | ORAL | Status: DC
  Filled 2019-10-12 (×3): qty 1

## 2019-10-12 MED ORDER — LORATADINE 10 MG PO TABS
10.0000 mg | ORAL_TABLET | Freq: Every day | ORAL | Status: DC
  Administered 2019-10-12 – 2019-10-17 (×6): 10 mg via ORAL
  Filled 2019-10-12 (×6): qty 1

## 2019-10-12 MED ORDER — NOREPINEPHRINE 4 MG/250ML-% IV SOLN
0.0000 ug/min | INTRAVENOUS | Status: DC
  Administered 2019-10-12: 10 ug/min via INTRAVENOUS
  Administered 2019-10-13: 2 ug/min via INTRAVENOUS
  Administered 2019-10-13: 12 ug/min via INTRAVENOUS
  Filled 2019-10-12: qty 250

## 2019-10-12 MED ORDER — ORAL CARE MOUTH RINSE
15.0000 mL | Freq: Two times a day (BID) | OROMUCOSAL | Status: DC
  Administered 2019-10-12 – 2019-10-17 (×10): 15 mL via OROMUCOSAL

## 2019-10-12 MED ORDER — HYDROCORTISONE NA SUCCINATE PF 100 MG IJ SOLR
100.0000 mg | Freq: Three times a day (TID) | INTRAMUSCULAR | Status: DC
  Administered 2019-10-12 – 2019-10-13 (×2): 100 mg via INTRAVENOUS
  Filled 2019-10-12 (×2): qty 2

## 2019-10-12 MED ORDER — SODIUM CHLORIDE 0.9 % IV BOLUS
500.0000 mL | Freq: Once | INTRAVENOUS | Status: AC
  Administered 2019-10-13: 500 mL via INTRAVENOUS

## 2019-10-12 MED ORDER — NOREPINEPHRINE 4 MG/250ML-% IV SOLN
10.0000 ug/min | INTRAVENOUS | Status: DC
  Administered 2019-10-12: 10 ug/min via INTRAVENOUS

## 2019-10-12 MED ORDER — DOCUSATE SODIUM 100 MG PO CAPS
200.0000 mg | ORAL_CAPSULE | Freq: Two times a day (BID) | ORAL | Status: DC
  Administered 2019-10-12 – 2019-10-13 (×2): 200 mg via ORAL
  Filled 2019-10-12 (×2): qty 2

## 2019-10-12 MED ORDER — BACLOFEN 10 MG PO TABS
10.0000 mg | ORAL_TABLET | Freq: Three times a day (TID) | ORAL | Status: DC
  Administered 2019-10-12 – 2019-10-17 (×15): 10 mg via ORAL
  Filled 2019-10-12 (×15): qty 1

## 2019-10-12 MED ORDER — GABAPENTIN 300 MG PO CAPS
600.0000 mg | ORAL_CAPSULE | Freq: Three times a day (TID) | ORAL | Status: DC
  Administered 2019-10-12 – 2019-10-17 (×15): 600 mg via ORAL
  Filled 2019-10-12 (×15): qty 2

## 2019-10-12 MED ORDER — ALPRAZOLAM 0.5 MG PO TABS
0.5000 mg | ORAL_TABLET | Freq: Three times a day (TID) | ORAL | Status: DC | PRN
  Administered 2019-10-12 – 2019-10-16 (×2): 0.5 mg via ORAL
  Filled 2019-10-12 (×4): qty 1

## 2019-10-12 MED ORDER — SODIUM CHLORIDE 0.9 % IV SOLN
2.0000 g | Freq: Two times a day (BID) | INTRAVENOUS | Status: DC
  Administered 2019-10-13: 2 g via INTRAVENOUS
  Filled 2019-10-12: qty 2

## 2019-10-12 MED ORDER — NOREPINEPHRINE 4 MG/250ML-% IV SOLN
INTRAVENOUS | Status: AC
  Filled 2019-10-12: qty 250

## 2019-10-12 MED ORDER — POLYETHYLENE GLYCOL 3350 17 GM/SCOOP PO POWD
17.0000 g | Freq: Every day | ORAL | Status: DC | PRN
  Filled 2019-10-12: qty 255

## 2019-10-12 MED ORDER — POLYETHYLENE GLYCOL 3350 17 G PO PACK
17.0000 g | PACK | Freq: Every day | ORAL | Status: DC | PRN
  Administered 2019-10-16: 17 g via ORAL
  Filled 2019-10-12: qty 1

## 2019-10-12 MED ORDER — ASCORBIC ACID 500 MG PO TABS
1000.0000 mg | ORAL_TABLET | Freq: Every day | ORAL | Status: DC
  Administered 2019-10-12 – 2019-10-17 (×6): 1000 mg via ORAL
  Filled 2019-10-12 (×6): qty 2

## 2019-10-12 MED ORDER — SODIUM CHLORIDE 0.9 % IV BOLUS (SEPSIS)
250.0000 mL | Freq: Once | INTRAVENOUS | Status: AC
  Administered 2019-10-12: 250 mL via INTRAVENOUS

## 2019-10-12 MED ORDER — DOPAMINE-DEXTROSE 3.2-5 MG/ML-% IV SOLN
INTRAVENOUS | Status: AC
  Filled 2019-10-12: qty 250

## 2019-10-12 MED ORDER — BUPRENORPHINE HCL 150 MCG BU FILM: 1.0000 | ORAL_FILM | Freq: Two times a day (BID) | BUCCAL | Status: DC

## 2019-10-12 MED ORDER — OXYCODONE HCL 5 MG PO TABS: 5.0000 mg | ORAL_TABLET | Freq: Four times a day (QID) | ORAL | Status: DC | PRN

## 2019-10-12 MED ORDER — DOPAMINE-DEXTROSE 3.2-5 MG/ML-% IV SOLN: 5.0000 ug/kg/min | INTRAVENOUS | Status: DC

## 2019-10-12 NOTE — Progress Notes (Signed)
Pharmacy Antibiotic Note  [name] is a 55 y.o. male admitted on 10/12/2019 with sepsis and UTI.  Pharmacy has been consulted for Cefepime dosing.  Plan: Cefepime 2000 mg IV every 12 hours. Monitor labs, c/s, and patient improvement.  Height: 6\' 1"  (185.4 cm) Weight: 66.2 kg (146 lb) IBW/kg (Calculated) : 79.9  Temp (24hrs), Avg:100.6 F (38.1 C), Min:100.6 F (38.1 C), Max:100.6 F (38.1 C)  Recent Labs  Lab 10/12/19 1415  WBC 21.9*  CREATININE 1.63*  LATICACIDVEN 2.3*    Estimated Creatinine Clearance: 48.5 mL/min (A) (by C-G formula based on SCr of 1.63 mg/dL (H)).    Allergies  Allergen Reactions  . Niacin And Related Hives  . Other Hives    From work uniform  . Shellfish Allergy Swelling    Lip swelling    Antimicrobials this admission: Cefepime 7/7 >>     Dose adjustments this admission: Cefepime  Microbiology results: 7/7 BCx: pending 7/7 UCx: pending    Thank you for allowing pharmacy to be a part of this patient's care.  Ramond Craver 10/12/2019 4:16 PM

## 2019-10-12 NOTE — Progress Notes (Signed)
Pt blood pressure continued to be extremely hypotensive with systolic not increasing out of 50's and MAP in 40's. Paged Dr. Waldron Labs who advised to obtain additional IV access and give patient a 1L bolus and prepare to start dopamine and levo. No IV access available. Advised MD of no access and requested ED dr to start line for [place]. Dr. Ralene Bathe started triple PICC in right femoral. Pt educated and advised on need for the line and agreed to placement. Consent obtained. Pt tolerated procedure well.  Levo has been started and titrating; fluid bolus completing. Advised by Dr Waldron Labs to place new foley catheter as patient has only had 50 mL of output since arriving on the floor. Additional orders placed. Will continue to monitor patient.

## 2019-10-12 NOTE — ED Provider Notes (Signed)
I was asked by Dr. Waldron [facility] hospitalist to evaluate the patient in the ICU.  He is lost his peripheral access and pressures have dropped again.  He is asking for a central line to be placed.  I consented the patient for a right femoral central line explaining risks and benefits.  Patient is verbally agreed to procedure.  .Central Line  Date/Time: 10/12/2019 11:48 PM Performed by: Hayden Rasmussen, MD Authorized by: Hayden Rasmussen, MD   Consent:    Consent obtained:  Verbal   Consent given by:  Patient   Risks discussed:  Arterial puncture, bleeding, incorrect placement, nerve damage and infection   Alternatives discussed:  No treatment and delayed treatment Universal protocol:    Procedure explained and questions answered to patient or proxy's satisfaction: yes     Relevant documents present and verified: yes     Patient identity confirmed:  Verbally with patient Pre-procedure details:    Hand hygiene: Hand hygiene performed prior to insertion     Sterile barrier technique: All elements of maximal sterile technique followed     Skin preparation:  2% chlorhexidine   Skin preparation agent: Skin preparation agent completely dried prior to procedure   Anesthesia (see MAR for exact dosages):    Anesthesia method:  Local infiltration   Local anesthetic:  Lidocaine 1% w/o epi Procedure details:    Location:  R femoral   Patient position:  Flat   Procedural supplies:  Triple lumen   Catheter size:  7 Fr   Ultrasound guidance: yes     Sterile ultrasound techniques: Sterile gel and sterile probe covers were used     Number of attempts:  1   Successful placement: yes   Post-procedure details:    Post-procedure:  Dressing applied   Assessment:  Blood return through all ports and free fluid flow   Patient tolerance of procedure:  Tolerated well, no immediate complications Comments:     Patient in a low flow state and difficult to assess landmarks with ultrasound machine.   Ultimately vessel was punctured showing dark red nonpulsatile blood.  Likely venous but hospitalist will check VBG prior to utilizing.      Hayden Rasmussen, MD 10/13/19 [phone]

## 2019-10-12 NOTE — ED Triage Notes (Signed)
Pt from home and reports has had catheter in place since 2009. Pt reports most recent catheter change was a few days ago. Per EMS, pt reports headache, abdominal pain, nausea denies v/d for last several days.

## 2019-10-12 NOTE — Progress Notes (Signed)
Patient with septic shock, hypotensive with map in the low 40s, did not respond to fluid boluses, ED physician inserted emergent right femoral line, he will be started on Levophed, and stree steroids ,will continue with fluid boluses, discussed with ICU staff, they will change his Foley catheter, will obtain stat CT renal protocol once patient vitals are stable and can go to CT. Phillips Climes MD.
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
## 2019-10-12 NOTE — ED Notes (Signed)
Blood work obtained with IV start and blood culture x1. Sample labeled and taken to lab.
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
## 2019-10-13 ENCOUNTER — Inpatient Hospital Stay (HOSPITAL_COMMUNITY): Payer: Medicare Other

## 2019-10-13 DIAGNOSIS — Z978 Presence of other specified devices: Secondary | ICD-10-CM

## 2019-10-13 DIAGNOSIS — N200 Calculus of kidney: Secondary | ICD-10-CM

## 2019-10-13 LAB — BLOOD CULTURE ID PANEL (REFLEXED)

## 2019-10-13 LAB — HIV ANTIBODY (ROUTINE TESTING W REFLEX): HIV Screen 4th Generation wRfx: NONREACTIVE

## 2019-10-13 LAB — CBC
HCT: 31.4 % — ABNORMAL LOW (ref 39.0–52.0)
Hemoglobin: 10.1 g/dL — ABNORMAL LOW (ref 13.0–17.0)
MCH: 25.4 pg — ABNORMAL LOW (ref 26.0–34.0)
MCHC: 32.2 g/dL (ref 30.0–36.0)
MCV: 79.1 fL — ABNORMAL LOW (ref 80.0–100.0)
Platelets: 342 10*3/uL (ref 150–400)
RBC: 3.97 MIL/uL — ABNORMAL LOW (ref 4.22–5.81)
RDW: 15 % (ref 11.5–15.5)
WBC: 28.1 10*3/uL — ABNORMAL HIGH (ref 4.0–10.5)
nRBC: 0 % (ref 0.0–0.2)

## 2019-10-13 LAB — PROCALCITONIN
Procalcitonin: 26.67 ng/mL
Procalcitonin: 34.23 ng/mL

## 2019-10-13 LAB — BASIC METABOLIC PANEL
Anion gap: 10 (ref 5–15)
BUN: 30 mg/dL — ABNORMAL HIGH (ref 6–20)
CO2: 19 mmol/L — ABNORMAL LOW (ref 22–32)
Calcium: 7.9 mg/dL — ABNORMAL LOW (ref 8.9–10.3)
Chloride: 113 mmol/L — ABNORMAL HIGH (ref 98–111)
Creatinine, Ser: 1.48 mg/dL — ABNORMAL HIGH (ref 0.61–1.24)
GFR calc Af Amer: >60 mL/min (ref >60)
GFR calc non Af Amer: 53 mL/min — ABNORMAL LOW (ref >60)
Glucose, Bld: 123 mg/dL — ABNORMAL HIGH (ref 70–99)
Potassium: 4.5 mmol/L (ref 3.5–5.1)
Sodium: 142 mmol/L (ref 135–145)

## 2019-10-13 MED ORDER — PANTOPRAZOLE SODIUM 40 MG PO TBEC
40.0000 mg | DELAYED_RELEASE_TABLET | Freq: Every day | ORAL | Status: DC
  Administered 2019-10-13 – 2019-10-17 (×5): 40 mg via ORAL
  Filled 2019-10-13 (×5): qty 1

## 2019-10-13 MED ORDER — MUPIROCIN 2 % EX OINT
1.0000 "application " | TOPICAL_OINTMENT | Freq: Two times a day (BID) | CUTANEOUS | Status: DC
  Administered 2019-10-13 – 2019-10-17 (×9): 1 via NASAL
  Filled 2019-10-13 (×2): qty 22

## 2019-10-13 MED ORDER — SODIUM CHLORIDE 0.9 % IV SOLN
1.0000 g | Freq: Three times a day (TID) | INTRAVENOUS | Status: DC
  Administered 2019-10-13 – 2019-10-15 (×7): 1 g via INTRAVENOUS
  Filled 2019-10-13 (×7): qty 1

## 2019-10-13 MED ORDER — LACTULOSE 10 GM/15ML PO SOLN
10.0000 g | Freq: Every day | ORAL | Status: DC
  Administered 2019-10-13 – 2019-10-17 (×5): 10 g via ORAL
  Filled 2019-10-13 (×6): qty 30

## 2019-10-13 MED ORDER — HYDROCORTISONE NA SUCCINATE PF 100 MG IJ SOLR
50.0000 mg | Freq: Three times a day (TID) | INTRAMUSCULAR | Status: DC
  Administered 2019-10-13 – 2019-10-14 (×4): 50 mg via INTRAVENOUS
  Filled 2019-10-13 (×4): qty 2

## 2019-10-13 MED ORDER — LACTATED RINGERS IV BOLUS
1000.0000 mL | Freq: Once | INTRAVENOUS | Status: AC
  Administered 2019-10-13: 1000 mL via INTRAVENOUS

## 2019-10-13 MED ORDER — CHLORHEXIDINE GLUCONATE CLOTH 2 % EX PADS
6.0000 | MEDICATED_PAD | Freq: Every day | CUTANEOUS | Status: DC
  Administered 2019-10-13 – 2019-10-16 (×4): 6 via TOPICAL

## 2019-10-13 MED ORDER — CHLORHEXIDINE GLUCONATE CLOTH 2 % EX PADS: 6.0000 | MEDICATED_PAD | Freq: Every day | CUTANEOUS | Status: DC

## 2019-10-13 NOTE — Progress Notes (Signed)
OT Cancellation Note  Patient Details Name: SIVAN QUAST MRN: [id number] DOB: 05-Sep-1965   Cancelled Treatment:    Reason Eval/Treat Not Completed: Medical issues which prohibited therapy . Pt with femoral line placed last night, nsg request to hold therapy until removed. Will follow and evaluate when appropriate.   Guadelupe Sabin, OTR/L  [phone] 10/13/2019, 8:11 AM

## 2019-10-13 NOTE — Progress Notes (Signed)
CRITICAL VALUE ALERT  Critical Value:  Positive blood cultures: gram - rods in both anaerobic vials  Date & Time Notied:  10-13-19 0420  Provider Notified: Olevia Bowens  Orders Received/Actions taken: no new orders
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
## 2019-10-13 NOTE — Progress Notes (Signed)
PHARMACY - PHYSICIAN COMMUNICATION CRITICAL VALUE ALERT - BLOOD CULTURE IDENTIFICATION (BCID)  [name] is an 54 y.o. male who presented to [facility] on 10/12/2019 with a chief complaint of UTI/Sepsis.   Assessment:  1 of 2 blood cultures + for proteus species  Name of physician (or Provider) Contacted: Dr. Roxan Hockey  Current antibiotics: cefepime  Changes to prescribed antibiotics recommended:  meropenem 1g IV q8h until sensitivities back  Results for orders placed or performed during the hospital encounter of 10/12/19  Blood Culture ID Panel (Reflexed) (Collected: 10/12/2019  3:27 PM)  Result Value Ref Range   Enterococcus species NOT DETECTED NOT DETECTED   Listeria monocytogenes NOT DETECTED NOT DETECTED   Staphylococcus species NOT DETECTED NOT DETECTED   Staphylococcus aureus (BCID) NOT DETECTED NOT DETECTED   Streptococcus species NOT DETECTED NOT DETECTED   Streptococcus agalactiae NOT DETECTED NOT DETECTED   Streptococcus pneumoniae NOT DETECTED NOT DETECTED   Streptococcus pyogenes NOT DETECTED NOT DETECTED   Acinetobacter baumannii NOT DETECTED NOT DETECTED   Enterobacteriaceae species DETECTED (A) NOT DETECTED   Enterobacter cloacae complex NOT DETECTED NOT DETECTED   Escherichia coli NOT DETECTED NOT DETECTED   Klebsiella oxytoca NOT DETECTED NOT DETECTED   Klebsiella pneumoniae NOT DETECTED NOT DETECTED   Proteus species DETECTED (A) NOT DETECTED   Serratia marcescens NOT DETECTED NOT DETECTED   Carbapenem resistance NOT DETECTED NOT DETECTED   Haemophilus influenzae NOT DETECTED NOT DETECTED   Neisseria meningitidis NOT DETECTED NOT DETECTED   Pseudomonas aeruginosa NOT DETECTED NOT DETECTED   Candida albicans NOT DETECTED NOT DETECTED   Candida glabrata NOT DETECTED NOT DETECTED   Candida krusei NOT DETECTED NOT DETECTED   Candida parapsilosis NOT DETECTED NOT DETECTED   Candida tropicalis NOT DETECTED NOT DETECTED    Despina Pole 10/13/2019   11:54 AM

## 2019-10-13 NOTE — Progress Notes (Signed)
New foley placed. Hand hygiene performed, peri care and foley care performed prior to placement. Upon removing existing foley, a large kidney stone was attached to the foley catheter. As I removed existing foley, penis began bleeding and had exudate intermittently flowing. New foley was placed and immediately emptied 350 mL of yellow, sediment urine. Notified MD of kidney stone and urine output. Will continue to monitor patient.

## 2019-10-13 NOTE — Consult Note (Signed)
WOC Nurse Consult Note: Reason for Consult: Healed pressure injury to sacrum, full thickness, either Stage 3 or 4.  There are a few scattered areas of partial thickness skin loss with scant serous exudate here that can be managed with the application of a moisture barrier ointment. The area for which consultation was requested is the left lateral LE chronic wound where a full thickness is noted.  Photos of all other wounds are on the EMR.  Bedside RN Benjamine Mola to request a photo of this wound later today. Her assistance in obtaining wound measurements for me today is appreciated. Wound type: Pressure Pressure Injury POA: N/A Measurement: Healed sacral PI (either a stage 3 or 4 due to the presence of scarring) with affected area measuring 8cm x 4cm with scar tissue. There are two small partial thickness open areas that have no depth and scant serous drainage and another linear partial thickness area of skin injury at the left IT that is nearly healed. This will be treated with moisture barrier ointment and with turning and repositioning while in house.  While in ICU, the patient is on a mattress replacement with low air loss feature.  Orders have been provided for a mattress replacement to be obtained upon transfer to the floor.  Left lateral full thickness wound presents LE wound measures 8cm x 3cm x 0.2cm. Topical care guidance will be with a xeroform gauze wound contact layer topped with an ABD for padding and secured with a Kerlix roll gauze dressing, changed daily. Bilateral pressure redistribution heel boots have been ordered today to float heels and provide protective padding during the side to side positioning. The right foot wound measures 2cm round and is approximately 0.4cm deep.  We will ask Bedside RN to fill this defect with a gauze packing strip and cover with saline moistened gauze with daily changes. Wound bed: Please see photos attached to EMR taken yesterday and for the left LE,  today. Drainage (amount, consistency, odor) small to scant amount serous drainage on all wounds Periwound: Intact with evidence of previous wound healing Dressing procedure/placement/frequency: Guidance has been provided for Nursing for the care of the chronic, nonhealing wounds.  They are likely not related to the cause of admission.  [place] nursing team will not follow, but will remain available to this patient, the nursing and medical teams.  Please re-consult if needed. Thanks, Maudie Flakes, MSN, RN, [place], [facility]  Pager# [phone]

## 2019-10-13 NOTE — Progress Notes (Signed)
PT Cancellation Note  Patient Details Name: [name] MRN: [id number] DOB: September 29, 1965   Cancelled Treatment:    Reason Eval/Treat Not Completed: Medical issues which prohibited therapy.  Physical therapy held per RN due femoral line placement.  Will check back when femoral line removed.  10:30 AM, 10/13/19 Lonell Grandchild, MPT Physical Therapist with [facility] [phone] office [phone] mobile phone

## 2019-10-13 NOTE — Progress Notes (Signed)
Restarted Levo to maintain BP. His MAP is dipping below 65. Initiated at the 2 mcg. Pt is alert and oriented. Advised him that I was restarting med. Will continue to monitor patient.

## 2019-10-14 LAB — CBC
HCT: 28.5 % — ABNORMAL LOW (ref 39.0–52.0)
Hemoglobin: 9.3 g/dL — ABNORMAL LOW (ref 13.0–17.0)
MCH: 25.8 pg — ABNORMAL LOW (ref 26.0–34.0)
MCHC: 32.6 g/dL (ref 30.0–36.0)
MCV: 79.2 fL — ABNORMAL LOW (ref 80.0–100.0)
Platelets: 281 10*3/uL (ref 150–400)
RBC: 3.6 MIL/uL — ABNORMAL LOW (ref 4.22–5.81)
RDW: 15.3 % (ref 11.5–15.5)
WBC: 22.3 10*3/uL — ABNORMAL HIGH (ref 4.0–10.5)
nRBC: 0 % (ref 0.0–0.2)

## 2019-10-14 LAB — COMPREHENSIVE METABOLIC PANEL
ALT: 37 U/L (ref 0–44)
AST: 33 U/L (ref 15–41)
Albumin: 2.3 g/dL — ABNORMAL LOW (ref 3.5–5.0)
Alkaline Phosphatase: 91 U/L (ref 38–126)
Anion gap: 6 (ref 5–15)
BUN: 15 mg/dL (ref 6–20)
CO2: 23 mmol/L (ref 22–32)
Calcium: 8.5 mg/dL — ABNORMAL LOW (ref 8.9–10.3)
Chloride: 109 mmol/L (ref 98–111)
Creatinine, Ser: 0.57 mg/dL — ABNORMAL LOW (ref 0.61–1.24)
GFR calc Af Amer: >60 mL/min (ref >60)
GFR calc non Af Amer: >60 mL/min (ref >60)
Glucose, Bld: 110 mg/dL — ABNORMAL HIGH (ref 70–99)
Potassium: 3.8 mmol/L (ref 3.5–5.1)
Sodium: 138 mmol/L (ref 135–145)
Total Bilirubin: 0.4 mg/dL (ref 0.3–1.2)
Total Protein: 6.4 g/dL — ABNORMAL LOW (ref 6.5–8.1)

## 2019-10-14 MED ORDER — ALBUTEROL SULFATE (2.5 MG/3ML) 0.083% IN NEBU
2.5000 mg | INHALATION_SOLUTION | RESPIRATORY_TRACT | Status: DC | PRN
  Administered 2019-10-14 – 2019-10-16 (×3): 2.5 mg via RESPIRATORY_TRACT
  Filled 2019-10-14 (×3): qty 3

## 2019-10-14 MED ORDER — HYDROCORTISONE NA SUCCINATE PF 100 MG IJ SOLR
50.0000 mg | Freq: Two times a day (BID) | INTRAMUSCULAR | Status: DC
  Administered 2019-10-15 (×2): 50 mg via INTRAVENOUS
  Filled 2019-10-14 (×2): qty 2
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
## 2019-10-14 NOTE — Clinical Social Work Note (Signed)
Patient active with [facility] RN. Recommended for HHPT. Will need HH RN/PT orders upon at d/c.   [name], Clydene Pugh, LCSW
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
## 2019-10-14 NOTE — Plan of Care (Addendum)
  Problem: Acute Rehab PT Goals(only PT should resolve) Goal: Pt Will Go Supine/Side To Sit 10/14/2019 0906 by Lonell Grandchild, PT Outcome: Progressing Flowsheets (Taken 10/14/2019 0906) Pt will go Supine/Side to Sit: . with moderate assist . with maximum assist  10/14/2019 0906 by Lonell Grandchild, PT Outcome: Progressing 10/14/2019 0903 by Lonell Grandchild, PT Outcome: Progressing Flowsheets (Taken 10/14/2019 0903) Pt will go Supine/Side to Sit: . with moderate assist . with maximum assist    Problem: Acute Rehab PT Goals(only PT should resolve) Goal: Pt Will Transfer Bed To Chair/Chair To Bed 10/14/2019 0906 by Lonell Grandchild, PT Outcome: Progressing 10/14/2019 0903 by Lonell Grandchild, PT Outcome: Progressing Flowsheets (Taken 10/14/2019 0903) Pt will Transfer Bed to Chair/Chair to Bed: . with max assist . total assist   Problem: Acute Rehab PT Goals(only PT should resolve) Goal: Patient Will Perform Sitting Balance Outcome: Progressing  9:08 AM, 10/14/19 Lonell Grandchild, MPT Physical Therapist with [facility] 336 [phone] office [phone] mobile phone  Flowsheets (Taken 10/14/2019 [phone]) Patient will perform sitting balance: with modified independence

## 2019-10-14 NOTE — Care Management Important Message (Signed)
Important Message  Patient Details  Name: [name] MRN: [id number] Date of Birth: 1965-04-30   Medicare Important Message Given:  Yes     [name] 10/14/2019, 4:13 PM

## 2019-10-15 DIAGNOSIS — G8250 Quadriplegia, unspecified: Secondary | ICD-10-CM

## 2019-10-15 DIAGNOSIS — S14109S Unspecified injury at unspecified level of cervical spinal cord, sequela: Secondary | ICD-10-CM

## 2019-10-15 DIAGNOSIS — L89152 Pressure ulcer of sacral region, stage 2: Secondary | ICD-10-CM | POA: Diagnosis present

## 2019-10-15 LAB — CBC
HCT: 28.2 % — ABNORMAL LOW (ref 39.0–52.0)
Hemoglobin: 9.3 g/dL — ABNORMAL LOW (ref 13.0–17.0)
MCH: 26.1 pg (ref 26.0–34.0)
MCHC: 33 g/dL (ref 30.0–36.0)
MCV: 79 fL — ABNORMAL LOW (ref 80.0–100.0)
Platelets: 321 10*3/uL (ref 150–400)
RBC: 3.57 MIL/uL — ABNORMAL LOW (ref 4.22–5.81)
RDW: 15.2 % (ref 11.5–15.5)
WBC: 21 10*3/uL — ABNORMAL HIGH (ref 4.0–10.5)
nRBC: 0 % (ref 0.0–0.2)

## 2019-10-15 LAB — URINE CULTURE: Culture: >=100000 — AB

## 2019-10-15 LAB — BASIC METABOLIC PANEL
Anion gap: 9 (ref 5–15)
BUN: 16 mg/dL (ref 6–20)
CO2: 21 mmol/L — ABNORMAL LOW (ref 22–32)
Calcium: 8.5 mg/dL — ABNORMAL LOW (ref 8.9–10.3)
Chloride: 109 mmol/L (ref 98–111)
Creatinine, Ser: 0.48 mg/dL — ABNORMAL LOW (ref 0.61–1.24)
GFR calc Af Amer: >60 mL/min (ref >60)
GFR calc non Af Amer: >60 mL/min (ref >60)
Glucose, Bld: 96 mg/dL (ref 70–99)
Potassium: 3.5 mmol/L (ref 3.5–5.1)
Sodium: 139 mmol/L (ref 135–145)

## 2019-10-15 MED ORDER — BISACODYL 10 MG RE SUPP
10.0000 mg | Freq: Once | RECTAL | Status: AC
  Administered 2019-10-15: 10 mg via RECTAL
  Filled 2019-10-15: qty 1

## 2019-10-15 MED ORDER — SODIUM CHLORIDE 0.9 % IV SOLN
2.0000 g | INTRAVENOUS | Status: DC
  Administered 2019-10-15 – 2019-10-16 (×2): 2 g via INTRAVENOUS
  Filled 2019-10-15 (×2): qty 20

## 2019-10-15 MED ORDER — BUPRENORPHINE HCL 150 MCG BU FILM
150.0000 ug | ORAL_FILM | Freq: Two times a day (BID) | BUCCAL | Status: DC
  Administered 2019-10-15 – 2019-10-17 (×4): 150 ug via SUBLINGUAL

## 2019-10-15 NOTE — Progress Notes (Signed)
[facility] night shift.  The patient is on low dose buprenorphine from home. He takes Belbuca 150 mcg BID. We do not have it as formulary in the pharmacy and only have the 2 mg available at the pharmacy. He will be allowed to take his own home supply while in the hospital.  Colin Must, MD
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
## 2019-10-16 LAB — CULTURE, BLOOD (ROUTINE X 2)
Special Requests: ADEQUATE
Special Requests: ADEQUATE

## 2019-10-16 LAB — COMPREHENSIVE METABOLIC PANEL WITH GFR
ALT: 29 U/L (ref 0–44)
AST: 18 U/L (ref 15–41)
Albumin: 2 g/dL — ABNORMAL LOW (ref 3.5–5.0)
Alkaline Phosphatase: 77 U/L (ref 38–126)
Anion gap: 7 (ref 5–15)
BUN: 13 mg/dL (ref 6–20)
CO2: 26 mmol/L (ref 22–32)
Calcium: 8.4 mg/dL — ABNORMAL LOW (ref 8.9–10.3)
Chloride: 108 mmol/L (ref 98–111)
Creatinine, Ser: 0.42 mg/dL — ABNORMAL LOW (ref 0.61–1.24)
GFR calc Af Amer: >60 mL/min
GFR calc non Af Amer: >60 mL/min
Glucose, Bld: 84 mg/dL (ref 70–99)
Potassium: 3 mmol/L — ABNORMAL LOW (ref 3.5–5.1)
Sodium: 141 mmol/L (ref 135–145)
Total Bilirubin: 0.3 mg/dL (ref 0.3–1.2)
Total Protein: 5.8 g/dL — ABNORMAL LOW (ref 6.5–8.1)

## 2019-10-16 LAB — CBC
HCT: 28.9 % — ABNORMAL LOW (ref 39.0–52.0)
Hemoglobin: 9.4 g/dL — ABNORMAL LOW (ref 13.0–17.0)
MCH: 25.6 pg — ABNORMAL LOW (ref 26.0–34.0)
MCHC: 32.5 g/dL (ref 30.0–36.0)
MCV: 78.7 fL — ABNORMAL LOW (ref 80.0–100.0)
Platelets: 342 10*3/uL (ref 150–400)
RBC: 3.67 MIL/uL — ABNORMAL LOW (ref 4.22–5.81)
RDW: 15.1 % (ref 11.5–15.5)
WBC: 15.9 10*3/uL — ABNORMAL HIGH (ref 4.0–10.5)
nRBC: 0 % (ref 0.0–0.2)

## 2019-10-16 MED ORDER — SORBITOL 70 % SOLN
960.0000 mL | TOPICAL_OIL | Freq: Once | ORAL | Status: AC
  Administered 2019-10-16: 960 mL via RECTAL
  Filled 2019-10-16: qty 473

## 2019-10-16 MED ORDER — POTASSIUM CHLORIDE CRYS ER 20 MEQ PO TBCR
40.0000 meq | EXTENDED_RELEASE_TABLET | ORAL | Status: AC
  Administered 2019-10-16 (×2): 40 meq via ORAL
  Filled 2019-10-16 (×2): qty 2

## 2019-10-16 MED ORDER — AMOXICILLIN-POT CLAVULANATE 875-125 MG PO TABS
1.0000 | ORAL_TABLET | Freq: Two times a day (BID) | ORAL | Status: DC
  Administered 2019-10-16 – 2019-10-17 (×2): 1 via ORAL
  Filled 2019-10-16 (×2): qty 1
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
## 2019-10-17 LAB — CBC
HCT: 30.5 % — ABNORMAL LOW (ref 39.0–52.0)
Hemoglobin: 10 g/dL — ABNORMAL LOW (ref 13.0–17.0)
MCH: 25.5 pg — ABNORMAL LOW (ref 26.0–34.0)
MCHC: 32.8 g/dL (ref 30.0–36.0)
MCV: 77.8 fL — ABNORMAL LOW (ref 80.0–100.0)
Platelets: 332 10*3/uL (ref 150–400)
RBC: 3.92 MIL/uL — ABNORMAL LOW (ref 4.22–5.81)
RDW: 15 % (ref 11.5–15.5)
WBC: 15.2 10*3/uL — ABNORMAL HIGH (ref 4.0–10.5)
nRBC: 0.1 % (ref 0.0–0.2)

## 2019-10-17 LAB — BASIC METABOLIC PANEL
Anion gap: 9 (ref 5–15)
BUN: 10 mg/dL (ref 6–20)
CO2: 27 mmol/L (ref 22–32)
Calcium: 8.5 mg/dL — ABNORMAL LOW (ref 8.9–10.3)
Chloride: 102 mmol/L (ref 98–111)
Creatinine, Ser: 0.39 mg/dL — ABNORMAL LOW (ref 0.61–1.24)
GFR calc Af Amer: >60 mL/min (ref >60)
GFR calc non Af Amer: >60 mL/min (ref >60)
Glucose, Bld: 86 mg/dL (ref 70–99)
Potassium: 3.5 mmol/L (ref 3.5–5.1)
Sodium: 138 mmol/L (ref 135–145)

## 2019-10-17 LAB — MAGNESIUM: Magnesium: 1.7 mg/dL (ref 1.7–2.4)

## 2019-10-17 MED ORDER — SENNOSIDES-DOCUSATE SODIUM 8.6-50 MG PO TABS: 2.0000 | ORAL_TABLET | Freq: Every day | ORAL | 11 refills | Status: AC

## 2019-10-17 MED ORDER — POTASSIUM CHLORIDE CRYS ER 20 MEQ PO TBCR
40.0000 meq | EXTENDED_RELEASE_TABLET | Freq: Once | ORAL | Status: AC
  Administered 2019-10-17: 40 meq via ORAL
  Filled 2019-10-17: qty 2

## 2019-10-17 MED ORDER — MAGNESIUM SULFATE 2 GM/50ML IV SOLN
2.0000 g | Freq: Once | INTRAVENOUS | Status: AC
  Administered 2019-10-17: 2 g via INTRAVENOUS
  Filled 2019-10-17: qty 50

## 2019-10-17 MED ORDER — PANTOPRAZOLE SODIUM 40 MG PO TBEC: 40.0000 mg | DELAYED_RELEASE_TABLET | Freq: Every day | ORAL | 2 refills | Status: AC

## 2019-10-17 MED ORDER — BISACODYL 10 MG RE SUPP: 10.0000 mg | RECTAL | 0 refills | Status: DC

## 2019-10-17 MED ORDER — CEPHALEXIN 500 MG PO CAPS: 500.0000 mg | ORAL_CAPSULE | Freq: Three times a day (TID) | ORAL | 0 refills | Status: AC

## 2019-10-17 NOTE — Discharge Summary (Signed)
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 infections. RESULT CALLED TO, READ BACK BY AND VERIFIED WITH: TETREAULT,H [id number] ON 7.7.21 BY ISLEY,B Performed at [facility], [address], [place], [place] [address]     Today   Subjective    Tyan Dy today has no new complaints No fevers or chills, no nausea vomiting or diarrhea          Patient has been seen and examined prior to discharge   Objective   Blood pressure 122/83, pulse 88, temperature 98.2 F (36.8 C), temperature source Oral, resp. rate 18, height 6' (1.829 m), weight 75.2 kg, SpO2 96 %.   Intake/Output Summary (Last 24 hours) at 10/17/2019 1344 Last data filed at 10/17/2019 0600 Gross per 24 hour  Intake 3553.03 ml  Output 3850 ml  Net  -296.97 ml    Exam Gen:- Awake Alert,  In no apparent distress  HEENT:- [place].AT, No sclera icterus Neck-Supple Neck,No JVD,.  Healed prior tracheostomy scar Lungs-  CTAB , fair symmetrical air movement CV- S1, S2 normal, regular  Abd-  +ve B.Sounds, Abd Soft, No tenderness, healed prior PEG tube scar    Extremity - No  edema, pedal pulses present Psych-affect is appropriate, oriented x3 Neuro-chronic neuromuscular deficits consistent with patient's spinal cord injury , no new focal deficits, no tremors GU-chronic indwelling Foley Skin --chronic decubitus ulcers of the feet and sacrum/buttocks--- please see photos in epic   Data Review   CBC w Diff:  Lab Results  Component Value Date   WBC 15.2 (H) 10/17/2019   HGB 10.0 (L) 10/17/2019   HCT 30.5 (L) 10/17/2019   PLT 332 10/17/2019   LYMPHOPCT 4 10/12/2019   MONOPCT 6 10/12/2019   EOSPCT 0 10/12/2019   BASOPCT 0 10/12/2019    CMP:  Lab Results  Component Value Date   NA 138 10/17/2019   K 3.5 10/17/2019   CL 102 10/17/2019   CO2 27 10/17/2019   BUN 10 10/17/2019   CREATININE 0.39 (L) 10/17/2019   PROT 5.8 (L) 10/16/2019   ALBUMIN 2.0 (L) 10/16/2019   BILITOT 0.3 10/16/2019   ALKPHOS 77 10/16/2019   AST 18 10/16/2019   ALT 29 10/16/2019  .   Total Discharge time is about 33 minutes  Roxan Hockey M.D on 10/17/2019 at 1:44 PM  Go to [email or web address] -  for contact info  [facility] - Office  [phone]

## 2019-10-17 NOTE — Care Management Important Message (Signed)
Important Message  Patient Details  Name: [name] MRN: [id number] Date of Birth: [birth year]   Medicare Important Message Given:  Yes     [name] 10/17/2019, 1:26 PM

## 2019-10-17 NOTE — Progress Notes (Signed)
Linda with [facility] messaged advising of d/c as patient is active with [facility].  EMS convalescent transport contacted.    Bronda Alfred, Clydene Pugh, LCSW

## 2019-10-17 NOTE — Discharge Instructions (Signed)
1)Avoid Dehydration 2)Change Foley catheter at least once every 4 weeks 3)Outpatient follow-up with urologist advised -Needs wound care

## 2019-10-17 NOTE — Plan of Care (Signed)
Adequate for discharge.

## 2019-10-17 NOTE — Progress Notes (Signed)
Nsg Discharge Note  Admit Date:  10/12/2019 Discharge date: 10/17/2019   [name] to be D/C'd Home per MD order.  AVS completed.  Copy for chart, and copy for patient signed, and dated. Patient/caregiver able to verbalize understanding.  Discharge Medication: Allergies as of 10/17/2019      Reactions   Niacin And Related Hives   Other Hives   From work uniform   Shellfish Allergy Swelling   Lip swelling      Medication List    STOP taking these medications   clindamycin 150 MG capsule Commonly known as: CLEOCIN   docusate sodium 100 MG capsule Commonly known as: COLACE     TAKE these medications   ALPRAZolam 0.5 MG tablet Commonly known as: XANAX Take 1 tablet by mouth 3 (three) times daily as needed for anxiety.   baclofen 10 MG tablet Commonly known as: LIORESAL Take 10 mg by mouth 3 (three) times daily.   Belbuca 150 MCG Film Generic drug: Buprenorphine HCl Place 1 Film under the tongue 2 (two) times daily.   bisacodyl 10 MG suppository Commonly known as: Dulcolax Place 1 suppository (10 mg total) rectally every Friday. Start taking on: October 21, 2019   buPROPion 150 MG 24 hr tablet Commonly known as: WELLBUTRIN XL Take 150 mg by mouth daily.   cephALEXin 500 MG capsule Commonly known as: Keflex Take 1 capsule (500 mg total) by mouth 3 (three) times daily for 5 days.   cetirizine 10 MG tablet Commonly known as: ZYRTEC Take 10 mg by mouth daily.   Cranberry 125 MG Tabs Take 1 tablet by mouth daily.   diclofenac Sodium 1 % Gel Commonly known as: VOLTAREN Apply 1 application topically 4 (four) times daily as needed (for pain).   fluticasone 50 MCG/ACT nasal spray Commonly known as: FLONASE Place 1-2 sprays into both nostrils daily.   gabapentin 600 MG tablet Commonly known as: NEURONTIN Take 600 mg by mouth 3 (three) times daily.   lactulose 10 GM/15ML solution Commonly known as: CHRONULAC Take 10 g by mouth daily as needed for mild  constipation or moderate constipation.   multivitamin with minerals Tabs tablet Take 1 tablet by mouth daily.   pantoprazole 40 MG tablet Commonly known as: PROTONIX Take 1 tablet (40 mg total) by mouth daily. Start taking on: October 18, 2019 What changed:   medication strength  how much to take   polyethylene glycol powder 17 GM/SCOOP powder Commonly known as: GLYCOLAX/MIRALAX Take 17 g by mouth daily as needed for mild constipation or moderate constipation.   senna-docusate 8.6-50 MG tablet Commonly known as: Senokot-S Take 2 tablets by mouth at bedtime.   vitamin C with rose hips 1000 MG tablet Take 1 tablet by mouth daily.            Discharge Care Instructions  (From admission, onward)         Start     Ordered   10/17/19 0000  Discharge wound care:       Comments: As advised   10/17/19 1339          Discharge Assessment: Vitals:   10/17/19 0628 10/17/19 1404  BP: 122/83 (!) 135/92  Pulse: 88 (!) 104  Resp: 18 20  Temp: 98.2 F (36.8 C) 99.1 F (37.3 C)  SpO2: 96% 99%   Skin clean, dry and intact without evidence of skin break down, no evidence of skin tears noted. IV catheter discontinued intact. Site without signs and symptoms  of complications - no redness or edema noted at insertion site, patient denies c/o pain - only slight tenderness at site.  Dressing with slight pressure applied.  D/c Instructions-Education: Discharge instructions given to patient/family with verbalized understanding. D/c education completed with patient/family including follow up instructions, medication list, d/c activities limitations if indicated, with other d/c instructions as indicated by MD - patient able to verbalize understanding, all questions fully answered. Patient instructed to return to ED, call 911, or call MD for any changes in condition.  Patient escorted via [place], and D/C home via private auto.  Dorcas Mcmurray, LPN 1/61/0960 4:54 PM

## 2019-10-22 LAB — CALCULI, WITH PHOTOGRAPH (CLINICAL LAB)
Calcium Oxalate Monohydrate: 40 %
Carbonate Apatite: 30 %
Mg NH4 PO4 (Struvite): 30 %
Weight Calculi: 545 mg

## 2019-11-01 ENCOUNTER — Emergency Department (HOSPITAL_COMMUNITY): Payer: Medicare Other

## 2019-11-01 ENCOUNTER — Inpatient Hospital Stay (HOSPITAL_COMMUNITY)
Admission: EM | Admit: 2019-11-01 | Discharge: 2019-11-06 | DRG: 698 | Disposition: A | Discharge status: Home Health | Payer: Medicare Other | Attending: Family Medicine | Admitting: Family Medicine

## 2019-11-01 ENCOUNTER — Encounter (HOSPITAL_COMMUNITY): Payer: Self-pay

## 2019-11-01 ENCOUNTER — Other Ambulatory Visit: Payer: Self-pay

## 2019-11-01 DIAGNOSIS — I959 Hypotension, unspecified: Secondary | ICD-10-CM | POA: Diagnosis present

## 2019-11-01 DIAGNOSIS — K592 Neurogenic bowel, not elsewhere classified: Secondary | ICD-10-CM | POA: Diagnosis present

## 2019-11-01 DIAGNOSIS — N319 Neuromuscular dysfunction of bladder, unspecified: Secondary | ICD-10-CM | POA: Diagnosis present

## 2019-11-01 DIAGNOSIS — T83511A Infection and inflammatory reaction due to indwelling urethral catheter, initial encounter: Principal | ICD-10-CM | POA: Diagnosis present

## 2019-11-01 DIAGNOSIS — Z981 Arthrodesis status: Secondary | ICD-10-CM

## 2019-11-01 DIAGNOSIS — S14109S Unspecified injury at unspecified level of cervical spinal cord, sequela: Secondary | ICD-10-CM | POA: Diagnosis present

## 2019-11-01 DIAGNOSIS — D649 Anemia, unspecified: Secondary | ICD-10-CM | POA: Diagnosis present

## 2019-11-01 DIAGNOSIS — G909 Disorder of the autonomic nervous system, unspecified: Secondary | ICD-10-CM | POA: Diagnosis present

## 2019-11-01 DIAGNOSIS — L89890 Pressure ulcer of other site, unstageable: Secondary | ICD-10-CM | POA: Diagnosis present

## 2019-11-01 DIAGNOSIS — S14155S Other incomplete lesion at C5 level of cervical spinal cord, sequela: Secondary | ICD-10-CM

## 2019-11-01 DIAGNOSIS — N200 Calculus of kidney: Secondary | ICD-10-CM

## 2019-11-01 DIAGNOSIS — Y846 Urinary catheterization as the cause of abnormal reaction of the patient, or of later complication, without mention of misadventure at the time of the procedure: Secondary | ICD-10-CM | POA: Diagnosis present

## 2019-11-01 DIAGNOSIS — N12 Tubulo-interstitial nephritis, not specified as acute or chronic: Secondary | ICD-10-CM | POA: Diagnosis not present

## 2019-11-01 DIAGNOSIS — I9589 Other hypotension: Secondary | ICD-10-CM | POA: Diagnosis present

## 2019-11-01 DIAGNOSIS — B964 Proteus (mirabilis) (morganii) as the cause of diseases classified elsewhere: Secondary | ICD-10-CM | POA: Diagnosis present

## 2019-11-01 DIAGNOSIS — S12400S Unspecified displaced fracture of fifth cervical vertebra, sequela: Secondary | ICD-10-CM

## 2019-11-01 DIAGNOSIS — Z79899 Other long term (current) drug therapy: Secondary | ICD-10-CM

## 2019-11-01 DIAGNOSIS — Z978 Presence of other specified devices: Secondary | ICD-10-CM

## 2019-11-01 DIAGNOSIS — G8250 Quadriplegia, unspecified: Secondary | ICD-10-CM | POA: Diagnosis present

## 2019-11-01 DIAGNOSIS — Z7401 Bed confinement status: Secondary | ICD-10-CM

## 2019-11-01 DIAGNOSIS — A498 Other bacterial infections of unspecified site: Secondary | ICD-10-CM | POA: Diagnosis present

## 2019-11-01 DIAGNOSIS — L89150 Pressure ulcer of sacral region, unstageable: Secondary | ICD-10-CM | POA: Diagnosis present

## 2019-11-01 DIAGNOSIS — K219 Gastro-esophageal reflux disease without esophagitis: Secondary | ICD-10-CM | POA: Diagnosis present

## 2019-11-01 DIAGNOSIS — E860 Dehydration: Secondary | ICD-10-CM | POA: Diagnosis present

## 2019-11-01 DIAGNOSIS — N390 Urinary tract infection, site not specified: Secondary | ICD-10-CM | POA: Diagnosis present

## 2019-11-01 DIAGNOSIS — Z20822 Contact with and (suspected) exposure to covid-19: Secondary | ICD-10-CM | POA: Diagnosis present

## 2019-11-01 DIAGNOSIS — Z888 Allergy status to other drugs, medicaments and biological substances status: Secondary | ICD-10-CM

## 2019-11-01 DIAGNOSIS — B965 Pseudomonas (aeruginosa) (mallei) (pseudomallei) as the cause of diseases classified elsewhere: Secondary | ICD-10-CM | POA: Diagnosis present

## 2019-11-01 DIAGNOSIS — Z993 Dependence on wheelchair: Secondary | ICD-10-CM

## 2019-11-01 DIAGNOSIS — F419 Anxiety disorder, unspecified: Secondary | ICD-10-CM | POA: Diagnosis present

## 2019-11-01 LAB — COMPREHENSIVE METABOLIC PANEL
ALT: 17 U/L (ref 0–44)
AST: 14 U/L — ABNORMAL LOW (ref 15–41)
Albumin: 3.5 g/dL (ref 3.5–5.0)
Alkaline Phosphatase: 84 U/L (ref 38–126)
Anion gap: 9 (ref 5–15)
BUN: 47 mg/dL — ABNORMAL HIGH (ref 6–20)
CO2: 19 mmol/L — ABNORMAL LOW (ref 22–32)
Calcium: 9.4 mg/dL (ref 8.9–10.3)
Chloride: 102 mmol/L (ref 98–111)
Creatinine, Ser: 0.74 mg/dL (ref 0.61–1.24)
GFR calc Af Amer: >60 mL/min (ref >60)
GFR calc non Af Amer: >60 mL/min (ref >60)
Glucose, Bld: 89 mg/dL (ref 70–99)
Potassium: 4 mmol/L (ref 3.5–5.1)
Sodium: 130 mmol/L — ABNORMAL LOW (ref 135–145)
Total Bilirubin: 0.4 mg/dL (ref 0.3–1.2)
Total Protein: 8.2 g/dL — ABNORMAL HIGH (ref 6.5–8.1)

## 2019-11-01 LAB — CBC WITH DIFFERENTIAL/PLATELET
Abs Immature Granulocytes: 0.02 10*3/uL (ref 0.00–0.07)
Basophils Absolute: 0.1 10*3/uL (ref 0.0–0.1)
Basophils Relative: 1 %
Eosinophils Absolute: 1 10*3/uL — ABNORMAL HIGH (ref 0.0–0.5)
Eosinophils Relative: 14 %
HCT: 33.7 % — ABNORMAL LOW (ref 39.0–52.0)
Hemoglobin: 11 g/dL — ABNORMAL LOW (ref 13.0–17.0)
Immature Granulocytes: 0 %
Lymphocytes Relative: 27 %
Lymphs Abs: 2 10*3/uL (ref 0.7–4.0)
MCH: 25.3 pg — ABNORMAL LOW (ref 26.0–34.0)
MCHC: 32.6 g/dL (ref 30.0–36.0)
MCV: 77.5 fL — ABNORMAL LOW (ref 80.0–100.0)
Monocytes Absolute: 0.7 10*3/uL (ref 0.1–1.0)
Monocytes Relative: 9 %
Neutro Abs: 3.7 10*3/uL (ref 1.7–7.7)
Neutrophils Relative %: 49 %
Platelets: 538 10*3/uL — ABNORMAL HIGH (ref 150–400)
RBC: 4.35 MIL/uL (ref 4.22–5.81)
RDW: 15 % (ref 11.5–15.5)
WBC: 7.6 10*3/uL (ref 4.0–10.5)
nRBC: 0 % (ref 0.0–0.2)

## 2019-11-01 LAB — URINALYSIS, ROUTINE W REFLEX MICROSCOPIC
Bilirubin Urine: NEGATIVE
Glucose, UA: NEGATIVE mg/dL
Ketones, ur: NEGATIVE mg/dL
Nitrite: NEGATIVE
Protein, ur: 100 mg/dL — AB
Specific Gravity, Urine: 1.011 (ref 1.005–1.030)
pH: 6 (ref 5.0–8.0)

## 2019-11-01 LAB — TYPE AND SCREEN
ABO/RH(D): A POS
Antibody Screen: NEGATIVE

## 2019-11-01 LAB — TROPONIN I (HIGH SENSITIVITY)
Troponin I (High Sensitivity): 6 ng/L (ref <18)
Troponin I (High Sensitivity): 5 ng/L (ref <18)

## 2019-11-01 LAB — PROCALCITONIN: Procalcitonin: <0.1 ng/mL

## 2019-11-01 LAB — LACTIC ACID, PLASMA: Lactic Acid, Venous: 0.7 mmol/L (ref 0.5–1.9)

## 2019-11-01 LAB — PROTIME-INR
INR: 1.1 (ref 0.8–1.2)
Prothrombin Time: 14.2 seconds (ref 11.4–15.2)

## 2019-11-01 LAB — SARS CORONAVIRUS 2 BY RT PCR (HOSPITAL ORDER, PERFORMED IN ~~LOC~~ HOSPITAL LAB): SARS Coronavirus 2: NEGATIVE

## 2019-11-01 MED ORDER — PANTOPRAZOLE SODIUM 40 MG PO TBEC
40.0000 mg | DELAYED_RELEASE_TABLET | Freq: Every day | ORAL | Status: DC
  Administered 2019-11-02 – 2019-11-06 (×5): 40 mg via ORAL
  Filled 2019-11-01 (×5): qty 1

## 2019-11-01 MED ORDER — BUPRENORPHINE HCL 150 MCG BU FILM: 150.0000 ug | ORAL_FILM | Freq: Once | BUCCAL | Status: DC

## 2019-11-01 MED ORDER — ACETAMINOPHEN 325 MG PO TABS
650.0000 mg | ORAL_TABLET | Freq: Four times a day (QID) | ORAL | Status: DC | PRN
  Administered 2019-11-01: 650 mg via ORAL
  Filled 2019-11-01: qty 2

## 2019-11-01 MED ORDER — BACLOFEN 10 MG PO TABS
10.0000 mg | ORAL_TABLET | Freq: Three times a day (TID) | ORAL | Status: DC
  Administered 2019-11-02 – 2019-11-06 (×14): 10 mg via ORAL
  Filled 2019-11-01 (×14): qty 1

## 2019-11-01 MED ORDER — BISACODYL 10 MG RE SUPP
10.0000 mg | RECTAL | Status: DC
  Administered 2019-11-04: 10 mg via RECTAL
  Filled 2019-11-01 (×2): qty 1

## 2019-11-01 MED ORDER — SODIUM CHLORIDE 0.9 % IV BOLUS
1000.0000 mL | Freq: Once | INTRAVENOUS | Status: AC
  Administered 2019-11-01: 1000 mL via INTRAVENOUS

## 2019-11-01 MED ORDER — BUPRENORPHINE HCL 150 MCG BU FILM
1.0000 | ORAL_FILM | Freq: Two times a day (BID) | BUCCAL | Status: DC
  Administered 2019-11-02 – 2019-11-04 (×5): 1 via SUBLINGUAL
  Filled 2019-11-01 (×8): qty 1

## 2019-11-01 MED ORDER — ENOXAPARIN SODIUM 40 MG/0.4ML ~~LOC~~ SOLN
40.0000 mg | SUBCUTANEOUS | Status: DC
  Administered 2019-11-02 – 2019-11-06 (×5): 40 mg via SUBCUTANEOUS
  Filled 2019-11-01 (×5): qty 0.4

## 2019-11-01 MED ORDER — LACTULOSE 10 GM/15ML PO SOLN: 10.0000 g | Freq: Every day | ORAL | Status: DC | PRN

## 2019-11-01 MED ORDER — ALPRAZOLAM 0.5 MG PO TABS
0.5000 mg | ORAL_TABLET | Freq: Three times a day (TID) | ORAL | Status: DC | PRN
  Administered 2019-11-02: 0.5 mg via ORAL
  Filled 2019-11-01: qty 1

## 2019-11-01 MED ORDER — ONDANSETRON HCL 4 MG/2ML IJ SOLN: 4.0000 mg | Freq: Four times a day (QID) | INTRAMUSCULAR | Status: DC | PRN

## 2019-11-01 MED ORDER — SODIUM CHLORIDE 0.9 % IV SOLN: INTRAVENOUS | Status: AC

## 2019-11-01 MED ORDER — SODIUM CHLORIDE 0.9 % IV SOLN
1.0000 g | INTRAVENOUS | Status: DC
  Administered 2019-11-02: 1 g via INTRAVENOUS
  Filled 2019-11-01: qty 10

## 2019-11-01 MED ORDER — GABAPENTIN 600 MG PO TABS
600.0000 mg | ORAL_TABLET | Freq: Three times a day (TID) | ORAL | Status: DC
  Filled 2019-11-01 (×2): qty 1

## 2019-11-01 MED ORDER — ACETAMINOPHEN 650 MG RE SUPP: 650.0000 mg | Freq: Four times a day (QID) | RECTAL | Status: DC | PRN

## 2019-11-01 MED ORDER — ONDANSETRON HCL 4 MG PO TABS: 4.0000 mg | ORAL_TABLET | Freq: Four times a day (QID) | ORAL | Status: DC | PRN

## 2019-11-01 MED ORDER — SODIUM CHLORIDE 0.9 % IV SOLN
1.0000 g | Freq: Once | INTRAVENOUS | Status: AC
  Administered 2019-11-01: 1 g via INTRAVENOUS
  Filled 2019-11-01: qty 10

## 2019-11-01 MED ORDER — GABAPENTIN 300 MG PO CAPS
600.0000 mg | ORAL_CAPSULE | Freq: Three times a day (TID) | ORAL | Status: DC
  Administered 2019-11-02 – 2019-11-06 (×14): 600 mg via ORAL
  Filled 2019-11-01 (×14): qty 2

## 2019-11-01 MED ORDER — BUPROPION HCL ER (XL) 150 MG PO TB24
150.0000 mg | ORAL_TABLET | Freq: Every day | ORAL | Status: DC
  Administered 2019-11-02 – 2019-11-06 (×5): 150 mg via ORAL
  Filled 2019-11-01 (×5): qty 1
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
## 2019-11-01 NOTE — ED Triage Notes (Signed)
Per [facility]- Pt called EMS for hypotension and sepsis. Pt seen here two weeks ago for sepsis and UTI. Initial BP 84/60 per EMS.   Pt vitals upon arrival to ED WDL. BP 120/86.  Pt A&O.

## 2019-11-02 DIAGNOSIS — N319 Neuromuscular dysfunction of bladder, unspecified: Secondary | ICD-10-CM | POA: Diagnosis present

## 2019-11-02 DIAGNOSIS — L89150 Pressure ulcer of sacral region, unstageable: Secondary | ICD-10-CM | POA: Diagnosis present

## 2019-11-02 DIAGNOSIS — Z888 Allergy status to other drugs, medicaments and biological substances status: Secondary | ICD-10-CM | POA: Diagnosis not present

## 2019-11-02 DIAGNOSIS — Z7401 Bed confinement status: Secondary | ICD-10-CM | POA: Diagnosis not present

## 2019-11-02 DIAGNOSIS — Y846 Urinary catheterization as the cause of abnormal reaction of the patient, or of later complication, without mention of misadventure at the time of the procedure: Secondary | ICD-10-CM | POA: Diagnosis present

## 2019-11-02 DIAGNOSIS — Z978 Presence of other specified devices: Secondary | ICD-10-CM | POA: Diagnosis not present

## 2019-11-02 DIAGNOSIS — B964 Proteus (mirabilis) (morganii) as the cause of diseases classified elsewhere: Secondary | ICD-10-CM | POA: Diagnosis present

## 2019-11-02 DIAGNOSIS — I959 Hypotension, unspecified: Secondary | ICD-10-CM | POA: Diagnosis not present

## 2019-11-02 DIAGNOSIS — Z79899 Other long term (current) drug therapy: Secondary | ICD-10-CM | POA: Diagnosis not present

## 2019-11-02 DIAGNOSIS — B965 Pseudomonas (aeruginosa) (mallei) (pseudomallei) as the cause of diseases classified elsewhere: Secondary | ICD-10-CM | POA: Diagnosis present

## 2019-11-02 DIAGNOSIS — N200 Calculus of kidney: Secondary | ICD-10-CM | POA: Diagnosis present

## 2019-11-02 DIAGNOSIS — Z981 Arthrodesis status: Secondary | ICD-10-CM | POA: Diagnosis not present

## 2019-11-02 DIAGNOSIS — N390 Urinary tract infection, site not specified: Secondary | ICD-10-CM | POA: Diagnosis not present

## 2019-11-02 DIAGNOSIS — I9589 Other hypotension: Secondary | ICD-10-CM | POA: Diagnosis present

## 2019-11-02 DIAGNOSIS — K219 Gastro-esophageal reflux disease without esophagitis: Secondary | ICD-10-CM | POA: Diagnosis present

## 2019-11-02 DIAGNOSIS — T83511A Infection and inflammatory reaction due to indwelling urethral catheter, initial encounter: Secondary | ICD-10-CM | POA: Diagnosis present

## 2019-11-02 DIAGNOSIS — G8250 Quadriplegia, unspecified: Secondary | ICD-10-CM | POA: Diagnosis present

## 2019-11-02 DIAGNOSIS — S14155S Other incomplete lesion at C5 level of cervical spinal cord, sequela: Secondary | ICD-10-CM | POA: Diagnosis not present

## 2019-11-02 DIAGNOSIS — N12 Tubulo-interstitial nephritis, not specified as acute or chronic: Secondary | ICD-10-CM | POA: Diagnosis present

## 2019-11-02 DIAGNOSIS — S12400S Unspecified displaced fracture of fifth cervical vertebra, sequela: Secondary | ICD-10-CM | POA: Diagnosis not present

## 2019-11-02 DIAGNOSIS — K592 Neurogenic bowel, not elsewhere classified: Secondary | ICD-10-CM | POA: Diagnosis present

## 2019-11-02 DIAGNOSIS — F419 Anxiety disorder, unspecified: Secondary | ICD-10-CM | POA: Diagnosis present

## 2019-11-02 DIAGNOSIS — G909 Disorder of the autonomic nervous system, unspecified: Secondary | ICD-10-CM | POA: Diagnosis present

## 2019-11-02 DIAGNOSIS — L89890 Pressure ulcer of other site, unstageable: Secondary | ICD-10-CM | POA: Diagnosis present

## 2019-11-02 DIAGNOSIS — E860 Dehydration: Secondary | ICD-10-CM | POA: Diagnosis present

## 2019-11-02 DIAGNOSIS — Z20822 Contact with and (suspected) exposure to covid-19: Secondary | ICD-10-CM | POA: Diagnosis present

## 2019-11-02 DIAGNOSIS — D649 Anemia, unspecified: Secondary | ICD-10-CM | POA: Diagnosis present

## 2019-11-02 DIAGNOSIS — Z993 Dependence on wheelchair: Secondary | ICD-10-CM | POA: Diagnosis not present

## 2019-11-02 LAB — CBC
HCT: 36.6 % — ABNORMAL LOW (ref 39.0–52.0)
Hemoglobin: 11.7 g/dL — ABNORMAL LOW (ref 13.0–17.0)
MCH: 25 pg — ABNORMAL LOW (ref 26.0–34.0)
MCHC: 32 g/dL (ref 30.0–36.0)
MCV: 78.2 fL — ABNORMAL LOW (ref 80.0–100.0)
Platelets: 546 10*3/uL — ABNORMAL HIGH (ref 150–400)
RBC: 4.68 MIL/uL (ref 4.22–5.81)
RDW: 15 % (ref 11.5–15.5)
WBC: 7.4 10*3/uL (ref 4.0–10.5)
nRBC: 0 % (ref 0.0–0.2)

## 2019-11-02 LAB — GLUCOSE, CAPILLARY: Glucose-Capillary: 94 mg/dL (ref 70–99)

## 2019-11-02 LAB — BASIC METABOLIC PANEL
Anion gap: 10 (ref 5–15)
BUN: 36 mg/dL — ABNORMAL HIGH (ref 6–20)
CO2: 22 mmol/L (ref 22–32)
Calcium: 9.6 mg/dL (ref 8.9–10.3)
Chloride: 105 mmol/L (ref 98–111)
Creatinine, Ser: 0.67 mg/dL (ref 0.61–1.24)
GFR calc Af Amer: >60 mL/min (ref >60)
GFR calc non Af Amer: >60 mL/min (ref >60)
Glucose, Bld: 91 mg/dL (ref 70–99)
Potassium: 4.3 mmol/L (ref 3.5–5.1)
Sodium: 137 mmol/L (ref 135–145)

## 2019-11-02 MED ORDER — SODIUM CHLORIDE 0.9 % IV SOLN: INTRAVENOUS | Status: AC

## 2019-11-02 MED ORDER — ALBUMIN HUMAN 5 % IV SOLN
12.5000 g | Freq: Once | INTRAVENOUS | Status: AC
  Administered 2019-11-02: 12.5 g via INTRAVENOUS
  Filled 2019-11-02 (×2): qty 250

## 2019-11-02 MED ORDER — CHLORHEXIDINE GLUCONATE CLOTH 2 % EX PADS
6.0000 | MEDICATED_PAD | Freq: Every day | CUTANEOUS | Status: DC
  Administered 2019-11-02 – 2019-11-05 (×4): 6 via TOPICAL

## 2019-11-02 MED ORDER — MIDODRINE HCL 5 MG PO TABS
10.0000 mg | ORAL_TABLET | Freq: Three times a day (TID) | ORAL | Status: DC
  Administered 2019-11-02 – 2019-11-03 (×3): 10 mg via ORAL
  Filled 2019-11-02 (×4): qty 2

## 2019-11-02 MED ORDER — SODIUM CHLORIDE 0.9 % IV BOLUS
250.0000 mL | Freq: Once | INTRAVENOUS | Status: AC
  Administered 2019-11-02: 250 mL via INTRAVENOUS

## 2019-11-02 NOTE — Care Management Obs Status (Signed)
[facility] NOTIFICATION   Patient Details  Name: [name] MRN: [id number] Date of Birth: November 12, 1965   Medicare Observation Status Notification Given:  Yes    [name] 11/02/2019, 4:22 PM

## 2019-11-02 NOTE — TOC Progression Note (Signed)
Transition of Care [facility]) - Progression Note    Patient Details  Name: [name] MRN: [id number] Date of Birth: April 07, 1966  Transition of [facility]) CM/SW Contact  Salome Arnt, [place] Phone Number: 11/02/2019, 12:10 PM  Clinical Narrative:  LCSW notified that pt's wife was requesting update on pt. LCSW called Joelene Millin who reports that this was from last night when she was unsure if pt was going to be admitted or not. LCSW updated Joelene Millin that MD talked to pt and pt's daughter at bedside this morning. TOC will continue to follow.            Expected Discharge Plan and Services           Expected Discharge Date: 11/04/19                                     Social Determinants of Health (SDOH) Interventions    Readmission Risk Interventions No flowsheet data found.
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
## 2019-11-02 NOTE — Progress Notes (Signed)
MEWS change due to decrease in BP. Notified charge nurse and MD. No increase in vitals due to expected change per MD. Elodia Florence RN 11/02/2019 @1150 

## 2019-11-02 NOTE — Consult Note (Addendum)
WOC Nurse Consult Note: Reason for Consult:Chronic nonhealing sacral wound Bilateral feet wounds . Wound type:nonhealing chronic wounds.  Pressure Injury POA: Yes Measurement: sacrum left upper buttocks:  3 cm x 3.5 cm x 0.5 cm with palpable bone and chronic osteomyelitis  Right lateral foot:  1.5 cm round Left lateral foot:  1 cmx 2 cm x 0.1 cm  Wound bed: ruddy red Drainage (amount, consistency, odor) minimal serosanguinous   Periwound:scarring from previous healed wounds.  Dressing procedure/placement/frequency:Cleanse wounds to bilateral feet and sacrum with NS and pat dry.  Apply Xeroform to wound beds and cover with dry dressing and kerlix/tape as appropriate to secure. CHange daily.  Will not follow at this time.  Please re-consult if needed.  Domenic Moras MSN, RN, FNP-BC CWON Wound, Ostomy, Continence Nurse Pager [phone]

## 2019-11-03 DIAGNOSIS — A498 Other bacterial infections of unspecified site: Secondary | ICD-10-CM | POA: Diagnosis present

## 2019-11-03 LAB — CORTISOL-AM, BLOOD: Cortisol - AM: 13.3 ug/dL (ref 6.7–22.6)

## 2019-11-03 MED ORDER — MIDODRINE HCL 5 MG PO TABS
5.0000 mg | ORAL_TABLET | Freq: Three times a day (TID) | ORAL | Status: DC
  Administered 2019-11-03 – 2019-11-06 (×8): 5 mg via ORAL
  Filled 2019-11-03 (×8): qty 1

## 2019-11-03 MED ORDER — SODIUM CHLORIDE 0.9 % IV SOLN
2.0000 g | Freq: Three times a day (TID) | INTRAVENOUS | Status: DC
  Administered 2019-11-03 – 2019-11-05 (×7): 2 g via INTRAVENOUS
  Filled 2019-11-03 (×7): qty 2
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
## 2019-11-03 NOTE — Progress Notes (Signed)
   11/02/19 2159  Assess: MEWS Score  Temp 98.5 F (36.9 C)  BP (!) 78/50 (told rn bp low)  Pulse Rate 89  Resp 20  SpO2 100 %  O2 Device Room Air  Assess: MEWS Score  MEWS Temp 0  MEWS Systolic 2  MEWS Pulse 0  MEWS RR 0  MEWS LOC 0  MEWS Score 2  MEWS Score Color Yellow  Assess: if the MEWS score is Yellow or Red  Were vital signs taken at a resting state? Yes  Focused Assessment No change from prior assessment  Early Detection of Sepsis Score *See Row Information* Low

## 2019-11-03 NOTE — Progress Notes (Signed)
Mid-level paged about low BP. Orders received to give Albumin. Will continue to monitor.

## 2019-11-04 DIAGNOSIS — N200 Calculus of kidney: Secondary | ICD-10-CM

## 2019-11-04 DIAGNOSIS — Z978 Presence of other specified devices: Secondary | ICD-10-CM

## 2019-11-04 LAB — URINE CULTURE: Culture: >=100000 — AB
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
## 2019-11-04 NOTE — Care Management Important Message (Signed)
Important Message  Patient Details  Name: [name] MRN: [id number] Date of Birth: 04-02-1966   Medicare Important Message Given:  Yes     [name] 11/04/2019, 3:17 PM

## 2019-11-05 DIAGNOSIS — G8250 Quadriplegia, unspecified: Secondary | ICD-10-CM

## 2019-11-05 DIAGNOSIS — S14109S Unspecified injury at unspecified level of cervical spinal cord, sequela: Secondary | ICD-10-CM

## 2019-11-05 DIAGNOSIS — N390 Urinary tract infection, site not specified: Secondary | ICD-10-CM

## 2019-11-05 DIAGNOSIS — A498 Other bacterial infections of unspecified site: Secondary | ICD-10-CM

## 2019-11-05 LAB — CBC
HCT: 32.5 % — ABNORMAL LOW (ref 39.0–52.0)
Hemoglobin: 10.3 g/dL — ABNORMAL LOW (ref 13.0–17.0)
MCH: 24.9 pg — ABNORMAL LOW (ref 26.0–34.0)
MCHC: 31.7 g/dL (ref 30.0–36.0)
MCV: 78.7 fL — ABNORMAL LOW (ref 80.0–100.0)
Platelets: 409 10*3/uL — ABNORMAL HIGH (ref 150–400)
RBC: 4.13 MIL/uL — ABNORMAL LOW (ref 4.22–5.81)
RDW: 14.8 % (ref 11.5–15.5)
WBC: 7.3 10*3/uL (ref 4.0–10.5)
nRBC: 0 % (ref 0.0–0.2)

## 2019-11-05 LAB — BASIC METABOLIC PANEL
Anion gap: 9 (ref 5–15)
BUN: 15 mg/dL (ref 6–20)
CO2: 21 mmol/L — ABNORMAL LOW (ref 22–32)
Calcium: 9.5 mg/dL (ref 8.9–10.3)
Chloride: 106 mmol/L (ref 98–111)
Creatinine, Ser: 0.52 mg/dL — ABNORMAL LOW (ref 0.61–1.24)
GFR calc Af Amer: >60 mL/min (ref >60)
GFR calc non Af Amer: >60 mL/min (ref >60)
Glucose, Bld: 94 mg/dL (ref 70–99)
Potassium: 3.7 mmol/L (ref 3.5–5.1)
Sodium: 136 mmol/L (ref 135–145)

## 2019-11-05 MED ORDER — CIPROFLOXACIN HCL 250 MG PO TABS
500.0000 mg | ORAL_TABLET | Freq: Once | ORAL | Status: AC
  Administered 2019-11-05: 500 mg via ORAL
  Filled 2019-11-05: qty 2

## 2019-11-05 MED ORDER — CIPROFLOXACIN HCL 500 MG PO TABS: 500.0000 mg | ORAL_TABLET | Freq: Two times a day (BID) | ORAL | 0 refills | Status: AC

## 2019-11-05 MED ORDER — MIDODRINE HCL 5 MG PO TABS: 5.0000 mg | ORAL_TABLET | Freq: Three times a day (TID) | ORAL | 2 refills | Status: AC

## 2019-11-05 NOTE — TOC Transition Note (Signed)
Transition of Care [facility]) - CM/SW Discharge Note   Patient Details  Name: [name] MRN: [id number] Date of Birth: 1965/07/27  [facility]) CM/SW Contact:  Natasha Bence, LCSW Phone Number: 11/05/2019, 12:15 PM   Clinical Narrative:    [facility] reported that patient's upcoming discharge had been reported to [facility] with [facility] and needed [facility] services had been scheduled. CSW notified [facility] of patient's discharge. [facility] agreeable to provided Home Health services upon discharge. TOC signing off.      Barriers to Discharge: Continued Medical Work up   Patient Goals and CMS Choice Patient states their goals for this hospitalization and ongoing recovery are:: return home      Discharge Placement                       Discharge Plan and Services In-house Referral: Clinical Social Work   Post Acute Care Choice: [place]: RN [facility] Agency: [place] ([place]) Date [place]: 11/02/19 Time [place]: 1600 Representative spoke with at [place]: [place] ([place]) Interventions     Readmission Risk Interventions No flowsheet data found.
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
## 2019-11-05 NOTE — Discharge Instructions (Signed)
1)Please take ciprofloxacin antibiotic 500 mg twice a day for 7 days as prescribed for urinary tract infection 2)Please follow-up with local urologist in 1 to 2 weeks to discuss management of recurrent kidney stones, chronic indwelling Foley catheter on recurrent urinary tract infections 3)Please take midodrine 5 mg 3 times a day as prescribed to hopefully keep your blood pressure from dropping too low 4)Cleanse wounds to bilateral feet and sacrum with NS and pat dry.  Apply Xeroform to wound beds and cover with dry dressing and kerlix/tape as appropriate to secure. CHange daily. 5) Home health registered nurse from advanced care home agency will, help you with Wound care and  Foley catheter care

## 2019-11-06 MED ORDER — CIPROFLOXACIN HCL 250 MG PO TABS
500.0000 mg | ORAL_TABLET | Freq: Once | ORAL | Status: AC
  Administered 2019-11-06: 500 mg via ORAL
  Filled 2019-11-06: qty 2
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
## 2019-11-06 NOTE — Progress Notes (Signed)
Nsg Discharge Note  Admit Date:  11/01/2019 Discharge date: 11/06/2019   [name] to be D/C'd Home Via [facility] per MD order.  AVS completed.  Copy for chart, and copy for patient signed, and dated. Patient/caregiver able to verbalize understanding.  Discharge Medication: Allergies as of 11/06/2019      Reactions   Niacin And Related Hives   Other Hives   From work uniform   Shellfish Allergy Swelling   Lip swelling      Medication List    TAKE these medications   ALPRAZolam 0.5 MG tablet Commonly known as: XANAX Take 1 tablet by mouth 3 (three) times daily as needed for anxiety.   baclofen 10 MG tablet Commonly known as: LIORESAL Take 10 mg by mouth 3 (three) times daily.   Belbuca 150 MCG Film Generic drug: Buprenorphine HCl Place 1 Film under the tongue 2 (two) times daily.   bisacodyl 10 MG suppository Commonly known as: Dulcolax Place 1 suppository (10 mg total) rectally every Friday.   buPROPion 150 MG 24 hr tablet Commonly known as: WELLBUTRIN XL Take 150 mg by mouth daily.   cetirizine 10 MG tablet Commonly known as: ZYRTEC Take 10 mg by mouth daily.   ciprofloxacin 500 MG tablet Commonly known as: CIPRO Take 1 tablet (500 mg total) by mouth 2 (two) times daily for 7 days.   Cranberry 125 MG Tabs Take 1 tablet by mouth daily.   diclofenac Sodium 1 % Gel Commonly known as: VOLTAREN Apply 1 application topically 4 (four) times daily as needed (for pain).   fluticasone 50 MCG/ACT nasal spray Commonly known as: FLONASE Place 1-2 sprays into both nostrils daily.   gabapentin 600 MG tablet Commonly known as: NEURONTIN Take 600 mg by mouth 3 (three) times daily.   lactulose 10 GM/15ML solution Commonly known as: CHRONULAC Take 10 g by mouth daily as needed for mild constipation or moderate constipation.   midodrine 5 MG tablet Commonly known as: PROAMATINE Take 1 tablet (5 mg total) by mouth 3 (three) times daily with meals.   multivitamin  with minerals Tabs tablet Take 1 tablet by mouth daily.   pantoprazole 40 MG tablet Commonly known as: PROTONIX Take 1 tablet (40 mg total) by mouth daily.   polyethylene glycol powder 17 GM/SCOOP powder Commonly known as: GLYCOLAX/MIRALAX Take 17 g by mouth daily as needed for mild constipation or moderate constipation.   senna-docusate 8.6-50 MG tablet Commonly known as: Senokot-S Take 2 tablets by mouth at bedtime.   vitamin C with rose hips 1000 MG tablet Take 1 tablet by mouth daily.            Discharge Care Instructions  (From admission, onward)         Start     Ordered   11/05/19 0000  Discharge wound care:       Comments: Cleanse wounds to bilateral feet and sacrum with NS and pat dry.  Apply Xeroform to wound beds and cover with dry dressing and kerlix/tape as appropriate to secure. CHange daily.   11/05/19 1301          Discharge Assessment: Vitals:   11/05/19 2211 11/06/19 0521  BP: 113/71 (!) 140/96  Pulse: 98 92  Resp: 18 16  Temp: 98.8 F (37.1 C) 98.3 F (36.8 C)  SpO2: 99% 99%   Skin clean, dry and intact without evidence of skin break down, no evidence of skin tears noted. IV catheter discontinued intact. Site without signs  and symptoms of complications - no redness or edema noted at insertion site, patient denies c/o pain - only slight tenderness at site.  Dressing with slight pressure applied.  D/c Instructions-Education: Discharge instructions given to patient/family with verbalized understanding. D/c education completed with patient/family including follow up instructions, medication list, d/c activities limitations if indicated, with other d/c instructions as indicated by MD - patient able to verbalize understanding, all questions fully answered. Patient instructed to return to ED, call 911, or call MD for any changes in condition.  Patient escorted via [place], and D/C home via private auto.  Zenaida Deed, RN 11/06/2019 10:02 AM

## 2019-11-06 NOTE — Progress Notes (Signed)
   Patient was discharged on 11/05/2019----apparently EMS was unable to pick patient up until a.m. of 11/06/2019  --- I arrived the unit to evaluate patient on 11/06/2019 only to be told that EMS has already picked patient up this morning -Patient is no longer in his room  =-So patient was neither seen nor evaluated by [facility] on 11/06/2019  Roxan Hockey, MD

## 2019-11-07 LAB — CULTURE, BLOOD (ROUTINE X 2)
Culture: NO GROWTH
Culture: NO GROWTH
Special Requests: ADEQUATE
Special Requests: ADEQUATE

## 2019-11-08 ENCOUNTER — Ambulatory Visit: Payer: Medicare Other | Admitting: Urology

## 2019-11-14 ENCOUNTER — Other Ambulatory Visit: Payer: Self-pay

## 2019-11-14 ENCOUNTER — Ambulatory Visit (INDEPENDENT_AMBULATORY_CARE_PROVIDER_SITE_OTHER): Payer: Medicare Other | Admitting: Urology

## 2019-11-14 ENCOUNTER — Ambulatory Visit: Payer: Medicare Other | Admitting: Urology

## 2019-11-14 ENCOUNTER — Encounter: Payer: Self-pay | Admitting: Urology

## 2019-11-14 VITALS — BP 86/57 | HR 109 | Temp 99.1°F

## 2019-11-14 DIAGNOSIS — N200 Calculus of kidney: Secondary | ICD-10-CM

## 2019-11-14 DIAGNOSIS — N210 Calculus in bladder: Secondary | ICD-10-CM | POA: Diagnosis not present
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
## 2019-11-14 NOTE — Patient Instructions (Signed)
Your surgery will be scheduled for August 16th at [place] will do cystoscopy with Right retrograde right ureteroscopy with laser and cystolitholapaxy.  [facility] will call you with your pre op appointment and your arrival time for surgery. If you have any questions please call me at [phone].

## 2019-11-14 NOTE — Progress Notes (Signed)
Urological Symptom Review  Patient is experiencing the following symptoms: Chronic foley catheter History kidney stones   Review of Systems  Gastrointestinal (upper)  : Negative for upper GI symptoms  Gastrointestinal (lower) : Negative for lower GI symptoms  Constitutional : Negative for symptoms  Skin: Negative for skin symptoms  Eyes: Negative for eye symptoms  Ear/Nose/Throat : Negative for Ear/Nose/Throat symptoms  Hematologic/Lymphatic: Negative for Hematologic/Lymphatic symptoms  Cardiovascular : Negative for cardiovascular symptoms  Respiratory : Negative for respiratory symptoms  Endocrine: Negative for endocrine symptoms  Musculoskeletal: Patient is a quadraplegic  Neurological: Negative for neurological symptoms  Psychologic: Negative for psychiatric symptoms
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
## 2019-11-15 ENCOUNTER — Encounter: Payer: Self-pay | Admitting: Urology

## 2019-11-15 ENCOUNTER — Encounter (HOSPITAL_COMMUNITY): Payer: Self-pay

## 2019-11-17 ENCOUNTER — Other Ambulatory Visit: Payer: Self-pay

## 2019-11-17 ENCOUNTER — Encounter (HOSPITAL_COMMUNITY)
Admission: RE | Admit: 2019-11-17 | Discharge: 2019-11-17 | Disposition: A | Discharge status: Home / Self Care | Payer: Medicare Other | Source: Ambulatory Visit | Attending: Urology | Admitting: Urology

## 2019-11-17 ENCOUNTER — Other Ambulatory Visit (HOSPITAL_COMMUNITY)
Admission: RE | Admit: 2019-11-17 | Discharge: 2019-11-17 | Disposition: A | Discharge status: Home / Self Care | Payer: Medicare Other | Source: Ambulatory Visit | Attending: Urology | Admitting: Urology

## 2019-11-17 DIAGNOSIS — Z01812 Encounter for preprocedural laboratory examination: Secondary | ICD-10-CM | POA: Diagnosis not present

## 2019-11-17 DIAGNOSIS — Z20822 Contact with and (suspected) exposure to covid-19: Secondary | ICD-10-CM | POA: Diagnosis not present

## 2019-11-17 HISTORY — DX: Other complications of anesthesia, initial encounter: T88.59XA

## 2019-11-17 LAB — SARS CORONAVIRUS 2 (TAT 6-24 HRS): SARS Coronavirus 2: NEGATIVE

## 2019-11-21 ENCOUNTER — Ambulatory Visit (HOSPITAL_COMMUNITY): Payer: Medicare Other | Admitting: Anesthesiology

## 2019-11-21 ENCOUNTER — Ambulatory Visit (HOSPITAL_COMMUNITY): Payer: Medicare Other

## 2019-11-21 ENCOUNTER — Encounter (HOSPITAL_COMMUNITY)
Admission: RE | Disposition: A | Discharge status: Home / Self Care | Payer: Self-pay | Source: Home / Self Care | Attending: Urology

## 2019-11-21 ENCOUNTER — Ambulatory Visit (HOSPITAL_COMMUNITY)
Admission: RE | Admit: 2019-11-21 | Discharge: 2019-11-21 | Disposition: A | Discharge status: Home / Self Care | Payer: Medicare Other | Attending: Urology | Admitting: Urology

## 2019-11-21 DIAGNOSIS — Z79899 Other long term (current) drug therapy: Secondary | ICD-10-CM | POA: Diagnosis not present

## 2019-11-21 DIAGNOSIS — K219 Gastro-esophageal reflux disease without esophagitis: Secondary | ICD-10-CM | POA: Diagnosis not present

## 2019-11-21 DIAGNOSIS — N200 Calculus of kidney: Secondary | ICD-10-CM | POA: Diagnosis not present

## 2019-11-21 DIAGNOSIS — M1990 Unspecified osteoarthritis, unspecified site: Secondary | ICD-10-CM | POA: Insufficient documentation

## 2019-11-21 DIAGNOSIS — G8250 Quadriplegia, unspecified: Secondary | ICD-10-CM | POA: Insufficient documentation

## 2019-11-21 DIAGNOSIS — N210 Calculus in bladder: Secondary | ICD-10-CM | POA: Insufficient documentation

## 2019-11-21 DIAGNOSIS — Z888 Allergy status to other drugs, medicaments and biological substances status: Secondary | ICD-10-CM | POA: Insufficient documentation

## 2019-11-21 HISTORY — PX: CYSTOSCOPY WITH RETROGRADE PYELOGRAM, URETEROSCOPY AND STENT PLACEMENT: SHX5789

## 2019-11-21 SURGERY — CYSTOURETEROSCOPY, WITH RETROGRADE PYELOGRAM AND STENT INSERTION
Anesthesia: General | Laterality: Right

## 2019-11-21 MED ORDER — PHENYLEPHRINE 40 MCG/ML (10ML) SYRINGE FOR IV PUSH (FOR BLOOD PRESSURE SUPPORT)
PREFILLED_SYRINGE | INTRAVENOUS | Status: DC | PRN
  Administered 2019-11-21 (×2): 80 ug via INTRAVENOUS
  Administered 2019-11-21: 40 ug via INTRAVENOUS
  Administered 2019-11-21: 80 ug via INTRAVENOUS
  Administered 2019-11-21: 120 ug via INTRAVENOUS

## 2019-11-21 MED ORDER — PHENYLEPHRINE HCL (PRESSORS) 10 MG/ML IV SOLN
INTRAVENOUS | Status: AC
  Filled 2019-11-21: qty 1

## 2019-11-21 MED ORDER — ROCURONIUM BROMIDE 10 MG/ML (PF) SYRINGE
PREFILLED_SYRINGE | INTRAVENOUS | Status: AC
  Filled 2019-11-21: qty 10

## 2019-11-21 MED ORDER — DIATRIZOATE MEGLUMINE 30 % UR SOLN
URETHRAL | Status: DC | PRN
  Administered 2019-11-21: 10 mL via URETHRAL

## 2019-11-21 MED ORDER — LIDOCAINE HCL (CARDIAC) PF 100 MG/5ML IV SOSY
PREFILLED_SYRINGE | INTRAVENOUS | Status: DC | PRN
  Administered 2019-11-21: 100 mg via INTRAVENOUS

## 2019-11-21 MED ORDER — MIDAZOLAM HCL 2 MG/2ML IJ SOLN
INTRAMUSCULAR | Status: DC | PRN
  Administered 2019-11-21: 2 mg via INTRAVENOUS

## 2019-11-21 MED ORDER — DEXAMETHASONE SODIUM PHOSPHATE 10 MG/ML IJ SOLN
INTRAMUSCULAR | Status: DC | PRN
  Administered 2019-11-21: 5 mg via INTRAVENOUS

## 2019-11-21 MED ORDER — SODIUM CHLORIDE 0.9 % IV SOLN
2.0000 g | INTRAVENOUS | Status: AC
  Administered 2019-11-21: 2 g via INTRAVENOUS
  Filled 2019-11-21: qty 20

## 2019-11-21 MED ORDER — MEPERIDINE HCL 50 MG/ML IJ SOLN: 6.2500 mg | INTRAMUSCULAR | Status: DC | PRN

## 2019-11-21 MED ORDER — LACTATED RINGERS IV SOLN
INTRAVENOUS | Status: DC | PRN
Start: 2019-11-21 — End: 2019-11-21

## 2019-11-21 MED ORDER — LACTATED RINGERS IV SOLN: Freq: Once | INTRAVENOUS | Status: AC

## 2019-11-21 MED ORDER — CHLORHEXIDINE GLUCONATE 0.12 % MT SOLN: 15.0000 mL | Freq: Once | OROMUCOSAL | Status: DC

## 2019-11-21 MED ORDER — SODIUM CHLORIDE 0.9 % IR SOLN
Status: DC | PRN
  Administered 2019-11-21 (×2): 3000 mL via INTRAVESICAL

## 2019-11-21 MED ORDER — HYDROMORPHONE HCL 1 MG/ML IJ SOLN: 0.2500 mg | INTRAMUSCULAR | Status: DC | PRN

## 2019-11-21 MED ORDER — SUGAMMADEX SODIUM 200 MG/2ML IV SOLN
INTRAVENOUS | Status: DC | PRN
  Administered 2019-11-21: 200 mg via INTRAVENOUS

## 2019-11-21 MED ORDER — FENTANYL CITRATE (PF) 100 MCG/2ML IJ SOLN
INTRAMUSCULAR | Status: DC | PRN
  Administered 2019-11-21: 50 ug via INTRAVENOUS

## 2019-11-21 MED ORDER — ONDANSETRON HCL 4 MG/2ML IJ SOLN: 4.0000 mg | Freq: Once | INTRAMUSCULAR | Status: DC | PRN

## 2019-11-21 MED ORDER — WATER FOR IRRIGATION, STERILE IR SOLN
Status: DC | PRN
  Administered 2019-11-21: 500 mL

## 2019-11-21 MED ORDER — FENTANYL CITRATE (PF) 100 MCG/2ML IJ SOLN
INTRAMUSCULAR | Status: AC
  Filled 2019-11-21: qty 2

## 2019-11-21 MED ORDER — PROPOFOL 10 MG/ML IV BOLUS
INTRAVENOUS | Status: DC | PRN
  Administered 2019-11-21: 50 mg via INTRAVENOUS
  Administered 2019-11-21: 150 mg via INTRAVENOUS

## 2019-11-21 MED ORDER — ORAL CARE MOUTH RINSE: 15.0000 mL | Freq: Once | OROMUCOSAL | Status: DC

## 2019-11-21 MED ORDER — MIDAZOLAM HCL 2 MG/2ML IJ SOLN
INTRAMUSCULAR | Status: AC
  Filled 2019-11-21: qty 2

## 2019-11-21 MED ORDER — LIDOCAINE 2% (20 MG/ML) 5 ML SYRINGE
INTRAMUSCULAR | Status: AC
  Filled 2019-11-21: qty 5

## 2019-11-21 MED ORDER — ROCURONIUM BROMIDE 10 MG/ML (PF) SYRINGE
PREFILLED_SYRINGE | INTRAVENOUS | Status: DC | PRN
  Administered 2019-11-21: 50 mg via INTRAVENOUS

## 2019-11-21 MED ORDER — PROPOFOL 10 MG/ML IV BOLUS
INTRAVENOUS | Status: AC
  Filled 2019-11-21: qty 20

## 2019-11-21 SURGICAL SUPPLY — 30 items
BAG DRAIN URO TABLE W/ADPT NS (BAG) ×4 IMPLANT
BAG DRN 8 ADPR NS SKTRN CSTL (BAG) ×2
BAG DRN RND TRDRP ANRFLXCHMBR (UROLOGICAL SUPPLIES) ×2
BAG HAMPER (MISCELLANEOUS) ×4 IMPLANT
BAG URINE DRAIN 2000ML AR STRL (UROLOGICAL SUPPLIES) ×4 IMPLANT
CATH FOLEY 2W 5CC 18FR LF (CATHETERS) ×4 IMPLANT
CATH INTERMIT  6FR 70CM (CATHETERS) ×4 IMPLANT
CLOTH BEACON ORANGE TIMEOUT ST (SAFETY) ×4 IMPLANT
DECANTER SPIKE VIAL GLASS SM (MISCELLANEOUS) ×4 IMPLANT
EXTRACTOR STONE NITINOL NGAGE (UROLOGICAL SUPPLIES) IMPLANT
FIBER LASER FLEXIVA 200 (UROLOGICAL SUPPLIES) IMPLANT
GLOVE BIO SURGEON STRL SZ8 (GLOVE) ×4 IMPLANT
GLOVE BIOGEL PI IND STRL 7.0 (GLOVE) ×6 IMPLANT
GLOVE BIOGEL PI INDICATOR 7.0 (GLOVE) ×6
GLOVE ECLIPSE 6.5 STRL STRAW (GLOVE) ×4 IMPLANT
GOWN STRL REUS W/TWL LRG LVL3 (GOWN DISPOSABLE) ×4 IMPLANT
GOWN STRL REUS W/TWL XL LVL3 (GOWN DISPOSABLE) ×4 IMPLANT
GUIDEWIRE STR DUAL SENSOR (WIRE) ×4 IMPLANT
GUIDEWIRE STR ZIPWIRE 035X150 (MISCELLANEOUS) ×4 IMPLANT
IV NS IRRIG 3000ML ARTHROMATIC (IV SOLUTION) ×8 IMPLANT
KIT TURNOVER CYSTO (KITS) ×4 IMPLANT
LASER FIBER DISP 1000U (UROLOGICAL SUPPLIES) IMPLANT
MANIFOLD NEPTUNE II (INSTRUMENTS) ×4 IMPLANT
PACK CYSTO (CUSTOM PROCEDURE TRAY) ×4 IMPLANT
PAD ARMBOARD 7.5X6 YLW CONV (MISCELLANEOUS) ×4 IMPLANT
STENT URET 6FRX26 CONTOUR (STENTS) IMPLANT
SYR 10ML LL (SYRINGE) ×4 IMPLANT
TOWEL OR 17X26 4PK STRL BLUE (TOWEL DISPOSABLE) ×4 IMPLANT
WATER STERILE IRR 1000ML POUR (IV SOLUTION) ×4 IMPLANT
WATER STERILE IRR 500ML POUR (IV SOLUTION) ×4 IMPLANT

## 2019-11-21 NOTE — Op Note (Signed)
Preoperative diagnosis: Right renal calculi, bladder calculus  Postoperative diagnosis: Same  Procedure: 1 cystoscopy 2.  right retrograde pyelography 3.  Intraoperative fluoroscopy, under one hour, with interpretation 4.  Right diagnostic ureteroscopy 5.  Cystolithalopaxy for a stone less than 2.5cm  Attending: Rosie Fate  Anesthesia: General  Estimated blood loss: None  Drains: none  Specimens: bladder calculus  Antibiotics: ancef  Findings: 0.9cm bladder calculus. No masses/lesions in the bladder. No right hydronephrosis. No calculi visualized in right kidney on nephroscopy.  Indications: Patient is a 54 year old male with a history of bladder calculus and right renal calculus.  After discussing treatment options, he decided proceed with cystolithalopaxy and right  Ureteroscopic stone extraction  Procedure her in detail: The patient was brought to the operating room and a brief timeout was done to ensure correct patient, correct procedure, correct site.  General anesthesia was administered patient was placed in dorsal lithotomy position.  His genitalia was then prepped and draped in usual sterile fashion.  A rigid 59 French cystoscope was passed in the urethra and the bladder.  Bladder was inspected free masses or lesions. The bladder calculus was then removed from the bladder.  the ureteral orifices were in the normal orthotopic locations.  a 6 french ureteral catheter was then instilled into the right ureter orifice.  a gentle retrograde was obtained and findings noted above.  we then placed a zip wire through the ureteral catheter and advanced up to the renal pelvis.  we then removed the cystoscope and cannulated the right ureteral orifice with a semirigid ureteroscope.  we then performed ureteroscopy up to the level of the UPJ. No stone or tumor was encountered. We then advanced a sensor wire into the renal pelvis. We then removed the scope and advanced a flexible ureteroscope  over the sensor wire and up to the renal pelvis. We performed nephroscopy and were unable to locate a calculus. We then removed the scope and the zipwire. We elected not to leave stent since this was an uncomplicated ureteroscopy. A 18 french foley was placed, the bladder was then drained and this concluded the procedure which was well tolerated by patient.  Complications: None  Condition: Stable, extubated, transferred to PACU  Plan: Pt is to followup in 2 weeks

## 2019-11-21 NOTE — Discharge Instructions (Signed)
Ureteroscopy Ureteroscopy is a procedure to check for and treat problems inside part of the urinary tract. In this procedure, a thin, tube-shaped instrument with a light at the end (ureteroscope) is used to look at the inside of the kidneys and the ureters, which are the tubes that carry urine from the kidneys to the bladder. The ureteroscope is inserted into one or both of the ureters. You may need this procedure if you have frequent urinary tract infections (UTIs), blood in your urine, or a stone in one of your ureters. A ureteroscopy can be done to find the cause of urine blockage in a ureter and to evaluate other abnormalities inside the ureters or kidneys. If stones are found, they can be removed during the procedure. Polyps, abnormal tissue, and some types of tumors can also be removed or treated. The ureteroscope may also have a tool to remove tissue to be checked for disease under a microscope (biopsy). Tell a health care provider about:  Any allergies you have.  All medicines you are taking, including vitamins, herbs, eye drops, creams, and over-the-counter medicines.  Any problems you or family members have had with anesthetic medicines.  Any blood disorders you have.  Any surgeries you have had.  Any medical conditions you have.  Whether you are pregnant or may be pregnant. What are the risks? Generally, this is a safe procedure. However, problems may occur, including:  Bleeding.  Infection.  Allergic reactions to medicines.  Scarring that narrows the ureter (stricture).  Creating a hole in the ureter (perforation). What happens before the procedure? Staying hydrated Follow instructions from your health care provider about hydration, which may include:  Up to 2 hours before the procedure - you may continue to drink clear liquids, such as water, clear fruit juice, black coffee, and plain tea. Eating and drinking restrictions Follow instructions from your health care  provider about eating and drinking, which may include:  8 hours before the procedure - stop eating heavy meals or foods such as meat, fried foods, or fatty foods.  6 hours before the procedure - stop eating light meals or foods, such as toast or cereal.  6 hours before the procedure - stop drinking milk or drinks that contain milk.  2 hours before the procedure - stop drinking clear liquids. Medicines  Ask your health care provider about: ? Changing or stopping your regular medicines. This is especially important if you are taking diabetes medicines or blood thinners. ? Taking medicines such as aspirin and ibuprofen. These medicines can thin your blood. Do not take these medicines before your procedure if your health care provider instructs you not to.  You may be given antibiotic medicine to help prevent infection. General instructions  You may have a urine sample taken to check for infection.  Plan to have someone take you home from the hospital or clinic. What happens during the procedure?   To reduce your risk of infection: ? Your health care team will wash or sanitize their hands. ? Your skin will be washed with soap.  An IV tube will be inserted into one of your veins.  You will be given one of the following: ? A medicine to help you relax (sedative). ? A medicine to make you fall asleep (general anesthetic). ? A medicine that is injected into your spine to numb the area below and slightly above the injection site (spinal anesthetic).  To lower your risk of infection, you may be given an antibiotic medicine  by an injection or through the IV tube.  The opening from which you urinate (urethra) will be cleaned with a germ-killing solution.  The ureteroscope will be passed through your urethra into your bladder.  A salt-water solution will flow through the ureteroscope to fill your bladder. This will help the health care provider see the openings of your ureters more  clearly.  Then, the ureteroscope will be passed into your ureter. ? If a growth is found, a piece of it may be removed so it can be examined under a microscope (biopsy). ? If a stone is found, it may be removed through the ureteroscope, or the stone may be broken up using a laser, shock waves, or electrical energy. ? In some cases, if the ureter is too small, a tube may be inserted that keeps the ureter open (ureteral stent). The stent may be left in place for 1 or 2 weeks to keep the ureter open, and then the ureteroscopy procedure will be performed.  The scope will be removed, and your bladder will be emptied. The procedure may vary among health care providers and hospitals. What happens after the procedure?  Your blood pressure, heart rate, breathing rate, and blood oxygen level will be monitored until the medicines you were given have worn off.  You may be asked to urinate.  Donot drive for 24 hours if you were given a sedative. This information is not intended to replace advice given to you by your health care provider. Make sure you discuss any questions you have with your health care provider. Document Revised: 03/06/2017 Document Reviewed: 01/04/2016 Elsevier Patient Education  2020 [place] After These instructions provide you with information about caring for yourself after your procedure. Your health care provider may also give you more specific instructions. Your treatment has been planned according to current medical practices, but problems sometimes occur. Call your health care provider if you have any problems or questions after your procedure. What can I expect after the procedure? After your procedure, you may:  Feel sleepy for several hours.  Feel clumsy and have poor balance for several hours.  Feel forgetful about what happened after the procedure.  Have poor judgment for several hours.  Feel nauseous or vomit.  Have  a sore throat if you had a breathing tube during the procedure. Follow these instructions at home: For at least 24 hours after the procedure:      Have a responsible adult stay with you. It is important to have someone help care for you until you are awake and alert.  Rest as needed.  Do not: ? Participate in activities in which you could fall or become injured. ? Drive. ? Use heavy machinery. ? Drink alcohol. ? Take sleeping pills or medicines that cause drowsiness. ? Make important decisions or sign legal documents. ? Take care of children on your own. Eating and drinking  Follow the diet that is recommended by your health care provider.  If you vomit, drink water, juice, or soup when you can drink without vomiting.  Make sure you have little or no nausea before eating solid foods. General instructions  Take over-the-counter and prescription medicines only as told by your health care provider.  If you have sleep apnea, surgery and certain medicines can increase your risk for breathing problems. Follow instructions from your health care provider about wearing your sleep device: ? Anytime you are sleeping, including during daytime naps. ?  While taking prescription pain medicines, sleeping medicines, or medicines that make you drowsy.  If you smoke, do not smoke without supervision.  Keep all follow-up visits as told by your health care provider. This is important. Contact a health care provider if:  You keep feeling nauseous or you keep vomiting.  You feel light-headed.  You develop a rash.  You have a fever. Get help right away if:  You have trouble breathing. Summary  For several hours after your procedure, you may feel sleepy and have poor judgment.  Have a responsible adult stay with you for at least 24 hours or until you are awake and alert. This information is not intended to replace advice given to you by your health care provider. Make sure you discuss  any questions you have with your health care provider. Document Revised: 06/22/2017 Document Reviewed: 07/15/2015 Elsevier Patient Education  [place].

## 2019-11-21 NOTE — Progress Notes (Signed)
Ready to go home. Ambulance staff here. Transferred pt to stretcher. D/C to home in good condition. Small black bag with contents, white sheet, pillow with white case, small gray "rag" and pillow between legs sent home with pt.

## 2019-11-21 NOTE — Anesthesia Preprocedure Evaluation (Signed)
Anesthesia Evaluation  Patient identified by MRN, date of birth, ID band Patient awake    Reviewed: Allergy & Precautions, NPO status , Patient's Chart, lab work & pertinent test results  History of Anesthesia Complications (+) DIFFICULT AIRWAY and history of anesthetic complications  Airway Mallampati: III  TM Distance: >3 FB Neck ROM: Limited   Comment: Difficult airway Dental  (+) Teeth Intact, Dental Advisory Given   Pulmonary  Tracheostomy, Tracheoesophageal fistula   Pulmonary exam normal breath sounds clear to auscultation       Cardiovascular Exercise Tolerance: Poor METS (QUADRIPLEGIC): Normal cardiovascular exam Rhythm:Regular Rate:Normal     Neuro/Psych Anxiety Encephalopathy   Neuromuscular disease (Quadriplegia )    GI/Hepatic GERD (nor reflux today)  Medicated and Controlled,Tracheoesophageal fistula   Endo/Other  negative endocrine ROS  Renal/GU CRF and Renal InsufficiencyRenal disease     Musculoskeletal  (+) Arthritis , cervical spine fx, cervical fusion    Abdominal   Peds  Hematology  (+) anemia ,   Anesthesia Other Findings   Reproductive/Obstetrics                           Anesthesia Physical Anesthesia Plan  ASA: IV  Anesthesia Plan: General   Post-op Pain Management:    Induction: Intravenous  PONV Risk Score and Plan: 4 or greater and Ondansetron, Dexamethasone and Midazolam  Airway Management Planned: Oral ETT and Video Laryngoscope Planned  Additional Equipment:   Intra-op Plan:   Post-operative Plan: Extubation in OR  Informed Consent: I have reviewed the patients History and Physical, chart, labs and discussed the procedure including the risks, benefits and alternatives for the proposed anesthesia with the patient or authorized representative who has indicated his/her understanding and acceptance.     Dental advisory given  Plan Discussed  with: CRNA and Surgeon  Anesthesia Plan Comments:      Anesthesia Quick Evaluation

## 2019-11-21 NOTE — Anesthesia Postprocedure Evaluation (Signed)
Anesthesia Post Note  Patient: [name]  Procedure(s) Performed: CYSTOSCOPY WITH  RIGHT RETROGRADE PYELOGRAM, DIAGNOSTIC SEMI RIGID AND FLEXIBLE, RIGHT URETEROSCOPY (Right )  Patient location during evaluation: PACU Anesthesia Type: General Level of consciousness: awake, awake and alert, oriented and patient cooperative Pain management: pain level controlled Vital Signs Assessment: post-procedure vital signs reviewed and stable Respiratory status: spontaneous breathing, nonlabored ventilation and respiratory function stable Cardiovascular status: blood pressure returned to baseline and stable Postop Assessment: no apparent nausea or vomiting Anesthetic complications: no   No complications documented.   Last Vitals:  Vitals:   11/21/19 1340 11/21/19 1345  BP: 118/90 111/84  Pulse: 81 88  Resp: 11 17  Temp:    SpO2: 100% 98%    Last Pain:  Vitals:   11/21/19 1118  TempSrc: Oral  PainSc: 0-No pain                 Deren Degrazia L

## 2019-11-21 NOTE — Interval H&P Note (Signed)
History and Physical Interval Note:  11/21/2019 12:02 PM  [name]  has presented today for surgery, with the diagnosis of rt renal calculus and bladder calculus.  The various methods of treatment have been discussed with the patient and family. After consideration of risks, benefits and other options for treatment, the patient has consented to  Procedure(s): CYSTOSCOPY WITH RETROGRADE PYELOGRAM, URETEROSCOPY AND STENT PLACEMENT (Right) CYSTOSCOPY WITH LASER LITHOLAPAXY (N/A) as a surgical intervention.  The patient's history has been reviewed, patient examined, no change in status, stable for surgery.  I have reviewed the patient's chart and labs.  Questions were answered to the patient's satisfaction.     Nicolette Bang

## 2019-11-21 NOTE — Anesthesia Procedure Notes (Signed)
Procedure Name: Intubation Date/Time: 11/21/2019 12:40 PM Performed by: Raenette Rover, CRNA Pre-anesthesia Checklist: Patient identified, Emergency Drugs available, Suction available and Patient being monitored Patient Re-evaluated:Patient Re-evaluated prior to induction Oxygen Delivery Method: Circle system utilized Preoxygenation: Pre-oxygenation with 100% oxygen Induction Type: IV induction Ventilation: Mask ventilation without difficulty Laryngoscope Size: Glidescope and 4 Grade View: Grade I Tube type: Oral Tube size: 7.0 mm Number of attempts: 1 Airway Equipment and Method: Bougie stylet and Video-laryngoscopy Placement Confirmation: ETT inserted through vocal cords under direct vision,  positive ETCO2 and breath sounds checked- equal and bilateral Secured at: 24 cm Tube secured with: Tape Dental Injury: Teeth and Oropharynx as per pre-operative assessment

## 2019-11-21 NOTE — Transfer of Care (Signed)
Immediate Anesthesia Transfer of Care Note  Patient: EUGINE BUBB  Procedure(s) Performed: CYSTOSCOPY WITH  RIGHT RETROGRADE PYELOGRAM, DIAGNOSTIC SEMI RIGID AND FLEXIBLE, RIGHT URETEROSCOPY (Right )  Patient Location: PACU  Anesthesia Type:General  Level of Consciousness: awake, alert , oriented and patient cooperative  Airway & Oxygen Therapy: Patient Spontanous Breathing and Patient connected to face mask oxygen  Post-op Assessment: Report given to RN and Post -op Vital signs reviewed and stable  Post vital signs: Reviewed and stable  Last Vitals:  Vitals Value Taken Time  BP 160/103 11/21/19 1337  Temp    Pulse 78 11/21/19 1338  Resp 23 11/21/19 1338  SpO2 100 % 11/21/19 1338  Vitals shown include unvalidated device data.  Last Pain:  Vitals:   11/21/19 1118  TempSrc: Oral  PainSc: 0-No pain      Patients Stated Pain Goal: 7 (19/62/22 [id number])  Complications: No complications documented.

## 2019-11-22 ENCOUNTER — Encounter (HOSPITAL_COMMUNITY): Payer: Self-pay | Admitting: Urology

## 2019-11-27 LAB — CALCULI, WITH PHOTOGRAPH (CLINICAL LAB)
Calcium Oxalate Monohydrate: 30 %
Carbonate Apatite: 30 %
Mg NH4 PO4 (Struvite): 40 %
Weight Calculi: 152 mg

## 2019-11-29 ENCOUNTER — Encounter: Payer: Self-pay | Admitting: Urology

## 2019-11-29 ENCOUNTER — Other Ambulatory Visit: Payer: Self-pay

## 2019-11-29 ENCOUNTER — Ambulatory Visit (INDEPENDENT_AMBULATORY_CARE_PROVIDER_SITE_OTHER): Payer: Medicare Other | Admitting: Urology

## 2019-11-29 VITALS — BP 143/96 | HR 83 | Temp 97.9°F | Ht 72.0 in | Wt 140.0 lb

## 2019-11-29 DIAGNOSIS — N200 Calculus of kidney: Secondary | ICD-10-CM | POA: Diagnosis not present

## 2019-11-29 DIAGNOSIS — N210 Calculus in bladder: Secondary | ICD-10-CM | POA: Diagnosis not present

## 2019-11-29 NOTE — Progress Notes (Signed)

## 2019-11-29 NOTE — Patient Instructions (Signed)
Dietary Guidelines to Help Prevent Kidney Stones Kidney stones are deposits of minerals and salts that form inside your kidneys. Your risk of developing kidney stones may be greater depending on your diet, your lifestyle, the medicines you take, and whether you have certain medical conditions. Most people can reduce their chances of developing kidney stones by following the instructions below. Depending on your overall health and the type of kidney stones you tend to develop, your dietitian may give you more specific instructions. What are tips for following this plan? Reading food labels  Choose foods with "no salt added" or "low-salt" labels. Limit your sodium intake to less than 1500 mg per day.  Choose foods with calcium for each meal and snack. Try to eat about 300 mg of calcium at each meal. Foods that contain 200-500 mg of calcium per serving include: ? 8 oz (237 ml) of milk, fortified nondairy milk, and fortified fruit juice. ? 8 oz (237 ml) of kefir, yogurt, and soy yogurt. ? 4 oz (118 ml) of tofu. ? 1 oz of cheese. ? 1 cup (300 g) of dried figs. ? 1 cup (91 g) of cooked broccoli. ? 1-3 oz can of sardines or mackerel.  Most people need 1000 to 1500 mg of calcium each day. Talk to your dietitian about how much calcium is recommended for you. Shopping  Buy plenty of fresh fruits and vegetables. Most people do not need to avoid fruits and vegetables, even if they contain nutrients that may contribute to kidney stones.  When shopping for convenience foods, choose: ? Whole pieces of fruit. ? Premade salads with dressing on the side. ? Low-fat fruit and yogurt smoothies.  Avoid buying frozen meals or prepared deli foods.  Look for foods with live cultures, such as yogurt and kefir. Cooking  Do not add salt to food when cooking. Place a salt shaker on the table and allow each person to add his or her own salt to taste.  Use vegetable protein, such as beans, textured vegetable  protein (TVP), or tofu instead of meat in pasta, casseroles, and soups. Meal planning   Eat less salt, if told by your dietitian. To do this: ? Avoid eating processed or premade food. ? Avoid eating fast food.  Eat less animal protein, including cheese, meat, poultry, or fish, if told by your dietitian. To do this: ? Limit the number of times you have meat, poultry, fish, or cheese each week. Eat a diet free of meat at least 2 days a week. ? Eat only one serving each day of meat, poultry, fish, or seafood. ? When you prepare animal protein, cut pieces into small portion sizes. For most meat and fish, one serving is about the size of one deck of cards.  Eat at least 5 servings of fresh fruits and vegetables each day. To do this: ? Keep fruits and vegetables on hand for snacks. ? Eat 1 piece of fruit or a handful of berries with breakfast. ? Have a salad and fruit at lunch. ? Have two kinds of vegetables at dinner.  Limit foods that are high in a substance called oxalate. These include: ? Spinach. ? Rhubarb. ? Beets. ? Potato chips and french fries. ? Nuts.  If you regularly take a diuretic medicine, make sure to eat at least 1-2 fruits or vegetables high in potassium each day. These include: ? Avocado. ? Banana. ? Orange, prune, carrot, or tomato juice. ? Baked potato. ? Cabbage. ? Beans and split   peas. General instructions   Drink enough fluid to keep your urine clear or pale yellow. This is the most important thing you can do.  Talk to your health care provider and dietitian about taking daily supplements. Depending on your health and the cause of your kidney stones, you may be advised: ? Not to take supplements with vitamin C. ? To take a calcium supplement. ? To take a daily probiotic supplement. ? To take other supplements such as magnesium, fish oil, or vitamin B6.  Take all medicines and supplements as told by your health care provider.  Limit alcohol intake to no  more than 1 drink a day for nonpregnant women and 2 drinks a day for men. One drink equals 12 oz of beer, 5 oz of wine, or 1 oz of hard liquor.  Lose weight if told by your health care provider. Work with your dietitian to find strategies and an eating plan that works best for you. What foods are not recommended? Limit your intake of the following foods, or as told by your dietitian. Talk to your dietitian about specific foods you should avoid based on the type of kidney stones and your overall health. Grains Breads. Bagels. Rolls. Baked goods. Salted crackers. Cereal. Pasta. Vegetables Spinach. Rhubarb. Beets. Canned vegetables. Pickles. Olives. Meats and other protein foods Nuts. Nut butters. Large portions of meat, poultry, or fish. Salted or cured meats. Deli meats. Hot dogs. Sausages. Dairy Cheese. Beverages Regular soft drinks. Regular vegetable juice. Seasonings and other foods Seasoning blends with salt. Salad dressings. Canned soups. Soy sauce. Ketchup. Barbecue sauce. Canned pasta sauce. Casseroles. Pizza. Lasagna. Frozen meals. Potato chips. French fries. Summary  You can reduce your risk of kidney stones by making changes to your diet.  The most important thing you can do is drink enough fluid. You should drink enough fluid to keep your urine clear or pale yellow.  Ask your health care provider or dietitian how much protein from animal sources you should eat each day, and also how much salt and calcium you should have each day. This information is not intended to replace advice given to you by your health care provider. Make sure you discuss any questions you have with your health care provider. Document Revised: 07/14/2018 Document Reviewed: 03/04/2016 Elsevier Patient Education  2020 [employer]  
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
## 2019-12-06 ENCOUNTER — Other Ambulatory Visit: Payer: Self-pay

## 2019-12-06 ENCOUNTER — Ambulatory Visit (HOSPITAL_COMMUNITY)
Admission: RE | Admit: 2019-12-06 | Discharge: 2019-12-06 | Disposition: A | Discharge status: Home / Self Care | Payer: Medicare Other | Source: Ambulatory Visit | Attending: Urology | Admitting: Urology

## 2019-12-06 DIAGNOSIS — N200 Calculus of kidney: Secondary | ICD-10-CM | POA: Diagnosis present

## 2019-12-13 NOTE — Progress Notes (Signed)
Results mailed 

## 2019-12-30 ENCOUNTER — Other Ambulatory Visit (HOSPITAL_COMMUNITY)
Admission: RE | Admit: 2019-12-30 | Discharge: 2019-12-30 | Disposition: A | Discharge status: Home / Self Care | Payer: Medicare Other | Source: Other Acute Inpatient Hospital | Attending: Adult Health | Admitting: Adult Health

## 2019-12-30 DIAGNOSIS — R531 Weakness: Secondary | ICD-10-CM | POA: Diagnosis not present

## 2019-12-30 DIAGNOSIS — Z01812 Encounter for preprocedural laboratory examination: Secondary | ICD-10-CM | POA: Insufficient documentation

## 2019-12-30 LAB — CBC WITH DIFFERENTIAL/PLATELET
Abs Immature Granulocytes: 0.01 10*3/uL (ref 0.00–0.07)
Basophils Absolute: 0.1 10*3/uL (ref 0.0–0.1)
Basophils Relative: 1 %
Eosinophils Absolute: 0.5 10*3/uL (ref 0.0–0.5)
Eosinophils Relative: 7 %
HCT: 38.3 % — ABNORMAL LOW (ref 39.0–52.0)
Hemoglobin: 12.2 g/dL — ABNORMAL LOW (ref 13.0–17.0)
Immature Granulocytes: 0 %
Lymphocytes Relative: 18 %
Lymphs Abs: 1.4 10*3/uL (ref 0.7–4.0)
MCH: 25.2 pg — ABNORMAL LOW (ref 26.0–34.0)
MCHC: 31.9 g/dL (ref 30.0–36.0)
MCV: 79.1 fL — ABNORMAL LOW (ref 80.0–100.0)
Monocytes Absolute: 0.6 10*3/uL (ref 0.1–1.0)
Monocytes Relative: 7 %
Neutro Abs: 5.3 10*3/uL (ref 1.7–7.7)
Neutrophils Relative %: 67 %
Platelets: 422 10*3/uL — ABNORMAL HIGH (ref 150–400)
RBC: 4.84 MIL/uL (ref 4.22–5.81)
RDW: 16.2 % — ABNORMAL HIGH (ref 11.5–15.5)
WBC: 7.8 10*3/uL (ref 4.0–10.5)
nRBC: 0 % (ref 0.0–0.2)

## 2019-12-30 LAB — COMPREHENSIVE METABOLIC PANEL
ALT: 21 U/L (ref 0–44)
AST: 16 U/L (ref 15–41)
Albumin: 3.5 g/dL (ref 3.5–5.0)
Alkaline Phosphatase: 109 U/L (ref 38–126)
Anion gap: 10 (ref 5–15)
BUN: 14 mg/dL (ref 6–20)
CO2: 24 mmol/L (ref 22–32)
Calcium: 9.7 mg/dL (ref 8.9–10.3)
Chloride: 101 mmol/L (ref 98–111)
Creatinine, Ser: 0.52 mg/dL — ABNORMAL LOW (ref 0.61–1.24)
GFR calc Af Amer: >60 mL/min (ref >60)
GFR calc non Af Amer: >60 mL/min (ref >60)
Glucose, Bld: 96 mg/dL (ref 70–99)
Potassium: 3.9 mmol/L (ref 3.5–5.1)
Sodium: 135 mmol/L (ref 135–145)
Total Bilirubin: 0.4 mg/dL (ref 0.3–1.2)
Total Protein: 8.1 g/dL (ref 6.5–8.1)

## 2020-01-02 ENCOUNTER — Ambulatory Visit (INDEPENDENT_AMBULATORY_CARE_PROVIDER_SITE_OTHER): Payer: Medicare Other | Admitting: Urology

## 2020-01-02 ENCOUNTER — Other Ambulatory Visit: Payer: Self-pay

## 2020-01-02 ENCOUNTER — Encounter: Payer: Self-pay | Admitting: Urology

## 2020-01-02 VITALS — BP 99/69 | HR 85 | Temp 98.0°F | Ht 72.0 in | Wt 140.0 lb

## 2020-01-02 DIAGNOSIS — N200 Calculus of kidney: Secondary | ICD-10-CM
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
## 2020-01-02 NOTE — Patient Instructions (Signed)

## 2020-01-02 NOTE — Progress Notes (Signed)

## 2020-01-17 ENCOUNTER — Telehealth: Payer: Self-pay

## 2020-01-17 NOTE — Telephone Encounter (Signed)
Pts [facility]N called and said she just changed pts cath and there was a discharge coming from head of penis. Michela Pitcher it looked cloudy and there was a lot of sediment from cath also. Asked for order to run urine UA and culture. I told her to go ahead per normal verbal from Dr. Alyson Ingles.

## 2020-01-18 ENCOUNTER — Other Ambulatory Visit: Payer: Self-pay

## 2020-06-08 ENCOUNTER — Emergency Department (HOSPITAL_COMMUNITY): Payer: Medicare Other

## 2020-06-08 ENCOUNTER — Other Ambulatory Visit: Payer: Self-pay

## 2020-06-08 ENCOUNTER — Emergency Department (HOSPITAL_COMMUNITY)
Admission: EM | Admit: 2020-06-08 | Discharge: 2020-06-08 | Disposition: A | Discharge status: Home / Self Care | Payer: Medicare Other | Attending: Emergency Medicine | Admitting: Emergency Medicine

## 2020-06-08 ENCOUNTER — Encounter (HOSPITAL_COMMUNITY): Payer: Self-pay | Admitting: *Deleted

## 2020-06-08 DIAGNOSIS — L97511 Non-pressure chronic ulcer of other part of right foot limited to breakdown of skin: Secondary | ICD-10-CM | POA: Diagnosis not present

## 2020-06-08 DIAGNOSIS — Z20822 Contact with and (suspected) exposure to covid-19: Secondary | ICD-10-CM | POA: Insufficient documentation

## 2020-06-08 DIAGNOSIS — I951 Orthostatic hypotension: Secondary | ICD-10-CM | POA: Insufficient documentation

## 2020-06-08 DIAGNOSIS — K219 Gastro-esophageal reflux disease without esophagitis: Secondary | ICD-10-CM | POA: Insufficient documentation

## 2020-06-08 DIAGNOSIS — R531 Weakness: Secondary | ICD-10-CM

## 2020-06-08 DIAGNOSIS — L03116 Cellulitis of left lower limb: Secondary | ICD-10-CM | POA: Diagnosis not present

## 2020-06-08 DIAGNOSIS — F419 Anxiety disorder, unspecified: Secondary | ICD-10-CM | POA: Insufficient documentation

## 2020-06-08 DIAGNOSIS — M8660 Other chronic osteomyelitis, unspecified site: Secondary | ICD-10-CM | POA: Diagnosis not present

## 2020-06-08 DIAGNOSIS — G8250 Quadriplegia, unspecified: Secondary | ICD-10-CM | POA: Diagnosis not present

## 2020-06-08 DIAGNOSIS — D72829 Elevated white blood cell count, unspecified: Secondary | ICD-10-CM | POA: Insufficient documentation

## 2020-06-08 DIAGNOSIS — M8610 Other acute osteomyelitis, unspecified site: Secondary | ICD-10-CM

## 2020-06-08 DIAGNOSIS — M86672 Other chronic osteomyelitis, left ankle and foot: Secondary | ICD-10-CM | POA: Insufficient documentation

## 2020-06-08 DIAGNOSIS — Z79899 Other long term (current) drug therapy: Secondary | ICD-10-CM | POA: Diagnosis not present

## 2020-06-08 DIAGNOSIS — L97521 Non-pressure chronic ulcer of other part of left foot limited to breakdown of skin: Secondary | ICD-10-CM | POA: Diagnosis not present

## 2020-06-08 DIAGNOSIS — R059 Cough, unspecified: Secondary | ICD-10-CM | POA: Insufficient documentation

## 2020-06-08 DIAGNOSIS — R509 Fever, unspecified: Secondary | ICD-10-CM | POA: Insufficient documentation

## 2020-06-08 DIAGNOSIS — L97522 Non-pressure chronic ulcer of other part of left foot with fat layer exposed: Secondary | ICD-10-CM

## 2020-06-08 DIAGNOSIS — M62838 Other muscle spasm: Secondary | ICD-10-CM | POA: Diagnosis not present

## 2020-06-08 LAB — URINALYSIS, ROUTINE W REFLEX MICROSCOPIC
Bilirubin Urine: NEGATIVE
Glucose, UA: NEGATIVE mg/dL
Ketones, ur: NEGATIVE mg/dL
Nitrite: NEGATIVE
Protein, ur: NEGATIVE mg/dL
Specific Gravity, Urine: 1.001 — ABNORMAL LOW (ref 1.005–1.030)
pH: 7 (ref 5.0–8.0)

## 2020-06-08 LAB — CBC WITH DIFFERENTIAL/PLATELET
Abs Immature Granulocytes: 0.07 10*3/uL (ref 0.00–0.07)
Basophils Absolute: 0.1 10*3/uL (ref 0.0–0.1)
Basophils Relative: 1 %
Eosinophils Absolute: 0.3 10*3/uL (ref 0.0–0.5)
Eosinophils Relative: 2 %
HCT: 33.7 % — ABNORMAL LOW (ref 39.0–52.0)
Hemoglobin: 11.3 g/dL — ABNORMAL LOW (ref 13.0–17.0)
Immature Granulocytes: 1 %
Lymphocytes Relative: 8 %
Lymphs Abs: 1 10*3/uL (ref 0.7–4.0)
MCH: 26.7 pg (ref 26.0–34.0)
MCHC: 33.5 g/dL (ref 30.0–36.0)
MCV: 79.5 fL — ABNORMAL LOW (ref 80.0–100.0)
Monocytes Absolute: 1.1 10*3/uL — ABNORMAL HIGH (ref 0.1–1.0)
Monocytes Relative: 9 %
Neutro Abs: 10 10*3/uL — ABNORMAL HIGH (ref 1.7–7.7)
Neutrophils Relative %: 79 %
Platelets: 264 10*3/uL (ref 150–400)
RBC: 4.24 MIL/uL (ref 4.22–5.81)
RDW: 15.6 % — ABNORMAL HIGH (ref 11.5–15.5)
WBC: 12.4 10*3/uL — ABNORMAL HIGH (ref 4.0–10.5)
nRBC: 0 % (ref 0.0–0.2)

## 2020-06-08 LAB — RESP PANEL BY RT-PCR (FLU A&B, COVID) ARPGX2
Influenza A by PCR: NEGATIVE
Influenza B by PCR: NEGATIVE
SARS Coronavirus 2 by RT PCR: NEGATIVE

## 2020-06-08 LAB — COMPREHENSIVE METABOLIC PANEL
ALT: 54 U/L — ABNORMAL HIGH (ref 0–44)
AST: 32 U/L (ref 15–41)
Albumin: 3.5 g/dL (ref 3.5–5.0)
Alkaline Phosphatase: 111 U/L (ref 38–126)
Anion gap: 8 (ref 5–15)
BUN: 12 mg/dL (ref 6–20)
CO2: 22 mmol/L (ref 22–32)
Calcium: 9.1 mg/dL (ref 8.9–10.3)
Chloride: 101 mmol/L (ref 98–111)
Creatinine, Ser: 0.54 mg/dL — ABNORMAL LOW (ref 0.61–1.24)
GFR, Estimated: >60 mL/min (ref >60)
Glucose, Bld: 114 mg/dL — ABNORMAL HIGH (ref 70–99)
Potassium: 3.7 mmol/L (ref 3.5–5.1)
Sodium: 131 mmol/L — ABNORMAL LOW (ref 135–145)
Total Bilirubin: 0.7 mg/dL (ref 0.3–1.2)
Total Protein: 7.5 g/dL (ref 6.5–8.1)

## 2020-06-08 LAB — LACTIC ACID, PLASMA: Lactic Acid, Venous: 0.7 mmol/L (ref 0.5–1.9)

## 2020-06-08 LAB — SEDIMENTATION RATE: Sed Rate: 45 mm/hr — ABNORMAL HIGH (ref 0–16)

## 2020-06-08 MED ORDER — CEPHALEXIN 500 MG PO CAPS: ORAL_CAPSULE | ORAL | 0 refills | Status: DC

## 2020-06-08 MED ORDER — PIPERACILLIN-TAZOBACTAM 3.375 G IVPB 30 MIN
3.3750 g | Freq: Once | INTRAVENOUS | Status: AC
  Administered 2020-06-08: 3.375 g via INTRAVENOUS
  Filled 2020-06-08: qty 50

## 2020-06-08 MED ORDER — GADOBUTROL 1 MMOL/ML IV SOLN
6.0000 mL | Freq: Once | INTRAVENOUS | Status: AC | PRN
  Administered 2020-06-08: 6 mL via INTRAVENOUS

## 2020-06-08 MED ORDER — SODIUM CHLORIDE 0.9 % IV BOLUS
1000.0000 mL | Freq: Once | INTRAVENOUS | Status: AC
  Administered 2020-06-08: 1000 mL via INTRAVENOUS

## 2020-06-08 MED ORDER — DOXYCYCLINE HYCLATE 100 MG PO CAPS: 100.0000 mg | ORAL_CAPSULE | Freq: Two times a day (BID) | ORAL | 0 refills | Status: AC

## 2020-06-08 MED ORDER — VANCOMYCIN HCL 1250 MG/250ML IV SOLN
1250.0000 mg | Freq: Once | INTRAVENOUS | Status: AC
  Administered 2020-06-08: 1250 mg via INTRAVENOUS
  Filled 2020-06-08: qty 250

## 2020-06-08 MED ORDER — VANCOMYCIN HCL IN DEXTROSE 1-5 GM/200ML-% IV SOLN: 1000.0000 mg | Freq: Two times a day (BID) | INTRAVENOUS | Status: DC

## 2020-06-08 MED ORDER — VANCOMYCIN HCL IN DEXTROSE 1-5 GM/200ML-% IV SOLN
1000.0000 mg | Freq: Once | INTRAVENOUS | Status: DC
  Filled 2020-06-08: qty 200

## 2020-06-08 NOTE — Discharge Instructions (Addendum)
Follow up with your wound center next week

## 2020-06-08 NOTE — ED Triage Notes (Signed)
Pt brought in by [facility] with c/o fever and productive cough since yesterday morning. Pt's last temperature was 102 prior to leaving his house and pt was given Tylenol 1gm. HR-98, BP-130/83 for EMS. Pt has indwelling foley.

## 2020-06-08 NOTE — ED Notes (Signed)
Received report from [facility]. Pt resting. Nad. No needs/complaints at this time.
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
## 2020-06-08 NOTE — ED Notes (Signed)
This RN assumed care for this pt, pt resting comfortably in stretcher @ this time, no needs expressed, call light w/in reach.
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
## 2020-06-08 NOTE — Progress Notes (Signed)
Pharmacy Antibiotic Note  [name] is a 55 y.o. male admitted on 06/08/2020 with cellulitis.  Pharmacy has been consulted for Vancomycin dosing.  Plan: Vancomycin 1250mg  IV loading dose, then 1000mg   IV every 12 hours.  Goal trough 10-15 mcg/mL.  F/U cxs and clinical progress Monitor V/S labs and levels as indicated  Height: 6\' 1"  (185.4 cm) Weight: 63.5 kg (139 lb 15.9 oz) IBW/kg (Calculated) : 79.9  Temp (24hrs), Avg:98.7 F (37.1 C), Min:98.4 F (36.9 C), Max:98.9 F (37.2 C)  Recent Labs  Lab 06/08/20 1030  WBC 12.4*  CREATININE 0.54*  LATICACIDVEN 0.7    Estimated Creatinine Clearance: 93.7 mL/min (A) (by C-G formula based on SCr of 0.54 mg/dL (L)).    Allergies  Allergen Reactions  . Niacin And Related Hives  . Other Hives    From work uniform  . Shellfish Allergy Swelling    Lip swelling    Antimicrobials this admission: Vancomycin 3/4>>  Zosyn 3.375g IV x1 in ED  Microbiology results: 3/4 BCx: pending  Thank you for allowing pharmacy to be a part of this patient's care.  Isac Sarna, BS [place], [place] Clinical Pharmacist Pager [phone] 06/08/2020 3:08 PM

## 2020-06-13 LAB — CULTURE, BLOOD (ROUTINE X 2)
Culture: NO GROWTH
Culture: NO GROWTH
Special Requests: ADEQUATE
Special Requests: ADEQUATE

## 2020-06-27 ENCOUNTER — Other Ambulatory Visit (HOSPITAL_COMMUNITY): Payer: Medicare Other

## 2020-06-28 ENCOUNTER — Ambulatory Visit (HOSPITAL_COMMUNITY)
Admission: RE | Admit: 2020-06-28 | Discharge: 2020-06-28 | Disposition: A | Discharge status: Home / Self Care | Payer: Medicare Other | Source: Ambulatory Visit | Attending: Urology | Admitting: Urology

## 2020-06-28 DIAGNOSIS — N200 Calculus of kidney: Secondary | ICD-10-CM | POA: Diagnosis present

## 2020-07-04 ENCOUNTER — Ambulatory Visit: Payer: Medicare Other | Admitting: Urology

## 2020-08-15 ENCOUNTER — Ambulatory Visit (INDEPENDENT_AMBULATORY_CARE_PROVIDER_SITE_OTHER): Payer: Medicare Other | Admitting: Urology

## 2020-08-15 ENCOUNTER — Encounter: Payer: Self-pay | Admitting: Urology

## 2020-08-15 ENCOUNTER — Other Ambulatory Visit: Payer: Self-pay

## 2020-08-15 DIAGNOSIS — N200 Calculus of kidney: Secondary | ICD-10-CM | POA: Diagnosis not present
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
## 2020-08-15 NOTE — Patient Instructions (Signed)

## 2020-08-15 NOTE — Progress Notes (Signed)
Cath Change/ Replacement  Patient is present today for a catheter change due to urinary retention.  75ml of water was removed from the balloon, a 18FR foley cath was removed with out difficulty.  Patient was cleaned and prepped in a sterile fashion with betadine. A 18 FR foley cath was replaced into the bladder no complications were noted Urine return was noted 63ml and urine was yellow in color. The balloon was filled with 22ml of sterile water. A bedside bag was attached for drainage.  A night bag was also given to the patient and patient was given instruction on how to change from one bag to another. Patient was given proper instruction on catheter care.    Performed by: Estill Bamberg RN  Follow up:1 month NV cath change

## 2020-09-10 DIAGNOSIS — Z9889 Other specified postprocedural states: Secondary | ICD-10-CM | POA: Insufficient documentation

## 2020-09-20 ENCOUNTER — Ambulatory Visit: Payer: Medicare Other

## 2020-09-24 ENCOUNTER — Other Ambulatory Visit (HOSPITAL_COMMUNITY)
Admission: RE | Admit: 2020-09-24 | Discharge: 2020-09-24 | Disposition: A | Discharge status: Home / Self Care | Payer: Medicare Other | Source: Ambulatory Visit | Attending: Vascular Surgery | Admitting: Vascular Surgery

## 2020-09-24 DIAGNOSIS — M869 Osteomyelitis, unspecified: Secondary | ICD-10-CM | POA: Diagnosis present

## 2020-09-24 DIAGNOSIS — M86672 Other chronic osteomyelitis, left ankle and foot: Secondary | ICD-10-CM | POA: Insufficient documentation

## 2020-09-24 DIAGNOSIS — Z5181 Encounter for therapeutic drug level monitoring: Secondary | ICD-10-CM | POA: Diagnosis present

## 2020-09-24 LAB — CBC WITH DIFFERENTIAL/PLATELET
Abs Immature Granulocytes: 0.03 10*3/uL (ref 0.00–0.07)
Basophils Absolute: 0.1 10*3/uL (ref 0.0–0.1)
Basophils Relative: 1 %
Eosinophils Absolute: 0.7 10*3/uL — ABNORMAL HIGH (ref 0.0–0.5)
Eosinophils Relative: 9 %
HCT: 37.6 % — ABNORMAL LOW (ref 39.0–52.0)
Hemoglobin: 12.3 g/dL — ABNORMAL LOW (ref 13.0–17.0)
Immature Granulocytes: 0 %
Lymphocytes Relative: 22 %
Lymphs Abs: 1.5 10*3/uL (ref 0.7–4.0)
MCH: 26.2 pg (ref 26.0–34.0)
MCHC: 32.7 g/dL (ref 30.0–36.0)
MCV: 80 fL (ref 80.0–100.0)
Monocytes Absolute: 0.7 10*3/uL (ref 0.1–1.0)
Monocytes Relative: 11 %
Neutro Abs: 4.1 10*3/uL (ref 1.7–7.7)
Neutrophils Relative %: 57 %
Platelets: 353 10*3/uL (ref 150–400)
RBC: 4.7 MIL/uL (ref 4.22–5.81)
RDW: 15.1 % (ref 11.5–15.5)
WBC: 7.1 10*3/uL (ref 4.0–10.5)
nRBC: 0 % (ref 0.0–0.2)

## 2020-09-24 LAB — BASIC METABOLIC PANEL
Anion gap: 6 (ref 5–15)
BUN: 18 mg/dL (ref 6–20)
CO2: 25 mmol/L (ref 22–32)
Calcium: 9.5 mg/dL (ref 8.9–10.3)
Chloride: 103 mmol/L (ref 98–111)
Creatinine, Ser: 0.5 mg/dL — ABNORMAL LOW (ref 0.61–1.24)
GFR, Estimated: >60 mL/min (ref >60)
Glucose, Bld: 84 mg/dL (ref 70–99)
Potassium: 4.7 mmol/L (ref 3.5–5.1)
Sodium: 134 mmol/L — ABNORMAL LOW (ref 135–145)

## 2020-09-24 LAB — VANCOMYCIN, TROUGH: Vancomycin Tr: 14 ug/mL — ABNORMAL LOW (ref 15–20)

## 2020-10-01 ENCOUNTER — Other Ambulatory Visit (HOSPITAL_COMMUNITY)
Admission: RE | Admit: 2020-10-01 | Discharge: 2020-10-01 | Disposition: A | Discharge status: Home / Self Care | Payer: Medicare Other | Source: Other Acute Inpatient Hospital | Attending: Vascular Surgery | Admitting: Vascular Surgery

## 2020-10-01 DIAGNOSIS — M8610 Other acute osteomyelitis, unspecified site: Secondary | ICD-10-CM | POA: Insufficient documentation

## 2020-10-01 DIAGNOSIS — Z5181 Encounter for therapeutic drug level monitoring: Secondary | ICD-10-CM | POA: Insufficient documentation

## 2020-10-01 LAB — CBC
HCT: 39.4 % (ref 39.0–52.0)
Hemoglobin: 12.9 g/dL — ABNORMAL LOW (ref 13.0–17.0)
MCH: 26.3 pg (ref 26.0–34.0)
MCHC: 32.7 g/dL (ref 30.0–36.0)
MCV: 80.2 fL (ref 80.0–100.0)
Platelets: 379 10*3/uL (ref 150–400)
RBC: 4.91 MIL/uL (ref 4.22–5.81)
RDW: 15.4 % (ref 11.5–15.5)
WBC: 7.9 10*3/uL (ref 4.0–10.5)
nRBC: 0 % (ref 0.0–0.2)

## 2020-10-01 LAB — COMPREHENSIVE METABOLIC PANEL
ALT: 25 U/L (ref 0–44)
AST: 20 U/L (ref 15–41)
Albumin: 3.5 g/dL (ref 3.5–5.0)
Alkaline Phosphatase: 87 U/L (ref 38–126)
Anion gap: 5 (ref 5–15)
BUN: 22 mg/dL — ABNORMAL HIGH (ref 6–20)
CO2: 26 mmol/L (ref 22–32)
Calcium: 9.5 mg/dL (ref 8.9–10.3)
Chloride: 104 mmol/L (ref 98–111)
Creatinine, Ser: 0.56 mg/dL — ABNORMAL LOW (ref 0.61–1.24)
GFR, Estimated: >60 mL/min (ref >60)
Glucose, Bld: 94 mg/dL (ref 70–99)
Potassium: 4.9 mmol/L (ref 3.5–5.1)
Sodium: 135 mmol/L (ref 135–145)
Total Bilirubin: 0.4 mg/dL (ref 0.3–1.2)
Total Protein: 7.5 g/dL (ref 6.5–8.1)

## 2020-10-01 LAB — VANCOMYCIN, TROUGH: Vancomycin Tr: 13 ug/mL — ABNORMAL LOW (ref 15–20)

## 2020-10-18 ENCOUNTER — Other Ambulatory Visit (HOSPITAL_COMMUNITY)
Admission: RE | Admit: 2020-10-18 | Discharge: 2020-10-18 | Disposition: A | Discharge status: Home / Self Care | Payer: Medicare Other | Source: Other Acute Inpatient Hospital | Attending: Vascular Surgery | Admitting: Vascular Surgery

## 2020-10-18 DIAGNOSIS — Z5181 Encounter for therapeutic drug level monitoring: Secondary | ICD-10-CM | POA: Diagnosis present

## 2020-10-18 DIAGNOSIS — M869 Osteomyelitis, unspecified: Secondary | ICD-10-CM | POA: Diagnosis not present

## 2020-10-18 LAB — CBC WITH DIFFERENTIAL/PLATELET
Abs Immature Granulocytes: 0.02 10*3/uL (ref 0.00–0.07)
Basophils Absolute: 0.1 10*3/uL (ref 0.0–0.1)
Basophils Relative: 1 %
Eosinophils Absolute: 1.5 10*3/uL — ABNORMAL HIGH (ref 0.0–0.5)
Eosinophils Relative: 21 %
HCT: 38.3 % — ABNORMAL LOW (ref 39.0–52.0)
Hemoglobin: 12.5 g/dL — ABNORMAL LOW (ref 13.0–17.0)
Immature Granulocytes: 0 %
Lymphocytes Relative: 23 %
Lymphs Abs: 1.7 10*3/uL (ref 0.7–4.0)
MCH: 26.4 pg (ref 26.0–34.0)
MCHC: 32.6 g/dL (ref 30.0–36.0)
MCV: 80.8 fL (ref 80.0–100.0)
Monocytes Absolute: 0.7 10*3/uL (ref 0.1–1.0)
Monocytes Relative: 10 %
Neutro Abs: 3.2 10*3/uL (ref 1.7–7.7)
Neutrophils Relative %: 45 %
Platelets: 319 10*3/uL (ref 150–400)
RBC: 4.74 MIL/uL (ref 4.22–5.81)
RDW: 15.4 % (ref 11.5–15.5)
WBC: 7.3 10*3/uL (ref 4.0–10.5)
nRBC: 0 % (ref 0.0–0.2)

## 2020-10-18 LAB — BASIC METABOLIC PANEL WITH GFR
Anion gap: 3 — ABNORMAL LOW (ref 5–15)
BUN: 11 mg/dL (ref 6–20)
CO2: 31 mmol/L (ref 22–32)
Calcium: 9.1 mg/dL (ref 8.9–10.3)
Chloride: 101 mmol/L (ref 98–111)
Creatinine, Ser: 0.49 mg/dL — ABNORMAL LOW (ref 0.61–1.24)
GFR, Estimated: >60 mL/min
Glucose, Bld: 81 mg/dL (ref 70–99)
Potassium: 4.3 mmol/L (ref 3.5–5.1)
Sodium: 135 mmol/L (ref 135–145)

## 2020-10-18 LAB — VANCOMYCIN, TROUGH: Vancomycin Tr: 19 ug/mL (ref 15–20)

## 2020-10-22 ENCOUNTER — Encounter (HOSPITAL_COMMUNITY)
Admission: RE | Admit: 2020-10-22 | Discharge: 2020-10-22 | Disposition: A | Discharge status: Home / Self Care | Payer: Medicare Other | Source: Skilled Nursing Facility | Attending: Vascular Surgery | Admitting: Vascular Surgery

## 2020-10-22 DIAGNOSIS — M869 Osteomyelitis, unspecified: Secondary | ICD-10-CM | POA: Insufficient documentation

## 2020-10-22 DIAGNOSIS — Z5181 Encounter for therapeutic drug level monitoring: Secondary | ICD-10-CM | POA: Insufficient documentation

## 2020-10-22 LAB — BASIC METABOLIC PANEL
Anion gap: 7 (ref 5–15)
BUN: 20 mg/dL (ref 6–20)
CO2: 27 mmol/L (ref 22–32)
Calcium: 9.1 mg/dL (ref 8.9–10.3)
Chloride: 100 mmol/L (ref 98–111)
Creatinine, Ser: 0.62 mg/dL (ref 0.61–1.24)
GFR, Estimated: >60 mL/min (ref >60)
Glucose, Bld: 87 mg/dL (ref 70–99)
Potassium: 4.4 mmol/L (ref 3.5–5.1)
Sodium: 134 mmol/L — ABNORMAL LOW (ref 135–145)

## 2020-10-22 LAB — CBC WITH DIFFERENTIAL/PLATELET
Abs Immature Granulocytes: 0.02 10*3/uL (ref 0.00–0.07)
Basophils Absolute: 0.1 10*3/uL (ref 0.0–0.1)
Basophils Relative: 1 %
Eosinophils Absolute: 1.2 10*3/uL — ABNORMAL HIGH (ref 0.0–0.5)
Eosinophils Relative: 15 %
HCT: 37.5 % — ABNORMAL LOW (ref 39.0–52.0)
Hemoglobin: 12.2 g/dL — ABNORMAL LOW (ref 13.0–17.0)
Immature Granulocytes: 0 %
Lymphocytes Relative: 23 %
Lymphs Abs: 1.8 10*3/uL (ref 0.7–4.0)
MCH: 25.8 pg — ABNORMAL LOW (ref 26.0–34.0)
MCHC: 32.5 g/dL (ref 30.0–36.0)
MCV: 79.4 fL — ABNORMAL LOW (ref 80.0–100.0)
Monocytes Absolute: 1 10*3/uL (ref 0.1–1.0)
Monocytes Relative: 13 %
Neutro Abs: 3.8 10*3/uL (ref 1.7–7.7)
Neutrophils Relative %: 48 %
Platelets: 346 10*3/uL (ref 150–400)
RBC: 4.72 MIL/uL (ref 4.22–5.81)
RDW: 15.4 % (ref 11.5–15.5)
WBC: 7.9 10*3/uL (ref 4.0–10.5)
nRBC: 0 % (ref 0.0–0.2)

## 2020-10-22 LAB — VANCOMYCIN, TROUGH: Vancomycin Tr: 17 ug/mL (ref 15–20)

## 2020-11-16 ENCOUNTER — Telehealth: Payer: Self-pay

## 2020-11-16 ENCOUNTER — Other Ambulatory Visit (HOSPITAL_COMMUNITY)
Admission: RE | Admit: 2020-11-16 | Discharge: 2020-11-16 | Disposition: A | Discharge status: Home / Self Care | Payer: Medicare Other | Source: Other Acute Inpatient Hospital | Attending: Urology | Admitting: Urology

## 2020-11-16 DIAGNOSIS — N390 Urinary tract infection, site not specified: Secondary | ICD-10-CM

## 2020-11-16 LAB — URINALYSIS, COMPLETE (UACMP) WITH MICROSCOPIC
Bilirubin Urine: NEGATIVE
Glucose, UA: NEGATIVE mg/dL
Ketones, ur: NEGATIVE mg/dL
Nitrite: NEGATIVE
Protein, ur: NEGATIVE mg/dL
Specific Gravity, Urine: 1.002 — ABNORMAL LOW (ref 1.005–1.030)
pH: 7 (ref 5.0–8.0)

## 2020-11-16 MED ORDER — AMOXICILLIN-POT CLAVULANATE 875-125 MG PO TABS: 1.0000 | ORAL_TABLET | Freq: Two times a day (BID) | ORAL | 0 refills | Status: DC

## 2020-11-16 NOTE — Telephone Encounter (Signed)
Home Health Nurse called our office today.  Janett Billow RN , with [facility], stated she recently saw patient this am and patient temp is 100.3. patient seen his wound care doctor yesterday and [facility] Nurse states they do not feel that is the source of his elevated temp.  Reports a strong foul smell to his urine. Spoke with Dr. Alyson Ingles- order for culture and augmentin '875mg'$  BID for 10 days pending culture.  Rx sent to pharmacy and order for urine culture. Janett Billow, RN voiced understanding of orders and will obtain urine culture today prior to administration of medication.

## 2020-11-18 LAB — URINE CULTURE: Culture: >=100000 — AB

## 2020-11-21 ENCOUNTER — Other Ambulatory Visit: Payer: Self-pay

## 2020-11-22 ENCOUNTER — Telehealth: Payer: Self-pay

## 2020-11-22 MED ORDER — SULFAMETHOXAZOLE-TRIMETHOPRIM 800-160 MG PO TABS: 1.0000 | ORAL_TABLET | Freq: Two times a day (BID) | ORAL | 0 refills | Status: DC

## 2020-11-22 NOTE — Telephone Encounter (Signed)
New prescription sent to pharmacy.  Janett Billow, with Home Health, notified of medication change. Voiced understanding.   Patient called and notified as well. Understands to stop Augmentin and take newly prescribed antibiotic.
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
## 2021-02-11 ENCOUNTER — Ambulatory Visit (HOSPITAL_COMMUNITY): Payer: Medicare Other

## 2021-02-13 ENCOUNTER — Telehealth: Payer: Self-pay

## 2021-02-13 NOTE — Telephone Encounter (Signed)
Pt had his cath changed by care at home. Pt called to cancel his appt on 02-18-21.  Thanks, Helene Kelp

## 2021-02-18 ENCOUNTER — Ambulatory Visit: Payer: Medicare Other | Admitting: Urology

## 2021-02-20 DIAGNOSIS — M869 Osteomyelitis, unspecified: Secondary | ICD-10-CM | POA: Insufficient documentation

## 2021-09-26 DIAGNOSIS — Z742 Need for assistance at home and no other household member able to render care: Secondary | ICD-10-CM | POA: Insufficient documentation

## 2021-09-26 DIAGNOSIS — Z741 Need for assistance with personal care: Secondary | ICD-10-CM | POA: Insufficient documentation

## 2021-10-15 ENCOUNTER — Encounter: Payer: Medicare Other | Attending: Physician Assistant | Admitting: Physician Assistant

## 2021-10-15 DIAGNOSIS — Z931 Gastrostomy status: Secondary | ICD-10-CM | POA: Diagnosis not present

## 2021-10-15 DIAGNOSIS — L89153 Pressure ulcer of sacral region, stage 3: Secondary | ICD-10-CM | POA: Insufficient documentation

## 2021-10-15 DIAGNOSIS — G8254 Quadriplegia, C5-C7 incomplete: Secondary | ICD-10-CM | POA: Insufficient documentation

## 2021-10-15 DIAGNOSIS — L89893 Pressure ulcer of other site, stage 3: Secondary | ICD-10-CM | POA: Diagnosis not present

## 2021-10-15 NOTE — Progress Notes (Signed)
VITOR, OVERBAUGH ([id number]) Visit Report for 10/15/2021 Allergy List Details Patient Name: [name], [name] Date of Service: 10/15/2021 10:00 AM Medical Record Number: [id number] Patient Account Number: [id number] Date of Birth/Sex: Mar 23, 1966 (55 y.o. M) Treating RN: Levora Dredge Primary Care Kaesen Rodriguez: [name] Other Clinician: Referring Siren Porrata: Anselm Pancoast Treating Fredia Chittenden/Extender: Jeri Cos Weeks in Treatment: 0 Allergies Active Allergies niacin shrimp Allergy Notes Electronic Signature(s) Signed: 10/15/2021 4:07:49 PM By: Levora Dredge Entered By: Levora Dredge on 10/15/2021 10:30:50 [name] ([id number]) -------------------------------------------------------------------------------- Arrival Information Details Patient Name: [name] Date of Service: 10/15/2021 10:00 AM Medical Record Number: [id number] Patient Account Number: [id number] Date of Birth/Sex: 02-Feb-1966 (55 y.o. M) Treating RN: Levora Dredge Primary Care [place] Ice: [name] Other Clinician: Referring Enrique Weiss: Anselm Pancoast Treating Victorious Cosio/Extender: Skipper Cliche in Treatment: 0 Visit Information Patient Arrived: Wheel Chair Arrival Time: 10:29 Accompanied By: self Transfer Assistance: Harrel Lemon Lift Patient Identification Verified: Yes Secondary Verification Process Completed: Yes History Since Last Visit Added or deleted any medications: No Any new allergies or adverse reactions: No Had a fall or experienced change in activities of daily living that may affect risk of falls: No Hospitalized since last visit: No Has Dressing in Place as Prescribed: Yes Pain Present Now: No Electronic Signature(s) Signed: 10/15/2021 4:07:49 PM By: Levora Dredge Entered By: Levora Dredge on 10/15/2021 10:29:48 [name] ([id number]) -------------------------------------------------------------------------------- Clinic Level of Care Assessment  Details Patient Name: [name] Date of Service: 10/15/2021 10:00 AM Medical Record Number: [id number] Patient Account Number: [id number] Date of Birth/Sex: [birth year] (55 y.o. M) Treating RN: Levora Dredge Primary Care Esme Durkin: [name] Other Clinician: Referring Avannah Decker: Anselm Pancoast Treating Jalene Lacko/Extender: Skipper Cliche in Treatment: 0 Clinic Level of Care Assessment Items TOOL 2 Quantity Score '[]'$  - Use when only an EandM is performed on the INITIAL visit 0 ASSESSMENTS - Nursing Assessment / Reassessment X - General Physical Exam (combine w/ comprehensive assessment (listed just below) when performed on new 1 20 pt. evals) X- 1 25 Comprehensive Assessment (HX, ROS, Risk Assessments, Wounds Hx, etc.) ASSESSMENTS - Wound and Skin Assessment / Reassessment X - Simple Wound Assessment / Reassessment - one wound 1 5 '[]'$  - 0 Complex Wound Assessment / Reassessment - multiple wounds '[]'$  - 0 Dermatologic / Skin Assessment (not related to wound area) ASSESSMENTS - Ostomy and/or Continence Assessment and Care '[]'$  - Incontinence Assessment and Management 0 '[]'$  - 0 Ostomy Care Assessment and Management (repouching, etc.) PROCESS - Coordination of Care X - Simple Patient / Family Education for ongoing care 1 15 '[]'$  - 0 Complex (extensive) Patient / Family Education for ongoing care X- 1 10 Staff obtains Programmer, systems, Records, Test Results / Process Orders '[]'$  - 0 Staff telephones HHA, Nursing Homes / Clarify orders / etc '[]'$  - 0 Routine Transfer to another Facility (non-emergent condition) '[]'$  - 0 Routine Hospital Admission (non-emergent condition) X- 1 15 New Admissions / Biomedical engineer / Ordering NPWT, Apligraf, etc. '[]'$  - 0 Emergency Hospital Admission (emergent condition) X- 1 10 Simple Discharge Coordination '[]'$  - 0 Complex (extensive) Discharge Coordination PROCESS - Special Needs '[]'$  - Pediatric / Minor Patient Management 0 '[]'$  - 0 Isolation Patient  Management '[]'$  - 0 Hearing / Language / Visual special needs '[]'$  - 0 Assessment of Community assistance (transportation, D/C planning, etc.) '[]'$  - 0 Additional assistance / Altered mentation '[]'$  - 0 Support Surface(s) Assessment (bed, cushion, seat, etc.) INTERVENTIONS - Wound Cleansing / Measurement X - Wound Imaging (photographs - any  number of wounds) 1 5 '[]'$  - 0 Wound Tracing (instead of photographs) X- 1 5 Simple Wound Measurement - one wound '[]'$  - 0 Complex Wound Measurement - multiple wounds Konen, Donterius B. ([id number]) X- 1 5 Simple Wound Cleansing - one wound '[]'$  - 0 Complex Wound Cleansing - multiple wounds INTERVENTIONS - Wound Dressings X - Small Wound Dressing one or multiple wounds 1 10 '[]'$  - 0 Medium Wound Dressing one or multiple wounds '[]'$  - 0 Large Wound Dressing one or multiple wounds '[]'$  - 0 Application of Medications - injection INTERVENTIONS - Miscellaneous '[]'$  - External ear exam 0 '[]'$  - 0 Specimen Collection (cultures, biopsies, blood, body fluids, etc.) '[]'$  - 0 Specimen(s) / Culture(s) sent or taken to Lab for analysis X- 1 10 Patient Transfer (multiple staff / Civil Service fast streamer / Similar devices) '[]'$  - 0 Simple Staple / Suture removal (25 or less) '[]'$  - 0 Complex Staple / Suture removal (26 or more) '[]'$  - 0 Hypo / Hyperglycemic Management (close monitor of Blood Glucose) '[]'$  - 0 Ankle / Brachial Index (ABI) - do not check if billed separately Has the patient been seen at the hospital within the last three years: Yes Total Score: 135 Level Of Care: New/Established - Level 4 Electronic Signature(s) Signed: 10/15/2021 4:07:49 PM By: Levora Dredge Entered By: Levora Dredge on 10/15/2021 12:15:26 [name] ([id number]) -------------------------------------------------------------------------------- Encounter Discharge Information Details Patient Name: [name] Date of Service: 10/15/2021 10:00 AM Medical Record Number:  [id number] Patient Account Number: [id number] Date of Birth/Sex: Feb 14, 1966 (55 y.o. M) Treating RN: Levora Dredge Primary Care Betsaida [place]: [name] Other Clinician: Referring Tiare Rohlman: Anselm Pancoast Treating Ouita Nish/Extender: Skipper Cliche in Treatment: 0 Encounter Discharge Information Items Discharge Condition: Stable Ambulatory Status: Wheelchair Discharge Destination: Home Transportation: Other Accompanied By: self Schedule Follow-up Appointment: Yes Clinical Summary of Care: Electronic Signature(s) Signed: 10/15/2021 12:16:11 PM By: Levora Dredge Entered By: Levora Dredge on 10/15/2021 12:16:11 [name] ([id number]) -------------------------------------------------------------------------------- Lower Extremity Assessment Details Patient Name: [name] Date of Service: 10/15/2021 10:00 AM Medical Record Number: [id number] Patient Account Number: [id number] Date of Birth/Sex: 1965/12/05 (55 y.o. M) Treating RN: Levora Dredge Primary Care Kailand Seda: [name] Other Clinician: Referring Julita Ozbun: Anselm Pancoast Treating Mita Vallo/Extender: Jeri Cos Weeks in Treatment: 0 Electronic Signature(s) Signed: 10/15/2021 4:07:49 PM By: Levora Dredge Entered By: Levora Dredge on 10/15/2021 10:34:18 [name] ([id number]) -------------------------------------------------------------------------------- Multi Wound Chart Details Patient Name: [name] Date of Service: 10/15/2021 10:00 AM Medical Record Number: [id number] Patient Account Number: [id number] Date of Birth/Sex: 10/27/1965 (55 y.o. M) Treating RN: Levora Dredge Primary Care Maximilliano Kersh: [name] Other Clinician: Referring Bane Hagy: Anselm Pancoast Treating Arleny Kruger/Extender: Skipper Cliche in Treatment: 0 Vital Signs Height(in): 73 Pulse(bpm): 60 Weight(lbs): 154 Blood Pressure(mmHg): 4/62 Body Mass Index(BMI): 20.3 Temperature(F): 97.8 Respiratory  Rate(breaths/min): 18 Photos: [N/A:N/A] Wound Location: Distal, Medial Back Sacrum N/A Wounding Event: Pressure Injury Pressure Injury N/A Primary Etiology: Pressure Ulcer Pressure Ulcer N/A Comorbid History: Hypotension, Myocardial Infarction, Hypotension, Myocardial Infarction, N/A History of pressure wounds, History of pressure wounds, Rheumatoid Arthritis, Paraplegia, Rheumatoid Arthritis, Paraplegia, Confinement Anxiety Confinement Anxiety Date Acquired: 10/01/2021 09/05/2021 N/A Weeks of Treatment: 0 0 N/A Wound Status: Open Open N/A Wound Recurrence: No No N/A Measurements L x W x D (cm) 3x4x0.4 3.5x2x1 N/A Area (cm) : 9.425 5.498 N/A Volume (cm) : 3.77 5.498 N/A Starting Position 1 (o'clock): 1 Ending Position 1 (o'clock): 5 Maximum Distance 1 (cm): 1 Undermining: No Yes N/A Classification: Category/Stage III  Category/Stage III N/A Exudate Amount: Medium Medium N/A Exudate Type: Serosanguineous Serosanguineous N/A Exudate Color: red, brown red, brown N/A Granulation Amount: Large (67-100%) Large (67-100%) N/A Granulation Quality: Red, Pink Red N/A Necrotic Amount: Small (1-33%) Small (1-33%) N/A Exposed Structures: Fat Layer (Subcutaneous Tissue): Fat Layer (Subcutaneous Tissue): N/A Yes Yes Epithelialization: Medium (34-66%) None N/A Treatment Notes Electronic Signature(s) Signed: 10/15/2021 12:14:42 PM By: Levora Dredge Entered By: Levora Dredge on 10/15/2021 12:14:42 [name] ([id number]) -------------------------------------------------------------------------------- [place] Details Patient Name: [name] Date of Service: 10/15/2021 10:00 AM Medical Record Number: [id number] Patient Account Number: [id number] Date of Birth/Sex: October 07, 1965 (55 y.o. M) Treating RN: Levora Dredge Primary Care Bradshaw Minihan: [name] Other Clinician: Referring Rolan Wrightsman: Anselm Pancoast Treating Javonne Dorko/Extender: Skipper Cliche in  Treatment: 0 Active Inactive Orientation to the Wound Care Program Nursing Diagnoses: Knowledge deficit related to the wound healing center program Goals: Patient/caregiver will verbalize understanding of the [facility] Program Date Initiated: 10/15/2021 Target Resolution Date: 10/22/2021 Goal Status: Active Interventions: Provide education on orientation to the wound center Notes: Wound/Skin Impairment Nursing Diagnoses: Impaired tissue integrity Knowledge deficit related to ulceration/compromised skin integrity Goals: Ulcer/skin breakdown will have a volume reduction of 30% by week 4 Date Initiated: 10/15/2021 Target Resolution Date: 11/12/2021 Goal Status: Active Ulcer/skin breakdown will have a volume reduction of 50% by week 8 Date Initiated: 10/15/2021 Target Resolution Date: 12/10/2021 Goal Status: Active Ulcer/skin breakdown will have a volume reduction of 80% by week 12 Date Initiated: 10/15/2021 Target Resolution Date: 01/07/2022 Goal Status: Active Ulcer/skin breakdown will heal within 14 weeks Date Initiated: 10/15/2021 Target Resolution Date: 01/21/2022 Goal Status: Active Interventions: Assess patient/caregiver ability to obtain necessary supplies Assess patient/caregiver ability to perform ulcer/skin care regimen upon admission and as needed Assess ulceration(s) every visit Provide education on ulcer and skin care Notes: Electronic Signature(s) Signed: 10/15/2021 4:07:49 PM By: Levora Dredge Entered By: Levora Dredge on 10/15/2021 10:58:25 [name] ([id number]) -------------------------------------------------------------------------------- Pain Assessment Details Patient Name: [name] Date of Service: 10/15/2021 10:00 AM Medical Record Number: [id number] Patient Account Number: [id number] Date of Birth/Sex: Sep 14, 1965 (55 y.o. M) Treating RN: Levora Dredge Primary Care Mahati Vajda: [name] Other Clinician: Referring  Nabiha Planck: Anselm Pancoast Treating Moosa Bueche/Extender: Skipper Cliche in Treatment: 0 Active Problems Location of Pain Severity and Description of Pain Patient Has Paino No Site Locations Rate the pain. Current Pain Level: 0 Pain Management and Medication Current Pain Management: Electronic Signature(s) Signed: 10/15/2021 4:07:49 PM By: Levora Dredge Entered By: Levora Dredge on 10/15/2021 10:29:59 [name] ([id number]) -------------------------------------------------------------------------------- Patient/Caregiver Education Details Patient Name: [name] Date of Service: 10/15/2021 10:00 AM Medical Record Number: [id number] Patient Account Number: [id number] Date of Birth/Gender: [birth year] (55 y.o. M) Treating RN: Levora Dredge Primary Care Physician: [name] Other Clinician: Referring Physician: Anselm Pancoast Treating Physician/Extender: Skipper Cliche in Treatment: 0 Education Assessment Education Provided To: Patient Education Topics Provided Welcome To [facility] [place]: Handouts: Welcome To [facility] Methods: Explain/Verbal Responses: State content correctly Wound/Skin Impairment: Handouts: Caring for Your Ulcer Methods: Explain/Verbal Responses: State content correctly Electronic Signature(s) Signed: 10/15/2021 4:07:49 PM By: Levora Dredge Entered By: Levora Dredge on 10/15/2021 12:15:42 [name] ([id number]) -------------------------------------------------------------------------------- Wound Assessment Details Patient Name: [name] Date of Service: 10/15/2021 10:00 AM Medical Record Number: [id number] Patient Account Number: [id number] Date of Birth/Sex: 1965-12-18 (55 y.o. M) Treating RN: Levora Dredge Primary Care Tim Corriher: [name] Other Clinician: Referring Lianne Carreto: Anselm Pancoast Treating  Ifeoma Vallin/Extender: Jeri Cos Weeks in Treatment: 0 Wound Status Wound Number:  7 Primary Pressure Ulcer Etiology: Wound Location: Distal, Medial Back Wound Open Wounding Event: Pressure Injury Status: Date Acquired: 10/01/2021 Comorbid Hypotension, Myocardial Infarction, History of pressure Weeks Of Treatment: 0 History: wounds, Rheumatoid Arthritis, Paraplegia, Confinement Clustered Wound: No Anxiety Photos Wound Measurements Length: (cm) 3 Width: (cm) 4 Depth: (cm) 0.4 Area: (cm) 9.425 Volume: (cm) 3.77 % Reduction in Area: % Reduction in Volume: Epithelialization: Medium (34-66%) Tunneling: No Undermining: No Wound Description Classification: Category/Stage III Exudate Amount: Medium Exudate Type: Serosanguineous Exudate Color: red, brown Foul Odor After Cleansing: No Slough/Fibrino Yes Wound Bed Granulation Amount: Large (67-100%) Exposed Structure Granulation Quality: Red, Pink Fat Layer (Subcutaneous Tissue) Exposed: Yes Necrotic Amount: Small (1-33%) Necrotic Quality: Adherent Therapist, music) Signed: 10/15/2021 4:07:49 PM By: Levora Dredge Entered By: Levora Dredge on 10/15/2021 10:46:30 [name] ([id number]) -------------------------------------------------------------------------------- Wound Assessment Details Patient Name: [name] Date of Service: 10/15/2021 10:00 AM Medical Record Number: [id number] Patient Account Number: [id number] Date of Birth/Sex: December 14, 1965 (55 y.o. M) Treating RN: Levora Dredge Primary Care Esterlene Atiyeh: [name] Other Clinician: Referring Eldrige Pitkin: Anselm Pancoast Treating Lenvil Swaim/Extender: Skipper Cliche in Treatment: 0 Wound Status Wound Number: 8 Primary Pressure Ulcer Etiology: Wound Location: Sacrum Wound Open Wounding Event: Pressure Injury Status: Date Acquired: 09/05/2021 Comorbid Hypotension, Myocardial Infarction, History of pressure Weeks Of Treatment: 0 History: wounds, Rheumatoid Arthritis, Paraplegia, Confinement Clustered Wound:  No Anxiety Photos Wound Measurements Length: (cm) 3.5 Width: (cm) 2 Depth: (cm) 1 Area: (cm) 5.498 Volume: (cm) 5.498 % Reduction in Area: % Reduction in Volume: Epithelialization: None Tunneling: No Undermining: Yes Starting Position (o'clock): 1 Ending Position (o'clock): 5 Maximum Distance: (cm) 1 Wound Description Classification: Category/Stage III Exudate Amount: Medium Exudate Type: Serosanguineous Exudate Color: red, brown Foul Odor After Cleansing: No Slough/Fibrino Yes Wound Bed Granulation Amount: Large (67-100%) Exposed Structure Granulation Quality: Red Fat Layer (Subcutaneous Tissue) Exposed: Yes Necrotic Amount: Small (1-33%) Necrotic Quality: Adherent Slough Electronic Signature(s) Signed: 10/15/2021 4:07:49 PM By: Levora Dredge Entered By: Levora Dredge on 10/15/2021 10:48:11 [name] ([id number]) -------------------------------------------------------------------------------- Vitals Details Patient Name: [name] Date of Service: 10/15/2021 10:00 AM Medical Record Number: [id number] Patient Account Number: [id number] Date of Birth/Sex: 03-Dec-1965 (55 y.o. M) Treating RN: Levora Dredge Primary Care Kripa Foskey: [name] Other Clinician: Referring Myles Mallicoat: Anselm Pancoast Treating Berma Harts/Extender: Skipper Cliche in Treatment: 0 Vital Signs Time Taken: 10:15 Temperature (F): 97.8 Height (in): 73 Pulse (bpm): 83 Source: Stated Respiratory Rate (breaths/min): 18 Weight (lbs): 154 Blood Pressure (mmHg): 95/62 Source: Stated Reference Range: 80 - 120 mg / dl Body Mass Index (BMI): 20.3 Electronic Signature(s) Signed: 10/15/2021 4:07:49 PM By: Levora Dredge Entered By: Levora Dredge on 10/15/2021 10:30:43

## 2021-10-15 NOTE — Progress Notes (Addendum)
[name], [name] ([id number]) Visit Report for 10/15/2021 Chief Complaint Document Details Patient Name: [name] Date of Service: 10/15/2021 10:00 AM Medical Record Number: [id number] Patient Account Number: [id number] Date of Birth/Sex: [birth year] (56 y.o. M) Treating RN: Levora Dredge Primary Care Provider: [name] Other Clinician: Referring Provider: Anselm Pancoast Treating Provider/Extender: Skipper Cliche in Treatment: 0 Information Obtained from: Patient Chief Complaint Sacral and back pressure ulcers Electronic Signature(s) Signed: 10/15/2021 10:54:28 AM By: Worthy Keeler PA-C Entered By: Worthy Keeler on 10/15/2021 10:54:28 [name] ([id number]) -------------------------------------------------------------------------------- HPI Details Patient Name: [name] Date of Service: 10/15/2021 10:00 AM Medical Record Number: [id number] Patient Account Number: [id number] Date of Birth/Sex: Oct 22, 1965 (56 y.o. M) Treating RN: Levora Dredge Primary Care Provider: [name] Other Clinician: Referring Provider: Anselm Pancoast Treating Provider/Extender: Skipper Cliche in Treatment: 0 History of Present Illness Associated Signs and Symptoms: Patient has a history of confirmed osteomyelitis of the left foot according to MRI July 2019. 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 HPI Description: 12/08/17 on evaluation today patient presents for initial evaluation and our clinic concerning issues he has been having with his feet bilaterally although the left more than right most recently. He was admitted to hospital where he was placed on IV vancomycin which actually he continue to utilize until discharge and even when discharged continue to be on until around 16 August according to what he tells me. His PICC line was removed at that point. Nonetheless he has been seen in  the wound care center in [facility] before transferring to [place] due to the fact that the doctor there left and they are no longer open. Nonetheless he does have significant osteomyelitis of the left foot noted on MRI which revealed significant issues with necrotic bone including a portion of the distal region of his left great toe which was felt to possibly either be missing as a result of amputation or potentially resorption. Either way he also had osteomyelitis noted of the digit, metatarsal region, cuneiform, and navicular bones. There is definite bone exposure noted externally at this point in time. In regard to the right foot he has issues with ulcerations here as well although he states that recently he's had no issues until this just blistered and reopened. Apparently Xeroform was used in this has caused things to be somewhat more moist and open more according to the patient. He's otherwise been tolerating the dressing changes without complication using silver alginate dressings. He has no discomfort but does seem to be extremely stressed regarding the fact that he did see Dr. 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 The patient stated per notes reviewed that he did not want to have any surgery and therefore has a repeat appointment scheduled with the surgeon on 12/15/17. With all that being said he feels like that he really has not been alerted to what was going on in the severity that was until just recently and has a lot of questions in that regard. I'll be see I explained that seeing him for the first time today I cannot answer a lot of those questions that he has. 12/21/17 on evaluation today patient actually appears to be doing a little better in regard to the right foot in particular. There is one area where there still definitive bone noted although the MRI which I did review today that we had  ordered was negative for any evidence of  osteomyelitis which is good news. Nonetheless there was some necrotic bone noted. Overall the patient appears to be doing fairly well however in regard to the right foot. Unfortunately he does have a new open area on the posterior aspect of his left leg as well as the continued area of necrosis noted in the central portion of the foot. He states that he's actually going to be sent for reevaluation specifically to a limb salvage clinic at [facility] he believes this is something that is primary care provider has been working with. They were just awaiting the results of the MRI. 01/08/18 on evaluation today patient actually appears to be doing maybe just a little better in regard to the overall appearance of his bilateral foot ulcers. In general this does not seem to have worsened at least which is good news. He still has evidence of bone noted on the surface of both wound areas which though dry appearing really has not otherwise changed at this time. He actually has an appointment at [facility] with the wound center to discuss limb salvage. He discusses with me at the last visit. Nonetheless he was somewhat upset today about a couple things one being that he stated that we did not put the order in for the protective dressing for his knees that we discussed last time. With that being said I had put in the order for the protective dressing in fact it was on his order sheet that was signed on 12/21/17. Nonetheless unfortunately it appears this was overlooked by home health and they told him if they had the order they could put the protective dressing on. Nonetheless they told him they did not have the order. Again I believe this was an oversight nothing intentional on anyone's part nonetheless the order was there. Still I had explained to the patient sometimes insurance will not cover for the protective dressing even if it is ordered and this was discussed in the last visit. 01/25/18 on evaluation today  patient presents for follow-up concerning his bilateral lower extremity ulcers. He has actually been doing about the same since I last saw him. We do have them in the room we can look at his gluteal region today as well to ensure that there is nothing open or if so address the issue there. Nonetheless he was supposed to see you and see wound care/limb salvage prior to his visit with me today. Unfortunately when he arrived last time at their clinic he apparently was there on the wrong day the doctor was not even present that he was his to be seen. Nonetheless I had to be switched to a different day and the patient actually is going in the next week to see them. Assuming everything works out this should actually be tomorrow. Nonetheless if they take over care of that point then we will subsequently release care to them. Readmission: 10-15-2021 upon evaluation today patient appears to be doing somewhat poorly in regard to wounds over the left gluteal region. He also has areas in the midline sacrum although this is not quite as bad. There is however some evidence of necrotic bone noted at this point unfortunately. The question is whether this is just something working its way out or if it something that is actually actively infected at this point. We are going to have to evaluate that further little by little as we go with additional diagnostics. With that being said the patient  tells me currently that what we are seeing him for has been open since around 09-05-2021 for the gluteal area on the left and in regard to the sacrum 10-01-2021 he states. With that being said he again is not too significant as far as the overall appearance of the wounds are concerned but again with the bone exposed this is something that we may want to do at pathology and culture in regard to. 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 This is a gentleman whom I have previously seen back in 2019. The wound that we were taking care of back at that time is completely cleared. Electronic Signature(s) Signed: 10/18/2021 4:05:18 PM By: Worthy Keeler PA-C Previous Signature: 10/18/2021 4:03:25 PM Version By: Worthy Keeler PA-C Entered By: Worthy Keeler on 10/18/2021 16:05:18 [name] ([id number]) -------------------------------------------------------------------------------- Physical Exam Details Patient Name: [name] Date of Service: 10/15/2021 10:00 AM Medical Record Number: [id number] Patient Account Number: [id number] Date of Birth/Sex: 1966/01/17 (55 y.o. M) Treating RN: Levora Dredge Primary Care Provider: [name] Other Clinician: Referring Provider: Anselm Pancoast Treating Provider/Extender: Skipper Cliche in Treatment: 0 Constitutional patient is hypotensive.. pulse regular and within target range for patient.Marland Kitchen respirations regular, non-labored and within target range for patient.Marland Kitchen temperature within target range for patient.. Well-nourished and well-hydrated in no acute distress. Eyes conjunctiva clear no eyelid edema noted. pupils equal round and reactive to light and accommodation. Ears, Nose, Mouth, and Throat no gross abnormality of ear auricles or external auditory canals. normal hearing noted during conversation. mucus membranes moist. Respiratory normal breathing without difficulty. Musculoskeletal normal gait and posture. no significant deformity or arthritic changes, no loss or range of motion, no clubbing. Psychiatric this patient is able to make decisions and demonstrates good insight into disease process. Alert and Oriented x 3. pleasant and cooperative. Notes Upon inspection patient's wound bed actually showed signs of good granulation in a lot of areas although he does have obviously the wounds open and again there is bone exposed as well  in the sacral region. I am going to perform debridement of clearway some of the bone and then I am going to send this for pathology as well and depending on the results of the pathology as well as culture which I took a piece of the bone for culture we will then proceed with recommendations for treatment based on the findings. The patient voiced understanding. In the meantime we will initiate some recommendations for dressings as well. Electronic Signature(s) Signed: 10/18/2021 4:04:15 PM By: Worthy Keeler PA-C Entered By: Worthy Keeler on 10/18/2021 16:04:15 [name] ([id number]) -------------------------------------------------------------------------------- Physician Orders Details Patient Name: [name] Date of Service: 10/15/2021 10:00 AM Medical Record Number: [id number] Patient Account Number: [id number] Date of Birth/Sex: Jan 30, 1966 (55 y.o. M) Treating RN: Levora Dredge Primary Care Provider: [name] Other Clinician: Referring Provider: Anselm Pancoast Treating Provider/Extender: Skipper Cliche in Treatment: 0 Verbal / Phone Orders: No Diagnosis Coding ICD-10 Coding Code Description L89.153 Pressure ulcer of sacral region, stage 3 L89.893 Pressure ulcer of other site, stage 3 G82.54 Quadriplegia, C5-C7 incomplete Z93.1 Gastrostomy status Follow-up Appointments o Return Appointment in 2 weeks. o Nurse Visit as needed [facility] for wound care. May utilize formulary equivalent dressing for wound treatment orders unless otherwise specified. Home Health  Nurse may visit [facility] to address patientos wound care needs. o Scheduled days for dressing changes to be completed; exception, patient has scheduled wound care visit that day. o **Please direct any NON-WOUND related issues/requests for orders to patient's Primary Care Physician. **If current dressing causes regression in wound  condition, may D/C ordered dressing product/s and apply Normal Saline Moist Dressing daily until next [place] or Other MD appointment. **Notify [facility] of regression in wound condition at [phone]. o Other Home Health Orders/Instructions: - For sacral wound, cut small circular piece of hydrofera blue to fit inside wound and then place a larger square piece over top. Bathing/ Shower/ Hygiene o Wash wounds with antibacterial soap and water. o May shower; gently cleanse wound with antibacterial soap, rinse and pat dry prior to dressing wounds o No tub bath. Off-Loading o Turn and reposition every 2 hours o Other: - relive pressure from heels on bed Wound Treatment Wound #7 - Sacrum Wound Laterality: Medial, Distal Cleanser: Soap and Water 3 x Per Week/30 Days Discharge Instructions: Gently cleanse wound with antibacterial soap, rinse and pat dry prior to dressing wounds Primary Dressing: Hydrofera Blue Ready Transfer Foam, 4x5 (in/in) 3 x Per Week/30 Days Discharge Instructions: Apply Hydrofera Blue Ready to wound bed as directed Secondary Dressing: ABD Pad 5x9 (in/in) 3 x Per Week/30 Days Discharge Instructions: Cover with ABD pad Secured With: Medipore Tape - 37M Medipore H Soft Cloth Surgical Tape, 2x2 (in/yd) 3 x Per Week/30 Days Wound #8 - Gluteus Wound Laterality: Left Cleanser: Soap and Water 3 x Per Week/30 Days Discharge Instructions: Gently cleanse wound with antibacterial soap, rinse and pat dry prior to dressing wounds Primary Dressing: Hydrofera Blue Ready Transfer Foam, 4x5 (in/in) 3 x Per Week/30 Days Discharge Instructions: Apply Hydrofera Blue Ready to wound bed as directed Secondary Dressing: ABD Pad 5x9 (in/in) 3 x Per Week/30 Days Discharge Instructions: Cover with ABD pad [name], Colin B. ([id number]) Secured With: Medipore Tape - 37M Medipore H Soft Cloth Surgical Tape, 2x2 (in/yd) 3 x Per Week/30 Days Electronic  Signature(s) Signed: 10/15/2021 4:07:49 PM By: Levora Dredge Signed: 10/17/2021 4:35:29 PM By: Worthy Keeler PA-C Entered By: Levora Dredge on 10/15/2021 12:14:59 [name] ([id number]) -------------------------------------------------------------------------------- Problem List Details Patient Name: [name] Date of Service: 10/15/2021 10:00 AM Medical Record Number: [id number] Patient Account Number: [id number] Date of Birth/Sex: 1965/10/06 (55 y.o. M) Treating RN: Levora Dredge Primary Care Provider: [name] Other Clinician: Referring Provider: Anselm Pancoast Treating Provider/Extender: Skipper Cliche in Treatment: 0 Active Problems ICD-10 Encounter Code Description Active Date MDM Diagnosis L89.153 Pressure ulcer of sacral region, stage 3 10/15/2021 No Yes L89.893 Pressure ulcer of other site, stage 3 10/15/2021 No Yes G82.54 Quadriplegia, C5-C7 incomplete 10/15/2021 No Yes Z93.1 Gastrostomy status 10/15/2021 No Yes Inactive Problems Resolved Problems Electronic Signature(s) Signed: 10/15/2021 10:54:15 AM By: Worthy Keeler PA-C Previous Signature: 10/15/2021 10:49:39 AM Version By: Worthy Keeler PA-C Previous Signature: 10/15/2021 10:25:51 AM Version By: Worthy Keeler PA-C Entered By: Worthy Keeler on 10/15/2021 10:54:15 [name] ([id number]) -------------------------------------------------------------------------------- Progress Note Details Patient Name: [name] Date of Service: 10/15/2021 10:00 AM Medical Record Number: [id number] Patient Account Number: [id number] Date of Birth/Sex: Aug 04, 1965 (55 y.o. M) Treating RN: Levora Dredge Primary Care Provider: [name] Other Clinician: Referring Provider: Anselm Pancoast Treating Provider/Extender: Skipper Cliche in Treatment: 0 Subjective Chief Complaint Information obtained from Patient Sacral and back pressure ulcers History of Present Illness  (  HPI) The following HPI elements were documented for the patient's wound: Associated Signs and Symptoms: Patient has a history of confirmed osteomyelitis of the left foot according to MRI July 2019. 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 12/08/17 on evaluation today patient presents for initial evaluation and our clinic concerning issues he has been having with his feet bilaterally although the left more than right most recently. He was admitted to hospital where he was placed on IV vancomycin which actually he continue to utilize until discharge and even when discharged continue to be on until around 16 August according to what he tells me. His PICC line was removed at that point. Nonetheless he has been seen in the wound care center in [facility] before transferring to [place] due to the fact that the doctor there left and they are no longer open. Nonetheless he does have significant osteomyelitis of the left foot noted on MRI which revealed significant issues with necrotic bone including a portion of the distal region of his left great toe which was felt to possibly either be missing as a result of amputation or potentially resorption. Either way he also had osteomyelitis noted of the digit, metatarsal region, cuneiform, and navicular bones. There is definite bone exposure noted externally at this point in time. In regard to the right foot he has issues with ulcerations here as well although he states that recently he's had no issues until this just blistered and reopened. Apparently Xeroform was used in this has caused things to be somewhat more moist and open more according to the patient. He's otherwise been tolerating the dressing changes without complication using silver alginate dressings. He has no discomfort but does seem to be extremely stressed regarding the fact that he did see Dr. Sharol Given and apparently an above knee  amputation on the left was recommended. The patient stated per notes reviewed that he did not want to have any surgery and therefore has a repeat appointment scheduled with the surgeon on 12/15/17. With all that being said he feels like that he really has not been alerted to what was going on in the severity that was until just recently and has a lot of questions in that regard. I'll be see I explained that seeing him for the first time today I cannot answer a lot of those questions that he has. 12/21/17 on evaluation today patient actually appears to be doing a little better in regard to the right foot in particular. There is one area where there still definitive bone noted although the MRI which I did review today that we had ordered was negative for any evidence of osteomyelitis which is good news. Nonetheless there was some necrotic bone noted. Overall the patient appears to be doing fairly well however in regard to the right foot. Unfortunately he does have a new open area on the posterior aspect of his left leg as well as the continued area of necrosis noted in the central portion of the foot. He states that he's actually going to be sent for reevaluation specifically to a limb salvage clinic at [facility] he believes this is something that is primary care provider has been working with. They were just awaiting the results of the MRI. 01/08/18 on evaluation today patient actually appears to be doing maybe just a little better in regard to the overall appearance of his bilateral foot ulcers. In general this does not  seem to have worsened at least which is good news. He still has evidence of bone noted on the surface of both wound areas which though dry appearing really has not otherwise changed at this time. He actually has an appointment at [facility] with the wound center to discuss limb salvage. He discusses with me at the last visit. Nonetheless he was somewhat upset today about a couple things one  being that he stated that we did not put the order in for the protective dressing for his knees that we discussed last time. With that being said I had put in the order for the protective dressing in fact it was on his order sheet that was signed on 12/21/17. Nonetheless unfortunately it appears this was overlooked by home health and they told him if they had the order they could put the protective dressing on. Nonetheless they told him they did not have the order. Again I believe this was an oversight nothing intentional on anyone's part nonetheless the order was there. Still I had explained to the patient sometimes insurance will not cover for the protective dressing even if it is ordered and this was discussed in the last visit. 01/25/18 on evaluation today patient presents for follow-up concerning his bilateral lower extremity ulcers. He has actually been doing about the same since I last saw him. We do have them in the room we can look at his gluteal region today as well to ensure that there is nothing open or if so address the issue there. Nonetheless he was supposed to see you and see wound care/limb salvage prior to his visit with me today. Unfortunately when he arrived last time at their clinic he apparently was there on the wrong day the doctor was not even present that he was his to be seen. Nonetheless I had to be switched to a different day and the patient actually is going in the next week to see them. Assuming everything works out this should actually be tomorrow. Nonetheless if they take over care of that point then we will subsequently release care to them. Readmission: 10-15-2021 upon evaluation today patient appears to be doing somewhat poorly in regard to wounds over the left gluteal region. He also has areas in the midline sacrum although this is not quite as bad. There is however some evidence of necrotic bone noted at this point unfortunately. The question is whether this is just  something working its way out or if it something that is actually actively infected at this point. We are going to have to evaluate that further little by little as we go with additional diagnostics. With that being said the patient tells me currently that what we are seeing him for has been open since around 09-05-2021 for the gluteal area on the left and in regard to the sacrum 10-01-2021 he states. With that being said he again is not too significant as far as the overall appearance of the wounds are concerned but again with the bone exposed this is something that we may want to do at pathology and culture in regard to. 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 This is a gentleman whom I have previously seen back in 2019. The wound that we were taking care of back at that time is completely cleared. Patient History Information obtained from Patient. [name], Colin B. ([id number]) Allergies niacin, shrimp Family  History Cancer - Siblings,Mother, Diabetes - Siblings, Heart Disease - Father, Hypertension - Father, Kidney Disease - Siblings, No family history of Lung Disease, Seizures, Stroke, Thyroid Problems, Tuberculosis. Social History Never smoker, Marital Status - Separated, Alcohol Use - Never, Drug Use - No History, Caffeine Use - Moderate. Medical History Eyes Denies history of Cataracts, Glaucoma, Optic Neuritis Ear/Nose/Mouth/Throat Denies history of Chronic sinus problems/congestion, Middle ear problems Hematologic/Lymphatic Denies history of Anemia, Human Immunodeficiency Virus, Lymphedema, Sickle Cell Disease Respiratory Denies history of Aspiration, Asthma, Chronic Obstructive Pulmonary Disease (COPD), Pneumothorax, Sleep Apnea, Tuberculosis Cardiovascular Patient has history of Hypotension, Myocardial Infarction Denies history of Angina, Arrhythmia,  Congestive Heart Failure, Coronary Artery Disease, Deep Vein Thrombosis, Hypertension, Peripheral Arterial Disease, Peripheral Venous Disease, Phlebitis, Vasculitis Gastrointestinal Denies history of Cirrhosis , Colitis, Crohn s, Hepatitis A, Hepatitis B, Hepatitis C Endocrine Denies history of Type I Diabetes, Type II Diabetes Genitourinary Denies history of End Stage Renal Disease Immunological Denies history of Lupus Erythematosus, Raynaud s, Scleroderma Integumentary (Skin) Patient has history of History of pressure wounds Denies history of History of Burn Musculoskeletal Patient has history of Rheumatoid Arthritis Denies history of Osteoarthritis, Osteomyelitis Neurologic Patient has history of Paraplegia - neck down paralysis Denies history of Dementia, Neuropathy, Quadriplegia, Seizure Disorder Psychiatric Patient has history of Confinement Anxiety Denies history of Anorexia/bulimia Medical And Surgical History Notes Musculoskeletal Quadriplegia Review of Systems (ROS) Constitutional Symptoms (General Health) Denies complaints or symptoms of Fatigue, Fever, Chills, Marked Weight Change. Eyes Denies complaints or symptoms of Dry Eyes, Vision Changes, Glasses / Contacts. Ear/Nose/Mouth/Throat Denies complaints or symptoms of Difficult clearing ears, Sinusitis. Hematologic/Lymphatic Denies complaints or symptoms of Bleeding / Clotting Disorders, Human Immunodeficiency Virus. Respiratory Denies complaints or symptoms of Chronic or frequent coughs, Shortness of Breath. Gastrointestinal Denies complaints or symptoms of Frequent diarrhea, Nausea, Vomiting. Endocrine Denies complaints or symptoms of Hepatitis, Thyroid disease, Polydypsia (Excessive Thirst). Genitourinary Denies complaints or symptoms of Kidney failure/ Dialysis, Incontinence/dribbling. Immunological Denies complaints or symptoms of Hives, Itching. Neurologic Denies complaints or symptoms of  Numbness/parasthesias, Focal/Weakness. [name], Colin B. ([id number]) Objective Constitutional patient is hypotensive.. pulse regular and within target range for patient.Marland Kitchen respirations regular, non-labored and within target range for patient.Marland Kitchen temperature within target range for patient.. Well-nourished and well-hydrated in no acute distress. Vitals Time Taken: 10:15 AM, Height: 73 in, Source: Stated, Weight: 154 lbs, Source: Stated, BMI: 20.3, Temperature: 97.8 F, Pulse: 83 bpm, Respiratory Rate: 18 breaths/min, Blood Pressure: 95/62 mmHg. Eyes conjunctiva clear no eyelid edema noted. pupils equal round and reactive to light and accommodation. Ears, Nose, Mouth, and Throat no gross abnormality of ear auricles or external auditory canals. normal hearing noted during conversation. mucus membranes moist. Respiratory normal breathing without difficulty. Musculoskeletal normal gait and posture. no significant deformity or arthritic changes, no loss or range of motion, no clubbing. Psychiatric this patient is able to make decisions and demonstrates good insight into disease process. Alert and Oriented x 3. pleasant and cooperative. General Notes: Upon inspection patient's wound bed actually showed signs of good granulation in a lot of areas although he does have obviously the wounds open and again there is bone exposed as well in the sacral region. I am going to perform debridement of clearway some of the bone and then I am going to send this for pathology as well and depending on the results of the pathology as well as culture which I took a piece of the bone for culture we will then proceed with recommendations for treatment  based on the findings. The patient voiced understanding. In the meantime we will initiate some recommendations for dressings as well. Integumentary (Hair, Skin) Wound #7 status is Open. Original cause of wound was Pressure Injury. The date acquired was: 10/01/2021. The  wound is located on the [place]. The wound measures 3cm length x 4cm width x 0.4cm depth; 9.425cm^2 area and 3.77cm^3 volume. There is Fat Layer (Subcutaneous Tissue) exposed. There is no tunneling or undermining noted. There is a medium amount of serosanguineous drainage noted. There is large (67-100%) red, pink granulation within the wound bed. There is a small (1-33%) amount of necrotic tissue within the wound bed including Adherent Slough. Wound #8 status is Open. Original cause of wound was Pressure Injury. The date acquired was: 09/05/2021. The wound is located on the Left Gluteus. The wound measures 3.5cm length x 2cm width x 1cm depth; 5.498cm^2 area and 5.498cm^3 volume. There is Fat Layer (Subcutaneous Tissue) exposed. There is no tunneling noted, however, there is undermining starting at 1:00 and ending at 5:00 with a maximum distance of 1cm. There is a medium amount of serosanguineous drainage noted. There is large (67-100%) red granulation within the wound bed. There is a small (1-33%) amount of necrotic tissue within the wound bed including Adherent Slough. Assessment Active Problems ICD-10 Pressure ulcer of sacral region, stage 3 Pressure ulcer of other site, stage 3 Quadriplegia, C5-C7 incomplete Gastrostomy status Plan Follow-up Appointments: Return Appointment in 2 weeks. Nurse Visit as needed Home Health: [facility]: - [place] for wound care. May utilize formulary equivalent dressing for wound treatment orders unless otherwise specified. Home Health Nurse may visit PRN to address patient s wound care needs. Scheduled days for dressing changes to be completed; exception, patient has scheduled wound care visit that day. **Please direct any NON-WOUND related issues/requests for orders to patient's Primary Care Physician. **If current dressing causes regression in wound condition, may D/C ordered dressing product/s and apply Normal  Saline Moist Dressing daily until next [facility] or Other MD [name] ([id number]) appointment. **Notify [facility] of regression in wound condition at [phone]. Other Home Health Orders/Instructions: - For sacral wound, cut small circular piece of hydrofera blue to fit inside wound and then place a larger square piece over top. Bathing/ Shower/ Hygiene: Wash wounds with antibacterial soap and water. May shower; gently cleanse wound with antibacterial soap, rinse and pat dry prior to dressing wounds No tub bath. Off-Loading: Turn and reposition every 2 hours Other: - relive pressure from heels on bed WOUND #7: - Sacrum Wound Laterality: Medial, Distal Cleanser: Soap and Water 3 x Per Week/30 Days Discharge Instructions: Gently cleanse wound with antibacterial soap, rinse and pat dry prior to dressing wounds Primary Dressing: Hydrofera Blue Ready Transfer Foam, 4x5 (in/in) 3 x Per Week/30 Days Discharge Instructions: Apply Hydrofera Blue Ready to wound bed as directed Secondary Dressing: ABD Pad 5x9 (in/in) 3 x Per Week/30 Days Discharge Instructions: Cover with ABD pad Secured With: Medipore Tape - 45M Medipore H Soft Cloth Surgical Tape, 2x2 (in/yd) 3 x Per Week/30 Days WOUND #8: - Gluteus Wound Laterality: Left Cleanser: Soap and Water 3 x Per Week/30 Days Discharge Instructions: Gently cleanse wound with antibacterial soap, rinse and pat dry prior to dressing wounds Primary Dressing: Hydrofera Blue Ready Transfer Foam, 4x5 (in/in) 3 x Per Week/30 Days Discharge Instructions: Apply Hydrofera Blue Ready to wound bed as directed Secondary Dressing: ABD Pad 5x9 (in/in) 3 x Per  Week/30 Days Discharge Instructions: Cover with ABD pad Secured With: Medipore Tape - 59M Medipore H Soft Cloth Surgical Tape, 2x2 (in/yd) 3 x Per Week/30 Days 1. I would recommend currently that based on what we are seeing we go ahead and have the patient continue with home  health and we are going to utilize [facility] which I think is can be a good option for him we will send these orders to them. 2. Also can recommend an ABD pad to cover at each location secured with tape. 3. I am also can recommend appropriate and aggressive offloading which I think is good to be of utmost importance to help this area to heal appropriately. We will see patient back for reevaluation in 1 week here in the clinic. If anything worsens or changes patient will contact our office for additional recommendations. Electronic Signature(s) Signed: 10/18/2021 4:05:31 PM By: Worthy Keeler PA-C Previous Signature: 10/18/2021 4:04:50 PM Version By: Worthy Keeler PA-C Entered By: Worthy Keeler on 10/18/2021 16:05:31 [name] ([id number]) -------------------------------------------------------------------------------- ROS/PFSH Details Patient Name: [name] Date of Service: 10/15/2021 10:00 AM Medical Record Number: [id number] Patient Account Number: [id number] Date of Birth/Sex: December 20, 1965 (55 y.o. M) Treating RN: Levora Dredge Primary Care Provider: [name] Other Clinician: Referring Provider: Anselm Pancoast Treating Provider/Extender: Skipper Cliche in Treatment: 0 Information Obtained From Patient Constitutional Symptoms (General Health) Complaints and Symptoms: Negative for: Fatigue; Fever; Chills; Marked Weight Change Eyes Complaints and Symptoms: Negative for: Dry Eyes; Vision Changes; Glasses / Contacts Medical History: Negative for: Cataracts; Glaucoma; Optic Neuritis Ear/Nose/Mouth/Throat Complaints and Symptoms: Negative for: Difficult clearing ears; Sinusitis Medical History: Negative for: Chronic sinus problems/congestion; Middle ear problems Hematologic/Lymphatic Complaints and Symptoms: Negative for: Bleeding / Clotting Disorders; Human Immunodeficiency Virus Medical History: Negative for: Anemia; Human Immunodeficiency  Virus; Lymphedema; Sickle Cell Disease Respiratory Complaints and Symptoms: Negative for: Chronic or frequent coughs; Shortness of Breath Medical History: Negative for: Aspiration; Asthma; Chronic Obstructive Pulmonary Disease (COPD); Pneumothorax; Sleep Apnea; Tuberculosis Gastrointestinal Complaints and Symptoms: Negative for: Frequent diarrhea; Nausea; Vomiting Medical History: Negative for: Cirrhosis ; Colitis; Crohnos; Hepatitis A; Hepatitis B; Hepatitis C Endocrine Complaints and Symptoms: Negative for: Hepatitis; Thyroid disease; Polydypsia (Excessive Thirst) Medical History: Negative for: Type I Diabetes; Type II Diabetes Genitourinary Complaints and Symptoms: Negative for: Kidney failure/ Dialysis; Incontinence/dribbling [name], Colin B. ([id number]) Medical History: Negative for: End Stage Renal Disease Immunological Complaints and Symptoms: Negative for: Hives; Itching Medical History: Negative for: Lupus Erythematosus; Raynaudos; Scleroderma Neurologic Complaints and Symptoms: Negative for: Numbness/parasthesias; Focal/Weakness Medical History: Positive for: Paraplegia - neck down paralysis Negative for: Dementia; Neuropathy; Quadriplegia; Seizure Disorder Cardiovascular Medical History: Positive for: Hypotension; Myocardial Infarction Negative for: Angina; Arrhythmia; Congestive Heart Failure; Coronary Artery Disease; Deep Vein Thrombosis; Hypertension; Peripheral Arterial Disease; Peripheral Venous Disease; Phlebitis; Vasculitis Integumentary (Skin) Medical History: Positive for: History of pressure wounds Negative for: History of Burn Musculoskeletal Medical History: Positive for: Rheumatoid Arthritis Negative for: Osteoarthritis; Osteomyelitis Past Medical History Notes: Quadriplegia Oncologic Psychiatric Medical History: Positive for: Confinement Anxiety Negative for: Anorexia/bulimia Immunizations Pneumococcal Vaccine: Received  Pneumococcal Vaccination: Yes Received Pneumococcal Vaccination On or After 60th Birthday: Yes Implantable Devices No devices added Family and Social History Cancer: Yes - Siblings,Mother; Diabetes: Yes - Siblings; Heart Disease: Yes - Father; Hypertension: Yes - Father; Kidney Disease: Yes - Siblings; Lung Disease: No; Seizures: No; Stroke: No; Thyroid Problems: No; Tuberculosis: No; Never smoker; Marital Status - Separated; Alcohol Use: Never; Drug Use: No History; Caffeine  Use: Moderate; Financial Concerns: No; Food, Clothing or Shelter Needs: No; Support System Lacking: No; Transportation Concerns: No Electronic Signature(s) Signed: 10/15/2021 4:07:49 PM By: Levora Dredge Signed: 10/17/2021 4:35:29 PM By: [name], Colin B. ([id number]) Entered By: Levora Dredge on 10/15/2021 10:32:43 [name] ([id number]) -------------------------------------------------------------------------------- SuperBill Details Patient Name: [name] Date of Service: 10/15/2021 Medical Record Number: [id number] Patient Account Number: [id number] Date of Birth/Sex: Mar 12, 1966 (55 y.o. M) Treating RN: Levora Dredge Primary Care Provider: [name] Other Clinician: Referring Provider: Anselm Pancoast Treating Provider/Extender: Skipper Cliche in Treatment: 0 Diagnosis Coding ICD-10 Codes Code Description [phone] Pressure ulcer of sacral region, stage 3 L89.893 Pressure ulcer of other site, stage 3 G82.54 Quadriplegia, C5-C7 incomplete Z93.1 Gastrostomy status Facility Procedures CPT4 Code: 44920100 Description: 99214 - WOUND CARE VISIT-LEV 4 EST PT Modifier: Quantity: 1 Physician Procedures CPT4 Code: 7121975 Description: 88325 - WC PHYS LEVEL 4 - NEW PT Modifier: Quantity: 1 CPT4 Code: Description: ICD-10 Diagnosis Description L89.153 Pressure ulcer of sacral region, stage 3 L89.893 Pressure ulcer of other site, stage 3 G82.54 Quadriplegia,  C5-C7 incomplete Z93.1 Gastrostomy status Modifier: Quantity: Electronic Signature(s) Signed: 10/18/2021 4:06:43 PM By: Worthy Keeler PA-C Previous Signature: 10/15/2021 12:15:31 PM Version By: Levora Dredge Previous Signature: 10/17/2021 4:35:29 PM Version By: Worthy Keeler PA-C Entered By: Worthy Keeler on 10/18/2021 16:06:43

## 2021-10-15 NOTE — Progress Notes (Signed)
[name], [name] ([id number]) Visit Report for 10/15/2021 Abuse Risk Screen Details Patient Name: [name], [name] Date of Service: 10/15/2021 10:00 AM Medical Record Number: [id number] Patient Account Number: [id number] Date of Birth/Sex: 09-26-1965 (55 y.o. M) Treating RN: Levora Dredge Primary Care Mariany Mackintosh: [name] Other Clinician: Referring Idalie Canto: Anselm Pancoast Treating Moira Umholtz/Extender: Skipper Cliche in Treatment: 0 Abuse Risk Screen Items Answer ABUSE RISK SCREEN: Has anyone close to you tried to hurt or harm you recentlyo No Do you feel uncomfortable with anyone in your familyo No Has anyone forced you do things that you didnot want to doo No Electronic Signature(s) Signed: 10/15/2021 4:07:49 PM By: Levora Dredge Entered By: Levora Dredge on 10/15/2021 10:32:49 [name] ([id number]) -------------------------------------------------------------------------------- Activities of Daily Living Details Patient Name: [name] Date of Service: 10/15/2021 10:00 AM Medical Record Number: [id number] Patient Account Number: [id number] Date of Birth/Sex: 03-Jan-1966 (55 y.o. M) Treating RN: Levora Dredge Primary Care Kennon Encinas: [name] Other Clinician: Referring Angele Wiemann: Anselm Pancoast Treating Kaitlan Bin/Extender: Skipper Cliche in Treatment: 0 Activities of Daily Living Items Answer Activities of Daily Living (Please select one for each item) Drive Automobile Not Able Take Medications Need Assistance Use Telephone Need Assistance Care for Appearance Need Assistance Use Toilet Need Assistance Bath / Shower Need Assistance Dress Self Need Assistance Feed Self Need Assistance Walk Not Able Get In / Out Bed Need Assistance Housework Need Assistance Prepare Meals Need Assistance Handle Money Need Assistance Shop for Self Need Assistance Electronic Signature(s) Signed: 10/15/2021 4:07:49 PM By: Levora Dredge Entered By:  Levora Dredge on 10/15/2021 10:33:14 [name] ([id number]) -------------------------------------------------------------------------------- Education Screening Details Patient Name: [name] Date of Service: 10/15/2021 10:00 AM Medical Record Number: [id number] Patient Account Number: [id number] Date of Birth/Sex: 07-Jul-1965 (55 y.o. M) Treating RN: Levora Dredge Primary Care Yogi Arther: [name] Other Clinician: Referring Shanzay Hepworth: Anselm Pancoast Treating Jeanell Mangan/Extender: Skipper Cliche in Treatment: 0 Learning Preferences/Education Level/Primary Language Learning Preference: Explanation, Demonstration, Video, Communication Board, Printed Material Preferred Language: English Cognitive Barrier Language Barrier: No Translator Needed: No Memory Deficit: No Emotional Barrier: No Cultural/Religious Beliefs Affecting Medical Care: No Physical Barrier Impaired Vision: No Impaired Hearing: No Decreased Hand dexterity: No Knowledge/Comprehension Knowledge Level: High Comprehension Level: High Ability to understand written instructions: High Ability to understand verbal instructions: High Motivation Anxiety Level: Calm Cooperation: Cooperative Education Importance: Acknowledges Need Interest in Health Problems: Asks Questions Perception: Coherent Willingness to Engage in Self-Management High Activities: Readiness to Engage in Self-Management High Activities: Electronic Signature(s) Signed: 10/15/2021 4:07:49 PM By: Levora Dredge Entered By: Levora Dredge on 10/15/2021 10:33:35 [name] ([id number]) -------------------------------------------------------------------------------- Fall Risk Assessment Details Patient Name: [name] Date of Service: 10/15/2021 10:00 AM Medical Record Number: [id number] Patient Account Number: [id number] Date of Birth/Sex: Feb 09, 1966 (55 y.o. M) Treating RN: Levora Dredge Primary Care  Emrah Ariola: [name] Other Clinician: Referring Munirah Doerner: Anselm Pancoast Treating Trendon Zaring/Extender: Skipper Cliche in Treatment: 0 Fall Risk Assessment Items Have you had 2 or more falls in the last 12 monthso 0 No Have you had any fall that resulted in injury in the last 12 monthso 0 No FALLS RISK SCREEN History of falling - immediate or within 3 months 0 No Secondary diagnosis (Do you have 2 or more medical diagnoseso) 0 No Ambulatory aid None/bed rest/wheelchair/nurse 0 Yes Crutches/cane/walker 0 No Furniture 0 No Intravenous therapy Access/Saline/Heparin Lock 0 No Gait/Transferring Normal/ bed rest/ wheelchair 0 Yes Weak (short steps with or without shuffle, stooped but able to  lift head while walking, may 0 No seek support from furniture) Impaired (short steps with shuffle, may have difficulty arising from chair, head down, impaired 0 No balance) Mental Status Oriented to own ability 0 Yes Electronic Signature(s) Signed: 10/15/2021 4:07:49 PM By: Levora Dredge Entered By: Levora Dredge on 10/15/2021 10:33:44 [name] ([id number]) -------------------------------------------------------------------------------- Foot Assessment Details Patient Name: [name] Date of Service: 10/15/2021 10:00 AM Medical Record Number: [id number] Patient Account Number: [id number] Date of Birth/Sex: 23-Sep-1965 (55 y.o. M) Treating RN: Levora Dredge Primary Care Namya Voges: [name] Other Clinician: Referring Yanina Knupp: Anselm Pancoast Treating Jakeira Seeman/Extender: Skipper Cliche in Treatment: 0 Foot Assessment Items Site Locations + = Sensation present, - = Sensation absent, C = Callus, U = Ulcer R = Redness, W = Warmth, M = Maceration, PU = Pre-ulcerative lesion F = Fissure, S = Swelling, D = Dryness Assessment Right: Left: Other Deformity: No No Prior Foot Ulcer: No No Prior Amputation: No No Charcot Joint: No No Ambulatory Status: Gait: Notes not  completed, wound located on sacrum Electronic Signature(s) Signed: 10/15/2021 4:07:49 PM By: Levora Dredge Entered By: Levora Dredge on 10/15/2021 10:34:08 [name] ([id number]) -------------------------------------------------------------------------------- Nutrition Risk Screening Details Patient Name: [name] Date of Service: 10/15/2021 10:00 AM Medical Record Number: [id number] Patient Account Number: [id number] Date of Birth/Sex: [birth year] (55 y.o. M) Treating RN: Levora Dredge Primary Care Lorie Melichar: [name] Other Clinician: Referring Arek Spadafore: Anselm Pancoast Treating Kaytlin Burklow/Extender: Skipper Cliche in Treatment: 0 Height (in): 73 Weight (lbs): 154 Body Mass Index (BMI): 20.3 Nutrition Risk Screening Items Score Screening NUTRITION RISK SCREEN: I have an illness or condition that made me change the kind and/or amount of food I eat 0 No I eat fewer than two meals per day 0 No I eat few fruits and vegetables, or milk products 0 No I have three or more drinks of beer, liquor or wine almost every day 0 No I have tooth or mouth problems that make it hard for me to eat 0 No I don't always have enough money to buy the food I need 0 No I eat alone most of the time 0 No I take three or more different prescribed or over-the-counter drugs a day 0 No Without wanting to, I have lost or gained 10 pounds in the last six months 0 No I am not always physically able to shop, cook and/or feed myself 0 No Nutrition Protocols Good Risk Protocol 0 No interventions needed Moderate Risk Protocol High Risk Proctocol Risk Level: Good Risk Score: 0 Electronic Signature(s) Signed: 10/15/2021 4:07:49 PM By: Levora Dredge Entered By: Levora Dredge on 10/15/2021 10:33:50

## 2021-10-29 ENCOUNTER — Encounter: Payer: Medicare Other | Admitting: Internal Medicine

## 2021-10-29 DIAGNOSIS — L89153 Pressure ulcer of sacral region, stage 3: Secondary | ICD-10-CM | POA: Diagnosis not present

## 2021-10-30 NOTE — Progress Notes (Signed)
Colin, [name] ([id number]) Visit Report for 10/29/2021 Arrival Information Details Patient Name: [name], [name] Date of Service: 10/29/2021 2:30 PM Medical Record Number: [id number] Patient Account Number: [id number] Date of Birth/Sex: June 26, 1965 (56 y.o. Malee) Treating RN: Cornell Barman Primary Care Guliana Weyandt: [name] Other Clinician: Referring Qualyn Oyervides: Anselm Pancoast Treating Marlen Mollica/Extender: Tito Dine in Treatment: 2 Visit Information History Since Last Visit Added or deleted any medications: No Patient Arrived: Wheel Chair Has Dressing in Place as Prescribed: Yes Arrival Time: 14:30 Pain Present Now: No Accompanied By: self Transfer Assistance: Collins Patient Identification Verified: Yes Secondary Verification Process Completed: Yes Electronic Signature(s) Signed: 10/30/2021 4:29:23 PM By: Gretta Cool, BSN, RN, CWS, Kim RN, BSN Entered By: Gretta Cool, BSN, RN, CWS, Kim on 10/29/2021 14:47:58 [name] ([id number]) -------------------------------------------------------------------------------- Encounter Discharge Information Details Patient Name: [name] Date of Service: 10/29/2021 2:30 PM Medical Record Number: [id number] Patient Account Number: [id number] Date of Birth/Sex: 21-Sep-1965 (56 y.o. Male) Treating RN: Cornell Barman Primary Care Kaydie Petsch: [name] Other Clinician: Referring Tess Potts: Anselm Pancoast Treating Macaela Presas/Extender: Tito Dine in Treatment: 2 Encounter Discharge Information Items Post Procedure Vitals Discharge Condition: Stable Temperature (F): 99.6 Ambulatory Status: Ambulatory Pulse (bpm): 106 Discharge Destination: Home Respiratory Rate (breaths/min): 16 Transportation: Private Auto Blood Pressure (mmHg): 107/72 Accompanied By: self Schedule Follow-up Appointment: Yes Clinical Summary of Care: Electronic Signature(s) Signed: 10/30/2021 4:29:23 PM By: Gretta Cool, BSN, RN, CWS, Kim RN,  BSN Entered By: Gretta Cool, BSN, RN, CWS, Kim on 10/29/2021 15:16:10 [name] ([id number]) -------------------------------------------------------------------------------- Lower Extremity Assessment Details Patient Name: [name] Date of Service: 10/29/2021 2:30 PM Medical Record Number: [id number] Patient Account Number: [id number] Date of Birth/Sex: November 14, 1965 (56 y.o. Malee) Treating RN: Cornell Barman Primary Care Sabastien Tyler: [name] Other Clinician: Referring Caliber Landess: Anselm Pancoast Treating Kanyon Bunn/Extender: Tito Dine in Treatment: 2 Electronic Signature(s) Signed: 10/30/2021 4:29:23 PM By: Gretta Cool, BSN, RN, CWS, Kim RN, BSN Entered By: Gretta Cool, BSN, RN, CWS, Kim on 10/29/2021 14:54:50 [name] ([id number]) -------------------------------------------------------------------------------- Multi Wound Chart Details Patient Name: [name] Date of Service: 10/29/2021 2:30 PM Medical Record Number: [id number] Patient Account Number: [id number] Date of Birth/Sex: [birth year] (56 y.o. 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:Pressure Ulcer] [8:Pressure Ulcer] [N/A:N/A] Comorbid History: [7:Hypotension, Myocardial Infarction, History of pressure wounds, Rheumatoid Arthritis, Paraplegia, Confinement Anxiety] [8:Hypotension, Myocardial Infarction, History of pressure wounds, Rheumatoid Arthritis, Paraplegia, Confinement   Anxiety] [N/A:N/A] Date Acquired: [7:10/01/2021] [8:09/05/2021] [N/A:N/A] Weeks of Treatment: [7:2] [8:2] [N/A:N/A] Wound Status: [7:Open] [8:Open] [N/A:N/A] Wound Recurrence: [7:No] [8:No] [N/A:N/A] Measurements L x W x D (cm) [7:3x4.5x1] [8:3x1.5x1] [N/A:N/A] Area (cm) : [7:10.603] [8:3.534] [N/A:N/A] Volume (cm) : [7:10.603] [8:3.534] [N/A:N/A] % Reduction in Area: [7:-12.50%] [8:35.70%] [N/A:N/A] % Reduction in Volume: [7:-181.20%] [8:35.70%] [N/A:N/A] Classification: [7:Category/Stage III] [8:Category/Stage III] [N/A:N/A] Exudate Amount: [7:Medium] [8:Medium] [N/A:N/A] Exudate Type: [7:Serosanguineous] [8:Serosanguineous] [N/A:N/A] Exudate Color: [7:red, brown] [8:red, brown] [N/A:N/A] Granulation Amount: [7:Large (67-100%)] [8:Large (67-100%)] [N/A:N/A] Granulation Quality: [7:Red, Pink] [8:Red] [N/A:N/A] Necrotic Amount: [7:Small (1-33%)] [8:Small (1-33%)] [N/A:N/A] Exposed Structures: [7:Fat Layer (Subcutaneous Tissue): Yes Medium (34-66%)] [8:Fat Layer (Subcutaneous Tissue): Yes None] [N/A:N/A N/A] Treatment Notes Electronic Signature(s) Signed:  10/30/2021 4:29:23 PM By: Gretta Cool, BSN, RN, CWS, Kim RN, BSN Entered By: Gretta Cool, BSN, RN, CWS, Kim on 10/29/2021 15:12:49 [name] ([id number]) -------------------------------------------------------------------------------- [place] Details Patient Name: [name] Date of Service: 10/29/2021 2:30 PM Medical Record Number: [id number] Patient Account Number: [id number] Date of Birth/Sex: [birth year] (56 y.o. Male) Treating RN: Cornell Barman Primary Care Epsie Walthall: [name] Other Clinician: Referring Azalie Harbeck: Anselm Pancoast Treating Gigi Onstad/Extender: Tito Dine in Treatment: 2 Active Inactive Orientation to the Wound Care Program Nursing Diagnoses: Knowledge deficit related to the wound healing center program Goals: Patient/caregiver will verbalize understanding of the [facility] Date Initiated: 10/15/2021 Target Resolution Date: 10/22/2021 Goal Status: Active Interventions: Provide education on orientation to the wound center Notes: Wound/Skin Impairment Nursing Diagnoses: Impaired tissue integrity Knowledge deficit related to ulceration/compromised skin integrity Goals: Ulcer/skin breakdown will have a volume reduction of 30% by week 4 Date Initiated: 10/15/2021 Target Resolution Date: 11/12/2021 Goal Status: Active Ulcer/skin breakdown will have a volume reduction of 50% by week 8 Date Initiated: 10/15/2021 Target Resolution Date: 12/10/2021 Goal Status: Active Ulcer/skin breakdown will have a volume reduction of 80% by week 12 Date Initiated: 10/15/2021 Target Resolution Date: 01/07/2022 Goal Status: Active Ulcer/skin breakdown will heal within 14 weeks Date Initiated: 10/15/2021 Target Resolution Date: 01/21/2022 Goal Status: Active Interventions: Assess patient/caregiver ability to obtain necessary supplies Assess patient/caregiver ability to perform ulcer/skin care regimen upon admission and as needed Assess ulceration(s) [name] visit Provide education on ulcer and skin care Notes: Electronic Signature(s) Signed: 10/30/2021 4:29:23 PM By: Gretta Cool, BSN, RN, CWS, Kim RN, BSN Entered By: Gretta Cool, BSN, RN, CWS, Kim on 10/29/2021 15:12:42 [name] ([id number]) -------------------------------------------------------------------------------- Pain Assessment Details Patient Name: [name] Date of Service: 10/29/2021 2:30 PM Medical Record Number: [id number] Patient Account Number: [id number] Date of Birth/Sex: [birth year] (55 y.o. 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 Male) Treating RN: Cornell Barman Primary Care Physician: [name] Other Clinician: Referring Physician: Anselm Pancoast Treating Physician/Extender: Tito Dine in Treatment: 2 Education Assessment Education Provided To: Patient Education Topics Provided Wound Debridement: Handouts: Wound Debridement Methods: Demonstration, Explain/Verbal Responses: State content correctly Electronic Signature(s) Signed: 10/30/2021 4:29:23 PM By: Gretta Cool, BSN, RN, CWS, Kim RN, BSN Entered By: Gretta Cool, BSN, RN, CWS, Kim on 10/29/2021 15:15:23 [name] ([id number]) -------------------------------------------------------------------------------- Wound Assessment Details Patient Name: [name] Date of Service: 10/29/2021 2:30 PM Medical Record Number: [id number] Patient Account Number: [id number] Date of Birth/Sex: 1966/04/02 (55 y.o. Male) Treating RN: Cornell Barman Primary Care Sonna Lipsky: [name] Other Clinician: Referring Rufino Staup: Anselm Pancoast Treating Kiylee Thoreson/Extender: Ricard Dillon Weeks in Treatment: 2 Wound Status Wound Number: 7 Primary Pressure Ulcer Etiology: Wound Location: Distal, Medial Sacrum Wound Open Wounding Event: Pressure Injury Status: Date Acquired: 10/01/2021 Comorbid Hypotension, Myocardial Infarction, History of pressure Weeks Of Treatment: 2 History: wounds,  Rheumatoid Arthritis, Paraplegia, Confinement  Clustered Wound: No Anxiety Wound Measurements Length: (cm) 3 Width: (cm) 4.5 Depth: (cm) 1 Area: (cm) 10.603 Volume: (cm) 10.603 % Reduction in Area: -12.5% % Reduction in Volume: -181.2% Epithelialization: Medium (34-66%) Tunneling: No Undermining: No Wound Description Classification: Category/Stage III Exudate Amount: Medium Exudate Type: Serosanguineous Exudate Color: red, brown Foul Odor After Cleansing: No Slough/Fibrino Yes Wound Bed Granulation Amount: Large (67-100%) Exposed Structure Granulation Quality: Red, Pink Fat Layer (Subcutaneous Tissue) Exposed: Yes Necrotic Amount: Small (1-33%) Necrotic Quality: Adherent Slough Treatment Notes Wound #7 (Sacrum) Wound Laterality: Medial, Distal Cleanser Soap and Water Discharge Instruction: Gently cleanse wound with antibacterial soap, rinse and pat dry prior to dressing wounds Peri-Wound Care Topical Primary Dressing Hydrofera Blue Ready Transfer Foam, 4x5 (in/in) Discharge Instruction: Apply Hydrofera Blue Ready to wound bed as directed Secondary Dressing ABD Pad 5x9 (in/in) Discharge Instruction: Cover with ABD pad Secured With Medipore Tape - 51M Medipore H Soft Cloth Surgical Tape, 2x2 (in/yd) Compression Wrap Compression Stockings Add-Ons CAMILA, [place] ([id number]) Electronic Signature(s) Signed: 10/30/2021 4:29:23 PM By: Gretta Cool, BSN, RN, CWS, Kim RN, BSN Entered By: Gretta Cool, BSN, RN, CWS, Kim on 10/29/2021 14:54:25 [name] ([id number]) -------------------------------------------------------------------------------- Wound Assessment Details Patient Name: [name] Date of Service: 10/29/2021 2:30 PM Medical Record Number: [id number] Patient Account Number: [id number] Date of Birth/Sex: [birth year] (55 y.o. Male) Treating RN: Cornell Barman Primary Care Stephaney Steven: [name] Other Clinician: Referring Izetta Sakamoto: Anselm Pancoast Treating  Keslee Harrington/Extender: Ricard Dillon Weeks in Treatment: 2 Wound Status Wound Number: 8 Primary Pressure Ulcer Etiology: Wound Location: Left Gluteus Wound Open Wounding Event: Pressure Injury Status: Date Acquired: 09/05/2021 Comorbid Hypotension, Myocardial Infarction, History of pressure Weeks Of Treatment: 2 History: wounds, Rheumatoid Arthritis, Paraplegia, Confinement Clustered Wound: No Anxiety Wound Measurements Length: (cm) 3 Width: (cm) 1.5 Depth: (cm) 1 Area: (cm) 3.534 Volume: (cm) 3.534 % Reduction in Area: 35.7% % Reduction in Volume: 35.7% Epithelialization: None Wound Description Classification: Category/Stage III Exudate Amount: Medium Exudate Type: Serosanguineous Exudate Color: red, brown Foul Odor After Cleansing: No Slough/Fibrino Yes Wound Bed Granulation Amount: Large (67-100%) Exposed Structure Granulation Quality: Red Fat Layer (Subcutaneous Tissue) Exposed: Yes Necrotic Amount: Small (1-33%) Necrotic Quality: Adherent Slough Treatment Notes Wound #8 (Gluteus) Wound Laterality: Left Cleanser Soap and Water Discharge Instruction: Gently cleanse wound with antibacterial soap, rinse and pat dry prior to dressing wounds Peri-Wound Care Topical Primary Dressing Hydrofera Blue Ready Transfer Foam, 4x5 (in/in) Discharge Instruction: Apply Hydrofera Blue Ready to wound bed as directed Secondary Dressing ABD Pad 5x9 (in/in) Discharge Instruction: Cover with ABD pad Secured With Medipore Tape - 51M Medipore H Soft Cloth Surgical Tape, 2x2 (in/yd) Compression Wrap Compression Stockings Add-Ons PORTER, MOES ([id number]) Electronic Signature(s) Signed: 10/30/2021 4:29:23 PM By: Gretta Cool, BSN, RN, CWS, Kim RN, BSN Entered By: Gretta Cool, BSN, RN, CWS, Kim on 10/29/2021 14:54:36 [name] ([id number]) -------------------------------------------------------------------------------- [place] Details Patient Name: [name] Date of Service: 10/29/2021 2:30 PM Medical Record Number: [id number] Patient Account Number: [id number] Date of Birth/Sex: [birth year] (55 y.o. Male) Treating RN: Cornell Barman Primary Care Jerzee Jerome: [name] Other Clinician: Referring Love Chowning: Anselm Pancoast Treating Che Rachal/Extender: Ricard Dillon Weeks in Treatment: 2 Vital Signs Time Taken: 14:28 Temperature (F): 99.6 Height (in): 73 Pulse (bpm): 106 Weight (lbs): 154 Respiratory Rate (breaths/min): 18 Body Mass Index (BMI): 20.3 Blood Pressure (mmHg): 107/72 Reference Range: 80 - 120 mg / dl Electronic Signature(s) Signed: 10/30/2021 4:29:23 PM By: Gretta Cool, BSN, RN, CWS, Kim RN, BSN Entered By: Gretta Cool, BSN,  RN, Marcy Siren on 10/29/2021 14:48:26

## 2021-10-30 NOTE — Progress Notes (Signed)
MISTER, KRAHENBUHL ([id number]) Visit Report for 10/29/2021 Debridement Details Patient Name: [name], [name] Date of Service: 10/29/2021 2:30 PM Medical Record Number: [id number] Patient Account Number: [id number] Date of Birth/Sex: [birth year] (55 y.o. Male) Treating RN: Cornell Barman Primary Care Provider: [name] Other Clinician: Referring Provider: Anselm Pancoast Treating Provider/Extender: Tito Dine in Treatment: 2 Debridement Performed for Wound #7 Distal,Medial Sacrum Assessment: Performed By: Physician Ricard Dillon, MD Debridement Type: Debridement Level of Consciousness (Pre- Awake and Alert procedure): Pre-procedure Verification/Time Out Yes - 15:00 Taken: Total Area Debrided (L x W): 3 (cm) x 4.5 (cm) = 13.5 (cm) Tissue and other material Viable, Non-Viable, Slough, Subcutaneous, Slough debrided: Level: Skin/Subcutaneous Tissue Debridement Description: Excisional Instrument: Curette Bleeding: Moderate Hemostasis Achieved: Pressure Response to Treatment: Procedure was tolerated well Level of Consciousness (Post- Awake and Alert procedure): Post Debridement Measurements of Total Wound Length: (cm) 3 Stage: Category/Stage III Width: (cm) 4.5 Depth: (cm) 0.3 Volume: (cm) 3.181 Character of Wound/Ulcer Post Debridement: Stable Post Procedure Diagnosis Same as Pre-procedure Electronic Signature(s) Signed: 10/29/2021 4:13:45 PM By: Linton Ham MD Signed: 10/30/2021 4:29:23 PM By: Gretta Cool, BSN, RN, CWS, Kim RN, BSN Entered By: Gretta Cool, BSN, RN, CWS, Kim on 10/29/2021 15:13:40 [name] ([id number]) -------------------------------------------------------------------------------- HPI Details Patient Name: [name] Date of Service: 10/29/2021 2:30 PM Medical Record Number: [id number] Patient Account Number: [id number] Date of Birth/Sex: [birth year] (55 y.o. Male) Treating RN: Cornell Barman Primary Care Provider: [name] Other Clinician: Referring Provider: Anselm Pancoast Treating Provider/Extender: Ricard Dillon Weeks in Treatment: 2 History of Present Illness Associated Signs and Symptoms: Patient has a history of confirmed osteomyelitis of the left foot according to MRI July 2019. 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 HPI Description: 12/08/17 on evaluation today patient presents for initial evaluation and our clinic concerning issues he has been having with his feet bilaterally although the left more than right most recently. He was admitted to hospital where he was placed on IV vancomycin which actually he continue to utilize until discharge and even when discharged continue to be on until around 16 August according to what he tells me. His PICC line was removed at that point. Nonetheless he has been seen in the wound care center in [facility] before transferring to [place] due to the fact that the doctor there left and they are no longer open. Nonetheless he does have significant osteomyelitis of the left foot noted on MRI which revealed significant issues with necrotic bone including a portion of the distal region of his left great toe which was felt to possibly either be missing as a result of amputation or potentially resorption. Either way he also had osteomyelitis noted of the digit, metatarsal region, cuneiform, and navicular bones. There is definite bone exposure noted externally at this point in time. In regard to the right foot he has issues with ulcerations here as well although he states that recently he's had no issues until this just blistered and reopened. Apparently Xeroform was used in this has caused things to be somewhat more moist and open more according to the patient. He's otherwise been tolerating the dressing changes without complication using silver alginate dressings. He has no discomfort but does seem to  be extremely stressed regarding the fact that he did see Dr. 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 The patient stated per notes reviewed that he did  not want to have any surgery and therefore has a repeat appointment scheduled with the surgeon on 12/15/17. With all that being said he feels like that he really has not been alerted to what was going on in the severity that was until just recently and has a lot of questions in that regard. I'll be see I explained that seeing him for the first time today I cannot answer a lot of those questions that he has. 12/21/17 on evaluation today patient actually appears to be doing a little better in regard to the right foot in particular. There is one area where there still definitive bone noted although the MRI which I did review today that we had ordered was negative for any evidence of osteomyelitis which is good news. Nonetheless there was some necrotic bone noted. Overall the patient appears to be doing fairly well however in regard to the right foot. Unfortunately he does have a new open area on the posterior aspect of his left leg as well as the continued area of necrosis noted in the central portion of the foot. He states that he's actually going to be sent for reevaluation specifically to a limb salvage clinic at [facility] he believes this is something that is primary care provider has been working with. They were just awaiting the results of the MRI. 01/08/18 on evaluation today patient actually appears to be doing maybe just a little better in regard to the overall appearance of his bilateral foot ulcers. In general this does not seem to have worsened at least which is good news. He still has evidence of bone noted on the surface of both wound areas which though dry appearing really has not otherwise changed at this time. He actually has an appointment at [facility] with the wound center to discuss limb salvage. He  discusses with me at the last visit. Nonetheless he was somewhat upset today about a couple things one being that he stated that we did not put the order in for the protective dressing for his knees that we discussed last time. With that being said I had put in the order for the protective dressing in fact it was on his order sheet that was signed on 12/21/17. Nonetheless unfortunately it appears this was overlooked by home health and they told him if they had the order they could put the protective dressing on. Nonetheless they told him they did not have the order. Again I believe this was an oversight nothing intentional on anyone's part nonetheless the order was there. Still I had explained to the patient sometimes insurance will not cover for the protective dressing even if it is ordered and this was discussed in the last visit. 01/25/18 on evaluation today patient presents for follow-up concerning his bilateral lower extremity ulcers. He has actually been doing about the same since I last saw him. We do have them in the room we can look at his gluteal region today as well to ensure that there is nothing open or if so address the issue there. Nonetheless he was supposed to see you and see wound care/limb salvage prior to his visit with me today. Unfortunately when he arrived last time at their clinic he apparently was there on the wrong day the doctor was not even present that he was his to be seen. Nonetheless I had to be switched to a different day and the patient actually is going in the next week to see them. Assuming everything works  out this should actually be tomorrow. Nonetheless if they take over care of that point then we will subsequently release care to them. Readmission: 10-15-2021 upon evaluation today patient appears to be doing somewhat poorly in regard to wounds over the left gluteal region. He also has areas in the midline sacrum although this is not quite as bad. There is however  some evidence of necrotic bone noted at this point unfortunately. The question is whether this is just something working its way out or if it something that is actually actively infected at this point. We are going to have to evaluate that further little by little as we go with additional diagnostics. With that being said the patient tells me currently that what we are seeing him for has been open since around 09-05-2021 for the gluteal area on the left and in regard to the sacrum 10-01-2021 he states. With that being said he again is not too significant as far as the overall appearance of the wounds are concerned but again with the bone exposed this is something that we may want to do at pathology and culture in regard to. 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 This is a gentleman whom I have previously seen back in 2019. The wound that we were taking care of back at that time is completely cleared. 7/25; 2-week follow-up. The patient has 3 small open areas in the lower sacrum and then an area over the left ischial tuberosity. Both of these are stage III wounds. He has been using Hydrofera Blue. Electronic Signature(s) Signed: 10/29/2021 4:13:45 PM By: Linton Ham MD Entered By: Linton Ham on 10/29/2021 15:29:40 [name] ([id number]) Oswaldo Milian, Claudia Pollock ([id number]) -------------------------------------------------------------------------------- Physical Exam Details Patient Name: [name] Date of Service: 10/29/2021 2:30 PM Medical Record Number: [id number] Patient Account Number: [id number] Date of Birth/Sex: 10/27/1965 (55 y.o. Male) Treating RN: Cornell Barman Primary Care Provider: [name] Other Clinician: Referring Provider: Anselm Pancoast Treating Provider/Extender: Ricard Dillon Weeks in Treatment: 2 Constitutional Sitting or  standing Blood Pressure is within target range for patient.. Pulse regular and within target range for patient.Marland Kitchen Respirations regular, non- labored and within target range.. Temperature is normal and within the target range for the patient.Marland Kitchen appears in no distress. Notes Wound exam; the sacrum has 3 closely juxtaposed wounds one of them is deeper and the others are superficial and I debrided with a #3 curette. The area on the left ischial tuberosity is clean and looks to be improving Electronic Signature(s) Signed: 10/29/2021 4:13:45 PM By: Linton Ham MD Entered By: Linton Ham on 10/29/2021 15:32:56 [name] ([id number]) -------------------------------------------------------------------------------- Physician Orders Details Patient Name: [name] Date of Service: 10/29/2021 2:30 PM Medical Record Number: [id number] Patient Account Number: [id number] Date of Birth/Sex: 1965-11-01 (55 y.o. Male) Treating RN: Cornell Barman Primary Care Provider: [name] Other Clinician: Referring Provider: Anselm Pancoast Treating Provider/Extender: Tito Dine in Treatment: 2 Verbal / Phone Orders: No Diagnosis Coding Follow-up Appointments o Return Appointment in 2 weeks. o Nurse Visit as needed [place] for wound care. May utilize formulary equivalent dressing for wound treatment orders unless otherwise specified. Home Health Nurse may visit PRN to address patientos wound care needs. o Scheduled days for dressing changes to be completed; exception, patient has scheduled wound care visit that  day. o **Please direct any NON-WOUND related issues/requests for orders to patient's Primary Care Physician. **If current dressing causes regression in wound condition, may D/C ordered dressing product/s and apply Normal Saline Moist Dressing daily until next [place] or Other MD  appointment. **Notify [facility] of regression in wound condition at [phone]. Bathing/ Shower/ Hygiene o Wash wounds with antibacterial soap and water. o May shower; gently cleanse wound with antibacterial soap, rinse and pat dry prior to dressing wounds o No tub bath. Off-Loading o Turn and reposition every 2 hours o Other: - relive pressure from heels on bed Wound Treatment Wound #7 - Sacrum Wound Laterality: Medial, Distal Cleanser: Soap and Water (Home Health) 3 x Per Week/30 Days Discharge Instructions: Gently cleanse wound with antibacterial soap, rinse and pat dry prior to dressing wounds Primary Dressing: Hydrofera Blue Ready Transfer Foam, 4x5 (in/in) (Home Health) 3 x Per Week/30 Days Discharge Instructions: Apply Hydrofera Blue Ready to wound bed as directed Secondary Dressing: ABD Pad 5x9 (in/in) ([place]) 3 x Per Week/30 Days Discharge Instructions: Cover with ABD pad Secured With: Medipore Tape - 18M Medipore H Soft Cloth Surgical Tape, 2x2 (in/yd) ([place]) 3 x Per Week/30 Days Wound #8 - Gluteus Wound Laterality: Left Cleanser: Soap and Water (Home Health) 3 x Per Week/30 Days Discharge Instructions: Gently cleanse wound with antibacterial soap, rinse and pat dry prior to dressing wounds Primary Dressing: Hydrofera Blue Ready Transfer Foam, 4x5 (in/in) (Home Health) 3 x Per Week/30 Days Discharge Instructions: Apply Hydrofera Blue Ready to wound bed as directed Secondary Dressing: ABD Pad 5x9 (in/in) ([place]) 3 x Per Week/30 Days Discharge Instructions: Cover with ABD pad Secured With: Medipore Tape - 18M Medipore H Soft Cloth Surgical Tape, 2x2 (in/yd) ([place]) 3 x Per Week/30 Days Electronic Signature(s) Signed: 10/29/2021 4:13:45 PM By: Linton Ham MD Signed: 10/30/2021 4:29:23 PM By: Gretta Cool, BSN, RN, CWS, Kim RN, BSN Entered By: Gretta Cool, BSN, RN, CWS, Kim on 10/29/2021 15:14:54 [name] ([id number]) Oswaldo Milian,  Robey Jacinto Reap ([id number]) -------------------------------------------------------------------------------- Problem List Details Patient Name: [name] Date of Service: 10/29/2021 2:30 PM Medical Record Number: [id number] Patient Account Number: [id number] Date of Birth/Sex: 06/08/1965 (55 y.o. Male) Treating RN: Cornell Barman Primary Care Provider: [name] Other Clinician: Referring Provider: Anselm Pancoast Treating Provider/Extender: Ricard Dillon Weeks in Treatment: 2 Active Problems ICD-10 Encounter Code Description Active Date MDM Diagnosis L89.153 Pressure ulcer of sacral region, stage 3 10/15/2021 No Yes L89.893 Pressure ulcer of other site, stage 3 10/15/2021 No Yes G82.54 Quadriplegia, C5-C7 incomplete 10/15/2021 No Yes Z93.1 Gastrostomy status 10/15/2021 No Yes Inactive Problems Resolved Problems Electronic Signature(s) Signed: 10/29/2021 4:13:45 PM By: Linton Ham MD Entered By: Linton Ham on 10/29/2021 15:28:05 [name] ([id number]) -------------------------------------------------------------------------------- Progress Note Details Patient Name: [name] Date of Service: 10/29/2021 2:30 PM Medical Record Number: [id number] Patient Account Number: [id number] Date of Birth/Sex: 04/06/1966 (55 y.o. Male) Treating RN: Cornell Barman Primary Care Provider: [name] Other Clinician: Referring Provider: Anselm Pancoast Treating Provider/Extender: Ricard Dillon Weeks in Treatment: 2 Subjective History of Present Illness (HPI) The following HPI elements were documented for the patient's wound: Associated Signs and Symptoms: Patient has a history of confirmed osteomyelitis of the left foot according to MRI July 2019. 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 12/08/17 on evaluation today patient presents for initial evaluation and our clinic concerning issues he has been  having with his feet bilaterally although the left more than right most recently. He was admitted to hospital where he was placed on IV vancomycin which actually he continue to utilize until discharge and even when discharged continue to be on until around 16 August according to what he tells me. His PICC line was removed at that point. Nonetheless he has been seen in the wound care center in [facility] before transferring to [place] due to the fact that the doctor there left and they are no longer open. Nonetheless he does have significant osteomyelitis of the left foot noted on MRI which revealed significant issues with necrotic bone including a portion of the distal region of his left great toe which was felt to possibly either be missing as a result of amputation or potentially resorption. Either way he also had osteomyelitis noted of the digit, metatarsal region, cuneiform, and navicular bones. There is definite bone exposure noted externally at this point in time. In regard to the right foot he has issues with ulcerations here as well although he states that recently he's had no issues until this just blistered and reopened. Apparently Xeroform was used in this has caused things to be somewhat more moist and open more according to the patient. He's otherwise been tolerating the dressing changes without complication using silver alginate dressings. He has no discomfort but does seem to be extremely stressed regarding the fact that he did see Dr. 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 The patient stated per notes reviewed that he did not want to have any surgery and therefore has a repeat appointment scheduled with the surgeon on 12/15/17. With all that being said he feels like that he really has not been alerted to what was going on in the severity that was until just recently and has a lot of questions in that regard. I'll be see I explained that seeing him  for the first time today I cannot answer a lot of those questions that he has. 12/21/17 on evaluation today patient actually appears to be doing a little better in regard to the right foot in particular. There is one area where there still definitive bone noted although the MRI which I did review today that we had ordered was negative for any evidence of osteomyelitis which is good news. Nonetheless there was some necrotic bone noted. Overall the patient appears to be doing fairly well however in regard to the right foot. Unfortunately he does have a new open area on the posterior aspect of his left leg as well as the continued area of necrosis noted in the central portion of the foot. He states that he's actually going to be sent for reevaluation specifically to a limb salvage clinic at [facility] he believes this is something that is primary care provider has been working with. They were just awaiting the results of the MRI. 01/08/18 on evaluation today patient actually appears to be doing maybe just a little better in regard to the overall appearance of his bilateral foot ulcers. In general this does not seem to have worsened at least which is good news. He still has evidence of bone noted on the surface of both wound areas which though dry appearing really has not otherwise changed at this time. He actually has an appointment at [facility] with the wound center to discuss limb salvage. He discusses with me at the last visit. Nonetheless he was somewhat upset today  about a couple things one being that he stated that we did not put the order in for the protective dressing for his knees that we discussed last time. With that being said I had put in the order for the protective dressing in fact it was on his order sheet that was signed on 12/21/17. Nonetheless unfortunately it appears this was overlooked by home health and they told him if they had the order they could put the protective dressing on.  Nonetheless they told him they did not have the order. Again I believe this was an oversight nothing intentional on anyone's part nonetheless the order was there. Still I had explained to the patient sometimes insurance will not cover for the protective dressing even if it is ordered and this was discussed in the last visit. 01/25/18 on evaluation today patient presents for follow-up concerning his bilateral lower extremity ulcers. He has actually been doing about the same since I last saw him. We do have them in the room we can look at his gluteal region today as well to ensure that there is nothing open or if so address the issue there. Nonetheless he was supposed to see you and see wound care/limb salvage prior to his visit with me today. Unfortunately when he arrived last time at their clinic he apparently was there on the wrong day the doctor was not even present that he was his to be seen. Nonetheless I had to be switched to a different day and the patient actually is going in the next week to see them. Assuming everything works out this should actually be tomorrow. Nonetheless if they take over care of that point then we will subsequently release care to them. Readmission: 10-15-2021 upon evaluation today patient appears to be doing somewhat poorly in regard to wounds over the left gluteal region. He also has areas in the midline sacrum although this is not quite as bad. There is however some evidence of necrotic bone noted at this point unfortunately. The question is whether this is just something working its way out or if it something that is actually actively infected at this point. We are going to have to evaluate that further little by little as we go with additional diagnostics. With that being said the patient tells me currently that what we are seeing him for has been open since around 09-05-2021 for the gluteal area on the left and in regard to the sacrum 10-01-2021 he states. With that  being said he again is not too significant as far as the overall appearance of the wounds are concerned but again with the bone exposed this is something that we may want to do at pathology and culture in regard to. 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 This is a gentleman whom I have previously seen back in 2019. The wound that we were taking care of back at that time is completely cleared. 7/25; 2-week follow-up. The patient has 3 small open areas in the lower sacrum and then an area over the left ischial tuberosity. Both of these are stage III wounds. He has been using Hydrofera Blue. Waller, Ege B. ([id number]) Objective Constitutional Sitting or standing Blood Pressure is within target range for patient.. Pulse regular and within target range for patient.Marland Kitchen Respirations regular, non- labored and within target range.. Temperature is normal and within the target range for the  patient.Marland Kitchen appears in no distress. Vitals Time Taken: 2:28 PM, Height: 73 in, Weight: 154 lbs, BMI: 20.3, Temperature: 99.6 F, Pulse: 106 bpm, Respiratory Rate: 18 breaths/min, Blood Pressure: 107/72 mmHg. General Notes: Wound exam; the sacrum has 3 closely juxtaposed wounds one of them is deeper and the others are superficial and I debrided with a #3 curette. The area on the left ischial tuberosity is clean and looks to be improving Integumentary (Hair, Skin) Wound #7 status is Open. Original cause of wound was Pressure Injury. The date acquired was: 10/01/2021. The wound has been in treatment 2 weeks. The wound is located on the [place]. The wound measures 3cm length x 4.5cm width x 1cm depth; 10.603cm^2 area and 10.603cm^3 volume. There is Fat Layer (Subcutaneous Tissue) exposed. There is no tunneling or undermining noted. There is a medium amount of serosanguineous drainage  noted. There is large (67-100%) red, pink granulation within the wound bed. There is a small (1-33%) amount of necrotic tissue within the wound bed including Adherent Slough. Wound #8 status is Open. Original cause of wound was Pressure Injury. The date acquired was: 09/05/2021. The wound has been in treatment 2 weeks. The wound is located on the Left Gluteus. The wound measures 3cm length x 1.5cm width x 1cm depth; 3.534cm^2 area and 3.534cm^3 volume. There is Fat Layer (Subcutaneous Tissue) exposed. There is a medium amount of serosanguineous drainage noted. There is large (67- 100%) red granulation within the wound bed. There is a small (1-33%) amount of necrotic tissue within the wound bed including Adherent Slough. Assessment Active Problems ICD-10 Pressure ulcer of sacral region, stage 3 Pressure ulcer of other site, stage 3 Quadriplegia, C5-C7 incomplete Gastrostomy status Procedures Wound #7 Pre-procedure diagnosis of Wound #7 is a Pressure Ulcer located on the Distal,Medial Sacrum . There was a Excisional Skin/Subcutaneous Tissue Debridement with a total area of 13.5 sq cm performed by Ricard Dillon, MD. With the following instrument(s): Curette to remove Viable and Non-Viable tissue/material. Material removed includes Subcutaneous Tissue and Slough and. No specimens were taken. A time out was conducted at 15:00, prior to the start of the procedure. A Moderate amount of bleeding was controlled with Pressure. The procedure was tolerated well. Post Debridement Measurements: 3cm length x 4.5cm width x 0.3cm depth; 3.181cm^3 volume. Post debridement Stage noted as Category/Stage III. Character of Wound/Ulcer Post Debridement is stable. Post procedure Diagnosis Wound #7: Same as Pre-Procedure Plan Follow-up Appointments: Return Appointment in 2 weeks. Nurse Visit as needed Home Health: [facility]: - [place] for wound care. May utilize formulary  equivalent dressing for wound treatment orders unless otherwise specified. Home Health Nurse may visit [facility] to address patient s wound care needs. Lemonds, Treyshawn B. ([id number]) Scheduled days for dressing changes to be completed; exception, patient has scheduled wound care visit that day. **Please direct any NON-WOUND related issues/requests for orders to patient's Primary Care Physician. **If current dressing causes regression in wound condition, may D/C ordered dressing product/s and apply Normal Saline Moist Dressing daily until next [place] or Other MD appointment. **Notify [facility] of regression in wound condition at [phone]. Bathing/ Shower/ Hygiene: Wash wounds with antibacterial soap and water. May shower; gently cleanse wound with antibacterial soap, rinse and pat dry prior to dressing wounds No tub bath. Off-Loading: Turn and reposition every 2 hours Other: - relive pressure from heels on bed WOUND #7: - Sacrum Wound Laterality: Medial, Distal Cleanser: Soap and Water (Home Health)  3 x Per Week/30 Days Discharge Instructions: Gently cleanse wound with antibacterial soap, rinse and pat dry prior to dressing wounds Primary Dressing: Hydrofera Blue Ready Transfer Foam, 4x5 (in/in) (Home Health) 3 x Per Week/30 Days Discharge Instructions: Apply Hydrofera Blue Ready to wound bed as directed Secondary Dressing: ABD Pad 5x9 (in/in) (Home Health) 3 x Per Week/30 Days Discharge Instructions: Cover with ABD pad Secured With: Medipore Tape - 15M Medipore H Soft Cloth Surgical Tape, 2x2 (in/yd) ([place]) 3 x Per Week/30 Days WOUND #8: - Gluteus Wound Laterality: Left Cleanser: Soap and Water (Home Health) 3 x Per Week/30 Days Discharge Instructions: Gently cleanse wound with antibacterial soap, rinse and pat dry prior to dressing wounds Primary Dressing: Hydrofera Blue Ready Transfer Foam, 4x5 (in/in) (Home Health) 3 x Per Week/30 Days Discharge  Instructions: Apply Hydrofera Blue Ready to wound bed as directed Secondary Dressing: ABD Pad 5x9 (in/in) (Home Health) 3 x Per Week/30 Days Discharge Instructions: Cover with ABD pad Secured With: Medipore Tape - 15M Medipore H Soft Cloth Surgical Tape, 2x2 (in/yd) (Home Health) 3 x Per Week/30 Days 1. We continued with the [facility]. It appears like the patient is doing well he seems motivated to try and stay off this is much as he can. 2. He has a Roho cushion but that looks as though it will need further inflation probably too soft I talked to him about this Electronic Signature(s) Signed: 10/29/2021 4:13:45 PM By: Linton Ham MD Entered By: Linton Ham on 10/29/2021 15:33:44 [name] ([id number]) -------------------------------------------------------------------------------- [place] Details Patient Name: [name] Date of Service: 10/29/2021 Medical Record Number: [id number] Patient Account Number: [id number] Date of Birth/Sex: 12-Dec-1965 (55 y.o. 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, C5-C7 incomplete Z93.1 Gastrostomy status Facility Procedures CPT4 Code: 35573220 Description: 25427 - DEB SUBQ TISSUE 20 SQ CM/< Modifier: Quantity: 1 CPT4 Code: Description: ICD-10 Diagnosis Description L89.153 Pressure ulcer of sacral region, stage 3 Modifier: Quantity: Physician Procedures CPT4 Code: 0623762 Description: 11042 - WC PHYS SUBQ TISS 20 SQ CM Modifier: Quantity: 1 CPT4 Code: Description: ICD-10 Diagnosis Description L89.153 Pressure ulcer of sacral region, stage 3 Modifier: Quantity: Electronic Signature(s) Signed: 10/29/2021  4:13:45 PM By: Linton Ham MD Entered By: Linton Ham on 10/29/2021 15:34:05

## 2021-11-12 ENCOUNTER — Encounter: Payer: Medicare Other | Attending: Physician Assistant | Admitting: Physician Assistant

## 2021-11-12 DIAGNOSIS — Z931 Gastrostomy status: Secondary | ICD-10-CM | POA: Insufficient documentation

## 2021-11-12 DIAGNOSIS — G8254 Quadriplegia, C5-C7 incomplete: Secondary | ICD-10-CM | POA: Diagnosis not present

## 2021-11-12 DIAGNOSIS — L89153 Pressure ulcer of sacral region, stage 3: Secondary | ICD-10-CM | POA: Diagnosis not present

## 2021-11-12 DIAGNOSIS — L89893 Pressure ulcer of other site, stage 3: Secondary | ICD-10-CM | POA: Insufficient documentation

## 2021-11-12 DIAGNOSIS — M069 Rheumatoid arthritis, unspecified: Secondary | ICD-10-CM | POA: Diagnosis not present

## 2021-11-12 NOTE — Progress Notes (Addendum)
[name], [name] ([id number]) Visit Report for 11/12/2021 Chief Complaint Document Details Patient Name: [name] Date of Service: 11/12/2021 10:45 AM Medical Record Number: [id number] Patient Account Number: [id number] Date of Birth/Sex: [birth year] (56 y.o. Male) Treating RN: Levora Dredge Primary Care Provider: [name] Other Clinician: Referring Provider: Anselm Pancoast Treating Provider/Extender: Skipper Cliche in Treatment: 4 Information Obtained from: Patient Chief Complaint Sacral and back pressure ulcers Electronic Signature(s) Signed: 11/12/2021 11:18:44 AM By: Worthy Keeler PA-C Entered By: Worthy Keeler on 11/12/2021 11:18:44 [name] ([id number]) -------------------------------------------------------------------------------- Debridement Details Patient Name: [name] Date of Service: 11/12/2021 10:45 AM Medical Record Number: [id number] Patient Account Number: [id number] Date of Birth/Sex: [birth year] (56 y.o. Male) Treating RN: Levora Dredge Primary Care Provider: [name] Other Clinician: Referring Provider: Anselm Pancoast Treating Provider/Extender: Skipper Cliche in Treatment: 4 Debridement Performed for Wound #8 Left Gluteus Assessment: Performed By: Physician Tommie Sams., PA-C Debridement Type: Debridement Level of Consciousness (Pre- Awake and Alert procedure): Pre-procedure Verification/Time Out Yes - 11:39 Taken: Total Area Debrided (L x W): 2.8 (cm) x 2 (cm) = 5.6 (cm) Tissue and other material Non-Viable, Slough, Subcutaneous, Slough debrided: Level: Skin/Subcutaneous Tissue Debridement Description: Excisional Instrument: Curette Bleeding: Minimum Hemostasis Achieved: Pressure Response to Treatment: Procedure was tolerated well Level of Consciousness (Post- Awake and Alert procedure): Post Debridement Measurements of Total Wound Length: (cm) 2.8 Stage: Category/Stage III Width: (cm)  2 Depth: (cm) 0.6 Volume: (cm) 2.639 Character of Wound/Ulcer Post Debridement: Stable Post Procedure Diagnosis Same as Pre-procedure Electronic Signature(s) Signed: 11/12/2021 3:58:52 PM By: Worthy Keeler PA-C Signed: 11/12/2021 4:00:15 PM By: Levora Dredge Entered By: Levora Dredge on 11/12/2021 11:41:02 [name] ([id number]) -------------------------------------------------------------------------------- HPI Details Patient Name: [name] Date of Service: 11/12/2021 10:45 AM Medical Record Number: [id number] Patient Account Number: [id number] Date of Birth/Sex: [birth year] (56 y.o. Male) Treating RN: Levora Dredge Primary Care Provider: [name] Other Clinician: Referring Provider: Anselm Pancoast Treating Provider/Extender: Skipper Cliche in Treatment: 4 History of Present Illness Associated Signs and Symptoms: Patient has a history of confirmed osteomyelitis of the left foot according to MRI July 2019. 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 HPI Description: 12/08/17 on evaluation today patient presents for initial evaluation and our clinic concerning issues he has been having with his feet bilaterally although the left more than right most recently. He was admitted to hospital where he was placed on IV vancomycin which actually he continue to utilize until discharge and even when discharged continue to be on until around 16 August according to what he tells me. His PICC line was removed at that point. Nonetheless he has been seen in the wound care center in [facility] before transferring to [place] due to the fact that the doctor there left and they are no longer open. Nonetheless he does have significant osteomyelitis of the left foot noted on MRI which revealed significant issues with necrotic bone including a portion of the distal region of his left great toe which was felt to  possibly either be missing as a result of amputation or potentially resorption. Either way he also had osteomyelitis noted of the digit, metatarsal region, cuneiform, and navicular bones. There is definite bone exposure noted externally at this point in time. In regard to the right foot he has issues with ulcerations here as well although he states that recently he's had no issues until this  just blistered and reopened. Apparently Xeroform was used in this has caused things to be somewhat more moist and open more according to the patient. He's otherwise been tolerating the dressing changes without complication using silver alginate dressings. He has no discomfort but does seem to be extremely stressed regarding the fact that he did see Dr. 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 The patient stated per notes reviewed that he did not want to have any surgery and therefore has a repeat appointment scheduled with the surgeon on 12/15/17. With all that being said he feels like that he really has not been alerted to what was going on in the severity that was until just recently and has a lot of questions in that regard. I'll be see I explained that seeing him for the first time today I cannot answer a lot of those questions that he has. 12/21/17 on evaluation today patient actually appears to be doing a little better in regard to the right foot in particular. There is one area where there still definitive bone noted although the MRI which I did review today that we had ordered was negative for any evidence of osteomyelitis which is good news. Nonetheless there was some necrotic bone noted. Overall the patient appears to be doing fairly well however in regard to the right foot. Unfortunately he does have a new open area on the posterior aspect of his left leg as well as the continued area of necrosis noted in the central portion of the foot. He states that he's actually going  to be sent for reevaluation specifically to a limb salvage clinic at [facility] he believes this is something that is primary care provider has been working with. They were just awaiting the results of the MRI. 01/08/18 on evaluation today patient actually appears to be doing maybe just a little better in regard to the overall appearance of his bilateral foot ulcers. In general this does not seem to have worsened at least which is good news. He still has evidence of bone noted on the surface of both wound areas which though dry appearing really has not otherwise changed at this time. He actually has an appointment at [facility] with the wound center to discuss limb salvage. He discusses with me at the last visit. Nonetheless he was somewhat upset today about a couple things one being that he stated that we did not put the order in for the protective dressing for his knees that we discussed last time. With that being said I had put in the order for the protective dressing in fact it was on his order sheet that was signed on 12/21/17. Nonetheless unfortunately it appears this was overlooked by home health and they told him if they had the order they could put the protective dressing on. Nonetheless they told him they did not have the order. Again I believe this was an oversight nothing intentional on anyone's part nonetheless the order was there. Still I had explained to the patient sometimes insurance will not cover for the protective dressing even if it is ordered and this was discussed in the last visit. 01/25/18 on evaluation today patient presents for follow-up concerning his bilateral lower extremity ulcers. He has actually been doing about the same since I last saw him. We do have them in the room we can look at his gluteal region today as well to ensure that there is nothing open or if so address  the issue there. Nonetheless he was supposed to see you and see wound care/limb salvage prior to his visit  with me today. Unfortunately when he arrived last time at their clinic he apparently was there on the wrong day the doctor was not even present that he was his to be seen. Nonetheless I had to be switched to a different day and the patient actually is going in the next week to see them. Assuming everything works out this should actually be tomorrow. Nonetheless if they take over care of that point then we will subsequently release care to them. Readmission: 10-15-2021 upon evaluation today patient appears to be doing somewhat poorly in regard to wounds over the left gluteal region. He also has areas in the midline sacrum although this is not quite as bad. There is however some evidence of necrotic bone noted at this point unfortunately. The question is whether this is just something working its way out or if it something that is actually actively infected at this point. We are going to have to evaluate that further little by little as we go with additional diagnostics. With that being said the patient tells me currently that what we are seeing him for has been open since around 09-05-2021 for the gluteal area on the left and in regard to the sacrum 10-01-2021 he states. With that being said he again is not too significant as far as the overall appearance of the wounds are concerned but again with the bone exposed this is something that we may want to do at pathology and culture in regard to. 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 This is a gentleman whom I have previously seen back in 2019. The wound that we were taking care of back at that time is completely cleared. 7/25; 2-week follow-up. The patient has 3 small open areas in the lower sacrum and then an area over the left ischial tuberosity. Both of these are stage III wounds. He has been using Hydrofera Blue. 11-12-2021 upon  evaluation today patient appears to be doing well currently in regard to his wounds. The distal sacral area is almost completely closed and looks to be doing great. In regard to the left gluteal region this is showing signs of improvement from a depth perspective size is about the same. Overall I am extremely pleased with where things stand compared to what we were previous. Electronic Signature(s) [name], [name] ([id number]) Signed: 11/12/2021 1:18:21 PM By: Worthy Keeler PA-C Entered By: Worthy Keeler on 11/12/2021 13:18:20 [name] ([id number]) -------------------------------------------------------------------------------- Physical Exam Details Patient Name: [name] Date of Service: 11/12/2021 10:45 AM Medical Record Number: [id number] Patient Account Number: [id number] Date of Birth/Sex: Aug 09, 1965 (55 y.o. Male) Treating RN: Levora Dredge Primary Care Provider: [name] Other Clinician: Referring Provider: Anselm Pancoast Treating Provider/Extender: Skipper Cliche in Treatment: 4 Constitutional Well-nourished and well-hydrated in no acute distress. Respiratory normal breathing without difficulty. Psychiatric this patient is able to make decisions and demonstrates good insight into disease process. Alert and Oriented x 3. pleasant and cooperative. Notes Upon inspection patient's wound bed actually showed signs of good granulation and epithelization at this point. Fortunately I do not see any evidence of active infection locally or systemically which is great news and overall I am extremely pleased with where things stand currently. I did perform debridement in regard to the  wound on the left gluteal region to remove slough and subcutaneous tissue down to a good level and he tolerated this without any pain or complication he has no feeling in this area. Electronic Signature(s) Signed: 11/12/2021 1:18:52 PM By: Worthy Keeler PA-C Entered By:  Worthy Keeler on 11/12/2021 13:18:52 [name] ([id number]) -------------------------------------------------------------------------------- Physician Orders Details Patient Name: [name] Date of Service: 11/12/2021 10:45 AM Medical Record Number: [id number] Patient Account Number: [id number] Date of Birth/Sex: 1965-05-17 (55 y.o. Male) Treating RN: Levora Dredge Primary Care Provider: [name] Other Clinician: Referring Provider: Anselm Pancoast Treating Provider/Extender: Skipper Cliche in Treatment: 4 Verbal / Phone Orders: No Diagnosis Coding ICD-10 Coding Code Description L89.153 Pressure ulcer of sacral region, stage 3 L89.893 Pressure ulcer of other site, stage 3 G82.54 Quadriplegia, C5-C7 incomplete Z93.1 Gastrostomy status Follow-up Appointments o Return Appointment in 2 weeks. o Nurse Visit as needed [place] for wound care. May utilize formulary equivalent dressing for wound treatment orders unless otherwise specified. Home Health Nurse may visit [facility] to address patientos wound care needs. o Scheduled days for dressing changes to be completed; exception, patient has scheduled wound care visit that day. o **Please direct any NON-WOUND related issues/requests for orders to patient's Primary Care Physician. **If current dressing causes regression in wound condition, may D/C ordered dressing product/s and apply Normal Saline Moist Dressing daily until next [place] or Other MD appointment. **Notify [facility]aling Center of regression in wound condition at [phone]. Bathing/ Shower/ Hygiene o Wash wounds with antibacterial soap and water. o May shower; gently cleanse wound with antibacterial soap, rinse and pat dry prior to dressing wounds o No tub bath. Off-Loading o Turn and reposition every 2 hours o Other: - relive pressure from heels on  bed Wound Treatment Wound #7 - Sacrum Wound Laterality: Medial, Distal Cleanser: Soap and Water (Home Health) 3 x Per Week/30 Days Discharge Instructions: Gently cleanse wound with antibacterial soap, rinse and pat dry prior to dressing wounds Primary Dressing: Hydrofera Blue Ready Transfer Foam, 4x5 (in/in) (Home Health) 3 x Per Week/30 Days Discharge Instructions: Apply Hydrofera Blue Ready to wound bed as directed Secondary Dressing: ABD Pad 5x9 (in/in) ([place]) 3 x Per Week/30 Days Discharge Instructions: Cover with ABD pad Secured With: Medipore Tape - 69M Medipore H Soft Cloth Surgical Tape, 2x2 (in/yd) ([place]) 3 x Per Week/30 Days Wound #8 - Gluteus Wound Laterality: Left Cleanser: Soap and Water (Home Health) 3 x Per Week/30 Days Discharge Instructions: Gently cleanse wound with antibacterial soap, rinse and pat dry prior to dressing wounds Primary Dressing: Hydrofera Blue Ready Transfer Foam, 4x5 (in/in) (Home Health) 3 x Per Week/30 Days Discharge Instructions: Apply Hydrofera Blue Ready to wound bed as directed Secondary Dressing: ABD Pad 5x9 (in/in) ([place]) 3 x Per Week/30 Days Discharge Instructions: Cover with ABD pad Secured With: Medipore Tape - 69M Medipore H Soft Cloth Surgical Tape, 2x2 (in/yd) ([place]) 3 x Per Week/30 Days [name], [name] ([id number]) Electronic Signature(s) Signed: 11/12/2021 3:58:52 PM By: Worthy Keeler PA-C Signed: 11/12/2021 4:00:15 PM By: Levora Dredge Entered By: Levora Dredge on 11/12/2021 11:39:03 [name] ([id number]) -------------------------------------------------------------------------------- Problem List Details Patient Name: [name] Date of Service: 11/12/2021 10:45 AM Medical Record Number: [id number] Patient Account Number: [id number] Date of Birth/Sex: May 11, 1965 (55 y.o. Male) Treating RN: Levora Dredge Primary Care Provider: [name] Other Clinician: Referring Provider:  Sluss, Vida Roller Treating Provider/Extender: Jeri Cos Weeks in Treatment: 4 Active Problems ICD-10 Encounter Code Description Active Date MDM Diagnosis L89.153 Pressure ulcer of sacral region, stage 3 10/15/2021 No Yes L89.893 Pressure ulcer of other site, stage 3 10/15/2021 No Yes G82.54 Quadriplegia, C5-C7 incomplete 10/15/2021 No Yes Z93.1 Gastrostomy status 10/15/2021 No Yes Inactive Problems Resolved Problems Electronic Signature(s) Signed: 11/12/2021 12:05:47 PM By: Levora Dredge Signed: 11/12/2021 3:58:52 PM By: Worthy Keeler PA-C Previous Signature: 11/12/2021 11:18:40 AM Version By: Worthy Keeler PA-C Entered By: Levora Dredge on 11/12/2021 12:05:47 [name] ([id number]) -------------------------------------------------------------------------------- Progress Note Details Patient Name: [name] Date of Service: 11/12/2021 10:45 AM Medical Record Number: [id number] Patient Account Number: [id number] Date of Birth/Sex: Feb 11, 1966 (55 y.o. Male) Treating RN: Levora Dredge Primary Care Provider: [name] Other Clinician: Referring Provider: Anselm Pancoast Treating Provider/Extender: Skipper Cliche in Treatment: 4 Subjective Chief Complaint Information obtained from Patient Sacral and back pressure ulcers History of Present Illness (HPI) The following HPI elements were documented for the patient's wound: Associated Signs and Symptoms: Patient has a history of confirmed osteomyelitis of the left foot according to MRI July 2019. 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 12/08/17 on evaluation today patient presents for initial evaluation and our clinic concerning issues he has been having with his feet bilaterally although the left more than right most recently. He was admitted to hospital where he was placed on IV vancomycin which actually he continue to utilize until discharge and  even when discharged continue to be on until around 16 August according to what he tells me. His PICC line was removed at that point. Nonetheless he has been seen in the wound care center in [facility] before transferring to [place] due to the fact that the doctor there left and they are no longer open. Nonetheless he does have significant osteomyelitis of the left foot noted on MRI which revealed significant issues with necrotic bone including a portion of the distal region of his left great toe which was felt to possibly either be missing as a result of amputation or potentially resorption. Either way he also had osteomyelitis noted of the digit, metatarsal region, cuneiform, and navicular bones. There is definite bone exposure noted externally at this point in time. In regard to the right foot he has issues with ulcerations here as well although he states that recently he's had no issues until this just blistered and reopened. Apparently Xeroform was used in this has caused things to be somewhat more moist and open more according to the patient. He's otherwise been tolerating the dressing changes without complication using silver alginate dressings. He has no discomfort but does seem to be extremely stressed regarding the fact that he did see Dr. 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 The patient stated per notes reviewed that he did not want to have any surgery and therefore has a repeat appointment scheduled with the surgeon on 12/15/17. With all that being said he feels like that he really has not been alerted to what was going on in the severity that was until just recently and has a lot of questions in that regard. I'll be see I explained that seeing him for the first time today I cannot answer a lot of those questions that he has. 12/21/17 on evaluation today patient actually appears to be doing a little better in regard to  the right foot in particular.  There is one area where there still definitive bone noted although the MRI which I did review today that we had ordered was negative for any evidence of osteomyelitis which is good news. Nonetheless there was some necrotic bone noted. Overall the patient appears to be doing fairly well however in regard to the right foot. Unfortunately he does have a new open area on the posterior aspect of his left leg as well as the continued area of necrosis noted in the central portion of the foot. He states that he's actually going to be sent for reevaluation specifically to a limb salvage clinic at [facility] he believes this is something that is primary care provider has been working with. They were just awaiting the results of the MRI. 01/08/18 on evaluation today patient actually appears to be doing maybe just a little better in regard to the overall appearance of his bilateral foot ulcers. In general this does not seem to have worsened at least which is good news. He still has evidence of bone noted on the surface of both wound areas which though dry appearing really has not otherwise changed at this time. He actually has an appointment at [facility] with the wound center to discuss limb salvage. He discusses with me at the last visit. Nonetheless he was somewhat upset today about a couple things one being that he stated that we did not put the order in for the protective dressing for his knees that we discussed last time. With that being said I had put in the order for the protective dressing in fact it was on his order sheet that was signed on 12/21/17. Nonetheless unfortunately it appears this was overlooked by home health and they told him if they had the order they could put the protective dressing on. Nonetheless they told him they did not have the order. Again I believe this was an oversight nothing intentional on anyone's part nonetheless the order was there. Still I had explained to the patient sometimes  insurance will not cover for the protective dressing even if it is ordered and this was discussed in the last visit. 01/25/18 on evaluation today patient presents for follow-up concerning his bilateral lower extremity ulcers. He has actually been doing about the same since I last saw him. We do have them in the room we can look at his gluteal region today as well to ensure that there is nothing open or if so address the issue there. Nonetheless he was supposed to see you and see wound care/limb salvage prior to his visit with me today. Unfortunately when he arrived last time at their clinic he apparently was there on the wrong day the doctor was not even present that he was his to be seen. Nonetheless I had to be switched to a different day and the patient actually is going in the next week to see them. Assuming everything works out this should actually be tomorrow. Nonetheless if they take over care of that point then we will subsequently release care to them. Readmission: 10-15-2021 upon evaluation today patient appears to be doing somewhat poorly in regard to wounds over the left gluteal region. He also has areas in the midline sacrum although this is not quite as bad. There is however some evidence of necrotic bone noted at this point unfortunately. The question is whether this is just something working its way out or if it something that is actually actively infected at  this point. We are going to have to evaluate that further little by little as we go with additional diagnostics. With that being said the patient tells me currently that what we are seeing him for has been open since around 09-05-2021 for the gluteal area on the left and in regard to the sacrum 10-01-2021 he states. With that being said he again is not too significant as far as the overall appearance of the wounds are concerned but again with the bone exposed this is something that we may want to do at pathology and culture in regard  to. 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 This is a gentleman whom I have previously seen back in 2019. The wound that we were taking care of back at that time is completely cleared. 7/25; 2-week follow-up. The patient has 3 small open areas in the lower sacrum and then an area over the left ischial tuberosity. Both of these are stage III wounds. He has been using Hydrofera Blue. [name], Colin B. ([id number]) 11-12-2021 upon evaluation today patient appears to be doing well currently in regard to his wounds. The distal sacral area is almost completely closed and looks to be doing great. In regard to the left gluteal region this is showing signs of improvement from a depth perspective size is about the same. Overall I am extremely pleased with where things stand compared to what we were previous. Objective Constitutional Well-nourished and well-hydrated in no acute distress. Vitals Time Taken: 11:11 AM, Height: 73 in, Weight: 154 lbs, BMI: 20.3, Temperature: 97.8 F, Pulse: 81 bpm, Respiratory Rate: 18 breaths/min, Blood Pressure: 113/78 mmHg. Respiratory normal breathing without difficulty. Psychiatric this patient is able to make decisions and demonstrates good insight into disease process. Alert and Oriented x 3. pleasant and cooperative. General Notes: Upon inspection patient's wound bed actually showed signs of good granulation and epithelization at this point. Fortunately I do not see any evidence of active infection locally or systemically which is great news and overall I am extremely pleased with where things stand currently. I did perform debridement in regard to the wound on the left gluteal region to remove slough and subcutaneous tissue down to a good level and he tolerated this without any pain or complication he has no feeling in this  area. Integumentary (Hair, Skin) Wound #7 status is Open. Original cause of wound was Pressure Injury. The date acquired was: 10/01/2021. The wound has been in treatment 4 weeks. The wound is located on the [place]. The wound measures 1.8cm length x 4cm width x 0.1cm depth; 5.655cm^2 area and 0.565cm^3 volume. There is Fat Layer (Subcutaneous Tissue) exposed. There is no tunneling or undermining noted. There is a medium amount of serosanguineous drainage noted. There is large (67-100%) red, pink granulation within the wound bed. There is a small (1-33%) amount of necrotic tissue within the wound bed including Adherent Slough. Wound #8 status is Open. Original cause of wound was Pressure Injury. The date acquired was: 09/05/2021. The wound has been in treatment 4 weeks. The wound is located on the Left Gluteus. The wound measures 2.8cm length x 2cm width x 0.6cm depth; 4.398cm^2 area and 2.639cm^3 volume. There is Fat Layer (Subcutaneous Tissue) exposed. There is no tunneling or undermining noted. There is a medium amount of serosanguineous drainage noted. There is large (67-100%) red granulation within the wound bed. There is  a small (1-33%) amount of necrotic tissue within the wound bed including Adherent Slough. Assessment Active Problems ICD-10 Pressure ulcer of sacral region, stage 3 Pressure ulcer of other site, stage 3 Quadriplegia, C5-C7 incomplete Gastrostomy status Procedures Wound #8 Pre-procedure diagnosis of Wound #8 is a Pressure Ulcer located on the Left Gluteus . There was a Excisional Skin/Subcutaneous Tissue Debridement with a total area of 5.6 sq cm performed by Tommie Sams., PA-C. With the following instrument(s): Curette to remove Non-Viable tissue/material. Material removed includes Subcutaneous Tissue and Slough and. No specimens were taken. A time out was conducted at 11:39, prior to the start of the procedure. A Minimum amount of bleeding was controlled  with Pressure. The procedure was tolerated well. Post Debridement Measurements: 2.8cm length x 2cm width x 0.6cm depth; 2.639cm^3 volume. Post debridement Stage noted as Category/Stage III. Character of Wound/Ulcer Post Debridement is stable. Post procedure Diagnosis Wound #8: Same as Pre-Procedure [name], Colin B. ([id number]) Plan Follow-up Appointments: Return Appointment in 2 weeks. Nurse Visit as needed Home Health: [place]: - [place] for wound care. May utilize formulary equivalent dressing for wound treatment orders unless otherwise specified. Home Health Nurse may visit PRN to address patient s wound care needs. Scheduled days for dressing changes to be completed; exception, patient has scheduled wound care visit that day. **Please direct any NON-WOUND related issues/requests for orders to patient's Primary Care Physician. **If current dressing causes regression in wound condition, may D/C ordered dressing product/s and apply Normal Saline Moist Dressing daily until next [place] or Other MD appointment. **Notify [facility] of regression in wound condition at [phone]. Bathing/ Shower/ Hygiene: Wash wounds with antibacterial soap and water. May shower; gently cleanse wound with antibacterial soap, rinse and pat dry prior to dressing wounds No tub bath. Off-Loading: Turn and reposition every 2 hours Other: - relive pressure from heels on bed WOUND #7: - Sacrum Wound Laterality: Medial, Distal Cleanser: Soap and Water (Home Health) 3 x Per Week/30 Days Discharge Instructions: Gently cleanse wound with antibacterial soap, rinse and pat dry prior to dressing wounds Primary Dressing: Hydrofera Blue Ready Transfer Foam, 4x5 (in/in) (Home Health) 3 x Per Week/30 Days Discharge Instructions: Apply Hydrofera Blue Ready to wound bed as directed Secondary Dressing: ABD Pad 5x9 (in/in) (Home Health) 3 x Per Week/30  Days Discharge Instructions: Cover with ABD pad Secured With: Medipore Tape - 79M Medipore H Soft Cloth Surgical Tape, 2x2 (in/yd) (Home Health) 3 x Per Week/30 Days WOUND #8: - Gluteus Wound Laterality: Left Cleanser: Soap and Water (Home Health) 3 x Per Week/30 Days Discharge Instructions: Gently cleanse wound with antibacterial soap, rinse and pat dry prior to dressing wounds Primary Dressing: Hydrofera Blue Ready Transfer Foam, 4x5 (in/in) (Home Health) 3 x Per Week/30 Days Discharge Instructions: Apply Hydrofera Blue Ready to wound bed as directed Secondary Dressing: ABD Pad 5x9 (in/in) (Home Health) 3 x Per Week/30 Days Discharge Instructions: Cover with ABD pad Secured With: Medipore Tape - 79M Medipore H Soft Cloth Surgical Tape, 2x2 (in/yd) (Home Health) 3 x Per Week/30 Days 1. I would recommend currently that we going to continue with the wound care measures as before and the patient is in agreement with plan this includes the use of the [facility] which I do believe is doing decently well here. 2. I am also can recommend that we have the patient continue with the ABD pads to cover followed by tape to secure in place.  3. I am also can recommend appropriate offloading he is aware that he needs to be doing this is much as possible to more they can do the better. We will see patient back for reevaluation in 2 weeks here in the clinic. If anything worsens or changes patient will contact our office for additional recommendations. Electronic Signature(s) Signed: 11/12/2021 1:19:36 PM By: Worthy Keeler PA-C Entered By: Worthy Keeler on 11/12/2021 13:19:35 [name] ([id number]) -------------------------------------------------------------------------------- SuperBill Details Patient Name: [name] Date of Service: 11/12/2021 Medical Record Number: [id number] Patient Account Number: [id number] Date of Birth/Sex: [birth year] (55 y.o. Male) Treating RN: Levora Dredge Primary Care Provider: [name] Other Clinician: Referring Provider: Anselm Pancoast Treating Provider/Extender: Skipper Cliche in Treatment: 4 Diagnosis Coding ICD-10 Codes Code Description [phone] Pressure ulcer of sacral region, stage 3 L89.893 Pressure ulcer of other site, stage 3 G82.54 Quadriplegia, C5-C7 incomplete Z93.1 Gastrostomy status Facility Procedures CPT4 Code: 57493552 Description: 17471 - DEB SUBQ TISSUE 20 SQ CM/< Modifier: Quantity: 1 CPT4 Code: Description: ICD-10 Diagnosis Description L89.893 Pressure ulcer of other site, stage 3 Modifier: Quantity: Physician Procedures CPT4 Code: 5953967 Description: 28979 - WC PHYS SUBQ TISS 20 SQ CM Modifier: Quantity: 1 CPT4 Code: Description: ICD-10 Diagnosis Description L89.893 Pressure ulcer of other site, stage 3 Modifier: Quantity: Electronic Signature(s) Signed: 11/12/2021 1:19:49 PM By: Worthy Keeler PA-C Entered By: Worthy Keeler on 11/12/2021 13:19:49

## 2021-11-12 NOTE — Progress Notes (Signed)
[name] ([id number]) Visit Report for 11/12/2021 Arrival Information Details Patient Name: [name], [name] Date of Service: 11/12/2021 10:45 AM Medical Record Number: [id number] Patient Account Number: [id number] Date of Birth/Sex: [birth year] (55 y.o. Male) Treating RN: Levora Dredge Primary Care Artavia Jeanlouis: [name] Other Clinician: Referring Avarey Yaeger: Anselm Pancoast Treating Xee Hollman/Extender: Skipper Cliche in Treatment: 4 Visit Information History Since Last Visit Added or deleted any medications: No Patient Arrived: Wheel Chair Any new allergies or adverse reactions: No Arrival Time: 11:12 Had a fall or experienced change in No Accompanied By: self activities of daily living that may affect Transfer Assistance: Harrel Lemon Lift risk of falls: Patient Identification Verified: Yes Hospitalized since last visit: No Secondary Verification Process Completed: Yes Has Dressing in Place as Prescribed: Yes Pain Present Now: No Electronic Signature(s) Signed: 11/12/2021 4:00:15 PM By: Levora Dredge Entered By: Levora Dredge on 11/12/2021 11:28:30 [name] ([id number]) -------------------------------------------------------------------------------- Clinic Level of Care Assessment Details Patient Name: [name] Date of Service: 11/12/2021 10:45 AM Medical Record Number: [id number] Patient Account Number: [id number] Date of Birth/Sex: [birth year] (55 y.o. Male) Treating RN: Levora Dredge Primary Care Rhodesia Stanger: [name] Other Clinician: Referring Falynn Ailey: Anselm Pancoast Treating Sakib Noguez/Extender: Skipper Cliche in Treatment: 4 Clinic Level of Care Assessment Items TOOL 1 Quantity Score '[]'$  - Use when EandM and Procedure is performed on INITIAL visit 0 ASSESSMENTS - Nursing Assessment / Reassessment '[]'$  - General Physical Exam (combine w/ comprehensive assessment (listed just below) when performed on new 0 pt. evals) '[]'$  -  0 Comprehensive Assessment (HX, ROS, Risk Assessments, Wounds Hx, etc.) ASSESSMENTS - Wound and Skin Assessment / Reassessment '[]'$  - Dermatologic / Skin Assessment (not related to wound area) 0 ASSESSMENTS - Ostomy and/or Continence Assessment and Care '[]'$  - Incontinence Assessment and Management 0 '[]'$  - 0 Ostomy Care Assessment and Management (repouching, etc.) PROCESS - Coordination of Care '[]'$  - Simple Patient / Family Education for ongoing care 0 '[]'$  - 0 Complex (extensive) Patient / Family Education for ongoing care '[]'$  - 0 Staff obtains Programmer, systems, Records, Test Results / Process Orders '[]'$  - 0 Staff telephones HHA, Nursing Homes / Clarify orders / etc '[]'$  - 0 Routine Transfer to another Facility (non-emergent condition) '[]'$  - 0 Routine Hospital Admission (non-emergent condition) '[]'$  - 0 New Admissions / Biomedical engineer / Ordering NPWT, Apligraf, etc. '[]'$  - 0 Emergency Hospital Admission (emergent condition) PROCESS - Special Needs '[]'$  - Pediatric / Minor Patient Management 0 '[]'$  - 0 Isolation Patient Management '[]'$  - 0 Hearing / Language / Visual special needs '[]'$  - 0 Assessment of Community assistance (transportation, D/C planning, etc.) '[]'$  - 0 Additional assistance / Altered mentation '[]'$  - 0 Support Surface(s) Assessment (bed, cushion, seat, etc.) INTERVENTIONS - Miscellaneous '[]'$  - External ear exam 0 '[]'$  - 0 Patient Transfer (multiple staff / Civil Service fast streamer / Similar devices) '[]'$  - 0 Simple Staple / Suture removal (25 or less) '[]'$  - 0 Complex Staple / Suture removal (26 or more) '[]'$  - 0 Hypo/Hyperglycemic Management (do not check if billed separately) '[]'$  - 0 Ankle / Brachial Index (ABI) - do not check if billed separately Has the patient been seen at the hospital within the last three years: Yes Total Score: 0 Level Of Care: ____ [name] ([id number]) Electronic Signature(s) Signed: 11/12/2021 4:00:15 PM By: Levora Dredge Entered By: Levora Dredge on  11/12/2021 12:05:14 [name] ([id number]) -------------------------------------------------------------------------------- Encounter Discharge Information Details Patient Name: [name] Date of Service: 11/12/2021 10:45 AM Medical  Record Number: [id number] Patient Account Number: [id number] Date of Birth/Sex: 08-17-1965 (56 y.o. Male) Treating RN: Levora Dredge Primary Care Corayma Cashatt: [name] Other Clinician: Referring Makaiah Terwilliger: Anselm Pancoast Treating Jataya Wann/Extender: Skipper Cliche in Treatment: 4 Encounter Discharge Information Items Post Procedure Vitals Discharge Condition: Stable Temperature (F): 97.8 Ambulatory Status: Wheelchair Pulse (bpm): 81 Discharge Destination: Home Respiratory Rate (breaths/min): 18 Transportation: Other Blood Pressure (mmHg): 126/83 Accompanied By: self Schedule Follow-up Appointment: Yes Clinical Summary of Care: Electronic Signature(s) Signed: 11/12/2021 12:07:32 PM By: Levora Dredge Entered By: Levora Dredge on 11/12/2021 12:07:32 [name] ([id number]) -------------------------------------------------------------------------------- Lower Extremity Assessment Details Patient Name: [name] Date of Service: 11/12/2021 10:45 AM Medical Record Number: [id number] Patient Account Number: [id number] Date of Birth/Sex: May 02, 1965 (55 y.o. Male) Treating RN: Levora Dredge Primary Care Kiona Blume: [name] Other Clinician: Referring Tj Kitchings: Anselm Pancoast Treating Alishea Beaudin/Extender: Jeri Cos Weeks in Treatment: 4 Electronic Signature(s) Signed: 11/12/2021 4:00:15 PM By: Levora Dredge Entered By: Levora Dredge on 11/12/2021 11:29:43 [name] ([id number]) -------------------------------------------------------------------------------- Multi Wound Chart Details Patient Name: [name] Date of Service: 11/12/2021 10:45 AM Medical Record Number:  [id number] Patient Account Number: [id number] Date of Birth/Sex: Jan 22, 1966 (55 y.o. Male) Treating RN: Levora Dredge Primary Care Koula Venier: [name] Other Clinician: Referring Marciano Mundt: Anselm Pancoast Treating Sapir Lavey/Extender: Skipper Cliche in Treatment: 4 Vital Signs Height(in): 73 Pulse(bpm): 33 Weight(lbs): 154 Blood Pressure(mmHg): 113/78 Body Mass Index(BMI): 20.3 Temperature(F): 97.8 Respiratory Rate(breaths/min): 18 Photos: [N/A:N/A] Wound Location: Distal, Medial Sacrum Left Gluteus N/A Wounding Event: Pressure Injury Pressure Injury N/A Primary Etiology: Pressure Ulcer Pressure Ulcer N/A Comorbid History: Hypotension, Myocardial Infarction, Hypotension, Myocardial Infarction, N/A History of pressure wounds, History of pressure wounds, Rheumatoid Arthritis, Paraplegia, Rheumatoid Arthritis, Paraplegia, Confinement Anxiety Confinement Anxiety Date Acquired: 10/01/2021 09/05/2021 N/A Weeks of Treatment: 4 4 N/A Wound Status: Open Open N/A Wound Recurrence: No No N/A Measurements L x W x D (cm) 1.8x4x0.1 2.8x2x0.6 N/A Area (cm) : 5.655 4.398 N/A Volume (cm) : 0.565 2.639 N/A % Reduction in Area: 40.00% 20.00% N/A % Reduction in Volume: 85.00% 52.00% N/A Classification: Category/Stage III Category/Stage III N/A Exudate Amount: Medium Medium N/A Exudate Type: Serosanguineous Serosanguineous N/A Exudate Color: red, brown red, brown N/A Granulation Amount: Large (67-100%) Large (67-100%) N/A Granulation Quality: Red, Pink Red N/A Necrotic Amount: Small (1-33%) Small (1-33%) N/A Exposed Structures: Fat Layer (Subcutaneous Tissue): Fat Layer (Subcutaneous Tissue): N/A Yes Yes Epithelialization: Medium (34-66%) None N/A Treatment Notes Electronic Signature(s) Signed: 11/12/2021 4:00:15 PM By: Levora Dredge Entered By: Levora Dredge on 11/12/2021 11:37:43 [name]  ([id number]) -------------------------------------------------------------------------------- Multi-Disciplinary Care Plan Details Patient Name: [name] Date of Service: 11/12/2021 10:45 AM Medical Record Number: [id number] Patient Account Number: [id number] Date of Birth/Sex: 1965-05-17 (55 y.o. Male) Treating RN: Levora Dredge Primary Care Jibri Schriefer: [name] Other Clinician: Referring Gearldine Looney: Anselm Pancoast Treating Angellica Maddison/Extender: Skipper Cliche in Treatment: 4 Active Inactive Wound/Skin Impairment Nursing Diagnoses: Impaired tissue integrity Knowledge deficit related to ulceration/compromised skin integrity Goals: Ulcer/skin breakdown will have a volume reduction of 30% by week 4 Date Initiated: 10/15/2021 Target Resolution Date: 11/12/2021 Goal Status: Active Ulcer/skin breakdown will have a volume reduction of 50% by week 8 Date Initiated: 10/15/2021 Target Resolution Date: 12/10/2021 Goal Status: Active Ulcer/skin breakdown will have a volume reduction of 80% by week 12 Date Initiated: 10/15/2021 Target Resolution Date: 01/07/2022 Goal Status: Active Ulcer/skin breakdown will heal within 14 weeks Date Initiated: 10/15/2021 Target Resolution Date: 01/21/2022 Goal Status: Active Interventions: Assess patient/caregiver ability to  obtain necessary supplies Assess patient/caregiver ability to perform ulcer/skin care regimen upon admission and as needed Assess ulceration(s) every visit Provide education on ulcer and skin care Notes: Electronic Signature(s) Signed: 11/12/2021 4:00:15 PM By: Levora Dredge Entered By: Levora Dredge on 11/12/2021 11:37:36 [name] ([id number]) -------------------------------------------------------------------------------- Pain Assessment Details Patient Name: [name] Date of Service: 11/12/2021 10:45 AM Medical Record Number: [id number] Patient Account Number: [id number] Date of Birth/Sex: 1965-08-13  (55 y.o. Male) Treating RN: Levora Dredge Primary Care Mase Dhondt: [name] Other Clinician: Referring Kyriakos Babler: Anselm Pancoast Treating Orbie Grupe/Extender: Skipper Cliche in Treatment: 4 Active Problems Location of Pain Severity and Description of Pain Patient Has Paino No Site Locations Rate the pain. Current Pain Level: 0 Pain Management and Medication Current Pain Management: Electronic Signature(s) Signed: 11/12/2021 4:00:15 PM By: Levora Dredge Entered By: Levora Dredge on 11/12/2021 11:13:21 [name] ([id number]) -------------------------------------------------------------------------------- Patient/Caregiver Education Details Patient Name: [name] Date of Service: 11/12/2021 10:45 AM Medical Record Number: [id number] Patient Account Number: [id number] Date of Birth/Gender: 1965/10/27 (55 y.o. Male) Treating RN: Levora Dredge Primary Care Physician: [name] Other Clinician: Referring Physician: Anselm Pancoast Treating Physician/Extender: Skipper Cliche in Treatment: 4 Education Assessment Education Provided To: Patient Education Topics Provided Wound Debridement: Handouts: Wound Debridement Methods: Explain/Verbal Responses: State content correctly Wound/Skin Impairment: Handouts: Caring for Your Ulcer Methods: Explain/Verbal Responses: State content correctly Electronic Signature(s) Signed: 11/12/2021 4:00:15 PM By: Levora Dredge Entered By: Levora Dredge on 11/12/2021 12:05:40 [name] ([id number]) -------------------------------------------------------------------------------- Wound Assessment Details Patient Name: [name] Date of Service: 11/12/2021 10:45 AM Medical Record Number: [id number] Patient Account Number: [id number] Date of Birth/Sex: 1965-04-22 (55 y.o. Male) Treating RN: Levora Dredge Primary Care Mariem Skolnick: [name] Other Clinician: Referring Anselmo Reihl: Anselm Pancoast Treating  Jezabelle Chisolm/Extender: Skipper Cliche in Treatment: 4 Wound Status Wound Number: 7 Primary Pressure Ulcer Etiology: Wound Location: Distal, Medial Sacrum Wound Open Wounding Event: Pressure Injury Status: Date Acquired: 10/01/2021 Comorbid Hypotension, Myocardial Infarction, History of pressure Weeks Of Treatment: 4 History: wounds, Rheumatoid Arthritis, Paraplegia, Confinement Clustered Wound: No Anxiety Photos Wound Measurements Length: (cm) 1.8 Width: (cm) 4 Depth: (cm) 0.1 Area: (cm) 5.655 Volume: (cm) 0.565 % Reduction in Area: 40% % Reduction in Volume: 85% Epithelialization: Medium (34-66%) Tunneling: No Undermining: No Wound Description Classification: Category/Stage III Exudate Amount: Medium Exudate Type: Serosanguineous Exudate Color: red, brown Foul Odor After Cleansing: No Slough/Fibrino Yes Wound Bed Granulation Amount: Large (67-100%) Exposed Structure Granulation Quality: Red, Pink Fat Layer (Subcutaneous Tissue) Exposed: Yes Necrotic Amount: Small (1-33%) Necrotic Quality: Adherent Slough Treatment Notes Wound #7 (Sacrum) Wound Laterality: Medial, Distal Cleanser Soap and Water Discharge Instruction: Gently cleanse wound with antibacterial soap, rinse and pat dry prior to dressing wounds Peri-Wound Care Topical Primary Dressing BRYON, PARKER ([id number]) Hydrofera Blue Ready Transfer Foam, 4x5 (in/in) Discharge Instruction: Apply Hydrofera Blue Ready to wound bed as directed Secondary Dressing ABD Pad 5x9 (in/in) Discharge Instruction: Cover with ABD pad Secured With Medipore Tape - 24M Medipore H Soft Cloth Surgical Tape, 2x2 (in/yd) Compression Wrap Compression Stockings Add-Ons Electronic Signature(s) Signed: 11/12/2021 4:00:15 PM By: Levora Dredge Entered By: Levora Dredge on 11/12/2021 11:29:09 [name] ([id number]) -------------------------------------------------------------------------------- Wound  Assessment Details Patient Name: [name] Date of Service: 11/12/2021 10:45 AM Medical Record Number: [id number] Patient Account Number: [id number] Date of Birth/Sex: 1965/10/15 (55 y.o. Male) Treating RN: Levora Dredge Primary Care Tanaya Dunigan: [name] Other Clinician: Referring Zelene Barga: Anselm Pancoast Treating Blenda Wisecup/Extender: Skipper Cliche in Treatment:  4 Wound Status Wound Number: 8 Primary Pressure Ulcer Etiology: Wound Location: Left Gluteus Wound Open Wounding Event: Pressure Injury Status: Date Acquired: 09/05/2021 Comorbid Hypotension, Myocardial Infarction, History of pressure Weeks Of Treatment: 4 History: wounds, Rheumatoid Arthritis, Paraplegia, Confinement Clustered Wound: No Anxiety Photos Wound Measurements Length: (cm) 2.8 Width: (cm) 2 Depth: (cm) 0.6 Area: (cm) 4.398 Volume: (cm) 2.639 % Reduction in Area: 20% % Reduction in Volume: 52% Epithelialization: None Tunneling: No Undermining: No Wound Description Classification: Category/Stage III Exudate Amount: Medium Exudate Type: Serosanguineous Exudate Color: red, brown Foul Odor After Cleansing: No Slough/Fibrino Yes Wound Bed Granulation Amount: Large (67-100%) Exposed Structure Granulation Quality: Red Fat Layer (Subcutaneous Tissue) Exposed: Yes Necrotic Amount: Small (1-33%) Necrotic Quality: Adherent Slough Treatment Notes Wound #8 (Gluteus) Wound Laterality: Left Cleanser Soap and Water Discharge Instruction: Gently cleanse wound with antibacterial soap, rinse and pat dry prior to dressing wounds Peri-Wound Care Topical Primary Dressing LAFE, CLERK ([id number]) Hydrofera Blue Ready Transfer Foam, 4x5 (in/in) Discharge Instruction: Apply Hydrofera Blue Ready to wound bed as directed Secondary Dressing ABD Pad 5x9 (in/in) Discharge Instruction: Cover with ABD pad Secured With Medipore Tape - 24M Medipore H Soft Cloth Surgical Tape, 2x2 (in/yd) Compression  Wrap Compression Stockings Add-Ons Electronic Signature(s) Signed: 11/12/2021 4:00:15 PM By: Levora Dredge Entered By: Levora Dredge on 11/12/2021 11:29:34 [name] ([id number]) -------------------------------------------------------------------------------- Vitals Details Patient Name: [name] Date of Service: 11/12/2021 10:45 AM Medical Record Number: [id number] Patient Account Number: [id number] Date of Birth/Sex: 09-08-1965 (55 y.o. Male) Treating RN: Levora Dredge Primary Care Khady Vandenberg: [name] Other Clinician: Referring Anja Neuzil: Anselm Pancoast Treating Naja Apperson/Extender: Skipper Cliche in Treatment: 4 Vital Signs Time Taken: 11:11 Temperature (F): 97.8 Height (in): 73 Pulse (bpm): 81 Weight (lbs): 154 Respiratory Rate (breaths/min): 18 Body Mass Index (BMI): 20.3 Blood Pressure (mmHg): 113/78 Reference Range: 80 - 120 mg / dl Electronic Signature(s) Signed: 11/12/2021 4:00:15 PM By: Levora Dredge Entered By: Levora Dredge on 11/12/2021 11:12:56

## 2021-12-03 ENCOUNTER — Encounter: Payer: Medicare Other | Admitting: Physician Assistant

## 2021-12-03 DIAGNOSIS — L89893 Pressure ulcer of other site, stage 3: Secondary | ICD-10-CM | POA: Diagnosis not present

## 2021-12-03 NOTE — Progress Notes (Addendum)
MACSEN, NUTTALL ([id number]) Visit Report for 12/03/2021 Chief Complaint Document Details Patient Name: [name], [name] Date of Service: 12/03/2021 10:45 AM Medical Record Number: [id number] Patient Account Number: [id number] Date of Birth/Sex: 1965/04/16 (56 y.o. M) Treating RN: Cornell Barman Primary Care Provider: [name] Other Clinician: Referring Provider: [name] Treating Provider/Extender: Skipper Cliche in Treatment: 7 Information Obtained from: Patient Chief Complaint Sacral and back pressure ulcers Electronic Signature(s) Signed: 12/03/2021 10:53:01 AM By: Worthy Keeler PA-C Entered By: Worthy Keeler on 12/03/2021 10:53:00 [name] ([id number]) -------------------------------------------------------------------------------- HPI Details Patient Name: [name] Date of Service: 12/03/2021 10:45 AM Medical Record Number: [id number] Patient Account Number: [id number] Date of Birth/Sex: 28-Jun-1965 (55 y.o. M) Treating RN: Cornell Barman Primary Care Provider: [name] Other Clinician: Referring Provider: [name] Treating Provider/Extender: Skipper Cliche in Treatment: 7 History of Present Illness Associated Signs and Symptoms: Patient has a history of confirmed osteomyelitis of the left foot according to MRI July 2019. 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 HPI Description: 12/08/17 on evaluation today patient presents for initial evaluation and our clinic concerning issues he has been having with his feet bilaterally although the left more than right most recently. He was admitted to hospital where he was placed on IV vancomycin which actually he continue to utilize until discharge and even when discharged continue to be on until around 16 August according to what he tells me. His PICC line was removed at that point. Nonetheless he has been seen in the wound care  center in [facility] before transferring to [place] due to the fact that the doctor there left and they are no longer open. Nonetheless he does have significant osteomyelitis of the left foot noted on MRI which revealed significant issues with necrotic bone including a portion of the distal region of his left great toe which was felt to possibly either be missing as a result of amputation or potentially resorption. Either way he also had osteomyelitis noted of the digit, metatarsal region, cuneiform, and navicular bones. There is definite bone exposure noted externally at this point in time. In regard to the right foot he has issues with ulcerations here as well although he states that recently he's had no issues until this just blistered and reopened. Apparently Xeroform was used in this has caused things to be somewhat more moist and open more according to the patient. He's otherwise been tolerating the dressing changes without complication using silver alginate dressings. He has no discomfort but does seem to be extremely stressed regarding the fact that he did see Dr. 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 The patient stated per notes reviewed that he did not want to have any surgery and therefore has a repeat appointment scheduled with the surgeon on 12/15/17. With all that being said he feels like that he really has not been alerted to what was going on in the severity that was until just recently and has a lot of questions in that regard. I'll be see I explained that seeing him for the first time today I cannot answer a lot of those questions that he has. 12/21/17 on evaluation today patient actually appears to be doing a little better in regard to the right foot in particular. There is one area where there still definitive bone noted although the MRI which I did review today that we had  ordered was negative for any evidence of osteomyelitis which is good  news. Nonetheless there was some necrotic bone noted. Overall the patient appears to be doing fairly well however in regard to the right foot. Unfortunately he does have a new open area on the posterior aspect of his left leg as well as the continued area of necrosis noted in the central portion of the foot. He states that he's actually going to be sent for reevaluation specifically to a limb salvage clinic at [facility] he believes this is something that is primary care provider has been working with. They were just awaiting the results of the MRI. 01/08/18 on evaluation today patient actually appears to be doing maybe just a little better in regard to the overall appearance of his bilateral foot ulcers. In general this does not seem to have worsened at least which is good news. He still has evidence of bone noted on the surface of both wound areas which though dry appearing really has not otherwise changed at this time. He actually has an appointment at [facility] with the wound center to discuss limb salvage. He discusses with me at the last visit. Nonetheless he was somewhat upset today about a couple things one being that he stated that we did not put the order in for the protective dressing for his knees that we discussed last time. With that being said I had put in the order for the protective dressing in fact it was on his order sheet that was signed on 12/21/17. Nonetheless unfortunately it appears this was overlooked by home health and they told him if they had the order they could put the protective dressing on. Nonetheless they told him they did not have the order. Again I believe this was an oversight nothing intentional on anyone's part nonetheless the order was there. Still I had explained to the patient sometimes insurance will not cover for the protective dressing even if it is ordered and this was discussed in the last visit. 01/25/18 on evaluation today patient presents for follow-up  concerning his bilateral lower extremity ulcers. He has actually been doing about the same since I last saw him. We do have them in the room we can look at his gluteal region today as well to ensure that there is nothing open or if so address the issue there. Nonetheless he was supposed to see you and see wound care/limb salvage prior to his visit with me today. Unfortunately when he arrived last time at their clinic he apparently was there on the wrong day the doctor was not even present that he was his to be seen. Nonetheless I had to be switched to a different day and the patient actually is going in the next week to see them. Assuming everything works out this should actually be tomorrow. Nonetheless if they take over care of that point then we will subsequently release care to them. Readmission: 10-15-2021 upon evaluation today patient appears to be doing somewhat poorly in regard to wounds over the left gluteal region. He also has areas in the midline sacrum although this is not quite as bad. There is however some evidence of necrotic bone noted at this point unfortunately. The question is whether this is just something working its way out or if it something that is actually actively infected at this point. We are going to have to evaluate that further little by little as we go with additional diagnostics. With that being said the patient  tells me currently that what we are seeing him for has been open since around 09-05-2021 for the gluteal area on the left and in regard to the sacrum 10-01-2021 he states. With that being said he again is not too significant as far as the overall appearance of the wounds are concerned but again with the bone exposed this is something that we may want to do at pathology and culture in regard to. 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 This is a  gentleman whom I have previously seen back in 2019. The wound that we were taking care of back at that time is completely cleared. 7/25; 2-week follow-up. The patient has 3 small open areas in the lower sacrum and then an area over the left ischial tuberosity. Both of these are stage III wounds. He has been using Hydrofera Blue. 11-12-2021 upon evaluation today patient appears to be doing well currently in regard to his wounds. The distal sacral area is almost completely closed and looks to be doing great. In regard to the left gluteal region this is showing signs of improvement from a depth perspective size is about the same. Overall I am extremely pleased with where things stand compared to what we were previous. 12-03-2021 upon evaluation today patient appears to be doing well currently in regard to his wound. He is actually showing signs of improvement the sacral region and the right ischial/gluteal region is completely healed. The left ischial/gluteal region is still open but seems to be doing much better. [name], [name] ([id number]) Electronic Signature(s) Signed: 12/03/2021 11:37:08 AM By: Worthy Keeler PA-C Entered By: Worthy Keeler on 12/03/2021 11:37:07 [name] ([id number]) -------------------------------------------------------------------------------- Physical Exam Details Patient Name: [name] Date of Service: 12/03/2021 10:45 AM Medical Record Number: [id number] Patient Account Number: [id number] Date of Birth/Sex: 12/01/1965 (55 y.o. M) Treating RN: Cornell Barman Primary Care Provider: [name] Other Clinician: Referring Provider: [name] Treating Provider/Extender: Skipper Cliche in Treatment: 7 Constitutional Well-nourished and well-hydrated in no acute distress. Respiratory normal breathing without difficulty. Psychiatric this patient is able to make decisions and demonstrates good insight into disease process. Alert and Oriented  x 3. pleasant and cooperative. Notes Upon inspection patient's wound bed actually showed signs of good granulation and epithelization at this point. Fortunately I see no evidence of infection locally or systemically which is great news and overall I am extremely pleased with where we are today. Electronic Signature(s) Signed: 12/03/2021 11:37:24 AM By: Worthy Keeler PA-C Entered By: Worthy Keeler on 12/03/2021 11:37:24 [name] ([id number]) -------------------------------------------------------------------------------- Physician Orders Details Patient Name: [name] Date of Service: 12/03/2021 10:45 AM Medical Record Number: [id number] Patient Account Number: [id number] Date of Birth/Sex: [birth year] (55 y.o. M) Treating RN: Cornell Barman Primary Care Provider: [name] Other Clinician: Referring Provider: [name] Treating Provider/Extender: Skipper Cliche in Treatment: 7 Verbal / Phone Orders: No Diagnosis Coding ICD-10 Coding Code Description L89.153 Pressure ulcer of sacral region, stage 3 L89.893 Pressure ulcer of other site, stage 3 G82.54 Quadriplegia, C5-C7 incomplete Z93.1 Gastrostomy status Follow-up Appointments o Return Appointment in 3 weeks. o Nurse Visit as needed [place] for wound care. May utilize formulary equivalent dressing for wound treatment orders unless otherwise specified. Home Health Nurse may visit PRN to address patientos wound care  needs. o Scheduled days for dressing changes to be completed; exception, patient has scheduled wound care visit that day. o **Please direct any NON-WOUND related issues/requests for orders to patient's Primary Care Physician. **If current dressing causes regression in wound condition, may D/C ordered dressing product/s and apply Normal Saline Moist Dressing daily until next [place] or Other MD appointment.  **Notify Wound Healing Center of regression in wound condition at [phone]. Bathing/ Shower/ Hygiene o Wash wounds with antibacterial soap and water. o May shower; gently cleanse wound with antibacterial soap, rinse and pat dry prior to dressing wounds o No tub bath. Off-Loading o Turn and reposition every 2 hours o Other: - relive pressure from heels on bed Wound Treatment Wound #8 - Gluteus Wound Laterality: Left Cleanser: Soap and Water (Home Health) (Generic) 3 x Per Week/30 Days Discharge Instructions: Gently cleanse wound with antibacterial soap, rinse and pat dry prior to dressing wounds Primary Dressing: Hydrofera Blue Ready Transfer Foam, 4x5 (in/in) (Home Health) 3 x Per Week/30 Days Discharge Instructions: Apply Hydrofera Blue Ready to wound bed as directed Secondary Dressing: (BORDER) Zetuvit Plus SILICONE BORDER Dressing 5x5 (in/in) (Home Health) (Generic) 3 x Per Week/30 Days Discharge Instructions: Please do not put silicone bordered dressings under wraps. Use non-bordered dressing only. Wound #9 - Gluteus Wound Laterality: Left, Distal Cleanser: Soap and Water (Home Health) (Generic) 3 x Per Week/30 Days Discharge Instructions: Gently cleanse wound with antibacterial soap, rinse and pat dry prior to dressing wounds Primary Dressing: Hydrofera Blue Ready Transfer Foam, 4x5 (in/in) (Home Health) 3 x Per Week/30 Days Discharge Instructions: Apply Hydrofera Blue Ready to wound bed as directed Secondary Dressing: (BORDER) Zetuvit Plus SILICONE BORDER Dressing 5x5 (in/in) (Home Health) (Generic) 3 x Per Week/30 Days Discharge Instructions: Please do not put silicone bordered dressings under wraps. Use non-bordered dressing only. [name], [name] ([id number]) Electronic Signature(s) Signed: 12/03/2021 2:53:55 PM By: Gretta Cool, BSN, RN, CWS, Kim RN, BSN Signed: 12/03/2021 4:37:11 PM By: Worthy Keeler PA-C Entered By: Gretta Cool BSN, RN, CWS, Kim on 12/03/2021  12:24:41 [name] ([id number]) -------------------------------------------------------------------------------- Problem List Details Patient Name: [name] Date of Service: 12/03/2021 10:45 AM Medical Record Number: [id number] Patient Account Number: [id number] Date of Birth/Sex: 1965/05/01 (55 y.o. M) Treating RN: Cornell Barman Primary Care Provider: [name] Other Clinician: Referring Provider: [name] Treating Provider/Extender: Skipper Cliche in Treatment: 7 Active Problems ICD-10 Encounter Code Description Active Date MDM Diagnosis L89.153 Pressure ulcer of sacral region, stage 3 10/15/2021 No Yes L89.893 Pressure ulcer of other site, stage 3 10/15/2021 No Yes G82.54 Quadriplegia, C5-C7 incomplete 10/15/2021 No Yes Z93.1 Gastrostomy status 10/15/2021 No Yes Inactive Problems Resolved Problems Electronic Signature(s) Signed: 12/03/2021 10:52:52 AM By: Worthy Keeler PA-C Entered By: Worthy Keeler on 12/03/2021 10:52:52 [name] ([id number]) -------------------------------------------------------------------------------- Progress Note Details Patient Name: [name] Date of Service: 12/03/2021 10:45 AM Medical Record Number: [id number] Patient Account Number: [id number] Date of Birth/Sex: 20-Sep-1965 (55 y.o. M) Treating RN: Cornell Barman Primary Care Provider: [name] Other Clinician: Referring Provider: [name] Treating Provider/Extender: Skipper Cliche in Treatment: 7 Subjective Chief Complaint Information obtained from Patient Sacral and back pressure ulcers History of Present Illness (HPI) The following HPI elements were documented for the patient's wound: Associated Signs and Symptoms: Patient has a history of confirmed osteomyelitis of the left foot according to MRI July 2019. 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  12/08/17 on evaluation  today patient presents for initial evaluation and our clinic concerning issues he has been having with his feet bilaterally although the left more than right most recently. He was admitted to hospital where he was placed on IV vancomycin which actually he continue to utilize until discharge and even when discharged continue to be on until around 16 August according to what he tells me. His PICC line was removed at that point. Nonetheless he has been seen in the wound care center in [facility] before transferring to [place] due to the fact that the doctor there left and they are no longer open. Nonetheless he does have significant osteomyelitis of the left foot noted on MRI which revealed significant issues with necrotic bone including a portion of the distal region of his left great toe which was felt to possibly either be missing as a result of amputation or potentially resorption. Either way he also had osteomyelitis noted of the digit, metatarsal region, cuneiform, and navicular bones. There is definite bone exposure noted externally at this point in time. In regard to the right foot he has issues with ulcerations here as well although he states that recently he's had no issues until this just blistered and reopened. Apparently Xeroform was used in this has caused things to be somewhat more moist and open more according to the patient. He's otherwise been tolerating the dressing changes without complication using silver alginate dressings. He has no discomfort but does seem to be extremely stressed regarding the fact that he did see Dr. 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 The patient stated per notes reviewed that he did not want to have any surgery and therefore has a repeat appointment scheduled with the surgeon on 12/15/17. With all that being said he feels like that he really has not been alerted to what was going on in the severity that was until just  recently and has a lot of questions in that regard. I'll be see I explained that seeing him for the first time today I cannot answer a lot of those questions that he has. 12/21/17 on evaluation today patient actually appears to be doing a little better in regard to the right foot in particular. There is one area where there still definitive bone noted although the MRI which I did review today that we had ordered was negative for any evidence of osteomyelitis which is good news. Nonetheless there was some necrotic bone noted. Overall the patient appears to be doing fairly well however in regard to the right foot. Unfortunately he does have a new open area on the posterior aspect of his left leg as well as the continued area of necrosis noted in the central portion of the foot. He states that he's actually going to be sent for reevaluation specifically to a limb salvage clinic at [facility] he believes this is something that is primary care provider has been working with. They were just awaiting the results of the MRI. 01/08/18 on evaluation today patient actually appears to be doing maybe just a little better in regard to the overall appearance of his bilateral foot ulcers. In general this does not seem to have worsened at least which is good news. He still has evidence of bone noted on the surface of both wound areas which though dry appearing really has not otherwise changed at this time. He actually has an appointment at [facility] with the wound center  to discuss limb salvage. He discusses with me at the last visit. Nonetheless he was somewhat upset today about a couple things one being that he stated that we did not put the order in for the protective dressing for his knees that we discussed last time. With that being said I had put in the order for the protective dressing in fact it was on his order sheet that was signed on 12/21/17. Nonetheless unfortunately it appears this was overlooked by home  health and they told him if they had the order they could put the protective dressing on. Nonetheless they told him they did not have the order. Again I believe this was an oversight nothing intentional on anyone's part nonetheless the order was there. Still I had explained to the patient sometimes insurance will not cover for the protective dressing even if it is ordered and this was discussed in the last visit. 01/25/18 on evaluation today patient presents for follow-up concerning his bilateral lower extremity ulcers. He has actually been doing about the same since I last saw him. We do have them in the room we can look at his gluteal region today as well to ensure that there is nothing open or if so address the issue there. Nonetheless he was supposed to see you and see wound care/limb salvage prior to his visit with me today. Unfortunately when he arrived last time at their clinic he apparently was there on the wrong day the doctor was not even present that he was his to be seen. Nonetheless I had to be switched to a different day and the patient actually is going in the next week to see them. Assuming everything works out this should actually be tomorrow. Nonetheless if they take over care of that point then we will subsequently release care to them. Readmission: 10-15-2021 upon evaluation today patient appears to be doing somewhat poorly in regard to wounds over the left gluteal region. He also has areas in the midline sacrum although this is not quite as bad. There is however some evidence of necrotic bone noted at this point unfortunately. The question is whether this is just something working its way out or if it something that is actually actively infected at this point. We are going to have to evaluate that further little by little as we go with additional diagnostics. With that being said the patient tells me currently that what we are seeing him for has been open since around 09-05-2021 for  the gluteal area on the left and in regard to the sacrum 10-01-2021 he states. With that being said he again is not too significant as far as the overall appearance of the wounds are concerned but again with the bone exposed this is something that we may want to do at pathology and culture in regard to. 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 This is a gentleman whom I have previously seen back in 2019. The wound that we were taking care of back at that time is completely cleared. 7/25; 2-week follow-up. The patient has 3 small open areas in the lower sacrum and then an area over the left ischial tuberosity. Both of these are stage III wounds. He has been using Hydrofera Blue. [name], Colin B. ([id number]) 11-12-2021 upon evaluation today patient appears to be doing well currently in regard to his wounds. The distal sacral area is  almost completely closed and looks to be doing great. In regard to the left gluteal region this is showing signs of improvement from a depth perspective size is about the same. Overall I am extremely pleased with where things stand compared to what we were previous. 12-03-2021 upon evaluation today patient appears to be doing well currently in regard to his wound. He is actually showing signs of improvement the sacral region and the right ischial/gluteal region is completely healed. The left ischial/gluteal region is still open but seems to be doing much better. Objective Constitutional Well-nourished and well-hydrated in no acute distress. Vitals Time Taken: 11:11 AM, Height: 73 in, Weight: 154 lbs, BMI: 20.3, Temperature: 98.1 F, Pulse: 77 bpm, Respiratory Rate: 18 breaths/min, Blood Pressure: 113/76 mmHg. Respiratory normal breathing without difficulty. Psychiatric this patient is able to make decisions and demonstrates good insight into  disease process. Alert and Oriented x 3. pleasant and cooperative. General Notes: Upon inspection patient's wound bed actually showed signs of good granulation and epithelization at this point. Fortunately I see no evidence of infection locally or systemically which is great news and overall I am extremely pleased with where we are today. Integumentary (Hair, Skin) Wound #7 status is Healed - Epithelialized. Original cause of wound was Pressure Injury. The date acquired was: 10/01/2021. The wound has been in treatment 7 weeks. The wound is located on the [place]. The wound measures 0cm length x 0cm width x 0cm depth; 0cm^2 area and 0cm^3 volume. There is a medium amount of serosanguineous drainage noted. Wound #8 status is Open. Original cause of wound was Pressure Injury. The date acquired was: 09/05/2021. The wound has been in treatment 7 weeks. The wound is located on the Left Gluteus. The wound measures 2.8cm length x 1.2cm width x 0.3cm depth; 2.639cm^2 area and 0.792cm^3 volume. There is a medium amount of serosanguineous drainage noted. Wound #9 status is Open. Original cause of wound was Gradually Appeared. The date acquired was: 11/29/2021. The wound is located on the Left,Distal Gluteus. The wound measures 0.6cm length x 0.5cm width x 0.2cm depth; 0.236cm^2 area and 0.047cm^3 volume. Assessment Active Problems ICD-10 Pressure ulcer of sacral region, stage 3 Pressure ulcer of other site, stage 3 Quadriplegia, C5-C7 incomplete Gastrostomy status Plan 1. I am good recommend that we go ahead and have the patient continue to monitor for any signs of worsening or infection. Overall I think that he is really doing quite well. 2. I am also can recommend that we have the patient continue with the [facility] for the left gluteal/ischial location I think that this is good to be the ideal thing here. 3. I am also can recommend for the healed areas AandD ointment over the zinc which  I think will keep the skin better protected and keep it from drying out too much. [name], Colin B. ([id number]) We will see patient back for reevaluation in 2 weeks here in the clinic. If anything worsens or changes patient will contact our office for additional recommendations. Electronic Signature(s) Signed: 12/03/2021 11:38:08 AM By: Worthy Keeler PA-C Entered By: Worthy Keeler on 12/03/2021 11:38:07 [name] ([id number]) -------------------------------------------------------------------------------- SuperBill Details Patient Name: [name] Date of Service: 12/03/2021 Medical Record Number: [id number] Patient Account Number: [id number] Date of Birth/Sex: [birth year] (55 y.o. M) Treating RN: Cornell Barman Primary Care Provider: [name] Other Clinician: Referring Provider: [name] Treating Provider/Extender: Skipper Cliche in Treatment: 7 Diagnosis Coding ICD-10 Codes Code Description [phone] Pressure ulcer  of sacral region, stage 3 L89.893 Pressure ulcer of other site, stage 3 G82.54 Quadriplegia, C5-C7 incomplete Z93.1 Gastrostomy status Facility Procedures CPT4 Code: [id number] Description: 05110 - WOUND CARE VISIT-LEV 4 EST PT Modifier: Quantity: 1 Physician Procedures CPT4 Code: 2111735 Description: 67014 - WC PHYS LEVEL 3 - EST PT Modifier: Quantity: 1 CPT4 Code: Description: ICD-10 Diagnosis Description L89.153 Pressure ulcer of sacral region, stage 3 L89.893 Pressure ulcer of other site, stage 3 G82.54 Quadriplegia, C5-C7 incomplete Z93.1 Gastrostomy status Modifier: Quantity: Electronic Signature(s) Signed: 12/03/2021 2:53:55 PM By: Gretta Cool, BSN, RN, CWS, Kim RN, BSN Signed: 12/03/2021 4:37:11 PM By: Worthy Keeler PA-C Previous Signature: 12/03/2021 11:38:26 AM Version By: Worthy Keeler PA-C Entered By: Gretta Cool, BSN, RN, CWS, Kim on 12/03/2021 12:26:13

## 2021-12-03 NOTE — Progress Notes (Addendum)
BELAL, SCALLON ([id number]) Visit Report for 12/03/2021 Arrival Information Details Patient Name: [name], [name] Date of Service: 12/03/2021 10:45 AM Medical Record Number: [id number] Patient Account Number: [id number] Date of Birth/Sex: Oct 05, 1965 (56 y.o. M) Treating RN: Cornell Barman Primary Care Laresa Oshiro: [name] Other Clinician: Referring Anissa Abbs: [name] Treating Thorne Wirz/Extender: Skipper Cliche in Treatment: 7 Visit Information History Since Last Visit Added or deleted any medications: No Patient Arrived: Wheel Chair Has Dressing in Place as Prescribed: Yes Arrival Time: 11:10 Pain Present Now: No Accompanied By: self Transfer Assistance: Harrel Lemon Lift Patient Identification Verified: Yes Secondary Verification Process Completed: Yes Electronic Signature(s) Signed: 12/03/2021 2:53:55 PM By: Gretta Cool, BSN, RN, CWS, Kim RN, BSN Entered By: Gretta Cool, BSN, RN, CWS, Kim on 12/03/2021 11:11:17 [name] ([id number]) -------------------------------------------------------------------------------- Clinic Level of Care Assessment Details Patient Name: [name] Date of Service: 12/03/2021 10:45 AM Medical Record Number: [id number] Patient Account Number: [id number] Date of Birth/Sex: 1965/12/11 (56 y.o. M) Treating RN: Cornell Barman Primary Care Preesha Benjamin: [name] Other Clinician: Referring Korena Nass: [name] Treating Kajal Scalici/Extender: Skipper Cliche in Treatment: 7 Clinic Level of Care Assessment Items TOOL 4 Quantity Score '[]'$  - Use when only an EandM is performed on FOLLOW-UP visit 0 ASSESSMENTS - Nursing Assessment / Reassessment X - Reassessment of Co-morbidities (includes updates in patient status) 1 10 X- 1 5 Reassessment of Adherence to Treatment Plan ASSESSMENTS - Wound and Skin Assessment / Reassessment '[]'$  - Simple Wound Assessment / Reassessment - one wound 0 X- 2 5 Complex Wound Assessment / Reassessment - multiple wounds '[]'$   - 0 Dermatologic / Skin Assessment (not related to wound area) ASSESSMENTS - Focused Assessment '[]'$  - Circumferential Edema Measurements - multi extremities 0 '[]'$  - 0 Nutritional Assessment / Counseling / Intervention '[]'$  - 0 Lower Extremity Assessment (monofilament, tuning fork, pulses) '[]'$  - 0 Peripheral Arterial Disease Assessment (using hand held doppler) ASSESSMENTS - Ostomy and/or Continence Assessment and Care '[]'$  - Incontinence Assessment and Management 0 '[]'$  - 0 Ostomy Care Assessment and Management (repouching, etc.) PROCESS - Coordination of Care X - Simple Patient / Family Education for ongoing care 1 15 '[]'$  - 0 Complex (extensive) Patient / Family Education for ongoing care X- 1 10 Staff obtains Programmer, systems, Records, Test Results / Process Orders '[]'$  - 0 Staff telephones HHA, Nursing Homes / Clarify orders / etc '[]'$  - 0 Routine Transfer to another Facility (non-emergent condition) '[]'$  - 0 Routine Hospital Admission (non-emergent condition) '[]'$  - 0 New Admissions / Biomedical engineer / Ordering NPWT, Apligraf, etc. '[]'$  - 0 Emergency Hospital Admission (emergent condition) X- 1 10 Simple Discharge Coordination '[]'$  - 0 Complex (extensive) Discharge Coordination PROCESS - Special Needs '[]'$  - Pediatric / Minor Patient Management 0 '[]'$  - 0 Isolation Patient Management '[]'$  - 0 Hearing / Language / Visual special needs '[]'$  - 0 Assessment of Community assistance (transportation, D/C planning, etc.) '[]'$  - 0 Additional assistance / Altered mentation '[]'$  - 0 Support Surface(s) Assessment (bed, cushion, seat, etc.) INTERVENTIONS - Wound Cleansing / Measurement [name], [name] ([id number]) '[]'$  - 0 Simple Wound Cleansing - one wound X- 2 5 Complex Wound Cleansing - multiple wounds X- 1 5 Wound Imaging (photographs - any number of wounds) '[]'$  - 0 Wound Tracing (instead of photographs) '[]'$  - 0 Simple Wound Measurement - one wound X- 2 5 Complex Wound Measurement - multiple  wounds INTERVENTIONS - Wound Dressings '[]'$  - Small Wound Dressing one or multiple wounds 0 '[]'$  - 0 Medium Wound Dressing  one or multiple wounds X- 1 20 Large Wound Dressing one or multiple wounds '[]'$  - 0 Application of Medications - topical '[]'$  - 0 Application of Medications - injection INTERVENTIONS - Miscellaneous '[]'$  - External ear exam 0 '[]'$  - 0 Specimen Collection (cultures, biopsies, blood, body fluids, etc.) '[]'$  - 0 Specimen(s) / Culture(s) sent or taken to Lab for analysis X- 1 10 Patient Transfer (multiple staff / Civil Service fast streamer / Similar devices) '[]'$  - 0 Simple Staple / Suture removal (25 or less) '[]'$  - 0 Complex Staple / Suture removal (26 or more) '[]'$  - 0 Hypo / Hyperglycemic Management (close monitor of Blood Glucose) '[]'$  - 0 Ankle / Brachial Index (ABI) - do not check if billed separately X- 1 5 Vital Signs Has the patient been seen at the hospital within the last three years: Yes Total Score: 120 Level Of Care: New/Established - Level 4 Electronic Signature(s) Signed: 12/03/2021 2:53:55 PM By: Gretta Cool, BSN, RN, CWS, Kim RN, BSN Entered By: Gretta Cool, BSN, RN, CWS, Kim on 12/03/2021 12:26:03 [name] ([id number]) -------------------------------------------------------------------------------- Encounter Discharge Information Details Patient Name: [name] Date of Service: 12/03/2021 10:45 AM Medical Record Number: [id number] Patient Account Number: [id number] Date of Birth/Sex: Oct 17, 1965 (56 y.o. M) Treating RN: Cornell Barman Primary Care Arra Connaughton: [name] Other Clinician: Referring Nyal Schachter: [name] Treating Fletcher Ostermiller/Extender: Skipper Cliche in Treatment: 7 Encounter Discharge Information Items Discharge Condition: Stable Ambulatory Status: Wheelchair Discharge Destination: Home Transportation: Private Auto Schedule Follow-up Appointment: Yes Clinical Summary of Care: Electronic Signature(s) Signed: 12/03/2021 2:53:55 PM By: Gretta Cool, BSN,  RN, CWS, Kim RN, BSN Entered By: Gretta Cool, BSN, RN, CWS, Kim on 12/03/2021 69:48:54 [name] ([id number]) -------------------------------------------------------------------------------- Lower Extremity Assessment Details Patient Name: [name] Date of Service: 12/03/2021 10:45 AM Medical Record Number: [id number] Patient Account Number: [id number] Date of Birth/Sex: June 04, 1965 (55 y.o. M) Treating RN: Cornell Barman Primary Care Kerrie Timm: [name] Other Clinician: Referring Nicanor Mendolia: [name] Treating Sacred Roa/Extender: Skipper Cliche in Treatment: 7 Electronic Signature(s) Signed: 12/03/2021 2:53:55 PM By: Gretta Cool, BSN, RN, CWS, Kim RN, BSN Entered By: Gretta Cool, BSN, RN, CWS, Kim on 12/03/2021 11:22:00 [name] ([id number]) -------------------------------------------------------------------------------- Multi Wound Chart Details Patient Name: [name] Date of Service: 12/03/2021 10:45 AM Medical Record Number: [id number] Patient Account Number: [id number] Date of Birth/Sex: [birth year] (55 y.o. M) Treating RN: Cornell Barman Primary Care Rigby Leonhardt: [name] Other Clinician: Referring Annalynne Ibanez: [name] Treating Keanu Lesniak/Extender: Skipper Cliche in Treatment: 7 Vital Signs Height(in): 73 Pulse(bpm): 23 Weight(lbs): 154 Blood Pressure(mmHg): 113/76 Body Mass Index(BMI): 20.3 Temperature(F): 98.1 Respiratory Rate(breaths/min): 18 Photos: Wound Location: Distal, Medial Sacrum Left Gluteus Left, Distal Gluteus Wounding Event: Pressure Injury Pressure Injury Gradually Appeared Primary Etiology: Pressure Ulcer Pressure Ulcer Pressure Ulcer Secondary Etiology: N/A N/A MASD Date Acquired: 10/01/2021 09/05/2021 11/29/2021 Weeks of Treatment: 7 7 0 Wound Status: Healed - Epithelialized Open Open Wound Recurrence: No No No Measurements L x W x D (cm) 0x0x0 2.8x1.2x0.3 0.6x0.5x0.2 Area (cm) : 0 2.639 0.236 Volume (cm) : 0 0.792 0.047 %  Reduction in Area: 100.00% 52.00% N/A % Reduction in Volume: 100.00% 85.60% N/A Classification: Category/Stage III Category/Stage III N/A Exudate Amount: Medium Medium N/A Exudate Type: Serosanguineous Serosanguineous N/A Exudate Color: red, brown red, brown N/A Treatment Notes Electronic Signature(s) Signed: 12/03/2021 2:53:55 PM By: Gretta Cool, BSN, RN, CWS, Kim RN, BSN Entered By: Gretta Cool, BSN, RN, CWS, Kim on 12/03/2021 12:21:24 [name] ([id number]) -------------------------------------------------------------------------------- [place] Details Patient Name: Oswaldo Milian, Oran B.  Date of Service: 12/03/2021 10:45 AM Medical Record Number: [id number] Patient Account Number: [id number] Date of Birth/Sex: 09/28/1965 (55 y.o. M) Treating RN: Cornell Barman Primary Care Priseis Cratty: [name] Other Clinician: Referring Curlie Macken: [name] Treating Marice Angelino/Extender: Skipper Cliche in Treatment: 7 Active Inactive Pressure Nursing Diagnoses: Knowledge deficit related to causes and risk factors for pressure ulcer development Knowledge deficit related to management of pressures ulcers Potential for impaired tissue integrity related to pressure, friction, moisture, and shear Goals: Patient will remain free from development of additional pressure ulcers Date Initiated: 12/03/2021 Target Resolution Date: 12/03/2021 Goal Status: Active Patient/caregiver will verbalize risk factors for pressure ulcer development Date Initiated: 12/03/2021 Target Resolution Date: 12/03/2021 Goal Status: Active Patient/caregiver will verbalize understanding of pressure ulcer management Date Initiated: 12/03/2021 Target Resolution Date: 12/03/2021 Goal Status: Active Interventions: Assess: immobility, friction, shearing, incontinence upon admission and as needed Assess offloading mechanisms upon admission and as needed Assess potential for pressure ulcer upon admission and as  needed Provide education on pressure ulcers Notes: Wound/Skin Impairment Nursing Diagnoses: Impaired tissue integrity Knowledge deficit related to ulceration/compromised skin integrity Goals: Ulcer/skin breakdown will have a volume reduction of 30% by week 4 Date Initiated: 10/15/2021 Target Resolution Date: 11/12/2021 Goal Status: Active Ulcer/skin breakdown will have a volume reduction of 50% by week 8 Date Initiated: 10/15/2021 Target Resolution Date: 12/10/2021 Goal Status: Active Ulcer/skin breakdown will have a volume reduction of 80% by week 12 Date Initiated: 10/15/2021 Target Resolution Date: 01/07/2022 Goal Status: Active Ulcer/skin breakdown will heal within 14 weeks Date Initiated: 10/15/2021 Target Resolution Date: 01/21/2022 Goal Status: Active Interventions: Assess patient/caregiver ability to obtain necessary supplies Assess patient/caregiver ability to perform ulcer/skin care regimen upon admission and as needed Assess ulceration(s) every visit Provide education on ulcer and skin care DAOUD, LOBUE ([id number]) Notes: Electronic Signature(s) Signed: 12/03/2021 2:53:55 PM By: Gretta Cool, BSN, RN, CWS, Kim RN, BSN Entered By: Gretta Cool, BSN, RN, CWS, Kim on 12/03/2021 12:21:10 [name] ([id number]) -------------------------------------------------------------------------------- Pain Assessment Details Patient Name: [name] Date of Service: 12/03/2021 10:45 AM Medical Record Number: [id number] Patient Account Number: [id number] Date of Birth/Sex: [birth year] (55 y.o. M) Treating RN: Cornell Barman Primary Care Rachael Zapanta: [name] Other Clinician: Referring Louanna Vanliew: [name] Treating Sharonica Kraszewski/Extender: Skipper Cliche in Treatment: 7 Active Problems Location of Pain Severity and Description of Pain Patient Has Paino No Site Locations Pain Management and Medication Current Pain Management: Electronic Signature(s) Signed: 12/03/2021  2:53:55 PM By: Gretta Cool, BSN, RN, CWS, Kim RN, BSN Entered By: Gretta Cool, BSN, RN, CWS, Kim on 12/03/2021 11:11:46 [name] ([id number]) -------------------------------------------------------------------------------- Patient/Caregiver Education Details Patient Name: [name] Date of Service: 12/03/2021 10:45 AM Medical Record Number: [id number] Patient Account Number: [id number] Date of Birth/Gender: Jul 24, 1965 (55 y.o. M) Treating RN: Cornell Barman Primary Care Physician: [name] Other Clinician: Referring Physician: [name] Treating Physician/Extender: Skipper Cliche in Treatment: 7 Education Assessment Education Provided To: Patient Education Topics Provided Pressure: Handouts: Preventing Pressure Ulcers Methods: Explain/Verbal Responses: State content correctly Wound/Skin Impairment: Handouts: Caring for Your Ulcer Methods: Demonstration Responses: State content correctly Electronic Signature(s) Signed: 12/03/2021 2:53:55 PM By: Gretta Cool, BSN, RN, CWS, Kim RN, BSN Entered By: Gretta Cool, BSN, RN, CWS, Kim on 12/03/2021 12:27:25 [name] ([id number]) -------------------------------------------------------------------------------- Wound Assessment Details Patient Name: [name] Date of Service: 12/03/2021 10:45 AM Medical Record Number: [id number] Patient Account Number: [id number] Date of Birth/Sex: [birth year] (55 y.o. M) Treating RN: Cornell Barman Primary Care Houda Brau: [name] Other Clinician: Referring Trev Boley:  [name] Treating Zailynn Brandel/Extender: Jeri Cos Weeks in Treatment: 7 Wound Status Wound Number: 7 Primary Etiology: Pressure Ulcer Wound Location: Distal, Medial Sacrum Wound Status: Healed - Epithelialized Wounding Event: Pressure Injury Date Acquired: 10/01/2021 Weeks Of Treatment: 7 Clustered Wound: No Photos Photo Uploaded By: Gretta Cool, BSN, RN, CWS, Kim on 12/03/2021 11:23:09 Wound Measurements Length: (cm)  0 Width: (cm) 0 Depth: (cm) 0 Area: (cm) 0 Volume: (cm) 0 % Reduction in Area: 100% % Reduction in Volume: 100% Wound Description Classification: Category/Stage III Exudate Amount: Medium Exudate Type: Serosanguineous Exudate Color: red, brown Treatment Notes Wound #7 (Sacrum) Wound Laterality: Medial, Distal Cleanser Peri-Wound Care Topical Primary Dressing Secondary Dressing Secured With Compression Wrap Compression Stockings Add-Ons ISAIAS, DOWSON ([id number]) Electronic Signature(s) Signed: 12/03/2021 2:53:55 PM By: Gretta Cool, BSN, RN, CWS, Kim RN, BSN Entered By: Gretta Cool, BSN, RN, CWS, Kim on 12/03/2021 11:21:50 [name] ([id number]) -------------------------------------------------------------------------------- Wound Assessment Details Patient Name: [name] Date of Service: 12/03/2021 10:45 AM Medical Record Number: [id number] Patient Account Number: [id number] Date of Birth/Sex: [birth year] (55 y.o. M) Treating RN: Cornell Barman Primary Care Castiel Lauricella: [name] Other Clinician: Referring Colyn Miron: [name] Treating Shaeleigh Graw/Extender: Jeri Cos Weeks in Treatment: 7 Wound Status Wound Number: 8 Primary Etiology: Pressure Ulcer Wound Location: Left Gluteus Wound Status: Open Wounding Event: Pressure Injury Date Acquired: 09/05/2021 Weeks Of Treatment: 7 Clustered Wound: No Photos Photo Uploaded By: Gretta Cool, BSN, RN, CWS, Kim on 12/03/2021 11:23:44 Wound Measurements Length: (cm) 2.8 Width: (cm) 1.2 Depth: (cm) 0.3 Area: (cm) 2.639 Volume: (cm) 0.792 % Reduction in Area: 52% % Reduction in Volume: 85.6% Wound Description Classification: Category/Stage III Exudate Amount: Medium Exudate Type: Serosanguineous Exudate Color: red, brown Treatment Notes Wound #8 (Gluteus) Wound Laterality: Left Cleanser Soap and Water Discharge Instruction: Gently cleanse wound with antibacterial soap, rinse and pat dry prior to dressing  wounds Peri-Wound Care Topical Primary Dressing Hydrofera Blue Ready Transfer Foam, 4x5 (in/in) Discharge Instruction: Apply Hydrofera Blue Ready to wound bed as directed Secondary Dressing (BORDER) Zetuvit Plus SILICONE BORDER Dressing 5x5 (in/in) Aguino, Chosen B. ([id number]) Discharge Instruction: Please do not put silicone bordered dressings under wraps. Use non-bordered dressing only. Secured With Compression Wrap Compression Stockings Environmental education officer) Signed: 12/03/2021 2:53:55 PM By: Gretta Cool, BSN, RN, CWS, Kim RN, BSN Entered By: Gretta Cool, BSN, RN, CWS, Kim on 12/03/2021 11:21:50 [name] ([id number]) -------------------------------------------------------------------------------- Wound Assessment Details Patient Name: [name] Date of Service: 12/03/2021 10:45 AM Medical Record Number: [id number] Patient Account Number: [id number] Date of Birth/Sex: [birth year] (55 y.o. M) Treating RN: Cornell Barman Primary Care Earle Burson: [name] Other Clinician: Referring Shavonne Ambroise: [name] Treating Orvill Coulthard/Extender: Skipper Cliche in Treatment: 7 Wound Status Wound Number: 9 Primary Pressure Ulcer Etiology: Wound Location: Left, Distal Gluteus Secondary MASD Wounding Event: Gradually Appeared Etiology: Date Acquired: 11/29/2021 Wound Open Weeks Of Treatment: 0 Status: Clustered Wound: No Comorbid Hypotension, Myocardial Infarction, History of pressure History: wounds, Rheumatoid Arthritis, Paraplegia, Confinement Anxiety Photos Wound Measurements Length: (cm) 0.6 % Reduc Width: (cm) 0.5 % Reduc Depth: (cm) 0.2 Epithel Area: (cm) 0.236 Tunnel Volume: (cm) 0.047 Underm tion in Area: 0% tion in Volume: 0% ialization: Small (1-33%) ing: No ining: No Wound Description Classification: Category/Stage II Foul Od Wound Margin: Flat and Intact Slough/ Exudate Amount: Medium Exudate Type: Serous Exudate Color: amber or After  Cleansing: No Fibrino No Wound Bed Granulation Amount: Large (67-100%) Exposed Structure Granulation Quality: Red Fascia Exposed: No Necrotic Amount: None Present (0%) Fat Layer (Subcutaneous Tissue)  Exposed: Yes Tendon Exposed: No Muscle Exposed: No Joint Exposed: No Bone Exposed: No Treatment Notes Wound #9 (Gluteus) Wound Laterality: Left, Distal Cleanser Soap and Water Beyene, Whalen B. ([id number]) Discharge Instruction: Gently cleanse wound with antibacterial soap, rinse and pat dry prior to dressing wounds Peri-Wound Care Topical Primary Dressing Hydrofera Blue Ready Transfer Foam, 4x5 (in/in) Discharge Instruction: Apply Hydrofera Blue Ready to wound bed as directed Secondary Dressing (BORDER) Zetuvit Plus SILICONE BORDER Dressing 5x5 (in/in) Discharge Instruction: Please do not put silicone bordered dressings under wraps. Use non-bordered dressing only. Secured With Compression Wrap Compression Stockings Environmental education officer) Signed: 12/03/2021 2:53:55 PM By: Gretta Cool, BSN, RN, CWS, Kim RN, BSN Entered By: Gretta Cool, BSN, RN, CWS, Kim on 12/03/2021 12:24:17 [name] ([id number]) -------------------------------------------------------------------------------- [place] Details Patient Name: [name] Date of Service: 12/03/2021 10:45 AM Medical Record Number: [id number] Patient Account Number: [id number] Date of Birth/Sex: 01/31/1966 (55 y.o. M) Treating RN: Cornell Barman Primary Care Yakima Kreitzer: [name] Other Clinician: Referring Nicky Milhouse: [name] Treating Nicolas Sisler/Extender: Skipper Cliche in Treatment: 7 Vital Signs Time Taken: 11:11 Temperature (F): 98.1 Height (in): 73 Pulse (bpm): 77 Weight (lbs): 154 Respiratory Rate (breaths/min): 18 Body Mass Index (BMI): 20.3 Blood Pressure (mmHg): 113/76 Reference Range: 80 - 120 mg / dl Electronic Signature(s) Signed: 12/03/2021 2:53:55 PM By: Gretta Cool, BSN, RN, CWS, Kim RN,  BSN Entered By: Gretta Cool, BSN, RN, CWS, Kim on 12/03/2021 11:11:40

## 2021-12-24 ENCOUNTER — Encounter: Payer: 59 | Attending: Physician Assistant | Admitting: Physician Assistant

## 2021-12-24 DIAGNOSIS — L89893 Pressure ulcer of other site, stage 3: Secondary | ICD-10-CM | POA: Diagnosis not present

## 2021-12-24 DIAGNOSIS — G8254 Quadriplegia, C5-C7 incomplete: Secondary | ICD-10-CM | POA: Diagnosis not present

## 2021-12-24 DIAGNOSIS — Z6820 Body mass index (BMI) 20.0-20.9, adult: Secondary | ICD-10-CM | POA: Diagnosis not present

## 2021-12-24 DIAGNOSIS — Z931 Gastrostomy status: Secondary | ICD-10-CM | POA: Diagnosis not present

## 2021-12-24 DIAGNOSIS — L89153 Pressure ulcer of sacral region, stage 3: Secondary | ICD-10-CM | POA: Diagnosis present

## 2021-12-24 DIAGNOSIS — E46 Unspecified protein-calorie malnutrition: Secondary | ICD-10-CM | POA: Insufficient documentation

## 2021-12-24 NOTE — Progress Notes (Addendum)
PIETER, FOOKS ([id number]) Visit Report for 12/24/2021 Arrival Information Details Patient Name: [name], [name] Date of Service: 12/24/2021 11:15 AM Medical Record Number: [id number] Patient Account Number: [id number] Date of Birth/Sex: 1966-02-28 (56 y.o. M) Treating RN: Carlene Coria Primary Care Melo Stauber: [name] Other Clinician: Massie Kluver Referring Brynja Marker: [name] Treating Bisma Klett/Extender: Skipper Cliche in Treatment: 10 Visit Information History Since Last Visit All ordered tests and consults were completed: No Patient Arrived: Wheel Chair Added or deleted any medications: No Arrival Time: 11:25 Any new allergies or adverse reactions: No Transfer Assistance: Harrel Lemon Lift Had a fall or experienced change in No activities of daily living that may affect risk of falls: Hospitalized since last visit: No Pain Present Now: No Electronic Signature(s) Signed: 12/26/2021 11:41:41 AM By: Massie Kluver Entered By: Massie Kluver on 12/24/2021 11:25:28 [name] ([id number]) -------------------------------------------------------------------------------- Clinic Level of Care Assessment Details Patient Name: [name] Date of Service: 12/24/2021 11:15 AM Medical Record Number: [id number] Patient Account Number: [id number] Date of Birth/Sex: [birth year] (56 y.o. M) Treating RN: Carlene Coria Primary Care Elizah Mierzwa: [name] Other Clinician: Massie Kluver Referring Jakwon Gayton: [name] Treating Dwayna Kentner/Extender: Skipper Cliche in Treatment: 10 Clinic Level of Care Assessment Items TOOL 4 Quantity Score '[]'$  - Use when only an EandM is performed on FOLLOW-UP visit 0 ASSESSMENTS - Nursing Assessment / Reassessment X - Reassessment of Co-morbidities (includes updates in patient status) 1 10 X- 1 5 Reassessment of Adherence to Treatment Plan ASSESSMENTS - Wound and Skin Assessment / Reassessment '[]'$  - Simple Wound Assessment /  Reassessment - one wound 0 X- 2 5 Complex Wound Assessment / Reassessment - multiple wounds '[]'$  - 0 Dermatologic / Skin Assessment (not related to wound area) ASSESSMENTS - Focused Assessment '[]'$  - Circumferential Edema Measurements - multi extremities 0 '[]'$  - 0 Nutritional Assessment / Counseling / Intervention '[]'$  - 0 Lower Extremity Assessment (monofilament, tuning fork, pulses) '[]'$  - 0 Peripheral Arterial Disease Assessment (using hand held doppler) ASSESSMENTS - Ostomy and/or Continence Assessment and Care '[]'$  - Incontinence Assessment and Management 0 '[]'$  - 0 Ostomy Care Assessment and Management (repouching, etc.) PROCESS - Coordination of Care X - Simple Patient / Family Education for ongoing care 1 15 '[]'$  - 0 Complex (extensive) Patient / Family Education for ongoing care '[]'$  - 0 Staff obtains Programmer, systems, Records, Test Results / Process Orders '[]'$  - 0 Staff telephones HHA, Nursing Homes / Clarify orders / etc '[]'$  - 0 Routine Transfer to another Facility (non-emergent condition) '[]'$  - 0 Routine Hospital Admission (non-emergent condition) '[]'$  - 0 New Admissions / Biomedical engineer / Ordering NPWT, Apligraf, etc. '[]'$  - 0 Emergency Hospital Admission (emergent condition) X- 1 10 Simple Discharge Coordination '[]'$  - 0 Complex (extensive) Discharge Coordination PROCESS - Special Needs '[]'$  - Pediatric / Minor Patient Management 0 '[]'$  - 0 Isolation Patient Management '[]'$  - 0 Hearing / Language / Visual special needs '[]'$  - 0 Assessment of Community assistance (transportation, D/C planning, etc.) '[]'$  - 0 Additional assistance / Altered mentation '[]'$  - 0 Support Surface(s) Assessment (bed, cushion, seat, etc.) INTERVENTIONS - Wound Cleansing / Measurement [name], Colin B. ([id number]) '[]'$  - 0 Simple Wound Cleansing - one wound X- 2 5 Complex Wound Cleansing - multiple wounds '[]'$  - 0 Wound Imaging (photographs - any number of wounds) X- 1 5 Wound Tracing (instead of  photographs) '[]'$  - 0 Simple Wound Measurement - one wound X- 2 5 Complex Wound Measurement - multiple wounds INTERVENTIONS - Wound Dressings '[]'$  -  Small Wound Dressing one or multiple wounds 0 X- 2 15 Medium Wound Dressing one or multiple wounds '[]'$  - 0 Large Wound Dressing one or multiple wounds '[]'$  - 0 Application of Medications - topical '[]'$  - 0 Application of Medications - injection INTERVENTIONS - Miscellaneous '[]'$  - External ear exam 0 '[]'$  - 0 Specimen Collection (cultures, biopsies, blood, body fluids, etc.) '[]'$  - 0 Specimen(s) / Culture(s) sent or taken to Lab for analysis '[]'$  - 0 Patient Transfer (multiple staff / Harrel Lemon Lift / Similar devices) '[]'$  - 0 Simple Staple / Suture removal (25 or less) '[]'$  - 0 Complex Staple / Suture removal (26 or more) '[]'$  - 0 Hypo / Hyperglycemic Management (close monitor of Blood Glucose) '[]'$  - 0 Ankle / Brachial Index (ABI) - do not check if billed separately X- 1 5 Vital Signs Has the patient been seen at the hospital within the last three years: Yes Total Score: 110 Level Of Care: New/Established - Level 3 Electronic Signature(s) Signed: 12/26/2021 11:41:41 AM By: Massie Kluver Entered By: Massie Kluver on 12/24/2021 12:24:18 [name] ([id number]) -------------------------------------------------------------------------------- Encounter Discharge Information Details Patient Name: [name] Date of Service: 12/24/2021 11:15 AM Medical Record Number: [id number] Patient Account Number: [id number] Date of Birth/Sex: 09-06-1965 (56 y.o. M) Treating RN: Carlene Coria Primary Care Vernal Rutan: [name] Other Clinician: Massie Kluver Referring Mahlik Lenn: [name] Treating Kristain Hu/Extender: Skipper Cliche in Treatment: 10 Encounter Discharge Information Items Discharge Condition: Stable Ambulatory Status: Wheelchair Discharge Destination: Home Transportation: Other Accompanied By: self Schedule Follow-up  Appointment: Yes Clinical Summary of Care: Electronic Signature(s) Signed: 12/26/2021 11:41:41 AM By: Massie Kluver Entered By: Massie Kluver on 12/24/2021 13:10:52 [name] ([id number]) -------------------------------------------------------------------------------- Multi Wound Chart Details Patient Name: [name] Date of Service: 12/24/2021 11:15 AM Medical Record Number: [id number] Patient Account Number: [id number] Date of Birth/Sex: 07/15/1965 (56 y.o. M) Treating RN: Carlene Coria Primary Care Vyom Brass: [name] Other Clinician: Massie Kluver Referring Malisha Mabey: [name] Treating Avram Danielson/Extender: Skipper Cliche in Treatment: 10 Vital Signs Height(in): 73 Pulse(bpm): 53 Weight(lbs): 154 Blood Pressure(mmHg): 146/89 Body Mass Index(BMI): 20.3 Temperature(F): 97.8 Respiratory Rate(breaths/min): 16 Photos: [7R:No Photos] [9:No Photos] Wound Location: Distal, Medial Sacrum Left Gluteus Left, Distal Gluteus Wounding Event: Pressure Injury Pressure Injury Gradually Appeared Primary Etiology: Pressure Ulcer Pressure Ulcer Pressure Ulcer Secondary Etiology: N/A N/A MASD Comorbid History: Hypotension, Myocardial Infarction, Hypotension, Myocardial Infarction, N/A History of pressure wounds, History of pressure wounds, Rheumatoid Arthritis, Paraplegia, Rheumatoid Arthritis, Paraplegia, Confinement Anxiety Confinement Anxiety Date Acquired: 10/01/2021 09/05/2021 11/29/2021 Weeks of Treatment: '10 10 3 '$ Wound Status: Open Open Healed - Epithelialized Wound Recurrence: Yes No No Measurements L x W x D (cm) 0.9x2.3x0.1 2.5x5x0.4 0x0x0 Area (cm) : 1.626 9.817 0 Volume (cm) : 0.163 3.927 0 % Reduction in Area: 82.70% -78.60% 100.00% % Reduction in Volume: 95.70% 28.60% 100.00% Classification: Category/Stage III Category/Stage III Category/Stage II Exudate Amount: Medium Medium Medium Exudate Type: Serosanguineous Serosanguineous Serous Exudate  Color: red, brown red, brown amber Granulation Amount: Small (1-33%) Small (1-33%) N/A Granulation Quality: Red Red, Pink N/A Necrotic Amount: N/A Small (1-33%) N/A Exposed Structures: Fat Layer (Subcutaneous Tissue): Fat Layer (Subcutaneous Tissue): N/A Yes Yes Fascia: No Fascia: No Tendon: No Tendon: No Muscle: No Muscle: No Joint: No Joint: No Bone: No Bone: No Epithelialization: N/A Small (1-33%) N/A Treatment Notes Electronic Signature(s) Signed: 12/26/2021 11:41:41 AM By: Massie Kluver Entered By: Massie Kluver on 12/24/2021 12:21:54 [name], Colin B. ([id number]) [name], Colin B. ([id number]) -------------------------------------------------------------------------------- [place]  Plan Details Patient Name: [name], [name] Date of Service: 12/24/2021 11:15 AM Medical Record Number: [id number] Patient Account Number: [id number] Date of Birth/Sex: [birth year] (56 y.o. M) Treating RN: Carlene Coria Primary Care Delva Derden: [name] Other Clinician: Massie Kluver Referring Ladislaus Repsher: [name] Treating Benuel Ly/Extender: Skipper Cliche in Treatment: 10 Active Inactive Pressure Nursing Diagnoses: Knowledge deficit related to causes and risk factors for pressure ulcer development Knowledge deficit related to management of pressures ulcers Potential for impaired tissue integrity related to pressure, friction, moisture, and shear Goals: Patient will remain free from development of additional pressure ulcers Date Initiated: 12/03/2021 Target Resolution Date: 12/03/2021 Goal Status: Active Patient/caregiver will verbalize risk factors for pressure ulcer development Date Initiated: 12/03/2021 Target Resolution Date: 12/03/2021 Goal Status: Active Patient/caregiver will verbalize understanding of pressure ulcer management Date Initiated: 12/03/2021 Target Resolution Date: 12/03/2021 Goal Status: Active Interventions: Assess: immobility,  friction, shearing, incontinence upon admission and as needed Assess offloading mechanisms upon admission and as needed Assess potential for pressure ulcer upon admission and as needed Provide education on pressure ulcers Notes: Wound/Skin Impairment Nursing Diagnoses: Impaired tissue integrity Knowledge deficit related to ulceration/compromised skin integrity Goals: Ulcer/skin breakdown will have a volume reduction of 30% by week 4 Date Initiated: 10/15/2021 Target Resolution Date: 11/12/2021 Goal Status: Active Ulcer/skin breakdown will have a volume reduction of 50% by week 8 Date Initiated: 10/15/2021 Target Resolution Date: 12/10/2021 Goal Status: Active Ulcer/skin breakdown will have a volume reduction of 80% by week 12 Date Initiated: 10/15/2021 Target Resolution Date: 01/07/2022 Goal Status: Active Ulcer/skin breakdown will heal within 14 weeks Date Initiated: 10/15/2021 Target Resolution Date: 01/21/2022 Goal Status: Active Interventions: Assess patient/caregiver ability to obtain necessary supplies Assess patient/caregiver ability to perform ulcer/skin care regimen upon admission and as needed Assess ulceration(s) every visit Provide education on ulcer and skin care [name], Colin B. ([id number]) Notes: Electronic Signature(s) Signed: 12/26/2021 11:41:41 AM By: Massie Kluver Signed: 12/27/2021 9:44:45 AM By: Carlene Coria RN Entered By: Massie Kluver on 12/24/2021 12:21:48 [name] ([id number]) -------------------------------------------------------------------------------- Pain Assessment Details Patient Name: [name] Date of Service: 12/24/2021 11:15 AM Medical Record Number: [id number] Patient Account Number: [id number] Date of Birth/Sex: 06-01-1965 (56 y.o. M) Treating RN: Carlene Coria Primary Care Autumn Pruitt: [name] Other Clinician: Massie Kluver Referring Ericia Moxley: [name] Treating Shaylynne Lunt/Extender: Skipper Cliche in  Treatment: 10 Active Problems Location of Pain Severity and Description of Pain Patient Has Paino No Site Locations Pain Management and Medication Current Pain Management: Electronic Signature(s) Signed: 12/26/2021 11:41:41 AM By: Massie Kluver Signed: 12/27/2021 9:44:45 AM By: Carlene Coria RN Entered By: Massie Kluver on 12/24/2021 11:27:53 [name] ([id number]) -------------------------------------------------------------------------------- Patient/Caregiver Education Details Patient Name: [name] Date of Service: 12/24/2021 11:15 AM Medical Record Number: [id number] Patient Account Number: [id number] Date of Birth/Gender: 07/07/1965 (56 y.o. M) Treating RN: Carlene Coria Primary Care Physician: [name] Other Clinician: Massie Kluver Referring Physician: [name] Treating Physician/Extender: Skipper Cliche in Treatment: 10 Education Assessment Education Provided To: Patient Education Topics Provided Wound/Skin Impairment: Handouts: Other: continue wound care as directed Methods: Explain/Verbal Responses: State content correctly Electronic Signature(s) Signed: 12/26/2021 11:41:41 AM By: Massie Kluver Entered By: Massie Kluver on 12/24/2021 12:24:40 [name] ([id number]) -------------------------------------------------------------------------------- Wound Assessment Details Patient Name: [name] Date of Service: 12/24/2021 11:15 AM Medical Record Number: [id number] Patient Account Number: [id number] Date of Birth/Sex: [birth year] (56 y.o. M) Treating RN: Carlene Coria Primary Care Laura-Lee Villegas: [name] Other Clinician: Massie Kluver Referring Jaliyah Fotheringham: [name]  Treating [place] Gamero/Extender: Jeri Cos Weeks in Treatment: 10 Wound Status Wound Number: 7R Primary Pressure Ulcer Etiology: Wound Location: Distal, Medial Sacrum Wound Open Wounding Event: Pressure Injury Status: Date Acquired:  10/01/2021 Comorbid Hypotension, Myocardial Infarction, History of pressure Weeks Of Treatment: 10 History: wounds, Rheumatoid Arthritis, Paraplegia, Confinement Clustered Wound: No Anxiety Wound Measurements Length: (cm) 0.9 Width: (cm) 2.3 Depth: (cm) 0.1 Area: (cm) 1.626 Volume: (cm) 0.163 % Reduction in Area: 82.7% % Reduction in Volume: 95.7% Tunneling: No Undermining: No Wound Description Classification: Category/Stage III Exudate Amount: Medium Exudate Type: Serosanguineous Exudate Color: red, brown Foul Odor After Cleansing: No Slough/Fibrino No Wound Bed Granulation Amount: Small (1-33%) Exposed Structure Granulation Quality: Red Fascia Exposed: No Fat Layer (Subcutaneous Tissue) Exposed: Yes Tendon Exposed: No Muscle Exposed: No Joint Exposed: No Bone Exposed: No Treatment Notes Wound #7R (Sacrum) Wound Laterality: Medial, Distal Cleanser Soap and Water Discharge Instruction: Gently cleanse wound with antibacterial soap, rinse and pat dry prior to dressing wounds Peri-Wound Care Topical Primary Dressing Hydrofera Blue Ready Transfer Foam, 4x5 (in/in) Discharge Instruction: Apply Hydrofera Blue Ready to wound bed as directed Secondary Dressing (BORDER) Zetuvit Plus SILICONE BORDER Dressing 5x5 (in/in) Discharge Instruction: Please do not put silicone bordered dressings under wraps. Use non-bordered dressing only. Secured With Compression Wrap Compression Stockings [name], Colin B. ([id number]) Add-Ons Electronic Signature(s) Signed: 12/26/2021 11:41:41 AM By: Massie Kluver Signed: 12/27/2021 9:44:45 AM By: Carlene Coria RN Entered By: Massie Kluver on 12/24/2021 12:04:09 [name] ([id number]) -------------------------------------------------------------------------------- Wound Assessment Details Patient Name: [name] Date of Service: 12/24/2021 11:15 AM Medical Record Number: [id number] Patient Account Number:  [id number] Date of Birth/Sex: 06/13/1965 (56 y.o. M) Treating RN: Carlene Coria Primary Care Keniya Schlotterbeck: [name] Other Clinician: Massie Kluver Referring Wandalee Klang: [name] Treating Cymone Yeske/Extender: Skipper Cliche in Treatment: 10 Wound Status Wound Number: 8 Primary Pressure Ulcer Etiology: Wound Location: Left Gluteus Wound Open Wounding Event: Pressure Injury Status: Date Acquired: 09/05/2021 Comorbid Hypotension, Myocardial Infarction, History of pressure Weeks Of Treatment: 10 History: wounds, Rheumatoid Arthritis, Paraplegia, Confinement Clustered Wound: No Anxiety Photos Wound Measurements Length: (cm) 2.5 Width: (cm) 5 Depth: (cm) 0.4 Area: (cm) 9.817 Volume: (cm) 3.927 % Reduction in Area: -78.6% % Reduction in Volume: 28.6% Epithelialization: Small (1-33%) Tunneling: No Undermining: No Wound Description Classification: Category/Stage III Exudate Amount: Medium Exudate Type: Serosanguineous Exudate Color: red, brown Foul Odor After Cleansing: No Slough/Fibrino No Wound Bed Granulation Amount: Small (1-33%) Exposed Structure Granulation Quality: Red, Pink Fascia Exposed: No Necrotic Amount: Small (1-33%) Fat Layer (Subcutaneous Tissue) Exposed: Yes Necrotic Quality: Adherent Slough Tendon Exposed: No Muscle Exposed: No Joint Exposed: No Bone Exposed: No Treatment Notes Wound #8 (Gluteus) Wound Laterality: Left Cleanser Soap and Water Discharge Instruction: Gently cleanse wound with antibacterial soap, rinse and pat dry prior to dressing wounds Peri-Wound Care [name], Colin B. ([id number]) Topical Primary Dressing Hydrofera Blue Ready Transfer Foam, 4x5 (in/in) Discharge Instruction: Apply Hydrofera Blue Ready to wound bed as directed Secondary Dressing (BORDER) Zetuvit Plus SILICONE BORDER Dressing 5x5 (in/in) Discharge Instruction: Please do not put silicone bordered dressings under wraps. Use non-bordered dressing only. Secured  With Compression Wrap Compression Stockings Add-Ons Electronic Signature(s) Signed: 12/26/2021 11:41:41 AM By: Massie Kluver Signed: 12/27/2021 9:44:45 AM By: Carlene Coria RN Entered By: Massie Kluver on 12/24/2021 11:47:48 [name] ([id number]) -------------------------------------------------------------------------------- Wound Assessment Details Patient Name: [name] Date of Service: 12/24/2021 11:15 AM Medical Record Number: [id number] Patient Account Number: [id number] Date of Birth/Sex: 1965/05/16 (56 y.o. M) Treating  RN: Carlene Coria Primary Care Lyrica Mcclarty: [name] Other Clinician: Massie Kluver Referring Cana Mignano: [name] Treating Teagen Bucio/Extender: Skipper Cliche in Treatment: 10 Wound Status Wound Number: 9 Primary Etiology: Pressure Ulcer Wound Location: Left, Distal Gluteus Secondary Etiology: MASD Wounding Event: Gradually Appeared Wound Status: Healed - Epithelialized Date Acquired: 11/29/2021 Weeks Of Treatment: 3 Clustered Wound: No Wound Measurements Length: (cm) 0 Width: (cm) 0 Depth: (cm) 0 Area: (cm) Volume: (cm) % Reduction in Area: 100% % Reduction in Volume: 100% 0 0 Wound Description Classification: Category/Stage II Exudate Amount: Medium Exudate Type: Serous Exudate Color: amber Treatment Notes Wound #9 (Gluteus) Wound Laterality: Left, Distal Cleanser Peri-Wound Care Topical Primary Dressing Secondary Dressing Secured With Compression Wrap Compression Stockings Add-Ons Electronic Signature(s) Signed: 12/26/2021 11:41:41 AM By: Massie Kluver Signed: 12/27/2021 9:44:45 AM By: Carlene Coria RN Entered By: Massie Kluver on 12/24/2021 11:59:12 [name] ([id number]) -------------------------------------------------------------------------------- Vitals Details Patient Name: [name] Date of Service: 12/24/2021 11:15 AM Medical Record Number: [id number] Patient Account  Number: [id number] Date of Birth/Sex: [birth year] (56 y.o. M) Treating RN: Carlene Coria Primary Care Aurelio Mccamy: [name] Other Clinician: Massie Kluver Referring Djuana Littleton: [name] Treating Tonnie Stillman/Extender: Skipper Cliche in Treatment: 10 Vital Signs Time Taken: 11:25 Temperature (F): 97.8 Height (in): 73 Pulse (bpm): 73 Weight (lbs): 154 Respiratory Rate (breaths/min): 16 Body Mass Index (BMI): 20.3 Blood Pressure (mmHg): 146/89 Reference Range: 80 - 120 mg / dl Electronic Signature(s) Signed: 12/26/2021 11:41:41 AM By: Massie Kluver Entered By: Massie Kluver on 12/24/2021 11:27:49

## 2021-12-24 NOTE — Progress Notes (Signed)
VASIL, JUHASZ ([id number]) Visit Report for 12/24/2021 Chief Complaint Document Details Patient Name: [name], [name] Date of Service: 12/24/2021 11:15 AM Medical Record Number: [id number] Patient Account Number: [id number] Date of Birth/Sex: 09/11/1965 (56 y.o. M) Treating RN: Carlene Coria Primary Care Provider: [name] Other Clinician: Massie Kluver Referring Provider: [name] Treating Provider/Extender: Skipper Cliche in Treatment: 10 Information Obtained from: Patient Chief Complaint Sacral and back pressure ulcers Electronic Signature(s) Signed: 12/24/2021 11:16:35 AM By: Worthy Keeler PA-C Entered By: Worthy Keeler on 12/24/2021 11:16:35 [name] ([id number]) -------------------------------------------------------------------------------- Problem List Details Patient Name: [name] Date of Service: 12/24/2021 11:15 AM Medical Record Number: [id number] Patient Account Number: [id number] Date of Birth/Sex: 1965/09/05 (56 y.o. 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

## 2021-12-25 ENCOUNTER — Emergency Department (HOSPITAL_COMMUNITY)
Admission: EM | Admit: 2021-12-25 | Discharge: 2021-12-25 | Disposition: A | Discharge status: Home / Self Care | Payer: 59 | Attending: Emergency Medicine | Admitting: Emergency Medicine

## 2021-12-25 ENCOUNTER — Encounter (HOSPITAL_COMMUNITY): Payer: Self-pay | Admitting: *Deleted

## 2021-12-25 ENCOUNTER — Other Ambulatory Visit: Payer: Self-pay

## 2021-12-25 DIAGNOSIS — T83098A Other mechanical complication of other indwelling urethral catheter, initial encounter: Secondary | ICD-10-CM | POA: Insufficient documentation

## 2021-12-25 DIAGNOSIS — N390 Urinary tract infection, site not specified: Secondary | ICD-10-CM | POA: Diagnosis not present

## 2021-12-25 DIAGNOSIS — T839XXA Unspecified complication of genitourinary prosthetic device, implant and graft, initial encounter: Secondary | ICD-10-CM

## 2021-12-25 LAB — URINALYSIS, ROUTINE W REFLEX MICROSCOPIC
Bilirubin Urine: NEGATIVE
Glucose, UA: NEGATIVE mg/dL
Ketones, ur: NEGATIVE mg/dL
Nitrite: POSITIVE — AB
Protein, ur: 100 mg/dL — AB
Specific Gravity, Urine: 1.023 (ref 1.005–1.030)
WBC, UA: >50 WBC/hpf — ABNORMAL HIGH (ref 0–5)
pH: 5 (ref 5.0–8.0)

## 2021-12-25 MED ORDER — SULFAMETHOXAZOLE-TRIMETHOPRIM 800-160 MG PO TABS: 1.0000 | ORAL_TABLET | Freq: Two times a day (BID) | ORAL | 0 refills | Status: AC

## 2021-12-25 MED ORDER — SULFAMETHOXAZOLE-TRIMETHOPRIM 800-160 MG PO TABS
1.0000 | ORAL_TABLET | Freq: Once | ORAL | Status: AC
  Administered 2021-12-25: 1 via ORAL
  Filled 2021-12-25: qty 1
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
## 2021-12-25 NOTE — ED Triage Notes (Signed)
Pt states he is 2 days overdue to have his foley catheter changed and he is sweating more than usual. Pt states he sweats when he has an infection and thinks he has a UTI

## 2021-12-25 NOTE — Discharge Instructions (Signed)
Take the entire course of the antibiotics prescribed.  Plan follow-up care with your primary doctor as needed, return here if you develop fevers, vomiting or any new or worsening symptoms.

## 2021-12-26 LAB — URINE CULTURE: Special Requests: NORMAL

## 2022-01-14 ENCOUNTER — Ambulatory Visit: Payer: 59 | Admitting: Physician Assistant

## 2022-01-20 ENCOUNTER — Ambulatory Visit: Payer: 59 | Admitting: Physician Assistant

## 2022-01-27 ENCOUNTER — Encounter: Payer: Medicare Other | Attending: Physician Assistant | Admitting: Physician Assistant

## 2022-01-27 DIAGNOSIS — M069 Rheumatoid arthritis, unspecified: Secondary | ICD-10-CM | POA: Diagnosis not present

## 2022-01-27 DIAGNOSIS — L89153 Pressure ulcer of sacral region, stage 3: Secondary | ICD-10-CM | POA: Insufficient documentation

## 2022-01-27 DIAGNOSIS — E46 Unspecified protein-calorie malnutrition: Secondary | ICD-10-CM | POA: Diagnosis not present

## 2022-01-27 DIAGNOSIS — Z931 Gastrostomy status: Secondary | ICD-10-CM | POA: Insufficient documentation

## 2022-01-27 DIAGNOSIS — L89893 Pressure ulcer of other site, stage 3: Secondary | ICD-10-CM | POA: Insufficient documentation

## 2022-01-27 DIAGNOSIS — G8254 Quadriplegia, C5-C7 incomplete: Secondary | ICD-10-CM | POA: Diagnosis not present

## 2022-01-27 NOTE — Progress Notes (Addendum)
[name], [name] ([id number]) [id number] Page 1 of 10 Visit Report for 01/27/2022 Arrival Information Details Patient Name: Date of Service: [name], [name] 01/27/2022 3:15 PM Medical Record Number: [id number] Patient Account Number: [id number] Date of Birth/Sex: Treating RN: Jun 10, 1965 (56 y.o. [name] Primary Care Edenilson Austad: [name] Other Clinician: Referring Forrest Jaroszewski: Treating Fardeen Steinberger/Extender: [name] in Treatment: 14 Visit Information History Since Last Visit Added or deleted any medications: No Patient Arrived: Wheel Chair Any new allergies or adverse reactions: No Arrival Time: 15:11 Hospitalized since last visit: No Accompanied By: self Pain Present Now: No Transfer Assistance: Civil Service fast streamer Patient Identification Verified: Yes Secondary Verification Process Completed: Yes Patient Requires Transmission-Based Precautions: No Patient Has Alerts: No Electronic Signature(s) Signed: 01/27/2022 4:52:26 PM By: Rosalio Loud MSN RN CNS WTA Entered By: Rosalio Loud on 01/27/2022 15:13:43 -------------------------------------------------------------------------------- Clinic Level of Care Assessment Details Patient Name: Date of Service: [name], [name] 01/27/2022 3:15 PM Medical Record Number: [id number] Patient Account Number: [id number] Date of Birth/Sex: Treating RN: 11-16-1965 (56 y.o. [name] Primary Care Chazz Philson: [name] Other Clinician: Referring Soma Bachand: Treating Lauralei Clouse/Extender: [name] in Treatment: 14 Clinic Level of Care Assessment Items TOOL 4 Quantity Score X- 1 0 Use when only an EandM is performed on FOLLOW-UP visit ASSESSMENTS - Nursing Assessment / Reassessment X- 1 10 Reassessment of Co-morbidities (includes updates in patient status) X- 1 5 Reassessment of Adherence to Treatment Plan ASSESSMENTS - Wound and Skin A ssessment / Reassessment X  - Simple Wound Assessment / Reassessment - one wound 1 5 '[]'$  - 0 Complex Wound Assessment / Reassessment - multiple wounds [name] ([id number]) [id number] Page 2 of 10 '[]'$  - 0 Dermatologic / Skin Assessment (not related to wound area) ASSESSMENTS - Focused Assessment '[]'$  - 0 Circumferential Edema Measurements - multi extremities '[]'$  - 0 Nutritional Assessment / Counseling / Intervention '[]'$  - 0 Lower Extremity Assessment (monofilament, tuning fork, pulses) '[]'$  - 0 Peripheral Arterial Disease Assessment (using hand held doppler) ASSESSMENTS - Ostomy and/or Continence Assessment and Care '[]'$  - 0 Incontinence Assessment and Management '[]'$  - 0 Ostomy Care Assessment and Management (repouching, etc.) PROCESS - Coordination of Care X - Simple Patient / Family Education for ongoing care 1 15 '[]'$  - 0 Complex (extensive) Patient / Family Education for ongoing care '[]'$  - 0 Staff obtains Programmer, systems, Records, T Results / Process Orders est '[]'$  - 0 Staff telephones HHA, Nursing Homes / Clarify orders / etc '[]'$  - 0 Routine Transfer to another Facility (non-emergent condition) '[]'$  - 0 Routine Hospital Admission (non-emergent condition) '[]'$  - 0 New Admissions / Biomedical engineer / Ordering NPWT Apligraf, etc. , '[]'$  - 0 Emergency Hospital Admission (emergent condition) X- 1 10 Simple Discharge Coordination '[]'$  - 0 Complex (extensive) Discharge Coordination PROCESS - Special Needs '[]'$  - 0 Pediatric / Minor Patient Management '[]'$  - 0 Isolation Patient Management '[]'$  - 0 Hearing / Language / Visual special needs '[]'$  - 0 Assessment of Community assistance (transportation, D/C planning, etc.) '[]'$  - 0 Additional assistance / Altered mentation '[]'$  - 0 Support Surface(s) Assessment (bed, cushion, seat, etc.) INTERVENTIONS - Wound Cleansing / Measurement X - Simple Wound Cleansing - one wound 1 5 '[]'$  - 0 Complex Wound Cleansing - multiple wounds X- 1 5 Wound  Imaging (photographs - any number of wounds) '[]'$  - 0 Wound Tracing (instead of photographs) X- 1 5 Simple Wound Measurement - one wound '[]'$  - 0 Complex Wound  Measurement - multiple wounds INTERVENTIONS - Wound Dressings X - Small Wound Dressing one or multiple wounds 1 10 '[]'$  - 0 Medium Wound Dressing one or multiple wounds '[]'$  - 0 Large Wound Dressing one or multiple wounds '[]'$  - 0 Application of Medications - topical '[]'$  - 0 Application of Medications - injection INTERVENTIONS - Miscellaneous '[]'$  - 0 External ear exam '[]'$  - 0 Specimen Collection (cultures, biopsies, blood, body fluids, etc.) '[]'$  - 0 Specimen(s) / Culture(s) sent or taken to Lab for analysis [name] ([id number]) [id number] Page 3 of 10 '[]'$  - 0 Patient Transfer (multiple staff / Civil Service fast streamer / Similar devices) '[]'$  - 0 Simple Staple / Suture removal (25 or less) '[]'$  - 0 Complex Staple / Suture removal (26 or more) '[]'$  - 0 Hypo / Hyperglycemic Management (close monitor of Blood Glucose) '[]'$  - 0 Ankle / Brachial Index (ABI) - do not check if billed separately X- 1 5 Vital Signs Has the patient been seen at the hospital within the last three years: Yes Total Score: 75 Level Of Care: New/Established - Level 2 Electronic Signature(s) Signed: 01/28/2022 5:02:00 PM By: Rosalio Loud MSN RN CNS WTA Entered By: Rosalio Loud on 01/27/2022 17:00:26 -------------------------------------------------------------------------------- Encounter Discharge Information Details Patient Name: Date of Service: [name], [name]. 01/27/2022 3:15 PM Medical Record Number: [id number] Patient Account Number: [id number] Date of Birth/Sex: Treating RN: [birth year] (56 y.o. [name] Primary Care Kynesha Guerin: [name] Other Clinician: Referring Day Greb: Treating Christella App/Extender: [name] in Treatment: 14 Encounter Discharge Information Items Discharge Condition:  Stable Ambulatory Status: Wheelchair Discharge Destination: Home Transportation: Private Auto Accompanied By: self Schedule Follow-up Appointment: Yes Clinical Summary of Care: Electronic Signature(s) Signed: 01/27/2022 5:02:18 PM By: Rosalio Loud MSN RN CNS WTA Entered By: Rosalio Loud on 01/27/2022 17:02:18 -------------------------------------------------------------------------------- Lower Extremity Assessment Details Patient Name: Date of Service: DONTEA, CORLEW 01/27/2022 3:15 PM Medical Record Number: [id number] Patient Account Number: [id number] Date of Birth/Sex: Treating RN: 1966/01/11 (56 y.o. [name] Primary Care Nicha Hemann: [name] Other Clinician: Referring Alandria Butkiewicz: Treating Daanish Copes/Extender: Genelle Bal, Dawud [name] ([id number]) [id number] Page 4 of 10 Weeks in Treatment: 14 Electronic Signature(s) Signed: 01/27/2022 4:52:26 PM By: Rosalio Loud MSN RN CNS WTA Entered By: Rosalio Loud on 01/27/2022 15:31:45 -------------------------------------------------------------------------------- Multi Wound Chart Details Patient Name: Date of Service: [name], [place] [name]. 01/27/2022 3:15 PM Medical Record Number: [id number] Patient Account Number: [id number] Date of Birth/Sex: Treating RN: February 17, 1966 (56 y.o. [name] Primary Care Najae Filsaime: [name] Other Clinician: Referring Astra [name]: Treating Chassidy Layson/Extender: [name] in Treatment: 14 Vital Signs Height(in): 73 Pulse(bpm): 83 Weight(lbs): 154 Blood Pressure(mmHg): 122/84 Body Mass Index(BMI): 20.3 Temperature(F): 98.6 Respiratory Rate(breaths/min): 16 [7R:Photos: No Photos] [N/A:N/A] Distal, Medial Sacrum Left Gluteus N/A Wound Location: Pressure Injury Pressure Injury N/A Wounding Event: Pressure Ulcer Pressure Ulcer N/A Primary Etiology: N/A Hypotension, Myocardial Infarction, N/A Comorbid  History: History of pressure wounds, Rheumatoid Arthritis, Paraplegia, Confinement Anxiety 10/01/2021 09/05/2021 N/A Date Acquired: 14 14 N/A Weeks of Treatment: Healed - Epithelialized Open N/A Wound Status: Yes No N/A Wound Recurrence: 0x0x0 2x1.8x0.9 N/A Measurements L x W x D (cm) 0 2.827 N/A A (cm) : rea 0 2.545 N/A Volume (cm) : 100.00% 48.60% N/A % Reduction in A rea: 100.00% 53.70% N/A % Reduction in Volume: 8 Starting Position 1 (o'clock): 11 Ending Position 1 (o'clock): 1.5 Maximum Distance 1 (cm): N/A Yes N/A Undermining: Category/Stage III Category/Stage III  N/A Classification: Medium Medium N/A Exudate A mount: Serosanguineous Serosanguineous N/A Exudate Type: red, brown red, brown N/A Exudate Color: N/A Well defined, not attached N/A Wound Margin: N/A Small (1-33%) N/A Granulation A mount: N/A Red, Pink N/A Granulation Quality: N/A Small (1-33%) N/A Necrotic A mount: N/A Small (1-33%) N/A EpithelializationLanny [name], [name] ([id number]) [id number] Page 5 of 10 Treatment Notes Electronic Signature(s) Signed: 01/27/2022 4:52:26 PM By: Rosalio Loud MSN RN CNS WTA Entered By: Rosalio Loud on 01/27/2022 15:55:42 -------------------------------------------------------------------------------- Multi-Disciplinary Care Plan Details Patient Name: Date of Service: [name], [place] [name]. 01/27/2022 3:15 PM Medical Record Number: [id number] Patient Account Number: [id number] Date of Birth/Sex: Treating RN: 16-Jun-1965 (56 y.o. [name] Primary Care Derreon Consalvo: [name] Other Clinician: Referring Lorree Millar: Treating Cougar Imel/Extender: [name] in Treatment: 14 Active Inactive Pressure Nursing Diagnoses: Knowledge deficit related to causes and risk factors for pressure ulcer development Knowledge deficit related to management of pressures ulcers Potential for impaired tissue integrity related  to pressure, friction, moisture, and shear Goals: Patient will remain free from development of additional pressure ulcers Date Initiated: 12/03/2021 Target Resolution Date: 12/03/2021 Goal Status: Active Patient/caregiver will verbalize risk factors for pressure ulcer development Date Initiated: 12/03/2021 Target Resolution Date: 12/03/2021 Goal Status: Active Patient/caregiver will verbalize understanding of pressure ulcer management Date Initiated: 12/03/2021 Target Resolution Date: 12/03/2021 Goal Status: Active Interventions: Assess: immobility, friction, shearing, incontinence upon admission and as needed Assess offloading mechanisms upon admission and as needed Assess potential for pressure ulcer upon admission and as needed Provide education on pressure ulcers Notes: Wound/Skin Impairment Nursing Diagnoses: Impaired tissue integrity Knowledge deficit related to ulceration/compromised skin integrity Goals: Ulcer/skin breakdown will have a volume reduction of 30% by week 4 Date Initiated: 10/15/2021 Target Resolution Date: 11/12/2021 Goal Status: Active Ulcer/skin breakdown will have a volume reduction of 50% by week 8 Date Initiated: 10/15/2021 Target Resolution Date: 12/10/2021 Goal Status: Active Ulcer/skin breakdown will have a volume reduction of 80% by week 12 Date Initiated: 10/15/2021 Target Resolution Date: 01/07/2022 Goal Status: Active [name], [name] ([id number]) [id number].pdf Page 6 of 10 Ulcer/skin breakdown will heal within 14 weeks Date Initiated: 10/15/2021 Target Resolution Date: 01/21/2022 Goal Status: Active Interventions: Assess patient/caregiver ability to obtain necessary supplies Assess patient/caregiver ability to perform ulcer/skin care regimen upon admission and as needed Assess ulceration(s) every visit Provide education on ulcer and skin care Notes: Electronic Signature(s) Signed: 01/27/2022 4:52:26 PM By: Rosalio Loud  MSN RN CNS WTA Entered By: Rosalio Loud on 01/27/2022 15:55:36 -------------------------------------------------------------------------------- Pain Assessment Details Patient Name: Date of Service: [name], Laronn [name]. 01/27/2022 3:15 PM Medical Record Number: [id number] Patient Account Number: [id number] Date of Birth/Sex: Treating RN: [birth year] (56 y.o. [name] Primary Care Cray Monnin: [name] Other Clinician: Referring Edinson Domeier: Treating Rosetta Rupnow/Extender: [name] in Treatment: 14 Active Problems Location of Pain Severity and Description of Pain Patient Has Paino No Site Locations Pain Management and Medication Current Pain Management: Electronic Signature(s) Signed: 01/27/2022 4:52:26 PM By: Rosalio Loud MSN RN CNS WTA Entered By: Rosalio Loud on 01/27/2022 15:22:29 [name] ([id number]) [id number] Page 7 of 10 -------------------------------------------------------------------------------- Patient/Caregiver Education Details Patient Name: Date of Service: [name], [name] 10/23/2023andnbsp3:15 PM Medical Record Number: [id number] Patient Account Number: [id number] Date of Birth/Gender: Treating RN: 1965-07-01 (56 y.o. [name] Primary Care Physician: [name] Other Clinician: Referring Physician: Treating Physician/Extender: [name] in Treatment: 14 Education Assessment Education Provided To: Patient Education  Topics Provided Pressure: Handouts: Pressure Ulcers: Care and Offloading Methods: Explain/Verbal Responses: State content correctly Wound/Skin Impairment: Handouts: Caring for Your Ulcer Methods: Explain/Verbal Responses: State content correctly Electronic Signature(s) Signed: 01/28/2022 5:02:00 PM By: Rosalio Loud MSN RN CNS WTA Entered By: Rosalio Loud on 01/27/2022  17:01:20 -------------------------------------------------------------------------------- Wound Assessment Details Patient Name: Date of Service: [name], [name]. 01/27/2022 3:15 PM Medical Record Number: [id number] Patient Account Number: [id number] Date of Birth/Sex: Treating RN: 25-Sep-1965 (56 y.o. [name] Primary Care Syris Brookens: [name] Other Clinician: Referring Malyssa Maris: Treating Malic Rosten/Extender: [name] in Treatment: 14 Wound Status Wound Number: [id number] Primary Etiology: Pressure Ulcer Wound Location: Distal, Medial Sacrum Wound Status: Healed - Epithelialized Wounding Event: Pressure Injury Date Acquired: 10/01/2021 Weeks Of Treatment: 14 Clustered Wound: No Lozito, [name] ([id number]) [id number] Page 8 of 10 Wound Measurements Length: (cm) Width: (cm) Depth: (cm) Area: (cm) Volume: (cm) 0 % Reduction in Area: 100% 0 % Reduction in Volume: 100% 0 0 0 Wound Description Classification: Category/Stage III Exudate Amount: Medium Exudate Type: Serosanguineous Exudate Color: red, brown Treatment Notes Wound #7R (Sacrum) Wound Laterality: Medial, Distal Cleanser Peri-Wound Care Topical Primary Dressing Secondary Dressing Secured With Compression Wrap Compression Stockings Add-Ons Electronic Signature(s) Signed: 01/27/2022 4:52:26 PM By: Rosalio Loud MSN RN CNS WTA Entered By: Rosalio Loud on 01/27/2022 15:30:05 -------------------------------------------------------------------------------- Wound Assessment Details Patient Name: Date of Service: [name], [name]. 01/27/2022 3:15 PM Medical Record Number: [id number] Patient Account Number: [id number] Date of Birth/Sex: Treating RN: 03-29-1966 (56 y.o. [name] Primary Care Marvelene Stoneberg: [name] Other Clinician: Referring Brennon Otterness: Treating Amiayah Giebel/Extender: [name] in Treatment: 14 Wound Status Wound  Number: 8 Primary Pressure Ulcer Etiology: Wound Location: Left Gluteus Wound Open Wounding Event: Pressure Injury Status: Date Acquired: 09/05/2021 Comorbid Hypotension, Myocardial Infarction, History of pressure wounds, Weeks Of Treatment: 14 History: Rheumatoid Arthritis, Paraplegia, Confinement Anxiety Clustered Wound: No Photos CRAVENS, [name] ([id number]) [id number] Page 9 of 10 Wound Measurements Length: (cm) 2 Width: (cm) 1.8 Depth: (cm) 0.9 Area: (cm) 2.827 Volume: (cm) 2.545 % Reduction in Area: 48.6% % Reduction in Volume: 53.7% Epithelialization: Small (1-33%) Tunneling: No Undermining: Yes Starting Position (o'clock): 8 Ending Position (o'clock): 11 Maximum Distance: (cm) 1.5 Wound Description Classification: Category/Stage III Wound Margin: Well defined, not attached Exudate Amount: Medium Exudate Type: Serosanguineous Exudate Color: red, brown Foul Odor After Cleansing: No Slough/Fibrino No Wound Bed Granulation Amount: Small (1-33%) Exposed Structure Granulation Quality: Red, Pink Fascia Exposed: No Necrotic Amount: Small (1-33%) Fat Layer (Subcutaneous Tissue) Exposed: Yes Necrotic Quality: Adherent Slough Tendon Exposed: No Muscle Exposed: No Joint Exposed: No Bone Exposed: No Treatment Notes Wound #8 (Gluteus) Wound Laterality: Left Cleanser Soap and Water Discharge Instruction: Gently cleanse wound with antibacterial soap, rinse and pat dry prior to dressing wounds Peri-Wound Care Topical Primary Dressing Silvercel Small 2x2 (in/in) Discharge Instruction: Apply Silvercel Small 2x2 (in/in) as instructed Secondary Dressing (BORDER) Zetuvit Plus SILICONE BORDER Dressing 5x5 (in/in) Discharge Instruction: Please do not put silicone bordered dressings under wraps. Use non-bordered dressing only. Secured With Compression Wrap Compression Stockings Environmental education officer) Signed: 01/27/2022 4:52:26 PM  By: Rosalio Loud MSN RN CNS WTA Entered By: Rosalio Loud on 01/27/2022 15:35:54 [name] ([id number]) [id number] Page 10 of 10 -------------------------------------------------------------------------------- Vitals Details Patient Name: Date of Service: [name], [name] 01/27/2022 3:15 PM Medical Record Number: [id number] Patient Account Number: [id number] Date of Birth/Sex: Treating RN: 11-20-1965 (56 y.o. [name]  Primary Care Kyesha Balla: [name] Other Clinician: Referring Alfonso Carden: Treating Retaj Hilbun/Extender: [name] in Treatment: 14 Vital Signs Time Taken: 03:13 Temperature (F): 98.6 Height (in): 73 Pulse (bpm): 90 Weight (lbs): 154 Respiratory Rate (breaths/min): 16 Body Mass Index (BMI): 20.3 Blood Pressure (mmHg): 122/84 Reference Range: 80 - 120 mg / dl Electronic Signature(s) Signed: 01/27/2022 4:52:26 PM By: Rosalio Loud MSN RN CNS WTA Entered By: Rosalio Loud on 01/27/2022 15:14:14

## 2022-01-27 NOTE — Progress Notes (Signed)
ARAGORN, RECKER [name] ([id number]) [id number] Page 1 of 8 Visit Report for 01/27/2022 Chief Complaint Document Details Patient Name: Date of Service: [name], [name] 01/27/2022 3:15 PM Medical Record Number: [id number] Patient Account Number: [id number] Date of Birth/Sex: Treating RN: [birth year] (56 y.o. [name] Primary Care Provider: [name] Other Clinician: Referring Provider: Treating Provider/Extender: [name] in Treatment: 14 Information Obtained from: Patient Chief Complaint Sacral and back pressure ulcers Electronic Signature(s) Signed: 01/27/2022 3:32:58 PM By: Worthy Keeler PA-C Entered By: Worthy Keeler on 01/27/2022 15:32:58 -------------------------------------------------------------------------------- HPI Details Patient Name: Date of Service: [name], [name]. 01/27/2022 3:15 PM Medical Record Number: [id number] Patient Account Number: [id number] Date of Birth/Sex: Treating RN: [birth year] (56 y.o. [name] Primary Care Provider: [name] Other Clinician: Referring Provider: Treating Provider/Extender: [name] in Treatment: 14 History of Present Illness ssociated Signs and Symptoms: Patient has a history of confirmed osteomyelitis of the left foot according to MRI July 2019. 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 HPI Description: 12/08/17 on evaluation today patient presents for initial evaluation and our clinic concerning issues he has been having with his feet bilaterally although the left more than right most recently. He was admitted to hospital where he was placed on IV vancomycin which actually he continue to utilize until discharge and even when discharged continue to be on until around 16 August according to what he tells me. His PICC line was removed at that point. Nonetheless he has  been seen in the wound care center in [facility] before transferring to [place] due to the fact that the doctor there left and they are no longer open. Nonetheless he does have significant osteomyelitis of the left foot noted on MRI which revealed significant issues with necrotic bone including a portion of the distal region of his left great toe which was felt to possibly either be missing as a result of amputation or potentially resorption. Either way he also had osteomyelitis noted of the digit, metatarsal region, cuneiform, and navicular bones. There is definite bone exposure noted externally at this point in time. In regard to the right foot he has issues with ulcerations here as well although he states that recently he's had no issues until this just blistered and reopened. Apparently Xeroform was used in this has caused things to be somewhat more moist and open more according to the patient. He's otherwise been tolerating the dressing changes without complication using silver alginate dressings. He has no discomfort but does seem to be extremely stressed regarding the fact that he did see Dr. 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 The patient stated per notes reviewed that he did not want to have any surgery and therefore has a repeat appointment scheduled with the surgeon on 12/15/17. With all that being said he feels like that he really has not been alerted to what was going on in the severity that was until just recently and has a lot of questions in that regard. I'll be see I explained that seeing him for the first time today I cannot answer a lot of those questions that he has. [name], [name] ([id number]) [id number] Page 2 of 8 12/21/17 on evaluation today patient actually appears to be doing a little better in regard to the right foot in particular. There is one area where there  still definitive bone noted although the  MRI which I did review today that we had ordered was negative for any evidence of osteomyelitis which is good news. Nonetheless there was some necrotic bone noted. Overall the patient appears to be doing fairly well however in regard to the right foot. Unfortunately he does have a new open area on the posterior aspect of his left leg as well as the continued area of necrosis noted in the central portion of the foot. He states that he's actually going to be sent for reevaluation specifically to a limb salvage clinic at [facility] he believes this is something that is primary care provider has been working with. They were just awaiting the results of the MRI. 01/08/18 on evaluation today patient actually appears to be doing maybe just a little better in regard to the overall appearance of his bilateral foot ulcers. In general this does not seem to have worsened at least which is good news. He still has evidence of bone noted on the surface of both wound areas which though dry appearing really has not otherwise changed at this time. He actually has an appointment at [facility] with the wound center to discuss limb salvage. He discusses with me at the last visit. Nonetheless he was somewhat upset today about a couple things one being that he stated that we did not put the order in for the protective dressing for his knees that we discussed last time. With that being said I had put in the order for the protective dressing in fact it was on his order sheet that was signed on 12/21/17. Nonetheless unfortunately it appears this was overlooked by home health and they told him if they had the order they could put the protective dressing on. Nonetheless they told him they did not have the order. Again I believe this was an oversight nothing intentional on anyone's part nonetheless the order was there. Still I had explained to the patient sometimes insurance will not cover for the protective dressing even if it  is ordered and this was discussed in the last visit. 01/25/18 on evaluation today patient presents for follow-up concerning his bilateral lower extremity ulcers. He has actually been doing about the same since I last saw him. We do have them in the room we can look at his gluteal region today as well to ensure that there is nothing open or if so address the issue there. Nonetheless he was supposed to see you and see wound care/limb salvage prior to his visit with me today. Unfortunately when he arrived last time at their clinic he apparently was there on the wrong day the doctor was not even present that he was his to be seen. Nonetheless I had to be switched to a different day and the patient actually is going in the next week to see them. Assuming everything works out this should actually be tomorrow. Nonetheless if they take over care of that point then we will subsequently release care to them. Readmission: 10-15-2021 upon evaluation today patient appears to be doing somewhat poorly in regard to wounds over the left gluteal region. He also has areas in the midline sacrum although this is not quite as bad. There is however some evidence of necrotic bone noted at this point unfortunately. The question is whether this is just something working its way out or if it something that is actually actively infected at this point. We are going to have to evaluate that further  little by little as we go with additional diagnostics. With that being said the patient tells me currently that what we are seeing him for has been open since around 09-05-2021 for the gluteal area on the left and in regard to the sacrum 10-01-2021 he states. With that being said he again is not too significant as far as the overall appearance of the wounds are concerned but again with the bone exposed this is something that we may want to do at pathology and culture in regard to. He has previously been treated for osteomyelitis of the  sacral area. 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 This is a gentleman whom I have previously seen back in 2019. The wound that we were taking care of back at that time is completely cleared. 7/25; 2-week follow-up. The patient has 3 small open areas in the lower sacrum and then an area over the left ischial tuberosity. Both of these are stage III wounds. He has been using Hydrofera Blue. 11-12-2021 upon evaluation today patient appears to be doing well currently in regard to his wounds. The distal sacral area is almost completely closed and looks to be doing great. In regard to the left gluteal region this is showing signs of improvement from a depth perspective size is about the same. Overall I am extremely pleased with where things stand compared to what we were previous. 12-03-2021 upon evaluation today patient appears to be doing well currently in regard to his wound. He is actually showing signs of improvement the sacral region and the right ischial/gluteal region is completely healed. The left ischial/gluteal region is still open but seems to be doing much better. 12-24-2021 upon evaluation today patient appears to be doing well currently in regard to some of his wounds others have reopened or are not doing quite as well. Overall though I think that he is on a good track he just may be needs to be a little bit better on appropriate offloading we discussed that today as well. 01-27-2022 upon evaluation today patient appears to be doing well currently in regard to his wound. He is not showing any signs of significant tissue breakdown but unfortunately the wound is larger than what it was last time I saw him. He tells me that this seems to be a complete shock based on what he is hearing today. He tells me that his son was telling him this has been doing well and that it was smaller and not really bleeding or draining much. Again this is  definitely significantly larger than last time I saw him but does not appear to be obviously infected there is some undermining noted as well. In general I am unsure of what would have caused such a dramatic changes what he seems to feel has happened here but nonetheless indeed I do not feel like that this is doing nearly as well as last time I saw him. Electronic Signature(s) Signed: 01/27/2022 5:02:20 PM By: Worthy Keeler PA-C Entered By: Worthy Keeler on 01/27/2022 17:02:20 -------------------------------------------------------------------------------- Physical Exam Details Patient Name: Date of Service: [name], [name] 01/27/2022 3:15 PM Medical Record Number: [id number] Patient Account Number: [id number] Date of Birth/Sex: Treating RN: [birth year] (56 y.o. [name] Primary Care Provider: [name] Other Clinician: Referring Provider: Treating Provider/Extender: [name] in Treatment: 42 Constitutional Well-nourished and well-hydrated in no acute distress. [name], [name] ([id number]) [id number] Page 3 of  8 Respiratory normal breathing without difficulty. Psychiatric this patient is able to make decisions and demonstrates good insight into disease process. Alert and Oriented x 3. pleasant and cooperative. Notes Upon inspection patient's wound bed unfortunately seems to be doing a little bit worse compared to last time I saw him it is larger and he is somewhat frustrated with that which I completely understand. With that being said I do believe that he is going to require aggressive offloading to help this get back to where he wants it to be again up to this point we really feel like things were doing much better I am not sure what happened different over the past couple of weeks but he tells me that he is good to be more conscientious and stay off of this more he wants to get this thing healed. Electronic  Signature(s) Signed: 01/28/2022 6:20:53 PM By: Worthy Keeler PA-C Entered By: Worthy Keeler on 01/28/2022 18:20:53 -------------------------------------------------------------------------------- Physician Orders Details Patient Name: Date of Service: [name], [name]. 01/27/2022 3:15 PM Medical Record Number: [id number] Patient Account Number: [id number] Date of Birth/Sex: Treating RN: December 01, 1965 (56 y.o. [name] Primary Care Provider: [name] Other Clinician: Referring Provider: Treating Provider/Extender: [name] in Treatment: [phone] Verbal / Phone Orders: No Diagnosis Coding ICD-10 Coding Code Description L89.153 Pressure ulcer of sacral region, stage 3 L89.893 Pressure ulcer of other site, stage 3 G82.54 Quadriplegia, C5-C7 incomplete Z93.1 Gastrostomy status Follow-up Appointments Return Appointment in 2 weeks. Nurse Visit as needed [place]: - [place] for wound care. May utilize formulary equivalent dressing for wound treatment orders unless otherwise specified. Home Health Nurse may visit PRN to address patients wound care needs. Scheduled days for dressing changes to be completed; exception, patient has scheduled wound care visit that day. **Please direct any NON-WOUND related issues/requests for orders to patient's Primary Care Physician. **If current dressing causes regression in wound condition, may D/C ordered dressing product/s and apply Normal Saline Moist Dressing daily until next [place] or Other MD appointment. **Notify [facility] of regression in wound condition at [phone]. Bathing/ [employer] wounds with antibacterial soap and water. May shower; gently cleanse wound with antibacterial soap, rinse and pat dry prior to dressing wounds No tub bath. Off-Loading Turn and reposition every 2 hours Other: - relive pressure from heels on  bed Wound Treatment Wound #8 - Gluteus Wound Laterality: Left Cleanser: Soap and Water (Home Health) (Generic) 3 x Per Week/30 Days Discharge Instructions: Gently cleanse wound with antibacterial soap, rinse and pat dry prior to dressing wounds [name], Shyler [name] ([id number]) [id number] Page 4 of 8 Prim Dressing: Silvercel Small 2x2 (in/in) (Home Health) (Generic) 3 x Per Week/30 Days ary Discharge Instructions: Apply Silvercel Small 2x2 (in/in) as instructed Secondary Dressing: (BORDER) Zetuvit Plus SILICONE BORDER Dressing 5x5 (in/in) (Home Health) (Generic) 3 x Per Week/30 Days Discharge Instructions: Please do not put silicone bordered dressings under wraps. Use non-bordered dressing only. Electronic Signature(s) Signed: 01/27/2022 4:52:26 PM By: Rosalio Loud MSN RN CNS WTA Signed: 01/28/2022 6:28:37 PM By: Worthy Keeler PA-C Entered By: Rosalio Loud on 01/27/2022 15:56:26 -------------------------------------------------------------------------------- Problem List Details Patient Name: Date of Service: [name], Kinnelon [name]. 01/27/2022 3:15 PM Medical Record Number: [id number] Patient Account Number: [id number] Date of Birth/Sex: Treating RN: October 25, 1965 (56 y.o. [name] Primary Care Provider: [name] Other Clinician: Referring Provider: Treating Provider/Extender: [name]  in Treatment: 14 Active Problems ICD-10 Encounter Code Description Active Date MDM Diagnosis L89.153 Pressure ulcer of sacral region, stage 3 10/15/2021 No Yes L89.893 Pressure ulcer of other site, stage 3 10/15/2021 No Yes G82.54 Quadriplegia, C5-C7 incomplete 10/15/2021 No Yes Z93.1 Gastrostomy status 10/15/2021 No Yes Inactive Problems Resolved Problems Electronic Signature(s) Signed: 01/27/2022 3:32:55 PM By: Worthy Keeler PA-C Entered By: Worthy Keeler on 01/27/2022 15:32:55 [name], [name] ([id number])  [id number] Page 5 of 8 -------------------------------------------------------------------------------- Progress Note Details Patient Name: Date of Service: SAHEED, CARRINGTON 01/27/2022 3:15 PM Medical Record Number: [id number] Patient Account Number: [id number] Date of Birth/Sex: Treating RN: 1966-01-29 (56 y.o. [name] Primary Care Provider: [name] Other Clinician: Referring Provider: Treating Provider/Extender: [name] in Treatment: 14 Subjective Chief Complaint Information obtained from Patient Sacral and back pressure ulcers History of Present Illness (HPI) The following HPI elements were documented for the patient's wound: Associated Signs and Symptoms: Patient has a history of confirmed osteomyelitis of the left foot according to MRI July 2019. 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 12/08/17 on evaluation today patient presents for initial evaluation and our clinic concerning issues he has been having with his feet bilaterally although the left more than right most recently. He was admitted to hospital where he was placed on IV vancomycin which actually he continue to utilize until discharge and even when discharged continue to be on until around 16 August according to what he tells me. His PICC line was removed at that point. Nonetheless he has been seen in the wound care center in [facility] before transferring to [place] due to the fact that the doctor there left and they are no longer open. Nonetheless he does have significant osteomyelitis of the left foot noted on MRI which revealed significant issues with necrotic bone including a portion of the distal region of his left great toe which was felt to possibly either be missing as a result of amputation or potentially resorption. Either way he also had osteomyelitis noted of the digit,  metatarsal region, cuneiform, and navicular bones. There is definite bone exposure noted externally at this point in time. In regard to the right foot he has issues with ulcerations here as well although he states that recently he's had no issues until this just blistered and reopened. Apparently Xeroform was used in this has caused things to be somewhat more moist and open more according to the patient. He's otherwise been tolerating the dressing changes without complication using silver alginate dressings. He has no discomfort but does seem to be extremely stressed regarding the fact that he did see Dr. 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 The patient stated per notes reviewed that he did not want to have any surgery and therefore has a repeat appointment scheduled with the surgeon on 12/15/17. With all that being said he feels like that he really has not been alerted to what was going on in the severity that was until just recently and has a lot of questions in that regard. I'll be see I explained that seeing him for the first time today I cannot answer a lot of those questions that he has. 12/21/17 on evaluation today patient actually appears to be doing a little better in regard to the right foot in particular. There is one area where there still definitive bone noted although  the MRI which I did review today that we had ordered was negative for any evidence of osteomyelitis which is good news. Nonetheless there was some necrotic bone noted. Overall the patient appears to be doing fairly well however in regard to the right foot. Unfortunately he does have a new open area on the posterior aspect of his left leg as well as the continued area of necrosis noted in the central portion of the foot. He states that he's actually going to be sent for reevaluation specifically to a limb salvage clinic at [facility] he believes this is something that is primary  care provider has been working with. They were just awaiting the results of the MRI. 01/08/18 on evaluation today patient actually appears to be doing maybe just a little better in regard to the overall appearance of his bilateral foot ulcers. In general this does not seem to have worsened at least which is good news. He still has evidence of bone noted on the surface of both wound areas which though dry appearing really has not otherwise changed at this time. He actually has an appointment at [facility] with the wound center to discuss limb salvage. He discusses with me at the last visit. Nonetheless he was somewhat upset today about a couple things one being that he stated that we did not put the order in for the protective dressing for his knees that we discussed last time. With that being said I had put in the order for the protective dressing in fact it was on his order sheet that was signed on 12/21/17. Nonetheless unfortunately it appears this was overlooked by home health and they told him if they had the order they could put the protective dressing on. Nonetheless they told him they did not have the order. Again I believe this was an oversight nothing intentional on anyone's part nonetheless the order was there. Still I had explained to the patient sometimes insurance will not cover for the protective dressing even if it is ordered and this was discussed in the last visit. 01/25/18 on evaluation today patient presents for follow-up concerning his bilateral lower extremity ulcers. He has actually been doing about the same since I last saw him. We do have them in the room we can look at his gluteal region today as well to ensure that there is nothing open or if so address the issue there. Nonetheless he was supposed to see you and see wound care/limb salvage prior to his visit with me today. Unfortunately when he arrived last time at their clinic he apparently was there on the wrong day the doctor was  not even present that he was his to be seen. Nonetheless I had to be switched to a different day and the patient actually is going in the next week to see them. Assuming everything works out this should actually be tomorrow. Nonetheless if they take over care of that point then we will subsequently release care to them. Readmission: 10-15-2021 upon evaluation today patient appears to be doing somewhat poorly in regard to wounds over the left gluteal region. He also has areas in the midline sacrum although this is not quite as bad. There is however some evidence of necrotic bone noted at this point unfortunately. The question is whether this is just something working its way out or if it something that is actually actively infected at this point. We are going to have to evaluate that further little by little as we  go with additional diagnostics. With that being said the patient tells me currently that what we are seeing him for has been open since around 09-05-2021 for the gluteal area on the left and in regard to the sacrum 10-01-2021 he states. With that being said he again is not too significant as far as the overall appearance of the wounds are concerned but again with the bone exposed this is something that we may want to do at pathology and culture in regard to. 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 This is a gentleman whom I have previously seen back in 2019. The wound that we were taking care of back at that time is completely cleared. 7/25; 2-week follow-up. The patient has 3 small open areas in the lower sacrum and then an area over the left ischial tuberosity. Both of these are stage III wounds. He has been using Hydrofera Blue. 11-12-2021 upon evaluation today patient appears to be doing well currently in regard to his wounds. The distal sacral area is almost completely  closed and looks [name], [name] ([id number]) [id number] Page 6 of 8 to be doing great. In regard to the left gluteal region this is showing signs of improvement from a depth perspective size is about the same. Overall I am extremely pleased with where things stand compared to what we were previous. 12-03-2021 upon evaluation today patient appears to be doing well currently in regard to his wound. He is actually showing signs of improvement the sacral region and the right ischial/gluteal region is completely healed. The left ischial/gluteal region is still open but seems to be doing much better. 12-24-2021 upon evaluation today patient appears to be doing well currently in regard to some of his wounds others have reopened or are not doing quite as well. Overall though I think that he is on a good track he just may be needs to be a little bit better on appropriate offloading we discussed that today as well. 01-27-2022 upon evaluation today patient appears to be doing well currently in regard to his wound. He is not showing any signs of significant tissue breakdown but unfortunately the wound is larger than what it was last time I saw him. He tells me that this seems to be a complete shock based on what he is hearing today. He tells me that his son was telling him this has been doing well and that it was smaller and not really bleeding or draining much. Again this is definitely significantly larger than last time I saw him but does not appear to be obviously infected there is some undermining noted as well. In general I am unsure of what would have caused such a dramatic changes what he seems to feel has happened here but nonetheless indeed I do not feel like that this is doing nearly as well as last time I saw him. Objective Constitutional Well-nourished and well-hydrated in no acute distress. Vitals Time Taken: 3:13 AM, Height: 73 in, Weight: 154 lbs, BMI: 20.3,  Temperature: 98.6 F, Pulse: 90 bpm, Respiratory Rate: 16 breaths/min, Blood Pressure: 122/84 mmHg. Respiratory normal breathing without difficulty. Psychiatric this patient is able to make decisions and demonstrates good insight into disease process. Alert and Oriented x 3. pleasant and cooperative. General Notes: Upon inspection patient's wound bed unfortunately seems to be doing a little bit worse compared to last time I saw  him it is larger and he is somewhat frustrated with that which I completely understand. With that being said I do believe that he is going to require aggressive offloading to help this get back to where he wants it to be again up to this point we really feel like things were doing much better I am not sure what happened different over the past couple of weeks but he tells me that he is good to be more conscientious and stay off of this more he wants to get this thing healed. Integumentary (Hair, Skin) Wound #7R status is Healed - Epithelialized. Original cause of wound was Pressure Injury. The date acquired was: 10/01/2021. The wound has been in treatment 14 weeks. The wound is located on the [place]. The wound measures 0cm length x 0cm width x 0cm depth; 0cm^2 area and 0cm^3 volume. There is a medium amount of serosanguineous drainage noted. Wound #8 status is Open. Original cause of wound was Pressure Injury. The date acquired was: 09/05/2021. The wound has been in treatment 14 weeks. The wound is located on the Left Gluteus. The wound measures 2cm length x 1.8cm width x 0.9cm depth; 2.827cm^2 area and 2.545cm^3 volume. There is Fat Layer (Subcutaneous Tissue) exposed. There is no tunneling noted, however, there is undermining starting at 8:00 and ending at 11:00 with a maximum distance of 1.5cm. There is a medium amount of serosanguineous drainage noted. The wound margin is well defined and not attached to the wound base. There is small (1-33%) red, pink  granulation within the wound bed. There is a small (1-33%) amount of necrotic tissue within the wound bed including Adherent Slough. Assessment Active Problems ICD-10 Pressure ulcer of sacral region, stage 3 Pressure ulcer of other site, stage 3 Quadriplegia, C5-C7 incomplete Gastrostomy status Plan Follow-up Appointments: Return Appointment in 2 weeks. Nurse Visit as needed Home Health: Dresser: - [facility] for wound care. May utilize formulary equivalent dressing for wound treatment orders unless otherwise specified. Home Health Nurse may visit PRN to address patientoos wound care needs. Scheduled days for dressing changes to be completed; exception, patient has scheduled wound care visit that day. **Please direct any NON-WOUND related issues/requests for orders to patient's Primary Care Physician. **If current dressing causes regression in wound condition, may D/C ordered dressing product/s and apply Normal Saline Moist Dressing daily until next [facility] or Other MD appointment. **Notify [facility] of regression in wound condition at [phone]. Bathing/ Shower/ Hygiene: [name], [name] ([id number]) [id number] Page 7 of 8 Wash wounds with antibacterial soap and water. May shower; gently cleanse wound with antibacterial soap, rinse and pat dry prior to dressing wounds No tub bath. Off-Loading: Turn and reposition every 2 hours Other: - relive pressure from heels on bed WOUND #8: - Gluteus Wound Laterality: Left Cleanser: Soap and Water (Home Health) (Generic) 3 x Per Week/30 Days Discharge Instructions: Gently cleanse wound with antibacterial soap, rinse and pat dry prior to dressing wounds Prim Dressing: Silvercel Small 2x2 (in/in) ([place]) (Generic) 3 x Per Week/30 Days ary Discharge Instructions: Apply Silvercel Small 2x2 (in/in) as instructed Secondary Dressing: (BORDER) Zetuvit  Plus SILICONE BORDER Dressing 5x5 (in/in) (Home Health) (Generic) 3 x Per Week/30 Days Discharge Instructions: Please do not put silicone bordered dressings under wraps. Use non-bordered dressing only. 1. Based on what I am seeing I do believe that the patient would benefit from a continuation of care with regard to the silver cell which  again will help to keep the area clean and dry. 2. We will get a continue with the bordered foam dressing to cover. 3. I am also going to suggest that he continue to monitor for any signs of worsening or infection if anything changes he knows contact the office and let me know. We will see patient back for reevaluation in 2 weeks here in the clinic. If anything worsens or changes patient will contact our office for additional recommendations. Electronic Signature(s) Signed: 01/28/2022 6:21:26 PM By: Worthy Keeler PA-C Entered By: Worthy Keeler on 01/28/2022 18:21:25 -------------------------------------------------------------------------------- SuperBill Details Patient Name: Date of Service: [name], [place] 01/27/2022 Medical Record Number: [id number] Patient Account Number: [id number] Date of Birth/Sex: Treating RN: 1966/02/14 (56 y.o. [name] Primary Care Provider: [name] Other Clinician: Referring Provider: Treating Provider/Extender: [name] in Treatment: 14 Diagnosis Coding ICD-10 Codes Code Description [phone] Pressure ulcer of sacral region, stage 3 L89.893 Pressure ulcer of other site, stage 3 G82.54 Quadriplegia, C5-C7 incomplete Z93.1 Gastrostomy status Facility Procedures : CPT4 Code: [id number] Description: [phone] - WOUND CARE VISIT-LEV 2 EST PT Modifier: Quantity: 1 Physician Procedures : CPT4 Code Description Modifier [id number] - WC PHYS LEVEL 3 - EST PT ICD-10 Diagnosis Description L89.153 Pressure ulcer of sacral region, stage 3 L89.893 Pressure ulcer of other site, stage 3 G82.54  Quadriplegia, C5-C7 incomplete Z93.1 Gastrostomy  status [name], [name] ([id number]) [id number] Page 8 o Quantity: 1 f 8 Electronic Signature(s) Signed: 01/28/2022 6:21:49 PM By: Worthy Keeler PA-C Previous Signature: 01/27/2022 5:00:39 PM Version By: Rosalio Loud MSN RN CNS WTA Entered By: Worthy Keeler on 01/28/2022 18:21:49

## 2022-02-10 ENCOUNTER — Encounter: Payer: Medicare Other | Attending: Physician Assistant | Admitting: Internal Medicine

## 2022-02-10 DIAGNOSIS — Z931 Gastrostomy status: Secondary | ICD-10-CM | POA: Insufficient documentation

## 2022-02-10 DIAGNOSIS — F419 Anxiety disorder, unspecified: Secondary | ICD-10-CM | POA: Insufficient documentation

## 2022-02-10 DIAGNOSIS — G8254 Quadriplegia, C5-C7 incomplete: Secondary | ICD-10-CM | POA: Insufficient documentation

## 2022-02-10 DIAGNOSIS — L89893 Pressure ulcer of other site, stage 3: Secondary | ICD-10-CM | POA: Insufficient documentation

## 2022-02-10 DIAGNOSIS — I252 Old myocardial infarction: Secondary | ICD-10-CM | POA: Diagnosis not present

## 2022-02-10 DIAGNOSIS — L89153 Pressure ulcer of sacral region, stage 3: Secondary | ICD-10-CM | POA: Insufficient documentation

## 2022-02-10 DIAGNOSIS — M069 Rheumatoid arthritis, unspecified: Secondary | ICD-10-CM | POA: Diagnosis not present

## 2022-02-10 NOTE — Progress Notes (Signed)
SHAHZAD, THOMANN ([id number]) [id number] Page 1 of 7 Visit Report for 02/10/2022 HPI Details Patient Name: Date of Service: [name], [place] B. 02/10/2022 2:30 PM Medical Record Number: [id number] Patient Account Number: [id number] Date of Birth/Sex: Treating RN: 1965-10-27 (56 y.o. [name] Primary Care Provider: [name] Other Clinician: Referring Provider: Treating Provider/Extender: Eldridge Dace, MICHA EL Lupita Raider in Treatment: 16 History of Present Illness ssociated Signs and Symptoms: Patient has a history of confirmed osteomyelitis of the left foot according to MRI July 2019. 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 HPI Description: 12/08/17 on evaluation today patient presents for initial evaluation and our clinic concerning issues he has been having with his feet bilaterally although the left more than right most recently. He was admitted to hospital where he was placed on IV vancomycin which actually he continue to utilize until discharge and even when discharged continue to be on until around 16 August according to what he tells me. His PICC line was removed at that point. Nonetheless he has been seen in the wound care center in [facility] before transferring to [place] due to the fact that the doctor there left and they are no longer open. Nonetheless he does have significant osteomyelitis of the left foot noted on MRI which revealed significant issues with necrotic bone including a portion of the distal region of his left great toe which was felt to possibly either be missing as a result of amputation or potentially resorption. Either way he also had osteomyelitis noted of the digit, metatarsal region, cuneiform, and navicular bones. There is definite bone exposure noted externally at this point in time. In regard to the right foot he has issues with  ulcerations here as well although he states that recently he's had no issues until this just blistered and reopened. Apparently Xeroform was used in this has caused things to be somewhat more moist and open more according to the patient. He's otherwise been tolerating the dressing changes without complication using silver alginate dressings. He has no discomfort but does seem to be extremely stressed regarding the fact that he did see Dr. 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 The patient stated per notes reviewed that he did not want to have any surgery and therefore has a repeat appointment scheduled with the surgeon on 12/15/17. With all that being said he feels like that he really has not been alerted to what was going on in the severity that was until just recently and has a lot of questions in that regard. I'll be see I explained that seeing him for the first time today I cannot answer a lot of those questions that he has. 12/21/17 on evaluation today patient actually appears to be doing a little better in regard to the right foot in particular. There is one area where there still definitive bone noted although the MRI which I did review today that we had ordered was negative for any evidence of osteomyelitis which is good news. Nonetheless there was some necrotic bone noted. Overall the patient appears to be doing fairly well however in regard to the right foot. Unfortunately he does have a new open area on the posterior aspect of his left leg as well as the continued area of necrosis noted in the central portion of the foot. He states that he's actually going to be sent  for reevaluation specifically to a limb salvage clinic at [facility] he believes this is something that is primary care provider has been working with. They were just awaiting the results of the MRI. 01/08/18 on evaluation today patient actually appears to be doing maybe just a little better  in regard to the overall appearance of his bilateral foot ulcers. In general this does not seem to have worsened at least which is good news. He still has evidence of bone noted on the surface of both wound areas which though dry appearing really has not otherwise changed at this time. He actually has an appointment at [facility] with the wound center to discuss limb salvage. He discusses with me at the last visit. Nonetheless he was somewhat upset today about a couple things one being that he stated that we did not put the order in for the protective dressing for his knees that we discussed last time. With that being said I had put in the order for the protective dressing in fact it was on his order sheet that was signed on 12/21/17. Nonetheless unfortunately it appears this was overlooked by home health and they told him if they had the order they could put the protective dressing on. Nonetheless they told him they did not have the order. Again I believe this was an oversight nothing intentional on anyone's part nonetheless the order was there. Still I had explained to the patient sometimes insurance will not cover for the protective dressing even if it is ordered and this was discussed in the last visit. 01/25/18 on evaluation today patient presents for follow-up concerning his bilateral lower extremity ulcers. He has actually been doing about the same since I last saw him. We do have them in the room we can look at his gluteal region today as well to ensure that there is nothing open or if so address the issue there. Nonetheless he was supposed to see you and see wound care/limb salvage prior to his visit with me today. Unfortunately when he arrived last time at their clinic he apparently was there on the wrong day the doctor was not even present that he was his to be seen. Nonetheless I had to be switched to a different day and the patient actually is going in the next week to see them. Assuming everything  works out this should actually be tomorrow. Nonetheless if they take over care of that point then we will subsequently release care to them. Readmission: 10-15-2021 upon evaluation today patient appears to be doing somewhat poorly in regard to wounds over the left gluteal region. He also has areas in the midline sacrum although this is not quite as bad. There is however some evidence of necrotic bone noted at this point unfortunately. The question is whether this is just something working its way out or if it something that is actually actively infected at this point. We are going to have to evaluate that further little by little as we go with additional diagnostics. With that being said the patient tells me currently that what we are seeing him for has been open since around 09-05-2021 for the gluteal area on the left and in regard to the sacrum 10-01-2021 he states. With that being said he again is not too significant as far as the overall appearance of the wounds are concerned but again with the bone exposed this is something that we may want to do at pathology and culture in regard to. He  has previously been treated for osteomyelitis of the sacral area. 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 This is a gentleman whom I have previously seen back in 2019. The wound that we were taking care of back at that time is completely cleared. 7/25; 2-week follow-up. The patient has 3 small open areas in the lower sacrum and then an area over the left ischial tuberosity. Both of these are stage III wounds. He has been using Hydrofera Blue. 11-12-2021 upon evaluation today patient appears to be doing well currently in regard to his wounds. The distal sacral area is almost completely closed and looks to be doing great. In regard to the left gluteal region this is showing signs of improvement from a depth perspective size is about the same. Overall I am extremely  pleased with where things stand compared to what we were previous. MAKHAI, FULCO ([id number]) [id number] Page 2 of 7 12-03-2021 upon evaluation today patient appears to be doing well currently in regard to his wound. He is actually showing signs of improvement the sacral region and the right ischial/gluteal region is completely healed. The left ischial/gluteal region is still open but seems to be doing much better. 12-24-2021 upon evaluation today patient appears to be doing well currently in regard to some of his wounds others have reopened or are not doing quite as well. Overall though I think that he is on a good track he just may be needs to be a little bit better on appropriate offloading we discussed that today as well. 01-27-2022 upon evaluation today patient appears to be doing well currently in regard to his wound. He is not showing any signs of significant tissue breakdown but unfortunately the wound is larger than what it was last time I saw him. He tells me that this seems to be a complete shock based on what he is hearing today. He tells me that his son was telling him this has been doing well and that it was smaller and not really bleeding or draining much. Again this is definitely significantly larger than last time I saw him but does not appear to be obviously infected there is some undermining noted as well. In general I am unsure of what would have caused such a dramatic changes what he seems to feel has happened here but nonetheless indeed I do not feel like that this is doing nearly as well as last time I saw him. 11/6; his wound measured better today per our intake nurse using silver alginate and border foam although according to the patient were having trouble with supplies especially the border foam. He has home health. Electronic Signature(s) Signed: 02/10/2022 4:14:14 PM By: Linton Ham MD Entered By: Linton Ham on 02/10/2022  15:20:06 -------------------------------------------------------------------------------- Physical Exam Details Patient Name: Date of Service: [name], Jax[place] B. 02/10/2022 2:30 PM Medical Record Number: [id number] Patient Account Number: [id number] Date of Birth/Sex: Treating RN: 1965/09/24 (56 y.o. [name] Primary Care Provider: [name] Other Clinician: Referring Provider: Treating Provider/Extender: RO BSO N, MICHA EL Lupita Raider in Treatment: 16 Constitutional Sitting or standing Blood Pressure is within target range for patient.. Pulse regular and within target range for patient.Marland Kitchen Respirations regular, non-labored and within target range.. Temperature is normal and within the target range for the patient.Marland Kitchen appears in no distress. Notes Wound exam; left buttock wound. No exposed bone. No evidence of surrounding infection. Granulation looks healthy although he will  need to have more of this to try and granulate in. Slight amount of undermining Electronic Signature(s) Signed: 02/10/2022 4:14:14 PM By: Linton Ham MD Entered By: Linton Ham on 02/10/2022 15:21:54 -------------------------------------------------------------------------------- Physician Orders Details Patient Name: Date of Service: [name], Kazim B. 02/10/2022 2:30 PM Medical Record Number: [id number] Patient Account Number: [id number] Date of Birth/Sex: Treating RN: 29-Mar-1966 (56 y.o. [name] Primary Care Provider: [name] Other Clinician: Referring Provider: Treating Provider/Extender: [address], MICHA EL Vester, Balthazor, Jeromey B ([id number]) [id number] Page 3 of 7 Weeks in Treatment: 16 Verbal / Phone Orders: No Diagnosis Coding ICD-10 Coding Code Description L89.153 Pressure ulcer of sacral region, stage 3 L89.893 Pressure ulcer of other site, stage 3 G82.54 Quadriplegia, C5-C7 incomplete Z93.1 Gastrostomy status Follow-up  Appointments Return Appointment in 2 weeks. Nurse Visit as needed [place]: [facility] [place] for wound care. May utilize formulary equivalent dressing for wound treatment orders unless otherwise specified. Home Health Nurse may visit PRN to address patients wound care needs. Scheduled days for dressing changes to be completed; exception, patient has scheduled wound care visit that day. **Please direct any NON-WOUND related issues/requests for orders to patient's Primary Care Physician. **If current dressing causes regression in wound condition, may D/C ordered dressing product/s and apply Normal Saline Moist Dressing daily until next [place] or Other MD appointment. **Notify [facility] of regression in wound condition at [phone]. Bathing/ [employer] wounds with antibacterial soap and water. May shower; gently cleanse wound with antibacterial soap, rinse and pat dry prior to dressing wounds No tub bath. Off-Loading Turn and reposition every 2 hours Other: - relive pressure from heels on bed Wound Treatment Wound #8 - Gluteus Wound Laterality: Left Cleanser: Soap and Water (Home Health) (Generic) 3 x Per Week/30 Days Discharge Instructions: Gently cleanse wound with antibacterial soap, rinse and pat dry prior to dressing wounds Prim Dressing: Silvercel Small 2x2 (in/in) ([place]) (Generic) 3 x Per Week/30 Days ary Discharge Instructions: Apply Silvercel Small 2x2 (in/in) as instructed Secondary Dressing: (BORDER) Zetuvit Plus SILICONE BORDER Dressing 5x5 (in/in) (Home Health) (Generic) 3 x Per Week/30 Days Discharge Instructions: Please do not put silicone bordered dressings under wraps. Use non-bordered dressing only. Electronic Signature(s) Signed: 02/12/2022 3:12:33 PM By: Rosalio Loud MSN RN CNS WTA Signed: 02/13/2022 3:56:39 PM By: Linton Ham MD Entered By: Rosalio Loud on 02/12/2022  15:12:33 -------------------------------------------------------------------------------- Problem List Details Patient Name: Date of Service: [name], [place] B. 02/10/2022 2:30 PM Medical Record Number: [id number] Patient Account Number: [id number] Date of Birth/Sex: Treating RN: 09/01/1965 (56 y.o. [name] Primary Care Provider: [name] Other Clinician: Referring Provider: Treating Provider/Extender: Eldridge Dace, [place] EL Lupita Raider in Treatment: [address] Alcee B ([id number]) [id number] Page 4 of 7 Active Problems ICD-10 Encounter Code Description Active Date MDM Diagnosis L89.153 Pressure ulcer of sacral region, stage 3 10/15/2021 No Yes L89.893 Pressure ulcer of other site, stage 3 10/15/2021 No Yes G82.54 Quadriplegia, C5-C7 incomplete 10/15/2021 No Yes Z93.1 Gastrostomy status 10/15/2021 No Yes Inactive Problems Resolved Problems Electronic Signature(s) Signed: 02/10/2022 4:14:14 PM By: Linton Ham MD Entered By: Linton Ham on 02/10/2022 15:19:20 -------------------------------------------------------------------------------- Progress Note Details Patient Name: Date of Service: [name], Kimi B. 02/10/2022 2:30 PM Medical Record Number: [id number] Patient Account Number: [id number] Date of Birth/Sex: Treating RN: 1966-02-23 (56 y.o. [name] Primary Care Provider: [name] Other Clinician: Referring Provider: Treating  Provider/Extender: RO BSO N, MICHA EL Lupita Raider in Treatment: 16 Subjective History of Present Illness (HPI) The following HPI elements were documented for the patient's wound: Associated Signs and Symptoms: Patient has a history of confirmed osteomyelitis of the left foot according to MRI July 2019. 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 12/08/17 on evaluation today patient presents for initial evaluation  and our clinic concerning issues he has been having with his feet bilaterally although the left more than right most recently. He was admitted to hospital where he was placed on IV vancomycin which actually he continue to utilize until discharge and even when discharged continue to be on until around 16 August according to what he tells me. His PICC line was removed at that point. Nonetheless he has been seen in the wound care center in [facility] before transferring to [place] due to the fact that the doctor there left and they are no longer open. Nonetheless he does have significant osteomyelitis of the left foot noted on MRI which revealed significant issues with necrotic bone including a portion of the distal region of his left great toe which was felt to possibly either be missing as a result of amputation or potentially resorption. Either way he also had osteomyelitis noted of the digit, metatarsal region, cuneiform, and navicular bones. There is definite bone exposure noted externally at this point in time. In regard to the right foot he has issues with ulcerations here as well although he states that recently he's had no issues until this just blistered and reopened. Apparently Xeroform was used in this has caused things to be somewhat more moist and open more according to the patient. He's otherwise been tolerating the dressing changes without complication using silver alginate dressings. He has no discomfort but does seem to be extremely stressed regarding the fact that he did see Dr. 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 The patient stated per notes reviewed that he did not want to have any surgery and therefore has a repeat appointment scheduled with the surgeon on 12/15/17. With all that being said he feels like that he really has not been alerted to what was going on in the severity that was until just recently and has a lot of questions in that  regard. I'll be see I explained that seeing him for the first time today I cannot answer a lot of those questions that he has. 12/21/17 on evaluation today patient actually appears to be doing a little better in regard to the right foot in particular. There is one area where there still definitive bone noted although the MRI which I did review today that we had ordered was negative for any evidence of osteomyelitis which is good news. Nonetheless there was some necrotic bone noted. Overall the patient appears to be doing fairly well however in regard to the right foot. Unfortunately he does EMMANUEL, GRUENHAGEN B ([id number]) [id number] Page 5 of 7 have a new open area on the posterior aspect of his left leg as well as the continued area of necrosis noted in the central portion of the foot. He states that he's actually going to be sent for reevaluation specifically to a limb salvage clinic at [facility] he believes this is something that is primary care provider has been working with. They were just awaiting the results of the MRI. 01/08/18 on evaluation  today patient actually appears to be doing maybe just a little better in regard to the overall appearance of his bilateral foot ulcers. In general this does not seem to have worsened at least which is good news. He still has evidence of bone noted on the surface of both wound areas which though dry appearing really has not otherwise changed at this time. He actually has an appointment at [facility] with the wound center to discuss limb salvage. He discusses with me at the last visit. Nonetheless he was somewhat upset today about a couple things one being that he stated that we did not put the order in for the protective dressing for his knees that we discussed last time. With that being said I had put in the order for the protective dressing in fact it was on his order sheet that was signed on 12/21/17. Nonetheless unfortunately  it appears this was overlooked by home health and they told him if they had the order they could put the protective dressing on. Nonetheless they told him they did not have the order. Again I believe this was an oversight nothing intentional on anyone's part nonetheless the order was there. Still I had explained to the patient sometimes insurance will not cover for the protective dressing even if it is ordered and this was discussed in the last visit. 01/25/18 on evaluation today patient presents for follow-up concerning his bilateral lower extremity ulcers. He has actually been doing about the same since I last saw him. We do have them in the room we can look at his gluteal region today as well to ensure that there is nothing open or if so address the issue there. Nonetheless he was supposed to see you and see wound care/limb salvage prior to his visit with me today. Unfortunately when he arrived last time at their clinic he apparently was there on the wrong day the doctor was not even present that he was his to be seen. Nonetheless I had to be switched to a different day and the patient actually is going in the next week to see them. Assuming everything works out this should actually be tomorrow. Nonetheless if they take over care of that point then we will subsequently release care to them. Readmission: 10-15-2021 upon evaluation today patient appears to be doing somewhat poorly in regard to wounds over the left gluteal region. He also has areas in the midline sacrum although this is not quite as bad. There is however some evidence of necrotic bone noted at this point unfortunately. The question is whether this is just something working its way out or if it something that is actually actively infected at this point. We are going to have to evaluate that further little by little as we go with additional diagnostics. With that being said the patient tells me currently that what we are seeing him for  has been open since around 09-05-2021 for the gluteal area on the left and in regard to the sacrum 10-01-2021 he states. With that being said he again is not too significant as far as the overall appearance of the wounds are concerned but again with the bone exposed this is something that we may want to do at pathology and culture in regard to. 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 This is a gentleman whom I have previously seen back in 2019. The  wound that we were taking care of back at that time is completely cleared. 7/25; 2-week follow-up. The patient has 3 small open areas in the lower sacrum and then an area over the left ischial tuberosity. Both of these are stage III wounds. He has been using Hydrofera Blue. 11-12-2021 upon evaluation today patient appears to be doing well currently in regard to his wounds. The distal sacral area is almost completely closed and looks to be doing great. In regard to the left gluteal region this is showing signs of improvement from a depth perspective size is about the same. Overall I am extremely pleased with where things stand compared to what we were previous. 12-03-2021 upon evaluation today patient appears to be doing well currently in regard to his wound. He is actually showing signs of improvement the sacral region and the right ischial/gluteal region is completely healed. The left ischial/gluteal region is still open but seems to be doing much better. 12-24-2021 upon evaluation today patient appears to be doing well currently in regard to some of his wounds others have reopened or are not doing quite as well. Overall though I think that he is on a good track he just may be needs to be a little bit better on appropriate offloading we discussed that today as well. 01-27-2022 upon evaluation today patient appears to be doing well currently  in regard to his wound. He is not showing any signs of significant tissue breakdown but unfortunately the wound is larger than what it was last time I saw him. He tells me that this seems to be a complete shock based on what he is hearing today. He tells me that his son was telling him this has been doing well and that it was smaller and not really bleeding or draining much. Again this is definitely significantly larger than last time I saw him but does not appear to be obviously infected there is some undermining noted as well. In general I am unsure of what would have caused such a dramatic changes what he seems to feel has happened here but nonetheless indeed I do not feel like that this is doing nearly as well as last time I saw him. 11/6; his wound measured better today per our intake nurse using silver alginate and border foam although according to the patient were having trouble with supplies especially the border foam. He has home health. Objective Constitutional Sitting or standing Blood Pressure is within target range for patient.. Pulse regular and within target range for patient.Marland Kitchen Respirations regular, non-labored and within target range.. Temperature is normal and within the target range for the patient.Marland Kitchen appears in no distress. Vitals Time Taken: 2:40 PM, Height: 73 in, Weight: 154 lbs, BMI: 20.3, Temperature: 98.2 F, Pulse: 98 bpm, Respiratory Rate: 16 breaths/min, Blood Pressure: 101/69 mmHg. General Notes: Wound exam; left buttock wound. No exposed bone. No evidence of surrounding infection. Granulation looks healthy although he will need to have more of this to try and granulate in. Slight amount of undermining Integumentary (Hair, Skin) Wound #8 status is Open. Original cause of wound was Pressure Injury. The date acquired was: 09/05/2021. The wound has been in treatment 16 weeks. The wound is located on the Left Gluteus. The wound measures 1.8cm length x 1.7cm width x 0.6cm  depth; 2.403cm^2 area and 1.442cm^3 volume. There is Fat Layer (Subcutaneous Tissue) exposed. There is no tunneling noted, however, there is undermining starting at 12:00 and ending at 8:00 with a maximum distance  of 0.6cm. There is a medium amount of serosanguineous drainage noted. The wound margin is well defined and not attached to the wound base. There is small (1-33%) red, pink granulation within the wound bed. There is a small (1-33%) amount of necrotic tissue within the wound bed. Assessment DREQUAN, IRONSIDE ([id number]) [id number] Page 6 of 7 Active Problems ICD-10 Pressure ulcer of sacral region, stage 3 Pressure ulcer of other site, stage 3 Quadriplegia, C5-C7 incomplete Gastrostomy status Plan Follow-up Appointments: Return Appointment in 2 weeks. 1. We are making progress here 2. Generally healthy looking granulation although not much over bone in certain areas. Slight amount of undermining 3. He says he is not in the wheelchair that much. Not able to change position in bed independently although when he is positioned he can maintain it. 4. Some problems with obtaining dressings through home health 5. Continue silver alginate for now Electronic Signature(s) Signed: 02/10/2022 4:14:14 PM By: Linton Ham MD Entered By: Linton Ham on 02/10/2022 15:24:51 -------------------------------------------------------------------------------- SuperBill Details Patient Name: Date of Service: [name], Lawton B. 02/10/2022 Medical Record Number: [id number] Patient Account Number: [id number] Date of Birth/Sex: Treating RN: 07-Apr-1966 (56 y.o. [name] Primary Care Provider: [name] Other Clinician: Referring Provider: Treating Provider/Extender: Eldridge Dace, [place] EL Lupita Raider in Treatment: 16 Diagnosis Coding ICD-10 Codes Code Description L89.153 Pressure ulcer of sacral region, stage 3 L89.893 Pressure ulcer of other  site, stage 3 G82.54 Quadriplegia, C5-C7 incomplete Z93.1 Gastrostomy status Facility Procedures : CPT4 Code: [id number] Description: 99213 - WOUND CARE VISIT-LEV 3 EST PT Modifier: Quantity: 1 Physician Procedures : CPT4 Code Description Modifier [id number] 71062 - WC PHYS LEVEL 3 - EST PT ICD-10 Diagnosis Description L89.893 Pressure ulcer of other site, stage 3 L89.153 Pressure ulcer of sacral region, stage 3 Heuerman, Merlin B ([id number])  [id number] Page Quantity: 1 7 of 7 Electronic Signature(s) Signed: 02/12/2022 3:14:34 PM By: Rosalio Loud MSN RN CNS WTA Signed: 02/13/2022 3:56:39 PM By: Linton Ham MD Previous Signature: 02/10/2022 4:14:14 PM Version By: Linton Ham MD Entered By: Rosalio Loud on 02/12/2022 15:14:33

## 2022-02-10 NOTE — Progress Notes (Signed)
[name] ([id number]) [id number] Page 1 of 7 Visit Report for 02/10/2022 Arrival Information Details Patient Name: Date of Service: [name], [name] 02/10/2022 2:30 PM Medical Record Number: [id number] Patient Account Number: [id number] Date of Birth/Sex: Treating RN: 1965/04/12 (56 y.o. [name] Primary Care [name]: [name] Other Clinician: Referring [name]: Treating Mont Jagoda/Extender: [name], [name] in Treatment: 16 Visit Information History Since Last Visit Added or deleted any medications: No Patient Arrived: Wheel Chair Any new allergies or adverse reactions: No Arrival Time: 14:39 Had a fall or experienced change in No Accompanied By: self activities of daily living that may affect Transfer Assistance: Harrel Lemon Lift risk of falls: Patient Identification Verified: Yes Hospitalized since last visit: No Secondary Verification Process Completed: Yes Pain Present Now: No Patient Requires Transmission-Based Precautions: No Patient Has Alerts: No Electronic Signature(s) Signed: 02/10/2022 3:06:10 PM By: Rosalio Loud MSN RN CNS WTA Entered By: Rosalio Loud on 02/10/2022 15:06:10 -------------------------------------------------------------------------------- Encounter Discharge Information Details Patient Name: Date of Service: [name], [name]. 02/10/2022 2:30 PM Medical Record Number: [id number] Patient Account Number: [id number] Date of Birth/Sex: Treating RN: 1965-07-14 (56 y.o. [name] Primary Care Soraiya [name]: [name] Other Clinician: Referring Lyndsee Casa: Treating Dazhane Villagomez/Extender: Colin BSO [name], [name] in Treatment: 16 Encounter Discharge Information Items Discharge Condition: Stable Ambulatory Status: Wheelchair Discharge Destination: Home Transportation: Private Auto Accompanied By: self Schedule Follow-up Appointment: Yes Clinical Summary of  Care: Electronic Signature(s) Signed: 02/10/2022 4:25:10 PM By: Rosalio Loud MSN RN CNS [name], [name] ([id number]) [id number] Page 2 of 7 Entered By: Rosalio Loud on 02/10/2022 15:22:13 -------------------------------------------------------------------------------- Lower Extremity Assessment Details Patient Name: Date of Service: [name], [name] 02/10/2022 2:30 PM Medical Record Number: [id number] Patient Account Number: [id number] Date of Birth/Sex: Treating RN: 1965-10-21 (56 y.o. [name] Primary Care [name]: [name] Other Clinician: Referring [name]: Treating Yostin Malacara/Extender: [name], [name] in Treatment: 16 Electronic Signature(s) Signed: 02/10/2022 4:25:10 PM By: Rosalio Loud MSN RN CNS WTA Entered By: Rosalio Loud on 02/10/2022 15:15:18 -------------------------------------------------------------------------------- Multi Wound Chart Details Patient Name: Date of Service: [name], [name]. 02/10/2022 2:30 PM Medical Record Number: [id number] Patient Account Number: [id number] Date of Birth/Sex: Treating RN: 1965-08-28 (56 y.o. [name] Primary Care Roslynn Holte: [name] Other Clinician: Referring Shirel Mallis: Treating Geovanni Rahming/Extender: [name], [name] in Treatment: 16 Vital Signs Height(in): 73 Pulse(bpm): 98 Weight(lbs): 154 Blood Pressure(mmHg): 101/69 Body Mass Index(BMI): 20.3 Temperature(F): 98.2 Respiratory Rate(breaths/min): 16 [8:Photos:] [[name]/A:[name]/A] Left Gluteus [name]/A [name]/A Wound Location: Pressure Injury [name]/A [name]/A Wounding Event: Pressure Ulcer [name]/A [name]/A Primary Etiology: Hypotension, Myocardial Infarction, [name]/A [name]/A Comorbid History: History of pressure wounds, Rheumatoid Arthritis, Paraplegia, Bastyr, [name] ([id number]) [id number] Page 3 of 7 Confinement Anxiety 09/05/2021 [name]/A [name]/A Date Acquired: 65 [name]/A [name]/A Weeks  of Treatment: Open [name]/A [name]/A Wound Status: No [name]/A [name]/A Wound Recurrence: 1.8x1.7x0.6 [name]/A [name]/A Measurements L x W x D (cm) 2.403 [name]/A [name]/A A (cm) : rea 1.442 [name]/A [name]/A Volume (cm) : 56.30% [name]/A [name]/A % Reduction in A rea: 73.80% [name]/A [name]/A % Reduction in Volume: 12 Starting Position 1 (o'clock): 8 Ending Position 1 (o'clock): 0.6 Maximum Distance 1 (cm): Yes [name]/A [name]/A Undermining: Category/Stage III [name]/A [name]/A Classification: Medium [name]/A [name]/A Exudate A mount: Serosanguineous [name]/A [name]/A Exudate Type: red, brown [name]/A [name]/A Exudate Color: Well defined, not attached [name]/A [name]/A Wound Margin:  Small (1-33%) [name]/A [name]/A Granulation A mount: Red, Pink [name]/A [name]/A Granulation Quality: Small (1-33%) [name]/A [name]/A Necrotic A mount: Fat Layer (Subcutaneous Tissue): Yes [name]/A [name]/A Exposed Structures: Fascia: No Tendon: No Muscle: No Joint: No Bone: No Small (1-33%) [name]/A [name]/A Epithelialization: Treatment Notes Electronic Signature(s) Signed: 02/10/2022 4:25:10 PM By: Rosalio Loud MSN RN CNS WTA Entered By: Rosalio Loud on 02/10/2022 15:15:39 -------------------------------------------------------------------------------- Multi-Disciplinary Care Plan Details Patient Name: Date of Service: [name], [name]. 02/10/2022 2:30 PM Medical Record Number: [id number] Patient Account Number: [id number] Date of Birth/Sex: Treating RN: June 25, 1965 (56 y.o. [name] Primary Care Kary Colaizzi: [name] Other Clinician: Referring Sacha Radloff: Treating Ballard Budney/Extender: Colin BSO [name], [name] in Treatment: 16 Active Inactive Pressure Nursing Diagnoses: Knowledge deficit related to causes and risk factors for pressure ulcer development Knowledge deficit related to management of pressures ulcers Potential for impaired tissue integrity related to pressure, friction, moisture, and shear Goals: Patient will remain free from development of additional pressure ulcers Date Initiated: 12/03/2021 Target  Resolution Date: 12/03/2021 Goal Status: Active Patient/caregiver will verbalize risk factors for pressure ulcer development Date Initiated: 12/03/2021 Target Resolution Date: 12/03/2021 Goal Status: Active Patient/caregiver will verbalize understanding of pressure ulcer management Paige, Shaheer [name] ([id number]) [id number] Page 4 of 7 Date Initiated: 12/03/2021 Target Resolution Date: 12/03/2021 Goal Status: Active Interventions: Assess: immobility, friction, shearing, incontinence upon admission and as needed Assess offloading mechanisms upon admission and as needed Assess potential for pressure ulcer upon admission and as needed Provide education on pressure ulcers Notes: Wound/Skin Impairment Nursing Diagnoses: Impaired tissue integrity Knowledge deficit related to ulceration/compromised skin integrity Goals: Ulcer/skin breakdown will have a volume reduction of 30% by week 4 Date Initiated: 10/15/2021 Target Resolution Date: 11/12/2021 Goal Status: Active Ulcer/skin breakdown will have a volume reduction of 50% by week 8 Date Initiated: 10/15/2021 Target Resolution Date: 12/10/2021 Goal Status: Active Ulcer/skin breakdown will have a volume reduction of 80% by week 12 Date Initiated: 10/15/2021 Target Resolution Date: 01/07/2022 Goal Status: Active Ulcer/skin breakdown will heal within 14 weeks Date Initiated: 10/15/2021 Target Resolution Date: 01/21/2022 Goal Status: Active Interventions: Assess patient/caregiver ability to obtain necessary supplies Assess patient/caregiver ability to perform ulcer/skin care regimen upon admission and as needed Assess ulceration(s) every visit Provide education on ulcer and skin care Notes: Electronic Signature(s) Signed: 02/10/2022 4:25:10 PM By: Rosalio Loud MSN RN CNS WTA Entered By: Rosalio Loud on 02/10/2022 15:15:23 -------------------------------------------------------------------------------- Pain Assessment  Details Patient Name: Date of Service: [name], [name]. 02/10/2022 2:30 PM Medical Record Number: [id number] Patient Account Number: [id number] Date of Birth/Sex: Treating RN: 04/22/1965 (56 y.o. [name] Primary Care Lorenzo Pereyra: [name] Other Clinician: Referring Nevada Kirchner: Treating Noami Bove/Extender: Colin BSO [name], [name] in Treatment: 16 Active Problems Location of Pain Severity and Description of Pain Patient Has Paino No Site Locations [place], [place] [name] ([id number]) [id number] Page 5 of 7 Pain Management and Medication Current Pain Management: Electronic Signature(s) Signed: 02/10/2022 4:25:10 PM By: Rosalio Loud MSN RN CNS WTA Entered By: Rosalio Loud on 02/10/2022 14:40:38 -------------------------------------------------------------------------------- Wound Assessment Details Patient Name: Date of Service: [name], [name]. 02/10/2022 2:30 PM Medical Record Number: [id number] Patient Account Number: [id number] Date of Birth/Sex: Treating RN: 12/02/1965 (56 y.o. [name] Primary Care Nathaneal Sommers: [name] Other Clinician: Referring Joel Cowin: Treating Millisa Giarrusso/Extender: [name], [name] in Treatment: 16 Wound Status Wound Number: 8 Primary Pressure Ulcer Etiology: Wound Location: Left  Gluteus Wound Open Wounding Event: Pressure Injury Status: Date Acquired: 09/05/2021 Comorbid Hypotension, Myocardial Infarction, History of pressure wounds, Weeks Of Treatment: 16 History: Rheumatoid Arthritis, Paraplegia, Confinement Anxiety Clustered Wound: No Photos Wound Measurements Length: (cm) 1.8 Width: (cm) 1.7 Cafiero, [name] ([id number]) Depth: (cm) 0.6 Area: (cm) 2.40 Volume: (cm) 1.44 % Reduction in Area: 56.3% % Reduction in Volume: 73.8% [id number] Page 6 of 7 Epithelialization: Small (1-33%) 3 Tunneling: No 2 Undermining: Yes Starting  Position (o'clock): 12 Ending Position (o'clock): 8 Maximum Distance: (cm) 0.6 Wound Description Classification: Category/Stage III Wound Margin: Well defined, not attached Exudate Amount: Medium Exudate Type: Serosanguineous Exudate Color: red, brown Foul Odor After Cleansing: No Slough/Fibrino No Wound Bed Granulation Amount: Small (1-33%) Exposed Structure Granulation Quality: Red, Pink Fascia Exposed: No Necrotic Amount: Small (1-33%) Fat Layer (Subcutaneous Tissue) Exposed: Yes Tendon Exposed: No Muscle Exposed: No Joint Exposed: No Bone Exposed: No Treatment Notes Wound #8 (Gluteus) Wound Laterality: Left Cleanser Peri-Wound Care Topical Primary Dressing Secondary Dressing Secured With Compression Wrap Compression Stockings Add-Ons Electronic Signature(s) Signed: 02/10/2022 4:25:10 PM By: Rosalio Loud MSN RN CNS WTA Entered By: Rosalio Loud on 02/10/2022 14:57:52 -------------------------------------------------------------------------------- Vitals Details Patient Name: Date of Service: [name], [name]. 02/10/2022 2:30 PM Medical Record Number: [id number] Patient Account Number: [id number] Date of Birth/Sex: Treating RN: 1965/09/05 (56 y.o. [name] Primary Care Jonique Kulig: [name] Other Clinician: Referring Ishaq Maffei: Treating Dionis Autry/Extender: [name], [place] EL Lupita [name] in Treatment: 16 Vital Signs Time Taken: 14:40 Temperature (F): 98.2 Height (in): 73 Pulse (bpm): 98 Weight (lbs): 154 Respiratory Rate (breaths/min): 16 Yahr, [name] ([id number]) [id number] Page 7 of 7 Body Mass Index (BMI): 20.3 Blood Pressure (mmHg): 101/69 Reference Range: 80 - 120 mg / dl Electronic Signature(s) Signed: 02/10/2022 3:06:15 PM By: Rosalio Loud MSN RN CNS WTA Entered By: Rosalio Loud on 02/10/2022 15:06:15

## 2022-02-24 ENCOUNTER — Encounter: Payer: Medicare Other | Admitting: Physician Assistant

## 2022-02-24 DIAGNOSIS — L89153 Pressure ulcer of sacral region, stage 3: Secondary | ICD-10-CM | POA: Diagnosis not present

## 2022-02-24 NOTE — Progress Notes (Signed)
TAILOR, WESTFALL B ([id number]) [id number] Page 1 of 10 Visit Report for 02/24/2022 Arrival Information Details Patient Name: Date of Service: THEOPHIL, THIVIERGE 02/24/2022 2:30 PM Medical Record Number: [id number] Patient Account Number: [id number] Date of Birth/Sex: Treating RN: Apr 14, 1965 (56 y.o. [name] Primary Care Kaoru Rezendes: [name] Other Clinician: Referring Leahann Lempke: Treating Thuan Tippett/Extender: [name] in Treatment: 18 Visit Information History Since Last Visit Added or deleted any medications: No Patient Arrived: Wheel Chair Any new allergies or adverse reactions: No Arrival Time: 14:41 Had a fall or experienced change in No Accompanied By: self activities of daily living that may affect Transfer Assistance: Harrel Lemon Lift risk of falls: Patient Identification Verified: Yes Hospitalized since last visit: No Secondary Verification Process Completed: Yes Pain Present Now: No Patient Requires Transmission-Based Precautions: No Patient Has Alerts: No Electronic Signature(s) Signed: 02/24/2022 4:59:20 PM By: Rosalio Loud MSN RN CNS WTA Entered By: Rosalio Loud on 02/24/2022 14:46:56 -------------------------------------------------------------------------------- Clinic Level of Care Assessment Details Patient Name: Date of Service: KADARIOUS, DIKES 02/24/2022 2:30 PM Medical Record Number: [id number] Patient Account Number: [id number] Date of Birth/Sex: Treating RN: 1965/09/01 (56 y.o. [name] Primary Care Other Atienza: [name] Other Clinician: Referring Nikeya Maxim: Treating Ambers Iyengar/Extender: [name] in Treatment: 18 Clinic Level of Care Assessment Items TOOL 4 Quantity Score X- 1 0 Use when only an EandM is performed on FOLLOW-UP visit ASSESSMENTS - Nursing Assessment / Reassessment X- 1 10 Reassessment of Co-morbidities (includes updates in patient status) X- 1  5 Reassessment of Adherence to Treatment Plan ASSESSMENTS - Wound and Skin A ssessment / Reassessment X - Simple Wound Assessment / Reassessment - one wound 1 5 '[]'$  - 0 Complex Wound Assessment / Reassessment - multiple wounds Pellegrin, Gyan B ([id number]) [id number] Page 2 of 10 '[]'$  - 0 Dermatologic / Skin Assessment (not related to wound area) ASSESSMENTS - Focused Assessment '[]'$  - 0 Circumferential Edema Measurements - multi extremities '[]'$  - 0 Nutritional Assessment / Counseling / Intervention '[]'$  - 0 Lower Extremity Assessment (monofilament, tuning fork, pulses) '[]'$  - 0 Peripheral Arterial Disease Assessment (using hand held doppler) ASSESSMENTS - Ostomy and/or Continence Assessment and Care '[]'$  - 0 Incontinence Assessment and Management '[]'$  - 0 Ostomy Care Assessment and Management (repouching, etc.) PROCESS - Coordination of Care X - Simple Patient / Family Education for ongoing care 1 15 '[]'$  - 0 Complex (extensive) Patient / Family Education for ongoing care X- 1 10 Staff obtains Programmer, systems, Records, T Results / Process Orders est '[]'$  - 0 Staff telephones HHA, Nursing Homes / Clarify orders / etc '[]'$  - 0 Routine Transfer to another Facility (non-emergent condition) '[]'$  - 0 Routine Hospital Admission (non-emergent condition) '[]'$  - 0 New Admissions / Biomedical engineer / Ordering NPWT Apligraf, etc. , '[]'$  - 0 Emergency Hospital Admission (emergent condition) X- 1 10 Simple Discharge Coordination '[]'$  - 0 Complex (extensive) Discharge Coordination PROCESS - Special Needs '[]'$  - 0 Pediatric / Minor Patient Management '[]'$  - 0 Isolation Patient Management '[]'$  - 0 Hearing / Language / Visual special needs '[]'$  - 0 Assessment of Community assistance (transportation, D/C planning, etc.) '[]'$  - 0 Additional assistance / Altered mentation '[]'$  - 0 Support Surface(s) Assessment (bed, cushion, seat, etc.) INTERVENTIONS - Wound Cleansing / Measurement X -  Simple Wound Cleansing - one wound 1 5 '[]'$  - 0 Complex Wound Cleansing - multiple wounds X- 1 5 Wound Imaging (photographs - any number of wounds) '[]'$  - 0 Wound  Tracing (instead of photographs) X- 1 5 Simple Wound Measurement - one wound '[]'$  - 0 Complex Wound Measurement - multiple wounds INTERVENTIONS - Wound Dressings X - Small Wound Dressing one or multiple wounds 1 10 '[]'$  - 0 Medium Wound Dressing one or multiple wounds '[]'$  - 0 Large Wound Dressing one or multiple wounds '[]'$  - 0 Application of Medications - topical '[]'$  - 0 Application of Medications - injection INTERVENTIONS - Miscellaneous '[]'$  - 0 External ear exam '[]'$  - 0 Specimen Collection (cultures, biopsies, blood, body fluids, etc.) '[]'$  - 0 Specimen(s) / Culture(s) sent or taken to Lab for analysis Latka, Quayshawn B ([id number]) [id number] Page 3 of 10 '[]'$  - 0 Patient Transfer (multiple staff / Civil Service fast streamer / Similar devices) '[]'$  - 0 Simple Staple / Suture removal (25 or less) '[]'$  - 0 Complex Staple / Suture removal (26 or more) '[]'$  - 0 Hypo / Hyperglycemic Management (close monitor of Blood Glucose) '[]'$  - 0 Ankle / Brachial Index (ABI) - do not check if billed separately X- 1 5 Vital Signs Has the patient been seen at the hospital within the last three years: Yes Total Score: 85 Level Of Care: New/Established - Level 3 Electronic Signature(s) Signed: 02/24/2022 4:59:20 PM By: Rosalio Loud MSN RN CNS WTA Entered By: Rosalio Loud on 02/24/2022 15:43:19 -------------------------------------------------------------------------------- Encounter Discharge Information Details Patient Name: Date of Service: [name], Enrrique B. 02/24/2022 2:30 PM Medical Record Number: [id number] Patient Account Number: [id number] Date of Birth/Sex: Treating RN: 1966/02/16 (56 y.o. [name] Primary Care Cristi Gwynn: [name] Other Clinician: Referring Taz Vanness: Treating Carmeline Kowal/Extender: [name] in Treatment: 18 Encounter Discharge Information Items Discharge Condition: Stable Ambulatory Status: Wheelchair Discharge Destination: Home Transportation: Private Auto Accompanied By: self Schedule Follow-up Appointment: Yes Clinical Summary of Care: Electronic Signature(s) Signed: 02/24/2022 3:44:50 PM By: Rosalio Loud MSN RN CNS WTA Entered By: Rosalio Loud on 02/24/2022 15:44:50 -------------------------------------------------------------------------------- Lower Extremity Assessment Details Patient Name: Date of Service: [name], [place] B. 02/24/2022 2:30 PM Medical Record Number: [id number] Patient Account Number: [id number] Date of Birth/Sex: Treating RN: 22-Oct-1965 (56 y.o. [name] Primary Care Arlone Lenhardt: [name] Other Clinician: Referring Tyshae Stair: Treating Diella Gillingham/Extender: Genelle Bal, Tonie B ([id number]) [id number] Page 4 of 10 Weeks in Treatment: 18 Electronic Signature(s) Signed: 02/24/2022 3:40:50 PM By: Rosalio Loud MSN RN CNS WTA Entered By: Rosalio Loud on 02/24/2022 15:40:50 -------------------------------------------------------------------------------- Multi Wound Chart Details Patient Name: Date of Service: [name], [place] B. 02/24/2022 2:30 PM Medical Record Number: [id number] Patient Account Number: [id number] Date of Birth/Sex: Treating RN: [birth year] (56 y.o. [name] Primary Care Adlyn Fife: [name] Other Clinician: Referring Eann Cleland: Treating Nara Paternoster/Extender: [name] in Treatment: 18 Vital Signs Height(in): 73 Pulse(bpm): 90 Weight(lbs): 154 Blood Pressure(mmHg): 95/68 Body Mass Index(BMI): 20.3 Temperature(F): 98.0 Respiratory Rate(breaths/min): 16 [8:Photos:] [N/A:No Photos N/A] Left Gluteus Left Gluteus N/A Wound Location: Pressure Injury Pressure Injury N/A Wounding Event: Pressure Ulcer Pressure Ulcer  N/A Primary Etiology: Hypotension, Myocardial Infarction, Hypotension, Myocardial Infarction, N/A Comorbid History: History of pressure wounds, History of pressure wounds, Rheumatoid Arthritis, Paraplegia, Rheumatoid Arthritis, Paraplegia, Confinement Anxiety Confinement Anxiety 09/05/2021 09/05/2021 N/A Date Acquired: 18 18 N/A Weeks of Treatment: Open Open N/A Wound Status: No No N/A Wound Recurrence: 1.5x0.8x0.4 1.5x0.8x0.4 N/A Measurements L x W x D (cm) 0.942 0.942 N/A A (cm) : rea 0.377 0.377 N/A Volume (cm) : 82.90% 82.90% N/A % Reduction in A rea: 93.10% 93.10% N/A %  Reduction in Volume: 10 Starting Position 1 (o'clock): 11 Ending Position 1 (o'clock): 0.2 Maximum Distance 1 (cm): N/A Yes N/A Undermining: Category/Stage III Category/Stage III N/A Classification: Medium Medium N/A Exudate A mount: Serosanguineous Serosanguineous N/A Exudate Type: red, brown red, brown N/A Exudate Color: Well defined, not attached N/A N/A Wound Margin: Small (1-33%) N/A N/A Granulation A mount: Red, Pink N/A N/A Granulation Quality: Small (1-33%) N/A N/A Necrotic A mount: Fat Layer (Subcutaneous Tissue): Yes N/A N/A Exposed Structures: Fascia: No Lites, Jerric B ([id number]) [id number] Page 5 of 10 Tendon: No Muscle: No Joint: No Bone: No Small (1-33%) N/A N/A Epithelialization: Treatment Notes Electronic Signature(s) Signed: 02/24/2022 3:41:08 PM By: Rosalio Loud MSN RN CNS WTA Entered By: Rosalio Loud on 02/24/2022 15:41:08 -------------------------------------------------------------------------------- Multi-Disciplinary Care Plan Details Patient Name: Date of Service: [name], [place] B. 02/24/2022 2:30 PM Medical Record Number: [id number] Patient Account Number: [id number] Date of Birth/Sex: Treating RN: Aug 08, 1965 (56 y.o. [name] Primary Care [place] Warriner: [name] Other Clinician: Referring Montrez Marietta: Treating  Juandedios Dudash/Extender: [name] in Treatment: 18 Active Inactive Pressure Nursing Diagnoses: Knowledge deficit related to causes and risk factors for pressure ulcer development Knowledge deficit related to management of pressures ulcers Potential for impaired tissue integrity related to pressure, friction, moisture, and shear Goals: Patient will remain free from development of additional pressure ulcers Date Initiated: 12/03/2021 Target Resolution Date: 12/03/2021 Goal Status: Active Patient/caregiver will verbalize risk factors for pressure ulcer development Date Initiated: 12/03/2021 Target Resolution Date: 12/03/2021 Goal Status: Active Patient/caregiver will verbalize understanding of pressure ulcer management Date Initiated: 12/03/2021 Target Resolution Date: 12/03/2021 Goal Status: Active Interventions: Assess: immobility, friction, shearing, incontinence upon admission and as needed Assess offloading mechanisms upon admission and as needed Assess potential for pressure ulcer upon admission and as needed Provide education on pressure ulcers Notes: Wound/Skin Impairment Nursing Diagnoses: Impaired tissue integrity Knowledge deficit related to ulceration/compromised skin integrity Goals: Ulcer/skin breakdown will have a volume reduction of 30% by week 4 Date Initiated: 10/15/2021 Target Resolution Date: 11/12/2021 Goal Status: Active MARSDEN, ZAINO ([id number]) [id number] Page 6 of 10 Ulcer/skin breakdown will have a volume reduction of 50% by week 8 Date Initiated: 10/15/2021 Target Resolution Date: 12/10/2021 Goal Status: Active Ulcer/skin breakdown will have a volume reduction of 80% by week 12 Date Initiated: 10/15/2021 Target Resolution Date: 01/07/2022 Goal Status: Active Ulcer/skin breakdown will heal within 14 weeks Date Initiated: 10/15/2021 Target Resolution Date: 01/21/2022 Goal Status: Active Interventions: Assess  patient/caregiver ability to obtain necessary supplies Assess patient/caregiver ability to perform ulcer/skin care regimen upon admission and as needed Assess ulceration(s) every visit Provide education on ulcer and skin care Notes: Electronic Signature(s) Signed: 02/24/2022 3:40:57 PM By: Rosalio Loud MSN RN CNS WTA Entered By: Rosalio Loud on 02/24/2022 15:40:57 -------------------------------------------------------------------------------- Pain Assessment Details Patient Name: Date of Service: [name], Lannie B. 02/24/2022 2:30 PM Medical Record Number: [id number] Patient Account Number: [id number] Date of Birth/Sex: Treating RN: Aug 22, 1965 (56 y.o. [name] Primary Care Lynton Crescenzo: [name] Other Clinician: Referring Haliyah Fryman: Treating Marnita Poirier/Extender: [name] in Treatment: 18 Active Problems Location of Pain Severity and Description of Pain Patient Has Paino No Site Locations Pain Management and Medication Current Pain Management: Electronic Signature(s) Signed: 02/24/2022 4:59:20 PM By: Rosalio Loud MSN RN CNS [name], Kingslee B ([id number]) [id number] Page 7 of 10 Entered By: Rosalio Loud on 02/24/2022 14:47:34 -------------------------------------------------------------------------------- Patient/Caregiver Education Details Patient Name: Date of Service: [name], Elihue B. 11/20/2023andnbsp2:30 PM  Medical Record Number: [id number] Patient Account Number: [id number] Date of Birth/Gender: Treating RN: [birth year] (56 y.o. [name] Primary Care Physician: [name] Other Clinician: Referring Physician: Treating Physician/Extender: [name] in Treatment: 18 Education Assessment Education Provided To: Patient Education Topics Provided Pressure: Handouts: Pressure Ulcers: Care and Offloading, Pressure Ulcers: Care and Offloading 2 Methods: Explain/Verbal Responses:  State content correctly Wound/Skin Impairment: Handouts: Caring for Your Ulcer Methods: Explain/Verbal Responses: State content correctly Electronic Signature(s) Signed: 02/24/2022 4:59:20 PM By: Rosalio Loud MSN RN CNS WTA Entered By: Rosalio Loud on 02/24/2022 15:44:05 -------------------------------------------------------------------------------- Wound Assessment Details Patient Name: Date of Service: [name], [place] B. 02/24/2022 2:30 PM Medical Record Number: [id number] Patient Account Number: [id number] Date of Birth/Sex: Treating RN: May 26, 1965 (56 y.o. 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 Wound Open Wounding Event: Pressure Injury Status: Date Acquired: 09/05/2021 DAILEN, MCCLISH ([id number]) [id number] Page 8 of 10 Date Acquired: 09/05/2021 Comorbid Hypotension, Myocardial Infarction, History of pressure wounds, Weeks Of Treatment: 18 History: Rheumatoid Arthritis, Paraplegia, Confinement Anxiety Clustered Wound: No Photos Wound Measurements Length: (cm) 1.5 Width: (cm) 0.8 Depth: (cm) 0.4 Area: (cm) 0.942 Volume: (cm) 0.377 % Reduction in Area: 82.9% % Reduction in Volume: 93.1% Epithelialization: Small (1-33%) Wound Description Classification: Category/Stage III Wound Margin: Well defined, not attached Exudate Amount: Medium Exudate Type: Serosanguineous Exudate Color: red, brown Foul Odor After Cleansing: No Slough/Fibrino No Wound Bed Granulation Amount: Small (1-33%) Exposed Structure Granulation Quality: Red, Pink Fascia Exposed: No Necrotic Amount: Small (1-33%) Fat Layer (Subcutaneous Tissue) Exposed: Yes Tendon Exposed: No Muscle Exposed: No Joint Exposed: No Bone Exposed: No Electronic  Signature(s) Signed: 02/24/2022 4:59:20 PM By: Rosalio Loud MSN RN CNS WTA Entered By: Rosalio Loud on 02/24/2022 15:06:39 -------------------------------------------------------------------------------- Wound Assessment Details Patient Name: Date of Service: [name], Cirilo B. 02/24/2022 2:30 PM Medical Record Number: [id number] Patient Account Number: [id number] Date of Birth/Sex: Treating RN: 09-25-1965 (56 y.o. [name] Primary Care Tiarah Shisler: [name] Other Clinician: Referring Folasade Mooty: Treating Samik Balkcom/Extender: [name] in Treatment: 18 Wound Status Wound Number: 8 Primary Pressure Ulcer Etiology: Wound Location: Left Gluteus Wound Open Wounding Event: Pressure Injury Status: Date Acquired: 09/05/2021 Comorbid Hypotension, Myocardial Infarction, History of pressure wounds, Weeks Of Treatment: 18 History: Rheumatoid Arthritis, Paraplegia, Confinement Anxiety Rodriges, Jullien B ([id number]) [id number] Page 9 of 10 History: Rheumatoid Arthritis, Paraplegia, Confinement Anxiety Clustered Wound: No Wound Measurements Length: (cm) 1.5 Width: (cm) 0.8 Depth: (cm) 0.4 Area: (cm) 0.942 Volume: (cm) 0.377 % Reduction in Area: 82.9% % Reduction in Volume: 93.1% Tunneling: No Undermining: Yes Starting Position (o'clock): 10 Ending Position (o'clock): 11 Maximum Distance: (cm) 0.2 Wound Description Classification: Category/Stage III Exudate Amount: Medium Exudate Type: Serosanguineous Exudate Color: red, brown Treatment Notes Wound #8 (Gluteus) Wound Laterality: Left Cleanser Soap and Water Discharge Instruction: Gently cleanse wound with antibacterial soap, rinse and pat dry prior to dressing wounds Peri-Wound Care Topical Primary Dressing Silvercel Small 2x2 (in/in) Discharge Instruction: Apply Silvercel Small 2x2 (in/in) as instructed Secondary Dressing (BORDER) Zetuvit Plus SILICONE BORDER Dressing  5x5 (in/in) Discharge Instruction: Please do not put silicone bordered dressings under wraps. Use non-bordered dressing only. Secured With Compression Wrap Compression Stockings Environmental education officer) Signed: 02/24/2022 4:59:20 PM By: Rosalio Loud MSN RN CNS WTA Entered By: Rosalio Loud on 02/24/2022 15:14:10 --------------------------------------------------------------------------------  Vitals Details Patient Name: Date of Service: MUAAZ, BRAU 02/24/2022 2:30 PM Medical Record Number: [id number] Patient Account Number: [id number] Date of Birth/Sex: Treating RN: 1965-09-09 (56 y.o. [name] Primary Care Reise Hietala: [name] Other Clinician: Referring Kamuela Magos: Treating Aubriel Khanna/Extender: [name] in Treatment: 18 Vital Signs Time Taken: 14:47 Temperature (F): 98.0 FRANKEY, BOTTING B ([id number]) [id number] Page 10 of 10 Height (in): 73 Pulse (bpm): 90 Weight (lbs): 154 Respiratory Rate (breaths/min): 16 Body Mass Index (BMI): 20.3 Blood Pressure (mmHg): 95/68 Reference Range: 80 - 120 mg / dl Electronic Signature(s) Signed: 02/24/2022 4:59:20 PM By: Rosalio Loud MSN RN CNS WTA Entered By: Rosalio Loud on 02/24/2022 14:47:26

## 2022-02-24 NOTE — Progress Notes (Signed)
[name] ([id number]) [id number] Page 1 of 7 Visit Report for 02/24/2022 Chief Complaint Document Details Patient Name: Date of Service: [name], [name] 02/24/2022 2:30 PM Medical Record Number: [id number] Patient Account Number: [id number] Date of Birth/Sex: Treating RN: 11/01/1965 (56 y.o. [name] Primary Care Provider: Jani [name] Other Clinician: Referring Provider: Treating Provider/Extender: [name] in Treatment: 18 Information Obtained from: Patient Chief Complaint Sacral and back pressure ulcers Electronic Signature(s) Signed: 02/24/2022 3:09:49 PM By: Worthy Keeler PA-C Entered By: Worthy [name] on 02/24/2022 15:09:49 -------------------------------------------------------------------------------- HPI Details Patient Name: Date of Service: [name], [name]. 02/24/2022 2:30 PM Medical Record Number: [id number] Patient Account Number: [id number] Date of Birth/Sex: Treating RN: 1966-01-08 (56 y.o. [name] Primary Care Provider: Jani [name] Other Clinician: Referring Provider: Treating Provider/Extender: [name] in Treatment: 18 History of Present Illness ssociated Signs and Symptoms: Patient has a history of confirmed osteomyelitis of the left foot according to MRI July 2019. 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 HPI Description: 12/08/17 on evaluation today patient presents for initial evaluation and our clinic concerning issues he has been having with his feet bilaterally although the left more than right most recently. He was admitted to hospital where he was placed on IV vancomycin which actually he continue to utilize until discharge and even when discharged continue to be on until around 16 August according to what he tells me. His PICC line was removed at that point. Nonetheless he  has been seen in the wound care center in [facility] before transferring to [place] due to the fact that the doctor there left and they are no longer open. Nonetheless he does have significant osteomyelitis of the left foot noted on MRI which revealed significant issues with necrotic bone including a portion of the distal region of his left great toe which was felt to possibly either be missing as a result of amputation or potentially resorption. Either way he also had osteomyelitis noted of the digit, metatarsal region, cuneiform, and navicular bones. There is definite bone exposure noted externally at this point in time. In regard to the right foot he has issues with ulcerations here as well although he states that recently he's had no issues until this just blistered and reopened. Apparently Xeroform was used in this has caused things to be somewhat more moist and open more according to the patient. He's otherwise been tolerating the dressing changes without complication using silver alginate dressings. He has no discomfort but does seem to be extremely stressed regarding the fact that he did see Dr. 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 The patient stated per notes reviewed that he did not want to have any surgery and therefore has a repeat appointment scheduled with the surgeon on 12/15/17. With all that being said he feels like that he really has not been alerted to what was going on in the severity that was until just recently and has a lot of questions in that regard. I'll be see I explained that seeing him for the first time today I cannot answer a lot of those questions that he has. Colin, [name] ([id number]) [id number] Page 2 of 7 12/21/17 on evaluation today patient actually appears to be doing a little better in regard to the right foot in particular. There is one area where there  still definitive bone noted although  the MRI which I did review today that we had ordered was negative for any evidence of osteomyelitis which is good news. Nonetheless there was some necrotic bone noted. Overall the patient appears to be doing fairly well however in regard to the right foot. Unfortunately he does have a new open area on the posterior aspect of his left leg as well as the continued area of necrosis noted in the central portion of the foot. He states that he's actually going to be sent for reevaluation specifically to a limb salvage clinic at [facility] he believes this is something that is primary care provider has been working with. They were just awaiting the results of the MRI. 01/08/18 on evaluation today patient actually appears to be doing maybe just a little better in regard to the overall appearance of his bilateral foot ulcers. In general this does not seem to have worsened at least which is good news. He still has evidence of bone noted on the surface of both wound areas which though dry appearing really has not otherwise changed at this time. He actually has an appointment at [facility] with the wound center to discuss limb salvage. He discusses with me at the last visit. Nonetheless he was somewhat upset today about a couple things one being that he stated that we did not put the order in for the protective dressing for his knees that we discussed last time. With that being said I had put in the order for the protective dressing in fact it was on his order sheet that was signed on 12/21/17. Nonetheless unfortunately it appears this was overlooked by home health and they told him if they had the order they could put the protective dressing on. Nonetheless they told him they did not have the order. Again I believe this was an oversight nothing intentional on anyone's part nonetheless the order was there. Still I had explained to the patient sometimes insurance will not cover for the protective dressing even if it  is ordered and this was discussed in the last visit. 01/25/18 on evaluation today patient presents for follow-up concerning his bilateral lower extremity ulcers. He has actually been doing about the same since I last saw him. We do have them in the room we can look at his gluteal region today as well to ensure that there is nothing open or if so address the issue there. Nonetheless he was supposed to see you and see wound care/limb salvage prior to his visit with me today. Unfortunately when he arrived last time at their clinic he apparently was there on the wrong day the doctor was not even present that he was his to be seen. Nonetheless I had to be switched to a different day and the patient actually is going in the next week to see them. Assuming everything works out this should actually be tomorrow. Nonetheless if they take over care of that point then we will subsequently release care to them. Readmission: 10-15-2021 upon evaluation today patient appears to be doing somewhat poorly in regard to wounds over the left gluteal region. He also has areas in the midline sacrum although this is not quite as bad. There is however some evidence of necrotic bone noted at this point unfortunately. The question is whether this is just something working its way out or if it something that is actually actively infected at this point. We are going to have to evaluate that further  little by little as we go with additional diagnostics. With that being said the patient tells me currently that what we are seeing him for has been open since around 09-05-2021 for the gluteal area on the left and in regard to the sacrum 10-01-2021 he states. With that being said he again is not too significant as far as the overall appearance of the wounds are concerned but again with the bone exposed this is something that we may want to do at pathology and culture in regard to. He has previously been treated for osteomyelitis of the  sacral area. 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 This is a gentleman whom I have previously seen back in 2019. The wound that we were taking care of back at that time is completely cleared. 7/25; 2-week follow-up. The patient has 3 small open areas in the lower sacrum and then an area over the left ischial tuberosity. Both of these are stage III wounds. He has been using Hydrofera Blue. 11-12-2021 upon evaluation today patient appears to be doing well currently in regard to his wounds. The distal sacral area is almost completely closed and looks to be doing great. In regard to the left gluteal region this is showing signs of improvement from a depth perspective size is about the same. Overall I am extremely pleased with where things stand compared to what we were previous. 12-03-2021 upon evaluation today patient appears to be doing well currently in regard to his wound. He is actually showing signs of improvement the sacral region and the right ischial/gluteal region is completely healed. The left ischial/gluteal region is still open but seems to be doing much better. 12-24-2021 upon evaluation today patient appears to be doing well currently in regard to some of his wounds others have reopened or are not doing quite as well. Overall though I think that he is on a good track he just may be needs to be a little bit better on appropriate offloading we discussed that today as well. 01-27-2022 upon evaluation today patient appears to be doing well currently in regard to his wound. He is not showing any signs of significant tissue breakdown but unfortunately the wound is larger than what it was last time I saw him. He tells me that this seems to be a complete shock based on what he is hearing today. He tells me that his son was telling him this has been doing well and that it was smaller and not really bleeding or draining much. Again this is  definitely significantly larger than last time I saw him but does not appear to be obviously infected there is some undermining noted as well. In general I am unsure of what would have caused such a dramatic changes what he seems to feel has happened here but nonetheless indeed I do not feel like that this is doing nearly as well as last time I saw him. 11/6; his wound measured better today per our intake nurse using silver alginate and border foam although according to the patient were having trouble with supplies especially the border foam. He has home health. 02-24-2022 upon evaluation today patient appears to be doing better in regard to his wound. The sacral area is looking like it is trying to break down a little bit is not actually open right now but we definitely need to be careful in this regard. I discussed that with him today. With that being said  the patient tells me that he definitely is not changing positions as frequently as he should and I think that is the primary cause of what we are seeing here. Fortunately there does not appear to be any signs of infection locally or systemically at this time. No fevers, chills, nausea, vomiting, or diarrhea. Electronic Signature(s) Signed: 02/24/2022 3:18:17 PM By: Worthy Keeler PA-C Entered By: Worthy [name] on 02/24/2022 15:18:17 -------------------------------------------------------------------------------- Physical Exam Details Patient Name: Date of Service: [name], [name] 02/24/2022 2:30 PM Medical Record Number: [id number] Patient Account Number: [id number] Date of Birth/Sex: Treating RN: 11-29-1965 (56 y.o. Doree Barthel, Halvor [name] ([id number]) [id number] Page 3 of 7 Primary Care Provider: Jani [name] Other Clinician: Referring Provider: Treating Provider/Extender: [name] in Treatment: 28 Constitutional Well-nourished and well-hydrated in no acute  distress. Respiratory normal breathing without difficulty. Psychiatric this patient is able to make decisions and demonstrates good insight into disease process. Alert and Oriented x 3. pleasant and cooperative. Notes Upon inspection patient's wound bed actually showed signs of good granulation and epithelization at this point. Fortunately there does not appear to be any evidence of infection at this time which is great and overall I am extremely pleased with where we stand today. I do think that his wound is doing quite well and healing quite nicely. He does need to be very cautious about frequently changing positions I told him that he needed to really do this most likely every 2 hours. This will be best to keep from breaking down as far as his skin is concerned. Electronic Signature(s) Signed: 02/24/2022 3:19:36 PM By: Worthy Keeler PA-C Entered By: Worthy [name] on 02/24/2022 15:19:36 -------------------------------------------------------------------------------- Physician Orders Details Patient Name: Date of Service: [name], Caraway [name]. 02/24/2022 2:30 PM Medical Record Number: [id number] Patient Account Number: [id number] Date of Birth/Sex: Treating RN: 1966/01/02 (56 y.o. [name] Primary Care Provider: Jani [name] Other Clinician: Referring Provider: Treating Provider/Extender: [name] in Treatment: 18 Verbal / Phone Orders: No Diagnosis Coding ICD-10 Coding Code Description L89.153 Pressure ulcer of sacral region, stage 3 L89.893 Pressure ulcer of other site, stage 3 G82.54 Quadriplegia, C5-C7 incomplete Z93.1 Gastrostomy status Follow-up Appointments Return Appointment in 2 weeks. Nurse Visit as needed [place]: - [facility] for wound care. May utilize formulary equivalent dressing for wound treatment orders unless otherwise specified. Home Health Nurse may visit PRN to address patients  wound care needs. Scheduled days for dressing changes to be completed; exception, patient has scheduled wound care visit that day. **Please direct any NON-WOUND related issues/requests for orders to patient's Primary Care Physician. **If current dressing causes regression in wound condition, may D/C ordered dressing product/s and apply Normal Saline Moist Dressing daily until next [place] or Other MD appointment. **Notify [facility] of regression in wound condition at [phone]. Bathing/ [employer] wounds with antibacterial soap and water. May shower; gently cleanse wound with antibacterial soap, rinse and pat dry prior to dressing wounds No tub bath. Off-Loading DETRELL, UMSCHEID ([id number]) [id number] Page 4 of 7 Turn and reposition every 2 hours Other: - relive pressure from heels on bed Wound Treatment Wound #8 - Gluteus Wound Laterality: Left Cleanser: Soap and Water (Home Health) (Generic) 3 x Per Week/30 Days Discharge Instructions: Gently cleanse wound with antibacterial soap, rinse and pat dry prior to dressing wounds Prim Dressing: Silvercel Small 2x2 (in/in) (Home Health) (  Generic) 3 x Per Week/30 Days ary Discharge Instructions: Apply Silvercel Small 2x2 (in/in) as instructed Secondary Dressing: (BORDER) Zetuvit Plus SILICONE BORDER Dressing 5x5 (in/in) (Home Health) (Generic) 3 x Per Week/30 Days Discharge Instructions: Please do not put silicone bordered dressings under wraps. Use non-bordered dressing only. Electronic Signature(s) Signed: 02/24/2022 3:41:59 PM By: Rosalio Loud MSN RN CNS WTA Signed: 02/25/2022 8:14:16 AM By: Worthy Keeler PA-C Entered By: Rosalio Loud on 02/24/2022 15:41:59 -------------------------------------------------------------------------------- Problem List Details Patient Name: Date of Service: [name], Griffith [name]. 02/24/2022 2:30 PM Medical Record Number:  [id number] Patient Account Number: [id number] Date of Birth/Sex: Treating RN: 1966-03-18 (56 y.o. [name] Primary Care Provider: Jani [name] Other Clinician: Referring Provider: Treating Provider/Extender: [name] in Treatment: 18 Active Problems ICD-10 Encounter Code Description Active Date MDM Diagnosis L89.153 Pressure ulcer of sacral region, stage 3 10/15/2021 No Yes L89.893 Pressure ulcer of other site, stage 3 10/15/2021 No Yes G82.54 Quadriplegia, C5-C7 incomplete 10/15/2021 No Yes Z93.1 Gastrostomy status 10/15/2021 No Yes Inactive Problems Resolved Problems Electronic Signature(s) Signed: 02/24/2022 3:44:11 PM By: Rosalio Loud MSN RN CNS WTA Signed: 02/25/2022 8:14:16 AM By: Worthy Keeler PA-C Previous Signature: 02/24/2022 3:09:43 PM Version By: Worthy Keeler PA-C Entered By: Rosalio Loud on 02/24/2022 15:44:11 Oswaldo Milian, Adrian [name] ([id number]) [id number] Page 5 of 7 -------------------------------------------------------------------------------- Progress Note Details Patient Name: Date of Service: [name], [name] 02/24/2022 2:30 PM Medical Record Number: [id number] Patient Account Number: [id number] Date of Birth/Sex: Treating RN: [birth year] (56 y.o. [name] Primary Care Provider: Jani [name] Other Clinician: Referring Provider: Treating Provider/Extender: [name] in Treatment: 18 Subjective Chief Complaint Information obtained from Patient Sacral and back pressure ulcers History of Present Illness (HPI) The following HPI elements were documented for the patient's wound: Associated Signs and Symptoms: Patient has a history of confirmed osteomyelitis of the left foot according to MRI July 2019. 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 12/08/17 on evaluation today patient presents for initial evaluation and  our clinic concerning issues he has been having with his feet bilaterally although the left more than right most recently. He was admitted to hospital where he was placed on IV vancomycin which actually he continue to utilize until discharge and even when discharged continue to be on until around 16 August according to what he tells me. His PICC line was removed at that point. Nonetheless he has been seen in the wound care center in [facility] before transferring to [place] due to the fact that the doctor there left and they are no longer open. Nonetheless he does have significant osteomyelitis of the left foot noted on MRI which revealed significant issues with necrotic bone including a portion of the distal region of his left great toe which was felt to possibly either be missing as a result of amputation or potentially resorption. Either way he also had osteomyelitis noted of the digit, metatarsal region, cuneiform, and navicular bones. There is definite bone exposure noted externally at this point in time. In regard to the right foot he has issues with ulcerations here as well although he states that recently he's had no issues until this just blistered and reopened. Apparently Xeroform was used in this has caused things to be somewhat more moist and open more according to the patient. He's otherwise been tolerating the dressing changes without complication using silver alginate dressings.  He has no discomfort but does seem to be extremely stressed regarding the fact that he did see Dr. 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 The patient stated per notes reviewed that he did not want to have any surgery and therefore has a repeat appointment scheduled with the surgeon on 12/15/17. With all that being said he feels like that he really has not been alerted to what was going on in the severity that was until just recently and has a lot of questions in that regard.  I'll be see I explained that seeing him for the first time today I cannot answer a lot of those questions that he has. 12/21/17 on evaluation today patient actually appears to be doing a little better in regard to the right foot in particular. There is one area where there still definitive bone noted although the MRI which I did review today that we had ordered was negative for any evidence of osteomyelitis which is good news. Nonetheless there was some necrotic bone noted. Overall the patient appears to be doing fairly well however in regard to the right foot. Unfortunately he does have a new open area on the posterior aspect of his left leg as well as the continued area of necrosis noted in the central portion of the foot. He states that he's actually going to be sent for reevaluation specifically to a limb salvage clinic at [facility] he believes this is something that is primary care provider has been working with. They were just awaiting the results of the MRI. 01/08/18 on evaluation today patient actually appears to be doing maybe just a little better in regard to the overall appearance of his bilateral foot ulcers. In general this does not seem to have worsened at least which is good news. He still has evidence of bone noted on the surface of both wound areas which though dry appearing really has not otherwise changed at this time. He actually has an appointment at [facility] with the wound center to discuss limb salvage. He discusses with me at the last visit. Nonetheless he was somewhat upset today about a couple things one being that he stated that we did not put the order in for the protective dressing for his knees that we discussed last time. With that being said I had put in the order for the protective dressing in fact it was on his order sheet that was signed on 12/21/17. Nonetheless unfortunately it appears this was overlooked by home health and they told him if they had the order  they could put the protective dressing on. Nonetheless they told him they did not have the order. Again I believe this was an oversight nothing intentional on anyone's part nonetheless the order was there. Still I had explained to the patient sometimes insurance will not cover for the protective dressing even if it is ordered and this was discussed in the last visit. 01/25/18 on evaluation today patient presents for follow-up concerning his bilateral lower extremity ulcers. He has actually been doing about the same since I last saw him. We do have them in the room we can look at his gluteal region today as well to ensure that there is nothing open or if so address the issue there. Nonetheless he was supposed to see you and see wound care/limb salvage prior to his visit with me today. Unfortunately when he arrived last time at their clinic he apparently was there on the  wrong day the doctor was not even present that he was his to be seen. Nonetheless I had to be switched to a different day and the patient actually is going in the next week to see them. Assuming everything works out this should actually be tomorrow. Nonetheless if they take over care of that point then we will subsequently release care to them. Readmission: 10-15-2021 upon evaluation today patient appears to be doing somewhat poorly in regard to wounds over the left gluteal region. He also has areas in the midline sacrum although this is not quite as bad. There is however some evidence of necrotic bone noted at this point unfortunately. The question is whether this is just something working its way out or if it something that is actually actively infected at this point. We are going to have to evaluate that further little by little as we go with additional diagnostics. With that being said the patient tells me currently that what we are seeing him for has been open since around 09-05-2021 for the gluteal area on the left and in regard to  the sacrum 10-01-2021 he states. With that being said he again is not too significant as far as the overall appearance of the wounds are concerned but again with the bone exposed this is something that we may want to do at pathology and culture in regard to. 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 This is a gentleman whom I have previously seen back in 2019. The wound that we were taking care of back at that time is completely cleared. 7/25; 2-week follow-up. The patient has 3 small open areas in the lower sacrum and then an area over the left ischial tuberosity. Both of these are stage III [name], [name] ([id number]) [id number] Page 6 of 7 wounds. He has been using Hydrofera Blue. 11-12-2021 upon evaluation today patient appears to be doing well currently in regard to his wounds. The distal sacral area is almost completely closed and looks to be doing great. In regard to the left gluteal region this is showing signs of improvement from a depth perspective size is about the same. Overall I am extremely pleased with where things stand compared to what we were previous. 12-03-2021 upon evaluation today patient appears to be doing well currently in regard to his wound. He is actually showing signs of improvement the sacral region and the right ischial/gluteal region is completely healed. The left ischial/gluteal region is still open but seems to be doing much better. 12-24-2021 upon evaluation today patient appears to be doing well currently in regard to some of his wounds others have reopened or are not doing quite as well. Overall though I think that he is on a good track he just may be needs to be a little bit better on appropriate offloading we discussed that today as well. 01-27-2022 upon evaluation today patient appears to be doing well currently  in regard to his wound. He is not showing any signs of significant tissue breakdown but unfortunately the wound is larger than what it was last time I saw him. He tells me that this seems to be a complete shock based on what he is hearing today. He tells me that his son was telling him this has been doing well and that it was smaller and not really bleeding or draining much. Again this is definitely significantly  larger than last time I saw him but does not appear to be obviously infected there is some undermining noted as well. In general I am unsure of what would have caused such a dramatic changes what he seems to feel has happened here but nonetheless indeed I do not feel like that this is doing nearly as well as last time I saw him. 11/6; his wound measured better today per our intake nurse using silver alginate and border foam although according to the patient were having trouble with supplies especially the border foam. He has home health. 02-24-2022 upon evaluation today patient appears to be doing better in regard to his wound. The sacral area is looking like it is trying to break down a little bit is not actually open right now but we definitely need to be careful in this regard. I discussed that with him today. With that being said the patient tells me that he definitely is not changing positions as frequently as he should and I think that is the primary cause of what we are seeing here. Fortunately there does not appear to be any signs of infection locally or systemically at this time. No fevers, chills, nausea, vomiting, or diarrhea. Objective Constitutional Well-nourished and well-hydrated in no acute distress. Vitals Time Taken: 2:47 PM, Height: 73 in, Weight: 154 lbs, BMI: 20.3, Temperature: 98.0 F, Pulse: 90 bpm, Respiratory Rate: 16 breaths/min, Blood Pressure: 95/68 mmHg. Respiratory normal breathing without difficulty. Psychiatric this patient is able to make decisions and  demonstrates good insight into disease process. Alert and Oriented x 3. pleasant and cooperative. General Notes: Upon inspection patient's wound bed actually showed signs of good granulation and epithelization at this point. Fortunately there does not appear to be any evidence of infection at this time which is great and overall I am extremely pleased with where we stand today. I do think that his wound is doing quite well and healing quite nicely. He does need to be very cautious about frequently changing positions I told him that he needed to really do this most likely every 2 hours. This will be best to keep from breaking down as far as his skin is concerned. Integumentary (Hair, Skin) Wound #8 status is Open. Original cause of wound was Pressure Injury. The date acquired was: 09/05/2021. The wound has been in treatment 18 weeks. The wound is located on the Left Gluteus. The wound measures 1.5cm length x 0.8cm width x 0.4cm depth; 0.942cm^2 area and 0.377cm^3 volume. There is Fat Layer (Subcutaneous Tissue) exposed. There is a medium amount of serosanguineous drainage noted. The wound margin is well defined and not attached to the wound base. There is small (1-33%) red, pink granulation within the wound bed. There is a small (1-33%) amount of necrotic tissue within the wound bed. Wound #8 status is Open. Original cause of wound was Pressure Injury. The date acquired was: 09/05/2021. The wound has been in treatment 18 weeks. The wound is located on the Left Gluteus. The wound measures 1.5cm length x 0.8cm width x 0.4cm depth; 0.942cm^2 area and 0.377cm^3 volume. There is no tunneling noted, however, there is undermining starting at 10:00 and ending at 11:00 with a maximum distance of 0.2cm. There is a medium amount of serosanguineous drainage noted. Assessment Active Problems ICD-10 Pressure ulcer of sacral region, stage 3 Pressure ulcer of other site, stage 3 Quadriplegia, C5-C7  incomplete Gastrostomy status Plan [name], [name] ([id number]) [id number] Page 7 of 7 1. Based on what I  am seeing currently I do think we continue with the silver alginate dressing I think that is doing quite well. 2. I am also can recommend that we have the patient continue to monitor for any signs of worsening or infection. Obviously if anything changes he can contact the office and let me know. 3. I am also going to suggest that the patient should continue to offload and change in position every 2 hours I think this can be of utmost importance. He tells me he is not doing this quite enough and he knows that is probably his problem. We will see patient back for reevaluation in 1 week here in the clinic. If anything worsens or changes patient will contact our office for additional recommendations. Electronic Signature(s) Signed: 02/24/2022 3:20:12 PM By: Worthy Keeler PA-C Entered By: Worthy [name] on 02/24/2022 15:20:11 -------------------------------------------------------------------------------- SuperBill Details Patient Name: Date of Service: [name], [place] 02/24/2022 Medical Record Number: [id number] Patient Account Number: [id number] Date of Birth/Sex: Treating RN: [birth year] (56 y.o. [name] Primary Care Provider: Jani [name] Other Clinician: Referring Provider: Treating Provider/Extender: [name] in Treatment: 18 Diagnosis Coding ICD-10 Codes Code Description [phone] Pressure ulcer of sacral region, stage 3 L89.893 Pressure ulcer of other site, stage 3 G82.54 Quadriplegia, C5-C7 incomplete Z93.1 Gastrostomy status Facility Procedures : CPT4 Code: [id number] Description: 99213 - WOUND CARE VISIT-LEV 3 EST PT Modifier: Quantity: 1 Physician Procedures : CPT4 Code Description Modifier [id number] - WC PHYS LEVEL 3 - EST PT ICD-10 Diagnosis Description L89.153 Pressure ulcer of sacral region,  stage 3 L89.893 Pressure ulcer of other site, stage 3 G82.54 Quadriplegia, C5-C7 incomplete Z93.1 Gastrostomy  status Quantity: 1 Electronic Signature(s) Signed: 02/24/2022 3:43:34 PM By: Rosalio Loud MSN RN CNS WTA Signed: 02/25/2022 8:14:16 AM By: Worthy Keeler PA-C Previous Signature: 02/24/2022 3:20:25 PM Version By: Worthy Keeler PA-C Entered By: Rosalio Loud on 02/24/2022 15:43:34

## 2022-03-11 ENCOUNTER — Encounter: Payer: Medicare Other | Attending: Physician Assistant | Admitting: Physician Assistant

## 2022-03-11 DIAGNOSIS — E46 Unspecified protein-calorie malnutrition: Secondary | ICD-10-CM | POA: Diagnosis not present

## 2022-03-11 DIAGNOSIS — L89153 Pressure ulcer of sacral region, stage 3: Secondary | ICD-10-CM | POA: Insufficient documentation

## 2022-03-11 DIAGNOSIS — L89893 Pressure ulcer of other site, stage 3: Secondary | ICD-10-CM | POA: Diagnosis present

## 2022-03-11 DIAGNOSIS — F419 Anxiety disorder, unspecified: Secondary | ICD-10-CM | POA: Diagnosis not present

## 2022-03-11 DIAGNOSIS — G8254 Quadriplegia, C5-C7 incomplete: Secondary | ICD-10-CM | POA: Insufficient documentation

## 2022-03-11 DIAGNOSIS — Z931 Gastrostomy status: Secondary | ICD-10-CM | POA: Insufficient documentation

## 2022-03-11 DIAGNOSIS — M069 Rheumatoid arthritis, unspecified: Secondary | ICD-10-CM | POA: Insufficient documentation

## 2022-03-11 NOTE — Progress Notes (Signed)
[name], CHEEK B ([id number]) [id number] Page 1 of 8 Visit Report for 03/11/2022 Arrival Information Details Patient Name: Date of Service: [name], [name] 03/11/2022 2:30 PM Medical Record Number: [id number] Patient Account Number: [id number] Date of Birth/Sex: Treating RN: [birth year] (56 y.o. [name] Primary Care Jamori Biggar: [name] Other Clinician: Referring Lashara Urey: Treating Rosaire Cueto/Extender: [name] in Treatment: 21 Visit Information History Since Last Visit Added or deleted any medications: No Patient Arrived: Wheel Chair Any new allergies or adverse reactions: No Arrival Time: 15:10 Had a fall or experienced change in No Accompanied By: self activities of daily living that may affect Transfer Assistance: Harrel Lemon Lift risk of falls: Patient Identification Verified: Yes Signs or symptoms of abuse/neglect since last visito No Secondary Verification Process Completed: Yes Hospitalized since last visit: No Patient Requires Transmission-Based Precautions: No Implantable device outside of the clinic excluding No Patient Has Alerts: No cellular tissue based products placed in the center since last visit: Pain Present Now: No Electronic Signature(s) Signed: 03/11/2022 5:09:56 PM By: Rosalio Loud MSN RN CNS WTA Entered By: Rosalio Loud on 03/11/2022 15:10:38 -------------------------------------------------------------------------------- Clinic Level of Care Assessment Details Patient Name: Date of Service: Colin, [name] 03/11/2022 2:30 PM Medical Record Number: [id number] Patient Account Number: [id number] Date of Birth/Sex: Treating RN: March 12, 1966 (56 y.o. [name] Primary Care Evanna Washinton: [name] Other Clinician: Referring Rhiley Solem: Treating Jerrilyn Messinger/Extender: [name] in Treatment: 21 Clinic Level of Care Assessment Items TOOL 4 Quantity Score X- 1 0 Use when only  an EandM is performed on FOLLOW-UP visit ASSESSMENTS - Nursing Assessment / Reassessment X- 1 10 Reassessment of Co-morbidities (includes updates in patient status) X- 1 5 Reassessment of Adherence to Treatment Plan ASSESSMENTS - Wound and Skin Assessment / Reassessment DEREL, MCGLASSON B ([id number]) [id number] Page 2 of 8 X- 1 5 Simple Wound Assessment / Reassessment - one wound '[]'$  - 0 Complex Wound Assessment / Reassessment - multiple wounds '[]'$  - 0 Dermatologic / Skin Assessment (not related to wound area) ASSESSMENTS - Focused Assessment '[]'$  - 0 Circumferential Edema Measurements - multi extremities '[]'$  - 0 Nutritional Assessment / Counseling / Intervention '[]'$  - 0 Lower Extremity Assessment (monofilament, tuning fork, pulses) '[]'$  - 0 Peripheral Arterial Disease Assessment (using hand held doppler) ASSESSMENTS - Ostomy and/or Continence Assessment and Care '[]'$  - 0 Incontinence Assessment and Management '[]'$  - 0 Ostomy Care Assessment and Management (repouching, etc.) PROCESS - Coordination of Care '[]'$  - 0 Simple Patient / Family Education for ongoing care '[]'$  - 0 Complex (extensive) Patient / Family Education for ongoing care X- 1 10 Staff obtains Programmer, systems, Records, T Results / Process Orders est '[]'$  - 0 Staff telephones HHA, Nursing Homes / Clarify orders / etc '[]'$  - 0 Routine Transfer to another Facility (non-emergent condition) '[]'$  - 0 Routine Hospital Admission (non-emergent condition) '[]'$  - 0 New Admissions / Biomedical engineer / Ordering NPWT Apligraf, etc. , '[]'$  - 0 Emergency Hospital Admission (emergent condition) '[]'$  - 0 Simple Discharge Coordination X- 1 15 Complex (extensive) Discharge Coordination PROCESS - Special Needs '[]'$  - 0 Pediatric / Minor Patient Management '[]'$  - 0 Isolation Patient Management '[]'$  - 0 Hearing / Language / Visual special needs '[]'$  - 0 Assessment of Community assistance (transportation, D/C planning,  etc.) '[]'$  - 0 Additional assistance / Altered mentation '[]'$  - 0 Support Surface(s) Assessment (bed, cushion, seat, etc.) INTERVENTIONS - Wound Cleansing / Measurement X - Simple Wound Cleansing - one wound 1  5 '[]'$  - 0 Complex Wound Cleansing - multiple wounds X- 1 5 Wound Imaging (photographs - any number of wounds) '[]'$  - 0 Wound Tracing (instead of photographs) X- 1 5 Simple Wound Measurement - one wound '[]'$  - 0 Complex Wound Measurement - multiple wounds INTERVENTIONS - Wound Dressings '[]'$  - 0 Small Wound Dressing one or multiple wounds X- 1 15 Medium Wound Dressing one or multiple wounds '[]'$  - 0 Large Wound Dressing one or multiple wounds '[]'$  - 0 Application of Medications - topical '[]'$  - 0 Application of Medications - injection INTERVENTIONS - Miscellaneous '[]'$  - 0 External ear exam '[]'$  - 0 Specimen Collection (cultures, biopsies, blood, body fluids, etc.) Zhong, Antino B ([id number]) [id number] Page 3 of 8 '[]'$  - 0 Specimen(s) / Culture(s) sent or taken to Lab for analysis '[]'$  - 0 Patient Transfer (multiple staff / Harrel Lemon Lift / Similar devices) '[]'$  - 0 Simple Staple / Suture removal (25 or less) '[]'$  - 0 Complex Staple / Suture removal (26 or more) '[]'$  - 0 Hypo / Hyperglycemic Management (close monitor of Blood Glucose) '[]'$  - 0 Ankle / Brachial Index (ABI) - do not check if billed separately X- 1 5 Vital Signs Has the patient been seen at the hospital within the last three years: Yes Total Score: 80 Level Of Care: New/Established - Level 3 Electronic Signature(s) Signed: 03/11/2022 5:09:56 PM By: Rosalio Loud MSN RN CNS WTA Entered By: Rosalio Loud on 03/11/2022 15:16:42 -------------------------------------------------------------------------------- Encounter Discharge Information Details Patient Name: Date of Service: [name], Colin B. 03/11/2022 2:30 PM Medical Record Number: [id number] Patient Account Number: [id number] Date of  Birth/Sex: Treating RN: [birth year] (56 y.o. [name] Primary Care Kenyia Wambolt: [name] Other Clinician: Referring Kolyn Rozario: Treating Charidy Cappelletti/Extender: [name] in Treatment: 21 Encounter Discharge Information Items Discharge Condition: Stable Ambulatory Status: Wheelchair Discharge Destination: Home Transportation: Private Auto Accompanied By: self Schedule Follow-up Appointment: Yes Clinical Summary of Care: Electronic Signature(s) Signed: 03/11/2022 5:09:56 PM By: Rosalio Loud MSN RN CNS WTA Entered By: Rosalio Loud on 03/11/2022 15:26:48 -------------------------------------------------------------------------------- Lower Extremity Assessment Details Patient Name: Date of Service: [name], Ossie B. 03/11/2022 2:30 PM Medical Record Number: [id number] Patient Account Number: [id number] XZAYVIER, FAGIN ([id number]) [phone] Page 4 of 8 Date of Birth/Sex: Treating RN: [birth year] (56 y.o. [name] Primary Care Gaby Harney: [name] Other Clinician: Referring Egbert Seidel: Treating Wealthy Danielski/Extender: [name] in Treatment: 21 Electronic Signature(s) Signed: 03/11/2022 4:43:25 PM By: Rosalio Loud MSN RN CNS WTA Entered By: Rosalio Loud on 03/11/2022 16:43:24 -------------------------------------------------------------------------------- Multi-Disciplinary Care Plan Details Patient Name: Date of Service: [name], [place] B. 03/11/2022 2:30 PM Medical Record Number: [id number] Patient Account Number: [id number] Date of Birth/Sex: Treating RN: 1965/04/30 (56 y.o. [name] Primary Care Jonice Cerra: [name] Other Clinician: Referring Morgon Pamer: Treating Loray Akard/Extender: [name] in Treatment: 21 Active Inactive Pressure Nursing Diagnoses: Knowledge deficit related to causes and risk factors for pressure ulcer development Knowledge deficit related to management  of pressures ulcers Potential for impaired tissue integrity related to pressure, friction, moisture, and shear Goals: Patient will remain free from development of additional pressure ulcers Date Initiated: 12/03/2021 Target Resolution Date: 12/03/2021 Goal Status: Active Patient/caregiver will verbalize risk factors for pressure ulcer development Date Initiated: 12/03/2021 Target Resolution Date: 12/03/2021 Goal Status: Active Patient/caregiver will verbalize understanding of pressure ulcer management Date Initiated: 12/03/2021 Target Resolution Date: 12/03/2021 Goal Status: Active Interventions: Assess: immobility, friction, shearing, incontinence upon admission and  as needed Assess offloading mechanisms upon admission and as needed Assess potential for pressure ulcer upon admission and as needed Provide education on pressure ulcers Notes: Wound/Skin Impairment Nursing Diagnoses: Impaired tissue integrity Knowledge deficit related to ulceration/compromised skin integrity Goals: Ulcer/skin breakdown will have a volume reduction of 30% by week 4 Date Initiated: 10/15/2021 Target Resolution Date: 11/12/2021 Goal Status: Active Ulcer/skin breakdown will have a volume reduction of 50% by week 8 Date Initiated: 10/15/2021 Target Resolution Date: 12/10/2021 Goal Status: JASN, XIA ([id number]) [id number] Page 5 of 8 Ulcer/skin breakdown will have a volume reduction of 80% by week 12 Date Initiated: 10/15/2021 Target Resolution Date: 01/07/2022 Goal Status: Active Ulcer/skin breakdown will heal within 14 weeks Date Initiated: 10/15/2021 Target Resolution Date: 01/21/2022 Goal Status: Active Interventions: Assess patient/caregiver ability to obtain necessary supplies Assess patient/caregiver ability to perform ulcer/skin care regimen upon admission and as needed Assess ulceration(s) every visit Provide education on ulcer and skin  care Notes: Electronic Signature(s) Signed: 03/11/2022 5:09:56 PM By: Rosalio Loud MSN RN CNS WTA Entered By: Rosalio Loud on 03/11/2022 15:26:24 -------------------------------------------------------------------------------- Pain Assessment Details Patient Name: Date of Service: [name], Wake B. 03/11/2022 2:30 PM Medical Record Number: [id number] Patient Account Number: [id number] Date of Birth/Sex: Treating RN: 09-06-1965 (56 y.o. [name] Primary Care Andrae Claunch: [name] Other Clinician: Referring Rina Adney: Treating Pantera Winterrowd/Extender: [name] in Treatment: 21 Active Problems Location of Pain Severity and Description of Pain Patient Has Paino No Site Locations Pain Management and Medication Current Pain Management: Electronic Signature(s) Signed: 03/11/2022 5:09:56 PM By: Rosalio Loud MSN RN CNS WTA Entered By: Rosalio Loud on 03/11/2022 15:11:17 [name] ([id number]) [id number] Page 6 of 8 -------------------------------------------------------------------------------- Patient/Caregiver Education Details Patient Name: Date of Service: CROCKETT, RALLO 12/5/2023andnbsp2:30 PM Medical Record Number: [id number] Patient Account Number: [id number] Date of Birth/Gender: Treating RN: 12-31-1965 (56 y.o. [name] Primary Care Physician: [name] Other Clinician: Referring Physician: Treating Physician/Extender: [name] in Treatment: 21 Education Assessment Education Provided To: Patient Education Topics Provided Wound/Skin Impairment: Handouts: Caring for Your Ulcer Methods: Explain/Verbal Responses: State content correctly Electronic Signature(s) Signed: 03/11/2022 5:09:56 PM By: Rosalio Loud MSN RN CNS WTA Entered By: Rosalio Loud on 03/11/2022 15:17:51 -------------------------------------------------------------------------------- Wound Assessment  Details Patient Name: Date of Service: [name], Henley B. 03/11/2022 2:30 PM Medical Record Number: [id number] Patient Account Number: [id number] Date of Birth/Sex: Treating RN: 13-Jul-1965 (56 y.o. [name] Primary Care Rashon Westrup: [name] Other Clinician: Referring Robertson Colclough: Treating Inell Mimbs/Extender: [name] in Treatment: 21 Wound Status Wound Number: 8 Primary Pressure Ulcer Etiology: Wound Location: Left Gluteus Wound Open Wounding Event: Pressure Injury Status: Date Acquired: 09/05/2021 Comorbid Hypotension, Myocardial Infarction, History of pressure wounds, Weeks Of Treatment: 21 History: Rheumatoid Arthritis, Paraplegia, Confinement Anxiety Clustered Wound: No Photos ASHYR, HEDGEPATH B ([id number]) [id number] Page 7 of 8 Wound Measurements Length: (cm) 1.9 Width: (cm) 0.9 Depth: (cm) 0.4 Area: (cm) 1.343 Volume: (cm) 0.537 % Reduction in Area: 75.6% % Reduction in Volume: 90.2% Wound Description Classification: Category/Stage III Exudate Amount: Medium Exudate Type: Serosanguineous Exudate Color: red, brown Treatment Notes Wound #8 (Gluteus) Wound Laterality: Left Cleanser Soap and Water Discharge Instruction: Gently cleanse wound with antibacterial soap, rinse and pat dry prior to dressing wounds Peri-Wound Care Topical Primary Dressing Secondary Dressing (BORDER) Zetuvit Plus SILICONE BORDER Dressing 5x5 (in/in) Discharge Instruction: Please do not put silicone bordered dressings under wraps. Use non-bordered dressing only.  Secured With Compression Wrap Compression Stockings Environmental education officer) Signed: 03/11/2022 5:09:56 PM By: Rosalio Loud MSN RN CNS WTA Entered By: Rosalio Loud on 03/11/2022 15:19:51 -------------------------------------------------------------------------------- Vitals Details Patient Name: Date of Service: [name], Tell B. 03/11/2022 2:30 PM Medical  Record Number: [id number] Patient Account Number: [id number] Date of Birth/Sex: Treating RN: [birth year] (56 y.o. [name] Primary Care Marvens Hollars: [name] Other Clinician: Referring Owin Vignola: Treating Caidan Hubbert/Extender: Bobbie Stack Bradley, Rylend B ([id number]) [id number] Page 8 of 8 Weeks in Treatment: 21 Vital Signs Time Taken: 15:10 Temperature (F): 97.7 Height (in): 73 Pulse (bpm): 90 Weight (lbs): 154 Respiratory Rate (breaths/min): 18 Body Mass Index (BMI): 20.3 Blood Pressure (mmHg): 108/71 Reference Range: 80 - 120 mg / dl Electronic Signature(s) Signed: 03/11/2022 5:09:56 PM By: Rosalio Loud MSN RN CNS WTA Entered By: Rosalio Loud on 03/11/2022 15:11:10

## 2022-03-11 NOTE — Progress Notes (Signed)
[name] ([id number]) [id number] Page 1 of 8 Visit Report for 03/11/2022 Chief Complaint Document Details Patient Name: Date of Service: [name], [name] 03/11/2022 2:30 PM Medical Record Number: [id number] Patient Account Number: [id number] Date of Birth/Sex: Treating RN: Dec 18, 1965 (56 y.o. [name] Primary Care Provider: [name] Other Clinician: Referring Provider: Treating Provider/Extender: [name] in Treatment: 21 Information Obtained from: Patient Chief Complaint Sacral and back pressure ulcers Electronic Signature(s) Signed: 03/11/2022 3:10:09 PM By: Worthy Keeler PA-C Entered By: Worthy Keeler on 03/11/2022 15:10:09 -------------------------------------------------------------------------------- HPI Details Patient Name: Date of Service: [name],  [name]. 03/11/2022 2:30 PM Medical Record Number: [id number] Patient Account Number: [id number] Date of Birth/Sex: Treating RN: [birth year] (56 y.o. [name] Primary Care Provider: [name] Other Clinician: Referring Provider: Treating Provider/Extender: [name] in Treatment: 21 History of Present Illness ssociated Signs and Symptoms: Patient has a history of confirmed osteomyelitis of the left foot according to MRI July 2019. 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 HPI Description: 12/08/17 on evaluation today patient presents for initial evaluation and our clinic concerning issues he has been having with his feet bilaterally although the left more than right most recently. He was admitted to hospital where he was placed on IV vancomycin which actually he continue to utilize until discharge and even when discharged continue to be on until around 16 August according to what he tells me. His PICC line was removed at that point. Nonetheless he has  been seen in the wound care center in [facility] before transferring to [place] due to the fact that the doctor there left and they are no longer open. Nonetheless he does have significant osteomyelitis of the left foot noted on MRI which revealed significant issues with necrotic bone including a portion of the distal region of his left great toe which was felt to possibly either be missing as a result of amputation or potentially resorption. Either way he also had osteomyelitis noted of the digit, metatarsal region, cuneiform, and navicular bones. There is definite bone exposure noted externally at this point in time. In regard to the right foot he has issues with ulcerations here as well although he states that recently he's had no issues until this just blistered and reopened. Apparently Xeroform was used in this has caused things to be somewhat more moist and open more according to the patient. He's otherwise been tolerating the dressing changes without complication using silver alginate dressings. He has no discomfort but does seem to be extremely stressed regarding the fact that he did see Dr. 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 The patient stated per notes reviewed that he did not want to have any surgery and therefore has a repeat appointment scheduled with the surgeon on 12/15/17. With all that being said he feels like that he really has not been alerted to what was going on in the severity that was until just recently and has a lot of questions in that regard. I'll be see I explained that seeing him for the first time today I cannot answer a lot of those questions that he has. [name] ([id number]) [id number] Page 2 of 8 12/21/17 on evaluation today patient actually appears to be doing a little better in regard to the right foot in particular. There is one area where there  still definitive bone noted although the  MRI which I did review today that we had ordered was negative for any evidence of osteomyelitis which is good news. Nonetheless there was some necrotic bone noted. Overall the patient appears to be doing fairly well however in regard to the right foot. Unfortunately he does have a new open area on the posterior aspect of his left leg as well as the continued area of necrosis noted in the central portion of the foot. He states that he's actually going to be sent for reevaluation specifically to a limb salvage clinic at [facility] he believes this is something that is primary care provider has been working with. They were just awaiting the results of the MRI. 01/08/18 on evaluation today patient actually appears to be doing maybe just a little better in regard to the overall appearance of his bilateral foot ulcers. In general this does not seem to have worsened at least which is good news. He still has evidence of bone noted on the surface of both wound areas which though dry appearing really has not otherwise changed at this time. He actually has an appointment at [facility] with the wound center to discuss limb salvage. He discusses with me at the last visit. Nonetheless he was somewhat upset today about a couple things one being that he stated that we did not put the order in for the protective dressing for his knees that we discussed last time. With that being said I had put in the order for the protective dressing in fact it was on his order sheet that was signed on 12/21/17. Nonetheless unfortunately it appears this was overlooked by home health and they told him if they had the order they could put the protective dressing on. Nonetheless they told him they did not have the order. Again I believe this was an oversight nothing intentional on anyone's part nonetheless the order was there. Still I had explained to the patient sometimes insurance will not cover for the protective dressing even if it  is ordered and this was discussed in the last visit. 01/25/18 on evaluation today patient presents for follow-up concerning his bilateral lower extremity ulcers. He has actually been doing about the same since I last saw him. We do have them in the room we can look at his gluteal region today as well to ensure that there is nothing open or if so address the issue there. Nonetheless he was supposed to see you and see wound care/limb salvage prior to his visit with me today. Unfortunately when he arrived last time at their clinic he apparently was there on the wrong day the doctor was not even present that he was his to be seen. Nonetheless I had to be switched to a different day and the patient actually is going in the next week to see them. Assuming everything works out this should actually be tomorrow. Nonetheless if they take over care of that point then we will subsequently release care to them. Readmission: 10-15-2021 upon evaluation today patient appears to be doing somewhat poorly in regard to wounds over the left gluteal region. He also has areas in the midline sacrum although this is not quite as bad. There is however some evidence of necrotic bone noted at this point unfortunately. The question is whether this is just something working its way out or if it something that is actually actively infected at this point. We are going to have to evaluate that further  little by little as we go with additional diagnostics. With that being said the patient tells me currently that what we are seeing him for has been open since around 09-05-2021 for the gluteal area on the left and in regard to the sacrum 10-01-2021 he states. With that being said he again is not too significant as far as the overall appearance of the wounds are concerned but again with the bone exposed this is something that we may want to do at pathology and culture in regard to. He has previously been treated for osteomyelitis of the  sacral area. 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 This is a gentleman whom I have previously seen back in 2019. The wound that we were taking care of back at that time is completely cleared. 7/25; 2-week follow-up. The patient has 3 small open areas in the lower sacrum and then an area over the left ischial tuberosity. Both of these are stage III wounds. He has been using Hydrofera Blue. 11-12-2021 upon evaluation today patient appears to be doing well currently in regard to his wounds. The distal sacral area is almost completely closed and looks to be doing great. In regard to the left gluteal region this is showing signs of improvement from a depth perspective size is about the same. Overall I am extremely pleased with where things stand compared to what we were previous. 12-03-2021 upon evaluation today patient appears to be doing well currently in regard to his wound. He is actually showing signs of improvement the sacral region and the right ischial/gluteal region is completely healed. The left ischial/gluteal region is still open but seems to be doing much better. 12-24-2021 upon evaluation today patient appears to be doing well currently in regard to some of his wounds others have reopened or are not doing quite as well. Overall though I think that he is on a good track he just may be needs to be a little bit better on appropriate offloading we discussed that today as well. 01-27-2022 upon evaluation today patient appears to be doing well currently in regard to his wound. He is not showing any signs of significant tissue breakdown but unfortunately the wound is larger than what it was last time I saw him. He tells me that this seems to be a complete shock based on what he is hearing today. He tells me that his son was telling him this has been doing well and that it was smaller and not really bleeding or draining much. Again this is  definitely significantly larger than last time I saw him but does not appear to be obviously infected there is some undermining noted as well. In general I am unsure of what would have caused such a dramatic changes what he seems to feel has happened here but nonetheless indeed I do not feel like that this is doing nearly as well as last time I saw him. 11/6; his wound measured better today per our intake nurse using silver alginate and border foam although according to the patient were having trouble with supplies especially the border foam. He has home health. 02-24-2022 upon evaluation today patient appears to be doing better in regard to his wound. The sacral area is looking like it is trying to break down a little bit is not actually open right now but we definitely need to be careful in this regard. I discussed that with him today. With that being said  the patient tells me that he definitely is not changing positions as frequently as he should and I think that is the primary cause of what we are seeing here. Fortunately there does not appear to be any signs of infection locally or systemically at this time. No fevers, chills, nausea, vomiting, or diarrhea. 03-11-2022 upon evaluation today patient is actually making excellent improvement in general. I am extremely pleased with where things stand I do believe that he is headed on the right direction here. Electronic Signature(s) Signed: 03/11/2022 3:41:33 PM By: Worthy Keeler PA-C Entered By: Worthy Keeler on 03/11/2022 15:41:33 -------------------------------------------------------------------------------- Physical Exam Details Patient Name: Date of Service: [address] Mark[place] [name]. 03/11/2022 2:30 PM [name], [name] ([id number]) [id number] Page 3 of 8 Medical Record Number: [id number] Patient Account Number: [id number] Date of Birth/Sex: Treating RN: 1965/08/14 (56 y.o. [name] Primary Care  Provider: [name] Other Clinician: Referring Provider: Treating Provider/Extender: [name] in Treatment: 8 Constitutional Well-nourished and well-hydrated in no acute distress. Respiratory normal breathing without difficulty. Psychiatric this patient is able to make decisions and demonstrates good insight into disease process. Alert and Oriented x 3. pleasant and cooperative. Notes Upon inspection patient's wound bed actually showed signs of good granulation epithelization at this point. Fortunately I do not see any evidence of infection or worsening overall I do believe that he is making excellent progress. Electronic Signature(s) Signed: 03/11/2022 3:41:57 PM By: Worthy Keeler PA-C Entered By: Worthy Keeler on 03/11/2022 15:41:57 -------------------------------------------------------------------------------- Physician Orders Details Patient Name: Date of Service: [name], [place] [name]. 03/11/2022 2:30 PM Medical Record Number: [id number] Patient Account Number: [id number] Date of Birth/Sex: Treating RN: 1965/12/12 (56 y.o. [name] Primary Care Provider: [name] Other Clinician: Referring Provider: Treating Provider/Extender: [name] in Treatment: 21 Verbal / Phone Orders: No Diagnosis Coding ICD-10 Coding Code Description L89.153 Pressure ulcer of sacral region, stage 3 L89.893 Pressure ulcer of other site, stage 3 G82.54 Quadriplegia, C5-C7 incomplete Z93.1 Gastrostomy status Follow-up Appointments Return Appointment in 2 weeks. Nurse Visit as needed [place]: - [place] for wound care. May utilize formulary equivalent dressing for wound treatment orders unless otherwise specified. Home Health Nurse may visit PRN to address patients wound care needs. Scheduled days for dressing changes to be completed; exception, patient has scheduled wound care visit that  day. **Please direct any NON-WOUND related issues/requests for orders to patient's Primary Care Physician. **If current dressing causes regression in wound condition, may D/C ordered dressing product/s and apply Normal Saline Moist Dressing daily until next [place] or Other MD appointment. **Notify [facility] of regression in wound condition at [phone]. Bathing/ [employer] wounds with antibacterial soap and water. May shower; gently cleanse wound with antibacterial soap, rinse and pat dry prior to dressing wounds No tub bath. Off-Loading ROHIT, DELORIA ([id number]) [id number] Page 4 of 8 Turn and reposition every 2 hours Other: - relive pressure from heels on bed Wound Treatment Wound #8 - Gluteus Wound Laterality: Left Cleanser: Soap and Water (Generic) 3 x Per Week/30 Days Discharge Instructions: Gently cleanse wound with antibacterial soap, rinse and pat dry prior to dressing wounds Secondary Dressing: (BORDER) Zetuvit Plus SILICONE BORDER Dressing 5x5 (in/in) (DME) (Dispense As Written) 3 x Per Week/30 Days Discharge Instructions: Please do not put silicone bordered dressings under wraps. Use non-bordered dressing only. Electronic Signature(s) Signed: 03/11/2022 4:28:12 PM By: Joaquim Lai  Dixie Dials PA-C Signed: 03/11/2022 5:09:56 PM By: Rosalio Loud MSN RN CNS WTA Entered By: Rosalio Loud on 03/11/2022 15:26:18 -------------------------------------------------------------------------------- Problem List Details Patient Name: Date of Service: [name], [place] [name]. 03/11/2022 2:30 PM Medical Record Number: [id number] Patient Account Number: [id number] Date of Birth/Sex: Treating RN: 06-25-1965 (56 y.o. [name] Primary Care Provider: [name] Other Clinician: Referring Provider: Treating Provider/Extender: [name] in Treatment: 21 Active Problems ICD-10 Encounter Code Description  Active Date MDM Diagnosis L89.153 Pressure ulcer of sacral region, stage 3 10/15/2021 No Yes L89.893 Pressure ulcer of other site, stage 3 10/15/2021 No Yes G82.54 Quadriplegia, C5-C7 incomplete 10/15/2021 No Yes Z93.1 Gastrostomy status 10/15/2021 No Yes Inactive Problems Resolved Problems Electronic Signature(s) Signed: 03/11/2022 3:10:01 PM By: Worthy Keeler PA-C Entered By: Worthy Keeler on 03/11/2022 15:10:01 [name] ([id number]) [id number] Page 5 of 8 -------------------------------------------------------------------------------- Progress Note Details Patient Name: Date of Service: [name], [name] 03/11/2022 2:30 PM Medical Record Number: [id number] Patient Account Number: [id number] Date of Birth/Sex: Treating RN: 12-30-1965 (56 y.o. [name] Primary Care Provider: [name] Other Clinician: Referring Provider: Treating Provider/Extender: [name] in Treatment: 21 Subjective Chief Complaint Information obtained from Patient Sacral and back pressure ulcers History of Present Illness (HPI) The following HPI elements were documented for the patient's wound: Associated Signs and Symptoms: Patient has a history of confirmed osteomyelitis of the left foot according to MRI July 2019. 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 12/08/17 on evaluation today patient presents for initial evaluation and our clinic concerning issues he has been having with his feet bilaterally although the left more than right most recently. He was admitted to hospital where he was placed on IV vancomycin which actually he continue to utilize until discharge and even when discharged continue to be on until around 16 August according to what he tells me. His PICC line was removed at that point. Nonetheless he has been seen in the wound care center in [facility] before transferring to [place] due to the fact that the doctor there left and they are no longer open. Nonetheless he does have significant osteomyelitis of the left foot noted on MRI which revealed significant issues with necrotic bone including a portion of the distal region of his left great toe which was felt to possibly either be missing as a result of amputation or potentially resorption. Either way he also had osteomyelitis noted of the digit, metatarsal region, cuneiform, and navicular bones. There is definite bone exposure noted externally at this point in time. In regard to the right foot he has issues with ulcerations here as well although he states that recently he's had no issues until this just blistered and reopened. Apparently Xeroform was used in this has caused things to be somewhat more moist and open more according to the patient. He's otherwise been tolerating the dressing changes without complication using silver alginate dressings. He has no discomfort but does seem to be extremely stressed regarding the fact that he did see Dr. 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 The patient stated per notes reviewed that he did not want to have any surgery and therefore has a repeat appointment scheduled with the surgeon on 12/15/17. With all that being said he feels like that he really has not been alerted to what was going on  in the severity that was until just recently and has a lot of questions in that regard. I'll be see I explained that seeing him for the first time today I cannot answer a lot of those questions that he has. 12/21/17 on evaluation today patient actually appears to be doing a little better in regard to the right foot in particular. There is one area where there still definitive bone noted although the MRI which I did review today that we had ordered was negative for any evidence of osteomyelitis which is good news. Nonetheless there  was some necrotic bone noted. Overall the patient appears to be doing fairly well however in regard to the right foot. Unfortunately he does have a new open area on the posterior aspect of his left leg as well as the continued area of necrosis noted in the central portion of the foot. He states that he's actually going to be sent for reevaluation specifically to a limb salvage clinic at [facility] he believes this is something that is primary care provider has been working with. They were just awaiting the results of the MRI. 01/08/18 on evaluation today patient actually appears to be doing maybe just a little better in regard to the overall appearance of his bilateral foot ulcers. In general this does not seem to have worsened at least which is good news. He still has evidence of bone noted on the surface of both wound areas which though dry appearing really has not otherwise changed at this time. He actually has an appointment at [facility] with the wound center to discuss limb salvage. He discusses with me at the last visit. Nonetheless he was somewhat upset today about a couple things one being that he stated that we did not put the order in for the protective dressing for his knees that we discussed last time. With that being said I had put in the order for the protective dressing in fact it was on his order sheet that was signed on 12/21/17. Nonetheless unfortunately it appears this was overlooked by home health and they told him if they had the order they could put the protective dressing on. Nonetheless they told him they did not have the order. Again I believe this was an oversight nothing intentional on anyone's part nonetheless the order was there. Still I had explained to the patient sometimes insurance will not cover for the protective dressing even if it is ordered and this was discussed in the last visit. 01/25/18 on evaluation today patient presents for follow-up concerning his bilateral  lower extremity ulcers. He has actually been doing about the same since I last saw him. We do have them in the room we can look at his gluteal region today as well to ensure that there is nothing open or if so address the issue there. Nonetheless he was supposed to see you and see wound care/limb salvage prior to his visit with me today. Unfortunately when he arrived last time at their clinic he apparently was there on the wrong day the doctor was not even present that he was his to be seen. Nonetheless I had to be switched to a different day and the patient actually is going in the next week to see them. Assuming everything works out this should actually be tomorrow. Nonetheless if they take over care of that point then we will subsequently release care to them. Readmission: 10-15-2021 upon evaluation today patient appears to be doing somewhat poorly in regard  to wounds over the left gluteal region. He also has areas in the midline sacrum although this is not quite as bad. There is however some evidence of necrotic bone noted at this point unfortunately. The question is whether this is just something working its way out or if it something that is actually actively infected at this point. We are going to have to evaluate that further little by little as we go with additional diagnostics. With that being said the patient tells me currently that what we are seeing him for has been open since around 09-05-2021 for the gluteal area on the left and in regard to the sacrum 10-01-2021 he states. With that being said he again is not too significant as far as the overall appearance of the wounds are concerned but again with the bone exposed this is something that we may want to do at pathology and culture in regard to. 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 This is a gentleman whom I have  previously seen back in 2019. The wound that we were taking care of back at that time is completely cleared. 7/25; 2-week follow-up. The patient has 3 small open areas in the lower sacrum and then an area over the left ischial tuberosity. Both of these are stage III wounds. He has been using Hydrofera Blue. 11-12-2021 upon evaluation today patient appears to be doing well currently in regard to his wounds. The distal sacral area is almost completely closed and looks [name], [name] ([id number]) [id number] Page 6 of 8 to be doing great. In regard to the left gluteal region this is showing signs of improvement from a depth perspective size is about the same. Overall I am extremely pleased with where things stand compared to what we were previous. 12-03-2021 upon evaluation today patient appears to be doing well currently in regard to his wound. He is actually showing signs of improvement the sacral region and the right ischial/gluteal region is completely healed. The left ischial/gluteal region is still open but seems to be doing much better. 12-24-2021 upon evaluation today patient appears to be doing well currently in regard to some of his wounds others have reopened or are not doing quite as well. Overall though I think that he is on a good track he just may be needs to be a little bit better on appropriate offloading we discussed that today as well. 01-27-2022 upon evaluation today patient appears to be doing well currently in regard to his wound. He is not showing any signs of significant tissue breakdown but unfortunately the wound is larger than what it was last time I saw him. He tells me that this seems to be a complete shock based on what he is hearing today. He tells me that his son was telling him this has been doing well and that it was smaller and not really bleeding or draining much. Again this is definitely significantly larger than last time I saw him but  does not appear to be obviously infected there is some undermining noted as well. In general I am unsure of what would have caused such a dramatic changes what he seems to feel has happened here but nonetheless indeed I do not feel like that this is doing nearly as well as last time I saw him. 11/6; his wound measured better today per our intake nurse using silver alginate  and border foam although according to the patient were having trouble with supplies especially the border foam. He has home health. 02-24-2022 upon evaluation today patient appears to be doing better in regard to his wound. The sacral area is looking like it is trying to break down a little bit is not actually open right now but we definitely need to be careful in this regard. I discussed that with him today. With that being said the patient tells me that he definitely is not changing positions as frequently as he should and I think that is the primary cause of what we are seeing here. Fortunately there does not appear to be any signs of infection locally or systemically at this time. No fevers, chills, nausea, vomiting, or diarrhea. 03-11-2022 upon evaluation today patient is actually making excellent improvement in general. I am extremely pleased with where things stand I do believe that he is headed on the right direction here. Objective Constitutional Well-nourished and well-hydrated in no acute distress. Vitals Time Taken: 3:10 PM, Height: 73 in, Weight: 154 lbs, BMI: 20.3, Temperature: 97.7 F, Pulse: 90 bpm, Respiratory Rate: 18 breaths/min, Blood Pressure: 108/71 mmHg. Respiratory normal breathing without difficulty. Psychiatric this patient is able to make decisions and demonstrates good insight into disease process. Alert and Oriented x 3. pleasant and cooperative. General Notes: Upon inspection patient's wound bed actually showed signs of good granulation epithelization at this point. Fortunately I do not see  any evidence of infection or worsening overall I do believe that he is making excellent progress. Integumentary (Hair, Skin) Wound #8 status is Open. Original cause of wound was Pressure Injury. The date acquired was: 09/05/2021. The wound has been in treatment 21 weeks. The wound is located on the Left Gluteus. The wound measures 1.9cm length x 0.9cm width x 0.4cm depth; 1.343cm^2 area and 0.537cm^3 volume. There is a medium amount of serosanguineous drainage noted. Assessment Active Problems ICD-10 Pressure ulcer of sacral region, stage 3 Pressure ulcer of other site, stage 3 Quadriplegia, C5-C7 incomplete Gastrostomy status Plan Follow-up Appointments: Return Appointment in 2 weeks. Nurse Visit as needed Home Health: [facility]: [facility] for wound care. May utilize formulary equivalent dressing for wound treatment orders unless otherwise specified. Home Health Nurse may visit PRN to address patientoos wound care needs. Scheduled days for dressing changes to be completed; exception, patient has scheduled wound care visit that day. **Please direct any NON-WOUND related issues/requests for orders to patient's Primary Care Physician. **If current dressing causes regression in wound Gauntt, Jackie [name] ([id number]) [phone].pdf Page 7 of 8 condition, may D/C ordered dressing product/s and apply Normal Saline Moist Dressing daily until next [place] or Other MD appointment. **Notify [facility] of regression in wound condition at [phone]. Bathing/ Shower/ Hygiene: Wash wounds with antibacterial soap and water. May shower; gently cleanse wound with antibacterial soap, rinse and pat dry prior to dressing wounds No tub bath. Off-Loading: Turn and reposition every 2 hours Other: - relive pressure from heels on bed WOUND #8: - Gluteus Wound Laterality: Left Cleanser: Soap and Water (Generic) 3 x Per  Week/30 Days Discharge Instructions: Gently cleanse wound with antibacterial soap, rinse and pat dry prior to dressing wounds Secondary Dressing: (BORDER) Zetuvit Plus SILICONE BORDER Dressing 5x5 (in/in) (DME) (Dispense As Written) 3 x Per Week/30 Days Discharge Instructions: Please do not put silicone bordered dressings under wraps. Use non-bordered dressing only. 1. I am going to suggest that the patient continue  to offload appropriately he seems to have been doing a good job up to this point and very pleased and I am hopeful that we will continue to do well. 2. I am also can recommend that the patient should continue to utilize the silver alginate dressings followed by the Zetuvit which I think is also doing excellent here. 3. I would also suggest that he continue to try to keep off of the wounds is much as possible up to the more of this he can do the better in my opinion. We will see patient back for reevaluation in 1 week here in the clinic. If anything worsens or changes patient will contact our office for additional recommendations. Electronic Signature(s) Signed: 03/11/2022 3:42:32 PM By: Worthy Keeler PA-C Entered By: Worthy Keeler on 03/11/2022 15:42:32 -------------------------------------------------------------------------------- SuperBill Details Patient Name: Date of Service: [name], [place] 03/11/2022 Medical Record Number: [id number] Patient Account Number: [id number] Date of Birth/Sex: Treating RN: 11-16-1965 (56 y.o. [name] Primary Care Provider: [name] Other Clinician: Referring Provider: Treating Provider/Extender: [name] in Treatment: 21 Diagnosis Coding ICD-10 Codes Code Description [phone] Pressure ulcer of sacral region, stage 3 L89.893 Pressure ulcer of other site, stage 3 G82.54 Quadriplegia, C5-C7 incomplete Z93.1 Gastrostomy status Facility Procedures : CPT4 Code: [id number] Description: 99213 - WOUND CARE  VISIT-LEV 3 EST PT Modifier: Quantity: 1 Physician Procedures : CPT4 Code Description Modifier [id number] - WC PHYS LEVEL 3 - EST PT ICD-10 Diagnosis Description L89.153 Pressure ulcer of sacral region, stage 3 L89.893 Pressure ulcer of other site, stage 3 G82.54 Quadriplegia, C5-C7 incomplete Z93.1 Gastrostomy  status Ranes, Hatim [name] ([id number]) [id number] Page 8 of Quantity: 1 8 Electronic Signature(s) Signed: 03/11/2022 3:42:49 PM By: Worthy Keeler PA-C Entered By: Worthy Keeler on 03/11/2022 15:42:48

## 2022-03-25 ENCOUNTER — Encounter: Payer: Medicare Other | Admitting: Physician Assistant

## 2022-03-25 DIAGNOSIS — L89893 Pressure ulcer of other site, stage 3: Secondary | ICD-10-CM | POA: Diagnosis not present

## 2022-03-25 NOTE — Progress Notes (Addendum)
[name], [name] [name] ([id number]) [id number] Page 1 of 8 Visit Report for 03/25/2022 Chief Complaint Document Details Patient Name: Date of Service: [name], [name] 03/25/2022 2:30 PM Medical Record Number: [id number] Patient Account Number: [id number] Date of Birth/Sex: Treating RN: 23-Mar-1966 (56 y.o. [name] Primary Care Provider: [name] Other Clinician: Referring Provider: Treating Provider/Extender: [name] in Treatment: 23 Information Obtained from: Patient Chief Complaint Sacral and back pressure ulcers Electronic Signature(Colin) Signed: 03/25/2022 2:35:09 PM By: Worthy Keeler PA-C Entered By: Worthy Keeler on 03/25/2022 14:35:09 -------------------------------------------------------------------------------- HPI Details Patient Name: Date of Service: [name], [name]. 03/25/2022 2:30 PM Medical Record Number: [id number] Patient Account Number: [id number] Date of Birth/Sex: Treating RN: Jan 11, 1966 (56 y.o. [name] Primary Care Provider: [name] Other Clinician: Referring Provider: Treating Provider/Extender: [name] in Treatment: 23 History of Present Illness ssociated Signs and Symptoms: Patient has a history of confirmed osteomyelitis of the left foot according to MRI July 2019. 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 HPI Description: 12/08/17 on evaluation today patient presents for initial evaluation and our clinic concerning issues he has been having with his feet bilaterally although the left more than right most recently. He was admitted to hospital where he was placed on IV vancomycin which actually he continue to utilize until discharge and even when discharged continue to be on until around 16 August according to what he tells me. His PICC line was removed at that point. Nonetheless he  has been seen in the wound care center in [facility] before transferring to [place] due to the fact that the doctor there left and they are no longer open. Nonetheless he does have significant osteomyelitis of the left foot noted on MRI which revealed significant issues with necrotic bone including a portion of the distal region of his left great toe which was felt to possibly either be missing as a result of amputation or potentially resorption. Either way he also had osteomyelitis noted of the digit, metatarsal region, cuneiform, and navicular bones. There is definite bone exposure noted externally at this point in time. In regard to the right foot he has issues with ulcerations here as well although he states that recently he'Colin had no issues until this just blistered and reopened. Apparently Xeroform was used in this has caused things to be somewhat more moist and open more according to the patient. He'Colin otherwise been tolerating the dressing changes without complication using silver alginate dressings. He has no discomfort but does seem to be extremely stressed regarding the fact that he did see Dr. 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 The patient stated per notes reviewed that he did not want to have any surgery and therefore has a repeat appointment scheduled with the surgeon on 12/15/17. With all that being said he feels like that he really has not been alerted to what was going on in the severity that was until just recently and has a lot of questions in that regard. I'll be see I explained that seeing him for the first time today I cannot answer a lot of those questions that he has. [name], [name] ([id number]) [id number] Page 2 of 8 12/21/17 on evaluation today patient actually appears to be doing a little better in regard to the right foot in particular. There is one area where there  still definitive bone noted although  the MRI which I did review today that we had ordered was negative for any evidence of osteomyelitis which is good news. Nonetheless there was some necrotic bone noted. Overall the patient appears to be doing fairly well however in regard to the right foot. Unfortunately he does have a new open area on the posterior aspect of his left leg as well as the continued area of necrosis noted in the central portion of the foot. He states that he'Colin actually going to be sent for reevaluation specifically to a limb salvage clinic at [facility] he believes this is something that is primary care provider has been working with. They were just awaiting the results of the MRI. 01/08/18 on evaluation today patient actually appears to be doing maybe just a little better in regard to the overall appearance of his bilateral foot ulcers. In general this does not seem to have worsened at least which is good news. He still has evidence of bone noted on the surface of both wound areas which though dry appearing really has not otherwise changed at this time. He actually has an appointment at [facility] with the wound center to discuss limb salvage. He discusses with me at the last visit. Nonetheless he was somewhat upset today about a couple things one being that he stated that we did not put the order in for the protective dressing for his knees that we discussed last time. With that being said I had put in the order for the protective dressing in fact it was on his order sheet that was signed on 12/21/17. Nonetheless unfortunately it appears this was overlooked by home health and they told him if they had the order they could put the protective dressing on. Nonetheless they told him they did not have the order. Again I believe this was an oversight nothing intentional on anyone'Colin part nonetheless the order was there. Still I had explained to the patient sometimes insurance will not cover for the protective dressing even if it  is ordered and this was discussed in the last visit. 01/25/18 on evaluation today patient presents for follow-up concerning his bilateral lower extremity ulcers. He has actually been doing about the same since I last saw him. We do have them in the room we can look at his gluteal region today as well to ensure that there is nothing open or if so address the issue there. Nonetheless he was supposed to see you and see wound care/limb salvage prior to his visit with me today. Unfortunately when he arrived last time at their clinic he apparently was there on the wrong day the doctor was not even present that he was his to be seen. Nonetheless I had to be switched to a different day and the patient actually is going in the next week to see them. Assuming everything works out this should actually be tomorrow. Nonetheless if they take over care of that point then we will subsequently release care to them. Readmission: 10-15-2021 upon evaluation today patient appears to be doing somewhat poorly in regard to wounds over the left gluteal region. He also has areas in the midline sacrum although this is not quite as bad. There is however some evidence of necrotic bone noted at this point unfortunately. The question is whether this is just something working its way out or if it something that is actually actively infected at this point. We are going to have to evaluate that further  little by little as we go with additional diagnostics. With that being said the patient tells me currently that what we are seeing him for has been open since around 09-05-2021 for the gluteal area on the left and in regard to the sacrum 10-01-2021 he states. With that being said he again is not too significant as far as the overall appearance of the wounds are concerned but again with the bone exposed this is something that we may want to do at pathology and culture in regard to. He has previously been treated for osteomyelitis of the  sacral area. 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 This is a gentleman whom I have previously seen back in 2019. The wound that we were taking care of back at that time is completely cleared. 7/25; 2-week follow-up. The patient has 3 small open areas in the lower sacrum and then an area over the left ischial tuberosity. Both of these are stage III wounds. He has been using Hydrofera Blue. 11-12-2021 upon evaluation today patient appears to be doing well currently in regard to his wounds. The distal sacral area is almost completely closed and looks to be doing great. In regard to the left gluteal region this is showing signs of improvement from a depth perspective size is about the same. Overall I am extremely pleased with where things stand compared to what we were previous. 12-03-2021 upon evaluation today patient appears to be doing well currently in regard to his wound. He is actually showing signs of improvement the sacral region and the right ischial/gluteal region is completely healed. The left ischial/gluteal region is still open but seems to be doing much better. 12-24-2021 upon evaluation today patient appears to be doing well currently in regard to some of his wounds others have reopened or are not doing quite as well. Overall though I think that he is on a good track he just may be needs to be a little bit better on appropriate offloading we discussed that today as well. 01-27-2022 upon evaluation today patient appears to be doing well currently in regard to his wound. He is not showing any signs of significant tissue breakdown but unfortunately the wound is larger than what it was last time I saw him. He tells me that this seems to be a complete shock based on what he is hearing today. He tells me that his son was telling him this has been doing well and that it was smaller and not really bleeding or draining much. Again this is  definitely significantly larger than last time I saw him but does not appear to be obviously infected there is some undermining noted as well. In general I am unsure of what would have caused such a dramatic changes what he seems to feel has happened here but nonetheless indeed I do not feel like that this is doing nearly as well as last time I saw him. 11/6; his wound measured better today per our intake nurse using silver alginate and border foam although according to the patient were having trouble with supplies especially the border foam. He has home health. 02-24-2022 upon evaluation today patient appears to be doing better in regard to his wound. The sacral area is looking like it is trying to break down a little bit is not actually open right now but we definitely need to be careful in this regard. I discussed that with him today. With that being said  the patient tells me that he definitely is not changing positions as frequently as he should and I think that is the primary cause of what we are seeing here. Fortunately there does not appear to be any signs of infection locally or systemically at this time. No fevers, chills, nausea, vomiting, or diarrhea. 03-11-2022 upon evaluation today patient is actually making excellent improvement in general. I am extremely pleased with where things stand I do believe that he is headed on the right direction here. 03-25-2022 upon evaluation today patient appears to be doing well currently in regard to his wounds for the most part although the sacral area appears to be a little bit more broken down than what we would have expected. Fortunately there does not appear to be any signs of infection he tells me that he is not exactly sure what happened that caused this to breakdown to the degree that it is. Fortunately there does not appear to be any signs of active infection locally nor systemically at this time which is great news. No fevers, chills, nausea,  vomiting, or diarrhea. Electronic Signature(Colin) Signed: 03/25/2022 3:15:44 PM By: Worthy Keeler PA-C Entered By: Worthy Keeler on 03/25/2022 15:15:44 [name] ([id number]) [id number] Page 3 of 8 -------------------------------------------------------------------------------- Physical Exam Details Patient Name: Date of Service: [name], [name] 03/25/2022 2:30 PM Medical Record Number: [id number] Patient Account Number: [id number] Date of Birth/Sex: Treating RN: Jun 12, 1965 (56 y.o. [name] Primary Care Provider: [name] Other Clinician: Referring Provider: Treating Provider/Extender: [name] in Treatment: 23 Constitutional Well-nourished and well-hydrated in no acute distress. Respiratory normal breathing without difficulty. Psychiatric this patient is able to make decisions and demonstrates good insight into disease process. Alert and Oriented x 3. pleasant and cooperative. Notes Upon inspection patient'Colin wound bed actually showed signs of good granulation epithelization at this point. Fortunately I do not see any evidence of active infection locally nor systemically which is great news and overall I am extremely pleased with where we stand. In general I do believe that the patient is making pretty good progress here. Electronic Signature(Colin) Signed: 03/25/2022 3:16:04 PM By: Worthy Keeler PA-C Entered By: Worthy Keeler on 03/25/2022 15:16:04 -------------------------------------------------------------------------------- Physician Orders Details Patient Name: Date of Service: [name], Gervais [name]. 03/25/2022 2:30 PM Medical Record Number: [id number] Patient Account Number: [id number] Date of Birth/Sex: Treating RN: 03-14-1966 (56 y.o. [name] Primary Care Provider: [name] Other Clinician: Referring Provider: Treating Provider/Extender: [name] in Treatment:  23 Verbal / Phone Orders: No Diagnosis Coding ICD-10 Coding Code Description L89.153 Pressure ulcer of sacral region, stage 3 L89.893 Pressure ulcer of other site, stage 3 G82.54 Quadriplegia, C5-C7 incomplete Z93.1 Gastrostomy status Follow-up Appointments Return Appointment in 2 weeks. [name], [name] ([id number]) [id number] Page 4 of 8 Nurse Visit as needed [place]: - [place] for wound care. May utilize formulary equivalent dressing for wound treatment orders unless otherwise specified. Home Health Nurse may visit PRN to address patients wound care needs. Scheduled days for dressing changes to be completed; exception, patient has scheduled wound care visit that day. **Please direct any NON-WOUND related issues/requests for orders to patient'Colin Primary Care Physician. **If current dressing causes regression in wound condition, may D/C ordered dressing product/Colin and apply Normal Saline Moist Dressing daily until next [place] or Other MD appointment. **Notify [facility]aling Center of regression in wound condition at [phone].  Bathing/ [employer] wounds with antibacterial soap and water. May shower; gently cleanse wound with antibacterial soap, rinse and pat dry prior to dressing wounds No tub bath. Off-Loading Turn and reposition every 2 hours Other: - relive pressure from heels on bed Wound Treatment Wound #8 - Gluteus Wound Laterality: Left Cleanser: Soap and Water (Generic) 3 x Per Week/30 Days Discharge Instructions: Gently cleanse wound with antibacterial soap, rinse and pat dry prior to dressing wounds Prim Dressing: Hydrofera Blue Ready Transfer Foam, 4x5 (in/in) 3 x Per Week/30 Days ary Discharge Instructions: Apply Hydrofera Blue Ready to wound bed as directed Secondary Dressing: (BORDER) Zetuvit Plus SILICONE BORDER Dressing 5x5 (in/in) (Dispense As Written) 3 x Per  Week/30 Days Discharge Instructions: Please do not put silicone bordered dressings under wraps. Use non-bordered dressing only. Electronic Signature(Colin) Signed: 03/25/2022 4:11:06 PM By: Worthy Keeler PA-C Signed: 03/25/2022 4:39:03 PM By: Rosalio Loud MSN RN CNS WTA Entered By: Rosalio Loud on 03/25/2022 15:55:22 -------------------------------------------------------------------------------- Problem List Details Patient Name: Date of Service: [name], [place] [name]. 03/25/2022 2:30 PM Medical Record Number: [id number] Patient Account Number: [id number] Date of Birth/Sex: Treating RN: 21-Mar-1966 (56 y.o. [name] Primary Care Provider: [name] Other Clinician: Referring Provider: Treating Provider/Extender: [name] in Treatment: 23 Active Problems ICD-10 Encounter Code Description Active Date MDM Diagnosis L89.153 Pressure ulcer of sacral region, stage 3 10/15/2021 No Yes L89.893 Pressure ulcer of other site, stage 3 10/15/2021 No Yes G82.54 Quadriplegia, C5-C7 incomplete 10/15/2021 No Yes [name], [name] ([id number]) [id number] Page 5 of 8 Z93.1 Gastrostomy status 10/15/2021 No Yes Inactive Problems Resolved Problems Electronic Signature(Colin) Signed: 03/25/2022 2:35:03 PM By: Worthy Keeler PA-C Entered By: Worthy Keeler on 03/25/2022 14:35:02 -------------------------------------------------------------------------------- Progress Note Details Patient Name: Date of Service: [name],  [name]. 03/25/2022 2:30 PM Medical Record Number: [id number] Patient Account Number: [id number] Date of Birth/Sex: Treating RN: 09/16/1965 (56 y.o. [name] Primary Care Provider: [name] Other Clinician: Referring Provider: Treating Provider/Extender: [name] in Treatment: 23 Subjective Chief Complaint Information obtained from Patient Sacral and back pressure ulcers History of Present  Illness (HPI) The following HPI elements were documented for the patient'Colin wound: Associated Signs and Symptoms: Patient has a history of confirmed osteomyelitis of the left foot according to MRI July 2019. 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 12/08/17 on evaluation today patient presents for initial evaluation and our clinic concerning issues he has been having with his feet bilaterally although the left more than right most recently. He was admitted to hospital where he was placed on IV vancomycin which actually he continue to utilize until discharge and even when discharged continue to be on until around 16 August according to what he tells me. His PICC line was removed at that point. Nonetheless he has been seen in the wound care center in [facility] before transferring to [place] due to the fact that the doctor there left and they are no longer open. Nonetheless he does have significant osteomyelitis of the left foot noted on MRI which revealed significant issues with necrotic bone including a portion of the distal region of his left great toe which was felt to possibly either be missing as a result of amputation or potentially resorption. Either way he also had osteomyelitis noted of the digit, metatarsal region, cuneiform, and navicular bones. There is definite bone exposure noted externally  at this point in time. In regard to the right foot he has issues with ulcerations here as well although he states that recently he'Colin had no issues until this just blistered and reopened. Apparently Xeroform was used in this has caused things to be somewhat more moist and open more according to the patient. He'Colin otherwise been tolerating the dressing changes without complication using silver alginate dressings. He has no discomfort but does seem to be extremely stressed regarding the fact that he did see Dr. Sharol Given and apparently an  above knee amputation on the left was recommended. The patient stated per notes reviewed that he did not want to have any surgery and therefore has a repeat appointment scheduled with the surgeon on 12/15/17. With all that being said he feels like that he really has not been alerted to what was going on in the severity that was until just recently and has a lot of questions in that regard. I'll be see I explained that seeing him for the first time today I cannot answer a lot of those questions that he has. 12/21/17 on evaluation today patient actually appears to be doing a little better in regard to the right foot in particular. There is one area where there still definitive bone noted although the MRI which I did review today that we had ordered was negative for any evidence of osteomyelitis which is good news. Nonetheless there was some necrotic bone noted. Overall the patient appears to be doing fairly well however in regard to the right foot. Unfortunately he does have a new open area on the posterior aspect of his left leg as well as the continued area of necrosis noted in the central portion of the foot. He states that he'Colin actually going to be sent for reevaluation specifically to a limb salvage clinic at [name] he believes this is something that is primary care provider has been working with. They were just awaiting the results of the MRI. 01/08/18 on evaluation today patient actually appears to be doing maybe just a little better in regard to the overall appearance of his bilateral foot ulcers. In general this does not seem to have worsened at least which is good news. He still has evidence of bone noted on the surface of both wound areas which though dry appearing really has not otherwise changed at this time. He actually has an appointment at [facility] with the wound center to discuss limb salvage. He discusses with me at the last visit. Nonetheless he was somewhat upset today about a couple  things one being that he stated that we did not put the order in for the protective dressing for his knees that we discussed last time. With that being said I had put in the order for the protective dressing in fact it was on his order sheet that was signed on 12/21/17. Nonetheless unfortunately it appears this was overlooked by home health and they told him if they had the order they could put the protective dressing on. Nonetheless they told him they did not have the order. Again I believe this was an oversight nothing intentional on anyone'Colin part nonetheless the order was there. Still I had explained to the patient sometimes insurance will not cover for the protective dressing even if it is ordered and this was discussed in the last visit. 01/25/18 on evaluation today patient presents for follow-up concerning his bilateral lower extremity ulcers. He has actually been doing about the same since I  [name], [name] ([id number]) [id number] Page 6 of 8 last saw him. We do have them in the room we can look at his gluteal region today as well to ensure that there is nothing open or if so address the issue there. Nonetheless he was supposed to see you and see wound care/limb salvage prior to his visit with me today. Unfortunately when he arrived last time at their clinic he apparently was there on the wrong day the doctor was not even present that he was his to be seen. Nonetheless I had to be switched to a different day and the patient actually is going in the next week to see them. Assuming everything works out this should actually be tomorrow. Nonetheless if they take over care of that point then we will subsequently release care to them. Readmission: 10-15-2021 upon evaluation today patient appears to be doing somewhat poorly in regard to wounds over the left gluteal region. He also has areas in the midline sacrum although this is not quite as bad. There is however some  evidence of necrotic bone noted at this point unfortunately. The question is whether this is just something working its way out or if it something that is actually actively infected at this point. We are going to have to evaluate that further little by little as we go with additional diagnostics. With that being said the patient tells me currently that what we are seeing him for has been open since around 09-05-2021 for the gluteal area on the left and in regard to the sacrum 10-01-2021 he states. With that being said he again is not too significant as far as the overall appearance of the wounds are concerned but again with the bone exposed this is something that we may want to do at pathology and culture in regard to. 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 This is a gentleman whom I have previously seen back in 2019. The wound that we were taking care of back at that time is completely cleared. 7/25; 2-week follow-up. The patient has 3 small open areas in the lower sacrum and then an area over the left ischial tuberosity. Both of these are stage III wounds. He has been using Hydrofera Blue. 11-12-2021 upon evaluation today patient appears to be doing well currently in regard to his wounds. The distal sacral area is almost completely closed and looks to be doing great. In regard to the left gluteal region this is showing signs of improvement from a depth perspective size is about the same. Overall I am extremely pleased with where things stand compared to what we were previous. 12-03-2021 upon evaluation today patient appears to be doing well currently in regard to his wound. He is actually showing signs of improvement the sacral region and the right ischial/gluteal region is completely healed. The left ischial/gluteal region is still open but seems to be doing much better. 12-24-2021  upon evaluation today patient appears to be doing well currently in regard to some of his wounds others have reopened or are not doing quite as well. Overall though I think that he is on a good track he just may be needs to be a little bit better on appropriate offloading we discussed that today as well. 01-27-2022 upon evaluation today patient appears to be doing well currently in regard to his wound. He is not showing any signs  of significant tissue breakdown but unfortunately the wound is larger than what it was last time I saw him. He tells me that this seems to be a complete shock based on what he is hearing today. He tells me that his son was telling him this has been doing well and that it was smaller and not really bleeding or draining much. Again this is definitely significantly larger than last time I saw him but does not appear to be obviously infected there is some undermining noted as well. In general I am unsure of what would have caused such a dramatic changes what he seems to feel has happened here but nonetheless indeed I do not feel like that this is doing nearly as well as last time I saw him. 11/6; his wound measured better today per our intake nurse using silver alginate and border foam although according to the patient were having trouble with supplies especially the border foam. He has home health. 02-24-2022 upon evaluation today patient appears to be doing better in regard to his wound. The sacral area is looking like it is trying to break down a little bit is not actually open right now but we definitely need to be careful in this regard. I discussed that with him today. With that being said the patient tells me that he definitely is not changing positions as frequently as he should and I think that is the primary cause of what we are seeing here. Fortunately there does not appear to be any signs of infection locally or systemically at this time. No fevers, chills, nausea,  vomiting, or diarrhea. 03-11-2022 upon evaluation today patient is actually making excellent improvement in general. I am extremely pleased with where things stand I do believe that he is headed on the right direction here. 03-25-2022 upon evaluation today patient appears to be doing well currently in regard to his wounds for the most part although the sacral area appears to be a little bit more broken down than what we would have expected. Fortunately there does not appear to be any signs of infection he tells me that he is not exactly sure what happened that caused this to breakdown to the degree that it is. Fortunately there does not appear to be any signs of active infection locally nor systemically at this time which is great news. No fevers, chills, nausea, vomiting, or diarrhea. Objective Constitutional Well-nourished and well-hydrated in no acute distress. Vitals Time Taken: 3:00 PM, Height: 73 in, Weight: 154 lbs, BMI: 20.3, Temperature: 97.5 F, Pulse: 88 bpm, Respiratory Rate: 18 breaths/min, Blood Pressure: 148/98 mmHg. Respiratory normal breathing without difficulty. Psychiatric this patient is able to make decisions and demonstrates good insight into disease process. Alert and Oriented x 3. pleasant and cooperative. General Notes: Upon inspection patient'Colin wound bed actually showed signs of good granulation epithelization at this point. Fortunately I do not see any evidence of active infection locally nor systemically which is great news and overall I am extremely pleased with where we stand. In general I do believe that the patient is making pretty good progress here. Integumentary (Hair, Skin) Wound #8 status is Open. Original cause of wound was Pressure Injury. The date acquired was: 09/05/2021. The wound has been in treatment 23 weeks. The wound is located on the Left Gluteus. The wound measures 2.1cm length x 1.4cm width x 0.36cm depth; 2.309cm^2 area and 0.831cm^3 volume. There  is no tunneling noted, however, there is undermining starting at 10:00 and ending  at 9:00 with a maximum distance of 0.6cm. There is a medium amount of serosanguineous drainage noted. [name], [name] ([id number]) [id number] Page 7 of 8 Assessment Active Problems ICD-10 Pressure ulcer of sacral region, stage 3 Pressure ulcer of other site, stage 3 Quadriplegia, C5-C7 incomplete Gastrostomy status Plan Follow-up Appointments: Return Appointment in 2 weeks. Nurse Visit as needed Home Health: [place]: - [place] for wound care. May utilize formulary equivalent dressing for wound treatment orders unless otherwise specified. Home Health Nurse may visit PRN to address patientoos wound care needs. Scheduled days for dressing changes to be completed; exception, patient has scheduled wound care visit that day. **Please direct any NON-WOUND related issues/requests for orders to patient'Colin Primary Care Physician. **If current dressing causes regression in wound condition, may D/C ordered dressing product/Colin and apply Normal Saline Moist Dressing daily until next [place] or Other MD appointment. **Notify [facility] of regression in wound condition at [phone]. Bathing/ Shower/ Hygiene: Wash wounds with antibacterial soap and water. May shower; gently cleanse wound with antibacterial soap, rinse and pat dry prior to dressing wounds No tub bath. Off-Loading: Turn and reposition every 2 hours Other: - relive pressure from heels on bed WOUND #8: - Gluteus Wound Laterality: Left Cleanser: Soap and Water (Generic) 3 x Per Week/30 Days Discharge Instructions: Gently cleanse wound with antibacterial soap, rinse and pat dry prior to dressing wounds Secondary Dressing: (BORDER) Zetuvit Plus SILICONE BORDER Dressing 5x5 (in/in) (Dispense As Written) 3 x Per Week/30 Days Discharge Instructions: Please do not put  silicone bordered dressings under wraps. Use non-bordered dressing only. 1. I am going to suggest that based on the fact that the sacral area was little bit more broken down to what we would like to see the patient should continue to focus on appropriate offloading I think that obviously symptoms been causing some breakdown here I am not exactly sure what it is but again I think it something that he can keep a close eye on try to figure out and then be able to move from there. 2. I am also can recommend that the patient should continue to monitor for any signs of infection or worsening in general. Obviously if anything changes he knows to contact the office and let me know. 3. I am then recommend that we have the patient continue with the [facility] which she feels like it was actually doing better for him. Will go ahead and switch back over to that. We will see patient back for reevaluation in 1 week here in the clinic. If anything worsens or changes patient will contact our office for additional recommendations. Electronic Signature(Colin) Signed: 03/25/2022 3:20:02 PM By: Worthy Keeler PA-C Entered By: Worthy Keeler on 03/25/2022 15:20:02 -------------------------------------------------------------------------------- SuperBill Details Patient Name: Date of Service: [name], Lacona [name]. 03/25/2022 Medical Record Number: [id number] Patient Account Number: [id number] Date of Birth/Sex: Treating RN: [birth year] (56 y.o. [name] Primary Care Provider: [name] Other Clinician: Referring Provider: Treating Provider/Extender: [name] in Treatment: [address], [place] [name] ([id number]) [id number] Page 8 of 8 Diagnosis Coding ICD-10 Codes Code Description L89.153 Pressure ulcer of sacral region, stage 3 L89.893 Pressure ulcer of other site, stage 3 G82.54 Quadriplegia, C5-C7 incomplete Z93.1 Gastrostomy status Facility  Procedures : CPT4 Code: [id number] Description: 46270 - WOUND CARE VISIT-LEV 3 EST PT Modifier: Quantity: 1 Physician Procedures : CPT4 Code Description Modifier [id number] - WC PHYS LEVEL 3 -  EST PT ICD-10 Diagnosis Description L89.153 Pressure ulcer of sacral region, stage 3 L89.893 Pressure ulcer of other site, stage 3 G82.54 Quadriplegia, C5-C7 incomplete Z93.1 Gastrostomy  status Quantity: 1 Electronic Signature(Colin) Signed: 03/25/2022 3:55:37 PM By: Rosalio Loud MSN RN CNS WTA Signed: 03/25/2022 4:11:06 PM By: Worthy Keeler PA-C Previous Signature: 03/25/2022 3:20:15 PM Version By: Worthy Keeler PA-C Entered By: Rosalio Loud on 03/25/2022 15:55:36

## 2022-03-25 NOTE — Progress Notes (Addendum)
[name], GRATZ [name] ([id number]) [id number] Page 1 of 9 Visit Report for 03/25/2022 Arrival Information Details Patient Name: Date of Service: [name], [name] 03/25/2022 2:30 PM Medical Record Number: [id number] Patient Account Number: [id number] Date of Birth/Sex: Treating RN: 05/15/1965 (56 y.o. [name] Primary Care Mckenzy Salazar: [name] Other Clinician: Referring [name]: Treating Conway Fedora/Extender: [name] in Treatment: 23 Visit Information History Since Last Visit Added or deleted any medications: No Patient Arrived: Wheel Chair Any new allergies or adverse reactions: No Arrival Time: 14:43 Had a fall or experienced change in No Accompanied By: self activities of daily living that may affect Transfer Assistance: Harrel Lemon Lift risk of falls: Patient Identification Verified: Yes Hospitalized since last visit: No Secondary Verification Process Completed: Yes Pain Present Now: No Patient Requires Transmission-Based Precautions: No Patient Has Alerts: No Electronic Signature(s) Signed: 03/25/2022 4:39:03 PM By: Rosalio Loud MSN RN CNS WTA Entered By: Rosalio Loud on 03/25/2022 15:04:41 -------------------------------------------------------------------------------- Clinic Level of Care Assessment Details Patient Name: Date of Service: [name], Loaza [name]. 03/25/2022 2:30 PM Medical Record Number: [id number] Patient Account Number: [id number] Date of Birth/Sex: Treating RN: [birth year] (56 y.o. [name] Primary Care Lennin Osmond: [name] Other Clinician: Referring Karolina Zamor: Treating [name]/Extender: [name] in Treatment: 23 Clinic Level of Care Assessment Items TOOL 4 Quantity Score X- 1 0 Use when only an EandM is performed on FOLLOW-UP visit ASSESSMENTS - Nursing Assessment / Reassessment X- 1 10 Reassessment of Co-morbidities (includes updates in patient status) X- 1  5 Reassessment of Adherence to Treatment Plan ASSESSMENTS - Wound and Skin A ssessment / Reassessment X - Simple Wound Assessment / Reassessment - one wound 1 5 '[]'$  - 0 Complex Wound Assessment / Reassessment - multiple wounds Colin, Talton Kaseem [name] ([id number]) [id number] Page 2 of 9 '[]'$  - 0 Dermatologic / Skin Assessment (not related to wound area) ASSESSMENTS - Focused Assessment '[]'$  - 0 Circumferential Edema Measurements - multi extremities '[]'$  - 0 Nutritional Assessment / Counseling / Intervention '[]'$  - 0 Lower Extremity Assessment (monofilament, tuning fork, pulses) '[]'$  - 0 Peripheral Arterial Disease Assessment (using hand held doppler) ASSESSMENTS - Ostomy and/or Continence Assessment and Care '[]'$  - 0 Incontinence Assessment and Management '[]'$  - 0 Ostomy Care Assessment and Management (repouching, etc.) PROCESS - Coordination of Care X - Simple Patient / Family Education for ongoing care 1 15 '[]'$  - 0 Complex (extensive) Patient / Family Education for ongoing care X- 1 10 Staff obtains Programmer, systems, Records, T Results / Process Orders est '[]'$  - 0 Staff telephones HHA, Nursing Homes / Clarify orders / etc '[]'$  - 0 Routine Transfer to another Facility (non-emergent condition) '[]'$  - 0 Routine Hospital Admission (non-emergent condition) '[]'$  - 0 New Admissions / Biomedical engineer / Ordering NPWT Apligraf, etc. , '[]'$  - 0 Emergency Hospital Admission (emergent condition) X- 1 10 Simple Discharge Coordination '[]'$  - 0 Complex (extensive) Discharge Coordination PROCESS - Special Needs '[]'$  - 0 Pediatric / Minor Patient Management '[]'$  - 0 Isolation Patient Management '[]'$  - 0 Hearing / Language / Visual special needs '[]'$  - 0 Assessment of Community assistance (transportation, D/C planning, etc.) '[]'$  - 0 Additional assistance / Altered mentation '[]'$  - 0 Support Surface(s) Assessment (bed, cushion, seat, etc.) INTERVENTIONS - Wound Cleansing / Measurement X -  Simple Wound Cleansing - one wound 1 5 '[]'$  - 0 Complex Wound Cleansing - multiple wounds X- 1 5 Wound Imaging (photographs - any number of wounds) '[]'$  - 0 Wound  Tracing (instead of photographs) X- 1 5 Simple Wound Measurement - one wound '[]'$  - 0 Complex Wound Measurement - multiple wounds INTERVENTIONS - Wound Dressings X - Small Wound Dressing one or multiple wounds 1 10 '[]'$  - 0 Medium Wound Dressing one or multiple wounds '[]'$  - 0 Large Wound Dressing one or multiple wounds '[]'$  - 0 Application of Medications - topical '[]'$  - 0 Application of Medications - injection INTERVENTIONS - Miscellaneous '[]'$  - 0 External ear exam '[]'$  - 0 Specimen Collection (cultures, biopsies, blood, body fluids, etc.) '[]'$  - 0 Specimen(s) / Culture(s) sent or taken to Lab for analysis WYLES, Tramaine [name] ([id number]) [id number] Page 3 of 9 '[]'$  - 0 Patient Transfer (multiple staff / Civil Service fast streamer / Similar devices) '[]'$  - 0 Simple Staple / Suture removal (25 or less) '[]'$  - 0 Complex Staple / Suture removal (26 or more) '[]'$  - 0 Hypo / Hyperglycemic Management (close monitor of Blood Glucose) '[]'$  - 0 Ankle / Brachial Index (ABI) - do not check if billed separately X- 1 5 Vital Signs Has the patient been seen at the hospital within the last three years: Yes Total Score: 85 Level Of Care: New/Established - Level 3 Electronic Signature(s) Signed: 03/25/2022 4:39:03 PM By: Rosalio Loud MSN RN CNS WTA Entered By: Rosalio Loud on 03/25/2022 15:55:28 -------------------------------------------------------------------------------- Encounter Discharge Information Details Patient Name: Date of Service: [name], [name]. 03/25/2022 2:30 PM Medical Record Number: [id number] Patient Account Number: [id number] Date of Birth/Sex: Treating RN: 1965-11-23 (56 y.o. [name] Primary Care Sirenity Shew: [name] Other Clinician: Referring [name]: Treating Joannah Gitlin/Extender: [name] in Treatment: 23 Encounter Discharge Information Items Discharge Condition: Stable Ambulatory Status: Wheelchair Discharge Destination: Home Transportation: Private Auto Accompanied By: self Schedule Follow-up Appointment: Yes Clinical Summary of Care: Electronic Signature(s) Signed: 03/25/2022 3:56:03 PM By: Rosalio Loud MSN RN CNS WTA Entered By: Rosalio Loud on 03/25/2022 15:56:03 -------------------------------------------------------------------------------- Lower Extremity Assessment Details Patient Name: Date of Service: [name], Dahlen [name]. 03/25/2022 2:30 PM Medical Record Number: [id number] Patient Account Number: [id number] Date of Birth/Sex: Treating RN: 04/23/1965 (56 y.o. [name] Primary Care Jakiyah Stepney: [name] Other Clinician: Referring Gesenia Bantz: Treating Kally Cadden/Extender: Bobbie Stack [employer], Travontae [name] ([id number]) [id number] Page 4 of 9 Weeks in Treatment: 23 Electronic Signature(s) Signed: 03/25/2022 4:39:03 PM By: Rosalio Loud MSN RN CNS WTA Entered By: Rosalio Loud on 03/25/2022 15:04:56 -------------------------------------------------------------------------------- Multi Wound Chart Details Patient Name: Date of Service: [name], Radium [name]. 03/25/2022 2:30 PM Medical Record Number: [id number] Patient Account Number: [id number] Date of Birth/Sex: Treating RN: 06-11-1965 (56 y.o. [name] Primary Care Dimitri Shakespeare: [name] Other Clinician: Referring Adeline Petitfrere: Treating Jessicah Croll/Extender: [name] in Treatment: 23 Vital Signs Height(in): 73 Pulse(bpm): 88 Weight(lbs): 154 Blood Pressure(mmHg): 148/98 Body Mass Index(BMI): 20.3 Temperature(F): 97.5 Respiratory Rate(breaths/min): 18 [8:Photos:] [N/A:N/A] Left Gluteus N/A N/A Wound Location: Pressure Injury N/A N/A Wounding Event: Pressure Ulcer N/A N/A Primary Etiology: Hypotension,  Myocardial Infarction, N/A N/A Comorbid History: History of pressure wounds, Rheumatoid Arthritis, Paraplegia, Confinement Anxiety 09/05/2021 N/A N/A Date Acquired: 68 N/A N/A Weeks of Treatment: Open N/A N/A Wound Status: No N/A N/A Wound Recurrence: 2.1x1.4x0.36 N/A N/A Measurements L x W x D (cm) 2.309 N/A N/A A (cm) : rea 0.831 N/A N/A Volume (cm) : 58.00% N/A N/A % Reduction in A rea: 84.90% N/A N/A % Reduction in Volume: 10 Starting Position 1 (o'clock): 9 Ending Position 1 (o'clock): 0.6 Maximum Distance  1 (cm): Yes N/A N/A UnderminingWAI, LITT [name] ([id number]) [id number] Page 5 of 9 Category/Stage III N/A N/A Classification: Medium N/A N/A Exudate A mount: Serosanguineous N/A N/A Exudate Type: red, brown N/A N/A Exudate Color: Treatment Notes Wound #8 (Gluteus) Wound Laterality: Left Cleanser Soap and Water Discharge Instruction: Gently cleanse wound with antibacterial soap, rinse and pat dry prior to dressing wounds Peri-Wound Care Topical Primary Dressing Secondary Dressing (BORDER) Zetuvit Plus SILICONE BORDER Dressing 5x5 (in/in) Discharge Instruction: Please do not put silicone bordered dressings under wraps. Use non-bordered dressing only. Secured With Compression Wrap Compression Stockings Environmental education officer) Signed: 03/25/2022 3:55:04 PM By: Rosalio Loud MSN RN CNS WTA Entered By: Rosalio Loud on 03/25/2022 15:55:04 -------------------------------------------------------------------------------- Multi-Disciplinary Care Plan Details Patient Name: Date of Service: [name], Pesotum [name]. 03/25/2022 2:30 PM Medical Record Number: [id number] Patient Account Number: [id number] Date of Birth/Sex: Treating RN: 07-11-1965 (56 y.o. [name] Primary Care Brentin Shin: [name] Other Clinician: Referring Kelliann Pendergraph: Treating Andre Gallego/Extender: [name] in Treatment:  23 Active Inactive Pressure Nursing Diagnoses: Knowledge deficit related to causes and risk factors for pressure ulcer development Knowledge deficit related to management of pressures ulcers Potential for impaired tissue integrity related to pressure, friction, moisture, and shear Goals: Patient will remain free from development of additional pressure ulcers Date Initiated: 12/03/2021 Target Resolution Date: 12/03/2021 Goal Status: Active Patient/caregiver will verbalize risk factors for pressure ulcer development Date Initiated: 12/03/2021 Target Resolution Date: 12/03/2021 Goal Status: Active QAIS, JOWERS ([id number]) [id number] Page 6 of 9 Patient/caregiver will verbalize understanding of pressure ulcer management Date Initiated: 12/03/2021 Target Resolution Date: 12/03/2021 Goal Status: Active Interventions: Assess: immobility, friction, shearing, incontinence upon admission and as needed Assess offloading mechanisms upon admission and as needed Assess potential for pressure ulcer upon admission and as needed Provide education on pressure ulcers Notes: Wound/Skin Impairment Nursing Diagnoses: Impaired tissue integrity Knowledge deficit related to ulceration/compromised skin integrity Goals: Ulcer/skin breakdown will have a volume reduction of 30% by week 4 Date Initiated: 10/15/2021 Target Resolution Date: 11/12/2021 Goal Status: Active Ulcer/skin breakdown will have a volume reduction of 50% by week 8 Date Initiated: 10/15/2021 Target Resolution Date: 12/10/2021 Goal Status: Active Ulcer/skin breakdown will have a volume reduction of 80% by week 12 Date Initiated: 10/15/2021 Target Resolution Date: 01/07/2022 Goal Status: Active Ulcer/skin breakdown will [name] within 14 weeks Date Initiated: 10/15/2021 Target Resolution Date: 01/21/2022 Goal Status: Active Interventions: Assess patient/caregiver ability to obtain necessary supplies Assess  patient/caregiver ability to perform ulcer/skin care regimen upon admission and as needed Assess ulceration(s) every visit Provide education on ulcer and skin care Notes: Electronic Signature(s) Signed: 03/25/2022 4:39:03 PM By: Rosalio Loud MSN RN CNS WTA Entered By: Rosalio Loud on 03/25/2022 15:07:14 -------------------------------------------------------------------------------- Pain Assessment Details Patient Name: Date of Service: [name], Jd [name]. 03/25/2022 2:30 PM Medical Record Number: [id number] Patient Account Number: [id number] Date of Birth/Sex: Treating RN: November 25, 1965 (55 y.o. [name] Primary Care Godfrey Tritschler: [name] Other Clinician: Referring Eirene Rather: Treating Azalya Galyon/Extender: [name] in Treatment: 23 Active Problems Location of Pain Severity and Description of Pain Patient Has Paino No Site Locations [place], Clever [name] ([id number]) [id number] Page 7 of 9 Pain Management and Medication Current Pain Management: Electronic Signature(s) Signed: 03/25/2022 4:39:03 PM By: Rosalio Loud MSN RN CNS WTA Entered By: Rosalio Loud on 03/25/2022 15:04:49 -------------------------------------------------------------------------------- Patient/Caregiver Education Details Patient Name: Date of Service: Philemon Kingdom 12/19/2023andnbsp2:30 PM Medical Record Number: [id number] Patient Account Number:  [id number] Date of Birth/Gender: Treating RN: [birth year] (56 y.o. [name] Primary Care Physician: [name] Other Clinician: Referring Physician: Treating Physician/Extender: [name] in Treatment: 23 Education Assessment Education Provided To: Patient Education Topics Provided Wound/Skin Impairment: Handouts: Caring for Your Ulcer Methods: Explain/Verbal Responses: State content correctly Electronic Signature(s) Signed: 03/25/2022 4:39:03 PM By: Rosalio Loud MSN RN CNS  WTA Entered By: Rosalio Loud on 03/25/2022 15:07:10 [name] ([id number]) [id number] Page 8 of 9 -------------------------------------------------------------------------------- Wound Assessment Details Patient Name: Date of Service: ONEAL, SCHOENBERGER 03/25/2022 2:30 PM Medical Record Number: [id number] Patient Account Number: [id number] Date of Birth/Sex: Treating RN: 09-13-1965 (56 y.o. [name] Primary Care Rafik Koppel: [name] Other Clinician: Referring Draxton Luu: Treating Ginamarie Banfield/Extender: [name] in Treatment: 23 Wound Status Wound Number: 8 Primary Pressure Ulcer Etiology: Wound Location: Left Gluteus Wound Open Wounding Event: Pressure Injury Status: Date Acquired: 09/05/2021 Comorbid Hypotension, Myocardial Infarction, History of pressure wounds, Weeks Of Treatment: 23 History: Rheumatoid Arthritis, Paraplegia, Confinement Anxiety Clustered Wound: No Photos Wound Measurements Length: (cm) 2.1 Width: (cm) 1.4 Depth: (cm) 0.36 Area: (cm) 2.309 Volume: (cm) 0.831 % Reduction in Area: 58% % Reduction in Volume: 84.9% Tunneling: No Undermining: Yes Starting Position (o'clock): 10 Ending Position (o'clock): 9 Maximum Distance: (cm) 0.6 Wound Description Classification: Category/Stage III Exudate Amount: Medium Exudate Type: Serosanguineous Exudate Color: red, brown Treatment Notes Wound #8 (Gluteus) Wound Laterality: Left Cleanser Soap and Water Discharge Instruction: Gently cleanse wound with antibacterial soap, rinse and pat dry prior to dressing wounds Peri-Wound Care Topical Primary Dressing Hydrofera Blue Ready Transfer Foam, 4x5 (in/in) Discharge Instruction: Apply Hydrofera Blue Ready to wound bed as directed Adaiah, Morken Rockne [name] ([id number]) [id number] Page 9 of 9 Secondary Dressing (BORDER) Zetuvit Plus SILICONE BORDER Dressing 5x5 (in/in) Discharge  Instruction: Please do not put silicone bordered dressings under wraps. Use non-bordered dressing only. Secured With Compression Wrap Compression Stockings Environmental education officer) Signed: 03/25/2022 4:39:03 PM By: Rosalio Loud MSN RN CNS WTA Entered By: Rosalio Loud on 03/25/2022 15:04:12 -------------------------------------------------------------------------------- Vitals Details Patient Name: Date of Service: [name], Yoandri [name]. 03/25/2022 2:30 PM Medical Record Number: [id number] Patient Account Number: [id number] Date of Birth/Sex: Treating RN: 1965-07-18 (56 y.o. [name] Primary Care Towana Stenglein: [name] Other Clinician: Referring Zina Pitzer: Treating Jaqlyn Gruenhagen/Extender: [name] in Treatment: 23 Vital Signs Time Taken: 15:00 Temperature (F): 97.5 Height (in): 73 Pulse (bpm): 88 Weight (lbs): 154 Respiratory Rate (breaths/min): 18 Body Mass Index (BMI): 20.3 Blood Pressure (mmHg): 148/98 Reference Range: 80 - 120 mg / dl Electronic Signature(s) Signed: 03/25/2022 4:39:03 PM By: Rosalio Loud MSN RN CNS WTA Entered By: Rosalio Loud on 03/25/2022 15:04:45

## 2022-04-15 ENCOUNTER — Ambulatory Visit: Payer: Medicare Other | Admitting: Physician Assistant

## 2022-04-22 ENCOUNTER — Encounter: Payer: Medicare Other | Attending: Physician Assistant | Admitting: Physician Assistant

## 2022-04-22 DIAGNOSIS — L89893 Pressure ulcer of other site, stage 3: Secondary | ICD-10-CM | POA: Insufficient documentation

## 2022-04-22 DIAGNOSIS — G8254 Quadriplegia, C5-C7 incomplete: Secondary | ICD-10-CM | POA: Insufficient documentation

## 2022-04-22 DIAGNOSIS — L89153 Pressure ulcer of sacral region, stage 3: Secondary | ICD-10-CM | POA: Insufficient documentation

## 2022-04-22 DIAGNOSIS — Z931 Gastrostomy status: Secondary | ICD-10-CM | POA: Insufficient documentation

## 2022-04-22 NOTE — Progress Notes (Signed)
TRAJAN, GROVE [name] ([id number]) [id number] Page 1 of 9 Visit Report for 04/22/2022 Arrival Information Details Patient Name: Date of Service: [name], [name] 04/22/2022 1:15 PM Medical Record Number: [id number] Patient Account Number: [id number] Date of Birth/Sex: Treating RN: Mar 26, 1966 (57 y.o. [name] Primary Care Crosley Stejskal: [name] Other Clinician: Referring Hester Joslin: Treating Tatumn Corbridge/Extender: [name] in Treatment: 27 Visit Information History Since Last Visit Added or deleted any medications: No Patient Arrived: Wheel Chair Has Dressing in Place as Prescribed: Yes Arrival Time: 13:15 Pain Present Now: No Accompanied By: self Transfer Assistance: [place] Patient Identification Verified: Yes Secondary Verification Process Completed: Yes Patient Requires Transmission-Based Precautions: No Patient Has Alerts: No Electronic Signature(s) Signed: 04/22/2022 2:51:16 PM By: Gretta Cool, BSN, RN, CWS, Kim RN, BSN Entered By: Gretta Cool, BSN, RN, CWS, Kim on 04/22/2022 13:15:21 -------------------------------------------------------------------------------- Encounter Discharge Information Details Patient Name: Date of Service: [name], [name]. 04/22/2022 1:15 PM Medical Record Number: [id number] Patient Account Number: [id number] Date of Birth/Sex: Treating RN: 09-21-1965 (57 y.o. [name] Primary Care Geoffrey Hynes: [name] Other Clinician: Referring Rydell Wiegel: Treating Imaya Duffy/Extender: [name] in Treatment: 27 Encounter Discharge Information Items Post Procedure Vitals Discharge Condition: Stable Temperature (F): 98.1 Ambulatory Status: Ambulatory Pulse (bpm): 90 Discharge Destination: Home Respiratory Rate (breaths/min): 16 Transportation: Private Auto Blood Pressure (mmHg): 103/69 Schedule Follow-up Appointment: No Clinical Summary of Care: Electronic Signature(s) Signed: 04/22/2022  2:51:16 PM By: Gretta Cool, BSN, RN, CWS, Kim RN, BSN Entered By: Gretta Cool, BSN, RN, CWS, Kim on 04/22/2022 14:05:49 [name] ([id number]) [id number] Page 2 of 9 -------------------------------------------------------------------------------- Lower Extremity Assessment Details Patient Name: Date of Service: BARETT, WHIDBEE 04/22/2022 1:15 PM Medical Record Number: [id number] Patient Account Number: [id number] Date of Birth/Sex: Treating RN: [birth year] (57 y.o. [name] Primary Care Chidinma Clites: [name] Other Clinician: Referring Odus Clasby: Treating Shakena Callari/Extender: [name] in Treatment: 27 Electronic Signature(s) Signed: 04/22/2022 2:51:16 PM By: Gretta Cool, BSN, RN, CWS, Kim RN, BSN Entered By: Gretta Cool, BSN, RN, CWS, Kim on 04/22/2022 13:25:52 -------------------------------------------------------------------------------- Multi Wound Chart Details Patient Name: Date of Service: [name], Colin. [name]. 04/22/2022 1:15 PM Medical Record Number: [id number] Patient Account Number: [id number] Date of Birth/Sex: Treating RN: [birth year] (56 y.o. [name], [name] Primary Care Ediel Unangst: [name] Other Clinician: Referring Stirling Orton: Treating Araceli Arango/Extender: [name] in Treatment: 27 Vital Signs Height(in): 73 Pulse(bpm): 90 Weight(lbs): 154 Blood Pressure(mmHg): 103/69 Body Mass Index(BMI): 20.3 Temperature(F): 98.1 Respiratory Rate(breaths/min): 18 [10:Photos: No Photos Midline Sacrum Wound Location: Pressure Injury Wounding Event: Pressure Ulcer Primary Etiology: Hypotension, Myocardial Infarction, Comorbid History: History of pressure wounds, Rheumatoid Arthritis, Paraplegia, Confinement Anxiety 04/18/2022  Date Acquired: 0 Weeks of Treatment: Open Wound Status: No Wound Recurrence: 2x3.5x0.2 Measurements L x W x D (cm) 5.498 A (cm) : rea 1.1 Volume (cm) : N/A % Reduction in A rea: N/A % Reduction in  Volume: Position 1 (o'clock):] [8:No Photos Left  Gluteus Pressure Injury Pressure Ulcer Hypotension, Myocardial Infarction, History of pressure wounds, Rheumatoid Arthritis, Paraplegia, Confinement Anxiety 09/05/2021 27 Open No 0.7x0.5x0.2 0.275 0.055 95.00% 99.00% 11] [N/A:N/A N/A N/A N/A N/A N/A N/A  N/A N/A N/A N/A N/A N/A N/A] ARCADIO, COPE ([id number]) [10:Maximum Distance 1 (cm): No Tunneling: Category/Stage III Classification: Large Exudate A mount: Serous Exudate Type: amber Exudate Color: Flat and Intact Wound Margin: Medium (34-66%) Granulation A mount: Red, Hyper-granulation Granulation Quality:  Medium (34-66%) Necrotic A mount: Fat Layer (Subcutaneous  Tissue): Yes Fat Layer (Subcutaneous Tissue): Yes N/A Exposed Structures: Fascia: No Tendon: No Muscle: No Joint: No Bone: No None Epithelialization: N/A Assessment Notes:] [8:1 Yes Category/Stage  III Medium Serosanguineous red, brown N/A Large (67-100%) Red Small (1-33%) None unable to visualize base of wound due N/A to depth.] [N/[id number] Page 3 of 9 N/A N/A N/A N/A N/A N/A N/A N/A N/A N/A] Treatment Notes Electronic Signature(s) Signed: 04/22/2022 2:51:16 PM By: Gretta Cool, BSN, RN, CWS, Kim RN, BSN Previous Signature: 04/22/2022 1:31:56 PM Version By: Gretta Cool, BSN, RN, CWS, Kim RN, BSN Entered By: Gretta Cool, BSN, RN, CWS, Kim on 04/22/2022 13:41:25 -------------------------------------------------------------------------------- Multi-Disciplinary Care Plan Details Patient Name: Date of Service: [name], [name] 04/22/2022 1:15 PM Medical Record Number: [id number] Patient Account Number: [id number] Date of Birth/Sex: Treating RN: Nov 21, 1965 (57 y.o. [name], [name] Primary Care Tayshaun Kroh: [name] Other Clinician: Referring Khayman Kirsch: Treating Onesti Bonfiglio/Extender: [name] in Treatment: 27 Active Inactive Pressure Nursing Diagnoses: Knowledge deficit related to causes and risk  factors for pressure ulcer development Knowledge deficit related to management of pressures ulcers Potential for impaired tissue integrity related to pressure, friction, moisture, and shear Goals: Patient will remain free from development of additional pressure ulcers Date Initiated: 12/03/2021 Target Resolution Date: 12/03/2021 Goal Status: Active Patient/caregiver will verbalize risk factors for pressure ulcer development Date Initiated: 12/03/2021 Target Resolution Date: 12/03/2021 Goal Status: Active Patient/caregiver will verbalize understanding of pressure ulcer management Date Initiated: 12/03/2021 Target Resolution Date: 12/03/2021 Goal Status: Active Interventions: Assess: immobility, friction, shearing, incontinence upon admission and as needed Assess offloading mechanisms upon admission and as needed Assess potential for pressure ulcer upon admission and as needed Provide education on pressure ulcers Hersch, [name] ([id number]) [id number] Page 4 of 9 Notes: Wound/Skin Impairment Nursing Diagnoses: Impaired tissue integrity Knowledge deficit related to ulceration/compromised skin integrity Goals: Ulcer/skin breakdown will have a volume reduction of 30% by week 4 Date Initiated: 10/15/2021 Target Resolution Date: 11/12/2021 Goal Status: Active Ulcer/skin breakdown will have a volume reduction of 50% by week 8 Date Initiated: 10/15/2021 Target Resolution Date: 12/10/2021 Goal Status: Active Ulcer/skin breakdown will have a volume reduction of 80% by week 12 Date Initiated: 10/15/2021 Target Resolution Date: 01/07/2022 Goal Status: Active Ulcer/skin breakdown will heal within 14 weeks Date Initiated: 10/15/2021 Target Resolution Date: 01/21/2022 Goal Status: Active Interventions: Assess patient/caregiver ability to obtain necessary supplies Assess patient/caregiver ability to perform ulcer/skin care regimen upon admission and as needed Assess  ulceration(s) every visit Provide education on ulcer and skin care Notes: Electronic Signature(s) Signed: 04/22/2022 2:51:16 PM By: Gretta Cool, BSN, RN, CWS, Kim RN, BSN Entered By: Gretta Cool, BSN, RN, CWS, Kim on 04/22/2022 14:04:56 -------------------------------------------------------------------------------- Pain Assessment Details Patient Name: Date of Service: [name], [name]. 04/22/2022 1:15 PM Medical Record Number: [id number] Patient Account Number: [id number] Date of Birth/Sex: Treating RN: [birth year] (57 y.o. [name] Primary Care Trayce Caravello: [name] Other Clinician: Referring Deaken Jurgens: Treating Pauleen Goleman/Extender: [name] in Treatment: 27 Active Problems Location of Pain Severity and Description of Pain Patient Has Paino No Site Locations [place], [place] [name] ([id number]) [id number].pdf Page 5 of 9 Pain Management and Medication Current Pain Management: Electronic Signature(s) Signed: 04/22/2022 2:51:16 PM By: Gretta Cool, BSN, RN, CWS, Kim RN, BSN Entered By: Gretta Cool, BSN, RN, CWS, Kim on 04/22/2022 13:15:50 -------------------------------------------------------------------------------- Patient/Caregiver Education Details Patient Name: Date of Service: Philemon Kingdom 1/16/2024andnbsp1:15 PM Medical Record Number: [id number] Patient Account Number: [id number] Date of Birth/Gender: Treating RN: 1965/09/30 [phone]  y.o. Jerilynn Mages) Cornell Barman Primary Care Physician: [name] Other Clinician: Referring Physician: Treating Physician/Extender: [name] in Treatment: 27 Education Assessment Education Provided To: Patient Education Topics Provided Pressure: Handouts: Pressure Injury: Prevention and Offloading Methods: Demonstration, Explain/Verbal Responses: State content correctly Wound/Skin Impairment: Handouts: Caring for Your Ulcer Methods: Demonstration, Explain/Verbal Responses: State content  correctly Electronic Signature(s) Signed: 04/22/2022 2:51:16 PM By: Gretta Cool, BSN, RN, CWS, Kim RN, BSN Entered By: Gretta Cool, BSN, RN, CWS, Kim on 04/22/2022 14:04:52 [name] ([id number]) [id number] Page 6 of 9 -------------------------------------------------------------------------------- Wound Assessment Details Patient Name: Date of Service: [name], [name] 04/22/2022 1:15 PM Medical Record Number: [id number] Patient Account Number: [id number] Date of Birth/Sex: Treating RN: November 24, 1965 (57 y.o. [name], [name] Primary Care Fredric Slabach: [name] Other Clinician: Referring Ray Glacken: Treating Traeton Bordas/Extender: [name] in Treatment: 27 Wound Status Wound Number: 10 Primary Pressure Ulcer Etiology: Wound Location: Midline Sacrum Wound Open Wounding Event: Pressure Injury Status: Date Acquired: 04/18/2022 Comorbid Hypotension, Myocardial Infarction, History of pressure wounds, Weeks Of Treatment: 0 History: Rheumatoid Arthritis, Paraplegia, Confinement Anxiety Clustered Wound: No Photos Photo Uploaded By: Gretta Cool, BSN, RN, CWS, Kim on 04/22/2022 14:08:54 Wound Measurements Length: (cm) 2 Width: (cm) 3.5 Depth: (cm) 0.2 Area: (cm) 5.498 Volume: (cm) 1.1 % Reduction in Area: % Reduction in Volume: Epithelialization: None Tunneling: No Undermining: No Wound Description Classification: Category/Stage III Wound Margin: Flat and Intact Exudate Amount: Large Exudate Type: Serous Exudate Color: amber Foul Odor After Cleansing: No Slough/Fibrino Yes Wound Bed Granulation Amount: Medium (34-66%) Exposed Structure Granulation Quality: Red, Hyper-granulation Fascia Exposed: No Necrotic Amount: Medium (34-66%) Fat Layer (Subcutaneous Tissue) Exposed: Yes Necrotic Quality: Adherent Slough Tendon Exposed: No Muscle Exposed: No Joint Exposed: No Bone Exposed: No Treatment Notes Wound #10 (Sacrum) Wound Laterality:  Midline Cleanser Soap and Water Discharge Instruction: Gently cleanse wound with antibacterial soap, rinse and pat dry prior to dressing wounds Cardarelli, Marvis [name] ([id number]) [id number] Page 7 of 9 Peri-Wound Care Skin Prep Discharge Instruction: Use skin prep as directed Topical Primary Dressing Prisma 4.34 (in) Discharge Instruction: Moisten w/normal saline or sterile water; Cover wound as directed. Do not remove from wound bed. Secondary Dressing (BORDER) Zetuvit Plus SILICONE BORDER Dressing 5x5 (in/in) Discharge Instruction: Please do not put silicone bordered dressings under wraps. Use non-bordered dressing only. Secured With Compression Wrap Compression Stockings Environmental education officer) Signed: 04/22/2022 2:51:16 PM By: Gretta Cool, BSN, RN, CWS, Kim RN, BSN Entered By: Gretta Cool, BSN, RN, CWS, Kim on 04/22/2022 13:23:52 -------------------------------------------------------------------------------- Wound Assessment Details Patient Name: Date of Service: [name], [name]. 04/22/2022 1:15 PM Medical Record Number: [id number] Patient Account Number: [id number] Date of Birth/Sex: Treating RN: [birth year] (57 y.o. [name], [name] Primary Care Beverely Suen: [name] Other Clinician: Referring Desera Graffeo: Treating Chianna Spirito/Extender: [name] in Treatment: 27 Wound Status Wound Number: 8 Primary Pressure Ulcer Etiology: Wound Location: Left Gluteus Wound Open Wounding Event: Pressure Injury Status: Date Acquired: 09/05/2021 Comorbid Hypotension, Myocardial Infarction, History of pressure wounds, Weeks Of Treatment: 27 History: Rheumatoid Arthritis, Paraplegia, Confinement Anxiety Clustered Wound: No Photos Photo Uploaded By: Gretta Cool, BSN, RN, CWS, Kim on 04/22/2022 14:09:04 Wound Measurements Length: (cm) 0.7 Width: (cm) 0.5 [name], [name] ([id number]) Depth: (cm) 0.2 Area: (cm) 0.275 Volume: (cm) 0.055 %  Reduction in Area: 95% % Reduction in Volume: 99% [id number] Page 8 of 9 Epithelialization: None Tunneling: Yes Position (o'clock): 11 Maximum Distance: (cm) 1 Undermining: No Wound Description Classification: Category/Stage III Exudate Amount:  Medium Exudate Type: Serosanguineous Exudate Color: red, brown Foul Odor After Cleansing: No Slough/Fibrino No Wound Bed Granulation Amount: Large (67-100%) Exposed Structure Granulation Quality: Red Fat Layer (Subcutaneous Tissue) Exposed: Yes Necrotic Amount: Small (1-33%) Necrotic Quality: Adherent Slough Assessment Notes unable to visualize base of wound due to depth. Treatment Notes Wound #8 (Gluteus) Wound Laterality: Left Cleanser Soap and Water Discharge Instruction: Gently cleanse wound with antibacterial soap, rinse and pat dry prior to dressing wounds Peri-Wound Care Skin Prep Discharge Instruction: Use skin prep as directed Topical Primary Dressing Prisma 4.34 (in) Discharge Instruction: Moisten w/normal saline or sterile water; Cover wound as directed. Do not remove from wound bed. Secondary Dressing (BORDER) Zetuvit Plus SILICONE BORDER Dressing 5x5 (in/in) Discharge Instruction: Please do not put silicone bordered dressings under wraps. Use non-bordered dressing only. Secured With Compression Wrap Compression Stockings Environmental education officer) Signed: 04/22/2022 2:51:16 PM By: Gretta Cool, BSN, RN, CWS, Kim RN, BSN Entered By: Gretta Cool, BSN, RN, CWS, Kim on 04/22/2022 13:25:42 -------------------------------------------------------------------------------- Vitals Details Patient Name: Date of Service: [name], [place] [name]. 04/22/2022 1:15 PM Medical Record Number: [id number] Patient Account Number: [id number] Date of Birth/Sex: Treating RN: 06-29-1965 (57 y.o. [name] Primary Care Mikey Maffett: [name] Other Clinician: [name] ([id number])  [id number] Page 9 of 9 Referring Ozella Comins: Treating Palmer Fahrner/Extender: [name] in Treatment: 27 Vital Signs Time Taken: 13:15 Temperature (F): 98.1 Height (in): 73 Pulse (bpm): 90 Weight (lbs): 154 Respiratory Rate (breaths/min): 18 Body Mass Index (BMI): 20.3 Blood Pressure (mmHg): 103/69 Reference Range: 80 - 120 mg / dl Electronic Signature(s) Signed: 04/22/2022 2:51:16 PM By: Gretta Cool, BSN, RN, CWS, Kim RN, BSN Entered By: Gretta Cool, BSN, RN, CWS, Kim on 04/22/2022 13:15:44

## 2022-04-22 NOTE — Progress Notes (Addendum)
[name], [name] ([id number]) [id number] Page 1 of 9 Visit Report for 04/22/2022 Chief Complaint Document Details Patient Name: Date of Service: [name], [name] 04/22/2022 1:15 PM Medical Record Number: [id number] Patient Account Number: [id number] Date of Birth/Sex: Treating RN: [birth year] (57 y.o. [name]: [name] Other Clinician: Referring [name]: Treating [name]/Extender: [name] in Treatment: 27 Information Obtained from: Patient Chief Complaint Sacral and back pressure ulcers Electronic Signature(s) Signed: 04/22/2022 1:25:12 PM By: Worthy Keeler PA-C Entered By: Worthy Keeler on 04/22/2022 13:25:12 -------------------------------------------------------------------------------- Debridement Details Patient Name: Date of Service: [name],  [name]. 04/22/2022 1:15 PM Medical Record Number: [id number] Patient Account Number: [id number] Date of Birth/Sex: Treating RN: 30-Mar-1966 (57 y.o. [name]: [name] Other Clinician: Referring [name]: Treating [name]/Extender: [name] in Treatment: 27 Debridement Performed for Assessment: Wound #10 Midline Sacrum Performed By: Physician Tommie Sams., PA-C Debridement Type: Debridement Level of Consciousness (Pre-procedure): Awake and Alert Pre-procedure Verification/Time Out Yes - 13:42 Taken: T Area Debrided (L x W): otal 2 (cm) x 3.5 (cm) = 7 (cm) Tissue and other material debrided: Viable, Non-Viable, Slough, Subcutaneous, Slough Level: Skin/Subcutaneous Tissue Debridement Description: Excisional Instrument: Curette Bleeding: Moderate Hemostasis Achieved: Pressure Response to Treatment: Procedure was tolerated well Level of Consciousness (Post- Awake and Alert procedure): Post Debridement Measurements of Total Wound [name] ([id number])  [id number] Page 2 of 9 Length: (cm) 2 Stage: Category/Stage III Width: (cm) 3.5 Depth: (cm) 0.3 Volume: (cm) 1.649 Character of Wound/Ulcer Post Debridement: Stable Post Procedure Diagnosis Same as Pre-procedure Electronic Signature(s) Signed: 04/22/2022 2:51:16 PM By: Gretta Cool, BSN, RN, CWS, Kim RN, BSN Signed: 04/22/2022 5:09:00 PM By: Worthy Keeler PA-C Entered By: Gretta Cool, BSN, RN, CWS, Kim on 04/22/2022 13:43:34 -------------------------------------------------------------------------------- HPI Details Patient Name: Date of Service: [name], [name]. 04/22/2022 1:15 PM Medical Record Number: [id number] Patient Account Number: [id number] Date of Birth/Sex: Treating RN: [birth year] (57 y.o. [name]: [name] Other Clinician: Referring [name]: Treating [name]/Extender: [name] in Treatment: 27 History of Present Illness ssociated Signs and Symptoms: Patient has a history of confirmed osteomyelitis of the left foot according to MRI July 2019. 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 HPI Description: 12/08/17 on evaluation today patient presents for initial evaluation and our clinic concerning issues he has been having with his feet bilaterally although the left more than right most recently. He was admitted to hospital where he was placed on IV vancomycin which actually he continue to utilize until discharge and even when discharged continue to be on until around 16 August according to what he tells me. His PICC line was removed at that point. Nonetheless he has been seen in the wound care center in [facility] before transferring to [place] due to the fact that the doctor there left and they are no longer open. Nonetheless he does have significant osteomyelitis of the left foot noted on MRI which revealed significant issues with  necrotic bone including a portion of the distal region of his left great toe which was felt to possibly either be missing as a result of amputation or potentially resorption. Either way he also had osteomyelitis noted of the digit, metatarsal region, cuneiform, and navicular bones. There is definite bone exposure noted externally at this point in time. In regard to  the right foot he has issues with ulcerations here as well although he states that recently he's had no issues until this just blistered and reopened. Apparently Xeroform was used in this has caused things to be somewhat more moist and open more according to the patient. He's otherwise been tolerating the dressing changes without complication using silver alginate dressings. He has no discomfort but does seem to be extremely stressed regarding the fact that he did see Dr. 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 The patient stated per notes reviewed that he did not want to have any surgery and therefore has a repeat appointment scheduled with the surgeon on 12/15/17. With all that being said he feels like that he really has not been alerted to what was going on in the severity that was until just recently and has a lot of questions in that regard. I'll be see I explained that seeing him for the first time today I cannot answer a lot of those questions that he has. 12/21/17 on evaluation today patient actually appears to be doing a little better in regard to the right foot in particular. There is one area where there still definitive bone noted although the MRI which I did review today that we had ordered was negative for any evidence of osteomyelitis which is good news. Nonetheless there was some necrotic bone noted. Overall the patient appears to be doing fairly well however in regard to the right foot. Unfortunately he does have a new open area on the posterior aspect of his left leg as well as the continued  area of necrosis noted in the central portion of the foot. He states that he's actually going to be sent for reevaluation specifically to a limb salvage clinic at [facility] he believes this is something that is primary care [name] has been working with. They were just awaiting the results of the MRI. 01/08/18 on evaluation today patient actually appears to be doing maybe just a little better in regard to the overall appearance of his bilateral foot ulcers. In general this does not seem to have worsened at least which is good news. He still has evidence of bone noted on the surface of both wound areas which though dry appearing really has not otherwise changed at this time. He actually has an appointment at [facility] with the wound center to discuss limb salvage. He discusses with me at the last visit. Nonetheless he was somewhat upset today about a couple things one being that he stated that we did not put the order in for the protective dressing for his knees that we discussed last time. With that being said I had put in the order for the protective dressing in fact it was on his order sheet that was signed on 12/21/17. Nonetheless unfortunately it appears this was overlooked by home health and they told him if they had the order they could put the protective dressing on. Nonetheless they told him they did not have the order. Again I believe this was an oversight nothing intentional on anyone's part nonetheless the order was there. Still I had explained to the patient sometimes insurance will not cover for the protective dressing even if it is ordered and this was discussed in the last visit. 01/25/18 on evaluation today patient presents for follow-up concerning his bilateral lower extremity ulcers. He has actually been doing about the same since I last saw him. We do have them in the  room we can look at his gluteal region today as well to ensure that there is nothing open or if so address the issue  there. Nonetheless he was supposed to see you and see wound care/limb salvage prior to his visit with me today. Unfortunately when he arrived last time at their clinic he apparently was there on the wrong day the doctor was not even present that he was his to be seen. Nonetheless I had to be switched to a different day and the patient actually is going in the next week to see them. Assuming everything works out this should actually be tomorrow. Nonetheless if they take over care of that point then we will subsequently release care to them. Readmission: [name], [name] ([id number]) [id number] Page 3 of 9 10-15-2021 upon evaluation today patient appears to be doing somewhat poorly in regard to wounds over the left gluteal region. He also has areas in the midline sacrum although this is not quite as bad. There is however some evidence of necrotic bone noted at this point unfortunately. The question is whether this is just something working its way out or if it something that is actually actively infected at this point. We are going to have to evaluate that further little by little as we go with additional diagnostics. With that being said the patient tells me currently that what we are seeing him for has been open since around 09-05-2021 for the gluteal area on the left and in regard to the sacrum 10-01-2021 he states. With that being said he again is not too significant as far as the overall appearance of the wounds are concerned but again with the bone exposed this is something that we may want to do at pathology and culture in regard to. 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 This is a gentleman whom I have previously seen back in 2019. The wound that we were taking care of back at that time is completely cleared. 7/25; 2-week follow-up. The patient  has 3 small open areas in the lower sacrum and then an area over the left ischial tuberosity. Both of these are stage III wounds. He has been using Hydrofera Blue. 11-12-2021 upon evaluation today patient appears to be doing well currently in regard to his wounds. The distal sacral area is almost completely closed and looks to be doing great. In regard to the left gluteal region this is showing signs of improvement from a depth perspective size is about the same. Overall I am extremely pleased with where things stand compared to what we were previous. 12-03-2021 upon evaluation today patient appears to be doing well currently in regard to his wound. He is actually showing signs of improvement the sacral region and the right ischial/gluteal region is completely healed. The left ischial/gluteal region is still open but seems to be doing much better. 12-24-2021 upon evaluation today patient appears to be doing well currently in regard to some of his wounds others have reopened or are not doing quite as well. Overall though I think that he is on a good track he just may be needs to be a little bit better on appropriate offloading we discussed that today as well. 01-27-2022 upon evaluation today patient appears to be doing well currently in regard to his wound. He is not showing any signs of significant tissue breakdown but unfortunately the wound is  larger than what it was last time I saw him. He tells me that this seems to be a complete shock based on what he is hearing today. He tells me that his son was telling him this has been doing well and that it was smaller and not really bleeding or draining much. Again this is definitely significantly larger than last time I saw him but does not appear to be obviously infected there is some undermining noted as well. In general I am unsure of what would have caused such a dramatic changes what he seems to feel has happened here but nonetheless indeed I do not feel  like that this is doing nearly as well as last time I saw him. 11/6; his wound measured better today per our intake nurse using silver alginate and border foam although according to the patient were having trouble with supplies especially the border foam. He has home health. 02-24-2022 upon evaluation today patient appears to be doing better in regard to his wound. The sacral area is looking like it is trying to break down a little bit is not actually open right now but we definitely need to be careful in this regard. I discussed that with him today. With that being said the patient tells me that he definitely is not changing positions as frequently as he should and I think that is the primary cause of what we are seeing here. Fortunately there does not appear to be any signs of infection locally or systemically at this time. No fevers, chills, nausea, vomiting, or diarrhea. 03-11-2022 upon evaluation today patient is actually making excellent improvement in general. I am extremely pleased with where things stand I do believe that he is headed on the right direction here. 03-25-2022 upon evaluation today patient appears to be doing well currently in regard to his wounds for the most part although the sacral area appears to be a little bit more broken down than what we would have expected. Fortunately there does not appear to be any signs of infection he tells me that he is not exactly sure what happened that caused this to breakdown to the degree that it is. Fortunately there does not appear to be any signs of active infection locally nor systemically at this time which is great news. No fevers, chills, nausea, vomiting, or diarrhea. 04-22-2022 upon evaluation today patient's wound actually appears to be doing well on the left and the gluteal fold location. With that being said in regard to the sacral area this is open and a little bit more slough covered at this point. Fortunately I do not see any  signs of infection locally nor systemically which is great news and overall I am extremely pleased with where things stand today. Overall I am very happy with where we are and I do think that he can get the sacral area he will get again but we need to keep a close eye on everything for sure. He does tell me he is good to be more aggressive with appropriate offloading which I think is definitely a good way to go. Electronic Signature(s) Signed: 04/22/2022 2:32:09 PM By: Worthy Keeler PA-C Entered By: Worthy Keeler on 04/22/2022 14:32:08 -------------------------------------------------------------------------------- Physical Exam Details Patient Name: Date of Service: [name], [name] 04/22/2022 1:15 PM Medical Record Number: [id number] Patient Account Number: [id number] Date of Birth/Sex: Treating RN: [birth year] (57 y.o. [name]: [name] Other Clinician: Referring [name]: Treating [name]/Extender: Joaquim Lai,  Jacolyn Reedy, Jeneen Rinks Weeks in Treatment: 27 Constitutional Well-nourished and well-hydrated in no acute distress. Respiratory normal breathing without difficulty. MOUHAMED, GLASSCO [name] ([id number]) [id number] Page 4 of 9 Psychiatric this patient is able to make decisions and demonstrates good insight into disease process. Alert and Oriented x 3. pleasant and cooperative. Notes Upon inspection patient's wounds again did require sharp debridement in regard to the sacral area and he tolerated that without complication and postdebridement this is significantly improved. The wound in the left gluteal fold is healing quite nicely no debridement necessary. Electronic Signature(s) Signed: 04/22/2022 2:32:28 PM By: Worthy Keeler PA-C Entered By: Worthy Keeler on 04/22/2022 14:32:28 -------------------------------------------------------------------------------- Physician Orders Details Patient Name: Date of Service: [address], [place] [name]. 04/22/2022 1:15 PM Medical Record Number: [id number] Patient Account Number: [id number] Date of Birth/Sex: Treating RN: 1965/12/24 (57 y.o. [name]: [name] Other Clinician: Referring [name]: Treating [name]/Extender: [name] in Treatment: 27 Verbal / Phone Orders: No Diagnosis Coding ICD-10 Coding Code Description L89.153 Pressure ulcer of sacral region, stage 3 L89.893 Pressure ulcer of other site, stage 3 G82.54 Quadriplegia, C5-C7 incomplete Z93.1 Gastrostomy status Follow-up Appointments Return Appointment in 2 weeks. Nurse Visit as needed [place]: - [name] for wound care. May utilize formulary equivalent dressing for wound treatment orders unless otherwise specified. Home Health Nurse may visit PRN to address patients wound care needs. Scheduled days for dressing changes to be completed; exception, patient has scheduled wound care visit that day. **Please direct any NON-WOUND related issues/requests for orders to patient's Primary Care Physician. **If current dressing causes regression in wound condition, may D/C ordered dressing product/s and apply Normal Saline Moist Dressing daily until next [place] or Other MD appointment. **Notify [facility] of regression in wound condition at [phone]. Bathing/ [employer] wounds with antibacterial soap and water. May shower; gently cleanse wound with antibacterial soap, rinse and pat dry prior to dressing wounds No tub bath. Off-Loading Turn and reposition every 2 hours Other: - relive pressure from heels on bed Wound Treatment Wound #10 - Sacrum Wound Laterality: Midline Cleanser: Soap and Water (Generic) 3 x Per Week/30 Days Discharge Instructions: Gently cleanse wound with antibacterial soap, rinse and pat dry prior to dressing wounds Peri-Wound Care: Skin Prep (Register) 3 x Per Week/30 Days Discharge Instructions: Use skin prep as directed Prim Dressing: Prisma 4.34 (in) ([place]) (Dispense As Written) 3 x Per Week/30 Days BEHR, CISLO [name] ([id number]) [id number] Page 5 of 9 Discharge Instructions: Moisten w/normal saline or sterile water; Cover wound as directed. Do not remove from wound bed. Secondary Dressing: (BORDER) Zetuvit Plus SILICONE BORDER Dressing 5x5 (in/in) (Home Health) (Dispense As Written) 3 x Per Week/30 Days Discharge Instructions: Please do not put silicone bordered dressings under wraps. Use non-bordered dressing only. Wound #8 - Gluteus Wound Laterality: Left Cleanser: Soap and Water (Generic) 3 x Per Week/30 Days Discharge Instructions: Gently cleanse wound with antibacterial soap, rinse and pat dry prior to dressing wounds Peri-Wound Care: Skin Prep ([place]) 3 x Per Week/30 Days Discharge Instructions: Use skin prep as directed Prim Dressing: Prisma 4.34 (in) (Home Health) (Dispense As Written) 3 x Per Week/30 Days ary Discharge Instructions: Moisten w/normal saline or sterile water; Cover wound as directed. Do not remove from wound bed. Secondary Dressing: (BORDER) Zetuvit Plus SILICONE BORDER Dressing 5x5 (in/in) (Home Health) (Dispense As Written) 3 x Per  Week/30 Days Discharge Instructions: Please do not put silicone bordered dressings under wraps. Use non-bordered dressing only. Electronic Signature(s) Signed: 04/22/2022 2:51:16 PM By: Gretta Cool, BSN, RN, CWS, Kim RN, BSN Signed: 04/22/2022 5:09:00 PM By: Worthy Keeler PA-C Entered By: Gretta Cool BSN, RN, CWS, Kim on 04/22/2022 14:04:09 -------------------------------------------------------------------------------- Problem List Details Patient Name: Date of Service: [name], [place] [name]. 04/22/2022 1:15 PM Medical Record Number: [id number] Patient Account Number: [id number] Date of Birth/Sex: Treating RN: Apr 17, 1965 (57 y.o. [name]: [name] Other Clinician: Referring [name]: Treating [name]/Extender: [name] in Treatment: 27 Active Problems ICD-10 Encounter Code Description Active Date MDM Diagnosis L89.153 Pressure ulcer of sacral region, stage 3 10/15/2021 No Yes L89.893 Pressure ulcer of other site, stage 3 10/15/2021 No Yes G82.54 Quadriplegia, C5-C7 incomplete 10/15/2021 No Yes Z93.1 Gastrostomy status 10/15/2021 No Yes Inactive Problems Resolved Problems Kenn, Rekowski Jermarion [name] ([id number]) [id number] Page 6 of 9 Electronic Signature(s) Signed: 04/22/2022 1:25:08 PM By: Worthy Keeler PA-C Entered By: Worthy Keeler on 04/22/2022 13:25:07 -------------------------------------------------------------------------------- Progress Note Details Patient Name: Date of Service: [facility], [place] [name]. 04/22/2022 1:15 PM Medical Record Number: [id number] Patient Account Number: [id number] Date of Birth/Sex: Treating RN: 02/28/1966 (57 y.o. [name]: [name] Other Clinician: Referring [name]: Treating [name]/Extender: [name] in Treatment: 27 Subjective Chief Complaint Information obtained from Patient Sacral and back pressure ulcers History of Present Illness (HPI) The following HPI elements were documented for the patient's wound: Associated Signs and Symptoms: Patient has a history of confirmed osteomyelitis of the left foot according to MRI July 2019. 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 12/08/17 on evaluation today patient presents for initial evaluation and our clinic concerning issues he has been having with his feet bilaterally although the left more than right most recently. He was admitted to hospital where he was placed on IV vancomycin which actually he continue to utilize until discharge  and even when discharged continue to be on until around 16 August according to what he tells me. His PICC line was removed at that point. Nonetheless he has been seen in the wound care center in [facility] before transferring to [place] due to the fact that the doctor there left and they are no longer open. Nonetheless he does have significant osteomyelitis of the left foot noted on MRI which revealed significant issues with necrotic bone including a portion of the distal region of his left great toe which was felt to possibly either be missing as a result of amputation or potentially resorption. Either way he also had osteomyelitis noted of the digit, metatarsal region, cuneiform, and navicular bones. There is definite bone exposure noted externally at this point in time. In regard to the right foot he has issues with ulcerations here as well although he states that recently he's had no issues until this just blistered and reopened. Apparently Xeroform was used in this has caused things to be somewhat more moist and open more according to the patient. He's otherwise been tolerating the dressing changes without complication using silver alginate dressings. He has no discomfort but does seem to be extremely stressed regarding the fact that he did see Dr. 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 The patient stated per notes reviewed that he did not want to have any surgery and therefore has  a repeat appointment scheduled with the surgeon on 12/15/17. With all that being said he feels like that he really has not been alerted to what was going on in the severity that was until just recently and has a lot of questions in that regard. I'll be see I explained that seeing him for the first time today I cannot answer a lot of those questions that he has. 12/21/17 on evaluation today patient actually appears to be doing a little better in regard to the right foot in particular.  There is one area where there still definitive bone noted although the MRI which I did review today that we had ordered was negative for any evidence of osteomyelitis which is good news. Nonetheless there was some necrotic bone noted. Overall the patient appears to be doing fairly well however in regard to the right foot. Unfortunately he does have a new open area on the posterior aspect of his left leg as well as the continued area of necrosis noted in the central portion of the foot. He states that he's actually going to be sent for reevaluation specifically to a limb salvage clinic at [facility] he believes this is something that is primary care [name] has been working with. They were just awaiting the results of the MRI. 01/08/18 on evaluation today patient actually appears to be doing maybe just a little better in regard to the overall appearance of his bilateral foot ulcers. In general this does not seem to have worsened at least which is good news. He still has evidence of bone noted on the surface of both wound areas which though dry appearing really has not otherwise changed at this time. He actually has an appointment at [facility] with the wound center to discuss limb salvage. He discusses with me at the last visit. Nonetheless he was somewhat upset today about a couple things one being that he stated that we did not put the order in for the protective dressing for his knees that we discussed last time. With that being said I had put in the order for the protective dressing in fact it was on his order sheet that was signed on 12/21/17. Nonetheless unfortunately it appears this was overlooked by home health and they told him if they had the order they could put the protective dressing on. Nonetheless they told him they did not have the order. Again I believe this was an oversight nothing intentional on anyone's part nonetheless the order was there. Still I had explained to the patient sometimes  insurance will not cover for the protective dressing even if it is ordered and this was discussed in the last visit. 01/25/18 on evaluation today patient presents for follow-up concerning his bilateral lower extremity ulcers. He has actually been doing about the same since I last saw him. We do have them in the room we can look at his gluteal region today as well to ensure that there is nothing open or if so address the issue there. Nonetheless he was supposed to see you and see wound care/limb salvage prior to his visit with me today. Unfortunately when he arrived last time at their clinic he apparently was there on the wrong day the doctor was not even present that he was his to be seen. Nonetheless I had to be switched to a different day and the patient actually is going in the next week to see them. Assuming everything works out this should actually be tomorrow. Nonetheless if  they take over care of that point then we will subsequently release care to them. Readmission: 10-15-2021 upon evaluation today patient appears to be doing somewhat poorly in regard to wounds over the left gluteal region. He also has areas in the midline sacrum although this is not quite as bad. There is however some evidence of necrotic bone noted at this point unfortunately. The question is whether this is just something working its way out or if it something that is actually actively infected at this point. We are going to have to evaluate that further little by little as we go with additional diagnostics. With that being said the patient tells me currently that what we are seeing him for has been open since around 09-05-2021 for the gluteal area on the left and in regard to the sacrum 10-01-2021 he states. With that being said he again is not too significant as far as the overall appearance of [name], [name] ([id number]) [id number] Page 7 of 9 the wounds are concerned but again with the  bone exposed this is something that we may want to do at pathology and culture in regard to. 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 This is a gentleman whom I have previously seen back in 2019. The wound that we were taking care of back at that time is completely cleared. 7/25; 2-week follow-up. The patient has 3 small open areas in the lower sacrum and then an area over the left ischial tuberosity. Both of these are stage III wounds. He has been using Hydrofera Blue. 11-12-2021 upon evaluation today patient appears to be doing well currently in regard to his wounds. The distal sacral area is almost completely closed and looks to be doing great. In regard to the left gluteal region this is showing signs of improvement from a depth perspective size is about the same. Overall I am extremely pleased with where things stand compared to what we were previous. 12-03-2021 upon evaluation today patient appears to be doing well currently in regard to his wound. He is actually showing signs of improvement the sacral region and the right ischial/gluteal region is completely healed. The left ischial/gluteal region is still open but seems to be doing much better. 12-24-2021 upon evaluation today patient appears to be doing well currently in regard to some of his wounds others have reopened or are not doing quite as well. Overall though I think that he is on a good track he just may be needs to be a little bit better on appropriate offloading we discussed that today as well. 01-27-2022 upon evaluation today patient appears to be doing well currently in regard to his wound. He is not showing any signs of significant tissue breakdown but unfortunately the wound is larger than what it was last time I saw him. He tells me that this seems to be a complete shock based on what he is hearing today. He  tells me that his son was telling him this has been doing well and that it was smaller and not really bleeding or draining much. Again this is definitely significantly larger than last time I saw him but does not appear to be obviously infected there is some undermining noted as well. In general I am unsure of what would have caused such a dramatic changes what he seems to feel has happened here but nonetheless indeed I do  not feel like that this is doing nearly as well as last time I saw him. 11/6; his wound measured better today per our intake nurse using silver alginate and border foam although according to the patient were having trouble with supplies especially the border foam. He has home health. 02-24-2022 upon evaluation today patient appears to be doing better in regard to his wound. The sacral area is looking like it is trying to break down a little bit is not actually open right now but we definitely need to be careful in this regard. I discussed that with him today. With that being said the patient tells me that he definitely is not changing positions as frequently as he should and I think that is the primary cause of what we are seeing here. Fortunately there does not appear to be any signs of infection locally or systemically at this time. No fevers, chills, nausea, vomiting, or diarrhea. 03-11-2022 upon evaluation today patient is actually making excellent improvement in general. I am extremely pleased with where things stand I do believe that he is headed on the right direction here. 03-25-2022 upon evaluation today patient appears to be doing well currently in regard to his wounds for the most part although the sacral area appears to be a little bit more broken down than what we would have expected. Fortunately there does not appear to be any signs of infection he tells me that he is not exactly sure what happened that caused this to breakdown to the degree that it is. Fortunately there  does not appear to be any signs of active infection locally nor systemically at this time which is great news. No fevers, chills, nausea, vomiting, or diarrhea. 04-22-2022 upon evaluation today patient's wound actually appears to be doing well on the left and the gluteal fold location. With that being said in regard to the sacral area this is open and a little bit more slough covered at this point. Fortunately I do not see any signs of infection locally nor systemically which is great news and overall I am extremely pleased with where things stand today. Overall I am very happy with where we are and I do think that he can get the sacral area he will get again but we need to keep a close eye on everything for sure. He does tell me he is good to be more aggressive with appropriate offloading which I think is definitely a good way to go. Objective Constitutional Well-nourished and well-hydrated in no acute distress. Vitals Time Taken: 1:15 PM, Height: 73 in, Weight: 154 lbs, BMI: 20.3, Temperature: 98.1 F, Pulse: 90 bpm, Respiratory Rate: 18 breaths/min, Blood Pressure: 103/69 mmHg. Respiratory normal breathing without difficulty. Psychiatric this patient is able to make decisions and demonstrates good insight into disease process. Alert and Oriented x 3. pleasant and cooperative. General Notes: Upon inspection patient's wounds again did require sharp debridement in regard to the sacral area and he tolerated that without complication and postdebridement this is significantly improved. The wound in the left gluteal fold is healing quite nicely no debridement necessary. Integumentary (Hair, Skin) Wound #10 status is Open. Original cause of wound was Pressure Injury. The date acquired was: 04/18/2022. The wound is located on the Midline Sacrum. The wound measures 2cm length x 3.5cm width x 0.2cm depth; 5.498cm^2 area and 1.1cm^3 volume. There is Fat Layer (Subcutaneous Tissue) exposed. There is no  tunneling or undermining noted. There is a large amount of serous drainage noted. The  wound margin is flat and intact. There is medium (34-66%) red, hyper - granulation within the wound bed. There is a medium (34-66%) amount of necrotic tissue within the wound bed including Adherent Slough. Wound #8 status is Open. Original cause of wound was Pressure Injury. The date acquired was: 09/05/2021. The wound has been in treatment 27 weeks. The wound is located on the Left Gluteus. The wound measures 0.7cm length x 0.5cm width x 0.2cm depth; 0.275cm^2 area and 0.055cm^3 volume. There is Fat Layer (Subcutaneous Tissue) exposed. There is no undermining noted, however, there is tunneling at 11:00 with a maximum distance of 1cm. There is a medium amount of serosanguineous drainage noted. There is large (67-100%) red granulation within the wound bed. There is a small (1-33%) amount of necrotic tissue within the wound bed including Adherent Slough. General Notes: unable to visualize base of wound due to depth. Assessment [name], [name] ([id number]) [id number] Page 8 of 9 Active Problems ICD-10 Pressure ulcer of sacral region, stage 3 Pressure ulcer of other site, stage 3 Quadriplegia, C5-C7 incomplete Gastrostomy status Procedures Wound #10 Pre-procedure diagnosis of Wound #10 is a Pressure Ulcer located on the Midline Sacrum . There was a Excisional Skin/Subcutaneous Tissue Debridement with a total area of 7 sq cm performed by Tommie Sams., PA-C. With the following instrument(s): Curette to remove Viable and Non-Viable tissue/material. Material removed includes Subcutaneous Tissue and Slough and. No specimens were taken. A time out was conducted at 13:42, prior to the start of the procedure. A Moderate amount of bleeding was controlled with Pressure. The procedure was tolerated well. Post Debridement Measurements: 2cm length x 3.5cm width x 0.3cm depth; 1.649cm^3 volume.  Post debridement Stage noted as Category/Stage III. Character of Wound/Ulcer Post Debridement is stable. Post procedure Diagnosis Wound #10: Same as Pre-Procedure Plan Follow-up Appointments: Return Appointment in 2 weeks. Nurse Visit as needed Home Health: [facility]: - [place] for wound care. May utilize formulary equivalent dressing for wound treatment orders unless otherwise specified. Home Health Nurse may visit PRN to address patientoos wound care needs. Scheduled days for dressing changes to be completed; exception, patient has scheduled wound care visit that day. **Please direct any NON-WOUND related issues/requests for orders to patient's Primary Care Physician. **If current dressing causes regression in wound condition, may D/C ordered dressing product/s and apply Normal Saline Moist Dressing daily until next [place] or Other MD appointment. **Notify Wound Healing Center of regression in wound condition at [phone]. Bathing/ Shower/ Hygiene: Wash wounds with antibacterial soap and water. May shower; gently cleanse wound with antibacterial soap, rinse and pat dry prior to dressing wounds No tub bath. Off-Loading: Turn and reposition every 2 hours Other: - relive pressure from heels on bed WOUND #10: - Sacrum Wound Laterality: Midline Cleanser: Soap and Water (Generic) 3 x Per Week/30 Days Discharge Instructions: Gently cleanse wound with antibacterial soap, rinse and pat dry prior to dressing wounds Peri-Wound Care: Skin Prep ([place]) 3 x Per Week/30 Days Discharge Instructions: Use skin prep as directed Prim Dressing: Prisma 4.34 (in) (Home Health) (Dispense As Written) 3 x Per Week/30 Days ary Discharge Instructions: Moisten w/normal saline or sterile water; Cover wound as directed. Do not remove from wound bed. Secondary Dressing: (BORDER) Zetuvit Plus SILICONE BORDER Dressing 5x5 (in/in) (Home Health) (Dispense As  Written) 3 x Per Week/30 Days Discharge Instructions: Please do not put silicone bordered dressings under wraps. Use non-bordered dressing only. WOUND #8: - Gluteus Wound  Laterality: Left Cleanser: Soap and Water (Generic) 3 x Per Week/30 Days Discharge Instructions: Gently cleanse wound with antibacterial soap, rinse and pat dry prior to dressing wounds Peri-Wound Care: Skin Prep ([place]) 3 x Per Week/30 Days Discharge Instructions: Use skin prep as directed Prim Dressing: Prisma 4.34 (in) ([place]) (Dispense As Written) 3 x Per Week/30 Days ary Discharge Instructions: Moisten w/normal saline or sterile water; Cover wound as directed. Do not remove from wound bed. Secondary Dressing: (BORDER) Zetuvit Plus SILICONE BORDER Dressing 5x5 (in/in) (Home Health) (Dispense As Written) 3 x Per Week/30 Days Discharge Instructions: Please do not put silicone bordered dressings under wraps. Use non-bordered dressing only. 1. Based on what I am seeing I do believe that the patient would benefit from a continuation of therapy with a switch over to silver collagen however for both wounds see if how this will do. 2. I am also can recommend that the patient should continue to monitor for any signs of infection or worsening. Obviously if anything changes he knows contact the office and let me know but otherwise my hope is if he stays off of this the collagen would help get this moving in the right direction. If not then we may need to consider a skin substitute. We could also look into specifically Kerecis as an option here. We will see patient back for reevaluation in 2 weeks here in the clinic. If anything worsens or changes patient will contact our office for additional recommendations. Electronic Signature(s) Signed: 04/22/2022 2:36:11 PM By: Worthy Keeler PA-C Entered By: Worthy Keeler on 04/22/2022 14:36:11 [name] ([id number]) [id number] Page 9 of  9 -------------------------------------------------------------------------------- SuperBill Details Patient Name: Date of Service: [name], [name] 04/22/2022 Medical Record Number: [id number] Patient Account Number: [id number] Date of Birth/Sex: Treating RN: 12-29-1965 (57 y.o. [name]: [name] Other Clinician: Referring [name]: Treating [name]/Extender: [name] in Treatment: 27 Diagnosis Coding ICD-10 Codes Code Description [phone] Pressure ulcer of sacral region, stage 3 L89.893 Pressure ulcer of other site, stage 3 G82.54 Quadriplegia, C5-C7 incomplete Z93.1 Gastrostomy status Facility Procedures : CPT4 Code: [id number] Description: 15830 - DEB SUBQ TISSUE 20 SQ CM/< ICD-10 Diagnosis Description L89.153 Pressure ulcer of sacral region, stage 3 Modifier: Quantity: 1 Physician Procedures : CPT4 Code Description Modifier [id number] - WC PHYS SUBQ TISS 20 SQ CM ICD-10 Diagnosis Description L89.153 Pressure ulcer of sacral region, stage 3 Quantity: 1 Electronic Signature(s) Signed: 04/22/2022 2:36:19 PM By: Worthy Keeler PA-C Entered By: Worthy Keeler on 04/22/2022 14:36:19

## 2022-05-06 ENCOUNTER — Encounter: Payer: Medicare Other | Admitting: Physician Assistant

## 2022-05-06 DIAGNOSIS — L89153 Pressure ulcer of sacral region, stage 3: Secondary | ICD-10-CM | POA: Diagnosis not present

## 2022-05-06 NOTE — Progress Notes (Signed)
[name], [name] ([id number]) [id number] Page 1 of 9 Visit Report for 05/06/2022 Arrival Information Details Patient Name: Date of Service: [name], [name] 05/06/2022 12:30 PM Medical Record Number: [id number] Patient Account Number: [id number] Date of Birth/Sex: Treating RN: March 08, 1966 (57 y.o. [name] Primary Care Iness Pangilinan: [name] Other Clinician: Referring [name]: Treating [name]/Extender: [name] in Treatment: 29 Visit Information History Since Last Visit Added or deleted any medications: No Patient Arrived: Wheel Chair Any new allergies or adverse reactions: No Arrival Time: 12:39 Had a fall or experienced change in No Accompanied By: self activities of daily living that may affect Transfer Assistance: [name] risk of falls: Patient Identification Verified: Yes Hospitalized since last visit: No Secondary Verification Process Completed: Yes Has Dressing in Place as Prescribed: Yes Patient Requires Transmission-Based Precautions: No Pain Present Now: No Patient Has Alerts: No Electronic Signature(s) Signed: 05/06/2022 4:15:30 PM By: Colin Loud MSN RN CNS WTA Entered By: [name] on 05/06/2022 12:57:41 -------------------------------------------------------------------------------- Clinic Level of Care Assessment Details Patient Name: Date of Service: [name], [name] 05/06/2022 12:30 PM Medical Record Number: [id number] Patient Account Number: [id number] Date of Birth/Sex: Treating RN: 1965/09/02 (57 y.o. [name] Primary Care [name]: [name] Other Clinician: Referring [name]: Treating [name]/Extender: [name] in Treatment: 29 Clinic Level of Care Assessment Items TOOL 1 Quantity Score '[]'$  - 0 Use when EandM and Procedure is performed on INITIAL visit ASSESSMENTS - Nursing Assessment / Reassessment '[]'$  - 0 General Physical Exam (combine w/  comprehensive assessment (listed just below) when performed on new pt. evals) '[]'$  - 0 Comprehensive Assessment (HX, ROS, Risk Assessments, Wounds Hx, etc.) ASSESSMENTS - Wound and Skin Assessment / Reassessment '[]'$  - 0 Dermatologic / Skin Assessment (not related to wound area) ASSESSMENTS - Ostomy and/or Continence Assessment and Care NEECE, Dover [name] ([id number]) [id number] Page 2 of 9 '[]'$  - 0 Incontinence Assessment and Management '[]'$  - 0 Ostomy Care Assessment and Management (repouching, etc.) PROCESS - Coordination of Care '[]'$  - 0 Simple Patient / Family Education for ongoing care '[]'$  - 0 Complex (extensive) Patient / Family Education for ongoing care '[]'$  - 0 Staff obtains Programmer, systems, Records, T Results / Process Orders est '[]'$  - 0 Staff telephones HHA, Nursing Homes / Clarify orders / etc '[]'$  - 0 Routine Transfer to another Facility (non-emergent condition) '[]'$  - 0 Routine Hospital Admission (non-emergent condition) '[]'$  - 0 New Admissions / Biomedical engineer / Ordering NPWT Apligraf, etc. , '[]'$  - 0 Emergency Hospital Admission (emergent condition) PROCESS - Special Needs '[]'$  - 0 Pediatric / Minor Patient Management '[]'$  - 0 Isolation Patient Management '[]'$  - 0 Hearing / Language / Visual special needs '[]'$  - 0 Assessment of Community assistance (transportation, D/C planning, etc.) '[]'$  - 0 Additional assistance / Altered mentation '[]'$  - 0 Support Surface(s) Assessment (bed, cushion, seat, etc.) INTERVENTIONS - Miscellaneous '[]'$  - 0 External ear exam '[]'$  - 0 Patient Transfer (multiple staff / Civil Service fast streamer / Similar devices) '[]'$  - 0 Simple Staple / Suture removal (25 or less) '[]'$  - 0 Complex Staple / Suture removal (26 or more) '[]'$  - 0 Hypo/Hyperglycemic Management (do not check if billed separately) '[]'$  - 0 Ankle / Brachial Index (ABI) - do not check if billed separately Has the patient been seen at the hospital within the last three years:  Yes Total Score: 0 Level Of Care: ____ Electronic Signature(s) Signed: 05/06/2022 4:15:30 PM By: Colin Loud MSN RN CNS  WTA Entered By: [name] on 05/06/2022 13:07:11 -------------------------------------------------------------------------------- Encounter Discharge Information Details Patient Name: Date of Service: [name], [name] 05/06/2022 12:30 PM Medical Record Number: [id number] Patient Account Number: [id number] Date of Birth/Sex: Treating RN: 03-09-1966 (57 y.o. [name] Primary Care [name]: [name] Other Clinician: Referring [name]: Treating [name]/Extender: [name] in Treatment: 29 Encounter Discharge Information Items Discharge Condition: [name], [name] ([id number]) [id number] Page 3 of 9 Ambulatory Status: Wheelchair Discharge Destination: Home Transportation: Private Auto Accompanied By: self Schedule Follow-up Appointment: Yes Clinical Summary of Care: Electronic Signature(s) Signed: 05/06/2022 4:15:30 PM By: Colin Loud MSN RN CNS WTA Entered By: [name] on 05/06/2022 13:08:58 -------------------------------------------------------------------------------- Lower Extremity Assessment Details Patient Name: Date of Service: [name], [name] 05/06/2022 12:30 PM Medical Record Number: [id number] Patient Account Number: [id number] Date of Birth/Sex: Treating RN: [birth year] (57 y.o. [name] Primary Care Lynora Dymond: [name] Other Clinician: Referring Alcus Bradly: Treating [name]/Extender: [name] in Treatment: 29 Electronic Signature(s) Signed: 05/06/2022 4:15:30 PM By: Colin Loud MSN RN CNS WTA Entered By: [name] on 05/06/2022 12:59:28 -------------------------------------------------------------------------------- Multi Wound Chart Details Patient Name: Date of Service: [name], [name]. 05/06/2022 12:30 PM Medical  Record Number: [id number] Patient Account Number: [id number] Date of Birth/Sex: Treating RN: 12/23/1965 (57 y.o. [name] Primary Care Yomaira Solar: [name] Other Clinician: Referring Jahzeel Poythress: Treating Crystina Borrayo/Extender: [name] in Treatment: 29 Vital Signs Height(in): 73 Pulse(bpm): 99 Weight(lbs): 154 Blood Pressure(mmHg): 141/89 Body Mass Index(BMI): 20.3 Temperature(F): 98.7 Respiratory Rate(breaths/min): 16 [10:Photos:] [N/A:N/A [id number] Page 4 of 9] Midline Sacrum Left Gluteus N/A Wound Location: Pressure Injury Pressure Injury N/A Wounding Event: Pressure Ulcer Pressure Ulcer N/A Primary Etiology: Hypotension, Myocardial Infarction, Hypotension, Myocardial Infarction, N/A Comorbid History: History of pressure wounds, History of pressure wounds, Rheumatoid Arthritis, Paraplegia, Rheumatoid Arthritis, Paraplegia, Confinement Anxiety Confinement Anxiety 04/18/2022 09/05/2021 N/A Date Acquired: 2 29 N/A Weeks of Treatment: Open Open N/A Wound Status: No No N/A Wound Recurrence: 2.5x4.5x0.2 1x0.3x0.4 N/A Measurements L x W x D (cm) 8.836 0.236 N/A A (cm) : rea 1.767 0.094 N/A Volume (cm) : -60.70% 95.70% N/A % Reduction in A rea: -60.60% 98.30% N/A % Reduction in Volume: Category/Stage III Category/Stage III N/A Classification: Large Medium N/A Exudate A mount: Serous Serosanguineous N/A Exudate Type: amber red, brown N/A Exudate Color: Flat and Intact N/A N/A Wound Margin: Medium (34-66%) Large (67-100%) N/A Granulation A mount: Red, Hyper-granulation Red N/A Granulation Quality: Medium (34-66%) Small (1-33%) N/A Necrotic A mount: Fat Layer (Subcutaneous Tissue): Yes Fat Layer (Subcutaneous Tissue): Yes N/A Exposed Structures: Fascia: No Tendon: No Muscle: No Joint: No Bone: No None None N/A Epithelialization: Treatment Notes Electronic Signature(s) Signed: 05/06/2022 4:15:30 PM By:  Colin Loud MSN RN CNS WTA Entered By: [name] on 05/06/2022 12:59:33 -------------------------------------------------------------------------------- Multi-Disciplinary Care Plan Details Patient Name: Date of Service: [name], [place] [name]. 05/06/2022 12:30 PM Medical Record Number: [id number] Patient Account Number: [id number] Date of Birth/Sex: Treating RN: 1965/05/13 (57 y.o. [name] Primary Care Kieren Adkison: [name] Other Clinician: Referring Kayanna Mckillop: Treating Lavan Imes/Extender: [name] in Treatment: 29 Active Inactive Pressure Nursing Diagnoses: Knowledge deficit related to causes and risk factors for pressure ulcer development Knowledge deficit related to management of pressures ulcers Potential for impaired tissue integrity related to pressure, friction, moisture, and shear GoalsMillan Legan, Ollie [name] ([id number]) [id number] Page 5 of 9 Patient will remain free from development of additional  pressure ulcers Date Initiated: 12/03/2021 Target Resolution Date: 12/03/2021 Goal Status: Active Patient/caregiver will verbalize risk factors for pressure ulcer development Date Initiated: 12/03/2021 Target Resolution Date: 12/03/2021 Goal Status: Active Patient/caregiver will verbalize understanding of pressure ulcer management Date Initiated: 12/03/2021 Target Resolution Date: 12/03/2021 Goal Status: Active Interventions: Assess: immobility, friction, shearing, incontinence upon admission and as needed Assess offloading mechanisms upon admission and as needed Assess potential for pressure ulcer upon admission and as needed Provide education on pressure ulcers Notes: Wound/Skin Impairment Nursing Diagnoses: Impaired tissue integrity Knowledge deficit related to ulceration/compromised skin integrity Goals: Ulcer/skin breakdown will have a volume reduction of 30% by week 4 Date Initiated: 10/15/2021 Target Resolution  Date: 11/12/2021 Goal Status: Active Ulcer/skin breakdown will have a volume reduction of 50% by week 8 Date Initiated: 10/15/2021 Target Resolution Date: 12/10/2021 Goal Status: Active Ulcer/skin breakdown will have a volume reduction of 80% by week 12 Date Initiated: 10/15/2021 Target Resolution Date: 01/07/2022 Goal Status: Active Ulcer/skin breakdown will heal within 14 weeks Date Initiated: 10/15/2021 Target Resolution Date: 01/21/2022 Goal Status: Active Interventions: Assess patient/caregiver ability to obtain necessary supplies Assess patient/caregiver ability to perform ulcer/skin care regimen upon admission and as needed Assess ulceration(s) every visit Provide education on ulcer and skin care Notes: Electronic Signature(s) Signed: 05/06/2022 4:15:30 PM By: Colin Loud MSN RN CNS WTA Entered By: [name] on 05/06/2022 13:07:37 -------------------------------------------------------------------------------- Pain Assessment Details Patient Name: Date of Service: [name], Anav [name]. 05/06/2022 12:30 PM Medical Record Number: [id number] Patient Account Number: [id number] Date of Birth/Sex: Treating RN: Oct 07, 1965 (57 y.o. [name] Primary Care Lynnea Vandervoort: [name] Other Clinician: Referring Jules Baty: Treating Al Gagen/Extender: [name] in Treatment: [address] [name], [name] ([id number]) [id number] Page 6 of 9 Location of Pain Severity and Description of Pain Patient Has Paino No Site Locations Pain Management and Medication Current Pain Management: Electronic Signature(s) Signed: 05/06/2022 4:15:30 PM By: Colin Loud MSN RN CNS WTA Entered By: [name] on 05/06/2022 12:57:51 -------------------------------------------------------------------------------- Patient/Caregiver Education Details Patient Name: Date of Service: [name] 1/30/2024andnbsp12:30 PM Medical Record Number:  [id number] Patient Account Number: [id number] Date of Birth/Gender: Treating RN: 27-Feb-1966 (57 y.o. [name] Primary Care Physician: [name] Other Clinician: Referring Physician: Treating Physician/Extender: [name] in Treatment: 29 Education Assessment Education Provided To: Patient Education Topics Provided Wound/Skin Impairment: Handouts: Caring for Your Ulcer Methods: Explain/Verbal Responses: State content correctly Electronic Signature(s) Signed: 05/06/2022 4:15:30 PM By: Colin Loud MSN RN CNS WTA Entered By: [name] on 05/06/2022 13:07:33 [name] ([id number]) [id number] Page 7 of 9 -------------------------------------------------------------------------------- Wound Assessment Details Patient Name: Date of Service: [name], [name] 05/06/2022 12:30 PM Medical Record Number: [id number] Patient Account Number: [id number] Date of Birth/Sex: Treating RN: 12-29-1965 (57 y.o. [name] Primary Care Emylee Decelle: [name] Other Clinician: Referring Dyshaun Bonzo: Treating Royce Sciara/Extender: [name] in Treatment: 29 Wound Status Wound Number: 10 Primary Pressure Ulcer Etiology: Wound Location: Midline Sacrum Wound Open Wounding Event: Pressure Injury Status: Date Acquired: 04/18/2022 Comorbid Hypotension, Myocardial Infarction, History of pressure wounds, Weeks Of Treatment: 2 History: Rheumatoid Arthritis, Paraplegia, Confinement Anxiety Clustered Wound: No Wound Measurements Length: (cm) 2.5 Width: (cm) 4.5 Depth: (cm) 0.2 Area: (cm) 8.836 Volume: (cm) 1.767 % Reduction in Area: -60.7% % Reduction in Volume: -60.6% Epithelialization: None Wound Description Classification: Category/Stage III Wound Margin: Flat and Intact Exudate Amount: Large Exudate Type: Serous Exudate Color: amber Foul Odor After Cleansing:  No Slough/Fibrino Yes Wound Bed Granulation  Amount: Medium (34-66%) Exposed Structure Granulation Quality: Red, Hyper-granulation Fascia Exposed: No Necrotic Amount: Medium (34-66%) Fat Layer (Subcutaneous Tissue) Exposed: Yes Necrotic Quality: Adherent Slough Tendon Exposed: No Muscle Exposed: No Joint Exposed: No Bone Exposed: No Treatment Notes Wound #10 (Sacrum) Wound Laterality: Midline Cleanser Soap and Water Discharge Instruction: Gently cleanse wound with antibacterial soap, rinse and pat dry prior to dressing wounds Peri-Wound Care Skin Prep Discharge Instruction: Use skin prep as directed Topical Primary Dressing Prisma 4.34 (in) Discharge Instruction: Moisten w/normal saline or sterile water; Cover wound as directed. Do not remove from wound bed. Secondary Dressing (BORDER) Zetuvit Plus SILICONE BORDER Dressing 5x5 (in/in) Discharge Instruction: Please do not put silicone bordered dressings under wraps. Use non-bordered dressing only. [name], [name] ([id number]) [id number] Page 8 of 9 Secured With Compression Wrap Compression Stockings Environmental education officer) Signed: 05/06/2022 4:15:30 PM By: Colin Loud MSN RN CNS WTA Entered By: [name] on 05/06/2022 13:02:43 -------------------------------------------------------------------------------- Wound Assessment Details Patient Name: Date of Service: [name], [place] [name]. 05/06/2022 12:30 PM Medical Record Number: [id number] Patient Account Number: [id number] Date of Birth/Sex: Treating RN: March 17, 1966 (57 y.o. [name] Primary Care Liddie Chichester: [name] Other Clinician: Referring Tallula Grindle: Treating Makynzie Dobesh/Extender: [name] in Treatment: 29 Wound Status Wound Number: 8 Primary Pressure Ulcer Etiology: Wound Location: Left Gluteus Wound Open Wounding Event: Pressure Injury Status: Date Acquired: 09/05/2021 Comorbid Hypotension, Myocardial Infarction, History of pressure  wounds, Weeks Of Treatment: 29 History: Rheumatoid Arthritis, Paraplegia, Confinement Anxiety Clustered Wound: No Photos Wound Measurements Length: (cm) 1 Width: (cm) 0.3 Depth: (cm) 0.4 Area: (cm) 0.236 Volume: (cm) 0.094 % Reduction in Area: 95.7% % Reduction in Volume: 98.3% Epithelialization: None Wound Description Classification: Category/Stage III Exudate Amount: Medium Exudate Type: Serosanguineous Exudate Color: red, brown Foul Odor After Cleansing: No Slough/Fibrino No Wound Bed Granulation Amount: Large (67-100%) Exposed Structure Granulation Quality: Red Fat Layer (Subcutaneous Tissue) Exposed: Yes Necrotic Amount: Small (1-33%) Necrotic Quality: Adherent Munising, [name] ([id number]) [id number] Page 9 of 9 Treatment Notes Wound #8 (Gluteus) Wound Laterality: Left Cleanser Soap and Water Discharge Instruction: Gently cleanse wound with antibacterial soap, rinse and pat dry prior to dressing wounds Peri-Wound Care Skin Prep Discharge Instruction: Use skin prep as directed Topical Primary Dressing Prisma 4.34 (in) Discharge Instruction: Moisten w/normal saline or sterile water; Cover wound as directed. Do not remove from wound bed. Secondary Dressing (BORDER) Zetuvit Plus SILICONE BORDER Dressing 5x5 (in/in) Discharge Instruction: Please do not put silicone bordered dressings under wraps. Use non-bordered dressing only. Secured With Compression Wrap Compression Stockings Environmental education officer) Signed: 05/06/2022 4:15:30 PM By: Colin Loud MSN RN CNS WTA Entered By: [name] on 05/06/2022 12:59:10 -------------------------------------------------------------------------------- Vitals Details Patient Name: Date of Service: [name], Ryane [name]. 05/06/2022 12:30 PM Medical Record Number: [id number] Patient Account Number: [id number] Date of Birth/Sex: Treating RN: 1966/01/03 (57 y.o. [name] Primary Care Alannie Amodio: [name] Other Clinician: Referring Jamiesha Victoria: Treating Calvert Charland/Extender: [name] in Treatment: 29 Vital Signs Time Taken: 12:55 Temperature (F): 98.7 Height (in): 73 Pulse (bpm): 99 Weight (lbs): 154 Respiratory Rate (breaths/min): 16 Body Mass Index (BMI): 20.3 Blood Pressure (mmHg): 141/89 Reference Range: 80 - 120 mg / dl Electronic Signature(s) Signed: 05/06/2022 4:15:30 PM By: Colin Loud MSN RN CNS WTA Entered By: [name] on 05/06/2022 12:57:45

## 2022-05-06 NOTE — Progress Notes (Signed)
[name], Colin B ([id number]) [id number] Page 1 of 9 Visit Report for 05/06/2022 Chief Complaint Document Details Patient Name: Date of Service: [name], [name] 05/06/2022 12:30 PM Medical Record Number: [id number] Patient Account Number: [id number] Date of Birth/Sex: Treating RN: [birth year] (57 y.o. [name] Primary Care Provider: [name] Other Clinician: Referring Provider: Treating Provider/Extender: [name] in Treatment: 29 Information Obtained from: Patient Chief Complaint Sacral and back pressure ulcers Electronic Signature(s) Signed: 05/06/2022 1:08:26 PM By: Worthy Keeler PA-C Entered By: Worthy Keeler on 05/06/2022 13:08:26 -------------------------------------------------------------------------------- HPI Details Patient Name: Date of Service: [name], [place] B. 05/06/2022 12:30 PM Medical Record Number: [id number] Patient Account Number: [id number] Date of Birth/Sex: Treating RN: 1965-11-12 (57 y.o. [name] Primary Care Provider: [name] Other Clinician: Referring Provider: Treating Provider/Extender: [name] in Treatment: 29 History of Present Illness ssociated Signs and Symptoms: Patient has a history of confirmed osteomyelitis of the left foot according to MRI July 2019. 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 HPI Description: 12/08/17 on evaluation today patient presents for initial evaluation and our clinic concerning issues he has been having with his feet bilaterally although the left more than right most recently. He was admitted to hospital where he was placed on IV vancomycin which actually he continue to utilize until discharge and even when discharged continue to be on until around 16 August according to what he tells me. His PICC line was removed at that point. Nonetheless he has  been seen in the wound care center in [facility] before transferring to [place] due to the fact that the doctor there left and they are no longer open. Nonetheless he does have significant osteomyelitis of the left foot noted on MRI which revealed significant issues with necrotic bone including a portion of the distal region of his left great toe which was felt to possibly either be missing as a result of amputation or potentially resorption. Either way he also had osteomyelitis noted of the digit, metatarsal region, cuneiform, and navicular bones. There is definite bone exposure noted externally at this point in time. In regard to the right foot he has issues with ulcerations here as well although he states that recently he's had no issues until this just blistered and reopened. Apparently Xeroform was used in this has caused things to be somewhat more moist and open more according to the patient. He's otherwise been tolerating the dressing changes without complication using silver alginate dressings. He has no discomfort but does seem to be extremely stressed regarding the fact that he did see Dr. 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 The patient stated per notes reviewed that he did not want to have any surgery and therefore has a repeat appointment scheduled with the surgeon on 12/15/17. With all that being said he feels like that he really has not been alerted to what was going on in the severity that was until just recently and has a lot of questions in that regard. I'll be see I explained that seeing him for the first time today I cannot answer a lot of those questions that he has. Colin, [name] B ([id number]) [id number] Page 2 of 9 12/21/17 on evaluation today patient actually appears to be doing a little better in regard to the right foot in particular. There is one area where there  still definitive bone noted although the  MRI which I did review today that we had ordered was negative for any evidence of osteomyelitis which is good news. Nonetheless there was some necrotic bone noted. Overall the patient appears to be doing fairly well however in regard to the right foot. Unfortunately he does have a new open area on the posterior aspect of his left leg as well as the continued area of necrosis noted in the central portion of the foot. He states that he's actually going to be sent for reevaluation specifically to a limb salvage clinic at [facility] he believes this is something that is primary care provider has been working with. They were just awaiting the results of the MRI. 01/08/18 on evaluation today patient actually appears to be doing maybe just a little better in regard to the overall appearance of his bilateral foot ulcers. In general this does not seem to have worsened at least which is good news. He still has evidence of bone noted on the surface of both wound areas which though dry appearing really has not otherwise changed at this time. He actually has an appointment at [facility] with the wound center to discuss limb salvage. He discusses with me at the last visit. Nonetheless he was somewhat upset today about a couple things one being that he stated that we did not put the order in for the protective dressing for his knees that we discussed last time. With that being said I had put in the order for the protective dressing in fact it was on his order sheet that was signed on 12/21/17. Nonetheless unfortunately it appears this was overlooked by home health and they told him if they had the order they could put the protective dressing on. Nonetheless they told him they did not have the order. Again I believe this was an oversight nothing intentional on anyone's part nonetheless the order was there. Still I had explained to the patient sometimes insurance will not cover for the protective dressing even if it  is ordered and this was discussed in the last visit. 01/25/18 on evaluation today patient presents for follow-up concerning his bilateral lower extremity ulcers. He has actually been doing about the same since I last saw him. We do have them in the room we can look at his gluteal region today as well to ensure that there is nothing open or if so address the issue there. Nonetheless he was supposed to see you and see wound care/limb salvage prior to his visit with me today. Unfortunately when he arrived last time at their clinic he apparently was there on the wrong day the doctor was not even present that he was his to be seen. Nonetheless I had to be switched to a different day and the patient actually is going in the next week to see them. Assuming everything works out this should actually be tomorrow. Nonetheless if they take over care of that point then we will subsequently release care to them. Readmission: 10-15-2021 upon evaluation today patient appears to be doing somewhat poorly in regard to wounds over the left gluteal region. He also has areas in the midline sacrum although this is not quite as bad. There is however some evidence of necrotic bone noted at this point unfortunately. The question is whether this is just something working its way out or if it something that is actually actively infected at this point. We are going to have to evaluate that further  little by little as we go with additional diagnostics. With that being said the patient tells me currently that what we are seeing him for has been open since around 09-05-2021 for the gluteal area on the left and in regard to the sacrum 10-01-2021 he states. With that being said he again is not too significant as far as the overall appearance of the wounds are concerned but again with the bone exposed this is something that we may want to do at pathology and culture in regard to. He has previously been treated for osteomyelitis of the  sacral area. 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 This is a gentleman whom I have previously seen back in 2019. The wound that we were taking care of back at that time is completely cleared. 7/25; 2-week follow-up. The patient has 3 small open areas in the lower sacrum and then an area over the left ischial tuberosity. Both of these are stage III wounds. He has been using Hydrofera Blue. 11-12-2021 upon evaluation today patient appears to be doing well currently in regard to his wounds. The distal sacral area is almost completely closed and looks to be doing great. In regard to the left gluteal region this is showing signs of improvement from a depth perspective size is about the same. Overall I am extremely pleased with where things stand compared to what we were previous. 12-03-2021 upon evaluation today patient appears to be doing well currently in regard to his wound. He is actually showing signs of improvement the sacral region and the right ischial/gluteal region is completely healed. The left ischial/gluteal region is still open but seems to be doing much better. 12-24-2021 upon evaluation today patient appears to be doing well currently in regard to some of his wounds others have reopened or are not doing quite as well. Overall though I think that he is on a good track he just may be needs to be a little bit better on appropriate offloading we discussed that today as well. 01-27-2022 upon evaluation today patient appears to be doing well currently in regard to his wound. He is not showing any signs of significant tissue breakdown but unfortunately the wound is larger than what it was last time I saw him. He tells me that this seems to be a complete shock based on what he is hearing today. He tells me that his son was telling him this has been doing well and that it was smaller and not really bleeding or draining much. Again this is  definitely significantly larger than last time I saw him but does not appear to be obviously infected there is some undermining noted as well. In general I am unsure of what would have caused such a dramatic changes what he seems to feel has happened here but nonetheless indeed I do not feel like that this is doing nearly as well as last time I saw him. 11/6; his wound measured better today per our intake nurse using silver alginate and border foam although according to the patient were having trouble with supplies especially the border foam. He has home health. 02-24-2022 upon evaluation today patient appears to be doing better in regard to his wound. The sacral area is looking like it is trying to break down a little bit is not actually open right now but we definitely need to be careful in this regard. I discussed that with him today. With that being said  the patient tells me that he definitely is not changing positions as frequently as he should and I think that is the primary cause of what we are seeing here. Fortunately there does not appear to be any signs of infection locally or systemically at this time. No fevers, chills, nausea, vomiting, or diarrhea. 03-11-2022 upon evaluation today patient is actually making excellent improvement in general. I am extremely pleased with where things stand I do believe that he is headed on the right direction here. 03-25-2022 upon evaluation today patient appears to be doing well currently in regard to his wounds for the most part although the sacral area appears to be a little bit more broken down than what we would have expected. Fortunately there does not appear to be any signs of infection he tells me that he is not exactly sure what happened that caused this to breakdown to the degree that it is. Fortunately there does not appear to be any signs of active infection locally nor systemically at this time which is great news. No fevers, chills, nausea,  vomiting, or diarrhea. 04-22-2022 upon evaluation today patient's wound actually appears to be doing well on the left and the gluteal fold location. With that being said in regard to the sacral area this is open and a little bit more slough covered at this point. Fortunately I do not see any signs of infection locally nor systemically which is great news and overall I am extremely pleased with where things stand today. Overall I am very happy with where we are and I do think that he can get the sacral area he will get again but we need to keep a close eye on everything for sure. He does tell me he is good to be more aggressive with appropriate offloading which I think is definitely a good way to go. 05-06-2022 upon evaluation today patient appears to be doing well currently in regard to his wound on the sacral area though it is a little bit hyper granulated at this point unfortunately. Otherwise this seems to be doing overall decently well. In general I think that we are headed in the right direction. In regards to the wound in the left gluteal region this does have a little bit of depth to it but still seems to be showing signs of improvement which is great news and I am pleased in that regard. Electronic Signature(s) Signed: 05/06/2022 1:41:30 PM By: Worthy Keeler PA-C Entered By: Worthy Keeler on 05/06/2022 13:41:30 [name] ([id number]) [id number] Page 3 of 9 -------------------------------------------------------------------------------- Otelia Sergeant TISS Details Patient Name: Date of Service: LYON, DUMONT 05/06/2022 12:30 PM Medical Record Number: [id number] Patient Account Number: [id number] Date of Birth/Sex: Treating RN: April 20, 1965 (57 y.o. [name] Primary Care Provider: [name] Other Clinician: Referring Provider: Treating Provider/Extender: [name] in Treatment: 29 Procedure Performed for:  Wound #10 Midline Sacrum Performed By: Physician Tommie Sams., PA-C Post Procedure Diagnosis Same as Pre-procedure Notes Sacral area one stick chemical cauterization Electronic Signature(s) Signed: 05/06/2022 4:15:30 PM By: Rosalio Loud MSN RN CNS WTA Entered By: Rosalio Loud on 05/06/2022 13:06:49 -------------------------------------------------------------------------------- Physical Exam Details Patient Name: Date of Service: [address] B. 05/06/2022 12:30 PM Medical Record Number: [id number] Patient Account Number: [id number] Date of Birth/Sex: Treating RN: Aug 24, 1965 (57 y.o. [name] Primary Care Provider: [name] Other Clinician: Referring Provider: Treating Provider/Extender: [name] in Treatment: 29 Constitutional  Well-nourished and well-hydrated in no acute distress. Respiratory normal breathing without difficulty. Psychiatric this patient is able to make decisions and demonstrates good insight into disease process. Alert and Oriented x 3. pleasant and cooperative. Notes Upon evaluation patient's wound on the sacral area did require chemical cauterization with silver nitrate he tolerated this today without complication and post procedure this appears to be doing much better. In regard to the wound in the left gluteal region I think the collagen is doing a good job. Will get a continue as such at this point. Electronic Signature(s) Signed: 05/06/2022 1:41:55 PM By: Grier Rocher, Clarnce B ([id number]) [id number] Page 4 of 9 Entered By: Worthy Keeler on 05/06/2022 13:41:55 -------------------------------------------------------------------------------- Physician Orders Details Patient Name: Date of Service: OTTIS, VACHA 05/06/2022 12:30 PM Medical Record Number: [id number] Patient Account Number: [id number] Date of Birth/Sex: Treating RN: 14-Mar-1966 (57 y.o. [name] Primary Care Provider: [name] Other Clinician: Referring Provider: Treating Provider/Extender: [name] in Treatment: 29 Verbal / Phone Orders: No Diagnosis Coding Follow-up Appointments Return Appointment in 2 weeks. Nurse Visit as needed [place]: - [place] for wound care. May utilize formulary equivalent dressing for wound treatment orders unless otherwise specified. Home Health Nurse may visit PRN to address patients wound care needs. Scheduled days for dressing changes to be completed; exception, patient has scheduled wound care visit that day. **Please direct any NON-WOUND related issues/requests for orders to patient's Primary Care Physician. **If current dressing causes regression in wound condition, may D/C ordered dressing product/s and apply Normal Saline Moist Dressing daily until next [place] or Other MD appointment. **Notify [facility]aling Center of regression in wound condition at [phone]. Bathing/ [employer] wounds with antibacterial soap and water. May shower; gently cleanse wound with antibacterial soap, rinse and pat dry prior to dressing wounds No tub bath. Off-Loading Turn and reposition every 2 hours Other: - relive pressure from heels on bed Wound Treatment Wound #10 - Sacrum Wound Laterality: Midline Cleanser: Soap and Water (Generic) 3 x Per Week/30 Days Discharge Instructions: Gently cleanse wound with antibacterial soap, rinse and pat dry prior to dressing wounds Peri-Wound Care: Skin Prep ([place]) 3 x Per Week/30 Days Discharge Instructions: Use skin prep as directed Prim Dressing: Prisma 4.34 (in) (Home Health) (Dispense As Written) 3 x Per Week/30 Days ary Discharge Instructions: Moisten w/normal saline or sterile water; Cover wound as directed. Do not remove from wound bed. Secondary Dressing: (BORDER) Zetuvit Plus SILICONE BORDER Dressing 5x5  (in/in) (Home Health) (Dispense As Written) 3 x Per Week/30 Days Discharge Instructions: Please do not put silicone bordered dressings under wraps. Use non-bordered dressing only. Wound #8 - Gluteus Wound Laterality: Left Cleanser: Soap and Water (Generic) 3 x Per Week/30 Days Discharge Instructions: Gently cleanse wound with antibacterial soap, rinse and pat dry prior to dressing wounds Peri-Wound Care: Skin Prep ([place]) 3 x Per Week/30 Days Discharge Instructions: Use skin prep as directed Prim Dressing: Prisma 4.34 (in) (Home Health) (Dispense As Written) 3 x Per Week/30 Days ary Discharge Instructions: Moisten w/normal saline or sterile water; Cover wound as directed. Do not remove from wound bed. Secondary Dressing: (BORDER) Zetuvit Plus SILICONE BORDER Dressing 5x5 (in/in) (Home Health) (Dispense As Written) 3 x Per Week/30 Days Discharge Instructions: Please do not put silicone bordered dressings under wraps. Use non-bordered dressing only. KRYSTOPHER, KUENZEL B ([id number]) [id number] Page 5 of 9  Electronic Signature(s) Signed: 05/06/2022 4:15:30 PM By: Rosalio Loud MSN RN CNS WTA Signed: 05/06/2022 4:45:54 PM By: Worthy Keeler PA-C Entered By: Rosalio Loud on 05/06/2022 13:07:03 -------------------------------------------------------------------------------- Problem List Details Patient Name: Date of Service: [name], [place] B. 05/06/2022 12:30 PM Medical Record Number: [id number] Patient Account Number: [id number] Date of Birth/Sex: Treating RN: October 11, 1965 (57 y.o. [name] Primary Care Provider: [name] Other Clinician: Referring Provider: Treating Provider/Extender: [name] in Treatment: 29 Active Problems ICD-10 Encounter Code Description Active Date MDM Diagnosis L89.153 Pressure ulcer of sacral region, stage 3 10/15/2021 No Yes L89.893 Pressure ulcer of other site, stage 3 10/15/2021 No Yes G82.54  Quadriplegia, C5-C7 incomplete 10/15/2021 No Yes Z93.1 Gastrostomy status 10/15/2021 No Yes Inactive Problems Resolved Problems Electronic Signature(s) Signed: 05/06/2022 1:08:22 PM By: Worthy Keeler PA-C Entered By: Worthy Keeler on 05/06/2022 13:08:22 Progress Note Details -------------------------------------------------------------------------------- [name] ([id number]) [id number] Page 6 of 9 Patient Name: Date of Service: HODGE, STACHNIK 05/06/2022 12:30 PM Medical Record Number: [id number] Patient Account Number: [id number] Date of Birth/Sex: Treating RN: March 01, 1966 (57 y.o. [name] Primary Care Provider: [name] Other Clinician: Referring Provider: Treating Provider/Extender: [name] in Treatment: 29 Subjective Chief Complaint Information obtained from Patient Sacral and back pressure ulcers History of Present Illness (HPI) The following HPI elements were documented for the patient's wound: Associated Signs and Symptoms: Patient has a history of confirmed osteomyelitis of the left foot according to MRI July 2019. 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 12/08/17 on evaluation today patient presents for initial evaluation and our clinic concerning issues he has been having with his feet bilaterally although the left more than right most recently. He was admitted to hospital where he was placed on IV vancomycin which actually he continue to utilize until discharge and even when discharged continue to be on until around 16 August according to what he tells me. His PICC line was removed at that point. Nonetheless he has been seen in the wound care center in [facility] before transferring to [place] due to the fact that the doctor there left and they are no longer open. Nonetheless he does have significant osteomyelitis of the left  foot noted on MRI which revealed significant issues with necrotic bone including a portion of the distal region of his left great toe which was felt to possibly either be missing as a result of amputation or potentially resorption. Either way he also had osteomyelitis noted of the digit, metatarsal region, cuneiform, and navicular bones. There is definite bone exposure noted externally at this point in time. In regard to the right foot he has issues with ulcerations here as well although he states that recently he's had no issues until this just blistered and reopened. Apparently Xeroform was used in this has caused things to be somewhat more moist and open more according to the patient. He's otherwise been tolerating the dressing changes without complication using silver alginate dressings. He has no discomfort but does seem to be extremely stressed regarding the fact that he did see Dr. 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 The patient stated per notes reviewed that he did not want to have any surgery and therefore has a repeat appointment scheduled with the surgeon on 12/15/17. With all that being said he feels like that he really has  not been alerted to what was going on in the severity that was until just recently and has a lot of questions in that regard. I'll be see I explained that seeing him for the first time today I cannot answer a lot of those questions that he has. 12/21/17 on evaluation today patient actually appears to be doing a little better in regard to the right foot in particular. There is one area where there still definitive bone noted although the MRI which I did review today that we had ordered was negative for any evidence of osteomyelitis which is good news. Nonetheless there was some necrotic bone noted. Overall the patient appears to be doing fairly well however in regard to the right foot. Unfortunately he does have a new open area on the  posterior aspect of his left leg as well as the continued area of necrosis noted in the central portion of the foot. He states that he's actually going to be sent for reevaluation specifically to a limb salvage clinic at [facility] he believes this is something that is primary care provider has been working with. They were just awaiting the results of the MRI. 01/08/18 on evaluation today patient actually appears to be doing maybe just a little better in regard to the overall appearance of his bilateral foot ulcers. In general this does not seem to have worsened at least which is good news. He still has evidence of bone noted on the surface of both wound areas which though dry appearing really has not otherwise changed at this time. He actually has an appointment at [facility] with the wound center to discuss limb salvage. He discusses with me at the last visit. Nonetheless he was somewhat upset today about a couple things one being that he stated that we did not put the order in for the protective dressing for his knees that we discussed last time. With that being said I had put in the order for the protective dressing in fact it was on his order sheet that was signed on 12/21/17. Nonetheless unfortunately it appears this was overlooked by home health and they told him if they had the order they could put the protective dressing on. Nonetheless they told him they did not have the order. Again I believe this was an oversight nothing intentional on anyone's part nonetheless the order was there. Still I had explained to the patient sometimes insurance will not cover for the protective dressing even if it is ordered and this was discussed in the last visit. 01/25/18 on evaluation today patient presents for follow-up concerning his bilateral lower extremity ulcers. He has actually been doing about the same since I last saw him. We do have them in the room we can look at his gluteal region today as well to  ensure that there is nothing open or if so address the issue there. Nonetheless he was supposed to see you and see wound care/limb salvage prior to his visit with me today. Unfortunately when he arrived last time at their clinic he apparently was there on the wrong day the doctor was not even present that he was his to be seen. Nonetheless I had to be switched to a different day and the patient actually is going in the next week to see them. Assuming everything works out this should actually be tomorrow. Nonetheless if they take over care of that point then we will subsequently release care to them. Readmission: 10-15-2021 upon evaluation today patient  appears to be doing somewhat poorly in regard to wounds over the left gluteal region. He also has areas in the midline sacrum although this is not quite as bad. There is however some evidence of necrotic bone noted at this point unfortunately. The question is whether this is just something working its way out or if it something that is actually actively infected at this point. We are going to have to evaluate that further little by little as we go with additional diagnostics. With that being said the patient tells me currently that what we are seeing him for has been open since around 09-05-2021 for the gluteal area on the left and in regard to the sacrum 10-01-2021 he states. With that being said he again is not too significant as far as the overall appearance of the wounds are concerned but again with the bone exposed this is something that we may want to do at pathology and culture in regard to. 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 This is a gentleman whom I have previously seen back in 2019. The wound that we were taking care of back at that time is completely cleared. 7/25; 2-week follow-up. The patient has 3 small open areas in  the lower sacrum and then an area over the left ischial tuberosity. Both of these are stage III wounds. He has been using Hydrofera Blue. 11-12-2021 upon evaluation today patient appears to be doing well currently in regard to his wounds. The distal sacral area is almost completely closed and looks to be doing great. In regard to the left gluteal region this is showing signs of improvement from a depth perspective size is about the same. Overall I am extremely pleased with where things stand compared to what we were previous. 12-03-2021 upon evaluation today patient appears to be doing well currently in regard to his wound. He is actually showing signs of improvement the sacral region and the right ischial/gluteal region is completely healed. The left ischial/gluteal region is still open but seems to be doing much better. 12-24-2021 upon evaluation today patient appears to be doing well currently in regard to some of his wounds others have reopened or are not doing quite as well. Overall though I think that he is on a good track he just may be needs to be a little bit better on appropriate offloading we discussed that today as well. 01-27-2022 upon evaluation today patient appears to be doing well currently in regard to his wound. He is not showing any signs of significant tissue breakdown but unfortunately the wound is larger than what it was last time I saw him. He tells me that this seems to be a complete shock based on what he is hearing today. He tells me that his son was telling him this has been doing well and that it was smaller and not really bleeding or draining much. Again this is definitely significantly larger than last time I saw him but does not appear to be obviously infected there is some undermining noted as well. In general I am unsure of what would have caused such a dramatic changes what he seems to feel has happened here but nonetheless indeed I do not feel like that this is doing  nearly as well as last time I saw him. 11/6; his wound measured better today per our intake nurse using silver alginate and  border foam although according to the patient were having trouble with BRENSON, HARTMAN B ([id number]) [id number] Page 7 of 9 supplies especially the border foam. He has home health. 02-24-2022 upon evaluation today patient appears to be doing better in regard to his wound. The sacral area is looking like it is trying to break down a little bit is not actually open right now but we definitely need to be careful in this regard. I discussed that with him today. With that being said the patient tells me that he definitely is not changing positions as frequently as he should and I think that is the primary cause of what we are seeing here. Fortunately there does not appear to be any signs of infection locally or systemically at this time. No fevers, chills, nausea, vomiting, or diarrhea. 03-11-2022 upon evaluation today patient is actually making excellent improvement in general. I am extremely pleased with where things stand I do believe that he is headed on the right direction here. 03-25-2022 upon evaluation today patient appears to be doing well currently in regard to his wounds for the most part although the sacral area appears to be a little bit more broken down than what we would have expected. Fortunately there does not appear to be any signs of infection he tells me that he is not exactly sure what happened that caused this to breakdown to the degree that it is. Fortunately there does not appear to be any signs of active infection locally nor systemically at this time which is great news. No fevers, chills, nausea, vomiting, or diarrhea. 04-22-2022 upon evaluation today patient's wound actually appears to be doing well on the left and the gluteal fold location. With that being said in regard to the sacral area this is open and a little bit more  slough covered at this point. Fortunately I do not see any signs of infection locally nor systemically which is great news and overall I am extremely pleased with where things stand today. Overall I am very happy with where we are and I do think that he can get the sacral area he will get again but we need to keep a close eye on everything for sure. He does tell me he is good to be more aggressive with appropriate offloading which I think is definitely a good way to go. 05-06-2022 upon evaluation today patient appears to be doing well currently in regard to his wound on the sacral area though it is a little bit hyper granulated at this point unfortunately. Otherwise this seems to be doing overall decently well. In general I think that we are headed in the right direction. In regards to the wound in the left gluteal region this does have a little bit of depth to it but still seems to be showing signs of improvement which is great news and I am pleased in that regard. Objective Constitutional Well-nourished and well-hydrated in no acute distress. Vitals Time Taken: 12:55 PM, Height: 73 in, Weight: 154 lbs, BMI: 20.3, Temperature: 98.7 F, Pulse: 99 bpm, Respiratory Rate: 16 breaths/min, Blood Pressure: 141/89 mmHg. Respiratory normal breathing without difficulty. Psychiatric this patient is able to make decisions and demonstrates good insight into disease process. Alert and Oriented x 3. pleasant and cooperative. General Notes: Upon evaluation patient's wound on the sacral area did require chemical cauterization with silver nitrate he tolerated this today without complication and post procedure this appears to be doing much better. In regard to the wound in the  left gluteal region I think the collagen is doing a good job. Will get a continue as such at this point. Integumentary (Hair, Skin) Wound #10 status is Open. Original cause of wound was Pressure Injury. The date acquired was: 04/18/2022. The  wound has been in treatment 2 weeks. The wound is located on the Midline Sacrum. The wound measures 2.5cm length x 4.5cm width x 0.2cm depth; 8.836cm^2 area and 1.767cm^3 volume. There is Fat Layer (Subcutaneous Tissue) exposed. There is a large amount of serous drainage noted. The wound margin is flat and intact. There is medium (34-66%) red, hyper - granulation within the wound bed. There is a medium (34-66%) amount of necrotic tissue within the wound bed including Adherent Slough. Wound #8 status is Open. Original cause of wound was Pressure Injury. The date acquired was: 09/05/2021. The wound has been in treatment 29 weeks. The wound is located on the Left Gluteus. The wound measures 1cm length x 0.3cm width x 0.4cm depth; 0.236cm^2 area and 0.094cm^3 volume. There is Fat Layer (Subcutaneous Tissue) exposed. There is a medium amount of serosanguineous drainage noted. There is large (67-100%) red granulation within the wound bed. There is a small (1-33%) amount of necrotic tissue within the wound bed including Adherent Slough. Assessment Active Problems ICD-10 Pressure ulcer of sacral region, stage 3 Pressure ulcer of other site, stage 3 Quadriplegia, C5-C7 incomplete Gastrostomy status Procedures Wound #10 Pre-procedure diagnosis of Wound #10 is a Pressure Ulcer located on the Midline Sacrum . An CHEM CAUT GRANULATION TISS procedure was performed by Ubaldo Glassing Ba[place], Dujuan B ([id number]) [id number] Page 8 of 9 Post procedure Diagnosis Wound #10: Same as Pre-Procedure Notes: Sacral area one stick chemical cauterization Plan Follow-up Appointments: Return Appointment in 2 weeks. Nurse Visit as needed Home Health: [place]: - [facility] for wound care. May utilize formulary equivalent dressing for wound treatment orders unless otherwise specified. Home Health Nurse may visit PRN to address patientoos wound care  needs. Scheduled days for dressing changes to be completed; exception, patient has scheduled wound care visit that day. **Please direct any NON-WOUND related issues/requests for orders to patient's Primary Care Physician. **If current dressing causes regression in wound condition, may D/C ordered dressing product/s and apply Normal Saline Moist Dressing daily until next [place] or Other MD appointment. **Notify [facility] of regression in wound condition at [phone]. Bathing/ Shower/ Hygiene: Wash wounds with antibacterial soap and water. May shower; gently cleanse wound with antibacterial soap, rinse and pat dry prior to dressing wounds No tub bath. Off-Loading: Turn and reposition every 2 hours Other: - relive pressure from heels on bed WOUND #10: - Sacrum Wound Laterality: Midline Cleanser: Soap and Water (Generic) 3 x Per Week/30 Days Discharge Instructions: Gently cleanse wound with antibacterial soap, rinse and pat dry prior to dressing wounds Peri-Wound Care: Skin Prep ([place]) 3 x Per Week/30 Days Discharge Instructions: Use skin prep as directed Prim Dressing: Prisma 4.34 (in) (Home Health) (Dispense As Written) 3 x Per Week/30 Days ary Discharge Instructions: Moisten w/normal saline or sterile water; Cover wound as directed. Do not remove from wound bed. Secondary Dressing: (BORDER) Zetuvit Plus SILICONE BORDER Dressing 5x5 (in/in) (Home Health) (Dispense As Written) 3 x Per Week/30 Days Discharge Instructions: Please do not put silicone bordered dressings under wraps. Use non-bordered dressing only. WOUND #8: - Gluteus Wound Laterality: Left Cleanser: Soap and Water (Generic) 3 x Per Week/30 Days Discharge Instructions: Gently cleanse wound  with antibacterial soap, rinse and pat dry prior to dressing wounds Peri-Wound Care: Skin Prep ([place]) 3 x Per Week/30 Days Discharge Instructions: Use skin prep as directed Prim Dressing: Prisma 4.34  (in) ([place]) (Dispense As Written) 3 x Per Week/30 Days ary Discharge Instructions: Moisten w/normal saline or sterile water; Cover wound as directed. Do not remove from wound bed. Secondary Dressing: (BORDER) Zetuvit Plus SILICONE BORDER Dressing 5x5 (in/in) (Home Health) (Dispense As Written) 3 x Per Week/30 Days Discharge Instructions: Please do not put silicone bordered dressings under wraps. Use non-bordered dressing only. 1. I am going to recommend that we have the patient continue to monitor for any signs of infection or worsening. Overall I do believe that we are on the right track which is great news and I am going to suggest that we continue with the plan. Will see how the collagen is doing the patient feels like that is doing better in the sacral area especially compared to previous. 2. I am going to recommend as well that we have the patient continue with the bordered foam dressing to cover which seems to be doing well in regards to the sacral area. This also is helping the gluteal region as well. We will see patient back for reevaluation in 1 week here in the clinic. If anything worsens or changes patient will contact our office for additional recommendations. Electronic Signature(s) Signed: 05/06/2022 1:42:31 PM By: Worthy Keeler PA-C Entered By: Worthy Keeler on 05/06/2022 13:42:31 -------------------------------------------------------------------------------- SuperBill Details Patient Name: Date of Service: [name], [place] 05/06/2022 Medical Record Number: [id number] Patient Account Number: [id number] Date of Birth/Sex: Treating RN: Dec 19, 1965 (57 y.o. [name] Primary Care Provider: [name] Other Clinician: Referring Provider: Treating Provider/Extender: [name] in Treatment: [address], [place] B ([id number]) [id number] Page 9 of 9 Diagnosis Coding ICD-10 Codes Code Description L89.153  Pressure ulcer of sacral region, stage 3 L89.893 Pressure ulcer of other site, stage 3 G82.54 Quadriplegia, C5-C7 incomplete Z93.1 Gastrostomy status Facility Procedures : CPT4 Code: [id number] Description: 03500 - CHEM CAUT GRANULATION TISS ICD-10 Diagnosis Description L89.153 Pressure ulcer of sacral region, stage 3 Modifier: Quantity: 1 Physician Procedures : CPT4 Code Description Modifier [id number] - WC PHYS LEVEL 3 - EST PT 25 ICD-10 Diagnosis Description L89.153 Pressure ulcer of sacral region, stage 3 L89.893 Pressure ulcer of other site, stage 3 G82.54 Quadriplegia, C5-C7 incomplete Z93.1  Gastrostomy status Quantity: 1 : [id number] - WC PHYS CHEM CAUT GRAN TISSUE ICD-10 Diagnosis Description L89.153 Pressure ulcer of sacral region, stage 3 Quantity: 1 Electronic Signature(s) Signed: 05/06/2022 1:42:58 PM By: Worthy Keeler PA-C Entered By: Worthy Keeler on 05/06/2022 13:42:58

## 2022-05-20 ENCOUNTER — Encounter: Payer: Medicare Other | Attending: Physician Assistant | Admitting: Physician Assistant

## 2022-05-20 DIAGNOSIS — L89893 Pressure ulcer of other site, stage 3: Secondary | ICD-10-CM | POA: Diagnosis not present

## 2022-05-20 DIAGNOSIS — Z931 Gastrostomy status: Secondary | ICD-10-CM | POA: Diagnosis not present

## 2022-05-20 DIAGNOSIS — G8254 Quadriplegia, C5-C7 incomplete: Secondary | ICD-10-CM | POA: Insufficient documentation

## 2022-05-20 DIAGNOSIS — L89153 Pressure ulcer of sacral region, stage 3: Secondary | ICD-10-CM | POA: Insufficient documentation

## 2022-05-20 NOTE — Progress Notes (Signed)
ROCHELLE, WAJDA [name] ([id number]) [id number] Page 1 of 9 Visit Report for 05/20/2022 Chief Complaint Document Details Patient Name: Date of Service: [name], [name] 05/20/2022 2:15 PM Medical Record Number: [id number] Patient Account Number: [id number] Date of Birth/Sex: Treating RN: [birth year] (57 y.o. [name] Primary Care Provider: [name] Other Clinician: Referring Provider: Treating Provider/Extender: [name] in Treatment: 31 Information Obtained from: Patient Chief Complaint Sacral and back pressure ulcers Electronic Signature(s) Signed: 05/20/2022 2:20:01 PM By: Worthy Keeler PA-C Entered By: Worthy Keeler on 05/20/2022 14:20:01 -------------------------------------------------------------------------------- HPI Details Patient Name: Date of Service: [name], [name]. 05/20/2022 2:15 PM Medical Record Number: [id number] Patient Account Number: [id number] Date of Birth/Sex: Treating RN: [birth year] (57 y.o. [name] Primary Care Provider: [name] Other Clinician: Referring Provider: Treating Provider/Extender: [name] in Treatment: 31 History of Present Illness ssociated Signs and Symptoms: Patient has a history of confirmed osteomyelitis of the left foot according to MRI July 2019. 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 HPI Description: 12/08/17 on evaluation today patient presents for initial evaluation and our clinic concerning issues he has been having with his feet bilaterally although the left more than right most recently. He was admitted to hospital where he was placed on IV vancomycin which actually he continue to utilize until discharge and even when discharged continue to be on until around 16 August according to what he tells me. His PICC line was removed at that point. Nonetheless he has  been seen in the wound care center in [facility] before transferring to [place] due to the fact that the doctor there left and they are no longer open. Nonetheless he does have significant osteomyelitis of the left foot noted on MRI which revealed significant issues with necrotic bone including a portion of the distal region of his left great toe which was felt to possibly either be missing as a result of amputation or potentially resorption. Either way he also had osteomyelitis noted of the digit, metatarsal region, cuneiform, and navicular bones. There is definite bone exposure noted externally at this point in time. In regard to the right foot he has issues with ulcerations here as well although he states that recently he's had no issues until this just blistered and reopened. Apparently Xeroform was used in this has caused things to be somewhat more moist and open more according to the patient. He's otherwise been tolerating the dressing changes without complication using silver alginate dressings. He has no discomfort but does seem to be extremely stressed regarding the fact that he did see Dr. 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 The patient stated per notes reviewed that he did not want to have any surgery and therefore has a repeat appointment scheduled with the surgeon on 12/15/17. With all that being said he feels like that he really has not been alerted to what was going on in the severity that was until just recently and has a lot of questions in that regard. I'll be see I explained that seeing him for the first time today I cannot answer a lot of those questions that he has. [name], [name] ([id number]) [id number] Page 2 of 9 12/21/17 on evaluation today patient actually appears to be doing a little better in regard to the right foot in particular. There is one area where there  still definitive bone noted although the  MRI which I did review today that we had ordered was negative for any evidence of osteomyelitis which is good news. Nonetheless there was some necrotic bone noted. Overall the patient appears to be doing fairly well however in regard to the right foot. Unfortunately he does have a new open area on the posterior aspect of his left leg as well as the continued area of necrosis noted in the central portion of the foot. He states that he's actually going to be sent for reevaluation specifically to a limb salvage clinic at [facility] he believes this is something that is primary care provider has been working with. They were just awaiting the results of the MRI. 01/08/18 on evaluation today patient actually appears to be doing maybe just a little better in regard to the overall appearance of his bilateral foot ulcers. In general this does not seem to have worsened at least which is good news. He still has evidence of bone noted on the surface of both wound areas which though dry appearing really has not otherwise changed at this time. He actually has an appointment at [facility] with the wound center to discuss limb salvage. He discusses with me at the last visit. Nonetheless he was somewhat upset today about a couple things one being that he stated that we did not put the order in for the protective dressing for his knees that we discussed last time. With that being said I had put in the order for the protective dressing in fact it was on his order sheet that was signed on 12/21/17. Nonetheless unfortunately it appears this was overlooked by home health and they told him if they had the order they could put the protective dressing on. Nonetheless they told him they did not have the order. Again I believe this was an oversight nothing intentional on anyone's part nonetheless the order was there. Still I had explained to the patient sometimes insurance will not cover for the protective dressing even if it  is ordered and this was discussed in the last visit. 01/25/18 on evaluation today patient presents for follow-up concerning his bilateral lower extremity ulcers. He has actually been doing about the same since I last saw him. We do have them in the room we can look at his gluteal region today as well to ensure that there is nothing open or if so address the issue there. Nonetheless he was supposed to see you and see wound care/limb salvage prior to his visit with me today. Unfortunately when he arrived last time at their clinic he apparently was there on the wrong day the doctor was not even present that he was his to be seen. Nonetheless I had to be switched to a different day and the patient actually is going in the next week to see them. Assuming everything works out this should actually be tomorrow. Nonetheless if they take over care of that point then we will subsequently release care to them. Readmission: 10-15-2021 upon evaluation today patient appears to be doing somewhat poorly in regard to wounds over the left gluteal region. He also has areas in the midline sacrum although this is not quite as bad. There is however some evidence of necrotic bone noted at this point unfortunately. The question is whether this is just something working its way out or if it something that is actually actively infected at this point. We are going to have to evaluate that further  little by little as we go with additional diagnostics. With that being said the patient tells me currently that what we are seeing him for has been open since around 09-05-2021 for the gluteal area on the left and in regard to the sacrum 10-01-2021 he states. With that being said he again is not too significant as far as the overall appearance of the wounds are concerned but again with the bone exposed this is something that we may want to do at pathology and culture in regard to. He has previously been treated for osteomyelitis of the  sacral area. 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 This is a gentleman whom I have previously seen back in 2019. The wound that we were taking care of back at that time is completely cleared. 7/25; 2-week follow-up. The patient has 3 small open areas in the lower sacrum and then an area over the left ischial tuberosity. Both of these are stage III wounds. He has been using Hydrofera Blue. 11-12-2021 upon evaluation today patient appears to be doing well currently in regard to his wounds. The distal sacral area is almost completely closed and looks to be doing great. In regard to the left gluteal region this is showing signs of improvement from a depth perspective size is about the same. Overall I am extremely pleased with where things stand compared to what we were previous. 12-03-2021 upon evaluation today patient appears to be doing well currently in regard to his wound. He is actually showing signs of improvement the sacral region and the right ischial/gluteal region is completely healed. The left ischial/gluteal region is still open but seems to be doing much better. 12-24-2021 upon evaluation today patient appears to be doing well currently in regard to some of his wounds others have reopened or are not doing quite as well. Overall though I think that he is on a good track he just may be needs to be a little bit better on appropriate offloading we discussed that today as well. 01-27-2022 upon evaluation today patient appears to be doing well currently in regard to his wound. He is not showing any signs of significant tissue breakdown but unfortunately the wound is larger than what it was last time I saw him. He tells me that this seems to be a complete shock based on what he is hearing today. He tells me that his son was telling him this has been doing well and that it was smaller and not really bleeding or draining much. Again this is  definitely significantly larger than last time I saw him but does not appear to be obviously infected there is some undermining noted as well. In general I am unsure of what would have caused such a dramatic changes what he seems to feel has happened here but nonetheless indeed I do not feel like that this is doing nearly as well as last time I saw him. 11/6; his wound measured better today per our intake nurse using silver alginate and border foam although according to the patient were having trouble with supplies especially the border foam. He has home health. 02-24-2022 upon evaluation today patient appears to be doing better in regard to his wound. The sacral area is looking like it is trying to break down a little bit is not actually open right now but we definitely need to be careful in this regard. I discussed that with him today. With that being said  the patient tells me that he definitely is not changing positions as frequently as he should and I think that is the primary cause of what we are seeing here. Fortunately there does not appear to be any signs of infection locally or systemically at this time. No fevers, chills, nausea, vomiting, or diarrhea. 03-11-2022 upon evaluation today patient is actually making excellent improvement in general. I am extremely pleased with where things stand I do believe that he is headed on the right direction here. 03-25-2022 upon evaluation today patient appears to be doing well currently in regard to his wounds for the most part although the sacral area appears to be a little bit more broken down than what we would have expected. Fortunately there does not appear to be any signs of infection he tells me that he is not exactly sure what happened that caused this to breakdown to the degree that it is. Fortunately there does not appear to be any signs of active infection locally nor systemically at this time which is great news. No fevers, chills, nausea,  vomiting, or diarrhea. 04-22-2022 upon evaluation today patient's wound actually appears to be doing well on the left and the gluteal fold location. With that being said in regard to the sacral area this is open and a little bit more slough covered at this point. Fortunately I do not see any signs of infection locally nor systemically which is great news and overall I am extremely pleased with where things stand today. Overall I am very happy with where we are and I do think that he can get the sacral area he will get again but we need to keep a close eye on everything for sure. He does tell me he is good to be more aggressive with appropriate offloading which I think is definitely a good way to go. 05-06-2022 upon evaluation today patient appears to be doing well currently in regard to his wound on the sacral area though it is a little bit hyper granulated at this point unfortunately. Otherwise this seems to be doing overall decently well. In general I think that we are headed in the right direction. In regards to the wound in the left gluteal region this does have a little bit of depth to it but still seems to be showing signs of improvement which is great news and I am pleased in that regard. 05-20-2022 upon evaluation today patient appears to be doing better in regard to his wounds although he tells me he has been having to make to as he did not have the supplies ordered that he needed for dressing changes. Fortunately there does not appear to be any signs of active infection locally nor systemically which is great news. No fevers, chills, nausea, vomiting, or diarrhea. Electronic Signature(s) Signed: 05/20/2022 2:59:43 PM By: Worthy Keeler PA-C Entered By: Worthy Keeler on 05/20/2022 14:59:43 [name] ([id number]) [id number] Page 3 of 9 -------------------------------------------------------------------------------- CHEM CAUT GRANULATION TISS  Details Patient Name: Date of Service: LAVALE, SENDER 05/20/2022 2:15 PM Medical Record Number: [id number] Patient Account Number: [id number] Date of Birth/Sex: Treating RN: 1965-12-30 (57 y.o. [name] Primary Care Provider: [name] Other Clinician: Referring Provider: Treating Provider/Extender: [name] in Treatment: 31 Procedure Performed for: Wound #10 Midline Sacrum Performed By: Physician Tommie Sams., PA-C Post Procedure Diagnosis Same as Pre-procedure Notes 1 stick silver nitrate used midline sacrum Electronic Signature(s) Unsigned Entered By: Rosalio Loud on 05/20/2022  14:40:38 -------------------------------------------------------------------------------- Physical Exam Details Patient Name: Date of Service: [name], [name] 05/20/2022 2:15 PM Medical Record Number: [id number] Patient Account Number: [id number] Date of Birth/Sex: Treating RN: 06/10/1965 (57 y.o. [name] Primary Care Provider: [name] Other Clinician: Referring Provider: Treating Provider/Extender: [name] in Treatment: 75 Constitutional Well-nourished and well-hydrated in no acute distress. Respiratory normal breathing without difficulty. Psychiatric this patient is able to make decisions and demonstrates good insight into disease process. Alert and Oriented x 3. pleasant and cooperative. Notes Upon inspection patient's wound bed did require some chemical cauterization with silver nitrate in regard to the wound on his sacral region. This did very well for the last time I did go ahead and repeat that today. With that being said he does still have an opening on the hip location as well although this does not seem to be as deep as it was previous I feel like both wounds are actually showing signs of significant improvement. NAGEE, [name] ([id number]) [id number] Page 4 of 9 Electronic  Signature(s) Signed: 05/20/2022 3:02:05 PM By: Worthy Keeler PA-C Entered By: Worthy Keeler on 05/20/2022 15:02:05 -------------------------------------------------------------------------------- Physician Orders Details Patient Name: Date of Service: [name], [place] [name]. 05/20/2022 2:15 PM Medical Record Number: [id number] Patient Account Number: [id number] Date of Birth/Sex: Treating RN: October 30, 1965 (57 y.o. [name] Primary Care Provider: [name] Other Clinician: Referring Provider: Treating Provider/Extender: [name] in Treatment: 31 Verbal / Phone Orders: No Diagnosis Coding ICD-10 Coding Code Description L89.153 Pressure ulcer of sacral region, stage 3 L89.893 Pressure ulcer of other site, stage 3 G82.54 Quadriplegia, C5-C7 incomplete Z93.1 Gastrostomy status Follow-up Appointments Return Appointment in 2 weeks. Nurse Visit as needed [place]: - [place] for wound care. May utilize formulary equivalent dressing for wound treatment orders unless otherwise specified. Home Health Nurse may visit [facility] to address patients wound care needs. Scheduled days for dressing changes to be completed; exception, patient has scheduled wound care visit that day. **Please direct any NON-WOUND related issues/requests for orders to patient's Primary Care Physician. **If current dressing causes regression in wound condition, may D/C ordered dressing product/s and apply Normal Saline Moist Dressing daily until next [place] or Other MD appointment. **Notify [facility] of regression in wound condition at [phone]. Bathing/ [employer] wounds with antibacterial soap and water. May shower; gently cleanse wound with antibacterial soap, rinse and pat dry prior to dressing wounds No tub bath. Off-Loading Turn and reposition every 2 hours Other: - relive pressure from heels on bed Wound  Treatment Wound #10 - Sacrum Wound Laterality: Midline Cleanser: Soap and Water (Generic) 3 x Per Week/30 Days Discharge Instructions: Gently cleanse wound with antibacterial soap, rinse and pat dry prior to dressing wounds Peri-Wound Care: Skin Prep ([place]) 3 x Per Week/30 Days Discharge Instructions: Use skin prep as directed Prim Dressing: Prisma 4.34 (in) (Home Health) (Dispense As Written) 3 x Per Week/30 Days ary Discharge Instructions: Moisten w/normal saline or sterile water; Cover wound as directed. Do not remove from wound bed. Secondary Dressing: (BORDER) Zetuvit Plus SILICONE BORDER Dressing 5x5 (in/in) (Home Health) (Dispense As Written) 3 x Per Week/30 Days Discharge Instructions: Please do not put silicone bordered dressings under wraps. Use non-bordered dressing only. Wound #8 - Gluteus Wound Laterality: Left Cleanser: Soap and Water (Generic) 3 x Per Week/30 Days Discharge Instructions: Gently cleanse wound with antibacterial soap,  rinse and pat dry prior to dressing wounds Qaasim, Kellis Amman [name] ([id number]) [id number] Page 5 of 9 Peri-Wound Care: Skin Prep (Home Health) 3 x Per Week/30 Days Discharge Instructions: Use skin prep as directed Prim Dressing: Prisma 4.34 (in) (Home Health) (Dispense As Written) 3 x Per Week/30 Days ary Discharge Instructions: Moisten w/normal saline or sterile water; Cover wound as directed. Do not remove from wound bed. Secondary Dressing: (BORDER) Zetuvit Plus SILICONE BORDER Dressing 5x5 (in/in) (Home Health) (Dispense As Written) 3 x Per Week/30 Days Discharge Instructions: Please do not put silicone bordered dressings under wraps. Use non-bordered dressing only. Electronic Signature(s) Signed: 05/20/2022 5:36:51 PM By: Worthy Keeler PA-C Entered By: Rosalio Loud on 05/20/2022 14:41:13 -------------------------------------------------------------------------------- Problem List Details Patient Name: Date  of Service: [name], [place] [name]. 05/20/2022 2:15 PM Medical Record Number: [id number] Patient Account Number: [id number] Date of Birth/Sex: Treating RN: 07-19-1965 (57 y.o. [name] Primary Care Provider: [name] Other Clinician: Referring Provider: Treating Provider/Extender: [name] in Treatment: 31 Active Problems ICD-10 Encounter Code Description Active Date MDM Diagnosis L89.153 Pressure ulcer of sacral region, stage 3 10/15/2021 No Yes L89.893 Pressure ulcer of other site, stage 3 10/15/2021 No Yes G82.54 Quadriplegia, C5-C7 incomplete 10/15/2021 No Yes Z93.1 Gastrostomy status 10/15/2021 No Yes Inactive Problems Resolved Problems Electronic Signature(s) Signed: 05/20/2022 2:19:56 PM By: Worthy Keeler PA-C Entered By: Worthy Keeler on 05/20/2022 14:19:56 Oswaldo Milian, Hymie [name] ([id number]) [id number] Page 6 of 9 -------------------------------------------------------------------------------- Progress Note Details Patient Name: Date of Service: [name], [name] 05/20/2022 2:15 PM Medical Record Number: [id number] Patient Account Number: [id number] Date of Birth/Sex: Treating RN: [birth year] (57 y.o. [name] Primary Care Provider: [name] Other Clinician: Referring Provider: Treating Provider/Extender: [name] in Treatment: 31 Subjective Chief Complaint Information obtained from Patient Sacral and back pressure ulcers History of Present Illness (HPI) The following HPI elements were documented for the patient's wound: Associated Signs and Symptoms: Patient has a history of confirmed osteomyelitis of the left foot according to MRI July 2019. 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 12/08/17 on evaluation today patient presents for initial evaluation and our clinic concerning issues he has been having with his  feet bilaterally although the left more than right most recently. He was admitted to hospital where he was placed on IV vancomycin which actually he continue to utilize until discharge and even when discharged continue to be on until around 16 August according to what he tells me. His PICC line was removed at that point. Nonetheless he has been seen in the wound care center in [facility] before transferring to [place] due to the fact that the doctor there left and they are no longer open. Nonetheless he does have significant osteomyelitis of the left foot noted on MRI which revealed significant issues with necrotic bone including a portion of the distal region of his left great toe which was felt to possibly either be missing as a result of amputation or potentially resorption. Either way he also had osteomyelitis noted of the digit, metatarsal region, cuneiform, and navicular bones. There is definite bone exposure noted externally at this point in time. In regard to the right foot he has issues with ulcerations here as well although he states that recently he's had no issues until this just blistered and reopened. Apparently Xeroform was used in this has caused things to be  somewhat more moist and open more according to the patient. He's otherwise been tolerating the dressing changes without complication using silver alginate dressings. He has no discomfort but does seem to be extremely stressed regarding the fact that he did see Dr. 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 The patient stated per notes reviewed that he did not want to have any surgery and therefore has a repeat appointment scheduled with the surgeon on 12/15/17. With all that being said he feels like that he really has not been alerted to what was going on in the severity that was until just recently and has a lot of questions in that regard. I'll be see I explained that seeing him for the first time  today I cannot answer a lot of those questions that he has. 12/21/17 on evaluation today patient actually appears to be doing a little better in regard to the right foot in particular. There is one area where there still definitive bone noted although the MRI which I did review today that we had ordered was negative for any evidence of osteomyelitis which is good news. Nonetheless there was some necrotic bone noted. Overall the patient appears to be doing fairly well however in regard to the right foot. Unfortunately he does have a new open area on the posterior aspect of his left leg as well as the continued area of necrosis noted in the central portion of the foot. He states that he's actually going to be sent for reevaluation specifically to a limb salvage clinic at [facility] he believes this is something that is primary care provider has been working with. They were just awaiting the results of the MRI. 01/08/18 on evaluation today patient actually appears to be doing maybe just a little better in regard to the overall appearance of his bilateral foot ulcers. In general this does not seem to have worsened at least which is good news. He still has evidence of bone noted on the surface of both wound areas which though dry appearing really has not otherwise changed at this time. He actually has an appointment at [facility] with the wound center to discuss limb salvage. He discusses with me at the last visit. Nonetheless he was somewhat upset today about a couple things one being that he stated that we did not put the order in for the protective dressing for his knees that we discussed last time. With that being said I had put in the order for the protective dressing in fact it was on his order sheet that was signed on 12/21/17. Nonetheless unfortunately it appears this was overlooked by home health and they told him if they had the order they could put the protective dressing on. Nonetheless they told him  they did not have the order. Again I believe this was an oversight nothing intentional on anyone's part nonetheless the order was there. Still I had explained to the patient sometimes insurance will not cover for the protective dressing even if it is ordered and this was discussed in the last visit. 01/25/18 on evaluation today patient presents for follow-up concerning his bilateral lower extremity ulcers. He has actually been doing about the same since I last saw him. We do have them in the room we can look at his gluteal region today as well to ensure that there is nothing open or if so address the issue there. Nonetheless he was supposed to see you and see wound care/limb  salvage prior to his visit with me today. Unfortunately when he arrived last time at their clinic he apparently was there on the wrong day the doctor was not even present that he was his to be seen. Nonetheless I had to be switched to a different day and the patient actually is going in the next week to see them. Assuming everything works out this should actually be tomorrow. Nonetheless if they take over care of that point then we will subsequently release care to them. Readmission: 10-15-2021 upon evaluation today patient appears to be doing somewhat poorly in regard to wounds over the left gluteal region. He also has areas in the midline sacrum although this is not quite as bad. There is however some evidence of necrotic bone noted at this point unfortunately. The question is whether this is just something working its way out or if it something that is actually actively infected at this point. We are going to have to evaluate that further little by little as we go with additional diagnostics. With that being said the patient tells me currently that what we are seeing him for has been open since around 09-05-2021 for the gluteal area on the left and in regard to the sacrum 10-01-2021 he states. With that being said he again is not  too significant as far as the overall appearance of the wounds are concerned but again with the bone exposed this is something that we may want to do at pathology and culture in regard to. 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 This is a gentleman whom I have previously seen back in 2019. The wound that we were taking care of back at that time is completely cleared. 7/25; 2-week follow-up. The patient has 3 small open areas in the lower sacrum and then an area over the left ischial tuberosity. Both of these are stage III wounds. He has been using Hydrofera Blue. 11-12-2021 upon evaluation today patient appears to be doing well currently in regard to his wounds. The distal sacral area is almost completely closed and looks TOMINAGA, Donnavin [name] ([id number]) [id number] Page 7 of 9 to be doing great. In regard to the left gluteal region this is showing signs of improvement from a depth perspective size is about the same. Overall I am extremely pleased with where things stand compared to what we were previous. 12-03-2021 upon evaluation today patient appears to be doing well currently in regard to his wound. He is actually showing signs of improvement the sacral region and the right ischial/gluteal region is completely healed. The left ischial/gluteal region is still open but seems to be doing much better. 12-24-2021 upon evaluation today patient appears to be doing well currently in regard to some of his wounds others have reopened or are not doing quite as well. Overall though I think that he is on a good track he just may be needs to be a little bit better on appropriate offloading we discussed that today as well. 01-27-2022 upon evaluation today patient appears to be doing well currently in regard to his wound. He is not showing any signs of significant  tissue breakdown but unfortunately the wound is larger than what it was last time I saw him. He tells me that this seems to be a complete shock based on what he is hearing today. He tells me that his son was telling  him this has been doing well and that it was smaller and not really bleeding or draining much. Again this is definitely significantly larger than last time I saw him but does not appear to be obviously infected there is some undermining noted as well. In general I am unsure of what would have caused such a dramatic changes what he seems to feel has happened here but nonetheless indeed I do not feel like that this is doing nearly as well as last time I saw him. 11/6; his wound measured better today per our intake nurse using silver alginate and border foam although according to the patient were having trouble with supplies especially the border foam. He has home health. 02-24-2022 upon evaluation today patient appears to be doing better in regard to his wound. The sacral area is looking like it is trying to break down a little bit is not actually open right now but we definitely need to be careful in this regard. I discussed that with him today. With that being said the patient tells me that he definitely is not changing positions as frequently as he should and I think that is the primary cause of what we are seeing here. Fortunately there does not appear to be any signs of infection locally or systemically at this time. No fevers, chills, nausea, vomiting, or diarrhea. 03-11-2022 upon evaluation today patient is actually making excellent improvement in general. I am extremely pleased with where things stand I do believe that he is headed on the right direction here. 03-25-2022 upon evaluation today patient appears to be doing well currently in regard to his wounds for the most part although the sacral area appears to be a little bit more broken down than what we would have expected.  Fortunately there does not appear to be any signs of infection he tells me that he is not exactly sure what happened that caused this to breakdown to the degree that it is. Fortunately there does not appear to be any signs of active infection locally nor systemically at this time which is great news. No fevers, chills, nausea, vomiting, or diarrhea. 04-22-2022 upon evaluation today patient's wound actually appears to be doing well on the left and the gluteal fold location. With that being said in regard to the sacral area this is open and a little bit more slough covered at this point. Fortunately I do not see any signs of infection locally nor systemically which is great news and overall I am extremely pleased with where things stand today. Overall I am very happy with where we are and I do think that he can get the sacral area he will get again but we need to keep a close eye on everything for sure. He does tell me he is good to be more aggressive with appropriate offloading which I think is definitely a good way to go. 05-06-2022 upon evaluation today patient appears to be doing well currently in regard to his wound on the sacral area though it is a little bit hyper granulated at this point unfortunately. Otherwise this seems to be doing overall decently well. In general I think that we are headed in the right direction. In regards to the wound in the left gluteal region this does have a little bit of depth to it but still seems to be showing signs of improvement which is great news and I am pleased in that regard. 05-20-2022 upon evaluation today patient appears to be doing better in  regard to his wounds although he tells me he has been having to make to as he did not have the supplies ordered that he needed for dressing changes. Fortunately there does not appear to be any signs of active infection locally nor systemically which is great news. No fevers, chills, nausea, vomiting, or  diarrhea. Objective Constitutional Well-nourished and well-hydrated in no acute distress. Vitals Time Taken: 2:20 PM, Height: 73 in, Weight: 154 lbs, BMI: 20.3, Temperature: 98.0 F, Pulse: 56 bpm, Respiratory Rate: 16 breaths/min, Blood Pressure: 146/89 mmHg. Respiratory normal breathing without difficulty. Psychiatric this patient is able to make decisions and demonstrates good insight into disease process. Alert and Oriented x 3. pleasant and cooperative. General Notes: Upon inspection patient's wound bed did require some chemical cauterization with silver nitrate in regard to the wound on his sacral region. This did very well for the last time I did go ahead and repeat that today. With that being said he does still have an opening on the hip location as well although this does not seem to be as deep as it was previous I feel like both wounds are actually showing signs of significant improvement. Integumentary (Hair, Skin) Wound #10 status is Open. Original cause of wound was Pressure Injury. The date acquired was: 04/18/2022. The wound has been in treatment 4 weeks. The wound is located on the Midline Sacrum. The wound measures 2cm length x 1cm width x 0.1cm depth; 1.571cm^2 area and 0.157cm^3 volume. There is Fat Layer (Subcutaneous Tissue) exposed. There is a large amount of serous drainage noted. The wound margin is flat and intact. There is medium (34-66%) red, hyper - granulation within the wound bed. There is a medium (34-66%) amount of necrotic tissue within the wound bed including Adherent Slough. Wound #8 status is Open. Original cause of wound was Pressure Injury. The date acquired was: 09/05/2021. The wound has been in treatment 31 weeks. The wound is located on the Left Gluteus. The wound measures 0.3cm length x 0.3cm width x 0.2cm depth; 0.071cm^2 area and 0.014cm^3 volume. There is Fat Layer (Subcutaneous Tissue) exposed. There is tunneling at 11:00 with a maximum distance of  0.8cm. There is a medium amount of serosanguineous drainage noted. There is large (67-100%) red granulation within the wound bed. There is a small (1-33%) amount of necrotic tissue within the wound bed. Assessment [name], [name] ([id number]) [id number] Page 8 of 9 Active Problems ICD-10 Pressure ulcer of sacral region, stage 3 Pressure ulcer of other site, stage 3 Quadriplegia, C5-C7 incomplete Gastrostomy status Procedures Wound #10 Pre-procedure diagnosis of Wound #10 is a Pressure Ulcer located on the Midline Sacrum . An CHEM CAUT GRANULATION TISS procedure was performed by Tommie Sams., PA-C. Post procedure Diagnosis Wound #10: Same as Pre-Procedure Notes: 1 stick silver nitrate used midline sacrum Plan Follow-up Appointments: Return Appointment in 2 weeks. Nurse Visit as needed Home Health: [place]: - [place] for wound care. May utilize formulary equivalent dressing for wound treatment orders unless otherwise specified. Home Health Nurse may visit PRN to address patientoos wound care needs. Scheduled days for dressing changes to be completed; exception, patient has scheduled wound care visit that day. **Please direct any NON-WOUND related issues/requests for orders to patient's Primary Care Physician. **If current dressing causes regression in wound condition, may D/C ordered dressing product/s and apply Normal Saline Moist Dressing daily until next [place] or Other MD appointment. **Notify Wound Healing Center of regression in wound condition  at [phone]. Bathing/ Shower/ Hygiene: Wash wounds with antibacterial soap and water. May shower; gently cleanse wound with antibacterial soap, rinse and pat dry prior to dressing wounds No tub bath. Off-Loading: Turn and reposition every 2 hours Other: - relive pressure from heels on bed WOUND #10: - Sacrum Wound Laterality: Midline Cleanser: Soap  and Water (Generic) 3 x Per Week/30 Days Discharge Instructions: Gently cleanse wound with antibacterial soap, rinse and pat dry prior to dressing wounds Peri-Wound Care: Skin Prep ([place]) 3 x Per Week/30 Days Discharge Instructions: Use skin prep as directed Prim Dressing: Prisma 4.34 (in) (Home Health) (Dispense As Written) 3 x Per Week/30 Days ary Discharge Instructions: Moisten w/normal saline or sterile water; Cover wound as directed. Do not remove from wound bed. Secondary Dressing: (BORDER) Zetuvit Plus SILICONE BORDER Dressing 5x5 (in/in) (Home Health) (Dispense As Written) 3 x Per Week/30 Days Discharge Instructions: Please do not put silicone bordered dressings under wraps. Use non-bordered dressing only. WOUND #8: - Gluteus Wound Laterality: Left Cleanser: Soap and Water (Generic) 3 x Per Week/30 Days Discharge Instructions: Gently cleanse wound with antibacterial soap, rinse and pat dry prior to dressing wounds Peri-Wound Care: Skin Prep ([place]) 3 x Per Week/30 Days Discharge Instructions: Use skin prep as directed Prim Dressing: Prisma 4.34 (in) ([place]) (Dispense As Written) 3 x Per Week/30 Days ary Discharge Instructions: Moisten w/normal saline or sterile water; Cover wound as directed. Do not remove from wound bed. Secondary Dressing: (BORDER) Zetuvit Plus SILICONE BORDER Dressing 5x5 (in/in) (Home Health) (Dispense As Written) 3 x Per Week/30 Days Discharge Instructions: Please do not put silicone bordered dressings under wraps. Use non-bordered dressing only. 1. I am going to recommend currently that we have the patient continue to monitor for any signs of worsening or infection. Based on what I am seeing I do believe that he is making good progress. 2. I am going to suggest as well that the patient should continue to utilize the silver collagen which I think is doing a good job at both sites. 3. He should also continue the bordered foam dressing. 4.  Unfortunately he was having issues getting his dressings and I did discuss with him that I believe we can go ahead and see about getting in touch with [facility] to make sure that there is nothing they need from [place] to help and then getting the appropriate dressings. I am not sure exactly what happened there but nonetheless this has been a frustration for him. We will see patient back for reevaluation in 1 week here in the clinic. If anything worsens or changes patient will contact our office for additional recommendations. Electronic Signature(s) Signed: 05/20/2022 3:02:49 PM By: Worthy Keeler PA-C Entered By: Worthy Keeler on 05/20/2022 15:02:49 [name] ([id number]) [id number] Page 9 of 9 -------------------------------------------------------------------------------- SuperBill Details Patient Name: Date of Service: [name], [name] 05/20/2022 Medical Record Number: [id number] Patient Account Number: [id number] Date of Birth/Sex: Treating RN: Jun 08, 1965 (57 y.o. [name] Primary Care Provider: [name] Other Clinician: Referring Provider: Treating Provider/Extender: [name] in Treatment: 31 Diagnosis Coding ICD-10 Codes Code Description [phone] Pressure ulcer of sacral region, stage 3 L89.893 Pressure ulcer of other site, stage 3 G82.54 Quadriplegia, C5-C7 incomplete Z93.1 Gastrostomy status Facility Procedures : CPT4 Code: [id number] Description: [id number] - CHEM CAUT GRANULATION TISS ICD-10 Diagnosis Description L89.153 Pressure ulcer of sacral region, stage 3 Modifier: Quantity: 1 Physician Procedures : CPT4 Code Description Modifier [phone]  [id number] - WC PHYS LEVEL 4 - EST PT 25 ICD-10 Diagnosis Description L89.153 Pressure ulcer of sacral region, stage 3 L89.893 Pressure ulcer of other site, stage 3 G82.54 Quadriplegia, C5-C7 incomplete Z93.1  Gastrostomy status Quantity: 1 : [id number] - WC PHYS CHEM  CAUT GRAN TISSUE ICD-10 Diagnosis Description L89.153 Pressure ulcer of sacral region, stage 3 Quantity: 1 Electronic Signature(s) Signed: 05/20/2022 3:03:16 PM By: Worthy Keeler PA-C Entered By: Worthy Keeler on 05/20/2022 15:03:16

## 2022-05-20 NOTE — Progress Notes (Signed)
AARIT, DEPNER [name] ([id number]) [id number] Page 1 of 9 Visit Report for 05/20/2022 Arrival Information Details Patient Name: Date of Service: [name], [name] 05/20/2022 2:15 PM Medical Record Number: [id number] Patient Account Number: [id number] Date of Birth/Sex: Treating RN: [birth year] (58 y.o. [name] Primary Care [name]: [name] Other Clinician: Referring [name]: Treating [name]/Extender: [name] in Treatment: 45 Visit Information History Since Last Visit Added or deleted any medications: No Patient Arrived: Wheel Chair Any new allergies or adverse reactions: No Arrival Time: 14:07 Hospitalized since last visit: No Accompanied By: self Has Dressing in Place as Prescribed: Yes Transfer Assistance: Hoyer Lift Pain Present Now: No Patient Identification Verified: Yes Secondary Verification Process Completed: Yes Patient Requires Transmission-Based Precautions: No Patient Has Alerts: No Electronic Signature(s) Unsigned Entered By: [name] on 05/20/2022 14:20:21 -------------------------------------------------------------------------------- Clinic Level of Care Assessment Details Patient Name: Date of Service: [name], [name] 05/20/2022 2:15 PM Medical Record Number: [id number] Patient Account Number: [id number] Date of Birth/Sex: Treating RN: 1965-06-29 (57 y.o. [name] Primary Care [name]: [name] Other Clinician: Referring [name]: Treating [name]/Extender: [name] in Treatment: 31 Clinic Level of Care Assessment Items TOOL 1 Quantity Score []$  - 0 Use when EandM and Procedure is performed on INITIAL visit ASSESSMENTS - Nursing Assessment / Reassessment []$  - 0 General Physical Exam (combine w/ comprehensive assessment (listed just below) when performed on new pt. evals) []$  - 0 Comprehensive Assessment (HX, ROS, Risk Assessments, Wounds Hx,  etc.) ASSESSMENTS - Wound and Skin Assessment / Reassessment []$  - 0 Dermatologic / Skin Assessment (not related to wound area) [name], [name] ([id number]) [id number] Page 2 of 9 ASSESSMENTS - Ostomy and/or Continence Assessment and Care []$  - 0 Incontinence Assessment and Management []$  - 0 Ostomy Care Assessment and Management (repouching, etc.) PROCESS - Coordination of Care []$  - 0 Simple Patient / Family Education for ongoing care []$  - 0 Complex (extensive) Patient / Family Education for ongoing care []$  - 0 Staff obtains Consents, Records, T Results / Process Orders est []$  - 0 Staff telephones HHA, Nursing Homes / Clarify orders / etc []$  - 0 Routine Transfer to another Facility (non-emergent condition) []$  - 0 Routine Hospital Admission (non-emergent condition) []$  - 0 New Admissions / Biomedical engineer / Ordering NPWT Apligraf, etc. , []$  - 0 Emergency Hospital Admission (emergent condition) PROCESS - Special Needs []$  - 0 Pediatric / Minor Patient Management []$  - 0 Isolation Patient Management []$  - 0 Hearing / Language / Visual special needs []$  - 0 Assessment of Community assistance (transportation, D/C planning, etc.) []$  - 0 Additional assistance / Altered mentation []$  - 0 Support Surface(s) Assessment (bed, cushion, seat, etc.) INTERVENTIONS - Miscellaneous []$  - 0 External ear exam []$  - 0 Patient Transfer (multiple staff / Civil Service fast streamer / Similar devices) []$  - 0 Simple Staple / Suture removal (25 or less) []$  - 0 Complex Staple / Suture removal (26 or more) []$  - 0 Hypo/Hyperglycemic Management (do not check if billed separately) []$  - 0 Ankle / Brachial Index (ABI) - do not check if billed separately Has the patient been seen at the hospital within the last three years: Yes Total Score: 0 Level Of Care: ____ Electronic Signature(s) Unsigned Entered By: [name] on 05/20/2022  14:41:19 -------------------------------------------------------------------------------- Encounter Discharge Information Details Patient Name: Date of Service: [name], [name] 05/20/2022 2:15 PM Medical Record Number: [id number] Patient Account Number: [id number] Date of Birth/Sex: Treating  RN: 01/24/1966 (57 y.o. [name] Primary Care [name]: [name] Other Clinician: Referring Latrena Benegas: Treating [name]/Extender: [name] in Treatment: [name], [name] [id number] Page 3 of 9 Encounter Discharge Information Items Discharge Condition: Stable Ambulatory Status: Wheelchair Discharge Destination: Home Transportation: Private Auto Accompanied By: self Schedule Follow-up Appointment: Yes Clinical Summary of Care: Electronic Signature(s) Unsigned Entered By: [name] on 05/20/2022 14:43:20 -------------------------------------------------------------------------------- Lower Extremity Assessment Details Patient Name: Date of Service: [name] 05/20/2022 2:15 PM Medical Record Number: [id number] Patient Account Number: [id number] Date of Birth/Sex: Treating RN: 13-Jan-1966 (57 y.o. [name] Primary Care [name]: [name] Other Clinician: Referring [name]: Treating [name]/Extender: [name] in Treatment: 31 Electronic Signature(s) Unsigned Entered By: [name] on 05/20/2022 14:38:57 -------------------------------------------------------------------------------- Multi-Disciplinary Care Plan Details Patient Name: Date of Service: [name], [name]. 05/20/2022 2:15 PM Medical Record Number: [id number] Patient Account Number: [id number] Date of Birth/Sex: Treating RN: [birth year] (57 y.o. [name] Primary Care Jahmel Flannagan: [name] Other Clinician: Referring [name]: Treating [name]/Extender: [name] in  Treatment: 31 Active Inactive Pressure Nursing Diagnoses: [name], [name] ([id number]) [id number] Page 4 of 9 Knowledge deficit related to causes and risk factors for pressure ulcer development Knowledge deficit related to management of pressures ulcers Potential for impaired tissue integrity related to pressure, friction, moisture, and shear Goals: Patient will remain free from development of additional pressure ulcers Date Initiated: 12/03/2021 Target Resolution Date: 12/03/2021 Goal Status: Active Patient/caregiver will verbalize risk factors for pressure ulcer development Date Initiated: 12/03/2021 Target Resolution Date: 12/03/2021 Goal Status: Active Patient/caregiver will verbalize understanding of pressure ulcer management Date Initiated: 12/03/2021 Target Resolution Date: 12/03/2021 Goal Status: Active Interventions: Assess: immobility, friction, shearing, incontinence upon admission and as needed Assess offloading mechanisms upon admission and as needed Assess potential for pressure ulcer upon admission and as needed Provide education on pressure ulcers Notes: Wound/Skin Impairment Nursing Diagnoses: Impaired tissue integrity Knowledge deficit related to ulceration/compromised skin integrity Goals: Ulcer/skin breakdown will have a volume reduction of 30% by week 4 Date Initiated: 10/15/2021 Target Resolution Date: 11/12/2021 Goal Status: Active Ulcer/skin breakdown will have a volume reduction of 50% by week 8 Date Initiated: 10/15/2021 Target Resolution Date: 12/10/2021 Goal Status: Active Ulcer/skin breakdown will have a volume reduction of 80% by week 12 Date Initiated: 10/15/2021 Target Resolution Date: 01/07/2022 Goal Status: Active Ulcer/skin breakdown will heal within 14 weeks Date Initiated: 10/15/2021 Target Resolution Date: 01/21/2022 Goal Status: Active Interventions: Assess patient/caregiver ability to obtain necessary  supplies Assess patient/caregiver ability to perform ulcer/skin care regimen upon admission and as needed Assess ulceration(s) every visit Provide education on ulcer and skin care Notes: Electronic Signature(s) Unsigned Entered By: [name] on 05/20/2022 14:41:42 -------------------------------------------------------------------------------- Pain Assessment Details Patient Name: Date of Service: [name], [name] 05/20/2022 2:15 PM Medical Record Number: [id number] Patient Account Number: [id number] Date of Birth/Sex: Treating RN: 04-24-1965 (57 y.o. Doree Barthel, Obi [name] ([id number]) [phone].pdf Page 5 of 9 Primary Care Tianna Baus: [name] Other Clinician: Referring Wayland Baik: Treating Aiesha Leland/Extender: [name] in Treatment: 31 Active Problems Location of Pain Severity and Description of Pain Patient Has Paino No Site Locations Pain Management and Medication Current Pain Management: Electronic Signature(s) Unsigned Entered By: [name] on 05/20/2022 14:23:37 -------------------------------------------------------------------------------- Patient/Caregiver Education Details Patient Name: Date of Service: [name], [name] 2/13/2024andnbsp2:15 PM Medical Record Number: [id number] Patient Account Number: [id number] Date of Birth/Gender:  Treating RN: [birth year] (57 y.o. [name] Primary Care Physician: [name] Other Clinician: Referring Physician: Treating Physician/Extender: [name] in Treatment: 31 Education Assessment Education Provided To: Patient Education Topics Provided Wound/Skin Impairment: Handouts: Caring for Your Ulcer Methods: Explain/Verbal Responses: State content correctly Electronic Signature(s) [place], Sollie [name] ([id number]) [id number] Page 6 of 9 Unsigned Entered By: [name] on 05/20/2022  14:41:38 -------------------------------------------------------------------------------- Wound Assessment Details Patient Name: Date of Service: [name], [name] 05/20/2022 2:15 PM Medical Record Number: [id number] Patient Account Number: [id number] Date of Birth/Sex: Treating RN: Jul 15, 1965 (57 y.o. [name] Primary Care Cinnamon Morency: [name] Other Clinician: Referring Jamiah Homeyer: Treating Dajuana Palen/Extender: [name] in Treatment: 31 Wound Status Wound Number: 10 Primary Pressure Ulcer Etiology: Wound Location: Midline Sacrum Wound Open Wounding Event: Pressure Injury Status: Date Acquired: 04/18/2022 Comorbid Hypotension, Myocardial Infarction, History of pressure wounds, Weeks Of Treatment: 4 History: Rheumatoid Arthritis, Paraplegia, Confinement Anxiety Clustered Wound: No Photos Wound Measurements Length: (cm) 2 Width: (cm) 1 Depth: (cm) 0.1 Area: (cm) 1.571 Volume: (cm) 0.157 % Reduction in Area: 71.4% % Reduction in Volume: 85.7% Epithelialization: None Wound Description Classification: Category/Stage III Wound Margin: Flat and Intact Exudate Amount: Large Exudate Type: Serous Exudate Color: amber Foul Odor After Cleansing: No Slough/Fibrino Yes Wound Bed Granulation Amount: Medium (34-66%) Exposed Structure Granulation Quality: Red, Hyper-granulation Fascia Exposed: No Necrotic Amount: Medium (34-66%) Fat Layer (Subcutaneous Tissue) Exposed: Yes Necrotic Quality: Adherent Slough Tendon Exposed: No Muscle Exposed: No Joint Exposed: No Bone Exposed: No Treatment Notes Manna, Orenthal [name] ([id number]) [id number] Page 7 of 9 Wound #10 (Sacrum) Wound Laterality: Midline Cleanser Soap and Water Discharge Instruction: Gently cleanse wound with antibacterial soap, rinse and pat dry prior to dressing wounds Peri-Wound Care Skin Prep Discharge Instruction: Use skin prep as directed Topical Primary  Dressing Prisma 4.34 (in) Discharge Instruction: Moisten w/normal saline or sterile water; Cover wound as directed. Do not remove from wound bed. Secondary Dressing (BORDER) Zetuvit Plus SILICONE BORDER Dressing 5x5 (in/in) Discharge Instruction: Please do not put silicone bordered dressings under wraps. Use non-bordered dressing only. Secured With Compression Wrap Compression Stockings Add-Ons Electronic Signature(s) Unsigned Entered By: [name] on 05/20/2022 14:32:27 -------------------------------------------------------------------------------- Wound Assessment Details Patient Name: Date of Service: [name], [name] 05/20/2022 2:15 PM Medical Record Number: [id number] Patient Account Number: [id number] Date of Birth/Sex: Treating RN: 07/17/1965 (57 y.o. [name] Primary Care Ermel Verne: [name] Other Clinician: Referring Malyn Aytes: Treating Rettie Laird/Extender: [name] in Treatment: 31 Wound Status Wound Number: 8 Primary Pressure Ulcer Etiology: Wound Location: Left Gluteus Wound Open Wounding Event: Pressure Injury Status: Date Acquired: 09/05/2021 Comorbid Hypotension, Myocardial Infarction, History of pressure wounds, Weeks Of Treatment: 31 History: Rheumatoid Arthritis, Paraplegia, Confinement Anxiety Clustered Wound: No Photos [name], [name] ([id number]) [id number] Page 8 of 9 Wound Measurements Length: (cm) 0.3 Width: (cm) 0.3 Depth: (cm) 0.2 Area: (cm) 0.071 Volume: (cm) 0.014 % Reduction in Area: 98.7% % Reduction in Volume: 99.7% Epithelialization: None Tunneling: Yes Position (o'clock): 11 Maximum Distance: (cm) 0.8 Wound Description Classification: Category/Stage III Exudate Amount: Medium Exudate Type: Serosanguineous Exudate Color: red, brown Foul Odor After Cleansing: No Slough/Fibrino No Wound Bed Granulation Amount: Large (67-100%) Exposed Structure Granulation  Quality: Red Fat Layer (Subcutaneous Tissue) Exposed: Yes Necrotic Amount: Small (1-33%) Treatment Notes Wound #8 (Gluteus) Wound Laterality: Left Cleanser Soap and Water Discharge Instruction: Gently cleanse wound with antibacterial soap, rinse and pat dry prior to dressing wounds Peri-Wound  Care Skin Prep Discharge Instruction: Use skin prep as directed Topical Primary Dressing Prisma 4.34 (in) Discharge Instruction: Moisten w/normal saline or sterile water; Cover wound as directed. Do not remove from wound bed. Secondary Dressing (BORDER) Zetuvit Plus SILICONE BORDER Dressing 5x5 (in/in) Discharge Instruction: Please do not put silicone bordered dressings under wraps. Use non-bordered dressing only. Secured With Compression Wrap Compression Stockings Add-Ons Electronic Signature(s) Unsigned Entered By: [name] on 05/20/2022 14:38:52 Signature(s): [name] ([id number]) [id number] Date(s): [id number] Page 9 of 9 -------------------------------------------------------------------------------- Vitals Details Patient Name: Date of Service: [name], [name] 05/20/2022 2:15 PM Medical Record Number: [id number] Patient Account Number: [id number] Date of Birth/Sex: Treating RN: 02-18-1966 (57 y.o. [name] Primary Care Izela Altier: [name] Other Clinician: Referring Ilija Maxim: Treating Serin Thornell/Extender: [name] in Treatment: 31 Vital Signs Time Taken: 14:20 Temperature (F): 98.0 Height (in): 73 Pulse (bpm): 56 Weight (lbs): 154 Respiratory Rate (breaths/min): 16 Body Mass Index (BMI): 20.3 Blood Pressure (mmHg): 146/89 Reference Range: 80 - 120 mg / dl Electronic Signature(s) Unsigned Entered By: [name] on 05/20/2022 14:23:23 Signature(s): Date(s):

## 2022-05-22 DIAGNOSIS — I951 Orthostatic hypotension: Secondary | ICD-10-CM | POA: Insufficient documentation

## 2022-06-03 ENCOUNTER — Encounter: Payer: Medicare Other | Admitting: Physician Assistant

## 2022-06-03 DIAGNOSIS — L89153 Pressure ulcer of sacral region, stage 3: Secondary | ICD-10-CM | POA: Diagnosis not present

## 2022-06-04 NOTE — Progress Notes (Signed)
[name], [name] ([id number] Page 1 of 10 Visit Report for 06/03/2022 Arrival Information Details Patient Name: Date of Service: [name], [name] 06/03/2022 1:15 PM Medical Record Number: [id number] Patient Account Number: [id number] Date of Birth/Sex: Treating RN: June 28, 1965 (57 y.o. [name] Primary Care [name]: [name] Other Clinician: Referring [name]: Treating [name]/Extender: [name] in Treatment: 33 Visit Information History Since Last Visit Added or deleted any medications: No Patient Arrived: Wheel Chair Has Dressing in Place as Prescribed: Yes Arrival Time: 13:33 Pain Present Now: No Accompanied By: self Transfer Assistance: Speed Patient Identification Verified: Yes Secondary Verification Process Completed: Yes Patient Requires Transmission-Based Precautions: No Patient Has Alerts: No Electronic Signature(s) Signed: 06/03/2022 3:12:00 PM By: Colin Loud MSN RN CNS WTA Entered By: [name] on 06/03/2022 13:33:37 -------------------------------------------------------------------------------- Clinic Level of Care Assessment Details Patient Name: Date of Service: [name], [name] 06/03/2022 1:15 PM Medical Record Number: [id number] Patient Account Number: [id number] Date of Birth/Sex: Treating RN: Oct 05, 1965 (57 y.o. [name] Primary Care [name]: [name] Other Clinician: Referring [name]: Treating [name]/Extender: [name] in Treatment: 33 Clinic Level of Care Assessment Items TOOL 4 Quantity Score X- 1 0 Use when only an EandM is performed on FOLLOW-UP visit ASSESSMENTS - Nursing Assessment / Reassessment X- 1 10 Reassessment of Co-morbidities (includes updates in patient status) X- 1 5 Reassessment of Adherence to Treatment Plan ASSESSMENTS - Wound and Skin A ssessment / Reassessment '[]'$  - 0 Simple Wound Assessment /  Reassessment - one wound '[]'$  - 0 Complex Wound Assessment / Reassessment - multiple wounds [name], [name] ([id number] Page 2 of 10 '[]'$  - 0 Dermatologic / Skin Assessment (not related to wound area) ASSESSMENTS - Focused Assessment '[]'$  - 0 Circumferential Edema Measurements - multi extremities '[]'$  - 0 Nutritional Assessment / Counseling / Intervention '[]'$  - 0 Lower Extremity Assessment (monofilament, tuning fork, pulses) '[]'$  - 0 Peripheral Arterial Disease Assessment (using hand held doppler) ASSESSMENTS - Ostomy and/or Continence Assessment and Care '[]'$  - 0 Incontinence Assessment and Management '[]'$  - 0 Ostomy Care Assessment and Management (repouching, etc.) PROCESS - Coordination of Care X - Simple Patient / Family Education for ongoing care 1 15 '[]'$  - 0 Complex (extensive) Patient / Family Education for ongoing care X- 1 10 Staff obtains Programmer, systems, Records, T Results / Process Orders est '[]'$  - 0 Staff telephones HHA, Nursing Homes / Clarify orders / etc '[]'$  - 0 Routine Transfer to another Facility (non-emergent condition) '[]'$  - 0 Routine Hospital Admission (non-emergent condition) '[]'$  - 0 New Admissions / Biomedical engineer / Ordering NPWT Apligraf, etc. , '[]'$  - 0 Emergency Hospital Admission (emergent condition) X- 1 10 Simple Discharge Coordination '[]'$  - 0 Complex (extensive) Discharge Coordination PROCESS - Special Needs '[]'$  - 0 Pediatric / Minor Patient Management '[]'$  - 0 Isolation Patient Management '[]'$  - 0 Hearing / Language / Visual special needs '[]'$  - 0 Assessment of Community assistance (transportation, D/C planning, etc.) '[]'$  - 0 Additional assistance / Altered mentation '[]'$  - 0 Support Surface(s) Assessment (bed, cushion, seat, etc.) INTERVENTIONS - Wound Cleansing / Measurement X - Simple Wound Cleansing - one wound 1 5 '[]'$  - 0 Complex Wound Cleansing - multiple wounds X- 1 5 Wound Imaging (photographs - any number  of wounds) '[]'$  - 0 Wound Tracing (instead of photographs) X- 1 5 Simple Wound Measurement - one wound '[]'$  - 0 Complex Wound Measurement - multiple wounds INTERVENTIONS -  Wound Dressings X - Small Wound Dressing one or multiple wounds 1 10 '[]'$  - 0 Medium Wound Dressing one or multiple wounds '[]'$  - 0 Large Wound Dressing one or multiple wounds '[]'$  - 0 Application of Medications - topical '[]'$  - 0 Application of Medications - injection INTERVENTIONS - Miscellaneous '[]'$  - 0 External ear exam '[]'$  - 0 Specimen Collection (cultures, biopsies, blood, body fluids, etc.) '[]'$  - 0 Specimen(s) / Culture(s) sent or taken to Lab for analysis [name], [name] ([id number] Page 3 of 10 '[]'$  - 0 Patient Transfer (multiple staff / Civil Service fast streamer / Similar devices) '[]'$  - 0 Simple Staple / Suture removal (25 or less) '[]'$  - 0 Complex Staple / Suture removal (26 or more) '[]'$  - 0 Hypo / Hyperglycemic Management (close monitor of Blood Glucose) '[]'$  - 0 Ankle / Brachial Index (ABI) - do not check if billed separately X- 1 5 Vital Signs Has the patient been seen at the hospital within the last three years: Yes Total Score: 80 Level Of Care: New/Established - Level 3 Electronic Signature(s) Signed: 06/03/2022 3:12:00 PM By: Colin Loud MSN RN CNS WTA Entered By: [name] on 06/03/2022 14:38:58 -------------------------------------------------------------------------------- Encounter Discharge Information Details Patient Name: Date of Service: [name], [name]. 06/03/2022 1:15 PM Medical Record Number: [id number] Patient Account Number: [id number] Date of Birth/Sex: Treating RN: February 28, 1966 (57 y.o. [name] Primary Care [name]: [name] Other Clinician: Referring [name]: Treating [name]/Extender: [name] in Treatment: 33 Encounter Discharge Information Items Discharge Condition: Stable Ambulatory Status:  Wheelchair Discharge Destination: Home Transportation: Private Auto Accompanied By: self Schedule Follow-up Appointment: Yes Clinical Summary of Care: Electronic Signature(s) Signed: 06/03/2022 3:12:00 PM By: Colin Loud MSN RN CNS WTA Entered By: [name] on 06/03/2022 14:40:30 -------------------------------------------------------------------------------- Lower Extremity Assessment Details Patient Name: Date of Service: [name], [name] 06/03/2022 1:15 PM Medical Record Number: [id number] Patient Account Number: [id number] Date of Birth/Sex: Treating RN: 07-Dec-1965 (57 y.o. [name] Primary Care Kashae Carstens: [name] Other Clinician: Referring Tanasia Budzinski: Treating Jorita Bohanon/Extender: Bobbie Stack [place], Kavian [name] ([id number]) [id number] Page 4 of 10 Weeks in Treatment: 33 Electronic Signature(s) Signed: 06/03/2022 3:12:00 PM By: Colin Loud MSN RN CNS WTA Entered By: [name] on 06/03/2022 14:01:48 -------------------------------------------------------------------------------- Multi Wound Chart Details Patient Name: Date of Service: [name], [name]. 06/03/2022 1:15 PM Medical Record Number: [id number] Patient Account Number: [id number] Date of Birth/Sex: Treating RN: 1965/05/12 (57 y.o. [name] Primary Care Krisha Beegle: [name] Other Clinician: Referring Tradarius Reinwald: Treating Danice Dippolito/Extender: [name] in Treatment: 33 Vital Signs Height(in): 73 Pulse(bpm): 81 Weight(lbs): 154 Blood Pressure(mmHg): 105/79 Body Mass Index(BMI): 20.3 Temperature(F): 98.1 Respiratory Rate(breaths/min): 16 [10:Photos:] [N/A:N/A] Midline Sacrum Left Gluteus N/A Wound Location: Pressure Injury Pressure Injury N/A Wounding Event: Pressure Ulcer Pressure Ulcer N/A Primary Etiology: Hypotension, Myocardial Infarction, Hypotension, Myocardial Infarction, N/A Comorbid History: History of pressure  wounds, History of pressure wounds, Rheumatoid Arthritis, Paraplegia, Rheumatoid Arthritis, Paraplegia, Confinement Anxiety Confinement Anxiety 04/18/2022 09/05/2021 N/A Date Acquired: 6 33 N/A Weeks of Treatment: Healed - Epithelialized Open N/A Wound Status: No No N/A Wound Recurrence: 0x0x0 0.1x0.1x1 N/A Measurements L x W x D (cm) 0 0.008 N/A A (cm) : rea 0 0.008 N/A Volume (cm) : 100.00% 99.90% N/A % Reduction in A rea: 100.00% 99.90% N/A % Reduction in Volume: Category/Stage III Category/Stage III N/A Classification: Large Medium N/A Exudate A mount: Serous Serosanguineous N/A Exudate Type: amber red, brown N/A  Exudate Color: Flat and Intact N/A N/A Wound Margin: Medium (34-66%) Large (67-100%) N/A Granulation A mount: Red, Hyper-granulation Red N/A Granulation Quality: Medium (34-66%) Small (1-33%) N/A Necrotic A mount: Fat Layer (Subcutaneous Tissue): Yes Fat Layer (Subcutaneous Tissue): Yes N/A Exposed Structures: Fascia: No Tendon: No Muscle: No Joint: No Bone: No None None N/A EpithelializationRoderrick Groesbeck, Maximillian [name] ([id number] Page 5 of 10 Treatment Notes Electronic Signature(s) Signed: 06/03/2022 3:12:00 PM By: Colin Loud MSN RN CNS WTA Entered By: [name] on 06/03/2022 14:20:14 -------------------------------------------------------------------------------- Multi-Disciplinary Care Plan Details Patient Name: Date of Service: [name],  [name]. 06/03/2022 1:15 PM Medical Record Number: [id number] Patient Account Number: [id number] Date of Birth/Sex: Treating RN: 1965/12/04 (57 y.o. [name] Primary Care Xolani Degracia: [name] Other Clinician: Referring Jeannemarie Sawaya: Treating Kitt Minardi/Extender: [name] in Treatment: 33 Active Inactive Pressure Nursing Diagnoses: Knowledge deficit related to causes and risk factors for pressure ulcer development Knowledge deficit related  to management of pressures ulcers Potential for impaired tissue integrity related to pressure, friction, moisture, and shear Goals: Patient will remain free from development of additional pressure ulcers Date Initiated: 12/03/2021 Target Resolution Date: 12/03/2021 Goal Status: Active Patient/caregiver will verbalize risk factors for pressure ulcer development Date Initiated: 12/03/2021 Target Resolution Date: 12/03/2021 Goal Status: Active Patient/caregiver will verbalize understanding of pressure ulcer management Date Initiated: 12/03/2021 Target Resolution Date: 12/03/2021 Goal Status: Active Interventions: Assess: immobility, friction, shearing, incontinence upon admission and as needed Assess offloading mechanisms upon admission and as needed Assess potential for pressure ulcer upon admission and as needed Provide education on pressure ulcers Notes: Wound/Skin Impairment Nursing Diagnoses: Impaired tissue integrity Knowledge deficit related to ulceration/compromised skin integrity Goals: Ulcer/skin breakdown will have a volume reduction of 30% by week 4 Date Initiated: 10/15/2021 Target Resolution Date: 11/12/2021 Goal Status: Active Ulcer/skin breakdown will have a volume reduction of 50% by week 8 Date Initiated: 10/15/2021 Target Resolution Date: 12/10/2021 Goal Status: Active Ulcer/skin breakdown will have a volume reduction of 80% by week 12 [name], [name] ([id number] Page 6 of 10 Date Initiated: 10/15/2021 Target Resolution Date: 01/07/2022 Goal Status: Active Ulcer/skin breakdown will heal within 14 weeks Date Initiated: 10/15/2021 Target Resolution Date: 01/21/2022 Goal Status: Active Interventions: Assess patient/caregiver ability to obtain necessary supplies Assess patient/caregiver ability to perform ulcer/skin care regimen upon admission and as needed Assess ulceration(s) every visit Provide education on ulcer and skin  care Notes: Electronic Signature(s) Signed: 06/03/2022 3:12:00 PM By: Colin Loud MSN RN CNS WTA Entered By: [name] on 06/03/2022 14:39:19 -------------------------------------------------------------------------------- Pain Assessment Details Patient Name: Date of Service: [name], [name]. 06/03/2022 1:15 PM Medical Record Number: [id number] Patient Account Number: [id number] Date of Birth/Sex: Treating RN: 09-11-1965 (57 y.o. [name] Primary Care Zian Mohamed: [name] Other Clinician: Referring Kilian Schwartz: Treating Freida Nebel/Extender: [name] in Treatment: 33 Active Problems Location of Pain Severity and Description of Pain Patient Has Paino No Site Locations Pain Management and Medication Current Pain Management: Electronic Signature(s) Signed: 06/03/2022 3:12:00 PM By: Colin Loud MSN RN CNS WTA Entered By: [name] on 06/03/2022 13:53:32 [name], Johnedward [name] ([id number] Page 7 of 10 -------------------------------------------------------------------------------- Patient/Caregiver Education Details Patient Name: Date of Service: [name], [name] 2/27/2024andnbsp1:15 PM Medical Record Number: [id number] Patient Account Number: [id number] Date of Birth/Gender: Treating RN: 1965/10/21 (57 y.o. [name] Primary Care Physician: [name] Other Clinician: Referring Physician: Treating Physician/Extender: [name] in Treatment: 33 Education Assessment  Education Provided To: Patient Education Topics Provided Wound/Skin Impairment: Handouts: Caring for Your Ulcer Methods: Explain/Verbal Responses: State content correctly Electronic Signature(s) Signed: 06/03/2022 3:12:00 PM By: Colin Loud MSN RN CNS WTA Entered By: [name] on 06/03/2022 14:39:53 -------------------------------------------------------------------------------- Wound Assessment  Details Patient Name: Date of Service: [name], [name]. 06/03/2022 1:15 PM Medical Record Number: [id number] Patient Account Number: [id number] Date of Birth/Sex: Treating RN: Aug 30, 1965 (57 y.o. [name] Primary Care Monet North: [name] Other Clinician: Referring Eugina Row: Treating Aniken Monestime/Extender: [name] in Treatment: 33 Wound Status Wound Number: 10 Primary Pressure Ulcer Etiology: Wound Location: Midline Sacrum Wound Healed - Epithelialized Wounding Event: Pressure Injury Status: Date Acquired: 04/18/2022 Comorbid Hypotension, Myocardial Infarction, History of pressure wounds, Weeks Of Treatment: 6 History: Rheumatoid Arthritis, Paraplegia, Confinement Anxiety Clustered Wound: No Photos ZEF, BRUSSO [name] ([id number] Page 8 of 10 Wound Measurements Length: (cm) Width: (cm) Depth: (cm) Area: (cm) Volume: (cm) 0 % Reduction in Area: 100% 0 % Reduction in Volume: 100% 0 Epithelialization: None 0 0 Wound Description Classification: Category/Stage III Wound Margin: Flat and Intact Exudate Amount: Large Exudate Type: Serous Exudate Color: amber Foul Odor After Cleansing: No Slough/Fibrino Yes Wound Bed Granulation Amount: Medium (34-66%) Exposed Structure Granulation Quality: Red, Hyper-granulation Fascia Exposed: No Necrotic Amount: Medium (34-66%) Fat Layer (Subcutaneous Tissue) Exposed: Yes Tendon Exposed: No Muscle Exposed: No Joint Exposed: No Bone Exposed: No Treatment Notes Wound #10 (Sacrum) Wound Laterality: Midline Cleanser Peri-Wound Care Topical Primary Dressing Secondary Dressing Secured With Compression Wrap Compression Stockings Add-Ons Electronic Signature(s) Signed: 06/03/2022 3:12:00 PM By: Colin Loud MSN RN CNS WTA Entered By: [name] on 06/03/2022 14:03:56 Wound Assessment  Details -------------------------------------------------------------------------------- [name] ([id number] Page 9 of 10 Patient Name: Date of Service: [name], [name] 06/03/2022 1:15 PM Medical Record Number: [id number] Patient Account Number: [id number] Date of Birth/Sex: Treating RN: [birth year] (57 y.o. [name] Primary Care Jeslynn Hollander: [name] Other Clinician: Referring Daquann Merriott: Treating Curlee Bogan/Extender: [name] in Treatment: 33 Wound Status Wound Number: 8 Primary Pressure Ulcer Etiology: Wound Location: Left Gluteus Wound Open Wounding Event: Pressure Injury Status: Date Acquired: 09/05/2021 Comorbid Hypotension, Myocardial Infarction, History of pressure wounds, Weeks Of Treatment: 33 History: Rheumatoid Arthritis, Paraplegia, Confinement Anxiety Clustered Wound: No Photos Wound Measurements Length: (cm) 0.1 % Reduction in Area: 99.9% Width: (cm) 0.1 % Reduction in Volume: 99.9% Depth: (cm) 1 Epithelialization: None Area: (cm) 0.008 Volume: (cm) 0.008 Wound Description Classification: Category/Stage III Foul Odor After Cleansing: No Exudate Amount: Medium Slough/Fibrino No Exudate Type: Serosanguineous Exudate Color: red, brown Wound Bed Granulation Amount: Large (67-100%) Exposed Structure Granulation Quality: Red Fat Layer (Subcutaneous Tissue) Exposed: Yes Necrotic Amount: Small (1-33%) Treatment Notes Wound #8 (Gluteus) Wound Laterality: Left Cleanser Soap and Water Discharge Instruction: Gently cleanse wound with antibacterial soap, rinse and pat dry prior to dressing wounds Peri-Wound Care Skin Prep Discharge Instruction: Use skin prep as directed Topical Primary Dressing Prisma 4.34 (in) Discharge Instruction: Moisten w/normal saline or sterile water; Cover wound as directed. Do not remove from wound bed. Secondary Dressing (BORDER) Zetuvit Plus SILICONE  BORDER Dressing 5x5 (in/in) Discharge Instruction: Please do not put silicone bordered dressings under wraps. Use non-bordered dressing only. Secured With Compression Wrap Compression Stockings ANTWOINE, MCGUFFIE [name] ([id number]) [id number] Page 10 of 10 Add-Ons Electronic Signature(s) Signed: 06/03/2022 3:12:00 PM By: Colin Loud MSN RN CNS WTA Entered By: [name] on 06/03/2022 14:00:46 -------------------------------------------------------------------------------- Vitals Details Patient Name: Date  of Service: TAHJEE, MONDAY 06/03/2022 1:15 PM Medical Record Number: [id number] Patient Account Number: [id number] Date of Birth/Sex: Treating RN: 10-21-1965 (57 y.o. [name] Primary Care Trulee Hamstra: [name] Other Clinician: Referring Jeremian Whitby: Treating Kharson Rasmusson/Extender: [name] in Treatment: 33 Vital Signs Time Taken: 13:40 Temperature (F): 98.1 Height (in): 73 Pulse (bpm): 79 Weight (lbs): 154 Respiratory Rate (breaths/min): 16 Body Mass Index (BMI): 20.3 Blood Pressure (mmHg): 105/79 Reference Range: 80 - 120 mg / dl Electronic Signature(s) Signed: 06/03/2022 3:12:00 PM By: Colin Loud MSN RN CNS WTA Entered By: [name] on 06/03/2022 13:53:26

## 2022-06-04 NOTE — Progress Notes (Addendum)
[name], [name] ([id number]) [id number] Page 1 of 8 Visit Report for 06/03/2022 Chief Complaint Document Details Patient Name: Date of Service: [name], [name] 06/03/2022 1:15 PM Medical Record Number: [id number] Patient Account Number: [id number] Date of Birth/Sex: Treating RN: Colin 30, 1967 (57 y.o. [name] Primary Care Provider: Jani [name] Other Clinician: Referring Provider: Treating Provider/Extender: [name] in Treatment: 33 Information Obtained from: Patient Chief Complaint Sacral and back pressure ulcers Electronic Signature(s) Signed: 06/03/2022 1:54:51 PM By: Colin Keeler PA-C Entered By: [name] on 06/03/2022 13:54:51 -------------------------------------------------------------------------------- HPI Details Patient Name: Date of Service: [name], [name]. 06/03/2022 1:15 PM Medical Record Number: [id number] Patient Account Number: [id number] Date of Birth/Sex: Treating RN: 1966/04/05 (57 y.o. [name] Primary Care Provider: Jani [name] Other Clinician: Referring Provider: Treating Provider/Extender: [name] in Treatment: 33 History of Present Illness ssociated Signs and Symptoms: Patient has a history of confirmed osteomyelitis of the left foot according to MRI July 2019. 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 HPI Description: 12/08/17 on evaluation today patient presents for initial evaluation and our clinic concerning issues he has been having with his feet bilaterally although the left more than right most recently. He was admitted to hospital where he was placed on IV vancomycin which actually he continue to utilize until discharge and even when discharged continue to be on until around 16 August according to what he tells me. His PICC line was removed at that point. Nonetheless he has  been seen in the wound care center in [facility] before transferring to [place] due to the fact that the doctor there left and they are no longer open. Nonetheless he does have significant osteomyelitis of the left foot noted on MRI which revealed significant issues with necrotic bone including a portion of the distal region of his left great toe which was felt to possibly either be missing as a result of amputation or potentially resorption. Either way he also had osteomyelitis noted of the digit, metatarsal region, cuneiform, and navicular bones. There is definite bone exposure noted externally at this point in time. In regard to the right foot he has issues with ulcerations here as well although he states that recently he's had no issues until this just blistered and reopened. Apparently Xeroform was used in this has caused things to be somewhat more moist and open more according to the patient. He's otherwise been tolerating the dressing changes without complication using silver alginate dressings. He has no discomfort but does seem to be extremely stressed regarding the fact that he did see Dr. 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 The patient stated per notes reviewed that he did not want to have any surgery and therefore has a repeat appointment scheduled with the surgeon on 12/15/17. With all that being said he feels like that he really has not been alerted to what was going on in the severity that was until just recently and has a lot of questions in that regard. I'll be see I explained that seeing him for the first time today I cannot answer a lot of those questions that he has. [name] ([id number]) [id number] Page 2 of 8 12/21/17 on evaluation today patient actually appears to be doing a little better in regard to the right foot in particular. There is one area where there  still definitive bone noted although the  MRI which I did review today that we had ordered was negative for any evidence of osteomyelitis which is good news. Nonetheless there was some necrotic bone noted. Overall the patient appears to be doing fairly well however in regard to the right foot. Unfortunately he does have a new open area on the posterior aspect of his left leg as well as the continued area of necrosis noted in the central portion of the foot. He states that he's actually going to be sent for reevaluation specifically to a limb salvage clinic at [facility] he believes this is something that is primary care provider has been working with. They were just awaiting the results of the MRI. 01/08/18 on evaluation today patient actually appears to be doing maybe just a little better in regard to the overall appearance of his bilateral foot ulcers. In general this does not seem to have worsened at least which is good news. He still has evidence of bone noted on the surface of both wound areas which though dry appearing really has not otherwise changed at this time. He actually has an appointment at [facility] with the wound center to discuss limb salvage. He discusses with me at the last visit. Nonetheless he was somewhat upset today about a couple things one being that he stated that we did not put the order in for the protective dressing for his knees that we discussed last time. With that being said I had put in the order for the protective dressing in fact it was on his order sheet that was signed on 12/21/17. Nonetheless unfortunately it appears this was overlooked by home health and they told him if they had the order they could put the protective dressing on. Nonetheless they told him they did not have the order. Again I believe this was an oversight nothing intentional on anyone's part nonetheless the order was there. Still I had explained to the patient sometimes insurance will not cover for the protective dressing even if it  is ordered and this was discussed in the last visit. 01/25/18 on evaluation today patient presents for follow-up concerning his bilateral lower extremity ulcers. He has actually been doing about the same since I last saw him. We do have them in the room we can look at his gluteal region today as well to ensure that there is nothing open or if so address the issue there. Nonetheless he was supposed to see you and see wound care/limb salvage prior to his visit with me today. Unfortunately when he arrived last time at their clinic he apparently was there on the wrong day the doctor was not even present that he was his to be seen. Nonetheless I had to be switched to a different day and the patient actually is going in the next week to see them. Assuming everything works out this should actually be tomorrow. Nonetheless if they take over care of that point then we will subsequently release care to them. Readmission: 10-15-2021 upon evaluation today patient appears to be doing somewhat poorly in regard to wounds over the left gluteal region. He also has areas in the midline sacrum although this is not quite as bad. There is however some evidence of necrotic bone noted at this point unfortunately. The question is whether this is just something working its way out or if it something that is actually actively infected at this point. We are going to have to evaluate that further  little by little as we go with additional diagnostics. With that being said the patient tells me currently that what we are seeing him for has been open since around 09-05-2021 for the gluteal area on the left and in regard to the sacrum 10-01-2021 he states. With that being said he again is not too significant as far as the overall appearance of the wounds are concerned but again with the bone exposed this is something that we may want to do at pathology and culture in regard to. He has previously been treated for osteomyelitis of the  sacral area. 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 This is a gentleman whom I have previously seen back in 2019. The wound that we were taking care of back at that time is completely cleared. 7/25; 2-week follow-up. The patient has 3 small open areas in the lower sacrum and then an area over the left ischial tuberosity. Both of these are stage III wounds. He has been using Hydrofera Blue. 11-12-2021 upon evaluation today patient appears to be doing well currently in regard to his wounds. The distal sacral area is almost completely closed and looks to be doing great. In regard to the left gluteal region this is showing signs of improvement from a depth perspective size is about the same. Overall I am extremely pleased with where things stand compared to what we were previous. 12-03-2021 upon evaluation today patient appears to be doing well currently in regard to his wound. He is actually showing signs of improvement the sacral region and the right ischial/gluteal region is completely healed. The left ischial/gluteal region is still open but seems to be doing much better. 12-24-2021 upon evaluation today patient appears to be doing well currently in regard to some of his wounds others have reopened or are not doing quite as well. Overall though I think that he is on a good track he just may be needs to be a little bit better on appropriate offloading we discussed that today as well. 01-27-2022 upon evaluation today patient appears to be doing well currently in regard to his wound. He is not showing any signs of significant tissue breakdown but unfortunately the wound is larger than what it was last time I saw him. He tells me that this seems to be a complete shock based on what he is hearing today. He tells me that his son was telling him this has been doing well and that it was smaller and not really bleeding or draining much. Again this is  definitely significantly larger than last time I saw him but does not appear to be obviously infected there is some undermining noted as well. In general I am unsure of what would have caused such a dramatic changes what he seems to feel has happened here but nonetheless indeed I do not feel like that this is doing nearly as well as last time I saw him. 11/6; his wound measured better today per our intake nurse using silver alginate and border foam although according to the patient were having trouble with supplies especially the border foam. He has home health. 02-24-2022 upon evaluation today patient appears to be doing better in regard to his wound. The sacral area is looking like it is trying to break down a little bit is not actually open right now but we definitely need to be careful in this regard. I discussed that with him today. With that being said  the patient tells me that he definitely is not changing positions as frequently as he should and I think that is the primary cause of what we are seeing here. Fortunately there does not appear to be any signs of infection locally or systemically at this time. No fevers, chills, nausea, vomiting, or diarrhea. 03-11-2022 upon evaluation today patient is actually making excellent improvement in general. I am extremely pleased with where things stand I do believe that he is headed on the right direction here. 03-25-2022 upon evaluation today patient appears to be doing well currently in regard to his wounds for the most part although the sacral area appears to be a little bit more broken down than what we would have expected. Fortunately there does not appear to be any signs of infection he tells me that he is not exactly sure what happened that caused this to breakdown to the degree that it is. Fortunately there does not appear to be any signs of active infection locally nor systemically at this time which is great news. No fevers, chills, nausea,  vomiting, or diarrhea. 04-22-2022 upon evaluation today patient's wound actually appears to be doing well on the left and the gluteal fold location. With that being said in regard to the sacral area this is open and a little bit more slough covered at this point. Fortunately I do not see any signs of infection locally nor systemically which is great news and overall I am extremely pleased with where things stand today. Overall I am very happy with where we are and I do think that he can get the sacral area he will get again but we need to keep a close eye on everything for sure. He does tell me he is good to be more aggressive with appropriate offloading which I think is definitely a good way to go. 05-06-2022 upon evaluation today patient appears to be doing well currently in regard to his wound on the sacral area though it is a little bit hyper granulated at this point unfortunately. Otherwise this seems to be doing overall decently well. In general I think that we are headed in the right direction. In regards to the wound in the left gluteal region this does have a little bit of depth to it but still seems to be showing signs of improvement which is great news and I am pleased in that regard. 05-20-2022 upon evaluation today patient appears to be doing better in regard to his wounds although he tells me he has been having to make to as he did not have the supplies ordered that he needed for dressing changes. Fortunately there does not appear to be any signs of active infection locally nor systemically which is great news. No fevers, chills, nausea, vomiting, or diarrhea. 06-03-2022 upon evaluation today patient actually appears to be doing excellent in fact his sacral wound is completely healed after the silver nitrate 2 weeks ago. Fortunately I do not see any signs of active infection locally nor systemically at this time. Electronic Signature(s) Signed: 06/03/2022 2:05:16 PM By: Grier Rocher, Koston [name] ([id number]) Irean Hong [phone].pdf Page 3 of 8 Signed: 06/03/2022 2:05:16 PM By: Jenny Reichmann By: [name] on 06/03/2022 14:05:15 -------------------------------------------------------------------------------- Physical Exam Details Patient Name: Date of Service: [name], [name] 06/03/2022 1:15 PM Medical Record Number: [id number] Patient Account Number: [id number] Date of Birth/Sex: Treating RN: 10/13/1965 (57 y.o. [name] Primary Care Provider: Jani [name] Other Clinician:  Referring Provider: Treating Provider/Extender: [name] in Treatment: 33 Constitutional Well-nourished and well-hydrated in no acute distress. Respiratory normal breathing without difficulty. Psychiatric this patient is able to make decisions and demonstrates good insight into disease process. Alert and Oriented x 3. pleasant and cooperative. Notes Upon inspection patient's wound bed actually showed signs again of excellent healing he is making good progress here and I am very pleased with where things have progressed up to this point. I do not see any other evidence of infection at this point which is great news and in general I do believe that he is making good progress here. He has 1 remaining spot on the left trochanter location and this does not even go deep at all. Electronic Signature(s) Signed: 06/03/2022 2:05:53 PM By: Colin Keeler PA-C Entered By: [name] on 06/03/2022 14:05:52 -------------------------------------------------------------------------------- Physician Orders Details Patient Name: Date of Service: [name], [name]. 06/03/2022 1:15 PM Medical Record Number: [id number] Patient Account Number: [id number] Date of Birth/Sex: Treating RN: [birth year] (57 y.o. [name] Primary Care Provider: Jani [name] Other Clinician: Referring Provider: Treating Provider/Extender: [name] in Treatment: 31 Verbal / Phone Orders: No Diagnosis Coding ICD-10 Coding Code Description L89.153 Pressure ulcer of sacral region, stage 3 L89.893 Pressure ulcer of other site, stage 3 G82.54 Quadriplegia, C5-C7 incomplete Schake, [name] ([id number]) [phone].pdf Page 4 of 8 Z93.1 Gastrostomy status Follow-up Appointments Return Appointment in 3 weeks. Nurse Visit as needed [place]: - [place] for wound care. May utilize formulary equivalent dressing for wound treatment orders unless otherwise specified. Home Health Nurse may visit PRN to address patients wound care needs. Scheduled days for dressing changes to be completed; exception, patient has scheduled wound care visit that day. **Please direct any NON-WOUND related issues/requests for orders to patient's Primary Care Physician. **If current dressing causes regression in wound condition, may D/C ordered dressing product/s and apply Normal Saline Moist Dressing daily until next [place] or Other MD appointment. **Notify Wound Healing Center of regression in wound condition at [phone]. Bathing/ [employer] wounds with antibacterial soap and water. May shower; gently cleanse wound with antibacterial soap, rinse and pat dry prior to dressing wounds No tub bath. Off-Loading Turn and reposition every 2 hours Other: - relive pressure from heels on bed Wound Treatment Wound #8 - Gluteus Wound Laterality: Left Cleanser: Soap and Water (Generic) 3 x Per Week/30 Days Discharge Instructions: Gently cleanse wound with antibacterial soap, rinse and pat dry prior to dressing wounds Peri-Wound Care: Skin Prep ([place]) 3 x Per Week/30 Days Discharge Instructions: Use skin prep as directed Prim Dressing: Prisma 4.34 (in) (Home Health) (Dispense As Written) 3 x Per Week/30 Days ary Discharge Instructions:  Moisten w/normal saline or sterile water; Cover wound as directed. Do not remove from wound bed. Secondary Dressing: (BORDER) Zetuvit Plus SILICONE BORDER Dressing 5x5 (in/in) (Home Health) (Dispense As Written) 3 x Per Week/30 Days Discharge Instructions: Please do not put silicone bordered dressings under wraps. Use non-bordered dressing only. Electronic Signature(s) Signed: 06/03/2022 3:12:00 PM By: Rosalio Loud MSN RN CNS WTA Signed: 06/05/2022 5:15:32 PM By: Colin Keeler PA-C Entered By: Rosalio Loud on 06/03/2022 14:20:50 -------------------------------------------------------------------------------- Problem List Details Patient Name: Date of Service: [name], Sunol [name]. 06/03/2022 1:15 PM Medical Record Number: [id number] Patient Account Number: [id number] Date of Birth/Sex: Treating RN: 1965/09/19 (57 y.o. [name] Primary Care Provider:  Jani [name] Other Clinician: Referring Provider: Treating Provider/Extender: [name] in Treatment: 33 Active Problems ICD-10 Encounter Code Description Active Date MDM Diagnosis L89.153 Pressure ulcer of sacral region, stage 3 10/15/2021 No Yes Dieckman, [name] ([id number]) [phone].pdf Page 5 of 8 [phone] Pressure ulcer of other site, stage 3 10/15/2021 No Yes G82.54 Quadriplegia, C5-C7 incomplete 10/15/2021 No Yes Z93.1 Gastrostomy status 10/15/2021 No Yes Inactive Problems Resolved Problems Electronic Signature(s) Signed: 06/03/2022 1:54:45 PM By: Colin Keeler PA-C Entered By: [name] on 06/03/2022 13:54:45 -------------------------------------------------------------------------------- Progress Note Details Patient Name: Date of Service: [name], [place] [name]. 06/03/2022 1:15 PM Medical Record Number: [id number] Patient Account Number: [id number] Date of Birth/Sex: Treating RN: June 30, 1965 (57 y.o. [name] Primary Care Provider: Jani [name] Other  Clinician: Referring Provider: Treating Provider/Extender: [name] in Treatment: 33 Subjective Chief Complaint Information obtained from Patient Sacral and back pressure ulcers History of Present Illness (HPI) The following HPI elements were documented for the patient's wound: Associated Signs and Symptoms: Patient has a history of confirmed osteomyelitis of the left foot according to MRI July 2019. 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 12/08/17 on evaluation today patient presents for initial evaluation and our clinic concerning issues he has been having with his feet bilaterally although the left more than right most recently. He was admitted to hospital where he was placed on IV vancomycin which actually he continue to utilize until discharge and even when discharged continue to be on until around 16 August according to what he tells me. His PICC line was removed at that point. Nonetheless he has been seen in the wound care center in [facility] before transferring to [place] due to the fact that the doctor there left and they are no longer open. Nonetheless he does have significant osteomyelitis of the left foot noted on MRI which revealed significant issues with necrotic bone including a portion of the distal region of his left great toe which was felt to possibly either be missing as a result of amputation or potentially resorption. Either way he also had osteomyelitis noted of the digit, metatarsal region, cuneiform, and navicular bones. There is definite bone exposure noted externally at this point in time. In regard to the right foot he has issues with ulcerations here as well although he states that recently he's had no issues until this just blistered and reopened. Apparently Xeroform was used in this has caused things to be somewhat more moist and open more according to the patient. He's  otherwise been tolerating the dressing changes without complication using silver alginate dressings. He has no discomfort but does seem to be extremely stressed regarding the fact that he did see Dr. 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 The patient stated per notes reviewed that he did not want to have any surgery and therefore has a repeat appointment scheduled with the surgeon on 12/15/17. With all that being said he feels like that he really has not been alerted to what was going on in the severity that was until just recently and has a lot of questions in that regard. I'll be see I explained that seeing him for the first time today I cannot answer a lot of those questions that he has. 12/21/17 on evaluation today patient actually appears to be doing a little better in regard to the right foot  in particular. There is one area where there still definitive bone noted although the MRI which I did review today that we had ordered was negative for any evidence of osteomyelitis which is good news. Nonetheless there was some necrotic bone noted. Overall the patient appears to be doing fairly well however in regard to the right foot. Unfortunately he does have a new open area on the posterior aspect of his left leg as well as the continued area of necrosis noted in the central portion of the foot. He states that he's actually going to be sent for reevaluation specifically to a limb salvage clinic at [facility] he believes this is something that is primary care provider has been working with. They were just awaiting the results of the MRI. 01/08/18 on evaluation today patient actually appears to be doing maybe just a little better in regard to the overall appearance of his bilateral foot ulcers. In general this does not seem to have worsened at least which is good news. He still has evidence of bone noted on the surface of both wound areas which though dry appearing  really has not otherwise changed at this time. He actually has an appointment at [facility] with the wound center to discuss limb salvage. He [name], [name] ([id number]) [id number] Page 6 of 8 discusses with me at the last visit. Nonetheless he was somewhat upset today about a couple things one being that he stated that we did not put the order in for the protective dressing for his knees that we discussed last time. With that being said I had put in the order for the protective dressing in fact it was on his order sheet that was signed on 12/21/17. Nonetheless unfortunately it appears this was overlooked by home health and they told him if they had the order they could put the protective dressing on. Nonetheless they told him they did not have the order. Again I believe this was an oversight nothing intentional on anyone's part nonetheless the order was there. Still I had explained to the patient sometimes insurance will not cover for the protective dressing even if it is ordered and this was discussed in the last visit. 01/25/18 on evaluation today patient presents for follow-up concerning his bilateral lower extremity ulcers. He has actually been doing about the same since I last saw him. We do have them in the room we can look at his gluteal region today as well to ensure that there is nothing open or if so address the issue there. Nonetheless he was supposed to see you and see wound care/limb salvage prior to his visit with me today. Unfortunately when he arrived last time at their clinic he apparently was there on the wrong day the doctor was not even present that he was his to be seen. Nonetheless I had to be switched to a different day and the patient actually is going in the next week to see them. Assuming everything works out this should actually be tomorrow. Nonetheless if they take over care of that point then we will subsequently release care to  them. Readmission: 10-15-2021 upon evaluation today patient appears to be doing somewhat poorly in regard to wounds over the left gluteal region. He also has areas in the midline sacrum although this is not quite as bad. There is however some evidence of necrotic bone noted at this point unfortunately. The question is whether this is just something working its way out or if it something  that is actually actively infected at this point. We are going to have to evaluate that further little by little as we go with additional diagnostics. With that being said the patient tells me currently that what we are seeing him for has been open since around 09-05-2021 for the gluteal area on the left and in regard to the sacrum 10-01-2021 he states. With that being said he again is not too significant as far as the overall appearance of the wounds are concerned but again with the bone exposed this is something that we may want to do at pathology and culture in regard to. 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 This is a gentleman whom I have previously seen back in 2019. The wound that we were taking care of back at that time is completely cleared. 7/25; 2-week follow-up. The patient has 3 small open areas in the lower sacrum and then an area over the left ischial tuberosity. Both of these are stage III wounds. He has been using Hydrofera Blue. 11-12-2021 upon evaluation today patient appears to be doing well currently in regard to his wounds. The distal sacral area is almost completely closed and looks to be doing great. In regard to the left gluteal region this is showing signs of improvement from a depth perspective size is about the same. Overall I am extremely pleased with where things stand compared to what we were previous. 12-03-2021 upon evaluation today patient appears to be doing well  currently in regard to his wound. He is actually showing signs of improvement the sacral region and the right ischial/gluteal region is completely healed. The left ischial/gluteal region is still open but seems to be doing much better. 12-24-2021 upon evaluation today patient appears to be doing well currently in regard to some of his wounds others have reopened or are not doing quite as well. Overall though I think that he is on a good track he just may be needs to be a little bit better on appropriate offloading we discussed that today as well. 01-27-2022 upon evaluation today patient appears to be doing well currently in regard to his wound. He is not showing any signs of significant tissue breakdown but unfortunately the wound is larger than what it was last time I saw him. He tells me that this seems to be a complete shock based on what he is hearing today. He tells me that his son was telling him this has been doing well and that it was smaller and not really bleeding or draining much. Again this is definitely significantly larger than last time I saw him but does not appear to be obviously infected there is some undermining noted as well. In general I am unsure of what would have caused such a dramatic changes what he seems to feel has happened here but nonetheless indeed I do not feel like that this is doing nearly as well as last time I saw him. 11/6; his wound measured better today per our intake nurse using silver alginate and border foam although according to the patient were having trouble with supplies especially the border foam. He has home health. 02-24-2022 upon evaluation today patient appears to be doing better in regard to his wound. The sacral area is looking like it is trying to break down a little bit is not actually open right now but we definitely need  to be careful in this regard. I discussed that with him today. With that being said the patient tells me that he definitely is  not changing positions as frequently as he should and I think that is the primary cause of what we are seeing here. Fortunately there does not appear to be any signs of infection locally or systemically at this time. No fevers, chills, nausea, vomiting, or diarrhea. 03-11-2022 upon evaluation today patient is actually making excellent improvement in general. I am extremely pleased with where things stand I do believe that he is headed on the right direction here. 03-25-2022 upon evaluation today patient appears to be doing well currently in regard to his wounds for the most part although the sacral area appears to be a little bit more broken down than what we would have expected. Fortunately there does not appear to be any signs of infection he tells me that he is not exactly sure what happened that caused this to breakdown to the degree that it is. Fortunately there does not appear to be any signs of active infection locally nor systemically at this time which is great news. No fevers, chills, nausea, vomiting, or diarrhea. 04-22-2022 upon evaluation today patient's wound actually appears to be doing well on the left and the gluteal fold location. With that being said in regard to the sacral area this is open and a little bit more slough covered at this point. Fortunately I do not see any signs of infection locally nor systemically which is great news and overall I am extremely pleased with where things stand today. Overall I am very happy with where we are and I do think that he can get the sacral area he will get again but we need to keep a close eye on everything for sure. He does tell me he is good to be more aggressive with appropriate offloading which I think is definitely a good way to go. 05-06-2022 upon evaluation today patient appears to be doing well currently in regard to his wound on the sacral area though it is a little bit hyper granulated at this point unfortunately. Otherwise this seems  to be doing overall decently well. In general I think that we are headed in the right direction. In regards to the wound in the left gluteal region this does have a little bit of depth to it but still seems to be showing signs of improvement which is great news and I am pleased in that regard. 05-20-2022 upon evaluation today patient appears to be doing better in regard to his wounds although he tells me he has been having to make to as he did not have the supplies ordered that he needed for dressing changes. Fortunately there does not appear to be any signs of active infection locally nor systemically which is great news. No fevers, chills, nausea, vomiting, or diarrhea. 06-03-2022 upon evaluation today patient actually appears to be doing excellent in fact his sacral wound is completely healed after the silver nitrate 2 weeks ago. Fortunately I do not see any signs of active infection locally nor systemically at this time. Objective Constitutional Well-nourished and well-hydrated in no acute distress. [name], [name] ([id number]) [id number] Page 7 of 8 Vitals Time Taken: 1:40 PM, Height: 73 in, Weight: 154 lbs, BMI: 20.3, Temperature: 98.1 F, Pulse: 79 bpm, Respiratory Rate: 16 breaths/min, Blood Pressure: 105/79 mmHg. Respiratory normal breathing without difficulty. Psychiatric this patient is able to make decisions and demonstrates good insight into disease  process. Alert and Oriented x 3. pleasant and cooperative. General Notes: Upon inspection patient's wound bed actually showed signs again of excellent healing he is making good progress here and I am very pleased with where things have progressed up to this point. I do not see any other evidence of infection at this point which is great news and in general I do believe that he is making good progress here. He has 1 remaining spot on the left trochanter location and this does not even go deep at  all. Integumentary (Hair, Skin) Wound #10 status is Healed - Epithelialized. Original cause of wound was Pressure Injury. The date acquired was: 04/18/2022. The wound has been in treatment 6 weeks. The wound is located on the Midline Sacrum. The wound measures 0cm length x 0cm width x 0cm depth; 0cm^2 area and 0cm^3 volume. There is Fat Layer (Subcutaneous Tissue) exposed. There is a large amount of serous drainage noted. The wound margin is flat and intact. There is medium (34-66%) red, hyper - granulation within the wound bed. There is a medium (34-66%) amount of necrotic tissue within the wound bed. Wound #8 status is Open. Original cause of wound was Pressure Injury. The date acquired was: 09/05/2021. The wound has been in treatment 33 weeks. The wound is located on the Left Gluteus. The wound measures 0.1cm length x 0.1cm width x 1cm depth; 0.008cm^2 area and 0.008cm^3 volume. There is Fat Layer (Subcutaneous Tissue) exposed. There is a medium amount of serosanguineous drainage noted. There is large (67-100%) red granulation within the wound bed. There is a small (1-33%) amount of necrotic tissue within the wound bed. Assessment Active Problems ICD-10 Pressure ulcer of sacral region, stage 3 Pressure ulcer of other site, stage 3 Quadriplegia, C5-C7 incomplete Gastrostomy status Plan 1. Recommend currently that we have the patient continue with the appropriate offloading I am hopeful he will continue to show signs of improvement week by week. With that being said I definitely think that he needs to keep with the appropriate offloading this is going to be of utmost importance that he is going to see this continue to heal. 2. I am also can recommend that we have the patient continue to use the silver collagen into the left hip/trochanter location this should hopefully help to get this closed. 3. I am also can recommend that he continue with regular follow-ups although we are going to see him in  3 weeks this was patient's request. We will see patient back for reevaluation in 3 weeks here in the clinic. If anything worsens or changes patient will contact our office for additional recommendations. He knows to contact us sooner if he has any issues. Electronic Signature(s) Signed: 06/03/2022 2:06:38 PM By: Colin Keeler PA-C Entered By: [name] on 06/03/2022 14:06:38 -------------------------------------------------------------------------------- SuperBill Details Patient Name: Date of Service: [name], [place] 06/03/2022 Medical Record Number: [id number] Patient Account Number: [id number] Date of Birth/Sex: Treating RN: [birth year] (57 y.o. [name] Primary Care Provider: Jani [name] Other Clinician: Referring Provider: Treating Provider/Extender: Bobbie Stack [employer], [name] ([id number]) [phone].pdf Page 8 of 8 Weeks in Treatment: 33 Diagnosis Coding ICD-10 Codes Code Description L89.153 Pressure ulcer of sacral region, stage 3 L89.893 Pressure ulcer of other site, stage 3 G82.54 Quadriplegia, C5-C7 incomplete Z93.1 Gastrostomy status Facility Procedures : CPT4 Code: [id number] Description: [id number] - WOUND CARE VISIT-LEV 3 EST PT Modifier: Quantity: 1 Physician Procedures : CPT4 Code Description Modifier [id number] - WC  PHYS LEVEL 3 - EST PT ICD-10 Diagnosis Description L89.153 Pressure ulcer of sacral region, stage 3 L89.893 Pressure ulcer of other site, stage 3 G82.54 Quadriplegia, C5-C7 incomplete Z93.1 Gastrostomy  status Quantity: 1 Electronic Signature(s) Signed: 06/06/2022 8:55:32 AM By: Gretta Cool, BSN, RN, CWS, Kim RN, BSN Signed: 06/06/2022 1:46:09 PM By: Colin Keeler PA-C Previous Signature: 06/03/2022 3:12:00 PM Version By: Rosalio Loud MSN RN CNS WTA Previous Signature: 06/05/2022 5:15:32 PM Version By: Colin Keeler PA-C Previous Signature: 06/03/2022 2:06:59 PM Version By: Colin Keeler  PA-C Entered By: Gretta Cool, BSN, RN, CWS, Kim on 06/06/2022 08:55:32

## 2022-06-24 ENCOUNTER — Encounter: Payer: Medicare Other | Attending: Physician Assistant | Admitting: Physician Assistant

## 2022-06-24 DIAGNOSIS — G8254 Quadriplegia, C5-C7 incomplete: Secondary | ICD-10-CM | POA: Insufficient documentation

## 2022-06-24 DIAGNOSIS — L89153 Pressure ulcer of sacral region, stage 3: Secondary | ICD-10-CM | POA: Diagnosis not present

## 2022-06-24 DIAGNOSIS — Z931 Gastrostomy status: Secondary | ICD-10-CM | POA: Insufficient documentation

## 2022-06-24 DIAGNOSIS — L89893 Pressure ulcer of other site, stage 3: Secondary | ICD-10-CM | POA: Insufficient documentation

## 2022-06-25 NOTE — Progress Notes (Addendum)
JYREE, DEE [name] ([id number]) [id number] Page 1 of 8 Visit Report for 06/24/2022 Chief Complaint Document Details Patient Name: Date of Service: [name], [name] 06/24/2022 1:15 PM Medical Record Number: [id number] Patient Account Number: [id number] Date of Birth/Sex: Treating RN: 24-Apr-1965 (57 y.o. [name] Primary Care Provider: [name] Other Clinician: Referring Provider: Treating Provider/Extender: [name] in Treatment: 36 Information Obtained from: Patient Chief Complaint Sacral and back pressure ulcers Electronic Signature(s) Signed: 06/24/2022 1:27:42 PM By: Worthy Keeler PA-C Entered By: Worthy Keeler on 06/24/2022 13:27:42 -------------------------------------------------------------------------------- HPI Details Patient Name: Date of Service: [name], [name]. 06/24/2022 1:15 PM Medical Record Number: [id number] Patient Account Number: [id number] Date of Birth/Sex: Treating RN: 1965-09-21 (57 y.o. 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 He also has [name] due A to a cervical fracture, he is underweight, and has protein calorie malnutrition. HPI Description: 12/08/17 on evaluation today patient presents for initial evaluation and our clinic concerning issues he has been having with his feet bilaterally although the left more than right most recently. He was admitted to hospital where he was placed on IV vancomycin which actually he continue to utilize until discharge and even when discharged continue to be on until around 16 August according to what he tells me. His PICC line was removed at that point. Nonetheless he has  been seen in the wound care center in [place] before transferring to [place] due to the fact that the doctor there left and they are no longer open. Nonetheless he does have significant osteomyelitis of the left foot noted on MRI which revealed significant issues with necrotic bone including a portion of the distal region of his left great toe which was felt to possibly either be missing as a result of amputation or potentially resorption. Either way he also had osteomyelitis noted of the digit, metatarsal region, cuneiform, and navicular bones. There is definite bone exposure noted externally at this point in time. In regard to the right foot he has issues with ulcerations here as well although he states that recently he's had no issues until this just blistered and reopened. Apparently Xeroform was used in this has caused things to be somewhat more moist and open more according to the patient. He's otherwise been tolerating the dressing changes without complication using silver alginate dressings. He has no discomfort but does seem to be extremely stressed regarding the fact that he did see Dr. 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 The patient stated per notes reviewed that he did not want to have any surgery and therefore has a repeat appointment scheduled with the surgeon on 12/15/17. With all that being said he feels like that he really has not been alerted to what was going on in the severity that was until just recently and has a lot of questions in that regard. I'll be see I explained that seeing him for the first time today I cannot answer a lot of those questions that he has. [name], [name] ([id number]) [id number] Page 2 of 8 12/21/17 on evaluation today patient actually appears to be doing a little better in regard to the right foot in particular. There is one area where there  still definitive bone noted although the  MRI which I did review today that we had ordered was negative for any evidence of osteomyelitis which is good news. Nonetheless there was some necrotic bone noted. Overall the patient appears to be doing fairly well however in regard to the right foot. Unfortunately he does have a new open area on the posterior aspect of his left leg as well as the continued area of necrosis noted in the central portion of the foot. He states that he's actually going to be sent for reevaluation specifically to a limb salvage clinic at [facility] he believes this is something that is primary care provider has been working with. They were just awaiting the results of the MRI. 01/08/18 on evaluation today patient actually appears to be doing maybe just a little better in regard to the overall appearance of his bilateral foot ulcers. In general this does not seem to have worsened at least which is good news. He still has evidence of bone noted on the surface of both wound areas which though dry appearing really has not otherwise changed at this time. He actually has an appointment at [facility] with the wound center to discuss limb salvage. He discusses with me at the last visit. Nonetheless he was somewhat upset today about a couple things one being that he stated that we did not put the order in for the protective dressing for his knees that we discussed last time. With that being said I had put in the order for the protective dressing in fact it was on his order sheet that was signed on 12/21/17. Nonetheless unfortunately it appears this was overlooked by home health and they told him if they had the order they could put the protective dressing on. Nonetheless they told him they did not have the order. Again I believe this was an oversight nothing intentional on anyone's part nonetheless the order was there. Still I had explained to the patient sometimes insurance will not cover for the protective dressing even if it  is ordered and this was discussed in the last visit. 01/25/18 on evaluation today patient presents for follow-up concerning his bilateral lower extremity ulcers. He has actually been doing about the same since I last saw him. We do have them in the room we can look at his gluteal region today as well to ensure that there is nothing open or if so address the issue there. Nonetheless he was supposed to see you and see wound care/limb salvage prior to his visit with me today. Unfortunately when he arrived last time at their clinic he apparently was there on the wrong day the doctor was not even present that he was his to be seen. Nonetheless I had to be switched to a different day and the patient actually is going in the next week to see them. Assuming everything works out this should actually be tomorrow. Nonetheless if they take over care of that point then we will subsequently release care to them. Readmission: 10-15-2021 upon evaluation today patient appears to be doing somewhat poorly in regard to wounds over the left gluteal region. He also has areas in the midline sacrum although this is not quite as bad. There is however some evidence of necrotic bone noted at this point unfortunately. The question is whether this is just something working its way out or if it something that is actually actively infected at this point. We are going to have to evaluate that further  little by little as we go with additional diagnostics. With that being said the patient tells me currently that what we are seeing him for has been open since around 09-05-2021 for the gluteal area on the left and in regard to the sacrum 10-01-2021 he states. With that being said he again is not too significant as far as the overall appearance of the wounds are concerned but again with the bone exposed this is something that we may want to do at pathology and culture in regard to. He has previously been treated for osteomyelitis of the  sacral area. Patient does have [name] due to a [name] injury. He is also gastrotomy status. This is a gentleman whom I have previously seen back in 2019. The wound that we were taking care of back at that time is completely cleared. 7/25; 2-week follow-up. The patient has 3 small open areas in the lower sacrum and then an area over the left ischial tuberosity. Both of these are stage III wounds. He has been using Hydrofera Blue. 11-12-2021 upon evaluation today patient appears to be doing well currently in regard to his wounds. The distal sacral area is almost completely closed and looks to be doing great. In regard to the left gluteal region this is showing signs of improvement from a depth perspective size is about the same. Overall I am extremely pleased with where things stand compared to what we were previous. 12-03-2021 upon evaluation today patient appears to be doing well currently in regard to his wound. He is actually showing signs of improvement the sacral region and the right ischial/gluteal region is completely healed. The left ischial/gluteal region is still open but seems to be doing much better. 12-24-2021 upon evaluation today patient appears to be doing well currently in regard to some of his wounds others have reopened or are not doing quite as well. Overall though I think that he is on a good track he just may be needs to be a little bit better on appropriate offloading we discussed that today as well. 01-27-2022 upon evaluation today patient appears to be doing well currently in regard to his wound. He is not showing any signs of significant tissue breakdown but unfortunately the wound is larger than what it was last time I saw him. He tells me that this seems to be a complete shock based on what he is hearing today. He tells me that his son was telling him this has been doing well and that it was smaller and not really bleeding or draining much. Again this is  definitely significantly larger than last time I saw him but does not appear to be obviously infected there is some undermining noted as well. In general I am unsure of what would have caused such a dramatic changes what he seems to feel has happened here but nonetheless indeed I do not feel like that this is doing nearly as well as last time I saw him. 11/6; his wound measured better today per our intake nurse using silver alginate and border foam although according to the patient were having trouble with supplies especially the border foam. He has home health. 02-24-2022 upon evaluation today patient appears to be doing better in regard to his wound. The sacral area is looking like it is trying to break down a little bit is not actually open right now but we definitely need to be careful in this regard. I discussed that with him today. With that being said  the patient tells me that he definitely is not changing positions as frequently as he should and I think that is the primary cause of what we are seeing here. Fortunately there does not appear to be any signs of infection locally or systemically at this time. No fevers, chills, nausea, vomiting, or diarrhea. 03-11-2022 upon evaluation today patient is actually making excellent improvement in general. I am extremely pleased with where things stand I do believe that he is headed on the right direction here. 03-25-2022 upon evaluation today patient appears to be doing well currently in regard to his wounds for the most part although the sacral area appears to be a little bit more broken down than what we would have expected. Fortunately there does not appear to be any signs of infection he tells me that he is not exactly sure what happened that caused this to breakdown to the degree that it is. Fortunately there does not appear to be any signs of active infection locally nor systemically at this time which is great news. No fevers, chills, nausea,  vomiting, or diarrhea. 04-22-2022 upon evaluation today patient's wound actually appears to be doing well on the left and the gluteal fold location. With that being said in regard to the sacral area this is open and a little bit more slough covered at this point. Fortunately I do not see any signs of infection locally nor systemically which is great news and overall I am extremely pleased with where things stand today. Overall I am very happy with where we are and I do think that he can get the sacral area he will get again but we need to keep a close eye on everything for sure. He does tell me he is good to be more aggressive with appropriate offloading which I think is definitely a good way to go. 05-06-2022 upon evaluation today patient appears to be doing well currently in regard to his wound on the sacral area though it is a little bit hyper granulated at this point unfortunately. Otherwise this seems to be doing overall decently well. In general I think that we are headed in the right direction. In regards to the wound in the left gluteal region this does have a little bit of depth to it but still seems to be showing signs of improvement which is great news and I am pleased in that regard. 05-20-2022 upon evaluation today patient appears to be doing better in regard to his wounds although he tells me he has been having to make to as he did not have the supplies ordered that he needed for dressing changes. Fortunately there does not appear to be any signs of active infection locally nor systemically which is great news. No fevers, chills, nausea, vomiting, or diarrhea. 06-03-2022 upon evaluation today patient actually appears to be doing excellent in fact his sacral wound is completely healed after the silver nitrate 2 weeks ago. Fortunately I do not see any signs of active infection locally nor systemically at this time. 06-24-2022 upon evaluation today patient actually appears to be making excellent  progress in regard to his wounds. He just has 1 left on the left hip/trochanter location and this 1 remaining spot is very tiny just a pinpoint. [name], [name] ([id number]) [id number] Page 3 of 8 Electronic Signature(s) Signed: 06/24/2022 5:34:53 PM By: Worthy Keeler PA-C Entered By: Worthy Keeler on 06/24/2022 17:34:53 -------------------------------------------------------------------------------- Physical Exam Details Patient Name: Date of Service: [name], [name].  06/24/2022 1:15 PM Medical Record Number: [id number] Patient Account Number: [id number] Date of Birth/Sex: Treating RN: 08/06/1965 (57 y.o. [name] Primary Care Provider: [name] Other Clinician: Referring Provider: Treating Provider/Extender: [name] in Treatment: 61 Constitutional Well-nourished and well-hydrated in no acute distress. Respiratory normal breathing without difficulty. Psychiatric this patient is able to make decisions and demonstrates good insight into disease process. Alert and Oriented x 3. pleasant and cooperative. Notes Upon inspection patient's wound bed actually showed signs of good granulation and epithelization in general I think that he is making great progress no debridement needed today and the remaining wound is extremely tiny. Electronic Signature(s) Signed: 06/24/2022 5:35:13 PM By: Worthy Keeler PA-C Entered By: Worthy Keeler on 06/24/2022 17:35:13 -------------------------------------------------------------------------------- Physician Orders Details Patient Name: Date of Service: [address], [place] [name]. 06/24/2022 1:15 PM Medical Record Number: [id number] Patient Account Number: [id number] Date of Birth/Sex: Treating RN: 20-Mar-1966 (57 y.o. 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 Nurse Visit as needed [place]: - [place] for wound care. May utilize formulary equivalent dressing for wound treatment orders unless otherwise specified. Home Health Nurse may visit PRN to address patients wound care needs. Scheduled days for dressing changes to be completed; exception, patient has scheduled wound care visit that day. **Please direct any NON-WOUND related issues/requests for orders to patient's Primary Care Physician. **If current dressing causes regression in wound condition, may D/C ordered dressing product/s and apply Normal Saline Moist Dressing daily until next [place] or Other MD appointment. **Notify W[facility] Healing Center of regression in wound condition at [phone]. Bathing/ [employer] wounds with antibacterial soap and water. May shower; gently cleanse wound with antibacterial soap, rinse and pat dry prior to dressing wounds No tub bath. Off-Loading Turn and reposition every 2 hours Other: - relive pressure from heels on bed Wound Treatment Wound #8 - Gluteus Wound Laterality: Left Cleanser: Soap and Water (Generic) 3 x Per Week/30 Days Discharge Instructions: Gently cleanse wound with antibacterial soap, rinse and pat dry prior to dressing wounds Peri-Wound Care: Skin Prep ([place]) 3 x Per Week/30 Days Discharge Instructions: Use skin prep as directed Prim Dressing: Prisma 4.34 (in) (Home Health) (Dispense As Written) 3 x Per Week/30 Days ary Discharge Instructions: Moisten w/normal saline or sterile water;  Cover wound as directed. Do not remove from wound bed. Secondary Dressing: (BORDER) Zetuvit Plus SILICONE BORDER Dressing 5x5 (in/in) (Home Health) (Dispense As Written) 3 x Per Week/30 Days Discharge Instructions: Please do not put silicone bordered dressings under wraps. Use non-bordered dressing only. Electronic Signature(s) Signed: 06/24/2022 6:19:02 PM By: Worthy Keeler PA-C Signed: 06/25/2022 8:15:46 AM By: Rosalio Loud MSN RN CNS WTA Entered By: Rosalio Loud on 06/24/2022 13:49:37 -------------------------------------------------------------------------------- Problem List Details Patient Name: Date of Service: [name], [place] [name]. 06/24/2022 1:15 PM Medical Record Number: [id number] Patient Account Number: [id number] Date of Birth/Sex: Treating RN: 10-28-1965 (57 y.o. [name] Primary Care Provider: [name] Other Clinician: Referring Provider: Treating Provider/Extender: [name] in Treatment: [phone] Active  Problems ICD-10 Encounter Code Description Active Date MDM Diagnosis L89.153 Pressure ulcer of sacral region, stage 3 10/15/2021 No Yes Esbenshade, Goldman [name] ([id number]) [id number] Page 5 of 8 [phone] Pressure ulcer of other site, stage 3 10/15/2021 No Yes [name], [name] 10/15/2021 No Yes Z93.1 Gastrostomy status 10/15/2021 No Yes Inactive Problems Resolved Problems Electronic Signature(s) Signed: 06/24/2022 1:27:37 PM By: Worthy Keeler PA-C Entered By: Worthy Keeler on 06/24/2022 13:27:37 -------------------------------------------------------------------------------- Progress Note Details Patient Name: Date of Service: [name], [place] [name]. 06/24/2022 1:15 PM Medical Record Number: [id number] Patient Account Number: [id number] Date of Birth/Sex: Treating RN: May 17, 1965 (57 y.o. [name] Primary Care Provider: [name] Other Clinician: Referring Provider: Treating  Provider/Extender: [name] in Treatment: 36 Subjective Chief Complaint Information obtained from Patient Sacral and back pressure ulcers History of Present Illness (HPI) The following HPI elements were documented for the patient's wound: Associated Signs and Symptoms: Patient has a history of confirmed osteomyelitis of the left foot according to MRI July 2019. He also has [name] due to a cervical fracture, he is underweight, and has protein calorie malnutrition. 12/08/17 on evaluation today patient presents for initial evaluation and our clinic concerning issues he has been having with his feet bilaterally although the left more than right most recently. He was admitted to hospital where he was placed on IV vancomycin which actually he continue to utilize until discharge and even when discharged continue to be on until around 16 August according to what he tells me. His PICC line was removed at that point. Nonetheless he has been seen in the wound care center in [facility] before transferring to [place] due to the fact that the doctor there left and they are no longer open. Nonetheless he does have significant osteomyelitis of the left foot noted on MRI which revealed significant issues with necrotic bone including a portion of the distal region of his left great toe which was felt to possibly either be missing as a result of amputation or potentially resorption. Either way he also had osteomyelitis noted of the digit, metatarsal region, cuneiform, and navicular bones. There is definite bone exposure noted externally at this point in time. In regard to the right foot he has issues with ulcerations here as well although he states that recently he's had no issues until this just blistered and reopened. Apparently Xeroform was used in this has caused things to be somewhat more moist and open more according to the patient. He's otherwise been tolerating the dressing  changes without complication using silver alginate dressings. He has no discomfort but does seem to be extremely stressed regarding the fact that he did see Dr. 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 The patient stated per notes reviewed that he did not want to have any surgery and therefore has a repeat appointment scheduled with the surgeon on 12/15/17. With all that being said he feels like that he really has not been alerted to what was going on in the severity that was until just recently and has a lot of questions in that regard. I'll be see I explained that seeing him for the first time today I cannot answer a lot of those questions that he has. 12/21/17 on evaluation today patient actually appears to be doing a little better in regard to the right foot in particular. There is one area where there still definitive bone noted although the MRI which I  did review today that we had ordered was negative for any evidence of osteomyelitis which is good news. Nonetheless there was some necrotic bone noted. Overall the patient appears to be doing fairly well however in regard to the right foot. Unfortunately he does have a new open area on the posterior aspect of his left leg as well as the continued area of necrosis noted in the central portion of the foot. He states that he's actually going to be sent for reevaluation specifically to a limb salvage clinic at [facility] he believes this is something that is primary care provider has been working with. They were just awaiting the results of the MRI. 01/08/18 on evaluation today patient actually appears to be doing maybe just a little better in regard to the overall appearance of his bilateral foot ulcers. In [place], Cadott [name] ([id number]) [id number] Page 6 of 8 general this does not seem to have worsened at least which is good news. He still has evidence of bone noted on the surface of  both wound areas which though dry appearing really has not otherwise changed at this time. He actually has an appointment at [facility] with the wound center to discuss limb salvage. He discusses with me at the last visit. Nonetheless he was somewhat upset today about a couple things one being that he stated that we did not put the order in for the protective dressing for his knees that we discussed last time. With that being said I had put in the order for the protective dressing in fact it was on his order sheet that was signed on 12/21/17. Nonetheless unfortunately it appears this was overlooked by home health and they told him if they had the order they could put the protective dressing on. Nonetheless they told him they did not have the order. Again I believe this was an oversight nothing intentional on anyone's part nonetheless the order was there. Still I had explained to the patient sometimes insurance will not cover for the protective dressing even if it is ordered and this was discussed in the last visit. 01/25/18 on evaluation today patient presents for follow-up concerning his bilateral lower extremity ulcers. He has actually been doing about the same since I last saw him. We do have them in the room we can look at his gluteal region today as well to ensure that there is nothing open or if so address the issue there. Nonetheless he was supposed to see you and see wound care/limb salvage prior to his visit with me today. Unfortunately when he arrived last time at their clinic he apparently was there on the wrong day the doctor was not even present that he was his to be seen. Nonetheless I had to be switched to a different day and the patient actually is going in the next week to see them. Assuming everything works out this should actually be tomorrow. Nonetheless if they take over care of that point then we will subsequently release care to them. Readmission: 10-15-2021 upon evaluation today patient  appears to be doing somewhat poorly in regard to wounds over the left gluteal region. He also has areas in the midline sacrum although this is not quite as bad. There is however some evidence of necrotic bone noted at this point unfortunately. The question is whether this is just something working its way out or if it something that is actually actively infected at this point. We are going to have to evaluate that further  little by little as we go with additional diagnostics. With that being said the patient tells me currently that what we are seeing him for has been open since around 09-05-2021 for the gluteal area on the left and in regard to the sacrum 10-01-2021 he states. With that being said he again is not too significant as far as the overall appearance of the wounds are concerned but again with the bone exposed this is something that we may want to do at pathology and culture in regard to. 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 Patient does have [name] due to a [name] injury. He is also gastrotomy status. This is a gentleman whom I have previously seen back in 2019. The wound that we were taking care of back at that time is completely cleared. 7/25; 2-week follow-up. The patient has 3 small open areas in the lower sacrum and then an area over the left ischial tuberosity. Both of these are stage III wounds. He has been using Hydrofera Blue. 11-12-2021 upon evaluation today patient appears to be doing well currently in regard to his wounds. The distal sacral area is almost completely closed and looks to be doing great. In regard to the left gluteal region this is showing signs of improvement from a depth perspective size is about the same. Overall I am extremely pleased with where things stand compared to what we were previous. 12-03-2021 upon evaluation today patient appears to be doing well currently in regard to his wound. He is actually showing signs of  improvement the sacral region and the right ischial/gluteal region is completely healed. The left ischial/gluteal region is still open but seems to be doing much better. 12-24-2021 upon evaluation today patient appears to be doing well currently in regard to some of his wounds others have reopened or are not doing quite as well. Overall though I think that he is on a good track he just may be needs to be a little bit better on appropriate offloading we discussed that today as well. 01-27-2022 upon evaluation today patient appears to be doing well currently in regard to his wound. He is not showing any signs of significant tissue breakdown but unfortunately the wound is larger than what it was last time I saw him. He tells me that this seems to be a complete shock based on what he is hearing today. He tells me that his son was telling him this has been doing well and that it was smaller and not really bleeding or draining much. Again this is definitely significantly larger than last time I saw him but does not appear to be obviously infected there is some undermining noted as well. In general I am unsure of what would have caused such a dramatic changes what he seems to feel has happened here but nonetheless indeed I do not feel like that this is doing nearly as well as last time I saw him. 11/6; his wound measured better today per our intake nurse using silver alginate and border foam although according to the patient were having trouble with supplies especially the border foam. He has home health. 02-24-2022 upon evaluation today patient appears to be doing better in regard to his wound. The sacral area is looking like it is trying to break down a little bit is not actually open right now but we definitely need to be careful in this regard. I discussed that with him today. With that being said the  patient tells me that he definitely is not changing positions as frequently as he should and I think that  is the primary cause of what we are seeing here. Fortunately there does not appear to be any signs of infection locally or systemically at this time. No fevers, chills, nausea, vomiting, or diarrhea. 03-11-2022 upon evaluation today patient is actually making excellent improvement in general. I am extremely pleased with where things stand I do believe that he is headed on the right direction here. 03-25-2022 upon evaluation today patient appears to be doing well currently in regard to his wounds for the most part although the sacral area appears to be a little bit more broken down than what we would have expected. Fortunately there does not appear to be any signs of infection he tells me that he is not exactly sure what happened that caused this to breakdown to the degree that it is. Fortunately there does not appear to be any signs of active infection locally nor systemically at this time which is great news. No fevers, chills, nausea, vomiting, or diarrhea. 04-22-2022 upon evaluation today patient's wound actually appears to be doing well on the left and the gluteal fold location. With that being said in regard to the sacral area this is open and a little bit more slough covered at this point. Fortunately I do not see any signs of infection locally nor systemically which is great news and overall I am extremely pleased with where things stand today. Overall I am very happy with where we are and I do think that he can get the sacral area he will get again but we need to keep a close eye on everything for sure. He does tell me he is good to be more aggressive with appropriate offloading which I think is definitely a good way to go. 05-06-2022 upon evaluation today patient appears to be doing well currently in regard to his wound on the sacral area though it is a little bit hyper granulated at this point unfortunately. Otherwise this seems to be doing overall decently well. In general I think that we are  headed in the right direction. In regards to the wound in the left gluteal region this does have a little bit of depth to it but still seems to be showing signs of improvement which is great news and I am pleased in that regard. 05-20-2022 upon evaluation today patient appears to be doing better in regard to his wounds although he tells me he has been having to make to as he did not have the supplies ordered that he needed for dressing changes. Fortunately there does not appear to be any signs of active infection locally nor systemically which is great news. No fevers, chills, nausea, vomiting, or diarrhea. 06-03-2022 upon evaluation today patient actually appears to be doing excellent in fact his sacral wound is completely healed after the silver nitrate 2 weeks ago. Fortunately I do not see any signs of active infection locally nor systemically at this time. 06-24-2022 upon evaluation today patient actually appears to be making excellent progress in regard to his wounds. He just has 1 left on the left hip/trochanter location and this 1 remaining spot is very tiny just a pinpoint. ADGER, CANTERA [name] ([id number]) [id number] Page 7 of 8 Objective Constitutional Well-nourished and well-hydrated in no acute distress. Vitals Time Taken: 1:33 PM, Height: 73 in, Weight: 154 lbs, BMI: 20.3, Temperature: 97.6 F, Pulse: 78 bpm, Respiratory Rate: 18 breaths/min, Blood Pressure:  96/66 mmHg. Respiratory normal breathing without difficulty. Psychiatric this patient is able to make decisions and demonstrates good insight into disease process. Alert and Oriented x 3. pleasant and cooperative. General Notes: Upon inspection patient's wound bed actually showed signs of good granulation and epithelization in general I think that he is making great progress no debridement needed today and the remaining wound is extremely tiny. Integumentary (Hair, Skin) Wound #8 status is Open.  Original cause of wound was Pressure Injury. The date acquired was: 09/05/2021. The wound has been in treatment 36 weeks. The wound is located on the Left Gluteus. The wound measures 0.1cm length x 0.1cm width x 1cm depth; 0.008cm^2 area and 0.008cm^3 volume. There is Fat Layer (Subcutaneous Tissue) exposed. There is a medium amount of serosanguineous drainage noted. There is large (67-100%) red granulation within the wound bed. There is a small (1-33%) amount of necrotic tissue within the wound bed. Assessment Active Problems ICD-10 Pressure ulcer of sacral region, stage 3 Pressure ulcer of other site, stage 3 [name], [name] Gastrostomy status Plan Follow-up Appointments: Return Appointment in 3 weeks. Nurse Visit as needed Home Health: Person: - [facility] for wound care. May utilize formulary equivalent dressing for wound treatment orders unless otherwise specified. Home Health Nurse may visit PRN to address patientoos wound care needs. Scheduled days for dressing changes to be completed; exception, patient has scheduled wound care visit that day. **Please direct any NON-WOUND related issues/requests for orders to patient's Primary Care Physician. **If current dressing causes regression in wound condition, may D/C ordered dressing product/s and apply Normal Saline Moist Dressing daily until next [place] or Other MD appointment. **Notify [facility] of regression in wound condition at [phone]. Bathing/ Shower/ Hygiene: Wash wounds with antibacterial soap and water. May shower; gently cleanse wound with antibacterial soap, rinse and pat dry prior to dressing wounds No tub bath. Off-Loading: Turn and reposition every 2 hours Other: - relive pressure from heels on bed WOUND #8: - Gluteus Wound Laterality: Left Cleanser: Soap and Water (Generic) 3 x Per Week/30 Days Discharge Instructions: Gently cleanse wound  with antibacterial soap, rinse and pat dry prior to dressing wounds Peri-Wound Care: Skin Prep ([place]) 3 x Per Week/30 Days Discharge Instructions: Use skin prep as directed Prim Dressing: Prisma 4.34 (in) (Home Health) (Dispense As Written) 3 x Per Week/30 Days ary Discharge Instructions: Moisten w/normal saline or sterile water; Cover wound as directed. Do not remove from wound bed. Secondary Dressing: (BORDER) Zetuvit Plus SILICONE BORDER Dressing 5x5 (in/in) (Home Health) (Dispense As Written) 3 x Per Week/30 Days Discharge Instructions: Please do not put silicone bordered dressings under wraps. Use non-bordered dressing only. 1. I am recommend currently that we have the patient continue to monitor for any signs of infection or worsening. Based on what I am seeing I do believe that he would benefit from a continuation of therapy with regard to the appropriate offloading which she is doing and I think the collagen is still the best way to go. 2. I am also can recommend patient should continue to utilize the bordered foam dressing to cover. 3. He will also continue to ensure he is not sitting in 1 position for significant amount of time. We will see patient back for reevaluation in 1 week here in the clinic. If anything worsens or changes patient will contact our office for additional recommendations. Electronic Signature(s) Signed: 06/24/2022 5:35:44 PM By: Grier Rocher,  Cylis [name] ([id number]) Worthy Keeler PA-C [id number] Page 8 of 8 Signed: 06/24/2022 5:35:44 PM By: Jenny Reichmann By: Worthy Keeler on 06/24/2022 17:35:44 -------------------------------------------------------------------------------- SuperBill Details Patient Name: Date of Service: [name], Jostin [name]. 06/24/2022 Medical Record Number: [id number] Patient Account Number: [id number] Date of Birth/Sex: Treating RN: 1965-11-11 (56 y.o. [name] Primary Care Provider:  [name] Other Clinician: Referring Provider: Treating Provider/Extender: [name] in Treatment: 36 Diagnosis Coding ICD-10 Codes Code Description [phone] Pressure ulcer of sacral region, stage 3 L89.893 Pressure ulcer of other site, stage 3 [name], [name] Z93.1 Gastrostomy status Facility Procedures : CPT4 Code: [id number] Description: 99213 - WOUND CARE VISIT-LEV 3 EST PT Modifier: Quantity: 1 Physician Procedures : CPT4 Code Description Modifier [id number] - WC PHYS LEVEL 3 - EST PT ICD-10 Diagnosis Description L89.153 Pressure ulcer of sacral region, stage 3 L89.893 Pressure ulcer of other site, stage 3 [name], [name] Z93.1 Gastrostomy  status Quantity: 1 Electronic Signature(s) Signed: 06/24/2022 5:36:27 PM By: Worthy Keeler PA-C Entered By: Worthy Keeler on 06/24/2022 17:36:26

## 2022-06-25 NOTE — Progress Notes (Addendum)
[name], [name] ([id number]) [id number] Page 1 of 8 Visit Report for 06/24/2022 Arrival Information Details Patient Name: Date of Service: [name], [name] 06/24/2022 1:15 PM Medical Record Number: [id number] Patient Account Number: [id number] Date of Birth/Sex: Treating RN: 06/27/1965 (57 y.o. [name] Primary Care Random [name]: [name] Other Clinician: Referring [name]: Treating [name]/Extender: [name] in Treatment: 36 Visit Information History Since Last Visit Added or deleted any medications: Yes Patient Arrived: Wheel Chair Any new allergies or adverse reactions: No Arrival Time: 13:32 Had a fall or experienced change in No Accompanied By: self activities of daily living that may affect Transfer Assistance: [name] Lift risk of falls: Patient Identification Verified: Yes Hospitalized since last visit: No Secondary Verification Process Completed: Yes Has Dressing in Place as Prescribed: Yes Patient Requires Transmission-Based Precautions: No Pain Present Now: Yes Patient Has Alerts: No Electronic Signature(s) Signed: 06/25/2022 8:15:46 AM By: Colin Loud MSN RN CNS WTA Entered By: [name] on 06/24/2022 13:48:19 -------------------------------------------------------------------------------- Clinic Level of Care Assessment Details Patient Name: Date of Service: [name], [name]. 06/24/2022 1:15 PM Medical Record Number: [id number] Patient Account Number: [id number] Date of Birth/Sex: Treating RN: 1966/02/07 (57 y.o. [name] Primary Care [name]: [name] Other Clinician: Referring [name]: Treating [name]/Extender: [name] in Treatment: 36 Clinic Level of Care Assessment Items TOOL 4 Quantity Score X- 1 0 Use when only an [name] is performed on FOLLOW-UP visit ASSESSMENTS - Nursing Assessment / Reassessment X- 1 10 Reassessment of Co-morbidities  (includes updates in patient status) X- 1 5 Reassessment of Adherence to Treatment Plan ASSESSMENTS - Wound and Skin A ssessment / Reassessment X - Simple Wound Assessment / Reassessment - one wound 1 5 []  - 0 Complex Wound Assessment / Reassessment - multiple wounds [name], [name] ([id number]) [id number] Page 2 of 8 []  - 0 Dermatologic / Skin Assessment (not related to wound area) ASSESSMENTS - Focused Assessment []  - 0 Circumferential Edema Measurements - multi extremities []  - 0 Nutritional Assessment / Counseling / Intervention []  - 0 Lower Extremity Assessment (monofilament, tuning fork, pulses) []  - 0 Peripheral Arterial Disease Assessment (using hand held doppler) ASSESSMENTS - Ostomy and/or Continence Assessment and Care []  - 0 Incontinence Assessment and Management []  - 0 Ostomy Care Assessment and Management (repouching, etc.) PROCESS - Coordination of Care X - Simple Patient / Family Education for ongoing care 1 15 []  - 0 Complex (extensive) Patient / Family Education for ongoing care X- 1 10 Staff obtains Programmer, systems, Records, T Results / Process Orders est []  - 0 Staff telephones HHA, Nursing Homes / Clarify orders / etc []  - 0 Routine Transfer to another Facility (non-emergent condition) []  - 0 Routine Hospital Admission (non-emergent condition) []  - 0 New Admissions / Biomedical engineer / Ordering NPWT Apligraf, etc. , []  - 0 Emergency Hospital Admission (emergent condition) X- 1 10 Simple Discharge Coordination []  - 0 Complex (extensive) Discharge Coordination PROCESS - Special Needs []  - 0 Pediatric / Minor Patient Management []  - 0 Isolation Patient Management []  - 0 Hearing / Language / Visual special needs []  - 0 Assessment of Community assistance (transportation, D/C planning, etc.) []  - 0 Additional assistance / Altered mentation []  - 0 Support Surface(s) Assessment (bed, cushion, seat,  etc.) INTERVENTIONS - Wound Cleansing / Measurement X - Simple Wound Cleansing - one wound 1 5 []  - 0 Complex Wound Cleansing - multiple wounds X- 1 5 Wound Imaging (photographs - any  number of wounds) []  - 0 Wound Tracing (instead of photographs) X- 1 5 Simple Wound Measurement - one wound []  - 0 Complex Wound Measurement - multiple wounds INTERVENTIONS - Wound Dressings X - Small Wound Dressing one or multiple wounds 1 10 []  - 0 Medium Wound Dressing one or multiple wounds []  - 0 Large Wound Dressing one or multiple wounds []  - 0 Application of Medications - topical []  - 0 Application of Medications - injection INTERVENTIONS - Miscellaneous []  - 0 External ear exam []  - 0 Specimen Collection (cultures, biopsies, blood, body fluids, etc.) []  - 0 Specimen(s) / Culture(s) sent or taken to Lab for analysis [name], [name] ([id number]) [id number] Page 3 of 8 []  - 0 Patient Transfer (multiple staff / Civil Service fast streamer / Similar devices) []  - 0 Simple Staple / Suture removal (25 or less) []  - 0 Complex Staple / Suture removal (26 or more) []  - 0 Hypo / Hyperglycemic Management (close monitor of Blood Glucose) []  - 0 Ankle / Brachial Index (ABI) - do not check if billed separately X- 1 5 Vital Signs Has the patient been seen at the hospital within the last three years: Yes Total Score: 85 Level Of Care: New/Established - Level 3 Electronic Signature(s) Signed: 06/25/2022 8:15:46 AM By: Colin Loud MSN RN CNS WTA Entered By: [name] on 06/24/2022 13:50:11 -------------------------------------------------------------------------------- Encounter Discharge Information Details Patient Name: Date of Service: [name], [name]. 06/24/2022 1:15 PM Medical Record Number: [id number] Patient Account Number: [id number] Date of Birth/Sex: Treating RN: [birth year] (57 y.o. [name] Primary Care [name]: [name] Other  Clinician: Referring [name]: Treating [name]/Extender: [name] in Treatment: 36 Encounter Discharge Information Items Discharge Condition: Stable Ambulatory Status: Wheelchair Discharge Destination: Home Transportation: Private Auto Accompanied By: self Schedule Follow-up Appointment: Yes Clinical Summary of Care: Electronic Signature(s) Signed: 06/25/2022 8:15:46 AM By: Colin Loud MSN RN CNS WTA Entered By: [name] on 06/24/2022 13:51:00 -------------------------------------------------------------------------------- Lower Extremity Assessment Details Patient Name: Date of Service: [name],  [name]. 06/24/2022 1:15 PM Medical Record Number: [id number] Patient Account Number: [id number] Date of Birth/Sex: Treating RN: 01/14/1966 (57 y.o. [name] Primary Care Zhyon Antenucci: [name] Other Clinician: Referring Jad Johansson: Treating Cynda Soule/Extender: Genelle Bal, Prajwal [name] ([id number]) [phone].pdf Page 4 of 8 Weeks in Treatment: 36 Electronic Signature(s) Signed: 06/25/2022 8:15:46 AM By: Colin Loud MSN RN CNS WTA Entered By: [name] on 06/24/2022 13:49:05 -------------------------------------------------------------------------------- Multi Wound Chart Details Patient Name: Date of Service: [name], [name]. 06/24/2022 1:15 PM Medical Record Number: [id number] Patient Account Number: [id number] Date of Birth/Sex: Treating RN: 1965-11-13 (57 y.o. [name] Primary Care Marica Trentham: [name] Other Clinician: Referring Jarone Ostergaard: Treating Petrona Wyeth/Extender: [name] in Treatment: 36 Vital Signs Height(in): 73 Pulse(bpm): 5 Weight(lbs): 154 Blood Pressure(mmHg): 96/66 Body Mass Index(BMI): 20.3 Temperature(F): 97.6 Respiratory Rate(breaths/min): 18 [8:Photos:] [N/A:N/A] Left Gluteus N/A N/A Wound Location: Pressure Injury N/A N/A Wounding  Event: Pressure Ulcer N/A N/A Primary Etiology: Hypotension, Myocardial Infarction, N/A N/A Comorbid History: History of pressure wounds, Rheumatoid Arthritis, Paraplegia, Confinement Anxiety 09/05/2021 N/A N/A Date Acquired: 59 N/A N/A Weeks of Treatment: Open N/A N/A Wound Status: No N/A N/A Wound Recurrence: 0.1x0.1x1 N/A N/A Measurements L x W x D (cm) 0.008 N/A N/A A (cm) : rea 0.008 N/A N/A Volume (cm) : 99.90% N/A N/A % Reduction in A rea: 99.90% N/A N/A % Reduction in Volume: Category/Stage III N/A N/A Classification: Medium  N/A N/A Exudate A mount: Serosanguineous N/A N/A Exudate Type: red, brown N/A N/A Exudate Color: Large (67-100%) N/A N/A Granulation A mount: Red N/A N/A Granulation Quality: Small (1-33%) N/A N/A Necrotic A mount: Fat Layer (Subcutaneous Tissue): Yes N/A N/A Exposed Structures: None N/A N/A Epithelialization: Treatment Notes Haley, Roza Arden [name] ([id number]) [id number] Page 5 of 8 Electronic Signature(s) Signed: 06/25/2022 8:15:46 AM By: Colin Loud MSN RN CNS WTA Entered By: [name] on 06/24/2022 13:49:11 -------------------------------------------------------------------------------- Pain Assessment Details Patient Name: Date of Service: [name], [name]. 06/24/2022 1:15 PM Medical Record Number: [id number] Patient Account Number: [id number] Date of Birth/Sex: Treating RN: [birth year] (57 y.o. [name] Primary Care Tirso Laws: [name] Other Clinician: Referring Kennetta Pavlovic: Treating Kyndahl Jablon/Extender: [name] in Treatment: 36 Active Problems Location of Pain Severity and Description of Pain Patient Has Paino Yes Site Locations Rate the pain. Current Pain Level: 7 Pain Management and Medication Current Pain Management: Notes pt states pain baseline 7/10 Electronic Signature(s) Signed: 06/24/2022 4:28:49 PM By: Levora Dredge Signed: 06/25/2022 8:15:46  AM By: Colin Loud MSN RN CNS WTA Entered By: [name] on 06/24/2022 13:48:26 Oswaldo Milian, Daryl [name] ([id number]) [id number] Page 6 of 8 -------------------------------------------------------------------------------- Patient/Caregiver Education Details Patient Name: Date of Service: [name], [name] 3/19/2024andnbsp1:15 PM Medical Record Number: [id number] Patient Account Number: [id number] Date of Birth/Gender: Treating RN: 25-Jan-1966 (57 y.o. [name] Primary Care Physician: [name] Other Clinician: Referring Physician: Treating Physician/Extender: [name] in Treatment: 36 Education Assessment Education Provided To: Patient Education Topics Provided Wound/Skin Impairment: Handouts: Caring for Your Ulcer Methods: Explain/Verbal Responses: State content correctly Electronic Signature(s) Signed: 06/25/2022 8:15:46 AM By: Colin Loud MSN RN CNS WTA Entered By: [name] on 06/24/2022 13:50:28 -------------------------------------------------------------------------------- Wound Assessment Details Patient Name: Date of Service: [name], [name]. 06/24/2022 1:15 PM Medical Record Number: [id number] Patient Account Number: [id number] Date of Birth/Sex: Treating RN: 1965-04-19 (57 y.o. 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 Wound Open Wounding Event: Pressure Injury Status: Date Acquired: 09/05/2021 Comorbid Hypotension, Myocardial Infarction, History of pressure wounds, Weeks Of Treatment: 36 History: Rheumatoid Arthritis, Paraplegia, Confinement Anxiety Clustered Wound: No Photos Wound Measurements Length: (cm) 0.1 [name], [name] ([id number]) Width: (cm) 0 Depth: (cm) 1 Area:  (cm) Volume: (cm) % Reduction in Area: 99.9% [id number] Page 7 of 8 .1 % Reduction in Volume: 99.9% Epithelialization: None 0.008 0.008 Wound Description Classification: Category/Stage III Exudate Amount: Medium Exudate Type: Serosanguineous Exudate Color: red, brown Foul Odor After Cleansing: No Slough/Fibrino No Wound Bed Granulation Amount: Large (67-100%) Exposed Structure Granulation Quality: Red Fat Layer (Subcutaneous Tissue) Exposed: Yes Necrotic Amount: Small (1-33%) Treatment Notes Wound #8 (Gluteus) Wound Laterality: Left Cleanser Soap and Water Discharge Instruction: Gently cleanse wound with antibacterial soap, rinse and pat dry prior to dressing wounds Peri-Wound Care Skin Prep Discharge Instruction: Use skin prep as directed Topical Primary Dressing Prisma 4.34 (in) Discharge Instruction: Moisten w/normal saline or sterile water; Cover wound as directed. Do not remove from wound bed. Secondary Dressing (BORDER) Zetuvit Plus SILICONE BORDER Dressing 5x5 (in/in) Discharge Instruction: Please do not put silicone bordered dressings under wraps. Use non-bordered dressing only. Secured With Compression Wrap Compression Stockings Environmental education officer) Signed: 06/24/2022 4:28:49 PM By: Levora Dredge Signed: 06/25/2022 8:15:46 AM By: Colin Loud MSN RN CNS  WTA Entered By: [name] on 06/24/2022 13:48:55 -------------------------------------------------------------------------------- Vitals Details Patient Name: Date of Service: [name], [name] 06/24/2022 1:15 PM Medical Record Number: [id number] Patient Account Number: [id number] Date of Birth/Sex: Treating RN: Sep 28, 1965 (57 y.o. [name] Primary Care Skyy Nilan: [name] Other Clinician: Referring Constancia Geeting: Treating Alysha Doolan/Extender: [name] in Treatment: [address] [name], [name] [name] ([id number])  [id number] Page 8 of 8 Time Taken: 13:33 Temperature (F): 97.6 Height (in): 73 Pulse (bpm): 78 Weight (lbs): 154 Respiratory Rate (breaths/min): 18 Body Mass Index (BMI): 20.3 Blood Pressure (mmHg): 96/66 Reference Range: 80 - 120 mg / dl Electronic Signature(s) Signed: 06/25/2022 8:15:46 AM By: Colin Loud MSN RN CNS WTA Entered By: [name] on 06/24/2022 13:48:22

## 2022-06-27 DIAGNOSIS — G629 Polyneuropathy, unspecified: Secondary | ICD-10-CM | POA: Insufficient documentation

## 2022-06-27 DIAGNOSIS — Z7409 Other reduced mobility: Secondary | ICD-10-CM | POA: Insufficient documentation

## 2022-06-27 DIAGNOSIS — L209 Atopic dermatitis, unspecified: Secondary | ICD-10-CM | POA: Insufficient documentation

## 2022-07-15 ENCOUNTER — Encounter: Payer: Medicare Other | Attending: Physician Assistant | Admitting: Physician Assistant

## 2022-07-15 DIAGNOSIS — L89893 Pressure ulcer of other site, stage 3: Secondary | ICD-10-CM | POA: Insufficient documentation

## 2022-07-15 DIAGNOSIS — L89153 Pressure ulcer of sacral region, stage 3: Secondary | ICD-10-CM | POA: Insufficient documentation

## 2022-07-15 DIAGNOSIS — Z931 Gastrostomy status: Secondary | ICD-10-CM | POA: Diagnosis not present

## 2022-07-15 DIAGNOSIS — G8254 Quadriplegia, C5-C7 incomplete: Secondary | ICD-10-CM | POA: Diagnosis not present

## 2022-07-15 NOTE — Progress Notes (Signed)
[name], [name] ([id number]) [id number] Page 1 of 7 Visit Report for 07/15/2022 Chief Complaint Document Details Patient Name: Date of Service: [name], [name] 07/15/2022 1:15 PM Medical Record Number: [id number] Patient Account Number: [id number] Date of Birth/Sex: Treating RN: 23-Oct-1965 (57 y.o. [name] Primary Care Provider: Pearson Grippe Other Clinician: Referring Provider: Treating Provider/Extender: Cleotis Lema in Treatment: 39 Information Obtained from: Patient Chief Complaint Sacral and back pressure ulcers Electronic Signature(s) Unsigned Previous Signature: 07/15/2022 1:20:11 PM Version By: Lenda Kelp PA-C Entered By: Midge Aver on 07/15/2022 13:42:10 -------------------------------------------------------------------------------- HPI Details Patient Name: Date of Service: [name], [name]. 07/15/2022 1:15 PM Medical Record Number: [id number] Patient Account Number: [id number] Date of Birth/Sex: Treating RN: 1966-04-06 (57 y.o. [name] Primary Care Provider: Pearson Grippe Other Clinician: Referring Provider: Treating Provider/Extender: Cleotis Lema in Treatment: 58 History of Present Illness ssociated Signs and Symptoms: Patient has a history of confirmed osteomyelitis of the left foot according to MRI July 2019. 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 HPI Description: 12/08/17 on evaluation today patient presents for initial evaluation and our clinic concerning issues he has been having with his feet bilaterally although the left more than right most recently. He was admitted to hospital where he was placed on IV vancomycin which actually he continue to utilize until discharge and even when discharged continue to be on until around 16 August according to what he tells me. His PICC line was removed at that  point. Nonetheless he has been seen in the wound care center in [facility] before transferring to [place] due to the fact that the doctor there left and they are no longer open. Nonetheless he does have significant osteomyelitis of the left foot noted on MRI which revealed significant issues with necrotic bone including a portion of the distal region of his left great toe which was felt to possibly either be missing as a result of amputation or potentially resorption. Either way he also had osteomyelitis noted of the digit, metatarsal region, cuneiform, and navicular bones. There is definite bone exposure noted externally at this point in time. In regard to the right foot he has issues with ulcerations here as well although he states that recently he's had no issues until this just blistered and reopened. Apparently Xeroform was used in this has caused things to be somewhat more moist and open more according to the patient. He's otherwise been tolerating the dressing changes without complication using silver alginate dressings. He has no discomfort but does seem to be extremely stressed regarding the fact that he did see Dr. 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 The patient stated per notes reviewed that he did not want to have any surgery and therefore has a repeat appointment scheduled with the surgeon on 12/15/17. With all that being said he feels like that he really has not been alerted to what was going on in the severity that was until just recently and has a lot of questions in that regard. I'll be see I explained that seeing him for the first time today I cannot answer a lot of those questions that he has. 12/21/17 on evaluation today patient actually appears to be doing a little better in regard to the right foot in particular. There is one area where there still definitive bone noted although the MRI  which I did review today that we had ordered was  negative for any evidence of osteomyelitis which is good news. Nonetheless there was some necrotic bone noted. Overall the patient appears to be doing fairly well however in regard to the right foot. Unfortunately he does have a new open area on the posterior aspect of his left leg as well as the continued area of necrosis noted in the central portion of the foot. He states that he's actually going to be sent for reevaluation specifically to a limb salvage clinic at [facility] he believes this is something that is primary care provider has been working with. They were just awaiting the results of the MRI. 01/08/18 on evaluation today patient actually appears to be doing maybe just a little better in regard to the overall appearance of his bilateral foot ulcers. In general this does not seem to have worsened at least which is good news. He still has evidence of bone noted on the surface of both wound areas which though dry appearing really has not otherwise changed at this time. He actually has an appointment at [facility] with the wound center to discuss limb salvage. He discusses with me at the last visit. Nonetheless he was somewhat upset today about a couple things one being that he stated that we did not put the order in for the protective dressing for his knees that we discussed last time. With that being said I had put in the order for the protective dressing in fact it was on his order sheet that was signed on 12/21/17. Nonetheless unfortunately it appears this was overlooked by home health and they told him if they had the order they could put the protective dressing on. Nonetheless they told him they did not have the order. Again I believe this was an oversight nothing intentional on anyone's part nonetheless the order was there. Still I had explained to the patient sometimes insurance will not cover for the protective dressing even if it is ordered and this was discussed in the last  visit. 01/25/18 on evaluation today patient presents for follow-up concerning his bilateral lower extremity ulcers. He has actually been doing about the same since I last saw him. We do have them in the room we can look at his gluteal region today as well to ensure that there is nothing open or if so address the issue there. Nonetheless he was supposed to see you and see wound care/limb salvage prior to his visit with me today. Unfortunately when he arrived last time at their clinic he apparently was there on the wrong day the doctor was not even present that he was his to be seen. Nonetheless I had to be switched to a different day and the patient actually is going in the next week to see them. Assuming everything works out this should actually be tomorrow. Nonetheless if they take MOMIN, MISKO [name] ([id number]) [id number] Page 2 of 7 over care of that point then we will subsequently release care to them. Readmission: 10-15-2021 upon evaluation today patient appears to be doing somewhat poorly in regard to wounds over the left gluteal region. He also has areas in the midline sacrum although this is not quite as bad. There is however some evidence of necrotic bone noted at this point unfortunately. The question is whether this is just something working its way out or if it something that is actually actively infected at this point. We are going to have to evaluate  that further little by little as we go with additional diagnostics. With that being said the patient tells me currently that what we are seeing him for has been open since around 09-05-2021 for the gluteal area on the left and in regard to the sacrum 10-01-2021 he states. With that being said he again is not too significant as far as the overall appearance of the wounds are concerned but again with the bone exposed this is something that we may want to do at pathology and culture in regard to. He has  previously been treated for osteomyelitis of the sacral area. 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 This is a gentleman whom I have previously seen back in 2019. The wound that we were taking care of back at that time is completely cleared. 7/25; 2-week follow-up. The patient has 3 small open areas in the lower sacrum and then an area over the left ischial tuberosity. Both of these are stage III wounds. He has been using Hydrofera Blue. 11-12-2021 upon evaluation today patient appears to be doing well currently in regard to his wounds. The distal sacral area is almost completely closed and looks to be doing great. In regard to the left gluteal region this is showing signs of improvement from a depth perspective size is about the same. Overall I am extremely pleased with where things stand compared to what we were previous. 12-03-2021 upon evaluation today patient appears to be doing well currently in regard to his wound. He is actually showing signs of improvement the sacral region and the right ischial/gluteal region is completely healed. The left ischial/gluteal region is still open but seems to be doing much better. 12-24-2021 upon evaluation today patient appears to be doing well currently in regard to some of his wounds others have reopened or are not doing quite as well. Overall though I think that he is on a good track he just may be needs to be a little bit better on appropriate offloading we discussed that today as well. 01-27-2022 upon evaluation today patient appears to be doing well currently in regard to his wound. He is not showing any signs of significant tissue breakdown but unfortunately the wound is larger than what it was last time I saw him. He tells me that this seems to be a complete shock based on what he is hearing today. He tells me that his son was telling him this has been doing well and that it was smaller and not really  bleeding or draining much. Again this is definitely significantly larger than last time I saw him but does not appear to be obviously infected there is some undermining noted as well. In general I am unsure of what would have caused such a dramatic changes what he seems to feel has happened here but nonetheless indeed I do not feel like that this is doing nearly as well as last time I saw him. 11/6; his wound measured better today per our intake nurse using silver alginate and border foam although according to the patient were having trouble with supplies especially the border foam. He has home health. 02-24-2022 upon evaluation today patient appears to be doing better in regard to his wound. The sacral area is looking like it is trying to break down a little bit is not actually open right now but we definitely need to be careful in this regard. I discussed that with him today. With that  being said the patient tells me that he definitely is not changing positions as frequently as he should and I think that is the primary cause of what we are seeing here. Fortunately there does not appear to be any signs of infection locally or systemically at this time. No fevers, chills, nausea, vomiting, or diarrhea. 03-11-2022 upon evaluation today patient is actually making excellent improvement in general. I am extremely pleased with where things stand I do believe that he is headed on the right direction here. 03-25-2022 upon evaluation today patient appears to be doing well currently in regard to his wounds for the most part although the sacral area appears to be a little bit more broken down than what we would have expected. Fortunately there does not appear to be any signs of infection he tells me that he is not exactly sure what happened that caused this to breakdown to the degree that it is. Fortunately there does not appear to be any signs of active infection locally nor systemically at this time which is  great news. No fevers, chills, nausea, vomiting, or diarrhea. 04-22-2022 upon evaluation today patient's wound actually appears to be doing well on the left and the gluteal fold location. With that being said in regard to the sacral area this is open and a little bit more slough covered at this point. Fortunately I do not see any signs of infection locally nor systemically which is great news and overall I am extremely pleased with where things stand today. Overall I am very happy with where we are and I do think that he can get the sacral area he will get again but we need to keep a close eye on everything for sure. He does tell me he is good to be more aggressive with appropriate offloading which I think is definitely a good way to go. 05-06-2022 upon evaluation today patient appears to be doing well currently in regard to his wound on the sacral area though it is a little bit hyper granulated at this point unfortunately. Otherwise this seems to be doing overall decently well. In general I think that we are headed in the right direction. In regards to the wound in the left gluteal region this does have a little bit of depth to it but still seems to be showing signs of improvement which is great news and I am pleased in that regard. 05-20-2022 upon evaluation today patient appears to be doing better in regard to his wounds although he tells me he has been having to make to as he did not have the supplies ordered that he needed for dressing changes. Fortunately there does not appear to be any signs of active infection locally nor systemically which is great news. No fevers, chills, nausea, vomiting, or diarrhea. 06-03-2022 upon evaluation today patient actually appears to be doing excellent in fact his sacral wound is completely healed after the silver nitrate 2 weeks ago. Fortunately I do not see any signs of active infection locally nor systemically at this time. 06-24-2022 upon evaluation today patient  actually appears to be making excellent progress in regard to his wounds. He just has 1 left on the left hip/trochanter location and this 1 remaining spot is very tiny just a pinpoint. 07-15-2022 upon evaluation today patient appears to be doing well with regard to his wounds. In fact the wound remaining is almost completely closed and is showing signs of significant improvement. I am very pleased in that regard. There is  no signs of active infection at this point. Electronic Signature(s) Signed: 07/15/2022 2:27:39 PM By: Allen Derry PA-C Entered By: Lenda Kelp on 07/15/2022 14:27:38 -------------------------------------------------------------------------------- Physical Exam Details Patient Name: Date of Service: [name], [name] 07/15/2022 1:15 PM Medical Record Number: [id number] Patient Account Number: [id number] Date of Birth/Sex: Treating RN: [birth year] (57 y.o. [name] Primary Care Provider: Pearson Grippe Other Clinician: Referring Provider: Treating Provider/Extender: Cleotis Lema in Treatment: [address] [name] ([id number]) [id number] Page 3 of 7 Constitutional Well-nourished and well-hydrated in no acute distress. Respiratory normal breathing without difficulty. Psychiatric this patient is able to make decisions and demonstrates good insight into disease process. Alert and Oriented x 3. pleasant and cooperative. Notes Upon inspection patient's wound bed actually showed signs of good granulation epithelization at this point. I do not see any evidence of worsening overall and I think that the patient is making excellent progress here. No fevers, chills, nausea, vomiting, or diarrhea. Electronic Signature(s) Signed: 07/15/2022 2:28:17 PM By: Allen Derry PA-C Entered By: Lenda Kelp on 07/15/2022 14:28:17 -------------------------------------------------------------------------------- Physician Orders  Details Patient Name: Date of Service: [address] [name]. 07/15/2022 1:15 PM Medical Record Number: [id number] Patient Account Number: [id number] Date of Birth/Sex: Treating RN: 07/13/1965 (57 y.o. [name] Primary Care Provider: Pearson Grippe Other Clinician: Referring Provider: Treating Provider/Extender: Cleotis Lema in Treatment: 39 Verbal / Phone Orders: No Diagnosis Coding ICD-10 Coding Code Description L89.153 Pressure ulcer of sacral region, stage 3 L89.893 Pressure ulcer of other site, stage 3 G82.54 Quadriplegia, C5-C7 incomplete Z93.1 Gastrostomy status Follow-up Appointments Return Appointment in 3 weeks. Nurse Visit as needed Home Health Home Health Company: - [facility] for wound care. May utilize formulary equivalent dressing for wound treatment orders unless otherwise specified. Home Health Nurse may visit PRN to address patients wound care needs. Scheduled days for dressing changes to be completed; exception, patient has scheduled wound care visit that day. **Please direct any NON-WOUND related issues/requests for orders to patient's Primary Care Physician. **If current dressing causes regression in wound condition, may D/C ordered dressing product/s and apply Normal Saline Moist Dressing daily until next Wound Healing Center or Other MD appointment. **Notify Wound Healing Center of regression in wound condition at [phone]. Bathing/ [employer] wounds with antibacterial soap and water. May shower; gently cleanse wound with antibacterial soap, rinse and pat dry prior to dressing wounds No tub bath. Off-Loading Turn and reposition every 2 hours Other: - relive pressure from heels on bed Wound Treatment Wound #8 - Gluteus Wound Laterality: Left Cleanser: Soap and Water (Generic) 3 x Per Week/30 Days Discharge Instructions: Gently cleanse wound with antibacterial soap, rinse and pat dry prior to dressing  wounds Peri-Wound Care: Skin Prep (Home Health) 3 x Per Week/30 Days Discharge Instructions: Use skin prep as directed Prim Dressing: Prisma 4.34 (in) (Home Health) (Dispense As Written) 3 x Per Week/30 Days ary Discharge Instructions: Moisten w/normal saline or sterile water; Cover wound as directed. Do not remove from wound bed. Secondary Dressing: (BORDER) Zetuvit Plus SILICONE BORDER Dressing 5x5 (in/in) (Home Health) (Dispense As Written) 3 x Per Week/30 Days Discharge Instructions: Please do not put silicone bordered dressings under wraps. Use non-bordered dressing only. [name], [name] ([id number]) [id number] Page 4 of 7 Electronic Signature(s) Unsigned Entered By: Midge Aver on 07/15/2022 13:40:14 -------------------------------------------------------------------------------- Problem List Details Patient Name: Date of Service: [name], [name]. 07/15/2022 1:15 PM Medical Record  Number: [id number] Patient Account Number: [id number] Date of Birth/Sex: Treating RN: 1966-03-03 (57 y.o. [name] Primary Care Provider: Pearson Grippe Other Clinician: Referring Provider: Treating Provider/Extender: Cleotis Lema in Treatment: 39 Active Problems ICD-10 Encounter Code Description Active Date MDM Diagnosis L89.153 Pressure ulcer of sacral region, stage 3 10/15/2021 No Yes L89.893 Pressure ulcer of other site, stage 3 10/15/2021 No Yes G82.54 Quadriplegia, C5-C7 incomplete 10/15/2021 No Yes Z93.1 Gastrostomy status 10/15/2021 No Yes Inactive Problems Resolved Problems Electronic Signature(s) Unsigned Previous Signature: 07/15/2022 1:20:05 PM Version By: Lenda Kelp PA-C Entered By: Midge Aver on 07/15/2022 13:42:02 -------------------------------------------------------------------------------- Progress Note Details Patient Name: Date of Service: [address] [name]. 07/15/2022 1:15 PM Medical Record Number:  [id number] Patient Account Number: [id number] Date of Birth/Sex: Treating RN: [birth year] (57 y.o. [name] Primary Care Provider: Pearson Grippe Other Clinician: Referring Provider: Treating Provider/Extender: Cleotis Lema in Treatment: 39 Subjective Chief Complaint Information obtained from Patient Sacral and back pressure ulcers History of Present Illness (HPI) [name], [name] ([id number]) [id number] Page 5 of 7 The following HPI elements were documented for the patient's wound: Associated Signs and Symptoms: Patient has a history of confirmed osteomyelitis of the left foot according to MRI July 2019. 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 12/08/17 on evaluation today patient presents for initial evaluation and our clinic concerning issues he has been having with his feet bilaterally although the left more than right most recently. He was admitted to hospital where he was placed on IV vancomycin which actually he continue to utilize until discharge and even when discharged continue to be on until around 16 August according to what he tells me. His PICC line was removed at that point. Nonetheless he has been seen in the wound care center in [facility] before transferring to [place] due to the fact that the doctor there left and they are no longer open. Nonetheless he does have significant osteomyelitis of the left foot noted on MRI which revealed significant issues with necrotic bone including a portion of the distal region of his left great toe which was felt to possibly either be missing as a result of amputation or potentially resorption. Either way he also had osteomyelitis noted of the digit, metatarsal region, cuneiform, and navicular bones. There is definite bone exposure noted externally at this point in time. In regard to the right foot he has issues with  ulcerations here as well although he states that recently he's had no issues until this just blistered and reopened. Apparently Xeroform was used in this has caused things to be somewhat more moist and open more according to the patient. He's otherwise been tolerating the dressing changes without complication using silver alginate dressings. He has no discomfort but does seem to be extremely stressed regarding the fact that he did see Dr. 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 The patient stated per notes reviewed that he did not want to have any surgery and therefore has a repeat appointment scheduled with the surgeon on 12/15/17. With all that being said he feels like that he really has not been alerted to what was going on in the severity that was until just recently and has a lot of questions in that regard. I'll be see I explained that seeing him for the first time today I cannot answer a lot of those questions  that he has. 12/21/17 on evaluation today patient actually appears to be doing a little better in regard to the right foot in particular. There is one area where there still definitive bone noted although the MRI which I did review today that we had ordered was negative for any evidence of osteomyelitis which is good news. Nonetheless there was some necrotic bone noted. Overall the patient appears to be doing fairly well however in regard to the right foot. Unfortunately he does have a new open area on the posterior aspect of his left leg as well as the continued area of necrosis noted in the central portion of the foot. He states that he's actually going to be sent for reevaluation specifically to a limb salvage clinic at [facility] he believes this is something that is primary care provider has been working with. They were just awaiting the results of the MRI. 01/08/18 on evaluation today patient actually appears to be doing maybe just a little better  in regard to the overall appearance of his bilateral foot ulcers. In general this does not seem to have worsened at least which is good news. He still has evidence of bone noted on the surface of both wound areas which though dry appearing really has not otherwise changed at this time. He actually has an appointment at [facility] with the wound center to discuss limb salvage. He discusses with me at the last visit. Nonetheless he was somewhat upset today about a couple things one being that he stated that we did not put the order in for the protective dressing for his knees that we discussed last time. With that being said I had put in the order for the protective dressing in fact it was on his order sheet that was signed on 12/21/17. Nonetheless unfortunately it appears this was overlooked by home health and they told him if they had the order they could put the protective dressing on. Nonetheless they told him they did not have the order. Again I believe this was an oversight nothing intentional on anyone's part nonetheless the order was there. Still I had explained to the patient sometimes insurance will not cover for the protective dressing even if it is ordered and this was discussed in the last visit. 01/25/18 on evaluation today patient presents for follow-up concerning his bilateral lower extremity ulcers. He has actually been doing about the same since I last saw him. We do have them in the room we can look at his gluteal region today as well to ensure that there is nothing open or if so address the issue there. Nonetheless he was supposed to see you and see wound care/limb salvage prior to his visit with me today. Unfortunately when he arrived last time at their clinic he apparently was there on the wrong day the doctor was not even present that he was his to be seen. Nonetheless I had to be switched to a different day and the patient actually is going in the next week to see them. Assuming everything  works out this should actually be tomorrow. Nonetheless if they take over care of that point then we will subsequently release care to them. Readmission: 10-15-2021 upon evaluation today patient appears to be doing somewhat poorly in regard to wounds over the left gluteal region. He also has areas in the midline sacrum although this is not quite as bad. There is however some evidence of necrotic bone noted at this point unfortunately. The question is  whether this is just something working its way out or if it something that is actually actively infected at this point. We are going to have to evaluate that further little by little as we go with additional diagnostics. With that being said the patient tells me currently that what we are seeing him for has been open since around 09-05-2021 for the gluteal area on the left and in regard to the sacrum 10-01-2021 he states. With that being said he again is not too significant as far as the overall appearance of the wounds are concerned but again with the bone exposed this is something that we may want to do at pathology and culture in regard to. 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 This is a gentleman whom I have previously seen back in 2019. The wound that we were taking care of back at that time is completely cleared. 7/25; 2-week follow-up. The patient has 3 small open areas in the lower sacrum and then an area over the left ischial tuberosity. Both of these are stage III wounds. He has been using Hydrofera Blue. 11-12-2021 upon evaluation today patient appears to be doing well currently in regard to his wounds. The distal sacral area is almost completely closed and looks to be doing great. In regard to the left gluteal region this is showing signs of improvement from a depth perspective size is about the same. Overall I am extremely  pleased with where things stand compared to what we were previous. 12-03-2021 upon evaluation today patient appears to be doing well currently in regard to his wound. He is actually showing signs of improvement the sacral region and the right ischial/gluteal region is completely healed. The left ischial/gluteal region is still open but seems to be doing much better. 12-24-2021 upon evaluation today patient appears to be doing well currently in regard to some of his wounds others have reopened or are not doing quite as well. Overall though I think that he is on a good track he just may be needs to be a little bit better on appropriate offloading we discussed that today as well. 01-27-2022 upon evaluation today patient appears to be doing well currently in regard to his wound. He is not showing any signs of significant tissue breakdown but unfortunately the wound is larger than what it was last time I saw him. He tells me that this seems to be a complete shock based on what he is hearing today. He tells me that his son was telling him this has been doing well and that it was smaller and not really bleeding or draining much. Again this is definitely significantly larger than last time I saw him but does not appear to be obviously infected there is some undermining noted as well. In general I am unsure of what would have caused such a dramatic changes what he seems to feel has happened here but nonetheless indeed I do not feel like that this is doing nearly as well as last time I saw him. 11/6; his wound measured better today per our intake nurse using silver alginate and border foam although according to the patient were having trouble with supplies especially the border foam. He has home health. 02-24-2022 upon evaluation today patient appears to be doing better in regard to his wound. The sacral area is looking like it is trying to break down  a little bit is not actually open right now but we definitely  need to be careful in this regard. I discussed that with him today. With that being said the patient tells me that he definitely is not changing positions as frequently as he should and I think that is the primary cause of what we are seeing here. Fortunately there does not appear to be any signs of infection locally or systemically at this time. No fevers, chills, nausea, vomiting, or diarrhea. 03-11-2022 upon evaluation today patient is actually making excellent improvement in general. I am extremely pleased with where things stand I do believe that he is headed on the right direction here. 03-25-2022 upon evaluation today patient appears to be doing well currently in regard to his wounds for the most part although the sacral area appears to be a little bit more broken down than what we would have expected. Fortunately there does not appear to be any signs of infection he tells me that he is not exactly sure what happened that caused this to breakdown to the degree that it is. Fortunately there does not appear to be any signs of active infection locally nor systemically at this time which is great news. No fevers, chills, nausea, vomiting, or diarrhea. 04-22-2022 upon evaluation today patient's wound actually appears to be doing well on the left and the gluteal fold location. With that being said in regard to the sacral area this is open and a little bit more slough covered at this point. Fortunately I do not see any signs of infection locally nor systemically which is great [name], [name] ([id number]) [id number].pdf Page 6 of 7 news and overall I am extremely pleased with where things stand today. Overall I am very happy with where we are and I do think that he can get the sacral area he will get again but we need to keep a close eye on everything for sure. He does tell me he is good to be more aggressive with appropriate offloading which I think is definitely a good way  to go. 05-06-2022 upon evaluation today patient appears to be doing well currently in regard to his wound on the sacral area though it is a little bit hyper granulated at this point unfortunately. Otherwise this seems to be doing overall decently well. In general I think that we are headed in the right direction. In regards to the wound in the left gluteal region this does have a little bit of depth to it but still seems to be showing signs of improvement which is great news and I am pleased in that regard. 05-20-2022 upon evaluation today patient appears to be doing better in regard to his wounds although he tells me he has been having to make to as he did not have the supplies ordered that he needed for dressing changes. Fortunately there does not appear to be any signs of active infection locally nor systemically which is great news. No fevers, chills, nausea, vomiting, or diarrhea. 06-03-2022 upon evaluation today patient actually appears to be doing excellent in fact his sacral wound is completely healed after the silver nitrate 2 weeks ago. Fortunately I do not see any signs of active infection locally nor systemically at this time. 06-24-2022 upon evaluation today patient actually appears to be making excellent progress in regard to his wounds. He just has 1 left on the left hip/trochanter location and this 1 remaining spot is very tiny just a pinpoint. 07-15-2022 upon evaluation today  patient appears to be doing well with regard to his wounds. In fact the wound remaining is almost completely closed and is showing signs of significant improvement. I am very pleased in that regard. There is no signs of active infection at this point. Objective Constitutional Well-nourished and well-hydrated in no acute distress. Vitals Time Taken: 1:25 PM, Height: 73 in, Weight: 154 lbs, BMI: 20.3, Temperature: 98.4 F, Pulse: 110 bpm, Respiratory Rate: 18 breaths/min, Blood Pressure: 106/90  mmHg. Respiratory normal breathing without difficulty. Psychiatric this patient is able to make decisions and demonstrates good insight into disease process. Alert and Oriented x 3. pleasant and cooperative. General Notes: Upon inspection patient's wound bed actually showed signs of good granulation epithelization at this point. I do not see any evidence of worsening overall and I think that the patient is making excellent progress here. No fevers, chills, nausea, vomiting, or diarrhea. Integumentary (Hair, Skin) Wound #8 status is Open. Original cause of wound was Pressure Injury. The date acquired was: 09/05/2021. The wound has been in treatment 39 weeks. The wound is located on the Left Gluteus. The wound measures 0.2cm length x 0.2cm width x 0.3cm depth; 0.031cm^2 area and 0.009cm^3 volume. There is Fat Layer (Subcutaneous Tissue) exposed. There is a medium amount of serosanguineous drainage noted. There is large (67-100%) red granulation within the wound bed. There is a small (1-33%) amount of necrotic tissue within the wound bed. Assessment Active Problems ICD-10 Pressure ulcer of sacral region, stage 3 Pressure ulcer of other site, stage 3 Quadriplegia, C5-C7 incomplete Gastrostomy status Plan Follow-up Appointments: Return Appointment in 3 weeks. Nurse Visit as needed Home Health: Home Health Company: - [facility] for wound care. May utilize formulary equivalent dressing for wound treatment orders unless otherwise specified. Home Health Nurse may visit PRN to address patientoos wound care needs. Scheduled days for dressing changes to be completed; exception, patient has scheduled wound care visit that day. **Please direct any NON-WOUND related issues/requests for orders to patient's Primary Care Physician. **If current dressing causes regression in wound condition, may D/C ordered dressing product/s and apply Normal Saline Moist Dressing daily until next Wound  Healing Center or Other MD appointment. **Notify Wound Healing Center of regression in wound condition at [phone]. Bathing/ Shower/ Hygiene: Wash wounds with antibacterial soap and water. [name], [name] ([id number]) [id number].pdf Page 7 of 7 May shower; gently cleanse wound with antibacterial soap, rinse and pat dry prior to dressing wounds No tub bath. Off-Loading: Turn and reposition every 2 hours Other: - relive pressure from heels on bed WOUND #8: - Gluteus Wound Laterality: Left Cleanser: Soap and Water (Generic) 3 x Per Week/30 Days Discharge Instructions: Gently cleanse wound with antibacterial soap, rinse and pat dry prior to dressing wounds Peri-Wound Care: Skin Prep (Home Health) 3 x Per Week/30 Days Discharge Instructions: Use skin prep as directed Prim Dressing: Prisma 4.34 (in) (Home Health) (Dispense As Written) 3 x Per Week/30 Days ary Discharge Instructions: Moisten w/normal saline or sterile water; Cover wound as directed. Do not remove from wound bed. Secondary Dressing: (BORDER) Zetuvit Plus SILICONE BORDER Dressing 5x5 (in/in) (Home Health) (Dispense As Written) 3 x Per Week/30 Days Discharge Instructions: Please do not put silicone bordered dressings under wraps. Use non-bordered dressing only. 1. I would recommend that we have the patient continue to monitor for any signs of infection or worsening. Office if anything changes he knows to contact the office and let me know. 2. I am going to suggest as  well that the patient should continue to elevate his legs much as possible if anything changes he knows he can contact the office and let me know. We are going to have the patient continue specifically with the [facility] along with the bordered foam dressing to cover. We will see patient back for reevaluation in 1 week here in the clinic. If anything worsens or changes patient will contact our office for  additional recommendations. Electronic Signature(s) Signed: 07/15/2022 2:28:51 PM By: Allen Derry PA-C Entered By: Lenda Kelp on 07/15/2022 14:28:51 -------------------------------------------------------------------------------- SuperBill Details Patient Name: Date of Service: [name], [name]. 07/15/2022 Medical Record Number: [id number] Patient Account Number: [id number] Date of Birth/Sex: Treating RN: 07-15-1965 (57 y.o. [name] Primary Care Provider: Pearson Grippe Other Clinician: Referring Provider: Treating Provider/Extender: Cleotis Lema in Treatment: 39 Diagnosis Coding ICD-10 Codes Code Description [phone] Pressure ulcer of sacral region, stage 3 L89.893 Pressure ulcer of other site, stage 3 G82.54 Quadriplegia, C5-C7 incomplete Z93.1 Gastrostomy status Facility Procedures : CPT4 Code: [id number] Description: 99213 - WOUND CARE VISIT-LEV 3 EST PT Modifier: Quantity: 1 Physician Procedures : CPT4 Code Description Modifier [id number] 99213 - WC PHYS LEVEL 3 - EST PT ICD-10 Diagnosis Description L89.153 Pressure ulcer of sacral region, stage 3 L89.893 Pressure ulcer of other site, stage 3 G82.54 Quadriplegia, C5-C7 incomplete Z93.1 Gastrostomy  status Quantity: 1 Electronic Signature(s) Signed: 07/15/2022 2:29:07 PM By: Allen Derry PA-C Entered By: Lenda Kelp on 07/15/2022 14:29:07

## 2022-07-16 NOTE — Progress Notes (Signed)
[name], SHEETS B ([id number]) [id number] Page 1 of Colin Visit Report for 07/15/2022 Arrival Information Details Patient Name: Date of Service: [name], [name] 07/15/2022 1:15 PM Medical Record Number: [id number] Patient Account Number: [id number] Date of Birth/Sex: Treating RN: 11/16/1965 (57 y.o. [name] Primary Care Starletta Houchin: Pearson Grippe Other Clinician: Referring Curry Seefeldt: Treating Yani Coventry/Extender: Cleotis Lema in Treatment: 39 Visit Information History Since Last Visit Added or deleted any medications: No Patient Arrived: Wheel Chair Has Dressing in Place as Prescribed: Yes Arrival Time: 13:18 Pain Present Now: No Accompanied By: self Transfer Assistance: Michiel Sites Lift Patient Identification Verified: Yes Secondary Verification Process Completed: Yes Patient Requires Transmission-Based Precautions: No Patient Has Alerts: No Electronic Signature(s) Signed: 07/15/2022 4:39:44 PM By: Midge Aver MSN RN CNS WTA Previous Signature: 07/15/2022 1:51:15 PM Version By: Midge Aver MSN RN CNS WTA Entered By: Midge Aver on 07/15/2022 16:39:44 -------------------------------------------------------------------------------- Clinic Level of Care Assessment Details Patient Name: Date of Service: [name], Colin B. 07/15/2022 1:15 PM Medical Record Number: [id number] Patient Account Number: [id number] Date of Birth/Sex: Treating RN: 18-Aug-1965 (57 y.o. [name] Primary Care [name]: Pearson Grippe Other Clinician: Referring [name]: Treating [name]/Extender: Cleotis Lema in Treatment: 39 Clinic Level of Care Assessment Items TOOL 4 Quantity Score X- 1 0 Use when only an EandM is performed on FOLLOW-UP visit ASSESSMENTS - Nursing Assessment / Reassessment X- 1 10 Reassessment of Co-morbidities (includes updates in patient status) X- 1 5 Reassessment of Adherence to Treatment Plan ASSESSMENTS - Wound and  Skin A ssessment / Reassessment X - Simple Wound Assessment / Reassessment - one wound 1 5 []  - 0 Complex Wound Assessment / Reassessment - multiple wounds []  - 0 Dermatologic / Skin Assessment (not related to wound area) ASSESSMENTS - Focused Assessment []  - 0 Circumferential Edema Measurements - multi extremities []  - 0 Nutritional Assessment / Counseling / Intervention []  - 0 Lower Extremity Assessment (monofilament, tuning fork, pulses) []  - 0 Peripheral Arterial Disease Assessment (using hand held doppler) ASSESSMENTS - Ostomy and/or Continence Assessment and Care []  - 0 Incontinence Assessment and Management []  - 0 Ostomy Care Assessment and Management (repouching, etc.) PROCESS - Coordination of Care X - Simple Patient / Family Education for ongoing care 1 15 []  - 0 Complex (extensive) Patient / Family Education for ongoing care ODDIS, [name] B ([id number]) [id number] Page 2 of Colin X- 1 10 Staff obtains Consents, Records, T Results / Process Orders est []  - 0 Staff telephones HHA, Nursing Homes / Clarify orders / etc []  - 0 Routine Transfer to another Facility (non-emergent condition) []  - 0 Routine Hospital Admission (non-emergent condition) []  - 0 New Admissions / Manufacturing engineer / Ordering NPWT Apligraf, etc. , []  - 0 Emergency Hospital Admission (emergent condition) X- 1 10 Simple Discharge Coordination []  - 0 Complex (extensive) Discharge Coordination PROCESS - Special Needs []  - 0 Pediatric / Minor Patient Management []  - 0 Isolation Patient Management []  - 0 Hearing / Language / Visual special needs []  - 0 Assessment of Community assistance (transportation, D/C planning, etc.) []  - 0 Additional assistance / Altered mentation []  - 0 Support Surface(s) Assessment (bed, cushion, seat, etc.) INTERVENTIONS - Wound Cleansing / Measurement X - Simple Wound Cleansing - one wound 1 5 []  - 0 Complex Wound Cleansing  - multiple wounds X- 1 5 Wound Imaging (photographs - any number of wounds) []  - 0 Wound Tracing (instead of photographs) X- 1 5 Simple Wound Measurement -  one wound []  - 0 Complex Wound Measurement - multiple wounds INTERVENTIONS - Wound Dressings X - Small Wound Dressing one or multiple wounds 1 10 []  - 0 Medium Wound Dressing one or multiple wounds []  - 0 Large Wound Dressing one or multiple wounds []  - 0 Application of Medications - topical []  - 0 Application of Medications - injection INTERVENTIONS - Miscellaneous []  - 0 External ear exam []  - 0 Specimen Collection (cultures, biopsies, blood, body fluids, etc.) []  - 0 Specimen(s) / Culture(s) sent or taken to Lab for analysis []  - 0 Patient Transfer (multiple staff / Nurse, adult / Similar devices) []  - 0 Simple Staple / Suture removal (25 or less) []  - 0 Complex Staple / Suture removal (26 or more) []  - 0 Hypo / Hyperglycemic Management (close monitor of Blood Glucose) []  - 0 Ankle / Brachial Index (ABI) - do not check if billed separately X- 1 5 Vital Signs Has the patient been seen at the hospital within the last three years: Yes Total Score: 85 Level Of Care: New/Established - Level 3 Electronic Signature(s) Signed: 07/15/2022 5:03:29 PM By: Midge Aver MSN RN CNS WTA Entered By: Midge Aver on 07/15/2022 13:41:01 Jac Canavan, Efren B ([id number]) [id number] Page 3 of Colin -------------------------------------------------------------------------------- Encounter Discharge Information Details Patient Name: Date of Service: Colin, [name] 07/15/2022 1:15 PM Medical Record Number: [id number] Patient Account Number: [id number] Date of Birth/Sex: Treating RN: 05/18/1965 (57 y.o. [name] Primary Care Tanicka Bisaillon: Pearson Grippe Other Clinician: Referring Philicia Heyne: Treating Gregori Abril/Extender: Cleotis Lema in Treatment: 39 Encounter Discharge Information  Items Discharge Condition: Stable Ambulatory Status: Wheelchair Discharge Destination: Home Transportation: Private Auto Accompanied By: self Schedule Follow-up Appointment: Yes Clinical Summary of Care: Electronic Signature(s) Signed: 07/15/2022 4:44:09 PM By: Midge Aver MSN RN CNS WTA Previous Signature: 07/15/2022 4:40:07 PM Version By: Midge Aver MSN RN CNS WTA Entered By: Midge Aver on 07/15/2022 16:44:09 -------------------------------------------------------------------------------- Lower Extremity Assessment Details Patient Name: Date of Service: [name], Treyvion B. 07/15/2022 1:15 PM Medical Record Number: [id number] Patient Account Number: [id number] Date of Birth/Sex: Treating RN: 1965/09/21 (57 y.o. [name] Primary Care Chamya Hunton: Pearson Grippe Other Clinician: Referring Deakin Lacek: Treating Siarah Deleo/Extender: Cleotis Lema in Treatment: 39 Electronic Signature(s) Signed: 07/15/2022 1:51:33 PM By: Midge Aver MSN RN CNS WTA Entered By: Midge Aver on 07/15/2022 13:51:33 -------------------------------------------------------------------------------- Multi Wound Chart Details Patient Name: Date of Service: [name], Colin B. 07/15/2022 1:15 PM Medical Record Number: [id number] Patient Account Number: [id number] Date of Birth/Sex: Treating RN: 1966-02-19 (57 y.o. [name] Primary Care Eran Windish: Pearson Grippe Other Clinician: Referring Yuri Flener: Treating Treyten Monestime/Extender: Cleotis Lema in Treatment: 39 Vital Signs Height(in): 73 Pulse(bpm): 110 Weight(lbs): 154 Blood Pressure(mmHg): 106/90 Body Mass Index(BMI): 20.3 Temperature(F): 98.4 Respiratory Rate(breaths/min): 18 [Colin:Photos:] [N/A:N/A] Left Gluteus N/A N/A Wound Location: Pressure Injury N/A N/A Wounding Event: MCKINNLEY, KITAGAWA ([id number]) [id number] Page 4 of Colin Pressure Ulcer N/A N/A Primary Etiology: Hypotension,  Myocardial Infarction, N/A N/A Comorbid History: History of pressure wounds, Rheumatoid Arthritis, Paraplegia, Confinement Anxiety 09/05/2021 N/A N/A Date Acquired: 91 N/A N/A Weeks of Treatment: Open N/A N/A Wound Status: No N/A N/A Wound Recurrence: 0.2x0.2x0.3 N/A N/A Measurements L x W x D (cm) 0.031 N/A N/A A (cm) : rea 0.009 N/A N/A Volume (cm) : 99.40% N/A N/A % Reduction in A rea: 99.80% N/A N/A % Reduction in Volume: Category/Stage III N/A N/A Classification: Medium N/A N/A Exudate A mount:  Serosanguineous N/A N/A Exudate Type: red, brown N/A N/A Exudate Color: Large (67-100%) N/A N/A Granulation A mount: Red N/A N/A Granulation Quality: Small (1-33%) N/A N/A Necrotic A mount: Fat Layer (Subcutaneous Tissue): Yes N/A N/A Exposed Structures: None N/A N/A Epithelialization: Treatment Notes Wound #Colin (Gluteus) Wound Laterality: Left Cleanser Soap and Water Discharge Instruction: Gently cleanse wound with antibacterial soap, rinse and pat dry prior to dressing wounds Peri-Wound Care Skin Prep Discharge Instruction: Use skin prep as directed Topical Primary Dressing Prisma 4.34 (in) Discharge Instruction: Moisten w/normal saline or sterile water; Cover wound as directed. Do not remove from wound bed. Secondary Dressing (BORDER) Zetuvit Plus SILICONE BORDER Dressing 5x5 (in/in) Discharge Instruction: Please do not put silicone bordered dressings under wraps. Use non-bordered dressing only. Secured With Compression Wrap Compression Stockings Facilities manager) Signed: 07/15/2022 1:51:48 PM By: Midge Aver MSN RN CNS WTA Entered By: Midge Aver on 07/15/2022 13:51:48 -------------------------------------------------------------------------------- Multi-Disciplinary Care Plan Details Patient Name: Date of Service: [name], Colin B. 07/15/2022 1:15 PM Medical Record Number: [id number] Patient Account Number: [id number] Date of  Birth/Sex: Treating RN: 10/30/1965 (57 y.o. [name] Primary Care Toneisha Savary: Pearson Grippe Other Clinician: Referring Marcianne Ozbun: Treating Tatum Massman/Extender: Cleotis Lema in Treatment: 39 Active Inactive Pressure Nursing Diagnoses: JAYQUON, THEILER ([id number]) [phone].pdf Page 5 of Colin Knowledge deficit related to causes and risk factors for pressure ulcer development Knowledge deficit related to management of pressures ulcers Potential for impaired tissue integrity related to pressure, friction, moisture, and shear Goals: Patient will remain free from development of additional pressure ulcers Date Initiated: Colin/29/2023 Date Inactivated: 07/15/2022 Target Resolution Date: Colin/29/2023 Goal Status: Met Patient/caregiver will verbalize risk factors for pressure ulcer development Date Initiated: Colin/29/2023 Target Resolution Date: 09/05/2022 Goal Status: Active Patient/caregiver will verbalize understanding of pressure ulcer management Date Initiated: Colin/29/2023 Target Resolution Date: 09/05/2022 Goal Status: Active Interventions: Assess: immobility, friction, shearing, incontinence upon admission and as needed Assess offloading mechanisms upon admission and as needed Assess potential for pressure ulcer upon admission and as needed Provide education on pressure ulcers Notes: Wound/Skin Impairment Nursing Diagnoses: Impaired tissue integrity Knowledge deficit related to ulceration/compromised skin integrity Goals: Ulcer/skin breakdown will have a volume reduction of 30% by week 4 Date Initiated: 10/15/2021 Target Resolution Date: Colin/Colin/2023 Goal Status: Active Ulcer/skin breakdown will have a volume reduction of 50% by week Colin Date Initiated: 10/15/2021 Target Resolution Date: 12/10/2021 Goal Status: Active Ulcer/skin breakdown will have a volume reduction of 80% by week 12 Date Initiated: 10/15/2021 Target Resolution Date: 01/07/2022 Goal  Status: Active Ulcer/skin breakdown will heal within 14 weeks Date Initiated: 10/15/2021 Target Resolution Date: 01/21/2022 Goal Status: Active Interventions: Assess patient/caregiver ability to obtain necessary supplies Assess patient/caregiver ability to perform ulcer/skin care regimen upon admission and as needed Assess ulceration(s) every visit Provide education on ulcer and skin care Notes: Electronic Signature(s) Signed: 07/15/2022 5:03:29 PM By: Midge Aver MSN RN CNS WTA Entered By: Midge Aver on 07/15/2022 13:41:43 -------------------------------------------------------------------------------- Pain Assessment Details Patient Name: Date of Service: [name], Taige B. 07/15/2022 1:15 PM Medical Record Number: [id number] Patient Account Number: [id number] Date of Birth/Sex: Treating RN: 06-03-1965 (57 y.o. [name] Primary Care Kahlea Cobert: Pearson Grippe Other Clinician: Referring Elsbeth Yearick: Treating Marshayla Mitschke/Extender: Cleotis Lema in Treatment: 39 Active Problems Location of Pain Severity and Description of Pain Patient Has Paino No Site Locations [place], Colin B ([id number]) [id number] Page 6 of Colin Pain Management and Medication Current Pain Management: Electronic Signature(s) Signed: 07/15/2022  1:51:24 PM By: Midge AverSmith, Vicki MSN RN CNS WTA Entered By: Midge AverSmith, Vicki on 07/15/2022 13:51:24 -------------------------------------------------------------------------------- Patient/Caregiver Education Details Patient Name: Date of Service: [place] TL[place] N, Corrin B. 4/9/2024andnbsp1:15 PM Medical Record Number: [id number] Patient Account Number: [id number] Date of Birth/Gender: Treating RN: 1965/08/31 (57 y.o. Colin CluckM) Smith, Vicki Primary Care Physician: Pearson GrippeKim, James Other Clinician: Referring Physician: Treating Physician/Extender: Cleotis LemaStone, Hoyt Kim, James Weeks in Treatment: 39 Education Assessment Education Provided  To: Patient Education Topics Provided Wound/Skin Impairment: Handouts: Caring for Your Ulcer Methods: Explain/Verbal Responses: State content correctly Electronic Signature(s) Signed: 07/15/2022 5:03:29 PM By: Midge AverSmith, Vicki MSN RN CNS WTA Entered By: Midge AverSmith, Vicki on 07/15/2022 13:41:54 -------------------------------------------------------------------------------- Wound Assessment Details Patient Name: Date of Service: Colin [place] TL[place] N, Secundino B. 07/15/2022 1:15 PM Medical Record Number: [id number] Patient Account Number: [id number] Date of Birth/Sex: Treating RN: 1965/08/31 (57 y.o. Colin CluckM) Smith, Vicki Primary Care Reeanna Acri: Pearson GrippeKim, James Other Clinician: Referring Tauri Ethington: Treating Solenne Manwarren/Extender: Cleotis LemaStone, Hoyt Kim, James Weeks in Treatment: 39 Wound Status Wound Number: Colin Primary Pressure Ulcer Etiology: Wound Location: Left Gluteus Wound Open Wounding Event: Pressure Injury Status: Date Acquired: 09/05/2021 Comorbid Hypotension, Myocardial Infarction, History of pressure wounds, Weeks Of Treatment: [address] Steege, Jaire B ([id number]) [id number] Page 7 of Colin Weeks Of Treatment: 39 History: Rheumatoid Arthritis, Paraplegia, Confinement Anxiety Clustered Wound: No Photos Wound Measurements Length: (cm) 0.2 Width: (cm) 0.2 Depth: (cm) 0.3 Area: (cm) 0.031 Volume: (cm) 0.009 % Reduction in Area: 99.4% % Reduction in Volume: 99.Colin% Epithelialization: None Wound Description Classification: Category/Stage III Exudate Amount: Medium Exudate Type: Serosanguineous Exudate Color: red, brown Foul Odor After Cleansing: No Slough/Fibrino No Wound Bed Granulation Amount: Large (67-100%) Exposed Structure Granulation Quality: Red Fat Layer (Subcutaneous Tissue) Exposed: Yes Necrotic Amount: Small (1-33%) Treatment Notes Wound #Colin (Gluteus) Wound Laterality: Left Cleanser Soap and Water Discharge Instruction: Gently cleanse wound with antibacterial soap,  rinse and pat dry prior to dressing wounds Peri-Wound Care Skin Prep Discharge Instruction: Use skin prep as directed Topical Primary Dressing Hydrofera Blue Ready Transfer Foam, 2.5x2.5 (in/in) Discharge Instruction: Apply Hydrofera Blue Ready to wound bed as directed Secondary Dressing (BORDER) Zetuvit Plus SILICONE BORDER Dressing 5x5 (in/in) Discharge Instruction: Please do not put silicone bordered dressings under wraps. Use non-bordered dressing only. Secured With Compression Wrap Compression Stockings Facilities managerAdd-Ons Electronic Signature(s) Signed: 07/15/2022 5:03:29 PM By: Midge AverSmith, Vicki MSN RN CNS WTA Entered By: Midge AverSmith, Vicki on 07/15/2022 13:39:30 -------------------------------------------------------------------------------- Vitals Details Patient Name: Date of Service: Colin [place] TLINGTO N, Colin B. 07/15/2022 1:15 PM Merlinda FrederickWATLINGTON, Colin B ([name]) [id number] Page Colin of Colin Medical Record Number: [id number] Patient Account Number: [id number] Date of Birth/Sex: Treating RN: 1965/08/31 (57 y.o. Colin CluckM) Smith, Vicki Primary Care Darris Carachure: Pearson GrippeKim, James Other Clinician: Referring Sidrah Harden: Treating Anay Rathe/Extender: Cleotis LemaStone, Hoyt Kim, James Weeks in Treatment: 39 Vital Signs Time Taken: 13:25 Temperature (F): 98.4 Height (in): 73 Pulse (bpm): 110 Weight (lbs): 154 Respiratory Rate (breaths/min): 18 Body Mass Index (BMI): 20.3 Blood Pressure (mmHg): 106/90 Reference Range: 80 - 120 mg / dl Electronic Signature(s) Signed: 07/15/2022 1:51:20 PM By: Midge AverSmith, Vicki MSN RN CNS WTA Entered By: Midge AverSmith, Vicki on 07/15/2022 13:51:20

## 2022-07-29 ENCOUNTER — Encounter: Payer: Medicare Other | Admitting: Physician Assistant

## 2022-07-29 DIAGNOSIS — L89153 Pressure ulcer of sacral region, stage 3: Secondary | ICD-10-CM | POA: Diagnosis not present

## 2022-07-29 NOTE — Progress Notes (Signed)
[name], [name] ([id number]) [id number] Page 1 of 7 Visit Report for 07/29/2022 Chief Complaint Document Details Patient Name: Date of Service: [name], [name] 07/29/2022 1:00 PM Medical Record Number: [id number] Patient Account Number: [id number] Date of Birth/Sex: Treating RN: 07/23/1965 (57 y.o. [name] Primary Care Provider: Pearson Grippe Other Clinician: Referring Provider: Treating Provider/Extender: Cleotis Lema in Treatment: 41 Information Obtained from: Patient Chief Complaint Sacral and back pressure ulcers Electronic Signature(s) Signed: 07/29/2022 1:39:17 PM By: Allen Derry PA-C Entered By: Allen Derry on 07/29/2022 13:39:17 -------------------------------------------------------------------------------- HPI Details Patient Name: Date of Service: [name], [name] 07/29/2022 1:00 PM Medical Record Number: [id number] Patient Account Number: [id number] Date of Birth/Sex: Treating RN: [birth year] (57 y.o. [name] Primary Care Provider: Pearson Grippe Other Clinician: Referring Provider: Treating Provider/Extender: Cleotis Lema in Treatment: 41 History of Present Illness ssociated Signs and Symptoms: Patient has a history of confirmed osteomyelitis of the left foot according to MRI July 2019. 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 HPI Description: 12/08/17 on evaluation today patient presents for initial evaluation and our clinic concerning issues he has been having with his feet bilaterally although the left more than right most recently. He was admitted to hospital where he was placed on IV vancomycin which actually he continue to utilize until discharge and even when discharged continue to be on until around 16 August according to what he tells me. His PICC line was removed at that point. Nonetheless he has been  seen in the wound care center in [facility] before transferring to [place] due to the fact that the doctor there left and they are no longer open. Nonetheless he does have significant osteomyelitis of the left foot noted on MRI which revealed significant issues with necrotic bone including a portion of the distal region of his left great toe which was felt to possibly either be missing as a result of amputation or potentially resorption. Either way he also had osteomyelitis noted of the digit, metatarsal region, cuneiform, and navicular bones. There is definite bone exposure noted externally at this point in time. In regard to the right foot he has issues with ulcerations here as well although he states that recently he's had no issues until this just blistered and reopened. Apparently Xeroform was used in this has caused things to be somewhat more moist and open more according to the patient. He's otherwise been tolerating the dressing changes without complication using silver alginate dressings. He has no discomfort but does seem to be extremely stressed regarding the fact that he did see Dr. 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 The patient stated per notes reviewed that he did not want to have any surgery and therefore has a repeat appointment scheduled with the surgeon on 12/15/17. With all that being said he feels like that he really has not been alerted to what was going on in the severity that was until just recently and has a lot of questions in that regard. I'll be see I explained that seeing him for the first time today I cannot answer a lot of those questions that he has. 12/21/17 on evaluation today patient actually appears to be doing a little better in regard to the right foot in particular. There is one area where there still definitive bone noted although the MRI which I did review  today that we had ordered was negative for any evidence of  osteomyelitis which is good news. Nonetheless there was some necrotic bone noted. Overall the patient appears to be doing fairly well however in regard to the right foot. Unfortunately he does have a new open area on the posterior aspect of his left leg as well as the continued area of necrosis noted in the central portion of the foot. He states that he's actually going to be sent for reevaluation specifically to a limb salvage clinic at [facility] he believes this is something that is primary care provider has been working with. They were just awaiting the results of the MRI. 01/08/18 on evaluation today patient actually appears to be doing maybe just a little better in regard to the overall appearance of his bilateral foot ulcers. In general this does not seem to have worsened at least which is good news. He still has evidence of bone noted on the surface of both wound areas which though dry appearing really has not otherwise changed at this time. He actually has an appointment at [facility] with the wound center to discuss limb salvage. He discusses with me at the last visit. Nonetheless he was somewhat upset today about a couple things one being that he stated that we did not put the order in for the protective dressing for his knees that we discussed last time. With that being said I had put in the order for the protective dressing in fact it was on his order sheet that was signed on 12/21/17. Nonetheless unfortunately it appears this was overlooked by home health and they told him if they had the order they could put the protective dressing on. Nonetheless they told him they did not have the order. Again I believe this was an oversight nothing intentional on anyone's part nonetheless the order was there. Still I had explained to the patient sometimes insurance will not cover for the protective dressing even if it is ordered and this was discussed in the last visit. 01/25/18 on evaluation today  patient presents for follow-up concerning his bilateral lower extremity ulcers. He has actually been doing about the same since I last saw him. We do have them in the room we can look at his gluteal region today as well to ensure that there is nothing open or if so address the issue there. Nonetheless he was supposed to see you and see wound care/limb salvage prior to his visit with me today. Unfortunately when he arrived last time at their clinic he apparently was there on the wrong day the doctor was not even present that he was his to be seen. Nonetheless I had to be switched to a different day and the patient actually is going in the next week to see them. Assuming everything works out this should actually be tomorrow. Nonetheless if they take over care of that point then we will subsequently release care to them. Readmission: ALEXI, GEIBEL ([id number]) [id number] Page 2 of 7 10-15-2021 upon evaluation today patient appears to be doing somewhat poorly in regard to wounds over the left gluteal region. He also has areas in the midline sacrum although this is not quite as bad. There is however some evidence of necrotic bone noted at this point unfortunately. The question is whether this is just something working its way out or if it something that is actually actively infected at this point. We are going to have to evaluate that further little by  little as we go with additional diagnostics. With that being said the patient tells me currently that what we are seeing him for has been open since around 09-05-2021 for the gluteal area on the left and in regard to the sacrum 10-01-2021 he states. With that being said he again is not too significant as far as the overall appearance of the wounds are concerned but again with the bone exposed this is something that we may want to do at pathology and culture in regard to. He has previously been treated for osteomyelitis of the  sacral area. 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 This is a gentleman whom I have previously seen back in 2019. The wound that we were taking care of back at that time is completely cleared. 7/25; 2-week follow-up. The patient has 3 small open areas in the lower sacrum and then an area over the left ischial tuberosity. Both of these are stage III wounds. He has been using Hydrofera Blue. 11-12-2021 upon evaluation today patient appears to be doing well currently in regard to his wounds. The distal sacral area is almost completely closed and looks to be doing great. In regard to the left gluteal region this is showing signs of improvement from a depth perspective size is about the same. Overall I am extremely pleased with where things stand compared to what we were previous. 12-03-2021 upon evaluation today patient appears to be doing well currently in regard to his wound. He is actually showing signs of improvement the sacral region and the right ischial/gluteal region is completely healed. The left ischial/gluteal region is still open but seems to be doing much better. 12-24-2021 upon evaluation today patient appears to be doing well currently in regard to some of his wounds others have reopened or are not doing quite as well. Overall though I think that he is on a good track he just may be needs to be a little bit better on appropriate offloading we discussed that today as well. 01-27-2022 upon evaluation today patient appears to be doing well currently in regard to his wound. He is not showing any signs of significant tissue breakdown but unfortunately the wound is larger than what it was last time I saw him. He tells me that this seems to be a complete shock based on what he is hearing today. He tells me that his son was telling him this has been doing well and that it was smaller and not really bleeding or draining much. Again this is  definitely significantly larger than last time I saw him but does not appear to be obviously infected there is some undermining noted as well. In general I am unsure of what would have caused such a dramatic changes what he seems to feel has happened here but nonetheless indeed I do not feel like that this is doing nearly as well as last time I saw him. 11/6; his wound measured better today per our intake nurse using silver alginate and border foam although according to the patient were having trouble with supplies especially the border foam. He has home health. 02-24-2022 upon evaluation today patient appears to be doing better in regard to his wound. The sacral area is looking like it is trying to break down a little bit is not actually open right now but we definitely need to be careful in this regard. I discussed that with him today. With that being said the patient  tells me that he definitely is not changing positions as frequently as he should and I think that is the primary cause of what we are seeing here. Fortunately there does not appear to be any signs of infection locally or systemically at this time. No fevers, chills, nausea, vomiting, or diarrhea. 03-11-2022 upon evaluation today patient is actually making excellent improvement in general. I am extremely pleased with where things stand I do believe that he is headed on the right direction here. 03-25-2022 upon evaluation today patient appears to be doing well currently in regard to his wounds for the most part although the sacral area appears to be a little bit more broken down than what we would have expected. Fortunately there does not appear to be any signs of infection he tells me that he is not exactly sure what happened that caused this to breakdown to the degree that it is. Fortunately there does not appear to be any signs of active infection locally nor systemically at this time which is great news. No fevers, chills, nausea,  vomiting, or diarrhea. 04-22-2022 upon evaluation today patient's wound actually appears to be doing well on the left and the gluteal fold location. With that being said in regard to the sacral area this is open and a little bit more slough covered at this point. Fortunately I do not see any signs of infection locally nor systemically which is great news and overall I am extremely pleased with where things stand today. Overall I am very happy with where we are and I do think that he can get the sacral area he will get again but we need to keep a close eye on everything for sure. He does tell me he is good to be more aggressive with appropriate offloading which I think is definitely a good way to go. 05-06-2022 upon evaluation today patient appears to be doing well currently in regard to his wound on the sacral area though it is a little bit hyper granulated at this point unfortunately. Otherwise this seems to be doing overall decently well. In general I think that we are headed in the right direction. In regards to the wound in the left gluteal region this does have a little bit of depth to it but still seems to be showing signs of improvement which is great news and I am pleased in that regard. 05-20-2022 upon evaluation today patient appears to be doing better in regard to his wounds although he tells me he has been having to make to as he did not have the supplies ordered that he needed for dressing changes. Fortunately there does not appear to be any signs of active infection locally nor systemically which is great news. No fevers, chills, nausea, vomiting, or diarrhea. 06-03-2022 upon evaluation today patient actually appears to be doing excellent in fact his sacral wound is completely healed after the silver nitrate 2 weeks ago. Fortunately I do not see any signs of active infection locally nor systemically at this time. 06-24-2022 upon evaluation today patient actually appears to be making excellent  progress in regard to his wounds. He just has 1 left on the left hip/trochanter location and this 1 remaining spot is very tiny just a pinpoint. 07-15-2022 upon evaluation today patient appears to be doing well with regard to his wounds. In fact the wound remaining is almost completely closed and is showing signs of significant improvement. I am very pleased in that regard. There is no signs of active  infection at this point. 07-29-2022 upon evaluation today patient appears to be doing well currently in regard to his wound. He has been tolerating the dressing changes without complication. Fortunately there does not appear to be any signs of active infection which is great news and overall I am extremely pleased with where things are currently. Electronic Signature(s) Signed: 07/29/2022 1:47:08 PM By: Allen Derry PA-C Entered By: Allen Derry on 07/29/2022 13:47:08 -------------------------------------------------------------------------------- Physical Exam Details Patient Name: Date of Service: DUWANE, GEWIRTZ 07/29/2022 1:00 PM Medical Record Number: [id number] Patient Account Number: [id number] Date of Birth/Sex: Treating RN: 1965/08/26 (57 y.o. [name] Primary Care Provider: Pearson Grippe Other Clinician: Referring Provider: Treating Provider/Extender: Lia Foyer [place], Athanasius B ([id number]) [phone].pdf Page 3 of 7 Weeks in Treatment: 75 Constitutional Well-nourished and well-hydrated in no acute distress. Respiratory normal breathing without difficulty. Psychiatric this patient is able to make decisions and demonstrates good insight into disease process. Alert and Oriented x 3. pleasant and cooperative. Notes Upon inspection patient's wound bed showed signs of good granulation epithelization at this point. Fortunately I do not see any evidence of active infection he is almost completely healed the left gluteal region just has a  very small opening at this point and again this is a dramatic improvement even since the last time I saw him. I think we are almost done completely. Electronic Signature(s) Signed: 07/29/2022 1:47:30 PM By: Allen Derry PA-C Entered By: Allen Derry on 07/29/2022 13:47:30 -------------------------------------------------------------------------------- Physician Orders Details Patient Name: Date of Service: [name], [name] 07/29/2022 1:00 PM Medical Record Number: [id number] Patient Account Number: [id number] Date of Birth/Sex: Treating RN: 1966/02/25 (57 y.o. [name] Primary Care Provider: Pearson Grippe Other Clinician: Referring Provider: Treating Provider/Extender: Cleotis Lema in Treatment: 66 Verbal / Phone Orders: No Diagnosis Coding ICD-10 Coding Code Description L89.153 Pressure ulcer of sacral region, stage 3 L89.893 Pressure ulcer of other site, stage 3 G82.54 Quadriplegia, C5-C7 incomplete Z93.1 Gastrostomy status Follow-up Appointments Return Appointment in 2 weeks. Nurse Visit as needed Home Health Home Health Company: - [facility] for wound care. May utilize formulary equivalent dressing for wound treatment orders unless otherwise specified. Home Health Nurse may visit PRN to address patients wound care needs. Scheduled days for dressing changes to be completed; exception, patient has scheduled wound care visit that day. **Please direct any NON-WOUND related issues/requests for orders to patient's Primary Care Physician. **If current dressing causes regression in wound condition, may D/C ordered dressing product/s and apply Normal Saline Moist Dressing daily until next Wound Healing Center or Other MD appointment. **Notify Wound Healing Center of regression in wound condition at [phone]. Bathing/ [employer] wounds with antibacterial soap and water. May shower; gently cleanse wound with antibacterial soap,  rinse and pat dry prior to dressing wounds No tub bath. Off-Loading Turn and reposition every 2 hours Other: - relive pressure from heels on bed Wound Treatment Wound #8 - Gluteus Wound Laterality: Left Cleanser: Soap and Water (Generic) 3 x Per Week/30 Days Discharge Instructions: Gently cleanse wound with antibacterial soap, rinse and pat dry prior to dressing wounds Peri-Wound Care: Skin Prep 3 x Per Week/30 Days Discharge Instructions: Use skin prep as directed Prim Dressing: Hydrofera Blue Ready Transfer Foam, 2.5x2.5 (in/in) 3 x Per Week/30 Days ary Discharge Instructions: Apply Hydrofera Blue Ready to wound bed as directed Ryen, Rhames Yacqub B ([id number]) [id number] Page 4 of 7 Secondary Dressing: (BORDER) Zetuvit Plus SILICONE  BORDER Dressing 5x5 (in/in) (Home Health) (Dispense As Written) 3 x Per Week/30 Days Discharge Instructions: Please do not put silicone bordered dressings under wraps. Use non-bordered dressing only. Electronic Signature(s) Signed: 07/29/2022 4:43:31 PM By: Angelina Pih Signed: 07/29/2022 4:59:06 PM By: Allen Derry PA-C Entered By: Angelina Pih on 07/29/2022 14:04:13 -------------------------------------------------------------------------------- Problem List Details Patient Name: Date of Service: [name], [name] 07/29/2022 1:00 PM Medical Record Number: [id number] Patient Account Number: [id number] Date of Birth/Sex: Treating RN: 07-13-1965 (57 y.o. [name] Primary Care Provider: Pearson Grippe Other Clinician: Referring Provider: Treating Provider/Extender: Cleotis Lema in Treatment: 78 Active Problems ICD-10 Encounter Code Description Active Date MDM Diagnosis L89.153 Pressure ulcer of sacral region, stage 3 10/15/2021 No Yes L89.893 Pressure ulcer of other site, stage 3 10/15/2021 No Yes G82.54 Quadriplegia, C5-C7 incomplete 10/15/2021 No Yes Z93.1 Gastrostomy status 10/15/2021 No  Yes Inactive Problems Resolved Problems Electronic Signature(s) Signed: 07/29/2022 1:39:14 PM By: Allen Derry PA-C Entered By: Allen Derry on 07/29/2022 13:39:14 -------------------------------------------------------------------------------- Progress Note Details Patient Name: Date of Service: [name], Damarion B. 07/29/2022 1:00 PM Medical Record Number: [id number] Patient Account Number: [id number] Date of Birth/Sex: Treating RN: [birth year] (57 y.o. [name] Primary Care Provider: Pearson Grippe Other Clinician: Referring Provider: Treating Provider/Extender: Cleotis Lema in Treatment: 41 Subjective Chief Complaint Information obtained from Patient Sacral and back pressure ulcers History of Present Illness (HPI) The following HPI elements were documented for the patient's wound: Associated Signs and Symptoms: Patient has a history of confirmed osteomyelitis of the left foot according to MRI July 2019. He also has quadriplegia due to TRAYSON, STITELY B ([id number]) [id number] Page 5 of 7 a cervical fracture, he is underweight, and has protein calorie malnutrition. 12/08/17 on evaluation today patient presents for initial evaluation and our clinic concerning issues he has been having with his feet bilaterally although the left more than right most recently. He was admitted to hospital where he was placed on IV vancomycin which actually he continue to utilize until discharge and even when discharged continue to be on until around 16 August according to what he tells me. His PICC line was removed at that point. Nonetheless he has been seen in the wound care center in [facility] before transferring to [place] due to the fact that the doctor there left and they are no longer open. Nonetheless he does have significant osteomyelitis of the left foot noted on MRI which revealed significant issues with necrotic bone including a portion of  the distal region of his left great toe which was felt to possibly either be missing as a result of amputation or potentially resorption. Either way he also had osteomyelitis noted of the digit, metatarsal region, cuneiform, and navicular bones. There is definite bone exposure noted externally at this point in time. In regard to the right foot he has issues with ulcerations here as well although he states that recently he's had no issues until this just blistered and reopened. Apparently Xeroform was used in this has caused things to be somewhat more moist and open more according to the patient. He's otherwise been tolerating the dressing changes without complication using silver alginate dressings. He has no discomfort but does seem to be extremely stressed regarding the fact that he did see Dr. 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 The patient stated per notes reviewed that he did not want to have any surgery and therefore  has a repeat appointment scheduled with the surgeon on 12/15/17. With all that being said he feels like that he really has not been alerted to what was going on in the severity that was until just recently and has a lot of questions in that regard. I'll be see I explained that seeing him for the first time today I cannot answer a lot of those questions that he has. 12/21/17 on evaluation today patient actually appears to be doing a little better in regard to the right foot in particular. There is one area where there still definitive bone noted although the MRI which I did review today that we had ordered was negative for any evidence of osteomyelitis which is good news. Nonetheless there was some necrotic bone noted. Overall the patient appears to be doing fairly well however in regard to the right foot. Unfortunately he does have a new open area on the posterior aspect of his left leg as well as the continued area of necrosis noted in the central  portion of the foot. He states that he's actually going to be sent for reevaluation specifically to a limb salvage clinic at [facility] he believes this is something that is primary care provider has been working with. They were just awaiting the results of the MRI. 01/08/18 on evaluation today patient actually appears to be doing maybe just a little better in regard to the overall appearance of his bilateral foot ulcers. In general this does not seem to have worsened at least which is good news. He still has evidence of bone noted on the surface of both wound areas which though dry appearing really has not otherwise changed at this time. He actually has an appointment at [facility] with the wound center to discuss limb salvage. He discusses with me at the last visit. Nonetheless he was somewhat upset today about a couple things one being that he stated that we did not put the order in for the protective dressing for his knees that we discussed last time. With that being said I had put in the order for the protective dressing in fact it was on his order sheet that was signed on 12/21/17. Nonetheless unfortunately it appears this was overlooked by home health and they told him if they had the order they could put the protective dressing on. Nonetheless they told him they did not have the order. Again I believe this was an oversight nothing intentional on anyone's part nonetheless the order was there. Still I had explained to the patient sometimes insurance will not cover for the protective dressing even if it is ordered and this was discussed in the last visit. 01/25/18 on evaluation today patient presents for follow-up concerning his bilateral lower extremity ulcers. He has actually been doing about the same since I last saw him. We do have them in the room we can look at his gluteal region today as well to ensure that there is nothing open or if so address the issue there. Nonetheless he was supposed to  see you and see wound care/limb salvage prior to his visit with me today. Unfortunately when he arrived last time at their clinic he apparently was there on the wrong day the doctor was not even present that he was his to be seen. Nonetheless I had to be switched to a different day and the patient actually is going in the next week to see them. Assuming everything works out this should actually be tomorrow. Nonetheless  if they take over care of that point then we will subsequently release care to them. Readmission: 10-15-2021 upon evaluation today patient appears to be doing somewhat poorly in regard to wounds over the left gluteal region. He also has areas in the midline sacrum although this is not quite as bad. There is however some evidence of necrotic bone noted at this point unfortunately. The question is whether this is just something working its way out or if it something that is actually actively infected at this point. We are going to have to evaluate that further little by little as we go with additional diagnostics. With that being said the patient tells me currently that what we are seeing him for has been open since around 09-05-2021 for the gluteal area on the left and in regard to the sacrum 10-01-2021 he states. With that being said he again is not too significant as far as the overall appearance of the wounds are concerned but again with the bone exposed this is something that we may want to do at pathology and culture in regard to. 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 This is a gentleman whom I have previously seen back in 2019. The wound that we were taking care of back at that time is completely cleared. 7/25; 2-week follow-up. The patient has 3 small open areas in the lower sacrum and then an area over the left ischial tuberosity. Both of these are stage  III wounds. He has been using Hydrofera Blue. 11-12-2021 upon evaluation today patient appears to be doing well currently in regard to his wounds. The distal sacral area is almost completely closed and looks to be doing great. In regard to the left gluteal region this is showing signs of improvement from a depth perspective size is about the same. Overall I am extremely pleased with where things stand compared to what we were previous. 12-03-2021 upon evaluation today patient appears to be doing well currently in regard to his wound. He is actually showing signs of improvement the sacral region and the right ischial/gluteal region is completely healed. The left ischial/gluteal region is still open but seems to be doing much better. 12-24-2021 upon evaluation today patient appears to be doing well currently in regard to some of his wounds others have reopened or are not doing quite as well. Overall though I think that he is on a good track he just may be needs to be a little bit better on appropriate offloading we discussed that today as well. 01-27-2022 upon evaluation today patient appears to be doing well currently in regard to his wound. He is not showing any signs of significant tissue breakdown but unfortunately the wound is larger than what it was last time I saw him. He tells me that this seems to be a complete shock based on what he is hearing today. He tells me that his son was telling him this has been doing well and that it was smaller and not really bleeding or draining much. Again this is definitely significantly larger than last time I saw him but does not appear to be obviously infected there is some undermining noted as well. In general I am unsure of what would have caused such a dramatic changes what he seems to feel has happened here but nonetheless indeed I do not feel like that this is doing nearly as  well as last time I saw him. 11/6; his wound measured better today per our intake  nurse using silver alginate and border foam although according to the patient were having trouble with supplies especially the border foam. He has home health. 02-24-2022 upon evaluation today patient appears to be doing better in regard to his wound. The sacral area is looking like it is trying to break down a little bit is not actually open right now but we definitely need to be careful in this regard. I discussed that with him today. With that being said the patient tells me that he definitely is not changing positions as frequently as he should and I think that is the primary cause of what we are seeing here. Fortunately there does not appear to be any signs of infection locally or systemically at this time. No fevers, chills, nausea, vomiting, or diarrhea. 03-11-2022 upon evaluation today patient is actually making excellent improvement in general. I am extremely pleased with where things stand I do believe that he is headed on the right direction here. 03-25-2022 upon evaluation today patient appears to be doing well currently in regard to his wounds for the most part although the sacral area appears to be a little bit more broken down than what we would have expected. Fortunately there does not appear to be any signs of infection he tells me that he is not exactly sure what happened that caused this to breakdown to the degree that it is. Fortunately there does not appear to be any signs of active infection locally nor systemically at this time which is great news. No fevers, chills, nausea, vomiting, or diarrhea. 04-22-2022 upon evaluation today patient's wound actually appears to be doing well on the left and the gluteal fold location. With that being said in regard to the sacral area this is open and a little bit more slough covered at this point. Fortunately I do not see any signs of infection locally nor systemically which is great news and overall I am extremely pleased with where things  stand today. Overall I am very happy with where we are and I do think that he can get the sacral area he will get again but we need to keep a close eye on everything for sure. He does tell me he is good to be more aggressive with appropriate offloading QUIROA, Pavan B ([id number]) [id number] Page 6 of 7 which I think is definitely a good way to go. 05-06-2022 upon evaluation today patient appears to be doing well currently in regard to his wound on the sacral area though it is a little bit hyper granulated at this point unfortunately. Otherwise this seems to be doing overall decently well. In general I think that we are headed in the right direction. In regards to the wound in the left gluteal region this does have a little bit of depth to it but still seems to be showing signs of improvement which is great news and I am pleased in that regard. 05-20-2022 upon evaluation today patient appears to be doing better in regard to his wounds although he tells me he has been having to make to as he did not have the supplies ordered that he needed for dressing changes. Fortunately there does not appear to be any signs of active infection locally nor systemically which is great news. No fevers, chills, nausea, vomiting, or diarrhea. 06-03-2022 upon evaluation today patient actually appears to be doing excellent in fact his sacral wound  is completely healed after the silver nitrate 2 weeks ago. Fortunately I do not see any signs of active infection locally nor systemically at this time. 06-24-2022 upon evaluation today patient actually appears to be making excellent progress in regard to his wounds. He just has 1 left on the left hip/trochanter location and this 1 remaining spot is very tiny just a pinpoint. 07-15-2022 upon evaluation today patient appears to be doing well with regard to his wounds. In fact the wound remaining is almost completely closed and is showing signs of  significant improvement. I am very pleased in that regard. There is no signs of active infection at this point. 07-29-2022 upon evaluation today patient appears to be doing well currently in regard to his wound. He has been tolerating the dressing changes without complication. Fortunately there does not appear to be any signs of active infection which is great news and overall I am extremely pleased with where things are currently. Objective Constitutional Well-nourished and well-hydrated in no acute distress. Vitals Time Taken: 1:02 PM, Height: 73 in, Weight: 154 lbs, BMI: 20.3, Temperature: 98.20 F, Pulse: 80 bpm, Respiratory Rate: 18 breaths/min, Blood Pressure: 143/100 mmHg. Respiratory normal breathing without difficulty. Psychiatric this patient is able to make decisions and demonstrates good insight into disease process. Alert and Oriented x 3. pleasant and cooperative. General Notes: Upon inspection patient's wound bed showed signs of good granulation epithelization at this point. Fortunately I do not see any evidence of active infection he is almost completely healed the left gluteal region just has a very small opening at this point and again this is a dramatic improvement even since the last time I saw him. I think we are almost done completely. Integumentary (Hair, Skin) Wound #8 status is Open. Original cause of wound was Pressure Injury. The date acquired was: 09/05/2021. The wound has been in treatment 41 weeks. The wound is located on the Left Gluteus. The wound measures 0.3cm length x 0.2cm width x 0.1cm depth; 0.047cm^2 area and 0.005cm^3 volume. There is Fat Layer (Subcutaneous Tissue) exposed. There is no tunneling or undermining noted. There is a medium amount of serosanguineous drainage noted. There is large (67-100%) pink granulation within the wound bed. There is a small (1-33%) amount of necrotic tissue within the wound bed. Assessment Active Problems ICD-10 Pressure  ulcer of sacral region, stage 3 Pressure ulcer of other site, stage 3 Quadriplegia, C5-C7 incomplete Gastrostomy status Plan 1. I would recommend that we have the patient continue to monitor for any signs of infection or worsening. Based on what I am seeing I do believe that we are making good progress. 2. I am also going to remember that we have the patient continue to offload he is having some trouble with his air mattress he is called the company they have not been yet to figure this out. Nonetheless I think that he needs to stay on top of them until they get to check out the air mattress with about an hour he is definitely at risk for this worsening. 3. Will continue with the [facility] for the time being this seems to be doing well. We will see patient back for reevaluation in 1 week here in the clinic. If anything worsens or changes patient will contact our office for additional recommendations. NEAMIAH, SCIARRA B ([id number]) [id number] Page 7 of 7 Electronic Signature(s) Signed: 07/29/2022 1:56:28 PM By: Allen Derry PA-C Entered By: Allen Derry on 07/29/2022 13:56:27 -------------------------------------------------------------------------------- SuperBill Details Patient Name: Date of  Service: BAKER, MORONTA 07/29/2022 Medical Record Number: [id number] Patient Account Number: [id number] Date of Birth/Sex: Treating RN: [birth year] (57 y.o. [name] Primary Care Provider: Pearson Grippe Other Clinician: Referring Provider: Treating Provider/Extender: Cleotis Lema in Treatment: 41 Diagnosis Coding ICD-10 Codes Code Description [phone] Pressure ulcer of sacral region, stage 3 L89.893 Pressure ulcer of other site, stage 3 G82.54 Quadriplegia, C5-C7 incomplete Z93.1 Gastrostomy status Facility Procedures : CPT4 Code: [id number] Description: 99213 - WOUND CARE VISIT-LEV 3 EST PT Modifier: Quantity: 1 Physician  Procedures : CPT4 Code Description Modifier [id number] 99213 - WC PHYS LEVEL 3 - EST PT ICD-10 Diagnosis Description L89.153 Pressure ulcer of sacral region, stage 3 L89.893 Pressure ulcer of other site, stage 3 G82.54 Quadriplegia, C5-C7 incomplete Z93.1 Gastrostomy  status Quantity: 1 Electronic Signature(s) Signed: 07/29/2022 1:56:57 PM By: Allen Derry PA-C Entered By: Allen Derry on 07/29/2022 13:56:57

## 2022-07-30 NOTE — Progress Notes (Signed)
CRIMSON, BEER B ([id number]) [id number] Page 1 of 8 Visit Report for 07/29/2022 Arrival Information Details Patient Name: Date of Service: [name], [name] 07/29/2022 1:00 PM Medical Record Number: [id number] Patient Account Number: [id number] Date of Birth/Sex: Treating RN: [birth year] (57 y.o. Laymond Purser Primary Care Anaise Sterbenz: Pearson Grippe Other Clinician: Referring Beverly Ferner: Treating Olegario Emberson/Extender: Cleotis Lema in Treatment: 56 Visit Information History Since Last Visit Added or deleted any medications: No Patient Arrived: Wheel Chair Any new allergies or adverse reactions: No Arrival Time: 13:02 Had a fall or experienced change in No Accompanied By: self activities of daily living that may affect Transfer Assistance: Michiel Sites Lift risk of falls: Patient Identification Verified: Yes Hospitalized since last visit: No Secondary Verification Process Completed: Yes Has Dressing in Place as Prescribed: Yes Patient Requires Transmission-Based Precautions: No Pain Present Now: Yes Patient Has Alerts: No Electronic Signature(s) Signed: 07/29/2022 4:43:31 PM By: Angelina Pih Entered By: Angelina Pih on 07/29/2022 13:02:43 -------------------------------------------------------------------------------- Clinic Level of Care Assessment Details Patient Name: Date of Service: [name] 07/29/2022 1:00 PM Medical Record Number: [id number] Patient Account Number: [id number] Date of Birth/Sex: Treating RN: [birth year] (57 y.o. Laymond Purser Primary Care Damiana Berrian: Pearson Grippe Other Clinician: Referring Dasha Kawabata: Treating Lavonta Tillis/Extender: Cleotis Lema in Treatment: 41 Clinic Level of Care Assessment Items TOOL 4 Quantity Score  - 0 Use when only an EandM is performed on FOLLOW-UP visit ASSESSMENTS - Nursing Assessment / Reassessment X- 1 10 Reassessment of Co-morbidities (includes  updates in patient status) X- 1 5 Reassessment of Adherence to Treatment Plan ASSESSMENTS - Wound and Skin A ssessment / Reassessment X - Simple Wound Assessment / Reassessment - one wound 1 5  - 0 Complex Wound Assessment / Reassessment - multiple wounds  - 0 Dermatologic / Skin Assessment (not related to wound area) ASSESSMENTS - Focused Assessment  - 0 Circumferential Edema Measurements - multi extremities  - 0 Nutritional Assessment / Counseling / Intervention  - 0 Lower Extremity Assessment (monofilament, tuning fork, pulses)  - 0 Peripheral Arterial Disease Assessment (using hand held doppler) ASSESSMENTS - Ostomy and/or Continence Assessment and Care  - 0 Incontinence Assessment and Management  - 0 Ostomy Care Assessment and Management (repouching, etc.) PROCESS - Coordination of Care X - Simple Patient / Family Education for ongoing care 1 15  - 0 Complex (extensive) Patient / Family Education for ongoing care DEMECO, DUCKSWORTH B ([id number]) [id number] Page 2 of 8 X- 1 10 Staff obtains Consents, Records, T Results / Process Orders est  - 0 Staff telephones HHA, Nursing Homes / Clarify orders / etc  - 0 Routine Transfer to another Facility (non-emergent condition)  - 0 Routine Hospital Admission (non-emergent condition)  - 0 New Admissions / Manufacturing engineer / Ordering NPWT Apligraf, etc. ,  - 0 Emergency Hospital Admission (emergent condition) X- 1 10 Simple Discharge Coordination  - 0 Complex (extensive) Discharge Coordination PROCESS - Special Needs  - 0 Pediatric / Minor Patient Management  - 0 Isolation Patient Management  - 0 Hearing / Language / Visual special needs  - 0 Assessment of Community assistance (transportation, D/C planning, etc.)  - 0 Additional assistance / Altered mentation  - 0 Support Surface(s) Assessment (bed, cushion, seat, etc.) INTERVENTIONS -  Wound Cleansing / Measurement X - Simple Wound Cleansing - one wound 1 5  - 0 Complex Wound Cleansing - multiple wounds X- 1 5 Wound Imaging (photographs - any number of wounds)  -  0 Wound Tracing (instead of photographs) X- 1 5 Simple Wound Measurement - one wound []  - 0 Complex Wound Measurement - multiple wounds INTERVENTIONS - Wound Dressings X - Small Wound Dressing one or multiple wounds 1 10 []  - 0 Medium Wound Dressing one or multiple wounds []  - 0 Large Wound Dressing one or multiple wounds X- 1 5 Application of Medications - topical []  - 0 Application of Medications - injection INTERVENTIONS - Miscellaneous []  - 0 External ear exam []  - 0 Specimen Collection (cultures, biopsies, blood, body fluids, etc.) []  - 0 Specimen(s) / Culture(s) sent or taken to Lab for analysis X- 1 10 Patient Transfer (multiple staff / Michiel Sites Lift / Similar devices) []  - 0 Simple Staple / Suture removal (25 or less) []  - 0 Complex Staple / Suture removal (26 or more) []  - 0 Hypo / Hyperglycemic Management (close monitor of Blood Glucose) []  - 0 Ankle / Brachial Index (ABI) - do not check if billed separately X- 1 5 Vital Signs Has the patient been seen at the hospital within the last three years: Yes Total Score: 100 Level Of Care: New/Established - Level 3 Electronic Signature(s) Signed: 07/29/2022 4:43:31 PM By: Angelina Pih Entered By: Angelina Pih on 07/29/2022 13:56:30 Jac Canavan, Othal B ([id number]) [id number] Page 3 of 8 -------------------------------------------------------------------------------- Complex / Palliative Patient Assessment Details Patient Name: Date of Service: Colin Birkenhead, [name] 07/29/2022 1:00 PM Medical Record Number: [id number] Patient Account Number: [id number] Date of Birth/Sex: Treating RN: January 21, 1966 (57 y.o. Arthur Holms Primary Care Madalyne Husk: Pearson Grippe Other Clinician: Referring Maurissa Ambrose: Treating  Anari Evitt/Extender: Cleotis Lema in Treatment: 41 Complex Wound Management Criteria Patient has remarkable or complex co-morbidities requiring medications or treatments that extend wound healing times. Examples: Diabetes mellitus with chronic renal failure or end stage renal disease requiring dialysis Advanced or poorly controlled rheumatoid arthritis Diabetes mellitus and end stage chronic obstructive pulmonary disease Active cancer with current chemo- or radiation therapy Paraplegia Palliative Wound Management Criteria Care Approach Wound Care Plan: Complex Wound Management Electronic Signature(s) Signed: 07/30/2022 12:27:29 PM By: Elliot Gurney, BSN, RN, CWS, Kim RN, BSN Signed: 07/30/2022 5:23:06 PM By: Allen Derry PA-C Entered By: Elliot Gurney, BSN, RN, CWS, Kim on 07/30/2022 12:27:29 -------------------------------------------------------------------------------- Encounter Discharge Information Details Patient Name: Date of Service: Colin Birkenhead, Ramez B. 07/29/2022 1:00 PM Medical Record Number: [id number] Patient Account Number: [id number] Date of Birth/Sex: Treating RN: [birth year] (57 y.o. Laymond Purser Primary Care Emer Onnen: Pearson Grippe Other Clinician: Referring Analy Bassford: Treating Ankur Snowdon/Extender: Cleotis Lema in Treatment: [phone] Encounter Discharge Information Items Discharge Condition: Stable Ambulatory Status: Wheelchair Discharge Destination: Home Transportation: Other Accompanied By: self Schedule Follow-up Appointment: Yes Clinical Summary of Care: Electronic Signature(s) Signed: 07/29/2022 4:43:31 PM By: Angelina Pih Entered By: Angelina Pih on 07/29/2022 14:04:38 -------------------------------------------------------------------------------- Lower Extremity Assessment Details Patient Name: Date of Service: CHRISTOPHERE, [place] 07/29/2022 1:00 PM Medical Record Number: [id number] Patient Account Number: [id number] Date of  Birth/Sex: Treating RN: 1965/12/25 (57 y.o. Laymond Purser Primary Care Maigan Bittinger: Pearson Grippe Other Clinician: Referring Marycatherine Maniscalco: Treating Vishaal Strollo/Extender: Cleotis Lema in Treatment: 41 Electronic Signature(s) Signed: 07/29/2022 4:43:31 PM By: Angelina Pih Entered By: Angelina Pih on 07/29/2022 13:18:15 [name] ([id number]) [id number] Page 4 of 8 -------------------------------------------------------------------------------- Multi Wound Chart Details Patient Name: Date of Service: Colin Birkenhead, [name] 07/29/2022 1:00 PM Medical Record Number: [id number] Patient Account Number: [id number] Date of Birth/Sex: Treating RN: [birth year] (57 y.o. Roselle Locus,  Caitlin Primary Care Island Dohmen: Pearson Grippe Other Clinician: Referring Martesha Niedermeier: Treating Oley Lahaie/Extender: Cleotis Lema in Treatment: 41 Vital Signs Height(in): 73 Pulse(bpm): 80 Weight(lbs): 154 Blood Pressure(mmHg): 143/100 Body Mass Index(BMI): 20.3 Temperature(F): 98.20 Respiratory Rate(breaths/min): 18 [8:Photos:] [N/A:N/A] Left Gluteus N/A N/A Wound Location: Pressure Injury N/A N/A Wounding Event: Pressure Ulcer N/A N/A Primary Etiology: Hypotension, Myocardial Infarction, N/A N/A Comorbid History: History of pressure wounds, Rheumatoid Arthritis, Paraplegia, Confinement Anxiety 09/05/2021 N/A N/A Date Acquired: 3 N/A N/A Weeks of Treatment: Open N/A N/A Wound Status: No N/A N/A Wound Recurrence: 0.3x0.2x0.1 N/A N/A Measurements L x W x D (cm) 0.047 N/A N/A A (cm) : rea 0.005 N/A N/A Volume (cm) : 99.10% N/A N/A % Reduction in A rea: 99.90% N/A N/A % Reduction in Volume: Category/Stage III N/A N/A Classification: Medium N/A N/A Exudate A mount: Serosanguineous N/A N/A Exudate Type: red, brown N/A N/A Exudate Color: Large (67-100%) N/A N/A Granulation A mount: Pink N/A N/A Granulation Quality: Small (1-33%)  N/A N/A Necrotic A mount: Fat Layer (Subcutaneous Tissue): Yes N/A N/A Exposed Structures: None N/A N/A Epithelialization: Treatment Notes Electronic Signature(s) Signed: 07/29/2022 4:43:31 PM By: Angelina Pih Entered By: Angelina Pih on 07/29/2022 13:41:46 -------------------------------------------------------------------------------- Multi-Disciplinary Care Plan Details Patient Name: Date of Service: Colin Birkenhead, Savien B. 07/29/2022 1:00 PM Medical Record Number: [id number] Patient Account Number: [id number] Date of Birth/Sex: Treating RN: 19-Mar-1966 (57 y.o. Laymond Purser Primary Care Jessy Calixte: Pearson Grippe Other Clinician: Referring Aidaly Cordner: Treating Ananya Mccleese/Extender: Cleotis Lema in Treatment: [address] [place], [place] B ([id number]) [id number] Page 5 of 8 Pressure Nursing Diagnoses: Knowledge deficit related to causes and risk factors for pressure ulcer development Knowledge deficit related to management of pressures ulcers Potential for impaired tissue integrity related to pressure, friction, moisture, and shear Goals: Patient will remain free from development of additional pressure ulcers Date Initiated: 12/03/2021 Date Inactivated: 07/15/2022 Target Resolution Date: 12/03/2021 Goal Status: Met Patient/caregiver will verbalize risk factors for pressure ulcer development Date Initiated: 12/03/2021 Target Resolution Date: 09/05/2022 Goal Status: Active Patient/caregiver will verbalize understanding of pressure ulcer management Date Initiated: 12/03/2021 Target Resolution Date: 09/05/2022 Goal Status: Active Interventions: Assess: immobility, friction, shearing, incontinence upon admission and as needed Assess offloading mechanisms upon admission and as needed Assess potential for pressure ulcer upon admission and as needed Provide education on pressure ulcers Notes: Wound/Skin Impairment Nursing  Diagnoses: Impaired tissue integrity Knowledge deficit related to ulceration/compromised skin integrity Goals: Ulcer/skin breakdown will have a volume reduction of 30% by week 4 Date Initiated: 10/15/2021 Date Inactivated: 07/29/2022 Target Resolution Date: 11/12/2021 Goal Status: Unmet Unmet Reason: underlying conditions Ulcer/skin breakdown will have a volume reduction of 50% by week 8 Date Initiated: 10/15/2021 Date Inactivated: 07/29/2022 Target Resolution Date: 12/10/2021 Goal Status: Met Ulcer/skin breakdown will have a volume reduction of 80% by week 12 Date Initiated: 10/15/2021 Date Inactivated: 07/29/2022 Target Resolution Date: 01/07/2022 Goal Status: Met Ulcer/skin breakdown will heal within 14 weeks Date Initiated: 10/15/2021 Target Resolution Date: 01/21/2022 Goal Status: Active Interventions: Assess patient/caregiver ability to obtain necessary supplies Assess patient/caregiver ability to perform ulcer/skin care regimen upon admission and as needed Assess ulceration(s) every visit Provide education on ulcer and skin care Notes: documentation for wound 8 Electronic Signature(s) Signed: 07/29/2022 4:43:31 PM By: Angelina Pih Entered By: Angelina Pih on 07/29/2022 14:03:18 -------------------------------------------------------------------------------- Pain Assessment Details Patient Name: Date of Service: TRAVONTA, GILL B. 07/29/2022 1:00 PM Medical Record Number: [id number] Patient Account Number: [id number] Date of Birth/Sex: Treating  RN: 1965-11-23 (57 y.o. Laymond Purser Primary Care Jaisean Monteforte: Pearson Grippe Other Clinician: Referring Addison Whidbee: Treating Breionna Punt/Extender: Cleotis Lema in Treatment: 41 Active Problems Location of Pain Severity and Description of Pain Patient Has Arrington Yohe, Strother B ([id number]) [id number] Page 6 of 8 Patient Has Paino Yes Site Locations Rate the pain. Current Pain  Level: 7 Pain Management and Medication Current Pain Management: Notes baseline pain per pt Electronic Signature(s) Signed: 07/29/2022 4:43:31 PM By: Angelina Pih Entered By: Angelina Pih on 07/29/2022 13:14:23 -------------------------------------------------------------------------------- Patient/Caregiver Education Details Patient Name: Date of Service: Maple Hudson 4/23/2024andnbsp1:00 PM Medical Record Number: [id number] Patient Account Number: [id number] Date of Birth/Gender: Treating RN: Apr 08, 1965 (57 y.o. Laymond Purser Primary Care Physician: Pearson Grippe Other Clinician: Referring Physician: Treating Physician/Extender: Cleotis Lema in Treatment: 66 Education Assessment Education Provided To: Patient Education Topics Provided Pressure: Handouts: Other: mattress Methods: Explain/Verbal Responses: State content correctly Wound/Skin Impairment: Handouts: Caring for Your Ulcer Methods: Explain/Verbal Responses: State content correctly Electronic Signature(s) Signed: 07/29/2022 4:43:31 PM By: Angelina Pih Entered By: Angelina Pih on 07/29/2022 14:03:35 -------------------------------------------------------------------------------- Wound Assessment Details Patient Name: Date of Service: JERRICO, COVELLO B. 07/29/2022 1:00 PM Medical Record Number: [id number] Patient Account Number: [id number] Date of Birth/Sex: Treating RN: [birth year] (57 y.o. 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 Wound Open Wounding Event: Pressure Injury Status: Date Acquired: 09/05/2021 Comorbid Hypotension, Myocardial Infarction, History of pressure  wounds, Weeks Of Treatment: 41 History: Rheumatoid Arthritis, Paraplegia, Confinement Anxiety Clustered Wound: No Photos Wound Measurements Length: (cm) 0.3 Width: (cm) 0.2 Depth: (cm) 0.1 Area: (cm) 0.047 Volume: (cm) 0.005 % Reduction in Area: 99.1% % Reduction in Volume: 99.9% Epithelialization: None Tunneling: No Undermining: No Wound Description Classification: Category/Stage III Exudate Amount: Medium Exudate Type: Serosanguineous Exudate Color: red, brown Foul Odor After Cleansing: No Slough/Fibrino No Wound Bed Granulation Amount: Large (67-100%) Exposed Structure Granulation Quality: Pink Fat Layer (Subcutaneous Tissue) Exposed: Yes Necrotic Amount: Small (1-33%) Treatment Notes Wound #8 (Gluteus) Wound Laterality: Left Cleanser Soap and Water Discharge Instruction: Gently cleanse wound with antibacterial soap, rinse and pat dry prior to dressing wounds Peri-Wound Care Skin Prep Discharge Instruction: Use skin prep as directed Topical Primary Dressing Hydrofera Blue Ready Transfer Foam, 2.5x2.5 (in/in) Discharge Instruction: Apply Hydrofera Blue Ready to wound bed as directed Secondary Dressing (BORDER) Zetuvit Plus SILICONE BORDER Dressing 5x5 (in/in) Discharge Instruction: Please do not put silicone bordered dressings under wraps. Use non-bordered dressing only. Secured With Compression Wrap Compression Stockings Add-Ons Electronic Signature(s) MUSGRAVE, Durk B ([id number]) [id number] Page 8 of 8 Signed: 07/29/2022 4:43:31 PM By: Angelina Pih Entered By: Angelina Pih on 07/29/2022 13:41:35 -------------------------------------------------------------------------------- Vitals Details Patient Name: Date of Service: Colin Birkenhead, [name] 07/29/2022 1:00 PM Medical Record Number: [id number] Patient Account Number: [id number] Date of Birth/Sex: Treating RN: 1965-09-10 (57 y.o. Laymond Purser Primary Care  Saud Bail: Pearson Grippe Other Clinician: Referring Lamoyne Hessel: Treating Konica Stankowski/Extender: Cleotis Lema in Treatment: 41 Vital Signs Time Taken: 13:02 Temperature (F): 98.20 Height (in): 73 Pulse (bpm): 80 Weight (lbs): 154 Respiratory Rate (breaths/min): 18 Body Mass Index (BMI): 20.3 Blood Pressure (mmHg): 143/100 Reference Range: 80 - 120 mg / dl Electronic Signature(s) Signed: 07/29/2022 4:43:31 PM By: Angelina Pih Entered By:  Angelina Pih on 07/29/2022 13:03:06

## 2022-08-12 ENCOUNTER — Ambulatory Visit: Payer: Medicaid Other | Admitting: Physician Assistant

## 2022-08-19 ENCOUNTER — Other Ambulatory Visit: Payer: Self-pay

## 2022-08-19 ENCOUNTER — Encounter (HOSPITAL_COMMUNITY): Payer: Self-pay | Admitting: Emergency Medicine

## 2022-08-19 ENCOUNTER — Emergency Department (HOSPITAL_COMMUNITY)
Admission: EM | Admit: 2022-08-19 | Discharge: 2022-08-20 | Disposition: A | Discharge status: Home / Self Care | Payer: Medicare Other | Attending: Emergency Medicine | Admitting: Emergency Medicine

## 2022-08-19 DIAGNOSIS — T839XXA Unspecified complication of genitourinary prosthetic device, implant and graft, initial encounter: Secondary | ICD-10-CM

## 2022-08-19 DIAGNOSIS — T83091A Other mechanical complication of indwelling urethral catheter, initial encounter: Secondary | ICD-10-CM | POA: Diagnosis present

## 2022-08-19 DIAGNOSIS — E876 Hypokalemia: Secondary | ICD-10-CM | POA: Insufficient documentation

## 2022-08-19 LAB — BASIC METABOLIC PANEL
Anion gap: 13 (ref 5–15)
BUN: 15 mg/dL (ref 6–20)
CO2: 25 mmol/L (ref 22–32)
Calcium: 9.8 mg/dL (ref 8.9–10.3)
Chloride: 100 mmol/L (ref 98–111)
Creatinine, Ser: 0.48 mg/dL — ABNORMAL LOW (ref 0.61–1.24)
GFR, Estimated: >60 mL/min (ref >60)
Glucose, Bld: 79 mg/dL (ref 70–99)
Potassium: 3.3 mmol/L — ABNORMAL LOW (ref 3.5–5.1)
Sodium: 138 mmol/L (ref 135–145)

## 2022-08-19 MED ORDER — POTASSIUM CHLORIDE CRYS ER 20 MEQ PO TBCR
40.0000 meq | EXTENDED_RELEASE_TABLET | Freq: Once | ORAL | Status: AC
  Administered 2022-08-19: 40 meq via ORAL
  Filled 2022-08-19: qty 2
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
## 2022-08-19 NOTE — ED Notes (Signed)
Legal guardian was called and updated on the patient status.

## 2022-08-19 NOTE — Discharge Instructions (Addendum)
It was our pleasure to provide your ER care today - we hope that you feel better.  Empty foley bag as need. Follow up with your doctor for subsequent foley catheter care and exchange per your normal routine.   Your potassium level is mildly low - eat plenty of fruits and vegetables and follow up with primary care doctor.   Return to ER if worse, new symptoms, fevers, new/severe pain, catheter not working, weak/fainting, or other concern.

## 2022-08-19 NOTE — ED Notes (Signed)
Patient has had 2 cups of water and I will offer some food now

## 2022-08-19 NOTE — ED Triage Notes (Signed)
Pt had a catheter inserted yesterday and it has clogged about 0930 and had an appointment today and they advised to not go to the appointment and come to ED

## 2022-08-20 MED ORDER — HYDROCODONE-ACETAMINOPHEN 5-325 MG PO TABS
1.0000 | ORAL_TABLET | Freq: Once | ORAL | Status: AC
  Administered 2022-08-20: 1 via ORAL
  Filled 2022-08-20: qty 1

## 2022-08-26 ENCOUNTER — Encounter: Payer: Medicare Other | Attending: Physician Assistant | Admitting: Physician Assistant

## 2022-08-26 DIAGNOSIS — L89153 Pressure ulcer of sacral region, stage 3: Secondary | ICD-10-CM | POA: Diagnosis present

## 2022-08-26 DIAGNOSIS — L89893 Pressure ulcer of other site, stage 3: Secondary | ICD-10-CM | POA: Insufficient documentation

## 2022-08-26 DIAGNOSIS — G8254 Quadriplegia, C5-C7 incomplete: Secondary | ICD-10-CM | POA: Diagnosis not present

## 2022-08-26 DIAGNOSIS — Z931 Gastrostomy status: Secondary | ICD-10-CM | POA: Insufficient documentation

## 2022-09-09 ENCOUNTER — Encounter: Payer: Medicare Other | Attending: Physician Assistant | Admitting: Physician Assistant

## 2022-09-09 DIAGNOSIS — G8254 Quadriplegia, C5-C7 incomplete: Secondary | ICD-10-CM | POA: Insufficient documentation

## 2022-09-09 DIAGNOSIS — L89153 Pressure ulcer of sacral region, stage 3: Secondary | ICD-10-CM | POA: Diagnosis present

## 2022-09-09 DIAGNOSIS — L89893 Pressure ulcer of other site, stage 3: Secondary | ICD-10-CM | POA: Diagnosis not present

## 2022-09-09 DIAGNOSIS — Z931 Gastrostomy status: Secondary | ICD-10-CM | POA: Diagnosis not present

## 2022-09-17 ENCOUNTER — Emergency Department (HOSPITAL_COMMUNITY): Payer: Medicare Other

## 2022-09-17 ENCOUNTER — Encounter (HOSPITAL_COMMUNITY): Payer: Self-pay | Admitting: Emergency Medicine

## 2022-09-17 ENCOUNTER — Other Ambulatory Visit: Payer: Self-pay

## 2022-09-17 ENCOUNTER — Inpatient Hospital Stay (HOSPITAL_COMMUNITY)
Admission: EM | Admit: 2022-09-17 | Discharge: 2022-09-20 | DRG: 698 | Disposition: A | Discharge status: Home Health | Payer: Medicare Other | Attending: Internal Medicine | Admitting: Internal Medicine

## 2022-09-17 DIAGNOSIS — N390 Urinary tract infection, site not specified: Secondary | ICD-10-CM | POA: Diagnosis present

## 2022-09-17 DIAGNOSIS — K219 Gastro-esophageal reflux disease without esophagitis: Secondary | ICD-10-CM | POA: Diagnosis present

## 2022-09-17 DIAGNOSIS — S14109S Unspecified injury at unspecified level of cervical spinal cord, sequela: Secondary | ICD-10-CM

## 2022-09-17 DIAGNOSIS — Y846 Urinary catheterization as the cause of abnormal reaction of the patient, or of later complication, without mention of misadventure at the time of the procedure: Secondary | ICD-10-CM | POA: Diagnosis present

## 2022-09-17 DIAGNOSIS — Z978 Presence of other specified devices: Secondary | ICD-10-CM | POA: Diagnosis not present

## 2022-09-17 DIAGNOSIS — Z1152 Encounter for screening for COVID-19: Secondary | ICD-10-CM

## 2022-09-17 DIAGNOSIS — E876 Hypokalemia: Secondary | ICD-10-CM | POA: Diagnosis present

## 2022-09-17 DIAGNOSIS — Z888 Allergy status to other drugs, medicaments and biological substances status: Secondary | ICD-10-CM | POA: Diagnosis not present

## 2022-09-17 DIAGNOSIS — G8250 Quadriplegia, unspecified: Secondary | ICD-10-CM | POA: Diagnosis present

## 2022-09-17 DIAGNOSIS — T839XXA Unspecified complication of genitourinary prosthetic device, implant and graft, initial encounter: Secondary | ICD-10-CM

## 2022-09-17 DIAGNOSIS — Z91013 Allergy to seafood: Secondary | ICD-10-CM | POA: Diagnosis not present

## 2022-09-17 DIAGNOSIS — F419 Anxiety disorder, unspecified: Secondary | ICD-10-CM | POA: Diagnosis present

## 2022-09-17 DIAGNOSIS — R339 Retention of urine, unspecified: Secondary | ICD-10-CM | POA: Diagnosis present

## 2022-09-17 DIAGNOSIS — A4181 Sepsis due to Enterococcus: Secondary | ICD-10-CM | POA: Diagnosis not present

## 2022-09-17 DIAGNOSIS — Z681 Body mass index (BMI) 19 or less, adult: Secondary | ICD-10-CM

## 2022-09-17 DIAGNOSIS — A419 Sepsis, unspecified organism: Secondary | ICD-10-CM | POA: Diagnosis present

## 2022-09-17 DIAGNOSIS — R636 Underweight: Secondary | ICD-10-CM | POA: Diagnosis present

## 2022-09-17 DIAGNOSIS — N3000 Acute cystitis without hematuria: Secondary | ICD-10-CM | POA: Diagnosis not present

## 2022-09-17 DIAGNOSIS — T83518A Infection and inflammatory reaction due to other urinary catheter, initial encounter: Secondary | ICD-10-CM | POA: Diagnosis present

## 2022-09-17 DIAGNOSIS — I10 Essential (primary) hypertension: Secondary | ICD-10-CM | POA: Diagnosis present

## 2022-09-17 DIAGNOSIS — G8254 Quadriplegia, C5-C7 incomplete: Secondary | ICD-10-CM | POA: Diagnosis present

## 2022-09-17 DIAGNOSIS — Z79899 Other long term (current) drug therapy: Secondary | ICD-10-CM

## 2022-09-17 LAB — URINALYSIS, ROUTINE W REFLEX MICROSCOPIC
Bilirubin Urine: NEGATIVE
Glucose, UA: NEGATIVE mg/dL
Ketones, ur: 5 mg/dL — AB
Nitrite: POSITIVE — AB
Protein, ur: 100 mg/dL — AB
RBC / HPF: >50 RBC/hpf (ref 0–5)
Specific Gravity, Urine: 1.012 (ref 1.005–1.030)
WBC, UA: >50 WBC/hpf (ref 0–5)
pH: 6 (ref 5.0–8.0)

## 2022-09-17 LAB — CBC WITH DIFFERENTIAL/PLATELET
Abs Immature Granulocytes: 0.08 10*3/uL — ABNORMAL HIGH (ref 0.00–0.07)
Basophils Absolute: 0 10*3/uL (ref 0.0–0.1)
Basophils Relative: 0 %
Eosinophils Absolute: 0 10*3/uL (ref 0.0–0.5)
Eosinophils Relative: 0 %
HCT: 38.6 % — ABNORMAL LOW (ref 39.0–52.0)
Hemoglobin: 12.9 g/dL — ABNORMAL LOW (ref 13.0–17.0)
Immature Granulocytes: 1 %
Lymphocytes Relative: 10 %
Lymphs Abs: 1.6 10*3/uL (ref 0.7–4.0)
MCH: 26.8 pg (ref 26.0–34.0)
MCHC: 33.4 g/dL (ref 30.0–36.0)
MCV: 80.1 fL (ref 80.0–100.0)
Monocytes Absolute: 1.1 10*3/uL — ABNORMAL HIGH (ref 0.1–1.0)
Monocytes Relative: 7 %
Neutro Abs: 13.1 10*3/uL — ABNORMAL HIGH (ref 1.7–7.7)
Neutrophils Relative %: 82 %
Platelets: 320 10*3/uL (ref 150–400)
RBC: 4.82 MIL/uL (ref 4.22–5.81)
RDW: 14.6 % (ref 11.5–15.5)
WBC: 16 10*3/uL — ABNORMAL HIGH (ref 4.0–10.5)
nRBC: 0 % (ref 0.0–0.2)

## 2022-09-17 LAB — PROTIME-INR
INR: 1.1 (ref 0.8–1.2)
Prothrombin Time: 14.9 seconds (ref 11.4–15.2)

## 2022-09-17 LAB — APTT: aPTT: 32 seconds (ref 24–36)

## 2022-09-17 LAB — COMPREHENSIVE METABOLIC PANEL
ALT: 14 U/L (ref 0–44)
AST: 16 U/L (ref 15–41)
Albumin: 3.9 g/dL (ref 3.5–5.0)
Alkaline Phosphatase: 96 U/L (ref 38–126)
Anion gap: 13 (ref 5–15)
BUN: 22 mg/dL — ABNORMAL HIGH (ref 6–20)
CO2: 22 mmol/L (ref 22–32)
Calcium: 10 mg/dL (ref 8.9–10.3)
Chloride: 98 mmol/L (ref 98–111)
Creatinine, Ser: 0.52 mg/dL — ABNORMAL LOW (ref 0.61–1.24)
GFR, Estimated: >60 mL/min (ref >60)
Glucose, Bld: 76 mg/dL (ref 70–99)
Potassium: 3.7 mmol/L (ref 3.5–5.1)
Sodium: 133 mmol/L — ABNORMAL LOW (ref 135–145)
Total Bilirubin: 0.8 mg/dL (ref 0.3–1.2)
Total Protein: 8.2 g/dL — ABNORMAL HIGH (ref 6.5–8.1)

## 2022-09-17 LAB — RESP PANEL BY RT-PCR (RSV, FLU A&B, COVID)  RVPGX2
Influenza A by PCR: NEGATIVE
Influenza B by PCR: NEGATIVE
Resp Syncytial Virus by PCR: NEGATIVE
SARS Coronavirus 2 by RT PCR: NEGATIVE

## 2022-09-17 LAB — LACTIC ACID, PLASMA
Lactic Acid, Venous: 1.2 mmol/L (ref 0.5–1.9)
Lactic Acid, Venous: 1.6 mmol/L (ref 0.5–1.9)

## 2022-09-17 MED ORDER — ENOXAPARIN SODIUM 40 MG/0.4ML IJ SOSY
40.0000 mg | PREFILLED_SYRINGE | INTRAMUSCULAR | Status: DC
  Administered 2022-09-18 – 2022-09-20 (×3): 40 mg via SUBCUTANEOUS
  Filled 2022-09-17 (×3): qty 0.4

## 2022-09-17 MED ORDER — ACETAMINOPHEN 650 MG RE SUPP: 650.0000 mg | Freq: Four times a day (QID) | RECTAL | Status: DC | PRN

## 2022-09-17 MED ORDER — ACETAMINOPHEN 500 MG PO TABS
1000.0000 mg | ORAL_TABLET | Freq: Once | ORAL | Status: AC
  Administered 2022-09-17: 1000 mg via ORAL
  Filled 2022-09-17: qty 2

## 2022-09-17 MED ORDER — SODIUM CHLORIDE 0.9 % IV SOLN
2.0000 g | Freq: Once | INTRAVENOUS | Status: AC
  Administered 2022-09-17: 2 g via INTRAVENOUS
  Filled 2022-09-17: qty 12.5

## 2022-09-17 MED ORDER — VANCOMYCIN HCL IN DEXTROSE 1-5 GM/200ML-% IV SOLN: 1000.0000 mg | Freq: Once | INTRAVENOUS | Status: DC

## 2022-09-17 MED ORDER — POLYETHYLENE GLYCOL 3350 17 G PO PACK: 17.0000 g | PACK | Freq: Every day | ORAL | Status: DC | PRN

## 2022-09-17 MED ORDER — SODIUM CHLORIDE 0.9 % IV SOLN: INTRAVENOUS | Status: AC

## 2022-09-17 MED ORDER — LACTATED RINGERS IV BOLUS (SEPSIS)
1000.0000 mL | Freq: Once | INTRAVENOUS | Status: AC
  Administered 2022-09-17: 1000 mL via INTRAVENOUS

## 2022-09-17 MED ORDER — MIDODRINE HCL 5 MG PO TABS
5.0000 mg | ORAL_TABLET | Freq: Three times a day (TID) | ORAL | Status: DC
  Administered 2022-09-18 – 2022-09-20 (×5): 5 mg via ORAL
  Filled 2022-09-17 (×7): qty 1

## 2022-09-17 MED ORDER — LACTATED RINGERS IV SOLN: INTRAVENOUS | Status: DC

## 2022-09-17 MED ORDER — METRONIDAZOLE 500 MG/100ML IV SOLN
500.0000 mg | Freq: Once | INTRAVENOUS | Status: AC
  Administered 2022-09-17: 500 mg via INTRAVENOUS
  Filled 2022-09-17: qty 100

## 2022-09-17 MED ORDER — VANCOMYCIN HCL 1250 MG/250ML IV SOLN
1250.0000 mg | Freq: Once | INTRAVENOUS | Status: AC
  Administered 2022-09-17: 1250 mg via INTRAVENOUS
  Filled 2022-09-17: qty 250

## 2022-09-17 MED ORDER — ACETAMINOPHEN 325 MG PO TABS: 650.0000 mg | ORAL_TABLET | Freq: Four times a day (QID) | ORAL | Status: DC | PRN

## 2022-09-17 NOTE — Sepsis Progress Note (Signed)
Elink will follow per sepsis protocol  
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
## 2022-09-17 NOTE — ED Triage Notes (Signed)
Home health placed foley yesterday and since pt has had chills and very little urine output. Pt seen pcp today and was told everything was ok.

## 2022-09-17 NOTE — Assessment & Plan Note (Addendum)
Sepsis secondary to complicated UTI.  UTI complicated by the presence of chronic indwelling Foley catheter.  Meeting sepsis criteria with fever of 100.7, and leukocytosis of 16, tachycardia heart rate 86-134.  Lactic acid 1.2> 1.6.  Portable chest x-ray negative for acute abnormality. -IV vancomycin, cefepime and metronidazole started in ED -Foley changed in ED today - 2 L bolus given, continue N/s 100cc/hr x 15hrs -Last urine cultures 11/2020 grew Morganella Morgagni and Serratia marsenses,, ciprofloxacin, Bactrim and gentamicin -Follow-up blood and urine cultures -IV Levaquin for now - Pharmacy consult to assist with choice of antibiotic
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
## 2022-09-17 NOTE — Progress Notes (Signed)
A consult was received from an ED physician for Vancomycin and cefepime per pharmacy dosing.  The patient's profile has been reviewed for ht/wt/allergies/indication/available labs.   A one time order has been placed for Cefepime 2gm IV and Vancomycin 1250mg  IV x 1.  Further antibiotics/pharmacy consults should be ordered by admitting physician if indicated.                       Thank you, Tera Mater 09/17/2022  4:56 PM

## 2022-09-18 DIAGNOSIS — A419 Sepsis, unspecified organism: Secondary | ICD-10-CM | POA: Diagnosis not present

## 2022-09-18 LAB — HIV ANTIBODY (ROUTINE TESTING W REFLEX): HIV Screen 4th Generation wRfx: NONREACTIVE

## 2022-09-18 LAB — CBC
HCT: 31.8 % — ABNORMAL LOW (ref 39.0–52.0)
Hemoglobin: 10.4 g/dL — ABNORMAL LOW (ref 13.0–17.0)
MCH: 26.1 pg (ref 26.0–34.0)
MCHC: 32.7 g/dL (ref 30.0–36.0)
MCV: 79.9 fL — ABNORMAL LOW (ref 80.0–100.0)
Platelets: 269 10*3/uL (ref 150–400)
RBC: 3.98 MIL/uL — ABNORMAL LOW (ref 4.22–5.81)
RDW: 14.6 % (ref 11.5–15.5)
WBC: 13.1 10*3/uL — ABNORMAL HIGH (ref 4.0–10.5)
nRBC: 0 % (ref 0.0–0.2)

## 2022-09-18 LAB — BASIC METABOLIC PANEL
Anion gap: 8 (ref 5–15)
BUN: 12 mg/dL (ref 6–20)
CO2: 24 mmol/L (ref 22–32)
Calcium: 8.9 mg/dL (ref 8.9–10.3)
Chloride: 105 mmol/L (ref 98–111)
Creatinine, Ser: 0.4 mg/dL — ABNORMAL LOW (ref 0.61–1.24)
GFR, Estimated: >60 mL/min (ref >60)
Glucose, Bld: 86 mg/dL (ref 70–99)
Potassium: 3.3 mmol/L — ABNORMAL LOW (ref 3.5–5.1)
Sodium: 137 mmol/L (ref 135–145)

## 2022-09-18 MED ORDER — ALPRAZOLAM 0.5 MG PO TABS
0.5000 mg | ORAL_TABLET | Freq: Every day | ORAL | Status: DC | PRN
  Administered 2022-09-20: 0.5 mg via ORAL
  Filled 2022-09-18: qty 1

## 2022-09-18 MED ORDER — ORAL CARE MOUTH RINSE: 15.0000 mL | OROMUCOSAL | Status: DC | PRN

## 2022-09-18 MED ORDER — BACLOFEN 10 MG PO TABS
10.0000 mg | ORAL_TABLET | Freq: Two times a day (BID) | ORAL | Status: DC
  Administered 2022-09-18 – 2022-09-20 (×5): 10 mg via ORAL
  Filled 2022-09-18 (×5): qty 1

## 2022-09-18 MED ORDER — PANTOPRAZOLE SODIUM 40 MG PO TBEC
40.0000 mg | DELAYED_RELEASE_TABLET | Freq: Every day | ORAL | Status: DC
  Administered 2022-09-18 – 2022-09-20 (×3): 40 mg via ORAL
  Filled 2022-09-18 (×3): qty 1

## 2022-09-18 MED ORDER — GABAPENTIN 300 MG PO CAPS
600.0000 mg | ORAL_CAPSULE | Freq: Two times a day (BID) | ORAL | Status: DC
  Administered 2022-09-18 – 2022-09-20 (×5): 600 mg via ORAL
  Filled 2022-09-18 (×5): qty 2

## 2022-09-18 MED ORDER — DOCUSATE SODIUM 100 MG PO CAPS
100.0000 mg | ORAL_CAPSULE | Freq: Every day | ORAL | Status: DC
  Administered 2022-09-18 – 2022-09-20 (×3): 100 mg via ORAL
  Filled 2022-09-18 (×3): qty 1

## 2022-09-18 MED ORDER — CHLORHEXIDINE GLUCONATE CLOTH 2 % EX PADS
6.0000 | MEDICATED_PAD | Freq: Every day | CUTANEOUS | Status: DC
  Administered 2022-09-18 – 2022-09-20 (×3): 6 via TOPICAL

## 2022-09-18 MED ORDER — BUPROPION HCL ER (XL) 150 MG PO TB24: 150.0000 mg | ORAL_TABLET | Freq: Every day | ORAL | Status: DC

## 2022-09-18 MED ORDER — POTASSIUM CHLORIDE CRYS ER 20 MEQ PO TBCR
40.0000 meq | EXTENDED_RELEASE_TABLET | Freq: Once | ORAL | Status: AC
  Administered 2022-09-18: 40 meq via ORAL
  Filled 2022-09-18: qty 2

## 2022-09-18 MED ORDER — LEVOFLOXACIN IN D5W 500 MG/100ML IV SOLN
500.0000 mg | Freq: Every day | INTRAVENOUS | Status: DC
  Administered 2022-09-18 – 2022-09-19 (×2): 500 mg via INTRAVENOUS
  Filled 2022-09-18 (×3): qty 100

## 2022-09-18 MED ORDER — BISACODYL 10 MG RE SUPP
10.0000 mg | RECTAL | Status: DC
  Administered 2022-09-19: 10 mg via RECTAL
  Filled 2022-09-18: qty 1

## 2022-09-18 NOTE — TOC CM/SW Note (Addendum)
Transition of Care [facility]) - Inpatient Brief Assessment   Patient Details  Name: [name] MRN: [id number] Date of Birth: [birth year]  [facility] of Care [facility]) CM/SW Contact:    Elliot Gault, LCSW Phone Number: 09/18/2022, 8:52 AM   Clinical Narrative:  Pt from home with daughter. Dtr and her mother are pt's caregivers. Pt is active with [facility]. TOC will follow and assist with new HH orders and any other needs for dc.  Transition of Care Asessment: Insurance and Status: Insurance coverage has been reviewed Patient has primary care physician: Yes Home environment has been reviewed: from home with daughter Prior level of function:: total care Prior/Current Home Services: Current home services [facility])   Readmission risk has been reviewed: Yes Transition of care needs: transition of care needs identified, TOC will continue to follow

## 2022-09-18 NOTE — Progress Notes (Signed)
Pharmacy Antibiotic Note  [name] is a 57 y.o. male with h/o quadriplegia and indwelling Foley catheter admitted on 09/17/2022 with urosepsis.  Pharmacy has been consulted for Levaquin dosing.  Plan: Levaquin 500 mg IV q24h F/U urine cultures   Height: 6\' 1"  (185.4 cm) Weight: 59.9 kg (132 lb 0.9 oz) IBW/kg (Calculated) : 79.9  Temp (24hrs), Avg:99.3 F (37.4 C), Min:97.7 F (36.5 C), Max:100.7 F (38.2 C)  Recent Labs  Lab 09/17/22 1653 09/17/22 1654 09/17/22 1710 09/17/22 1941  WBC  --  16.0*  --   --   CREATININE 0.52*  --   --   --   LATICACIDVEN  --   --  1.2 1.6    Estimated Creatinine Clearance: 86.3 mL/min (A) (by C-G formula based on SCr of 0.52 mg/dL (L)).    Allergies  Allergen Reactions   Niacin And Related Hives   Other Hives    From work uniform   Shellfish Allergy Swelling    Lip swelling     Eddie Candle 09/18/2022 12:08 AM
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
## 2022-09-18 NOTE — Plan of Care (Signed)
  Problem: Education: Goal: Knowledge of General Education information will improve Description: Including pain rating scale, medication(s)/side effects and non-pharmacologic comfort measures Outcome: Progressing   Problem: Health Behavior/Discharge Planning: Goal: Ability to manage health-related needs will improve Outcome: Progressing   Problem: Clinical Measurements: Goal: Ability to maintain clinical measurements within normal limits will improve Outcome: Progressing   Problem: Clinical Measurements: Goal: Will remain free from infection Outcome: Progressing   Problem: Clinical Measurements: Goal: Diagnostic test results will improve Outcome: Progressing   Problem: Activity: Goal: Risk for activity intolerance will decrease Outcome: Progressing   Problem: Nutrition: Goal: Adequate nutrition will be maintained Outcome: Progressing   

## 2022-09-19 DIAGNOSIS — A419 Sepsis, unspecified organism: Secondary | ICD-10-CM | POA: Diagnosis not present

## 2022-09-19 DIAGNOSIS — N3000 Acute cystitis without hematuria: Secondary | ICD-10-CM | POA: Diagnosis not present

## 2022-09-19 DIAGNOSIS — R339 Retention of urine, unspecified: Secondary | ICD-10-CM | POA: Diagnosis not present

## 2022-09-19 LAB — CBC WITH DIFFERENTIAL/PLATELET
Abs Immature Granulocytes: 0.03 10*3/uL (ref 0.00–0.07)
Basophils Absolute: 0.1 10*3/uL (ref 0.0–0.1)
Basophils Relative: 1 %
Eosinophils Absolute: 0.6 10*3/uL — ABNORMAL HIGH (ref 0.0–0.5)
Eosinophils Relative: 7 %
HCT: 31.2 % — ABNORMAL LOW (ref 39.0–52.0)
Hemoglobin: 10.4 g/dL — ABNORMAL LOW (ref 13.0–17.0)
Immature Granulocytes: 0 %
Lymphocytes Relative: 26 %
Lymphs Abs: 2.1 10*3/uL (ref 0.7–4.0)
MCH: 26.9 pg (ref 26.0–34.0)
MCHC: 33.3 g/dL (ref 30.0–36.0)
MCV: 80.8 fL (ref 80.0–100.0)
Monocytes Absolute: 0.7 10*3/uL (ref 0.1–1.0)
Monocytes Relative: 9 %
Neutro Abs: 4.7 10*3/uL (ref 1.7–7.7)
Neutrophils Relative %: 57 %
Platelets: 265 10*3/uL (ref 150–400)
RBC: 3.86 MIL/uL — ABNORMAL LOW (ref 4.22–5.81)
RDW: 14.8 % (ref 11.5–15.5)
WBC: 8.1 10*3/uL (ref 4.0–10.5)
nRBC: 0 % (ref 0.0–0.2)

## 2022-09-19 LAB — BASIC METABOLIC PANEL
Anion gap: 6 (ref 5–15)
BUN: 8 mg/dL (ref 6–20)
CO2: 24 mmol/L (ref 22–32)
Calcium: 8.9 mg/dL (ref 8.9–10.3)
Chloride: 105 mmol/L (ref 98–111)
Creatinine, Ser: 0.39 mg/dL — ABNORMAL LOW (ref 0.61–1.24)
GFR, Estimated: >60 mL/min (ref >60)
Glucose, Bld: 80 mg/dL (ref 70–99)
Potassium: 3.7 mmol/L (ref 3.5–5.1)
Sodium: 135 mmol/L (ref 135–145)

## 2022-09-19 LAB — CULTURE, BLOOD (ROUTINE X 2)
Culture: NO GROWTH
Culture: NO GROWTH

## 2022-09-19 LAB — URINE CULTURE: Culture: 40000 — AB

## 2022-09-19 NOTE — Plan of Care (Signed)

## 2022-09-19 NOTE — Care Management Important Message (Signed)
Important Message  Patient Details  Name: [name] MRN: [id number] Date of Birth: 25-Apr-1965   Medicare Important Message Given:  Yes     Corey Harold 09/19/2022, 3:15 PM
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
## 2022-09-20 DIAGNOSIS — A4181 Sepsis due to Enterococcus: Secondary | ICD-10-CM | POA: Diagnosis not present

## 2022-09-20 DIAGNOSIS — N390 Urinary tract infection, site not specified: Secondary | ICD-10-CM | POA: Diagnosis not present

## 2022-09-20 DIAGNOSIS — G8254 Quadriplegia, C5-C7 incomplete: Secondary | ICD-10-CM

## 2022-09-20 LAB — MAGNESIUM: Magnesium: 1.9 mg/dL (ref 1.7–2.4)

## 2022-09-20 LAB — BASIC METABOLIC PANEL
Anion gap: 7 (ref 5–15)
BUN: 9 mg/dL (ref 6–20)
CO2: 25 mmol/L (ref 22–32)
Calcium: 9 mg/dL (ref 8.9–10.3)
Chloride: 103 mmol/L (ref 98–111)
Creatinine, Ser: 0.4 mg/dL — ABNORMAL LOW (ref 0.61–1.24)
GFR, Estimated: >60 mL/min (ref >60)
Glucose, Bld: 74 mg/dL (ref 70–99)
Potassium: 3.6 mmol/L (ref 3.5–5.1)
Sodium: 135 mmol/L (ref 135–145)

## 2022-09-20 LAB — CBC
HCT: 32.9 % — ABNORMAL LOW (ref 39.0–52.0)
Hemoglobin: 10.9 g/dL — ABNORMAL LOW (ref 13.0–17.0)
MCH: 26.7 pg (ref 26.0–34.0)
MCHC: 33.1 g/dL (ref 30.0–36.0)
MCV: 80.4 fL (ref 80.0–100.0)
Platelets: 288 10*3/uL (ref 150–400)
RBC: 4.09 MIL/uL — ABNORMAL LOW (ref 4.22–5.81)
RDW: 14.5 % (ref 11.5–15.5)
WBC: 5.6 10*3/uL (ref 4.0–10.5)
nRBC: 0 % (ref 0.0–0.2)

## 2022-09-20 LAB — CULTURE, BLOOD (ROUTINE X 2)

## 2022-09-20 LAB — URINE CULTURE

## 2022-09-20 MED ORDER — AMOXICILLIN 250 MG PO CAPS
500.0000 mg | ORAL_CAPSULE | Freq: Three times a day (TID) | ORAL | Status: DC
  Administered 2022-09-20: 500 mg via ORAL
  Filled 2022-09-20: qty 2

## 2022-09-20 MED ORDER — AMOXICILLIN 500 MG PO CAPS: 500.0000 mg | ORAL_CAPSULE | Freq: Three times a day (TID) | ORAL | 0 refills | Status: DC

## 2022-09-20 NOTE — TOC Transition Note (Signed)
Transition of Care [facility]) - CM/SW Discharge Note   Patient Details  Name: [name] MRN: [id number] Date of Birth: 04/19/1965  Transition of Care [facility]) CM/SW Contact:  Catalina Gravel, LCSW Phone Number: 09/20/2022, 2:03 PM   Clinical Narrative:    CSW requested HHRN orders at progression as pt active with [facility]. Pt DC ready today.  CSW contacted Hilbert Corrigan at [facility] agency to advise pt DC ing today. No further TOC needs.      Barriers to Discharge: No Barriers Identified   Patient Goals and CMS Choice      Discharge Placement                         Discharge Plan and Services Additional resources added to the After Visit Summary for                                       Social Determinants of Health (SDOH) Interventions SDOH Screenings   Tobacco Use: Low Risk  (09/17/2022)     Readmission Risk Interventions     No data to display
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
## 2022-09-20 NOTE — Progress Notes (Signed)
Patient states understanding of discharge instructions and that daughter is aware of discharge. Patient states family is at the home waiting for his arrival, EMS called

## 2022-09-21 LAB — URINE CULTURE

## 2022-09-21 LAB — CULTURE, BLOOD (ROUTINE X 2)

## 2022-09-22 DIAGNOSIS — I10 Essential (primary) hypertension: Secondary | ICD-10-CM | POA: Insufficient documentation

## 2022-09-22 LAB — CULTURE, BLOOD (ROUTINE X 2): Special Requests: ADEQUATE

## 2022-09-22 NOTE — Progress Notes (Signed)
MERWIN, BOURGAULT B ([id number]) [id number] Page 1 of 8 Visit Report for 08/26/2022 Chief Complaint Document Details Patient Name: Date of Service: [name], [name] 08/26/2022 12:15 PM Medical Record Number: [id number] Patient Account Number: [id number] Date of Birth/Sex: Treating RN: [birth year] (57 y.o. [name] Primary Care Provider: Pearson Grippe Other Clinician: Referring Provider: Treating Provider/Extender: Cleotis Lema in Treatment: 45 Information Obtained from: Patient Chief Complaint Sacral and back pressure ulcers Electronic Signature(s) Signed: 08/26/2022 12:26:30 PM By: Allen Derry PA-C Entered By: Allen Derry on 08/26/2022 12:26:29 -------------------------------------------------------------------------------- HPI Details Patient Name: Date of Service: [name], [name] 08/26/2022 12:15 PM Medical Record Number: [id number] Patient Account Number: [id number] Date of Birth/Sex: Treating RN: 07-08-1965 (58 y.o. [name] Primary Care Provider: Pearson Grippe Other Clinician: Referring Provider: Treating Provider/Extender: Cleotis Lema in Treatment: 45 History of Present Illness ssociated Signs and Symptoms: Patient has a history of confirmed osteomyelitis of the left foot according to MRI July 2019. 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 HPI Description: 12/08/17 on evaluation today patient presents for initial evaluation and our clinic concerning issues he has been having with his feet bilaterally although the left more than right most recently. He was admitted to hospital where he was placed on IV vancomycin which actually he continue to utilize until discharge and even when discharged continue to be on until around 16 August according to what he tells me. His PICC line was removed at that point. Nonetheless he has been  seen in the wound care center in [facility] before transferring to [place] due to the fact that the doctor there left and they are no longer open. Nonetheless he does have significant osteomyelitis of the left foot noted on MRI which revealed significant issues with necrotic bone including a portion of the distal region of his left great toe which was felt to possibly either be missing as a result of amputation or potentially resorption. Either way he also had osteomyelitis noted of the digit, metatarsal region, cuneiform, and navicular bones. There is definite bone exposure noted externally at this point in time. In regard to the right foot he has issues with ulcerations here as well although he states that recently he's had no issues until this just blistered and reopened. Apparently Xeroform was used in this has caused things to be somewhat more moist and open more according to the patient. He's otherwise been tolerating the dressing changes without complication using silver alginate dressings. He has no discomfort but does seem to be extremely stressed regarding the fact that he did see Dr. 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 The patient stated per notes reviewed that he did not want to have any surgery and therefore has a repeat appointment scheduled with the surgeon on 12/15/17. With all that being said he feels like that he really has not been alerted to what was going on in the severity that was until just recently and has a lot of questions in that regard. I'll be see I explained that seeing him for the first time today I cannot answer a lot of those questions that he has. MICHAELRYAN, MOESSNER B ([id number]) [id number] Page 2 of 8 12/21/17 on evaluation today patient actually appears to be doing a little better in regard to the right foot in particular. There is one area where there still definitive  bone noted although the MRI  which I did review today that we had ordered was negative for any evidence of osteomyelitis which is good news. Nonetheless there was some necrotic bone noted. Overall the patient appears to be doing fairly well however in regard to the right foot. Unfortunately he does have a new open area on the posterior aspect of his left leg as well as the continued area of necrosis noted in the central portion of the foot. He states that he's actually going to be sent for reevaluation specifically to a limb salvage clinic at [facility] he believes this is something that is primary care provider has been working with. They were just awaiting the results of the MRI. 01/08/18 on evaluation today patient actually appears to be doing maybe just a little better in regard to the overall appearance of his bilateral foot ulcers. In general this does not seem to have worsened at least which is good news. He still has evidence of bone noted on the surface of both wound areas which though dry appearing really has not otherwise changed at this time. He actually has an appointment at [facility] with the wound center to discuss limb salvage. He discusses with me at the last visit. Nonetheless he was somewhat upset today about a couple things one being that he stated that we did not put the order in for the protective dressing for his knees that we discussed last time. With that being said I had put in the order for the protective dressing in fact it was on his order sheet that was signed on 12/21/17. Nonetheless unfortunately it appears this was overlooked by home health and they told him if they had the order they could put the protective dressing on. Nonetheless they told him they did not have the order. Again I believe this was an oversight nothing intentional on anyone's part nonetheless the order was there. Still I had explained to the patient sometimes insurance will not cover for the protective dressing even if it is ordered  and this was discussed in the last visit. 01/25/18 on evaluation today patient presents for follow-up concerning his bilateral lower extremity ulcers. He has actually been doing about the same since I last saw him. We do have them in the room we can look at his gluteal region today as well to ensure that there is nothing open or if so address the issue there. Nonetheless he was supposed to see you and see wound care/limb salvage prior to his visit with me today. Unfortunately when he arrived last time at their clinic he apparently was there on the wrong day the doctor was not even present that he was his to be seen. Nonetheless I had to be switched to a different day and the patient actually is going in the next week to see them. Assuming everything works out this should actually be tomorrow. Nonetheless if they take over care of that point then we will subsequently release care to them. Readmission: 10-15-2021 upon evaluation today patient appears to be doing somewhat poorly in regard to wounds over the left gluteal region. He also has areas in the midline sacrum although this is not quite as bad. There is however some evidence of necrotic bone noted at this point unfortunately. The question is whether this is just something working its way out or if it something that is actually actively infected at this point. We are going to have to evaluate that further little by  little as we go with additional diagnostics. With that being said the patient tells me currently that what we are seeing him for has been open since around 09-05-2021 for the gluteal area on the left and in regard to the sacrum 10-01-2021 he states. With that being said he again is not too significant as far as the overall appearance of the wounds are concerned but again with the bone exposed this is something that we may want to do at pathology and culture in regard to. 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 This is a gentleman whom I have previously seen back in 2019. The wound that we were taking care of back at that time is completely cleared. 7/25; 2-week follow-up. The patient has 3 small open areas in the lower sacrum and then an area over the left ischial tuberosity. Both of these are stage III wounds. He has been using Hydrofera Blue. 11-12-2021 upon evaluation today patient appears to be doing well currently in regard to his wounds. The distal sacral area is almost completely closed and looks to be doing great. In regard to the left gluteal region this is showing signs of improvement from a depth perspective size is about the same. Overall I am extremely pleased with where things stand compared to what we were previous. 12-03-2021 upon evaluation today patient appears to be doing well currently in regard to his wound. He is actually showing signs of improvement the sacral region and the right ischial/gluteal region is completely healed. The left ischial/gluteal region is still open but seems to be doing much better. 12-24-2021 upon evaluation today patient appears to be doing well currently in regard to some of his wounds others have reopened or are not doing quite as well. Overall though I think that he is on a good track he just may be needs to be a little bit better on appropriate offloading we discussed that today as well. 01-27-2022 upon evaluation today patient appears to be doing well currently in regard to his wound. He is not showing any signs of significant tissue breakdown but unfortunately the wound is larger than what it was last time I saw him. He tells me that this seems to be a complete shock based on what he is hearing today. He tells me that his son was telling him this has been doing well and that it was smaller and not really bleeding or draining much. Again this is  definitely significantly larger than last time I saw him but does not appear to be obviously infected there is some undermining noted as well. In general I am unsure of what would have caused such a dramatic changes what he seems to feel has happened here but nonetheless indeed I do not feel like that this is doing nearly as well as last time I saw him. 11/6; his wound measured better today per our intake nurse using silver alginate and border foam although according to the patient were having trouble with supplies especially the border foam. He has home health. 02-24-2022 upon evaluation today patient appears to be doing better in regard to his wound. The sacral area is looking like it is trying to break down a little bit is not actually open right now but we definitely need to be careful in this regard. I discussed that with him today. With that being said the patient  tells me that he definitely is not changing positions as frequently as he should and I think that is the primary cause of what we are seeing here. Fortunately there does not appear to be any signs of infection locally or systemically at this time. No fevers, chills, nausea, vomiting, or diarrhea. 03-11-2022 upon evaluation today patient is actually making excellent improvement in general. I am extremely pleased with where things stand I do believe that he is headed on the right direction here. 03-25-2022 upon evaluation today patient appears to be doing well currently in regard to his wounds for the most part although the sacral area appears to be a little bit more broken down than what we would have expected. Fortunately there does not appear to be any signs of infection he tells me that he is not exactly sure what happened that caused this to breakdown to the degree that it is. Fortunately there does not appear to be any signs of active infection locally nor systemically at this time which is great news. No fevers, chills, nausea,  vomiting, or diarrhea. 04-22-2022 upon evaluation today patient's wound actually appears to be doing well on the left and the gluteal fold location. With that being said in regard to the sacral area this is open and a little bit more slough covered at this point. Fortunately I do not see any signs of infection locally nor systemically which is great news and overall I am extremely pleased with where things stand today. Overall I am very happy with where we are and I do think that he can get the sacral area he will get again but we need to keep a close eye on everything for sure. He does tell me he is good to be more aggressive with appropriate offloading which I think is definitely a good way to go. 05-06-2022 upon evaluation today patient appears to be doing well currently in regard to his wound on the sacral area though it is a little bit hyper granulated at this point unfortunately. Otherwise this seems to be doing overall decently well. In general I think that we are headed in the right direction. In regards to the wound in the left gluteal region this does have a little bit of depth to it but still seems to be showing signs of improvement which is great news and I am pleased in that regard. 05-20-2022 upon evaluation today patient appears to be doing better in regard to his wounds although he tells me he has been having to make to as he did not have the supplies ordered that he needed for dressing changes. Fortunately there does not appear to be any signs of active infection locally nor systemically which is great news. No fevers, chills, nausea, vomiting, or diarrhea. 06-03-2022 upon evaluation today patient actually appears to be doing excellent in fact his sacral wound is completely healed after the silver nitrate 2 weeks ago. Fortunately I do not see any signs of active infection locally nor systemically at this time. 06-24-2022 upon evaluation today patient actually appears to be making excellent  progress in regard to his wounds. He just has 1 left on the left hip/trochanter location and this 1 remaining spot is very tiny just a pinpoint. LARREN, PLAYFORD B ([id number]) [id number] Page 3 of 8 07-15-2022 upon evaluation today patient appears to be doing well with regard to his wounds. In fact the wound remaining is almost completely closed and is showing signs of significant improvement. I am very pleased  in that regard. There is no signs of active infection at this point. 07-29-2022 upon evaluation today patient appears to be doing well currently in regard to his wound. He has been tolerating the dressing changes without complication. Fortunately there does not appear to be any signs of active infection which is great news and overall I am extremely pleased with where things are currently. 08-26-2022 upon evaluation today patient appears to be doing well currently in regard to his wound. He has been tolerating the dressing changes without complication. Fortunately there does not appear to be any signs of active infection locally nor systemically which is great news. No fevers, chills, nausea, vomiting, or diarrhea. Electronic Signature(s) Signed: 08/26/2022 1:03:15 PM By: Allen Derry PA-C Entered By: Allen Derry on 08/26/2022 13:03:14 -------------------------------------------------------------------------------- Physical Exam Details Patient Name: Date of Service: VETO, VANNI 08/26/2022 12:15 PM Medical Record Number: [id number] Patient Account Number: [id number] Date of Birth/Sex: Treating RN: 12-Oct-1965 (57 y.o. [name] Primary Care Provider: Pearson Grippe Other Clinician: Referring Provider: Treating Provider/Extender: Cleotis Lema in Treatment: 45 Constitutional Well-nourished and well-hydrated in no acute distress. Respiratory normal breathing without difficulty. Psychiatric this patient is able to make decisions  and demonstrates good insight into disease process. Alert and Oriented x 3. pleasant and cooperative. Notes Fortunately I do not see Any signs of active infection which is great news and overall I do believe that we are making some really good progress here. I do think switching from [facility] to collagen may be a good way to go at this point. Upon inspection patient's wound bed showed evidence of good granulation and epithelization at this point. Electronic Signature(s) Signed: 08/26/2022 1:03:40 PM By: Allen Derry PA-C Entered By: Allen Derry on 08/26/2022 13:03:40 -------------------------------------------------------------------------------- Physician Orders Details Patient Name: Date of Service: [address], [name] 08/26/2022 12:15 PM Medical Record Number: [id number] Patient Account Number: [id number] Date of Birth/Sex: Treating RN: [birth year] (57 y.o. [name] Primary Care Provider: Pearson Grippe Other Clinician: [name] ([id number]) [id number] Page 4 of 8 Referring Provider: Treating Provider/Extender: Cleotis Lema in Treatment: [id number] Verbal / Phone Orders: No Diagnosis Coding ICD-10 Coding Code Description L89.153 Pressure ulcer of sacral region, stage 3 L89.893 Pressure ulcer of other site, stage 3 G82.54 Quadriplegia, C5-C7 incomplete Z93.1 Gastrostomy status Follow-up Appointments Return Appointment in 2 weeks. Nurse Visit as needed Home Health Home Health Company: - [facility] for wound care. May utilize formulary equivalent dressing for wound treatment orders unless otherwise specified. Home Health Nurse may visit PRN to address patients wound care needs. Scheduled days for dressing changes to be completed; exception, patient has scheduled wound care visit that day. **Please direct any NON-WOUND related issues/requests for orders to patient's Primary Care Physician. **If current  dressing causes regression in wound condition, may D/C ordered dressing product/s and apply Normal Saline Moist Dressing daily until next Wound Healing Center or Other MD appointment. **Notify Wound Healing Center of regression in wound condition at [phone]. Bathing/ [employer] wounds with antibacterial soap and water. May shower; gently cleanse wound with antibacterial soap, rinse and pat dry prior to dressing wounds No tub bath. Off-Loading Turn and reposition every 2 hours Other: - relive pressure from heels on bed Wound Treatment Wound #8 - Gluteus Wound Laterality: Left Cleanser: Soap and Water (Generic) 3 x Per Week/30 Days Discharge Instructions: Gently cleanse wound with antibacterial soap, rinse and pat dry prior to dressing  wounds Peri-Wound Care: Skin Prep 3 x Per Week/30 Days Discharge Instructions: Use skin prep as directed Prim Dressing: Prisma 4.34 (in) 3 x Per Week/30 Days ary Discharge Instructions: Moisten w/normal saline or sterile water; Cover wound as directed. Do not remove from wound bed. Secondary Dressing: (BORDER) Zetuvit Plus SILICONE BORDER Dressing 5x5 (in/in) (Home Health) (Dispense As Written) 3 x Per Week/30 Days Discharge Instructions: Please do not put silicone bordered dressings under wraps. Use non-bordered dressing only. Electronic Signature(s) Signed: 08/26/2022 4:38:31 PM By: Allen Derry PA-C Signed: 08/26/2022 5:01:13 PM By: Midge Aver MSN RN CNS WTA Entered By: Midge Aver on 08/26/2022 12:49:14 -------------------------------------------------------------------------------- Problem List Details Patient Name: Date of Service: [name], [name] 08/26/2022 12:15 PM Medical Record Number: [id number] Patient Account Number: [id number] Date of Birth/Sex: Treating RN: 16-Jun-1965 (57 y.o. [name] Primary Care Provider: Pearson Grippe Other Clinician: Referring Provider: Treating Provider/Extender: Lia Foyer Dell, Harbert B ([id number]) [id number] Page 5 of 8 Weeks in Treatment: 45 Active Problems ICD-10 Encounter Code Description Active Date MDM Diagnosis L89.153 Pressure ulcer of sacral region, stage 3 10/15/2021 No Yes L89.893 Pressure ulcer of other site, stage 3 10/15/2021 No Yes G82.54 Quadriplegia, C5-C7 incomplete 10/15/2021 No Yes Z93.1 Gastrostomy status 10/15/2021 No Yes Inactive Problems Resolved Problems Electronic Signature(s) Signed: 08/26/2022 4:38:31 PM By: Allen Derry PA-C Signed: 08/26/2022 5:01:13 PM By: Midge Aver MSN RN CNS WTA Previous Signature: 08/26/2022 12:26:26 PM Version By: Allen Derry PA-C Entered By: Midge Aver on 08/26/2022 12:50:50 -------------------------------------------------------------------------------- Progress Note Details Patient Name: Date of Service: [name], [place] B. 08/26/2022 12:15 PM Medical Record Number: [id number] Patient Account Number: [id number] Date of Birth/Sex: Treating RN: 1965/12/10 (57 y.o. [name] Primary Care Provider: Pearson Grippe Other Clinician: Referring Provider: Treating Provider/Extender: Cleotis Lema in Treatment: 45 Subjective Chief Complaint Information obtained from Patient Sacral and back pressure ulcers History of Present Illness (HPI) The following HPI elements were documented for the patient's wound: Associated Signs and Symptoms: Patient has a history of confirmed osteomyelitis of the left foot according to MRI July 2019. 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 12/08/17 on evaluation today patient presents for initial evaluation and our clinic concerning issues he has been having with his feet bilaterally although the left more than right most recently. He was admitted to hospital where he was placed on IV vancomycin which actually he continue to utilize until discharge  and even when discharged continue to be on until around 16 August according to what he tells me. His PICC line was removed at that point. Nonetheless he has been seen in the wound care center in [facility] before transferring to [place] due to the fact that the doctor there left and they are no longer open. Nonetheless he does have significant osteomyelitis of the left foot noted on MRI which revealed significant issues with necrotic bone including a portion of the distal region of his left great toe which was felt to possibly either be missing as a result of amputation or potentially resorption. Either way he also had osteomyelitis noted of the digit, metatarsal region, cuneiform, and navicular bones. There is definite bone exposure noted externally at this point in time. In regard to the right foot he has issues with ulcerations here as well although he states that recently he's had no issues until this just blistered and reopened. DIMARCO, HOLSTAD B ([id number]) [id number]  Page 6 of 8 Apparently Xeroform was used in this has caused things to be somewhat more moist and open more according to the patient. He's otherwise been tolerating the dressing changes without complication using silver alginate dressings. He has no discomfort but does seem to be extremely stressed regarding the fact that he did see Dr. 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 The patient stated per notes reviewed that he did not want to have any surgery and therefore has a repeat appointment scheduled with the surgeon on 12/15/17. With all that being said he feels like that he really has not been alerted to what was going on in the severity that was until just recently and has a lot of questions in that regard. I'll be see I explained that seeing him for the first time today I cannot answer a lot of those questions that he has. 12/21/17 on evaluation today patient  actually appears to be doing a little better in regard to the right foot in particular. There is one area where there still definitive bone noted although the MRI which I did review today that we had ordered was negative for any evidence of osteomyelitis which is good news. Nonetheless there was some necrotic bone noted. Overall the patient appears to be doing fairly well however in regard to the right foot. Unfortunately he does have a new open area on the posterior aspect of his left leg as well as the continued area of necrosis noted in the central portion of the foot. He states that he's actually going to be sent for reevaluation specifically to a limb salvage clinic at [facility] he believes this is something that is primary care provider has been working with. They were just awaiting the results of the MRI. 01/08/18 on evaluation today patient actually appears to be doing maybe just a little better in regard to the overall appearance of his bilateral foot ulcers. In general this does not seem to have worsened at least which is good news. He still has evidence of bone noted on the surface of both wound areas which though dry appearing really has not otherwise changed at this time. He actually has an appointment at [facility] with the wound center to discuss limb salvage. He discusses with me at the last visit. Nonetheless he was somewhat upset today about a couple things one being that he stated that we did not put the order in for the protective dressing for his knees that we discussed last time. With that being said I had put in the order for the protective dressing in fact it was on his order sheet that was signed on 12/21/17. Nonetheless unfortunately it appears this was overlooked by home health and they told him if they had the order they could put the protective dressing on. Nonetheless they told him they did not have the order. Again I believe this was an oversight nothing intentional  on anyone's part nonetheless the order was there. Still I had explained to the patient sometimes insurance will not cover for the protective dressing even if it is ordered and this was discussed in the last visit. 01/25/18 on evaluation today patient presents for follow-up concerning his bilateral lower extremity ulcers. He has actually been doing about the same since I last saw him. We do have them in the room we can look at his gluteal region today as well to ensure that there is nothing open or if so  address the issue there. Nonetheless he was supposed to see you and see wound care/limb salvage prior to his visit with me today. Unfortunately when he arrived last time at their clinic he apparently was there on the wrong day the doctor was not even present that he was his to be seen. Nonetheless I had to be switched to a different day and the patient actually is going in the next week to see them. Assuming everything works out this should actually be tomorrow. Nonetheless if they take over care of that point then we will subsequently release care to them. Readmission: 10-15-2021 upon evaluation today patient appears to be doing somewhat poorly in regard to wounds over the left gluteal region. He also has areas in the midline sacrum although this is not quite as bad. There is however some evidence of necrotic bone noted at this point unfortunately. The question is whether this is just something working its way out or if it something that is actually actively infected at this point. We are going to have to evaluate that further little by little as we go with additional diagnostics. With that being said the patient tells me currently that what we are seeing him for has been open since around 09-05-2021 for the gluteal area on the left and in regard to the sacrum 10-01-2021 he states. With that being said he again is not too significant as far as the overall appearance of the wounds are concerned but again  with the bone exposed this is something that we may want to do at pathology and culture in regard to. 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 This is a gentleman whom I have previously seen back in 2019. The wound that we were taking care of back at that time is completely cleared. 7/25; 2-week follow-up. The patient has 3 small open areas in the lower sacrum and then an area over the left ischial tuberosity. Both of these are stage III wounds. He has been using Hydrofera Blue. 11-12-2021 upon evaluation today patient appears to be doing well currently in regard to his wounds. The distal sacral area is almost completely closed and looks to be doing great. In regard to the left gluteal region this is showing signs of improvement from a depth perspective size is about the same. Overall I am extremely pleased with where things stand compared to what we were previous. 12-03-2021 upon evaluation today patient appears to be doing well currently in regard to his wound. He is actually showing signs of improvement the sacral region and the right ischial/gluteal region is completely healed. The left ischial/gluteal region is still open but seems to be doing much better. 12-24-2021 upon evaluation today patient appears to be doing well currently in regard to some of his wounds others have reopened or are not doing quite as well. Overall though I think that he is on a good track he just may be needs to be a little bit better on appropriate offloading we discussed that today as well. 01-27-2022 upon evaluation today patient appears to be doing well currently in regard to his wound. He is not showing any signs of significant tissue breakdown but unfortunately the wound is larger than what it was last time I saw him. He tells me that this seems to be a complete shock based on what he is  hearing today. He tells  me that his son was telling him this has been doing well and that it was smaller and not really bleeding or draining much. Again this is definitely significantly larger than last time I saw him but does not appear to be obviously infected there is some undermining noted as well. In general I am unsure of what would have caused such a dramatic changes what he seems to feel has happened here but nonetheless indeed I do not feel like that this is doing nearly as well as last time I saw him. 11/6; his wound measured better today per our intake nurse using silver alginate and border foam although according to the patient were having trouble with supplies especially the border foam. He has home health. 02-24-2022 upon evaluation today patient appears to be doing better in regard to his wound. The sacral area is looking like it is trying to break down a little bit is not actually open right now but we definitely need to be careful in this regard. I discussed that with him today. With that being said the patient tells me that he definitely is not changing positions as frequently as he should and I think that is the primary cause of what we are seeing here. Fortunately there does not appear to be any signs of infection locally or systemically at this time. No fevers, chills, nausea, vomiting, or diarrhea. 03-11-2022 upon evaluation today patient is actually making excellent improvement in general. I am extremely pleased with where things stand I do believe that he is headed on the right direction here. 03-25-2022 upon evaluation today patient appears to be doing well currently in regard to his wounds for the most part although the sacral area appears to be a little bit more broken down than what we would have expected. Fortunately there does not appear to be any signs of infection he tells me that he is not exactly sure what happened that caused this to breakdown to the degree that it  is. Fortunately there does not appear to be any signs of active infection locally nor systemically at this time which is great news. No fevers, chills, nausea, vomiting, or diarrhea. 04-22-2022 upon evaluation today patient's wound actually appears to be doing well on the left and the gluteal fold location. With that being said in regard to the sacral area this is open and a little bit more slough covered at this point. Fortunately I do not see any signs of infection locally nor systemically which is great news and overall I am extremely pleased with where things stand today. Overall I am very happy with where we are and I do think that he can get the sacral area he will get again but we need to keep a close eye on everything for sure. He does tell me he is good to be more aggressive with appropriate offloading which I think is definitely a good way to go. 05-06-2022 upon evaluation today patient appears to be doing well currently in regard to his wound on the sacral area though it is a little bit hyper granulated at this point unfortunately. Otherwise this seems to be doing overall decently well. In general I think that we are headed in the right direction. In regards to the wound in the left gluteal region this does have a little bit of depth to it but still seems to be showing signs of improvement which is great news and I am pleased in that regard. 05-20-2022 upon evaluation today patient  appears to be doing better in regard to his wounds although he tells me he has been having to make to as he did not have the supplies ordered that he needed for dressing changes. Fortunately there does not appear to be any signs of active infection locally nor systemically which is great news. No fevers, chills, nausea, vomiting, or diarrhea. KORIN, SALADINO B ([id number]) [id number] Page 7 of 8 06-03-2022 upon evaluation today patient actually appears to be doing excellent in fact his  sacral wound is completely healed after the silver nitrate 2 weeks ago. Fortunately I do not see any signs of active infection locally nor systemically at this time. 06-24-2022 upon evaluation today patient actually appears to be making excellent progress in regard to his wounds. He just has 1 left on the left hip/trochanter location and this 1 remaining spot is very tiny just a pinpoint. 07-15-2022 upon evaluation today patient appears to be doing well with regard to his wounds. In fact the wound remaining is almost completely closed and is showing signs of significant improvement. I am very pleased in that regard. There is no signs of active infection at this point. 07-29-2022 upon evaluation today patient appears to be doing well currently in regard to his wound. He has been tolerating the dressing changes without complication. Fortunately there does not appear to be any signs of active infection which is great news and overall I am extremely pleased with where things are currently. 08-26-2022 upon evaluation today patient appears to be doing well currently in regard to his wound. He has been tolerating the dressing changes without complication. Fortunately there does not appear to be any signs of active infection locally nor systemically which is great news. No fevers, chills, nausea, vomiting, or diarrhea. Objective Constitutional Well-nourished and well-hydrated in no acute distress. Respiratory normal breathing without difficulty. Psychiatric this patient is able to make decisions and demonstrates good insight into disease process. Alert and Oriented x 3. pleasant and cooperative. General Notes: Fortunately I do not see Any signs of active infection which is great news and overall I do believe that we are making some really good progress here. I do think switching from [facility] to collagen may be a good way to go at this point. Upon inspection patient's wound bed showed evidence of good  granulation and epithelization at this point. Integumentary (Hair, Skin) Wound #8 status is Open. Original cause of wound was Pressure Injury. The date acquired was: 09/05/2021. The wound has been in treatment 45 weeks. The wound is located on the Left Gluteus. The wound measures 0.2cm length x 0.2cm width x 0.1cm depth; 0.031cm^2 area and 0.003cm^3 volume. There is Fat Layer (Subcutaneous Tissue) exposed. There is a medium amount of serosanguineous drainage noted. There is large (67-100%) pink granulation within the wound bed. There is a small (1-33%) amount of necrotic tissue within the wound bed. Assessment Active Problems ICD-10 Pressure ulcer of sacral region, stage 3 Pressure ulcer of other site, stage 3 Quadriplegia, C5-C7 incomplete Gastrostomy status Plan Follow-up Appointments: Return Appointment in 2 weeks. Nurse Visit as needed Home Health: Home Health Company: - [facility] for wound care. May utilize formulary equivalent dressing for wound treatment orders unless otherwise specified. Home Health Nurse may visit PRN to address patients wound care needs. Scheduled days for dressing changes to be completed; exception, patient has scheduled wound care visit that day. **Please direct any NON-WOUND related issues/requests for orders to patient's Primary Care Physician. **If current dressing  causes regression in wound condition, may D/C ordered dressing product/s and apply Normal Saline Moist Dressing daily until next Wound Healing Center or Other MD appointment. **Notify Wound Healing Center of regression in wound condition at [phone]. Bathing/ Shower/ Hygiene: Wash wounds with antibacterial soap and water. May shower; gently cleanse wound with antibacterial soap, rinse and pat dry prior to dressing wounds No tub bath. Off-Loading: Turn and reposition every 2 hours Other: - relive pressure from heels on bed WOUND #8: - Gluteus Wound Laterality:  Left Cleanser: Soap and Water (Generic) 3 x Per Week/30 Days Lucy, Sacre Medhansh B ([id number]) [phone].pdf Page 8 of 8 Discharge Instructions: Gently cleanse wound with antibacterial soap, rinse and pat dry prior to dressing wounds Peri-Wound Care: Skin Prep 3 x Per Week/30 Days Discharge Instructions: Use skin prep as directed Prim Dressing: Prisma 4.34 (in) 3 x Per Week/30 Days ary Discharge Instructions: Moisten w/normal saline or sterile water; Cover wound as directed. Do not remove from wound bed. Secondary Dressing: (BORDER) Zetuvit Plus SILICONE BORDER Dressing 5x5 (in/in) (Home Health) (Dispense As Written) 3 x Per Week/30 Days Discharge Instructions: Please do not put silicone bordered dressings under wraps. Use non-bordered dressing only. 1. I am good recommend based on what we are seeing that we have the patient go ahead and switch to a silver collagen dressing I think this is good to be the best way to go. 2. Also can recommend that we have the patient continue with the Zetuvit border foam dressing to cover. 3. And also can recommend continuing appropriate offloading is doing a good job with this I am open the collagen will be better. We will see patient back for reevaluation in 1 week here in the clinic. If anything worsens or changes patient will contact our office for additional recommendations. Electronic Signature(s) Signed: 08/26/2022 1:04:05 PM By: Allen Derry PA-C Entered By: Allen Derry on 08/26/2022 13:04:05 -------------------------------------------------------------------------------- SuperBill Details Patient Name: Date of Service: [name], [place] 08/26/2022 Medical Record Number: [id number] Patient Account Number: [id number] Date of Birth/Sex: Treating RN: 1965/09/21 (57 y.o. [name] Primary Care Provider: Pearson Grippe Other Clinician: Referring Provider: Treating Provider/Extender: Cleotis Lema in  Treatment: 45 Diagnosis Coding ICD-10 Codes Code Description [phone] Pressure ulcer of sacral region, stage 3 L89.893 Pressure ulcer of other site, stage 3 G82.54 Quadriplegia, C5-C7 incomplete Z93.1 Gastrostomy status Facility Procedures : CPT4 Code: [id number] Description: 99213 - WOUND CARE VISIT-LEV 3 EST PT Modifier: Quantity: 1 Physician Procedures : CPT4 Code Description Modifier [id number] 99213 - WC PHYS LEVEL 3 - EST PT ICD-10 Diagnosis Description L89.153 Pressure ulcer of sacral region, stage 3 L89.893 Pressure ulcer of other site, stage 3 G82.54 Quadriplegia, C5-C7 incomplete Z93.1 Gastrostomy  status Quantity: 1 Electronic Signature(s) Signed: 08/26/2022 1:04:31 PM By: Allen Derry PA-C Entered By: Allen Derry on 08/26/2022 13:04:31

## 2022-09-23 ENCOUNTER — Ambulatory Visit: Payer: Medicare Other | Admitting: Physician Assistant

## 2022-09-30 ENCOUNTER — Encounter: Payer: Medicare Other | Admitting: Physician Assistant

## 2022-09-30 DIAGNOSIS — L89153 Pressure ulcer of sacral region, stage 3: Secondary | ICD-10-CM | POA: Diagnosis not present

## 2022-09-30 NOTE — Progress Notes (Addendum)
[name], [name] ([id number]) [id number] Page 1 of 10 Visit Report for 09/30/2022 Chief Complaint Document Details Patient Name: Date of Service: [name], [name] 09/30/2022 12:15 PM Medical Record Number: [id number] Patient Account Number: [id number] Date of Birth/Sex: Treating RN: 05-25-1965 (57 y.o. [name] Primary Care Provider: Pearson [name] Other Clinician: Referring Provider: Treating Provider/Extender: [name] in Treatment: 50 Information Obtained from: Patient Chief Complaint Sacral and back pressure ulcers Electronic Signature(s) Signed: 09/30/2022 12:37:10 PM By: Colin Derry PA-C Entered By: [name] on 09/30/2022 12:37:10 -------------------------------------------------------------------------------- HPI Details Patient Name: Date of Service: [name], [name]. 09/30/2022 12:15 PM Medical Record Number: [id number] Patient Account Number: [id number] Date of Birth/Sex: Treating RN: [birth year] (57 y.o. [name] Primary Care Provider: Pearson [name] Other Clinician: Referring Provider: Treating Provider/Extender: [name] in Treatment: 50 History of Present Illness ssociated Signs and Symptoms: Patient has a history of confirmed osteomyelitis of the left foot according to MRI July 2019. 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 HPI Description: 12/08/17 on evaluation today patient presents for initial evaluation and our clinic concerning issues he has been having with his feet bilaterally although the left more than right most recently. He was admitted to hospital where he was placed on IV vancomycin which actually he continue to utilize until discharge and even when discharged continue to be on until around 16 August according to what he tells me. His PICC line was removed at that point. Nonetheless he has been  seen in the wound care center in [facility] before transferring to [place] due to the fact that the doctor there left and they are no longer open. Nonetheless he does have significant osteomyelitis of the left foot noted on MRI which revealed significant issues with necrotic bone including a portion of the distal region of his left great toe which was felt to possibly either be missing as a result of amputation or potentially resorption. Either way he also had osteomyelitis noted of the digit, metatarsal region, cuneiform, and navicular bones. There is definite bone exposure noted externally at this point in time. In regard to the right foot he has issues with ulcerations here as well although he states that recently he's had no issues until this just blistered and reopened. Apparently Xeroform was used in this has caused things to be somewhat more moist and open more according to the patient. He's otherwise been tolerating the dressing changes without complication using silver alginate dressings. He has no discomfort but does seem to be extremely stressed regarding the fact that he did see Dr. 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 The patient stated per notes reviewed that he did not want to have any surgery and therefore has a repeat appointment scheduled with the surgeon on 12/15/17. With all that being said he feels like that he really has not been alerted to what was going on in the severity that was until just recently and has a lot of questions in that regard. I'll be see I explained that seeing him for the first time today I cannot answer a lot of those questions that he has. [name] ([id number]) [id number] Page 2 of 10 12/21/17 on evaluation today patient actually appears to be doing a little better in regard to the right foot in particular. There is one area where there still definitive  bone noted although the MRI  which I did review today that we had ordered was negative for any evidence of osteomyelitis which is good news. Nonetheless there was some necrotic bone noted. Overall the patient appears to be doing fairly well however in regard to the right foot. Unfortunately he does have a new open area on the posterior aspect of his left leg as well as the continued area of necrosis noted in the central portion of the foot. He states that he's actually going to be sent for reevaluation specifically to a limb salvage clinic at [facility] he believes this is something that is primary care provider has been working with. They were just awaiting the results of the MRI. 01/08/18 on evaluation today patient actually appears to be doing maybe just a little better in regard to the overall appearance of his bilateral foot ulcers. In general this does not seem to have worsened at least which is good news. He still has evidence of bone noted on the surface of both wound areas which though dry appearing really has not otherwise changed at this time. He actually has an appointment at [facility] with the wound center to discuss limb salvage. He discusses with me at the last visit. Nonetheless he was somewhat upset today about a couple things one being that he stated that we did not put the order in for the protective dressing for his knees that we discussed last time. With that being said I had put in the order for the protective dressing in fact it was on his order sheet that was signed on 12/21/17. Nonetheless unfortunately it appears this was overlooked by home health and they told him if they had the order they could put the protective dressing on. Nonetheless they told him they did not have the order. Again I believe this was an oversight nothing intentional on anyone's part nonetheless the order was there. Still I had explained to the patient sometimes insurance will not cover for the protective dressing even if it is ordered  and this was discussed in the last visit. 01/25/18 on evaluation today patient presents for follow-up concerning his bilateral lower extremity ulcers. He has actually been doing about the same since I last saw him. We do have them in the room we can look at his gluteal region today as well to ensure that there is nothing open or if so address the issue there. Nonetheless he was supposed to see you and see wound care/limb salvage prior to his visit with me today. Unfortunately when he arrived last time at their clinic he apparently was there on the wrong day the doctor was not even present that he was his to be seen. Nonetheless I had to be switched to a different day and the patient actually is going in the next week to see them. Assuming everything works out this should actually be tomorrow. Nonetheless if they take over care of that point then we will subsequently release care to them. Readmission: 10-15-2021 upon evaluation today patient appears to be doing somewhat poorly in regard to wounds over the left gluteal region. He also has areas in the midline sacrum although this is not quite as bad. There is however some evidence of necrotic bone noted at this point unfortunately. The question is whether this is just something working its way out or if it something that is actually actively infected at this point. We are going to have to evaluate that further little by  little as we go with additional diagnostics. With that being said the patient tells me currently that what we are seeing him for has been open since around 09-05-2021 for the gluteal area on the left and in regard to the sacrum 10-01-2021 he states. With that being said he again is not too significant as far as the overall appearance of the wounds are concerned but again with the bone exposed this is something that we may want to do at pathology and culture in regard to. 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 This is a gentleman whom I have previously seen back in 2019. The wound that we were taking care of back at that time is completely cleared. 7/25; 2-week follow-up. The patient has 3 small open areas in the lower sacrum and then an area over the left ischial tuberosity. Both of these are stage III wounds. He has been using Hydrofera Blue. 11-12-2021 upon evaluation today patient appears to be doing well currently in regard to his wounds. The distal sacral area is almost completely closed and looks to be doing great. In regard to the left gluteal region this is showing signs of improvement from a depth perspective size is about the same. Overall I am extremely pleased with where things stand compared to what we were previous. 12-03-2021 upon evaluation today patient appears to be doing well currently in regard to his wound. He is actually showing signs of improvement the sacral region and the right ischial/gluteal region is completely healed. The left ischial/gluteal region is still open but seems to be doing much better. 12-24-2021 upon evaluation today patient appears to be doing well currently in regard to some of his wounds others have reopened or are not doing quite as well. Overall though I think that he is on a good track he just may be needs to be a little bit better on appropriate offloading we discussed that today as well. 01-27-2022 upon evaluation today patient appears to be doing well currently in regard to his wound. He is not showing any signs of significant tissue breakdown but unfortunately the wound is larger than what it was last time I saw him. He tells me that this seems to be a complete shock based on what he is hearing today. He tells me that his son was telling him this has been doing well and that it was smaller and not really bleeding or draining much. Again this is  definitely significantly larger than last time I saw him but does not appear to be obviously infected there is some undermining noted as well. In general I am unsure of what would have caused such a dramatic changes what he seems to feel has happened here but nonetheless indeed I do not feel like that this is doing nearly as well as last time I saw him. 11/6; his wound measured better today per our intake nurse using silver alginate and border foam although according to the patient were having trouble with supplies especially the border foam. He has home health. 02-24-2022 upon evaluation today patient appears to be doing better in regard to his wound. The sacral area is looking like it is trying to break down a little bit is not actually open right now but we definitely need to be careful in this regard. I discussed that with him today. With that being said the patient  tells me that he definitely is not changing positions as frequently as he should and I think that is the primary cause of what we are seeing here. Fortunately there does not appear to be any signs of infection locally or systemically at this time. No fevers, chills, nausea, vomiting, or diarrhea. 03-11-2022 upon evaluation today patient is actually making excellent improvement in general. I am extremely pleased with where things stand I do believe that he is headed on the right direction here. 03-25-2022 upon evaluation today patient appears to be doing well currently in regard to his wounds for the most part although the sacral area appears to be a little bit more broken down than what we would have expected. Fortunately there does not appear to be any signs of infection he tells me that he is not exactly sure what happened that caused this to breakdown to the degree that it is. Fortunately there does not appear to be any signs of active infection locally nor systemically at this time which is great news. No fevers, chills, nausea,  vomiting, or diarrhea. 04-22-2022 upon evaluation today patient's wound actually appears to be doing well on the left and the gluteal fold location. With that being said in regard to the sacral area this is open and a little bit more slough covered at this point. Fortunately I do not see any signs of infection locally nor systemically which is great news and overall I am extremely pleased with where things stand today. Overall I am very happy with where we are and I do think that he can get the sacral area he will get again but we need to keep a close eye on everything for sure. He does tell me he is good to be more aggressive with appropriate offloading which I think is definitely a good way to go. 05-06-2022 upon evaluation today patient appears to be doing well currently in regard to his wound on the sacral area though it is a little bit hyper granulated at this point unfortunately. Otherwise this seems to be doing overall decently well. In general I think that we are headed in the right direction. In regards to the wound in the left gluteal region this does have a little bit of depth to it but still seems to be showing signs of improvement which is great news and I am pleased in that regard. 05-20-2022 upon evaluation today patient appears to be doing better in regard to his wounds although he tells me he has been having to make to as he did not have the supplies ordered that he needed for dressing changes. Fortunately there does not appear to be any signs of active infection locally nor systemically which is great news. No fevers, chills, nausea, vomiting, or diarrhea. 06-03-2022 upon evaluation today patient actually appears to be doing excellent in fact his sacral wound is completely healed after the silver nitrate 2 weeks ago. Fortunately I do not see any signs of active infection locally nor systemically at this time. 06-24-2022 upon evaluation today patient actually appears to be making excellent  progress in regard to his wounds. He just has 1 left on the left hip/trochanter location and this 1 remaining spot is very tiny just a pinpoint. MALCUM, LAHUE [name] ([id number]) [id number] Page 3 of 10 07-15-2022 upon evaluation today patient appears to be doing well with regard to his wounds. In fact the wound remaining is almost completely closed and is showing signs of significant improvement. I am very pleased  in that regard. There is no signs of active infection at this point. 07-29-2022 upon evaluation today patient appears to be doing well currently in regard to his wound. He has been tolerating the dressing changes without complication. Fortunately there does not appear to be any signs of active infection which is great news and overall I am extremely pleased with where things are currently. 08-26-2022 upon evaluation today patient appears to be doing well currently in regard to his wound. He has been tolerating the dressing changes without complication. Fortunately there does not appear to be any signs of active infection locally nor systemically which is great news. No fevers, chills, nausea, vomiting, or diarrhea. 09-09-2022 upon evaluation today patient appears to be doing well currently in regard to his wound I think we are very close to healing his ulcer in regard to use of the collagen. He does have a little bit of breakdown around the edges of the wound I think some Calmoseptine over this area could be beneficial for him. Fortunately I do not see any evidence overall of worsening which is great news. No fevers, chills, nausea, vomiting, or diarrhea. 09-30-2022 upon evaluation today patient appears to be doing well currently in regard to his wound. He has been tolerating the dressing changes without complication. Fortunately I do not see any signs of infection in fact all the previous wounds are pretty much healed except for a little area of irritation  beside where he had the deeper wound previous. Fortunately I do not see any evidence of active infection locally or systemically at this time. Electronic Signature(s) Signed: 09/30/2022 1:13:58 PM By: Colin Derry PA-C Entered By: [name] on 09/30/2022 13:13:58 -------------------------------------------------------------------------------- Physical Exam Details Patient Name: Date of Service: [name], [name] 09/30/2022 12:15 PM Medical Record Number: [id number] Patient Account Number: [id number] Date of Birth/Sex: Treating RN: 1965/11/17 (57 y.o. [name] Primary Care Provider: Pearson [name] Other Clinician: Referring Provider: Treating Provider/Extender: [name] in Treatment: 50 Constitutional Well-nourished and well-hydrated in no acute distress. Respiratory normal breathing without difficulty. Psychiatric this patient is able to make decisions and demonstrates good insight into disease process. Alert and Oriented x 3. pleasant and cooperative. Notes Upon inspection patient's wound bed actually showed signs of good granulation epithelization and just a little irritation around the edges of the wound and the good news is I do not see any evidence of infection locally or systemically which is excellent. Electronic Signature(s) Signed: 09/30/2022 1:14:12 PM By: Colin Derry PA-C Entered By: [name] on 09/30/2022 13:14:12 [name] ([id number]) [id number] Page 4 of 10 -------------------------------------------------------------------------------- Physician Orders Details Patient Name: Date of Service: [name], [name] 09/30/2022 12:15 PM Medical Record Number: [id number] Patient Account Number: [id number] Date of Birth/Sex: Treating RN: Aug 25, 1965 (57 y.o. [name] Primary Care Provider: Pearson [name] Other Clinician: Referring Provider: Treating Provider/Extender: [name]  in Treatment: 75 Verbal / Phone Orders: No Diagnosis Coding ICD-10 Coding Code Description L89.153 Pressure ulcer of sacral region, stage 3 L89.893 Pressure ulcer of other site, stage 3 G82.54 Quadriplegia, C5-C7 incomplete Z93.1 Gastrostomy status Follow-up Appointments Return Appointment in 2 weeks. Nurse Visit as needed Home Health Home Health Company: - [facility] for wound care. May utilize formulary equivalent dressing for wound treatment orders unless otherwise specified. Home Health Nurse may visit PRN to address patients wound care needs. Scheduled days for dressing changes to be completed; exception, patient has scheduled wound  care visit that day. **Please direct any NON-WOUND related issues/requests for orders to patient's Primary Care Physician. **If current dressing causes regression in wound condition, may D/C ordered dressing product/s and apply Normal Saline Moist Dressing daily until next Wound Healing Center or Other MD appointment. **Notify Wound Healing Center of regression in wound condition at [phone]. Bathing/ [employer] wounds with antibacterial soap and water. May shower; gently cleanse wound with antibacterial soap, rinse and pat dry prior to dressing wounds No tub bath. Off-Loading Gel mattress overlay (Group 1) Low air-loss mattress (Group 2) A fluidized (Group 3) ir Turn and reposition every 2 hours Other: - relive pressure from heels on bed Wound Treatment Wound #8 - Gluteus Wound Laterality: Left Cleanser: Soap and Water (Generic) 3 x Per Week/30 Days Discharge Instructions: Gently cleanse wound with antibacterial soap, rinse and pat dry prior to dressing wounds Topical: Calmoseptine 3 x Per Week/30 Days Secondary Dressing: (BORDER) Zetuvit Plus SILICONE BORDER Dressing 5x5 (in/in) (DME) (Generic) 3 x Per Week/30 Days Discharge Instructions: Please do not put silicone bordered dressings under wraps. Use  non-bordered dressing only. Electronic Signature(s) Signed: 10/14/2022 7:05:09 PM By: Colin Derry PA-C Signed: 11/07/2022 11:12:46 AM By: Elliot Gurney BSN, RN, CWS, Kim RN, BSN Previous Signature: 09/30/2022 2:41:28 PM Version By: Midge Aver MSN RN CNS WTA Previous Signature: 09/30/2022 5:11:00 PM Version By: Colin Derry PA-C Entered By: Elliot Gurney BSN, RN, CWS, Kim on 10/14/2022 11:51:15 [name] ([id number]) [id number] Page 5 of 10 Prescription 09/30/2022 -------------------------------------------------------------------------------- Colin Palms PA-C Patient Name: Provider: February 28, 1966 [id number] Date of Birth: NPI#: Judie Petit [id number] Sex: DEA #: [phone] Phone #: License #: UPN: Patient Address: [address] [facility] [place], [place] [address] [facility] [address] [place], [place] [address] [phone] Allergies niacin; shrimp Provider's Orders Gel mattress overlay (Group 1) Hand Signature: Date(s): Prescription 09/30/2022 Colin Palms PA-C Patient Name: Provider: February 13, 1966 [id number] Date of Birth: NPI#: Judie Petit [id number] Sex: DEA #: [phone] Phone #: License #: UPN: Patient Address: [address] [facility] [place], [place] [address] [facility] [address] [place], [place] [address] [phone] Allergies niacin; shrimp Provider's Orders Low air-loss mattress (Group 2) Hand Signature: Date(s): Prescription 09/30/2022 Colin Palms PA-C Patient Name: Provider: 1965-05-06 [id number] Date of Birth: NPI#: Judie Petit [id number] Sex: DEA #: [phone] Phone #: License #: UPN: Patient Address: [address] [facility] [place], [place] [address]  [facility] [address] [place], [place] [address] [phone] Allergies niacin; shrimp Provider's Orders A fluidized (Group 3) ir Hand Signature: Date(s): Electronic Signature(s) Signed: 10/14/2022 7:05:09 PM By: Colin Derry PA-C Signed: 11/07/2022 11:12:46 AM By: Elliot Gurney, BSN, RN, CWS, Kim RN, BSN Entered By: Elliot Gurney, BSN, RN, CWS, Kim on 10/14/2022 11:51:15 [name] ([id number]) [id number] Page 6 of 10 -------------------------------------------------------------------------------- Problem List Details Patient Name: Date of Service: [name], [name] 09/30/2022 12:15 PM Medical Record Number: [id number] Patient Account Number: [id number] Date of Birth/Sex: Treating RN: 11-Jan-1966 (57 y.o. [name] Primary Care Provider: Pearson [name] Other Clinician: Referring Provider: Treating Provider/Extender: [name] in Treatment: 50 Active Problems ICD-10 Encounter Code Description Active Date MDM Diagnosis L89.153 Pressure ulcer of sacral region, stage 3 10/15/2021 No Yes L89.893 Pressure ulcer of other site, stage 3 10/15/2021 No Yes G82.54 Quadriplegia,  C5-C7 incomplete 10/15/2021 No Yes Z93.1 Gastrostomy status 10/15/2021 No Yes Inactive Problems Resolved Problems Electronic Signature(s) Signed: 09/30/2022 12:37:04 PM By: Colin Derry PA-C Entered By: [name] on 09/30/2022 12:37:03 -------------------------------------------------------------------------------- Progress Note Details Patient Name: Date of Service: [name], [name]. 09/30/2022 12:15 PM Medical Record Number: [id number] Patient Account Number: [id number] Date of Birth/Sex: Treating RN: Jun 09, 1965 (57 y.o. [name] Primary Care Provider: Pearson [name] Other Clinician: Referring Provider: Treating Provider/Extender: Lia Foyer [employer], Keyondre [name] ([id number])  [id number] Page 7 of 10 Weeks in Treatment: 50 Subjective Chief Complaint Information obtained from Patient Sacral and back pressure ulcers History of Present Illness (HPI) The following HPI elements were documented for the patient's wound: Associated Signs and Symptoms: Patient has a history of confirmed osteomyelitis of the left foot according to MRI July 2019. 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 12/08/17 on evaluation today patient presents for initial evaluation and our clinic concerning issues he has been having with his feet bilaterally although the left more than right most recently. He was admitted to hospital where he was placed on IV vancomycin which actually he continue to utilize until discharge and even when discharged continue to be on until around 16 August according to what he tells me. His PICC line was removed at that point. Nonetheless he has been seen in the wound care center in [facility] before transferring to [place] due to the fact that the doctor there left and they are no longer open. Nonetheless he does have significant osteomyelitis of the left foot noted on MRI which revealed significant issues with necrotic bone including a portion of the distal region of his left great toe which was felt to possibly either be missing as a result of amputation or potentially resorption. Either way he also had osteomyelitis noted of the digit, metatarsal region, cuneiform, and navicular bones. There is definite bone exposure noted externally at this point in time. In regard to the right foot he has issues with ulcerations here as well although he states that recently he's had no issues until this just blistered and reopened. Apparently Xeroform was used in this has caused things to be somewhat more moist and open more according to the patient. He's otherwise been tolerating  the dressing changes without complication using silver alginate dressings. He has no discomfort but does seem to be extremely stressed regarding the fact that he did see Dr. 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 The patient stated per notes reviewed that he did not want to have any surgery and therefore has a repeat appointment scheduled with the surgeon on 12/15/17. With all that being said he feels like that he really has not been alerted to what was going on in the severity that was until just recently and has a lot of questions in that regard. I'll be see I explained that seeing him for the first time today I cannot answer a lot of those questions that he has. 12/21/17 on evaluation today patient actually appears to be doing a little better in regard to the right foot in particular. There is one area where there still definitive bone noted although the MRI which I did review today that we had ordered was negative for any evidence of osteomyelitis which is good news. Nonetheless there was some necrotic bone noted. Overall the patient appears to be doing fairly well however  in regard to the right foot. Unfortunately he does have a new open area on the posterior aspect of his left leg as well as the continued area of necrosis noted in the central portion of the foot. He states that he's actually going to be sent for reevaluation specifically to a limb salvage clinic at [facility] he believes this is something that is primary care provider has been working with. They were just awaiting the results of the MRI. 01/08/18 on evaluation today patient actually appears to be doing maybe just a little better in regard to the overall appearance of his bilateral foot ulcers. In general this does not seem to have worsened at least which is good news. He still has evidence of bone noted on the surface of both wound areas which though dry appearing really has not otherwise  changed at this time. He actually has an appointment at [facility] with the wound center to discuss limb salvage. He discusses with me at the last visit. Nonetheless he was somewhat upset today about a couple things one being that he stated that we did not put the order in for the protective dressing for his knees that we discussed last time. With that being said I had put in the order for the protective dressing in fact it was on his order sheet that was signed on 12/21/17. Nonetheless unfortunately it appears this was overlooked by home health and they told him if they had the order they could put the protective dressing on. Nonetheless they told him they did not have the order. Again I believe this was an oversight nothing intentional on anyone's part nonetheless the order was there. Still I had explained to the patient sometimes insurance will not cover for the protective dressing even if it is ordered and this was discussed in the last visit. 01/25/18 on evaluation today patient presents for follow-up concerning his bilateral lower extremity ulcers. He has actually been doing about the same since I last saw him. We do have them in the room we can look at his gluteal region today as well to ensure that there is nothing open or if so address the issue there. Nonetheless he was supposed to see you and see wound care/limb salvage prior to his visit with me today. Unfortunately when he arrived last time at their clinic he apparently was there on the wrong day the doctor was not even present that he was his to be seen. Nonetheless I had to be switched to a different day and the patient actually is going in the next week to see them. Assuming everything works out this should actually be tomorrow. Nonetheless if they take over care of that point then we will subsequently release care to them. Readmission: 10-15-2021 upon evaluation today patient appears to be doing somewhat poorly in regard to wounds over the left  gluteal region. He also has areas in the midline sacrum although this is not quite as bad. There is however some evidence of necrotic bone noted at this point unfortunately. The question is whether this is just something working its way out or if it something that is actually actively infected at this point. We are going to have to evaluate that further little by little as we go with additional diagnostics. With that being said the patient tells me currently that what we are seeing him for has been open since around 09-05-2021 for the gluteal area on the left and in regard to the sacrum  10-01-2021 he states. With that being said he again is not too significant as far as the overall appearance of the wounds are concerned but again with the bone exposed this is something that we may want to do at pathology and culture in regard to. 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 This is a gentleman whom I have previously seen back in 2019. The wound that we were taking care of back at that time is completely cleared. 7/25; 2-week follow-up. The patient has 3 small open areas in the lower sacrum and then an area over the left ischial tuberosity. Both of these are stage III wounds. He has been using Hydrofera Blue. 11-12-2021 upon evaluation today patient appears to be doing well currently in regard to his wounds. The distal sacral area is almost completely closed and looks to be doing great. In regard to the left gluteal region this is showing signs of improvement from a depth perspective size is about the same. Overall I am extremely pleased with where things stand compared to what we were previous. 12-03-2021 upon evaluation today patient appears to be doing well currently in regard to his wound. He is actually showing signs of improvement the sacral region and the right ischial/gluteal region  is completely healed. The left ischial/gluteal region is still open but seems to be doing much better. 12-24-2021 upon evaluation today patient appears to be doing well currently in regard to some of his wounds others have reopened or are not doing quite as well. Overall though I think that he is on a good track he just may be needs to be a little bit better on appropriate offloading we discussed that today as well. 01-27-2022 upon evaluation today patient appears to be doing well currently in regard to his wound. He is not showing any signs of significant tissue breakdown but unfortunately the wound is larger than what it was last time I saw him. He tells me that this seems to be a complete shock based on what he is hearing today. He tells me that his son was telling him this has been doing well and that it was smaller and not really bleeding or draining much. Again this is definitely significantly larger than last time I saw him but does not appear to be obviously infected there is some undermining noted as well. In general I am unsure of what would have caused such a dramatic changes what he seems to feel has happened here but nonetheless indeed I do not feel like that this is doing nearly as well as last time I saw him. 11/6; his wound measured better today per our intake nurse using silver alginate and border foam although according to the patient were having trouble with supplies especially the border foam. He has home health. 02-24-2022 upon evaluation today patient appears to be doing better in regard to his wound. The sacral area is looking like it is trying to break down a little bit is not actually open right now but we definitely need to be careful in this regard. I discussed that with him today. With that being said the patient tells me that he definitely is not changing positions as frequently as he should and I think that is the primary cause of what we are seeing here. Fortunately there  does not appear to be any signs of infection locally or systemically  at this time. No fevers, chills, nausea, vomiting, or diarrhea. [name], [name] ([id number]) [id number] Page 8 of 10 03-11-2022 upon evaluation today patient is actually making excellent improvement in general. I am extremely pleased with where things stand I do believe that he is headed on the right direction here. 03-25-2022 upon evaluation today patient appears to be doing well currently in regard to his wounds for the most part although the sacral area appears to be a little bit more broken down than what we would have expected. Fortunately there does not appear to be any signs of infection he tells me that he is not exactly sure what happened that caused this to breakdown to the degree that it is. Fortunately there does not appear to be any signs of active infection locally nor systemically at this time which is great news. No fevers, chills, nausea, vomiting, or diarrhea. 04-22-2022 upon evaluation today patient's wound actually appears to be doing well on the left and the gluteal fold location. With that being said in regard to the sacral area this is open and a little bit more slough covered at this point. Fortunately I do not see any signs of infection locally nor systemically which is great news and overall I am extremely pleased with where things stand today. Overall I am very happy with where we are and I do think that he can get the sacral area he will get again but we need to keep a close eye on everything for sure. He does tell me he is good to be more aggressive with appropriate offloading which I think is definitely a good way to go. 05-06-2022 upon evaluation today patient appears to be doing well currently in regard to his wound on the sacral area though it is a little bit hyper granulated at this point unfortunately. Otherwise this seems to be doing overall decently well. In general  I think that we are headed in the right direction. In regards to the wound in the left gluteal region this does have a little bit of depth to it but still seems to be showing signs of improvement which is great news and I am pleased in that regard. 05-20-2022 upon evaluation today patient appears to be doing better in regard to his wounds although he tells me he has been having to make to as he did not have the supplies ordered that he needed for dressing changes. Fortunately there does not appear to be any signs of active infection locally nor systemically which is great news. No fevers, chills, nausea, vomiting, or diarrhea. 06-03-2022 upon evaluation today patient actually appears to be doing excellent in fact his sacral wound is completely healed after the silver nitrate 2 weeks ago. Fortunately I do not see any signs of active infection locally nor systemically at this time. 06-24-2022 upon evaluation today patient actually appears to be making excellent progress in regard to his wounds. He just has 1 left on the left hip/trochanter location and this 1 remaining spot is very tiny just a pinpoint. 07-15-2022 upon evaluation today patient appears to be doing well with regard to his wounds. In fact the wound remaining is almost completely closed and is showing signs of significant improvement. I am very pleased in that regard. There is no signs of active infection at this point. 07-29-2022 upon evaluation today patient appears to be doing well currently in regard to his wound. He has been tolerating the dressing changes without complication. Fortunately there does not appear  to be any signs of active infection which is great news and overall I am extremely pleased with where things are currently. 08-26-2022 upon evaluation today patient appears to be doing well currently in regard to his wound. He has been tolerating the dressing changes without complication. Fortunately there does not appear to be any  signs of active infection locally nor systemically which is great news. No fevers, chills, nausea, vomiting, or diarrhea. 09-09-2022 upon evaluation today patient appears to be doing well currently in regard to his wound I think we are very close to healing his ulcer in regard to use of the collagen. He does have a little bit of breakdown around the edges of the wound I think some Calmoseptine over this area could be beneficial for him. Fortunately I do not see any evidence overall of worsening which is great news. No fevers, chills, nausea, vomiting, or diarrhea. 09-30-2022 upon evaluation today patient appears to be doing well currently in regard to his wound. He has been tolerating the dressing changes without complication. Fortunately I do not see any signs of infection in fact all the previous wounds are pretty much healed except for a little area of irritation beside where he had the deeper wound previous. Fortunately I do not see any evidence of active infection locally or systemically at this time. Objective Constitutional Well-nourished and well-hydrated in no acute distress. Vitals Time Taken: 12:59 PM, Height: 73 in, Weight: 154 lbs, BMI: 20.3, Temperature: 97.5 F, Pulse: 99 bpm, Respiratory Rate: 18 breaths/min, Blood Pressure: 95/73 mmHg. Respiratory normal breathing without difficulty. Psychiatric this patient is able to make decisions and demonstrates good insight into disease process. Alert and Oriented x 3. pleasant and cooperative. General Notes: Upon inspection patient's wound bed actually showed signs of good granulation epithelization and just a little irritation around the edges of the wound and the good news is I do not see any evidence of infection locally or systemically which is excellent. Integumentary (Hair, Skin) Wound #8 status is Open. Original cause of wound was Pressure Injury. The date acquired was: 09/05/2021. The wound has been in treatment 50 weeks. The wound is  located on the Left Gluteus. The wound measures 1.5cm length x 0.5cm width x 0.1cm depth; 0.589cm^2 area and 0.059cm^3 volume. There is Fat Layer (Subcutaneous Tissue) exposed. There is a medium amount of serosanguineous drainage noted. There is large (67-100%) pink granulation within the wound bed. There is a small (1-33%) amount of necrotic tissue within the wound bed. Assessment Active Problems ICD-10 [name], [name] ([id number]) [id number] Page 9 of 10 Pressure ulcer of sacral region, stage 3 Pressure ulcer of other site, stage 3 Quadriplegia, C5-C7 incomplete Gastrostomy status Plan Follow-up Appointments: Return Appointment in 2 weeks. Nurse Visit as needed Home Health: Home Health Company: - [facility] for wound care. May utilize formulary equivalent dressing for wound treatment orders unless otherwise specified. Home Health Nurse may visit PRN to address patients wound care needs. Scheduled days for dressing changes to be completed; exception, patient has scheduled wound care visit that day. **Please direct any NON-WOUND related issues/requests for orders to patient's Primary Care Physician. **If current dressing causes regression in wound condition, may D/C ordered dressing product/s and apply Normal Saline Moist Dressing daily until next Wound Healing Center or Other MD appointment. **Notify Wound Healing Center of regression in wound condition at [phone]. Bathing/ Shower/ Hygiene: Wash wounds with antibacterial soap and water. May shower; gently cleanse wound with antibacterial soap, rinse and  pat dry prior to dressing wounds No tub bath. Off-Loading: Turn and reposition every 2 hours Other: - relive pressure from heels on bed WOUND #8: - Gluteus Wound Laterality: Left Cleanser: Soap and Water (Generic) 3 x Per Week/30 Days Discharge Instructions: Gently cleanse wound with antibacterial soap, rinse and pat dry prior  to dressing wounds Topical: Calmoseptine 3 x Per Week/30 Days Secondary Dressing: (BORDER) Zetuvit Plus SILICONE BORDER Dressing 5x5 (in/in) (DME) (Generic) 3 x Per Week/30 Days Discharge Instructions: Please do not put silicone bordered dressings under wraps. Use non-bordered dressing only. 1. I would recommend that we have the patient continue to monitor for any signs of infection or worsening. Based on what I am seeing I do believe that he is really making pretty good progress here I am hopeful that he will continue to show signs of improvement week by week. 2. I am going to recommend that we continue with the Calmoseptine followed by the Zetuvit to cover which has been doing well. 3. I am also going to recommend the patient should continue to offload is much as possible when he is trying to do a lot of back at this point and again he does seem to be doing a really good job. We will see patient back for reevaluation in 1 week here in the clinic. If anything worsens or changes patient will contact our office for additional recommendations. Electronic Signature(s) Signed: 09/30/2022 1:15:03 PM By: Colin Derry PA-C Entered By: [name] on 09/30/2022 13:15:03 -------------------------------------------------------------------------------- SuperBill Details Patient Name: Date of Service: [name], [place] 09/30/2022 Medical Record Number: [id number] Patient Account Number: [id number] Date of Birth/Sex: Treating RN: Apr 08, 1965 (57 y.o. [name] Primary Care Provider: Pearson [name] Other Clinician: Referring Provider: Treating Provider/Extender: [name] in Treatment: 50 Diagnosis Coding ICD-10 Codes Code Description [phone] Pressure ulcer of sacral region, stage 3 [name], [name] ([id number]) [phone].pdf Page 10 of 10 [phone] Pressure ulcer of other site, stage 3 G82.54 Quadriplegia, C5-C7 incomplete Z93.1 Gastrostomy  status Facility Procedures : CPT4 Code: [id number] Description: 99213 - WOUND CARE VISIT-LEV 3 EST PT Modifier: Quantity: 1 Physician Procedures : CPT4 Code Description Modifier [id number] 99213 - WC PHYS LEVEL 3 - EST PT ICD-10 Diagnosis Description L89.153 Pressure ulcer of sacral region, stage 3 L89.893 Pressure ulcer of other site, stage 3 G82.54 Quadriplegia, C5-C7 incomplete Z93.1 Gastrostomy  status Quantity: 1 Electronic Signature(s) Signed: 09/30/2022 1:15:16 PM By: Colin Derry PA-C Entered By: [name] on 09/30/2022 13:15:16

## 2022-09-30 NOTE — Progress Notes (Addendum)
Colin, DELBENE [name] ([id number]) [id number] Page 1 of 8 Visit Report for 09/30/2022 Arrival Information Details Patient Name: Date of Service: [name], [name] 09/30/2022 12:15 PM Medical Record Number: [id number] Patient Account Number: [id number] Date of Birth/Sex: Treating RN: 1965-10-01 (57 y.o. [name] Primary Care Boris Engelmann: Pearson Grippe Other Clinician: Referring Silus Lanzo: Treating Briseidy Spark/Extender: Cleotis Lema in Treatment: 50 Visit Information History Since Last Visit Added or deleted any medications: No Patient Arrived: Wheel Chair Has Dressing in Place as Prescribed: Yes Arrival Time: 12:41 Pain Present Now: No Accompanied By: self Transfer Assistance: Michiel Sites Lift Patient Identification Verified: Yes Secondary Verification Process Completed: Yes Patient Requires Transmission-Based Precautions: No Patient Has Alerts: No Electronic Signature(s) Signed: 09/30/2022 2:41:28 PM By: Midge Aver MSN RN CNS WTA Entered By: Midge Aver on 09/30/2022 12:59:27 -------------------------------------------------------------------------------- Clinic Level of Care Assessment Details Patient Name: Date of Service: [name], [name] 09/30/2022 12:15 PM Medical Record Number: [id number] Patient Account Number: [id number] Date of Birth/Sex: Treating RN: [birth year] (57 y.o. [name] Primary Care Lillyen Schow: Pearson Grippe Other Clinician: Referring Tykira Wachs: Treating Carol Theys/Extender: Cleotis Lema in Treatment: 50 Clinic Level of Care Assessment Items TOOL 4 Quantity Score X- 1 0 Use when only an EandM is performed on FOLLOW-UP visit ASSESSMENTS - Nursing Assessment / Reassessment X- 1 10 Reassessment of Co-morbidities (includes updates in patient status) X- 1 5 Reassessment of Adherence to Treatment Plan ASSESSMENTS - Wound and Skin A ssessment / Reassessment X - Simple Wound Assessment /  Reassessment - one wound 1 5 []  - 0 Complex Wound Assessment / Reassessment - multiple wounds [name] ([id number]) [id number] Page 2 of 8 []  - 0 Dermatologic / Skin Assessment (not related to wound area) ASSESSMENTS - Focused Assessment []  - 0 Circumferential Edema Measurements - multi extremities []  - 0 Nutritional Assessment / Counseling / Intervention []  - 0 Lower Extremity Assessment (monofilament, tuning fork, pulses) []  - 0 Peripheral Arterial Disease Assessment (using hand held doppler) ASSESSMENTS - Ostomy and/or Continence Assessment and Care []  - 0 Incontinence Assessment and Management []  - 0 Ostomy Care Assessment and Management (repouching, etc.) PROCESS - Coordination of Care X - Simple Patient / Family Education for ongoing care 1 15 []  - 0 Complex (extensive) Patient / Family Education for ongoing care X- 1 10 Staff obtains Consents, Records, T Results / Process Orders est []  - 0 Staff telephones HHA, Nursing Homes / Clarify orders / etc []  - 0 Routine Transfer to another Facility (non-emergent condition) []  - 0 Routine Hospital Admission (non-emergent condition) []  - 0 New Admissions / Manufacturing engineer / Ordering NPWT Apligraf, etc. , []  - 0 Emergency Hospital Admission (emergent condition) X- 1 10 Simple Discharge Coordination []  - 0 Complex (extensive) Discharge Coordination PROCESS - Special Needs []  - 0 Pediatric / Minor Patient Management []  - 0 Isolation Patient Management []  - 0 Hearing / Language / Visual special needs []  - 0 Assessment of Community assistance (transportation, D/C planning, etc.) []  - 0 Additional assistance / Altered mentation []  - 0 Support Surface(s) Assessment (bed, cushion, seat, etc.) INTERVENTIONS - Wound Cleansing / Measurement X - Simple Wound Cleansing - one wound 1 5 []  - 0 Complex Wound Cleansing - multiple wounds X- 1 5 Wound Imaging (photographs - any  number of wounds) []  - 0 Wound Tracing (instead of photographs) X- 1 5 Simple Wound Measurement - one wound []  - 0 Complex Wound Measurement - multiple wounds INTERVENTIONS -  Wound Dressings X - Small Wound Dressing one or multiple wounds 1 10 []  - 0 Medium Wound Dressing one or multiple wounds []  - 0 Large Wound Dressing one or multiple wounds []  - 0 Application of Medications - topical []  - 0 Application of Medications - injection INTERVENTIONS - Miscellaneous []  - 0 External ear exam []  - 0 Specimen Collection (cultures, biopsies, blood, body fluids, etc.) []  - 0 Specimen(s) / Culture(s) sent or taken to Lab for analysis [name], [name] ([id number]) [id number] Page 3 of 8 []  - 0 Patient Transfer (multiple staff / Nurse, adult / Similar devices) []  - 0 Simple Staple / Suture removal (25 or less) []  - 0 Complex Staple / Suture removal (26 or more) []  - 0 Hypo / Hyperglycemic Management (close monitor of Blood Glucose) []  - 0 Ankle / Brachial Index (ABI) - do not check if billed separately X- 1 5 Vital Signs Has the patient been seen at the hospital within the last three years: Yes Total Score: 85 Level Of Care: New/Established - Level 3 Electronic Signature(s) Signed: 09/30/2022 2:41:28 PM By: Midge Aver MSN RN CNS WTA Entered By: Midge Aver on 09/30/2022 13:11:20 -------------------------------------------------------------------------------- Lower Extremity Assessment Details Patient Name: Date of Service: [name], [name]. 09/30/2022 12:15 PM Medical Record Number: [id number] Patient Account Number: [id number] Date of Birth/Sex: Treating RN: December 06, 1965 (57 y.o. [name] Primary Care Ithiel Liebler: Pearson Grippe Other Clinician: Referring Dolph Tavano: Treating Trevyn Lumpkin/Extender: Cleotis Lema in Treatment: 50 Electronic Signature(s) Signed: 09/30/2022 2:41:28 PM By: Midge Aver MSN RN CNS WTA Entered By:  Midge Aver on 09/30/2022 13:00:57 -------------------------------------------------------------------------------- Multi Wound Chart Details Patient Name: Date of Service: [name], [name]. 09/30/2022 12:15 PM Medical Record Number: [id number] Patient Account Number: [id number] Date of Birth/Sex: Treating RN: 1965/11/05 (57 y.o. [name] Primary Care Odies Desa: Pearson Grippe Other Clinician: Referring Bexlee Bergdoll: Treating Rajiv Parlato/Extender: Cleotis Lema in Treatment: 50 Vital Signs Height(in): 73 Pulse(bpm): 99 Weight(lbs): 154 Blood Pressure(mmHg): 95/73 Body Mass Index(BMI): 20.3 Temperature(F): 97.5 Respiratory Rate(breaths/min): 18 [name], [name] ([id number]) [id number] Page 4 of 8 [8:Photos:] [N/A:N/A] Left Gluteus N/A N/A Wound Location: Pressure Injury N/A N/A Wounding Event: Pressure Ulcer N/A N/A Primary Etiology: Hypotension, Myocardial Infarction, N/A N/A Comorbid History: History of pressure wounds, Rheumatoid Arthritis, Paraplegia, Confinement Anxiety 09/05/2021 N/A N/A Date Acquired: 2 N/A N/A Weeks of Treatment: Open N/A N/A Wound Status: No N/A N/A Wound Recurrence: 1.5x0.5x0.1 N/A N/A Measurements L x W x D (cm) 0.589 N/A N/A A (cm) : rea 0.059 N/A N/A Volume (cm) : 89.30% N/A N/A % Reduction in A rea: 98.90% N/A N/A % Reduction in Volume: Category/Stage III N/A N/A Classification: Medium N/A N/A Exudate A mount: Serosanguineous N/A N/A Exudate Type: red, brown N/A N/A Exudate Color: Large (67-100%) N/A N/A Granulation A mount: Pink N/A N/A Granulation Quality: Small (1-33%) N/A N/A Necrotic A mount: Fat Layer (Subcutaneous Tissue): Yes N/A N/A Exposed Structures: None N/A N/A Epithelialization: Treatment Notes Electronic Signature(s) Signed: 09/30/2022 2:41:28 PM By: Midge Aver MSN RN CNS WTA Entered By: Midge Aver on 09/30/2022  13:01:13 -------------------------------------------------------------------------------- Multi-Disciplinary Care Plan Details Patient Name: Date of Service: [name], [name]. 09/30/2022 12:15 PM Medical Record Number: [id number] Patient Account Number: [id number] Date of Birth/Sex: Treating RN: [birth year] (57 y.o. [name] Primary Care Jamall Strohmeier: Pearson Grippe Other Clinician: Referring Maisey Deandrade: Treating Giordana Weinheimer/Extender: Cleotis Lema in Treatment: 50 Active Inactive Pressure Nursing Diagnoses: Knowledge deficit related  to causes and risk factors for pressure ulcer development Knowledge deficit related to management of pressures ulcers Potential for impaired tissue integrity related to pressure, friction, moisture, and shear Stukey, Joangel [name] ([id number]) [phone].pdf Page 5 of 8 Goals: Patient will remain free from development of additional pressure ulcers Date Initiated: 12/03/2021 Date Inactivated: 07/15/2022 Target Resolution Date: 12/03/2021 Goal Status: Met Patient/caregiver will verbalize risk factors for pressure ulcer development Date Initiated: 12/03/2021 Date Inactivated: 08/26/2022 Target Resolution Date: 09/05/2022 Goal Status: Met Patient/caregiver will verbalize understanding of pressure ulcer management Date Initiated: 12/03/2021 Target Resolution Date: 09/17/2022 Goal Status: Active Interventions: Assess: immobility, friction, shearing, incontinence upon admission and as needed Assess offloading mechanisms upon admission and as needed Assess potential for pressure ulcer upon admission and as needed Provide education on pressure ulcers Notes: Wound/Skin Impairment Nursing Diagnoses: Impaired tissue integrity Knowledge deficit related to ulceration/compromised skin integrity Goals: Ulcer/skin breakdown will have a volume reduction of 30% by week 4 Date Initiated: 10/15/2021 Date Inactivated: 07/29/2022 Target  Resolution Date: 11/12/2021 Goal Status: Unmet Unmet Reason: underlying conditions Ulcer/skin breakdown will have a volume reduction of 50% by week 8 Date Initiated: 10/15/2021 Date Inactivated: 07/29/2022 Target Resolution Date: 12/10/2021 Goal Status: Met Ulcer/skin breakdown will have a volume reduction of 80% by week 12 Date Initiated: 10/15/2021 Date Inactivated: 07/29/2022 Target Resolution Date: 01/07/2022 Goal Status: Met Ulcer/skin breakdown will heal within 14 weeks Date Initiated: 10/15/2021 Target Resolution Date: 10/04/2022 Goal Status: Active Interventions: Assess patient/caregiver ability to obtain necessary supplies Assess patient/caregiver ability to perform ulcer/skin care regimen upon admission and as needed Assess ulceration(s) every visit Provide education on ulcer and skin care Notes: documentation for wound 8 Electronic Signature(s) Signed: 09/30/2022 2:41:28 PM By: Midge Aver MSN RN CNS WTA Entered By: Midge Aver on 09/30/2022 13:12:52 -------------------------------------------------------------------------------- Pain Assessment Details Patient Name: Date of Service: [name], Lowell [name]. 09/30/2022 12:15 PM Medical Record Number: [id number] Patient Account Number: [id number] Date of Birth/Sex: Treating RN: 05/25/1965 (57 y.o. [name] Primary Care Jules Vidovich: Pearson Grippe Other Clinician: Referring Casson Catena: Treating Aveon Colquhoun/Extender: Cleotis Lema in Treatment: [address] [name] ([id number]) [id number] Page 6 of 8 Active Problems Location of Pain Severity and Description of Pain Patient Has Paino No Site Locations Pain Management and Medication Current Pain Management: Electronic Signature(s) Signed: 09/30/2022 2:41:28 PM By: Midge Aver MSN RN CNS WTA Entered By: Midge Aver on 09/30/2022 13:00:11 -------------------------------------------------------------------------------- Patient/Caregiver  Education Details Patient Name: Date of Service: [name] 6/25/2024andnbsp12:15 PM Medical Record Number: [id number] Patient Account Number: [id number] Date of Birth/Gender: Treating RN: 1965-06-10 (57 y.o. [name] Primary Care Physician: Pearson Grippe Other Clinician: Referring Physician: Treating Physician/Extender: Cleotis Lema in Treatment: 50 Education Assessment Education Provided To: Patient Education Topics Provided Wound/Skin Impairment: Handouts: Caring for Your Ulcer Methods: Explain/Verbal Responses: State content correctly Electronic Signature(s) Signed: 09/30/2022 2:41:28 PM By: Midge Aver MSN RN CNS WTA Entered By: Midge Aver on 09/30/2022 13:13:06 [name] ([id number]) [id number] Page 7 of 8 -------------------------------------------------------------------------------- Wound Assessment Details Patient Name: Date of Service: [name], [name] 09/30/2022 12:15 PM Medical Record Number: [id number] Patient Account Number: [id number] Date of Birth/Sex: Treating RN: 01/19/1966 (57 y.o. [name] Primary Care Shalandria Elsbernd: Pearson Grippe Other Clinician: Referring Efosa Treichler: Treating Daleigh Pollinger/Extender: Cleotis Lema in Treatment: 50 Wound Status Wound Number: 8 Primary Pressure Ulcer Etiology: Wound Location: Left Gluteus Wound Open Wounding Event: Pressure Injury Status: Date Acquired: 09/05/2021 Comorbid Hypotension, Myocardial  Infarction, History of pressure wounds, Weeks Of Treatment: 50 History: Rheumatoid Arthritis, Paraplegia, Confinement Anxiety Clustered Wound: No Photos Wound Measurements Length: (cm) 1.5 Width: (cm) 0.5 Depth: (cm) 0.1 Area: (cm) 0.589 Volume: (cm) 0.059 % Reduction in Area: 89.3% % Reduction in Volume: 98.9% Epithelialization: None Wound Description Classification: Category/Stage III Exudate Amount: Medium Exudate Type:  Serosanguineous Exudate Color: red, brown Foul Odor After Cleansing: No Slough/Fibrino No Wound Bed Granulation Amount: Large (67-100%) Exposed Structure Granulation Quality: Pink Fat Layer (Subcutaneous Tissue) Exposed: Yes Necrotic Amount: Small (1-33%) Electronic Signature(s) Signed: 09/30/2022 2:41:28 PM By: Midge Aver MSN RN CNS WTA Entered By: Midge Aver on 09/30/2022 13:00:45 [name] ([id number]) [id number] Page 8 of 8 -------------------------------------------------------------------------------- Vitals Details Patient Name: Date of Service: [name], [name] 09/30/2022 12:15 PM Medical Record Number: [id number] Patient Account Number: [id number] Date of Birth/Sex: Treating RN: 04/19/1965 (57 y.o. [name] Primary Care Tige Meas: Pearson Grippe Other Clinician: Referring Denice Cardon: Treating Jordani Nunn/Extender: Cleotis Lema in Treatment: 50 Vital Signs Time Taken: 12:59 Temperature (F): 97.5 Height (in): 73 Pulse (bpm): 99 Weight (lbs): 154 Respiratory Rate (breaths/min): 18 Body Mass Index (BMI): 20.3 Blood Pressure (mmHg): 95/73 Reference Range: 80 - 120 mg / dl Electronic Signature(s) Signed: 09/30/2022 2:41:28 PM By: Midge Aver MSN RN CNS WTA Entered By: Midge Aver on 09/30/2022 13:00:02

## 2022-10-14 ENCOUNTER — Emergency Department (HOSPITAL_COMMUNITY)
Admission: EM | Admit: 2022-10-14 | Discharge: 2022-10-15 | Disposition: A | Discharge status: Home / Self Care | Payer: Medicare Other | Attending: Emergency Medicine | Admitting: Emergency Medicine

## 2022-10-14 ENCOUNTER — Other Ambulatory Visit: Payer: Self-pay

## 2022-10-14 ENCOUNTER — Ambulatory Visit: Payer: Medicare Other | Admitting: Physician Assistant

## 2022-10-14 ENCOUNTER — Emergency Department (HOSPITAL_COMMUNITY): Payer: Medicare Other

## 2022-10-14 DIAGNOSIS — T83511A Infection and inflammatory reaction due to indwelling urethral catheter, initial encounter: Secondary | ICD-10-CM | POA: Diagnosis not present

## 2022-10-14 DIAGNOSIS — R6883 Chills (without fever): Secondary | ICD-10-CM

## 2022-10-14 DIAGNOSIS — N390 Urinary tract infection, site not specified: Secondary | ICD-10-CM | POA: Insufficient documentation

## 2022-10-14 LAB — COMPREHENSIVE METABOLIC PANEL
ALT: 19 U/L (ref 0–44)
AST: 19 U/L (ref 15–41)
Albumin: 4 g/dL (ref 3.5–5.0)
Alkaline Phosphatase: 97 U/L (ref 38–126)
Anion gap: 11 (ref 5–15)
BUN: 25 mg/dL — ABNORMAL HIGH (ref 6–20)
CO2: 21 mmol/L — ABNORMAL LOW (ref 22–32)
Calcium: 9.9 mg/dL (ref 8.9–10.3)
Chloride: 101 mmol/L (ref 98–111)
Creatinine, Ser: 0.6 mg/dL — ABNORMAL LOW (ref 0.61–1.24)
GFR, Estimated: >60 mL/min (ref >60)
Glucose, Bld: 94 mg/dL (ref 70–99)
Potassium: 3.8 mmol/L (ref 3.5–5.1)
Sodium: 133 mmol/L — ABNORMAL LOW (ref 135–145)
Total Bilirubin: 0.8 mg/dL (ref 0.3–1.2)
Total Protein: 8.3 g/dL — ABNORMAL HIGH (ref 6.5–8.1)

## 2022-10-14 LAB — CBC
HCT: 38.2 % — ABNORMAL LOW (ref 39.0–52.0)
Hemoglobin: 13 g/dL (ref 13.0–17.0)
MCH: 26.8 pg (ref 26.0–34.0)
MCHC: 34 g/dL (ref 30.0–36.0)
MCV: 78.8 fL — ABNORMAL LOW (ref 80.0–100.0)
Platelets: 393 10*3/uL (ref 150–400)
RBC: 4.85 MIL/uL (ref 4.22–5.81)
RDW: 14.8 % (ref 11.5–15.5)
WBC: 9.1 10*3/uL (ref 4.0–10.5)
nRBC: 0 % (ref 0.0–0.2)

## 2022-10-14 MED ORDER — SODIUM CHLORIDE 0.9 % IV BOLUS
1000.0000 mL | Freq: Once | INTRAVENOUS | Status: AC
  Administered 2022-10-14: 1000 mL via INTRAVENOUS

## 2022-10-14 NOTE — ED Triage Notes (Signed)
Pt arrives via [facility] from home c/o dizziness, anxiety, and chills.
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
## 2022-10-15 LAB — URINALYSIS, ROUTINE W REFLEX MICROSCOPIC
Bilirubin Urine: NEGATIVE
Glucose, UA: NEGATIVE mg/dL
Ketones, ur: 5 mg/dL — AB
Nitrite: POSITIVE — AB
Protein, ur: NEGATIVE mg/dL
Specific Gravity, Urine: 1.013 (ref 1.005–1.030)
WBC, UA: >50 WBC/hpf (ref 0–5)
pH: 5 (ref 5.0–8.0)

## 2022-10-15 MED ORDER — SULFAMETHOXAZOLE-TRIMETHOPRIM 800-160 MG PO TABS: 1.0000 | ORAL_TABLET | Freq: Two times a day (BID) | ORAL | 0 refills | Status: AC

## 2022-10-15 MED ORDER — SULFAMETHOXAZOLE-TRIMETHOPRIM 800-160 MG PO TABS
1.0000 | ORAL_TABLET | Freq: Once | ORAL | Status: AC
  Administered 2022-10-15: 1 via ORAL
  Filled 2022-10-15: qty 1

## 2022-10-15 NOTE — ED Notes (Signed)
Previous foley removed and new foley placed by previous nurse. Pt urine output appropriate at time of discharge.

## 2022-10-15 NOTE — Discharge Instructions (Addendum)
You were evaluated in the Emergency Department and after careful evaluation, we did not find any emergent condition requiring admission or further testing in the hospital.  Your exam/testing today was overall reassuring.  Symptoms may be due to a UTI.  Take the Bactrim antibiotic as directed.  Follow-up with your regular doctors.  Please return to the Emergency Department if you experience any worsening of your condition.  Thank you for allowing [place] to be a part of your care.

## 2022-10-17 LAB — CULTURE, BLOOD (ROUTINE X 2)
Culture: NO GROWTH
Special Requests: ADEQUATE

## 2022-10-17 LAB — URINE CULTURE: Culture: 60000 — AB

## 2022-10-18 ENCOUNTER — Telehealth (HOSPITAL_BASED_OUTPATIENT_CLINIC_OR_DEPARTMENT_OTHER): Payer: Self-pay | Admitting: *Deleted

## 2022-10-18 LAB — CULTURE, BLOOD (ROUTINE X 2)
Culture: NO GROWTH
Special Requests: ADEQUATE

## 2022-10-18 NOTE — Telephone Encounter (Signed)
Post ED Visit - Positive Culture Follow-up  Culture report reviewed by antimicrobial stewardship pharmacist: [facility] []  Enzo Bi, Pharm.D. []  Celedonio Miyamoto, [address].D., BCPS AQ-ID []  Garvin Fila, Pharm.D., BCPS []  Georgina Pillion, Pharm.D., BCPS []  [place], [address].D., BCPS, AAHIVP []  Estella Husk, Pharm.D., BCPS, AAHIVP []  Lysle Pearl, PharmD, BCPS []  Phillips Climes, PharmD, BCPS []  Agapito Games, PharmD, BCPS []  Verlan Friends, PharmD []  Mervyn Gay, PharmD, BCPS [x] Jerline Pain, PharmD  [facility] []  Len Childs, PharmD []  Greer Pickerel, PharmD []  Adalberto Cole, PharmD []  Perlie Gold, Rph []  Lonell Face) Jean Rosenthal, PharmD []  Earl Many, PharmD []  Junita Push, PharmD []  Dorna Leitz, PharmD []  Terrilee Files, PharmD []  Lynann Beaver, PharmD []  Keturah Barre, PharmD []  Loralee Pacas, PharmD []  Bernadene Person, PharmD   Positive urine culture Treated with Sulfamethoxazole-Trimethoprim, organism sensitive to the same and no further patient follow-up is required at this time.  Patsey Berthold 10/18/2022, 1:01 PM

## 2022-10-28 ENCOUNTER — Encounter: Payer: Medicare Other | Attending: Physician Assistant | Admitting: Internal Medicine

## 2022-10-28 DIAGNOSIS — Z8619 Personal history of other infectious and parasitic diseases: Secondary | ICD-10-CM | POA: Insufficient documentation

## 2022-10-28 DIAGNOSIS — G8254 Quadriplegia, C5-C7 incomplete: Secondary | ICD-10-CM | POA: Diagnosis not present

## 2022-10-28 DIAGNOSIS — Z6820 Body mass index (BMI) 20.0-20.9, adult: Secondary | ICD-10-CM | POA: Insufficient documentation

## 2022-10-28 DIAGNOSIS — Z931 Gastrostomy status: Secondary | ICD-10-CM | POA: Insufficient documentation

## 2022-10-28 DIAGNOSIS — E46 Unspecified protein-calorie malnutrition: Secondary | ICD-10-CM | POA: Diagnosis not present

## 2022-10-28 DIAGNOSIS — L89153 Pressure ulcer of sacral region, stage 3: Secondary | ICD-10-CM | POA: Diagnosis not present

## 2022-10-28 DIAGNOSIS — L89893 Pressure ulcer of other site, stage 3: Secondary | ICD-10-CM | POA: Diagnosis not present

## 2022-10-30 NOTE — Progress Notes (Signed)
RYVER, POBLETE B ([id number]) [id number] Page 1 of 7 Visit Report for 10/28/2022 Arrival Information Details Patient Name: Date of Service: JERED, HEINY 10/28/2022 12:15 PM Medical Record Number: [id number] Patient Account Number: [id number] Date of Birth/Sex: Treating RN: 28-Feb-1966 (57 y.o. [name] Primary Care Ronzell Laban: Pearson Grippe Other Clinician: Referring Virgia Kelner: Treating Jisselle Poth/Extender: RO BSO Dorris Carnes, MICHA EL Leanna Sato in Treatment: 54 Visit Information History Since Last Visit Added or deleted any medications: No Patient Arrived: Wheel Chair Any new allergies or adverse reactions: No Arrival Time: 12:44 Has Dressing in Place as Prescribed: Yes Accompanied By: self Pain Present Now: No Transfer Assistance: Hoyer Lift Patient Identification Verified: Yes Secondary Verification Process Completed: Yes Patient Requires Transmission-Based Precautions: No Patient Has Alerts: No Electronic Signature(s) Signed: 10/30/2022 4:43:06 PM By: Midge Aver MSN RN CNS WTA Entered By: Midge Aver on 10/28/2022 12:45:17 -------------------------------------------------------------------------------- Clinic Level of Care Assessment Details Patient Name: Date of Service: INEZ, ROSATO 10/28/2022 12:15 PM Medical Record Number: [id number] Patient Account Number: [id number] Date of Birth/Sex: Treating RN: [birth year] (57 y.o. [name] Primary Care Orlen Leedy: Pearson Grippe Other Clinician: Referring Mili Piltz: Treating Demarques Pilz/Extender: RO BSO Dorris Carnes, MICHA EL Leanna Sato in Treatment: 54 Clinic Level of Care Assessment Items TOOL 1 Quantity Score []  - 0 Use when EandM and Procedure is performed on INITIAL visit ASSESSMENTS - Nursing Assessment / Reassessment []  - 0 General Physical Exam (combine w/ comprehensive assessment (listed just below) when performed on new pt. evals) []  - 0 Comprehensive Assessment (HX, ROS,  Risk Assessments, Wounds Hx, etc.) ASSESSMENTS - Wound and Skin Assessment / Reassessment []  - 0 Dermatologic / Skin Assessment (not related to wound area) ASSESSMENTS - Ostomy and/or Continence Assessment and Care CASIMIR, Blas B ([id number]) [phone].pdf Page 2 of 7 []  - 0 Incontinence Assessment and Management []  - 0 Ostomy Care Assessment and Management (repouching, etc.) PROCESS - Coordination of Care []  - 0 Simple Patient / Family Education for ongoing care []  - 0 Complex (extensive) Patient / Family Education for ongoing care []  - 0 Staff obtains Chiropractor, Records, T Results / Process Orders est []  - 0 Staff telephones HHA, Nursing Homes / Clarify orders / etc []  - 0 Routine Transfer to another Facility (non-emergent condition) []  - 0 Routine Hospital Admission (non-emergent condition) []  - 0 New Admissions / Manufacturing engineer / Ordering NPWT Apligraf, etc. , []  - 0 Emergency Hospital Admission (emergent condition) PROCESS - Special Needs []  - 0 Pediatric / Minor Patient Management []  - 0 Isolation Patient Management []  - 0 Hearing / Language / Visual special needs []  - 0 Assessment of Community assistance (transportation, D/C planning, etc.) []  - 0 Additional assistance / Altered mentation []  - 0 Support Surface(s) Assessment (bed, cushion, seat, etc.) INTERVENTIONS - Miscellaneous []  - 0 External ear exam []  - 0 Patient Transfer (multiple staff / Nurse, adult / Similar devices) []  - 0 Simple Staple / Suture removal (25 or less) []  - 0 Complex Staple / Suture removal (26 or more) []  - 0 Hypo/Hyperglycemic Management (do not check if billed separately) []  - 0 Ankle / Brachial Index (ABI) - do not check if billed separately Has the patient been seen at the hospital within the last three years: Yes Total Score: 0 Level Of Care: ____ Electronic Signature(s) Signed: 10/30/2022 4:43:06 PM By: Midge Aver MSN RN CNS  WTA Entered By: Midge Aver on 10/28/2022 13:07:47 -------------------------------------------------------------------------------- Lower Extremity Assessment Details Patient Name:  Date of Service: KALAB, CAMPS 10/28/2022 12:15 PM Medical Record Number: [id number] Patient Account Number: [id number] Date of Birth/Sex: Treating RN: December 20, 1965 (57 y.o. [name] Primary Care Jaliza Seifried: Pearson Grippe Other Clinician: Referring Haylee Mcanany: Treating Cleatus [name]/Extender: RO BSO Dorris Carnes, MICHA EL Leanna Sato in Treatment: 54 Electronic Signature(s) Signed: 10/30/2022 4:43:06 PM By: Midge Aver MSN RN CNS Ethlyn Daniels, Akshath B ([id number]) Midge Aver MSN RN CNS Margarita Rana [phone].pdf Page 3 of 7 Signed: 10/30/2022 4:43:06 PM By: Margaret Pyle By: Midge Aver on 10/28/2022 12:52:41 -------------------------------------------------------------------------------- Multi Wound Chart Details Patient Name: Date of Service: [name], [name] 10/28/2022 12:15 PM Medical Record Number: [id number] Patient Account Number: [id number] Date of Birth/Sex: Treating RN: 30-Aug-1965 (57 y.o. [name] Primary Care Dayln Tugwell: Pearson Grippe Other Clinician: Referring Menelik Mcfarren: Treating Jaliyah Fotheringham/Extender: RO BSO N, MICHA EL Leanna Sato in Treatment: 54 Vital Signs Height(in): 73 Pulse(bpm): 94 Weight(lbs): 154 Blood Pressure(mmHg): 99/75 Body Mass Index(BMI): 20.3 Temperature(F): 97.6 Respiratory Rate(breaths/min): 18 [11:Photos:] [N/A:N/A] Midline Coccyx Left Gluteus N/A Wound Location: Pressure Injury Pressure Injury N/A Wounding Event: Pressure Ulcer Pressure Ulcer N/A Primary Etiology: Hypotension, Myocardial Infarction, Hypotension, Myocardial Infarction, N/A Comorbid History: History of pressure wounds, History of pressure wounds, Rheumatoid Arthritis, Paraplegia, Rheumatoid Arthritis, Paraplegia, Confinement Anxiety Confinement Anxiety 10/21/2022  09/05/2021 N/A Date Acquired: 0 54 N/A Weeks of Treatment: Open Open N/A Wound Status: No No N/A Wound Recurrence: 3.5x4.3x0.1 0.1x0.1x0.1 N/A Measurements L x W x D (cm) 11.82 0.008 N/A A (cm) : rea 1.182 0.001 N/A Volume (cm) : N/A 99.90% N/A % Reduction in A rea: N/A 100.00% N/A % Reduction in Volume: Category/Stage III Category/Stage III N/A Classification: Medium Medium N/A Exudate A mount: Serosanguineous Serosanguineous N/A Exudate Type: red, brown red, brown N/A Exudate Color: Medium (34-66%) Large (67-100%) N/A Granulation A mount: Red, Pink Pink N/A Granulation Quality: Medium (34-66%) Small (1-33%) N/A Necrotic A mount: Fat Layer (Subcutaneous Tissue): Yes Fat Layer (Subcutaneous Tissue): Yes N/A Exposed Structures: Fascia: No Tendon: No Muscle: No Joint: No Bone: No N/A None N/A Epithelialization: Treatment Notes Electronic Signature(sJabreel Chimento, Brayton B ([id number]) [id number] Page 4 of 7 Signed: 10/30/2022 4:43:06 PM By: Midge Aver MSN RN CNS WTA Entered By: Midge Aver on 10/28/2022 12:53:22 -------------------------------------------------------------------------------- Pain Assessment Details Patient Name: Date of Service: STEADMAN, PROSPERI 10/28/2022 12:15 PM Medical Record Number: [id number] Patient Account Number: [id number] Date of Birth/Sex: Treating RN: 13-Jun-1965 (57 y.o. [name] Primary Care Fannie Gathright: Pearson Grippe Other Clinician: Referring Camarie Mctigue: Treating Sarahlynn Cisnero/Extender: RO BSO Dorris Carnes, MICHA EL Leanna Sato in Treatment: 22 Active Problems Location of Pain Severity and Description of Pain Patient Has Paino No Site Locations Pain Management and Medication Current Pain Management: Electronic Signature(s) Signed: 10/30/2022 4:43:06 PM By: Midge Aver MSN RN CNS WTA Entered By: Midge Aver on 10/28/2022  12:45:50 -------------------------------------------------------------------------------- Wound Assessment Details Patient Name: Date of Service: [name], Fredrick B. 10/28/2022 12:15 PM Medical Record Number: [id number] Patient Account Number: [id number] DIONIS, AUTRY ([id number]) [phone].pdf Page 5 of 7 Date of Birth/Sex: Treating RN: 1966/01/02 (57 y.o. [name] Primary Care Alben Jepsen: Pearson Grippe Other Clinician: Referring Lorelei Heikkila: Treating Ysabelle Goodroe/Extender: RO BSO N, MICHA EL Leanna Sato in Treatment: 54 Wound Status Wound Number: 11 Primary Pressure Ulcer Etiology: Wound Location: Midline Coccyx Wound Open Wounding Event: Pressure Injury Status: Date Acquired: 10/21/2022 Comorbid Hypotension, Myocardial Infarction, History of pressure wounds, Weeks Of Treatment: 0 History: Rheumatoid Arthritis, Paraplegia, Confinement Anxiety Clustered  Wound: No Photos Wound Measurements Length: (cm) Width: (cm) Depth: (cm) Area: (cm) Volume: (cm) 3.5 % Reduction in Area: 4.3 % Reduction in Volume: 0.1 11.82 1.182 Wound Description Classification: Category/Stage III Exudate Amount: Medium Exudate Type: Serosanguineous Exudate Color: red, brown Foul Odor After Cleansing: No Slough/Fibrino Yes Wound Bed Granulation Amount: Medium (34-66%) Exposed Structure Granulation Quality: Red, Pink Fascia Exposed: No Necrotic Amount: Medium (34-66%) Fat Layer (Subcutaneous Tissue) Exposed: Yes Necrotic Quality: Adherent Slough Tendon Exposed: No Muscle Exposed: No Joint Exposed: No Bone Exposed: No Electronic Signature(s) Signed: 10/30/2022 4:43:06 PM By: Midge Aver MSN RN CNS WTA Entered By: Midge Aver on 10/28/2022 12:52:10 -------------------------------------------------------------------------------- Wound Assessment Details Patient Name: Date of Service: [name], Talon B. 10/28/2022 12:15 PM Medical Record Number:  [id number] Patient Account Number: [id number] Date of Birth/Sex: Treating RN: 01-03-1966 (56 y.o. [name] Primary Care Tramaine Sauls: Pearson Grippe Other Clinician: [name] ([id number]) [id number] Page 6 of 7 Referring Khamarion Bjelland: Treating Lynden Flemmer/Extender: RO BSO N, MICHA EL Leanna Sato in Treatment: 54 Wound Status Wound Number: 8 Primary Pressure Ulcer Etiology: Wound Location: Left Gluteus Wound Open Wounding Event: Pressure Injury Status: Date Acquired: 09/05/2021 Comorbid Hypotension, Myocardial Infarction, History of pressure wounds, Weeks Of Treatment: 54 History: Rheumatoid Arthritis, Paraplegia, Confinement Anxiety Clustered Wound: No Photos Wound Measurements Length: (cm) 0.1 Width: (cm) 0.1 Depth: (cm) 0.1 Area: (cm) 0.008 Volume: (cm) 0.001 % Reduction in Area: 99.9% % Reduction in Volume: 100% Epithelialization: None Wound Description Classification: Category/Stage III Exudate Amount: Medium Exudate Type: Serosanguineous Exudate Color: red, brown Foul Odor After Cleansing: No Slough/Fibrino No Wound Bed Granulation Amount: Large (67-100%) Exposed Structure Granulation Quality: Pink Fat Layer (Subcutaneous Tissue) Exposed: Yes Necrotic Amount: Small (1-33%) Electronic Signature(s) Signed: 10/30/2022 4:43:06 PM By: Midge Aver MSN RN CNS WTA Entered By: Midge Aver on 10/28/2022 12:52:30 -------------------------------------------------------------------------------- Vitals Details Patient Name: Date of Service: [name], Mitcheal B. 10/28/2022 12:15 PM Medical Record Number: [id number] Patient Account Number: [id number] Date of Birth/Sex: Treating RN: Jul 21, 1965 (57 y.o. [name] Primary Care Skyleigh Windle: Pearson Grippe Other Clinician: Referring Kanika Bungert: Treating Lonnette Shrode/Extender: [name], MICHA EL Leanna Sato in Treatment: 54 Vital Signs Time Taken: 12:45 Temperature (F): 97.6 [place],  Antwane B ([id number]) [phone].pdf Page 7 of 7 Height (in): 73 Pulse (bpm): 94 Weight (lbs): 154 Respiratory Rate (breaths/min): 18 Body Mass Index (BMI): 20.3 Blood Pressure (mmHg): 99/75 Reference Range: 80 - 120 mg / dl Electronic Signature(s) Signed: 10/30/2022 4:43:06 PM By: Midge Aver MSN RN CNS WTA Entered By: Midge Aver on 10/28/2022 12:45:44

## 2022-10-30 NOTE — Progress Notes (Signed)
Colin, DOANE [name] ([id number]) [id number] Page 1 of 10 Visit Report for 10/28/2022 Debridement Details Patient Name: Date of Service: Colin, [name] 10/28/2022 12:15 PM Medical Record Number: [id number] Patient Account Number: [id number] Date of Birth/Sex: Treating RN: [birth year] (57 y.o. [name] Primary Care Provider: Pearson Grippe Other Clinician: Referring Provider: Treating Provider/Extender: RO BSO Dorris Carnes, MICHA EL Leanna Sato in Treatment: 54 Debridement Performed for Assessment: Wound #11 Midline Coccyx Performed By: Physician Maxwell Caul, MD Debridement Type: Debridement Level of Consciousness (Pre-procedure): Awake and Alert Pre-procedure Verification/Time Out Yes - 13:01 Taken: Start Time: 13:01 Percent of Wound Bed Debrided: 100% T Area Debrided (cm): otal 11.81 Tissue and other material debrided: Viable, Non-Viable, Slough, Subcutaneous, Slough Level: Skin/Subcutaneous Tissue Debridement Description: Excisional Instrument: Curette Bleeding: Moderate Hemostasis Achieved: Silver Nitrate Procedural Pain: 0 Post Procedural Pain: 0 Response to Treatment: Procedure was tolerated well Level of Consciousness (Post- Awake and Alert procedure): Post Debridement Measurements of Total Wound Length: (cm) 3.5 Stage: Category/Stage III Width: (cm) 4.3 Depth: (cm) 0.1 Volume: (cm) 1.182 Character of Wound/Ulcer Post Debridement: Stable Post Procedure Diagnosis Same as Pre-procedure Electronic Signature(s) Signed: 10/28/2022 4:43:46 PM By: Baltazar Najjar MD Signed: 10/30/2022 4:43:06 PM By: Midge Aver MSN RN CNS WTA Entered By: Midge Aver on 10/28/2022 13:02:51 HPI Details -------------------------------------------------------------------------------- [name] ([id number]) [id number] Page 2 of 10 Patient Name: Date of Service: Colin, [name] 10/28/2022 12:15  PM Medical Record Number: [id number] Patient Account Number: [id number] Date of Birth/Sex: Treating RN: [birth year] (57 y.o. [name] Primary Care Provider: Pearson Grippe Other Clinician: Referring Provider: Treating Provider/Extender: [name], MICHA EL Leanna Sato in Treatment: 54 History of Present Illness ssociated Signs and Symptoms: Patient has a history of confirmed osteomyelitis of the left foot according to MRI July 2019. 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 HPI Description: 12/08/17 on evaluation today patient presents for initial evaluation and our clinic concerning issues he has been having with his feet bilaterally although the left more than right most recently. He was admitted to hospital where he was placed on IV vancomycin which actually he continue to utilize until discharge and even when discharged continue to be on until around 16 August according to what he tells me. His PICC line was removed at that point. Nonetheless he has been seen in the wound care center in [facility] before transferring to [place] due to the fact that the doctor there left and they are no longer open. Nonetheless he does have significant osteomyelitis of the left foot noted on MRI which revealed significant issues with necrotic bone including a portion of the distal region of his left great toe which was felt to possibly either be missing as a result of amputation or potentially resorption. Either way he also had osteomyelitis noted of the digit, metatarsal region, cuneiform, and navicular bones. There is definite bone exposure noted externally at this point in time. In regard to the right foot he has issues with ulcerations here as well although he states that recently he's had no issues until this just blistered and reopened. Apparently Xeroform was used in this has caused things to be somewhat more moist and open  more according to the patient. He's otherwise been tolerating the dressing changes without complication using silver alginate dressings. He has no discomfort but does seem to be extremely stressed regarding the fact that  he did see Dr. 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 The patient stated per notes reviewed that he did not want to have any surgery and therefore has a repeat appointment scheduled with the surgeon on 12/15/17. With all that being said he feels like that he really has not been alerted to what was going on in the severity that was until just recently and has a lot of questions in that regard. I'll be see I explained that seeing him for the first time today I cannot answer a lot of those questions that he has. 12/21/17 on evaluation today patient actually appears to be doing a little better in regard to the right foot in particular. There is one area where there still definitive bone noted although the MRI which I did review today that we had ordered was negative for any evidence of osteomyelitis which is good news. Nonetheless there was some necrotic bone noted. Overall the patient appears to be doing fairly well however in regard to the right foot. Unfortunately he does have a new open area on the posterior aspect of his left leg as well as the continued area of necrosis noted in the central portion of the foot. He states that he's actually going to be sent for reevaluation specifically to a limb salvage clinic at [facility] he believes this is something that is primary care provider has been working with. They were just awaiting the results of the MRI. 01/08/18 on evaluation today patient actually appears to be doing maybe just a little better in regard to the overall appearance of his bilateral foot ulcers. In general this does not seem to have worsened at least which is good news. He still has evidence of bone noted on the surface of both wound  areas which though dry appearing really has not otherwise changed at this time. He actually has an appointment at [facility] with the wound center to discuss limb salvage. He discusses with me at the last visit. Nonetheless he was somewhat upset today about a couple things one being that he stated that we did not put the order in for the protective dressing for his knees that we discussed last time. With that being said I had put in the order for the protective dressing in fact it was on his order sheet that was signed on 12/21/17. Nonetheless unfortunately it appears this was overlooked by home health and they told him if they had the order they could put the protective dressing on. Nonetheless they told him they did not have the order. Again I believe this was an oversight nothing intentional on anyone's part nonetheless the order was there. Still I had explained to the patient sometimes insurance will not cover for the protective dressing even if it is ordered and this was discussed in the last visit. 01/25/18 on evaluation today patient presents for follow-up concerning his bilateral lower extremity ulcers. He has actually been doing about the same since I last saw him. We do have them in the room we can look at his gluteal region today as well to ensure that there is nothing open or if so address the issue there. Nonetheless he was supposed to see you and see wound care/limb salvage prior to his visit with me today. Unfortunately when he arrived last time at their clinic he apparently was there on the wrong day the doctor was not even present that he was his to be seen. Nonetheless  I had to be switched to a different day and the patient actually is going in the next week to see them. Assuming everything works out this should actually be tomorrow. Nonetheless if they take over care of that point then we will subsequently release care to them. Readmission: 10-15-2021 upon evaluation today patient appears to  be doing somewhat poorly in regard to wounds over the left gluteal region. He also has areas in the midline sacrum although this is not quite as bad. There is however some evidence of necrotic bone noted at this point unfortunately. The question is whether this is just something working its way out or if it something that is actually actively infected at this point. We are going to have to evaluate that further little by little as we go with additional diagnostics. With that being said the patient tells me currently that what we are seeing him for has been open since around 09-05-2021 for the gluteal area on the left and in regard to the sacrum 10-01-2021 he states. With that being said he again is not too significant as far as the overall appearance of the wounds are concerned but again with the bone exposed this is something that we may want to do at pathology and culture in regard to. 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 This is a gentleman whom I have previously seen back in 2019. The wound that we were taking care of back at that time is completely cleared. 7/25; 2-week follow-up. The patient has 3 small open areas in the lower sacrum and then an area over the left ischial tuberosity. Both of these are stage III wounds. He has been using Hydrofera Blue. 11-12-2021 upon evaluation today patient appears to be doing well currently in regard to his wounds. The distal sacral area is almost completely closed and looks to be doing great. In regard to the left gluteal region this is showing signs of improvement from a depth perspective size is about the same. Overall I am extremely pleased with where things stand compared to what we were previous. 12-03-2021 upon evaluation today patient appears to be doing well currently in regard to his wound. He is actually showing signs of  improvement the sacral region and the right ischial/gluteal region is completely healed. The left ischial/gluteal region is still open but seems to be doing much better. 12-24-2021 upon evaluation today patient appears to be doing well currently in regard to some of his wounds others have reopened or are not doing quite as well. Overall though I think that he is on a good track he just may be needs to be a little bit better on appropriate offloading we discussed that today as well. 01-27-2022 upon evaluation today patient appears to be doing well currently in regard to his wound. He is not showing any signs of significant tissue breakdown but unfortunately the wound is larger than what it was last time I saw him. He tells me that this seems to be a complete shock based on what he is hearing today. He tells me that his son was telling him this has been doing well and that it was smaller and not really bleeding or draining much. Again this is definitely significantly larger than last time I saw him but does not appear to be obviously infected there is some undermining noted as well. In general I  am unsure of what would have caused such a dramatic changes what he seems to feel has happened here but nonetheless indeed I do not feel like that this is doing nearly as well as last time I saw him. 11/6; his wound measured better today per our intake nurse using silver alginate and border foam although according to the patient were having trouble with supplies especially the border foam. He has home health. 02-24-2022 upon evaluation today patient appears to be doing better in regard to his wound. The sacral area is looking like it is trying to break down a little bit is not actually open right now but we definitely need to be careful in this regard. I discussed that with him today. With that being said the patient tells me that he definitely is not changing positions as frequently as he should and I think that  is the primary cause of what we are seeing here. Fortunately there does not appear to be any signs of infection locally or systemically at this time. No fevers, chills, nausea, vomiting, or diarrhea. ADELBERT, GASPARD [name] ([id number]) [id number] Page 3 of 10 03-11-2022 upon evaluation today patient is actually making excellent improvement in general. I am extremely pleased with where things stand I do believe that he is headed on the right direction here. 03-25-2022 upon evaluation today patient appears to be doing well currently in regard to his wounds for the most part although the sacral area appears to be a little bit more broken down than what we would have expected. Fortunately there does not appear to be any signs of infection he tells me that he is not exactly sure what happened that caused this to breakdown to the degree that it is. Fortunately there does not appear to be any signs of active infection locally nor systemically at this time which is great news. No fevers, chills, nausea, vomiting, or diarrhea. 04-22-2022 upon evaluation today patient's wound actually appears to be doing well on the left and the gluteal fold location. With that being said in regard to the sacral area this is open and a little bit more slough covered at this point. Fortunately I do not see any signs of infection locally nor systemically which is great news and overall I am extremely pleased with where things stand today. Overall I am very happy with where we are and I do think that he can get the sacral area he will get again but we need to keep a close eye on everything for sure. He does tell me he is good to be more aggressive with appropriate offloading which I think is definitely a good way to go. 05-06-2022 upon evaluation today patient appears to be doing well currently in regard to his wound on the sacral area though it is a little bit hyper granulated at this point unfortunately.  Otherwise this seems to be doing overall decently well. In general I think that we are headed in the right direction. In regards to the wound in the left gluteal region this does have a little bit of depth to it but still seems to be showing signs of improvement which is great news and I am pleased in that regard. 05-20-2022 upon evaluation today patient appears to be doing better in regard to his wounds although he tells me he has been having to make to as he did not have the supplies ordered that he needed for dressing changes. Fortunately there does not appear to be any signs  of active infection locally nor systemically which is great news. No fevers, chills, nausea, vomiting, or diarrhea. 06-03-2022 upon evaluation today patient actually appears to be doing excellent in fact his sacral wound is completely healed after the silver nitrate 2 weeks ago. Fortunately I do not see any signs of active infection locally nor systemically at this time. 06-24-2022 upon evaluation today patient actually appears to be making excellent progress in regard to his wounds. He just has 1 left on the left hip/trochanter location and this 1 remaining spot is very tiny just a pinpoint. 07-15-2022 upon evaluation today patient appears to be doing well with regard to his wounds. In fact the wound remaining is almost completely closed and is showing signs of significant improvement. I am very pleased in that regard. There is no signs of active infection at this point. 07-29-2022 upon evaluation today patient appears to be doing well currently in regard to his wound. He has been tolerating the dressing changes without complication. Fortunately there does not appear to be any signs of active infection which is great news and overall I am extremely pleased with where things are currently. 08-26-2022 upon evaluation today patient appears to be doing well currently in regard to his wound. He has been tolerating the dressing changes  without complication. Fortunately there does not appear to be any signs of active infection locally nor systemically which is great news. No fevers, chills, nausea, vomiting, or diarrhea. 09-09-2022 upon evaluation today patient appears to be doing well currently in regard to his wound I think we are very close to healing his ulcer in regard to use of the collagen. He does have a little bit of breakdown around the edges of the wound I think some Calmoseptine over this area could be beneficial for him. Fortunately I do not see any evidence overall of worsening which is great news. No fevers, chills, nausea, vomiting, or diarrhea. 09-30-2022 upon evaluation today patient appears to be doing well currently in regard to his wound. He has been tolerating the dressing changes without complication. Fortunately I do not see any signs of infection in fact all the previous wounds are pretty much healed except for a little area of irritation beside where he had the deeper wound previous. Fortunately I do not see any evidence of active infection locally or systemically at this time. 7/23; since the patient was last here the left buttock wound is actually healed however unfortunately we have had a reopening of the wound on the sacrum. He says this has been open for about 2 weeks he has been using some leftover Hydrofera Blue that his family is changing. He had managed to correct the issues he was having with his bed and his surface. Electronic Signature(s) Signed: 10/28/2022 4:43:46 PM By: Baltazar Najjar MD Entered By: Baltazar Najjar on 10/28/2022 13:15:15 -------------------------------------------------------------------------------- Physical Exam Details Patient Name: Date of Service: Colin Fearing [name]. 10/28/2022 12:15 PM Medical Record Number: [id number] Patient Account Number: [id number] Date of Birth/Sex: Treating RN: [birth year] (57 y.o. [name] Primary Care Provider: Pearson Grippe Other  Clinician: Referring Provider: Treating Provider/Extender: RO BSO Dorris Carnes, MICHA EL Leanna Sato in Treatment: 53 Constitutional Patient is hypotensive.However he appears stable. Pulse regular and within target range for patient.Marland Kitchen Respirations regular, non-labored and within target range.. Temperature is normal and within the target range for the patient.Marland Kitchen appears in no distress. NYHEIM, SEUFERT [name] ([id number]) [id number] Page 4 of 10 Notes Wound exam; The left buttock wound  is actually completely healed we carefully inspected this area over the ischial tuberosity Unfortunately the area on his lower sacrum/coccyx is widely reopened. There probably was not all lot of skin in this area to begin with certainly no subcutaneous tissue. Unfortunately the wound is wide open. Under illumination a very fibrinous gritty surface. I used a #3 curette to see if I can get some of this off. There is very little tissue covering bone in this area. Hemostasis with silver nitrate and a pressure dressing Electronic Signature(s) Signed: 10/28/2022 4:43:46 PM By: Baltazar Najjar MD Entered By: Baltazar Najjar on 10/28/2022 13:16:48 -------------------------------------------------------------------------------- Physician Orders Details Patient Name: Date of Service: [name], Carla [name]. 10/28/2022 12:15 PM Medical Record Number: [id number] Patient Account Number: [id number] Date of Birth/Sex: Treating RN: December 11, 1965 (57 y.o. [name] Primary Care Provider: Pearson Grippe Other Clinician: Referring Provider: Treating Provider/Extender: RO BSO Dorris Carnes, MICHA EL Leanna Sato in Treatment: 28 Verbal / Phone Orders: No Diagnosis Coding Follow-up Appointments Return Appointment in 2 weeks. Nurse Visit as needed Home Health Home Health Company: - [facility] for wound care. May utilize formulary equivalent dressing for wound treatment orders unless  otherwise specified. Home Health Nurse may visit PRN to address patients wound care needs. Scheduled days for dressing changes to be completed; exception, patient has scheduled wound care visit that day. **Please direct any NON-WOUND related issues/requests for orders to patient's Primary Care Physician. **If current dressing causes regression in wound condition, may D/C ordered dressing product/s and apply Normal Saline Moist Dressing daily until next Wound Healing Center or Other MD appointment. **Notify Wound Healing Center of regression in wound condition at [phone]. Bathing/ [employer] wounds with antibacterial soap and water. May shower; gently cleanse wound with antibacterial soap, rinse and pat dry prior to dressing wounds No tub bath. Off-Loading Gel mattress overlay (Group 1) Low air-loss mattress (Group 2) A fluidized (Group 3) ir Turn and reposition every 2 hours Other: - relive pressure from heels on bed Wound Treatment Wound #8 - Gluteus Wound Laterality: Left Cleanser: Soap and Water (Generic) 3 x Per Week/30 Days Discharge Instructions: Gently cleanse wound with antibacterial soap, rinse and pat dry prior to dressing wounds Prim Dressing: Hydrofera Blue Ready Transfer Foam, 2.5x2.5 (in/in) (DME) (Dispense As Written) 3 x Per Week/30 Days ary Discharge Instructions: Apply Hydrofera Blue Ready to wound bed as directed Secondary Dressing: (BORDER) Zetuvit Plus SILICONE BORDER Dressing 5x5 (in/in) (DME) (Generic) 3 x Per Week/30 Days Discharge Instructions: Please do not put silicone bordered dressings under wraps. Use non-bordered dressing only. Electronic Signature(s) Signed: 10/28/2022 4:43:46 PM By: Baltazar Najjar MD Signed: 10/30/2022 4:43:06 PM By: Midge Aver MSN RN CNS Ethlyn Daniels, Jakhai [name] ([id number]) [id number] Page 5 of 10 Entered By: Midge Aver on 10/28/2022 13:07:40 Prescription  10/28/2022 -------------------------------------------------------------------------------- Leda Gauze MD Patient Name: Provider: April 29, 1965 [id number] Date of Birth: NPI#: [id number] Sex: DEA #: [phone] Phone #: License #: [id number] UPN: Patient Address: [address] [facility] [place], [place] [address] [facility] [address], [address] [place], [place] [address] [phone] Allergies niacin; shrimp Provider's Orders Gel mattress overlay (Group 1) Hand Signature: Date(s): Prescription 10/28/2022 Leda Gauze MD Patient Name: Provider: 12/02/1965 [id number] Date of Birth: NPI#Judie Petit [id number] Sex: DEA #: [phone] Phone #: License #: [id number] UPN: Patient Address: [address] [facility] [place], [place] [address] [facility] [address] [place], [place] [address] [phone] Allergies niacin; shrimp Provider's Orders Low air-loss mattress (Group 2) Hand Signature: Date(s): Prescription 10/28/2022 Leda Gauze MD Patient Name: Provider: 1965-06-01 [id number] Date of Birth: NPI#: Judie Petit [id number] Sex: DEA #: [phone] Phone #: License #: [id number] UPN: Patient Address: [address] [facility] [place], [place] [address] [facility] [address], [address] [place], [place] [address] [phone] TAELON, BENDORF ([id number]) [id number] Page 6 of 10 Allergies niacin; shrimp Provider's Orders A fluidized (Group 3) ir Hand Signature: Date(s): Electronic Signature(s) Signed: 10/28/2022 4:43:46 PM By: Baltazar Najjar MD Signed: 10/30/2022 4:43:06 PM By: Midge Aver MSN RN CNS WTA Entered By: Midge Aver on 10/28/2022  13:07:41 -------------------------------------------------------------------------------- Problem List Details Patient Name: Date of Service: [name], [place] [name]. 10/28/2022 12:15 PM Medical Record Number: [id number] Patient Account Number: [id number] Date of Birth/Sex: Treating RN: [birth year] (57 y.o. [name] Primary Care Provider: Pearson Grippe Other Clinician: Referring Provider: Treating Provider/Extender: RO BSO Dorris Carnes, MICHA EL Leanna Sato in Treatment: 88 Active Problems ICD-10 Encounter Code Description Active Date MDM Diagnosis L89.153 Pressure ulcer of sacral region, stage 3 10/15/2021 No Yes L89.893 Pressure ulcer of other site, stage 3 10/15/2021 No Yes G82.54 Quadriplegia, C5-C7 incomplete 10/15/2021 No Yes Z93.1 Gastrostomy status 10/15/2021 No Yes Inactive Problems Resolved Problems Electronic Signature(s) Signed: 10/28/2022 4:43:46 PM By: Baltazar Najjar MD Entered By: Baltazar Najjar on 10/28/2022 13:14:09 [name] ([id number]) [id number] Page 7 of 10 -------------------------------------------------------------------------------- Progress Note Details Patient Name: Date of Service: [name], [name] 10/28/2022 12:15 PM Medical Record Number: [id number] Patient Account Number: [id number] Date of Birth/Sex: Treating RN: 07-Jul-1965 (57 y.o. [name] Primary Care Provider: Pearson Grippe Other Clinician: Referring Provider: Treating Provider/Extender: [name], MICHA EL Leanna Sato in Treatment: 54 Subjective History of Present Illness (HPI) The following HPI elements were documented for the patient's wound: Associated Signs and Symptoms: Patient has a history of confirmed osteomyelitis of the left foot according to MRI July 2019. 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 12/08/17 on evaluation today patient presents for initial  evaluation and our clinic concerning issues he has been having with his feet bilaterally although the left more than right most recently. He was admitted to hospital where he was placed on IV vancomycin which actually he continue to utilize until discharge and even when discharged continue to be on until around 16 August according to what he tells me. His PICC line was removed at that point. Nonetheless he has been seen in the wound care center in [facility] before transferring to [place] due to the fact that the doctor there left and they are no longer open. Nonetheless he does have significant osteomyelitis of the left foot noted on MRI which revealed significant issues with necrotic bone including a portion of the distal region of his left great toe which was felt to possibly either be missing as a result of amputation or potentially resorption. Either way he also had osteomyelitis noted of the digit, metatarsal region, cuneiform, and navicular bones. There is definite bone exposure noted externally at this point in time. In regard to the right foot he has issues with ulcerations here as well although he states that recently he's had no issues until this just blistered  and reopened. Apparently Xeroform was used in this has caused things to be somewhat more moist and open more according to the patient. He's otherwise been tolerating the dressing changes without complication using silver alginate dressings. He has no discomfort but does seem to be extremely stressed regarding the fact that he did see Dr. 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 The patient stated per notes reviewed that he did not want to have any surgery and therefore has a repeat appointment scheduled with the surgeon on 12/15/17. With all that being said he feels like that he really has not been alerted to what was going on in the severity that was until just recently and has a lot of questions in  that regard. I'll be see I explained that seeing him for the first time today I cannot answer a lot of those questions that he has. 12/21/17 on evaluation today patient actually appears to be doing a little better in regard to the right foot in particular. There is one area where there still definitive bone noted although the MRI which I did review today that we had ordered was negative for any evidence of osteomyelitis which is good news. Nonetheless there was some necrotic bone noted. Overall the patient appears to be doing fairly well however in regard to the right foot. Unfortunately he does have a new open area on the posterior aspect of his left leg as well as the continued area of necrosis noted in the central portion of the foot. He states that he's actually going to be sent for reevaluation specifically to a limb salvage clinic at [facility] he believes this is something that is primary care provider has been working with. They were just awaiting the results of the MRI. 01/08/18 on evaluation today patient actually appears to be doing maybe just a little better in regard to the overall appearance of his bilateral foot ulcers. In general this does not seem to have worsened at least which is good news. He still has evidence of bone noted on the surface of both wound areas which though dry appearing really has not otherwise changed at this time. He actually has an appointment at [facility] with the wound center to discuss limb salvage. He discusses with me at the last visit. Nonetheless he was somewhat upset today about a couple things one being that he stated that we did not put the order in for the protective dressing for his knees that we discussed last time. With that being said I had put in the order for the protective dressing in fact it was on his order sheet that was signed on 12/21/17. Nonetheless unfortunately it appears this was overlooked by home health and they told him if they had the  order they could put the protective dressing on. Nonetheless they told him they did not have the order. Again I believe this was an oversight nothing intentional on anyone's part nonetheless the order was there. Still I had explained to the patient sometimes insurance will not cover for the protective dressing even if it is ordered and this was discussed in the last visit. 01/25/18 on evaluation today patient presents for follow-up concerning his bilateral lower extremity ulcers. He has actually been doing about the same since I last saw him. We do have them in the room we can look at his gluteal region today as well to ensure that there is nothing open or if so address the  issue there. Nonetheless he was supposed to see you and see wound care/limb salvage prior to his visit with me today. Unfortunately when he arrived last time at their clinic he apparently was there on the wrong day the doctor was not even present that he was his to be seen. Nonetheless I had to be switched to a different day and the patient actually is going in the next week to see them. Assuming everything works out this should actually be tomorrow. Nonetheless if they take over care of that point then we will subsequently release care to them. Readmission: 10-15-2021 upon evaluation today patient appears to be doing somewhat poorly in regard to wounds over the left gluteal region. He also has areas in the midline sacrum although this is not quite as bad. There is however some evidence of necrotic bone noted at this point unfortunately. The question is whether this is just something working its way out or if it something that is actually actively infected at this point. We are going to have to evaluate that further little by little as we go with additional diagnostics. With that being said the patient tells me currently that what we are seeing him for has been open since around 09-05-2021 for the gluteal area on the left and in  regard to the sacrum 10-01-2021 he states. With that being said he again is not too significant as far as the overall appearance of the wounds are concerned but again with the bone exposed this is something that we may want to do at pathology and culture in regard to. 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 This is a gentleman whom I have previously seen back in 2019. The wound that we were taking care of back at that time is completely cleared. 7/25; 2-week follow-up. The patient has 3 small open areas in the lower sacrum and then an area over the left ischial tuberosity. Both of these are stage III wounds. He has been using Hydrofera Blue. 11-12-2021 upon evaluation today patient appears to be doing well currently in regard to his wounds. The distal sacral area is almost completely closed and looks to be doing great. In regard to the left gluteal region this is showing signs of improvement from a depth perspective size is about the same. Overall I am extremely pleased with where things stand compared to what we were previous. 12-03-2021 upon evaluation today patient appears to be doing well currently in regard to his wound. He is actually showing signs of improvement the sacral region and the right ischial/gluteal region is completely healed. The left ischial/gluteal region is still open but seems to be doing much better. MARICUS, TANZI [name] ([id number]) [id number] Page 8 of 10 12-24-2021 upon evaluation today patient appears to be doing well currently in regard to some of his wounds others have reopened or are not doing quite as well. Overall though I think that he is on a good track he just may be needs to be a little bit better on appropriate offloading we discussed that today as well. 01-27-2022 upon evaluation today patient appears to be doing well  currently in regard to his wound. He is not showing any signs of significant tissue breakdown but unfortunately the wound is larger than what it was last time I saw him. He tells me that this seems to be a complete shock based on  what he is hearing today. He tells me that his son was telling him this has been doing well and that it was smaller and not really bleeding or draining much. Again this is definitely significantly larger than last time I saw him but does not appear to be obviously infected there is some undermining noted as well. In general I am unsure of what would have caused such a dramatic changes what he seems to feel has happened here but nonetheless indeed I do not feel like that this is doing nearly as well as last time I saw him. 11/6; his wound measured better today per our intake nurse using silver alginate and border foam although according to the patient were having trouble with supplies especially the border foam. He has home health. 02-24-2022 upon evaluation today patient appears to be doing better in regard to his wound. The sacral area is looking like it is trying to break down a little bit is not actually open right now but we definitely need to be careful in this regard. I discussed that with him today. With that being said the patient tells me that he definitely is not changing positions as frequently as he should and I think that is the primary cause of what we are seeing here. Fortunately there does not appear to be any signs of infection locally or systemically at this time. No fevers, chills, nausea, vomiting, or diarrhea. 03-11-2022 upon evaluation today patient is actually making excellent improvement in general. I am extremely pleased with where things stand I do believe that he is headed on the right direction here. 03-25-2022 upon evaluation today patient appears to be doing well currently in regard to his wounds for the most part although the sacral area appears  to be a little bit more broken down than what we would have expected. Fortunately there does not appear to be any signs of infection he tells me that he is not exactly sure what happened that caused this to breakdown to the degree that it is. Fortunately there does not appear to be any signs of active infection locally nor systemically at this time which is great news. No fevers, chills, nausea, vomiting, or diarrhea. 04-22-2022 upon evaluation today patient's wound actually appears to be doing well on the left and the gluteal fold location. With that being said in regard to the sacral area this is open and a little bit more slough covered at this point. Fortunately I do not see any signs of infection locally nor systemically which is great news and overall I am extremely pleased with where things stand today. Overall I am very happy with where we are and I do think that he can get the sacral area he will get again but we need to keep a close eye on everything for sure. He does tell me he is good to be more aggressive with appropriate offloading which I think is definitely a good way to go. 05-06-2022 upon evaluation today patient appears to be doing well currently in regard to his wound on the sacral area though it is a little bit hyper granulated at this point unfortunately. Otherwise this seems to be doing overall decently well. In general I think that we are headed in the right direction. In regards to the wound in the left gluteal region this does have a little bit of depth to it but still seems to be showing signs of improvement which is great news and I am pleased in  that regard. 05-20-2022 upon evaluation today patient appears to be doing better in regard to his wounds although he tells me he has been having to make to as he did not have the supplies ordered that he needed for dressing changes. Fortunately there does not appear to be any signs of active infection locally nor systemically which is  great news. No fevers, chills, nausea, vomiting, or diarrhea. 06-03-2022 upon evaluation today patient actually appears to be doing excellent in fact his sacral wound is completely healed after the silver nitrate 2 weeks ago. Fortunately I do not see any signs of active infection locally nor systemically at this time. 06-24-2022 upon evaluation today patient actually appears to be making excellent progress in regard to his wounds. He just has 1 left on the left hip/trochanter location and this 1 remaining spot is very tiny just a pinpoint. 07-15-2022 upon evaluation today patient appears to be doing well with regard to his wounds. In fact the wound remaining is almost completely closed and is showing signs of significant improvement. I am very pleased in that regard. There is no signs of active infection at this point. 07-29-2022 upon evaluation today patient appears to be doing well currently in regard to his wound. He has been tolerating the dressing changes without complication. Fortunately there does not appear to be any signs of active infection which is great news and overall I am extremely pleased with where things are currently. 08-26-2022 upon evaluation today patient appears to be doing well currently in regard to his wound. He has been tolerating the dressing changes without complication. Fortunately there does not appear to be any signs of active infection locally nor systemically which is great news. No fevers, chills, nausea, vomiting, or diarrhea. 09-09-2022 upon evaluation today patient appears to be doing well currently in regard to his wound I think we are very close to healing his ulcer in regard to use of the collagen. He does have a little bit of breakdown around the edges of the wound I think some Calmoseptine over this area could be beneficial for him. Fortunately I do not see any evidence overall of worsening which is great news. No fevers, chills, nausea, vomiting, or  diarrhea. 09-30-2022 upon evaluation today patient appears to be doing well currently in regard to his wound. He has been tolerating the dressing changes without complication. Fortunately I do not see any signs of infection in fact all the previous wounds are pretty much healed except for a little area of irritation beside where he had the deeper wound previous. Fortunately I do not see any evidence of active infection locally or systemically at this time. 7/23; since the patient was last here the left buttock wound is actually healed however unfortunately we have had a reopening of the wound on the sacrum. He says this has been open for about 2 weeks he has been using some leftover Hydrofera Blue that his family is changing. He had managed to correct the issues he was having with his bed and his surface. Objective Constitutional Patient is hypotensive.However he appears stable. Pulse regular and within target range for patient.Marland Kitchen Respirations regular, non-labored and within target range.. Temperature is normal and within the target range for the patient.Marland Kitchen appears in no distress. Vitals Time Taken: 12:45 PM, Height: 73 in, Weight: 154 lbs, BMI: 20.3, Temperature: 97.6 F, Pulse: 94 bpm, Respiratory Rate: 18 breaths/min, Blood Pressure: 99/75 mmHg. General Notes: Wound exam;  The left buttock wound is actually completely healed  we carefully inspected this area over the ischial tuberosity  Everetts, Nero [name] ([id number]) [id number] Page 9 of 10 Unfortunately the area on his lower sacrum/coccyx is widely reopened. There probably was not all lot of skin in this area to begin with certainly no subcutaneous tissue. Unfortunately the wound is wide open. Under illumination a very fibrinous gritty surface. I used a #3 curette to see if I can get some of this off. There is very little tissue covering bone in this area. Hemostasis with silver nitrate and a pressure  dressing Integumentary (Hair, Skin) Wound #11 status is Open. Original cause of wound was Pressure Injury. The date acquired was: 10/21/2022. The wound is located on the Midline Coccyx. The wound measures 3.5cm length x 4.3cm width x 0.1cm depth; 11.82cm^2 area and 1.182cm^3 volume. There is Fat Layer (Subcutaneous Tissue) exposed. There is a medium amount of serosanguineous drainage noted. There is medium (34-66%) red, pink granulation within the wound bed. There is a medium (34-66%) amount of necrotic tissue within the wound bed including Adherent Slough. Wound #8 status is Open. Original cause of wound was Pressure Injury. The date acquired was: 09/05/2021. The wound has been in treatment 54 weeks. The wound is located on the Left Gluteus. The wound measures 0.1cm length x 0.1cm width x 0.1cm depth; 0.008cm^2 area and 0.001cm^3 volume. There is Fat Layer (Subcutaneous Tissue) exposed. There is a medium amount of serosanguineous drainage noted. There is large (67-100%) pink granulation within the wound bed. There is a small (1-33%) amount of necrotic tissue within the wound bed. Assessment Active Problems ICD-10 Pressure ulcer of sacral region, stage 3 Pressure ulcer of other site, stage 3 Quadriplegia, C5-C7 incomplete Gastrostomy status Procedures Wound #11 Pre-procedure diagnosis of Wound #11 is a Pressure Ulcer located on the Midline Coccyx . There was a Excisional Skin/Subcutaneous Tissue Debridement with a total area of 11.81 sq cm performed by Maxwell Caul, MD. With the following instrument(s): Curette to remove Viable and Non-Viable tissue/material. Material removed includes Subcutaneous Tissue and Slough and. No specimens were taken. A time out was conducted at 13:01, prior to the start of the procedure. A Moderate amount of bleeding was controlled with Silver Nitrate. The procedure was tolerated well with a pain level of 0 throughout and a pain level of 0 following the procedure.  Post Debridement Measurements: 3.5cm length x 4.3cm width x 0.1cm depth; 1.182cm^3 volume. Post debridement Stage noted as Category/Stage III. Character of Wound/Ulcer Post Debridement is stable. Post procedure Diagnosis Wound #11: Same as Pre-Procedure Plan Follow-up Appointments: Return Appointment in 2 weeks. Nurse Visit as needed Home Health: Home Health Company: - [facility] for wound care. May utilize formulary equivalent dressing for wound treatment orders unless otherwise specified. Home Health Nurse may visit PRN to address patients wound care needs. Scheduled days for dressing changes to be completed; exception, patient has scheduled wound care visit that day. **Please direct any NON-WOUND related issues/requests for orders to patient's Primary Care Physician. **If current dressing causes regression in wound condition, may D/C ordered dressing product/s and apply Normal Saline Moist Dressing daily until next Wound Healing Center or Other MD appointment. **Notify Wound Healing Center of regression in wound condition at [phone]. Bathing/ Shower/ Hygiene: Wash wounds with antibacterial soap and water. May shower; gently cleanse wound with antibacterial soap, rinse and pat dry prior to dressing wounds No tub bath. Off-Loading: Gel mattress overlay (Group 1) Low air-loss mattress (Group 2) Air fluidized (Group 3) Turn and  reposition every 2 hours Other: - relive pressure from heels on bed WOUND #8: - Gluteus Wound Laterality: Left Cleanser: Soap and Water (Generic) 3 x Per Week/30 Days Discharge Instructions: Gently cleanse wound with antibacterial soap, rinse and pat dry prior to dressing wounds Prim Dressing: Hydrofera Blue Ready Transfer Foam, 2.5x2.5 (in/in) (DME) (Dispense As Written) 3 x Per Week/30 Days ary Discharge Instructions: Apply Hydrofera Blue Ready to wound bed as directed Secondary Dressing: (BORDER) Zetuvit Plus SILICONE BORDER Dressing  5x5 (in/in) (DME) (Generic) 3 x Per Week/30 Days Discharge Instructions: Please do not put silicone bordered dressings under wraps. Use non-bordered dressing only. 1. Postdebridement I think Hydrofera Blue actually not a bad choice. He is likely to require more debridement of this fibrinous area. 2. There is probably not a lot of subcutaneous tissue here to deal with but fortunately there is no open bone that I could identify 3. No evidence of infection 4. I wanted to get him back into the clinic next week to follow-up on this however he apparently has some appointments elsewhere. 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 10/28/2022 Medical Record Number: [id number] Patient Account Number: [id number] Date of Birth/Sex: Treating RN: 1965/08/13 (57 y.o. [name] Primary Care Provider: Pearson Grippe Other Clinician: Referring Provider: Treating Provider/Extender: [name], MICHA EL Leanna Sato in Treatment: 54 Diagnosis Coding ICD-10 Codes Code Description L89.153 Pressure ulcer of sacral region, stage 3 L89.893 Pressure ulcer of other site, stage 3 G82.54 Quadriplegia, C5-C7 incomplete Z93.1 Gastrostomy status Facility Procedures : CPT4 Code: [id number] Description: 11042 - DEB SUBQ TISSUE 20 SQ CM/< ICD-10 Diagnosis Description L89.153 Pressure ulcer of sacral region, stage 3 Modifier: Quantity: 1 Physician Procedures : CPT4 Code Description Modifier [id number] 11042 - WC PHYS SUBQ TISS 20 SQ CM ICD-10 Diagnosis Description L89.153 Pressure ulcer of sacral region, stage 3 Quantity:  1 Electronic Signature(s) Signed: 10/28/2022 4:43:46 PM By: Baltazar Najjar MD Entered By: Baltazar Najjar on 10/28/2022 13:18:02

## 2022-11-11 ENCOUNTER — Encounter: Payer: Medicare Other | Attending: Physician Assistant | Admitting: Physician Assistant

## 2022-11-11 DIAGNOSIS — L89893 Pressure ulcer of other site, stage 3: Secondary | ICD-10-CM | POA: Insufficient documentation

## 2022-11-11 DIAGNOSIS — L89153 Pressure ulcer of sacral region, stage 3: Secondary | ICD-10-CM | POA: Insufficient documentation

## 2022-11-11 DIAGNOSIS — Z931 Gastrostomy status: Secondary | ICD-10-CM | POA: Diagnosis not present

## 2022-11-11 DIAGNOSIS — G8254 Quadriplegia, C5-C7 incomplete: Secondary | ICD-10-CM | POA: Insufficient documentation

## 2022-11-11 NOTE — Progress Notes (Signed)
BURNHAM, SEIN B ([id number]) [id number] Page 1 of 10 Visit Report for 11/11/2022 Arrival Information Details Patient Name: Date of Service: CALEM, BANFIELD 11/11/2022 12:15 PM Medical Record Number: [id number] Patient Account Number: [id number] Date of Birth/Sex: Treating RN: 24-Jan-1966 (57 y.o. [name] Primary Care : Pearson Grippe Other Clinician: Referring : Treating /Extender: Cleotis Lema in Treatment: 56 Visit Information History Since Last Visit Added or deleted any medications: No Patient Arrived: Wheel Chair Any new allergies or adverse reactions: No Arrival Time: 11:58 Has Dressing in Place as Prescribed: Yes Accompanied By: self Pain Present Now: No Transfer Assistance: Hoyer Lift Patient Identification Verified: Yes Secondary Verification Process Completed: Yes Patient Requires Transmission-Based Precautions: No Patient Has Alerts: No Electronic Signature(s) Signed: 11/11/2022 4:55:07 PM By: Midge Aver MSN RN CNS WTA Entered By: Midge Aver on 11/11/2022 12:15:20 -------------------------------------------------------------------------------- Clinic Level of Care Assessment Details Patient Name: Date of Service: DAVONTAE, RUSTON 11/11/2022 12:15 PM Medical Record Number: [id number] Patient Account Number: [id number] Date of Birth/Sex: Treating RN: June 23, 1965 (57 y.o. [name] Primary Care : Pearson Grippe Other Clinician: Referring : Treating /Extender: Cleotis Lema in Treatment: 56 Clinic Level of Care Assessment Items TOOL 1 Quantity Score []  - 0 Use when EandM and Procedure is performed on INITIAL visit ASSESSMENTS - Nursing Assessment / Reassessment []  - 0 General Physical Exam (combine w/ comprehensive assessment (listed just below) when performed on new pt. evals) []  - 0 Comprehensive Assessment (HX, ROS, Risk Assessments,  Wounds Hx, etc.) ASSESSMENTS - Wound and Skin Assessment / Reassessment []  - 0 Dermatologic / Skin Assessment (not related to wound area) ASSESSMENTS - Ostomy and/or Continence Assessment and Care ZUBAYR, LUNDRIGAN B ([id number]) [id number] Page 2 of 10 []  - 0 Incontinence Assessment and Management []  - 0 Ostomy Care Assessment and Management (repouching, etc.) PROCESS - Coordination of Care []  - 0 Simple Patient / Family Education for ongoing care []  - 0 Complex (extensive) Patient / Family Education for ongoing care []  - 0 Staff obtains Chiropractor, Records, T Results / Process Orders est []  - 0 Staff telephones HHA, Nursing Homes / Clarify orders / etc []  - 0 Routine Transfer to another Facility (non-emergent condition) []  - 0 Routine Hospital Admission (non-emergent condition) []  - 0 New Admissions / Manufacturing engineer / Ordering NPWT Apligraf, etc. , []  - 0 Emergency Hospital Admission (emergent condition) PROCESS - Special Needs []  - 0 Pediatric / Minor Patient Management []  - 0 Isolation Patient Management []  - 0 Hearing / Language / Visual special needs []  - 0 Assessment of Community assistance (transportation, D/C planning, etc.) []  - 0 Additional assistance / Altered mentation []  - 0 Support Surface(s) Assessment (bed, cushion, seat, etc.) INTERVENTIONS - Miscellaneous []  - 0 External ear exam []  - 0 Patient Transfer (multiple staff / Nurse, adult / Similar devices) []  - 0 Simple Staple / Suture removal (25 or less) []  - 0 Complex Staple / Suture removal (26 or more) []  - 0 Hypo/Hyperglycemic Management (do not check if billed separately) []  - 0 Ankle / Brachial Index (ABI) - do not check if billed separately Has the patient been seen at the hospital within the last three years: Yes Total Score: 0 Level Of Care: ____ Electronic Signature(s) Signed: 11/11/2022 4:55:07 PM By: Midge Aver MSN RN CNS WTA Entered By: Midge Aver on 11/11/2022 13:01:32 -------------------------------------------------------------------------------- Encounter Discharge Information Details Patient Name: Date of Service: [name], [place] B.  11/11/2022 12:15 PM Medical Record Number: [id number] Patient Account Number: [id number] Date of Birth/Sex: Treating RN: 1965-08-04 (57 y.o. [name] Primary Care : Pearson Grippe Other Clinician: Referring : Treating /Extender: Cleotis Lema in Treatment: 36 Encounter Discharge Information Items Post Procedure Vitals Discharge Condition: Stable Temperature (F): 98.1 [place], Jerris B ([id number]) [id number] Page 3 of 10 Ambulatory Status: Wheelchair Pulse (bpm): 128 Discharge Destination: Home Respiratory Rate (breaths/min): 18 Transportation: Other Blood Pressure (mmHg): 123/98 Accompanied By: self Schedule Follow-up Appointment: Yes Clinical Summary of Care: Electronic Signature(s) Signed: 11/11/2022 4:55:07 PM By: Midge Aver MSN RN CNS WTA Entered By: Midge Aver on 11/11/2022 13:23:37 -------------------------------------------------------------------------------- Lower Extremity Assessment Details Patient Name: Date of Service: [name], [name] 11/11/2022 12:15 PM Medical Record Number: [id number] Patient Account Number: [id number] Date of Birth/Sex: Treating RN: 11-27-1965 (57 y.o. [name] Primary Care : Pearson Grippe Other Clinician: Referring : Treating /Extender: Cleotis Lema in Treatment: 56 Electronic Signature(s) Signed: 11/11/2022 4:55:07 PM By: Midge Aver MSN RN CNS WTA Entered By: Midge Aver on 11/11/2022 12:31:39 -------------------------------------------------------------------------------- Multi Wound Chart Details Patient Name: Date of Service: [name], [name] 11/11/2022 12:15 PM Medical Record Number: [id number] Patient  Account Number: [id number] Date of Birth/Sex: Treating RN: 1965-05-04 (57 y.o. [name] Primary Care : Pearson Grippe Other Clinician: Referring : Treating /Extender: Cleotis Lema in Treatment: 56 Vital Signs Height(in): 73 Pulse(bpm): 128 Weight(lbs): 154 Blood Pressure(mmHg): 123/98 Body Mass Index(BMI): 20.3 Temperature(F): 98.1 Respiratory Rate(breaths/min): 18 [11:Photos:] [N/A:N/A [id number] Page 4 of 10] Midline Coccyx Left Gluteus N/A Wound Location: Pressure Injury Pressure Injury N/A Wounding Event: Pressure Ulcer Pressure Ulcer N/A Primary Etiology: Hypotension, Myocardial Infarction, Hypotension, Myocardial Infarction, N/A Comorbid History: History of pressure wounds, History of pressure wounds, Rheumatoid Arthritis, Paraplegia, Rheumatoid Arthritis, Paraplegia, Confinement Anxiety Confinement Anxiety 10/21/2022 09/05/2021 N/A Date Acquired: 2 56 N/A Weeks of Treatment: Open Open N/A Wound Status: No No N/A Wound Recurrence: 3.2x2.8x0.1 0.7x0.3x0.1 N/A Measurements L x W x D (cm) 7.037 0.165 N/A A (cm) : rea 0.704 0.016 N/A Volume (cm) : 40.50% 97.00% N/A % Reduction in A rea: 40.40% 99.70% N/A % Reduction in Volume: Category/Stage III Category/Stage III N/A Classification: Medium Medium N/A Exudate A mount: Serosanguineous Serosanguineous N/A Exudate Type: red, brown red, brown N/A Exudate Color: Medium (34-66%) Large (67-100%) N/A Granulation A mount: Red, Pink Pink N/A Granulation Quality: Medium (34-66%) Small (1-33%) N/A Necrotic A mount: Fat Layer (Subcutaneous Tissue): Yes Fat Layer (Subcutaneous Tissue): Yes N/A Exposed Structures: Fascia: No Tendon: No Muscle: No Joint: No Bone: No N/A None N/A Epithelialization: Treatment Notes Electronic Signature(s) Signed: 11/11/2022 4:55:07 PM By: Midge Aver MSN RN CNS WTA Entered By: Midge Aver on 11/11/2022  12:32:09 -------------------------------------------------------------------------------- Multi-Disciplinary Care Plan Details Patient Name: Date of Service: [name], [name] 11/11/2022 12:15 PM Medical Record Number: [id number] Patient Account Number: [id number] Date of Birth/Sex: Treating RN: [birth year] (57 y.o. [name] Primary Care : Pearson Grippe Other Clinician: Referring : Treating /Extender: Cleotis Lema in Treatment: 56 Active Inactive Pressure Nursing Diagnoses: Knowledge deficit related to causes and risk factors for pressure ulcer development Knowledge deficit related to management of pressures ulcers Potential for impaired tissue integrity related to pressure, friction, moisture, and shear Goals: Patient will remain free from development of additional pressure ulcers Fong, Branston B ([id number]) [id number] Page 5 of 10 Date Initiated: 12/03/2021 Date Inactivated: 07/15/2022 Target Resolution Date: 12/03/2021 Goal  Status: Met Patient/caregiver will verbalize risk factors for pressure ulcer development Date Initiated: 12/03/2021 Date Inactivated: 08/26/2022 Target Resolution Date: 09/05/2022 Goal Status: Met Patient/caregiver will verbalize understanding of pressure ulcer management Date Initiated: 12/03/2021 Target Resolution Date: 01/17/2023 Goal Status: Active Interventions: Assess: immobility, friction, shearing, incontinence upon admission and as needed Assess offloading mechanisms upon admission and as needed Assess potential for pressure ulcer upon admission and as needed Provide education on pressure ulcers Notes: Wound/Skin Impairment Nursing Diagnoses: Impaired tissue integrity Knowledge deficit related to ulceration/compromised skin integrity Goals: Ulcer/skin breakdown will have a volume reduction of 30% by week 4 Date Initiated: 10/15/2021 Date Inactivated: 07/29/2022 Target  Resolution Date: 11/12/2021 Goal Status: Unmet Unmet Reason: underlying conditions Ulcer/skin breakdown will have a volume reduction of 50% by week 8 Date Initiated: 10/15/2021 Date Inactivated: 07/29/2022 Target Resolution Date: 12/10/2021 Goal Status: Met Ulcer/skin breakdown will have a volume reduction of 80% by week 12 Date Initiated: 10/15/2021 Date Inactivated: 07/29/2022 Target Resolution Date: 01/07/2022 Goal Status: Met Ulcer/skin breakdown will heal within 14 weeks Date Initiated: 10/15/2021 Target Resolution Date: 10/04/2022 Goal Status: Active Interventions: Assess patient/caregiver ability to obtain necessary supplies Assess patient/caregiver ability to perform ulcer/skin care regimen upon admission and as needed Assess ulceration(s) every visit Provide education on ulcer and skin care Notes: documentation for wound 8 Electronic Signature(s) Signed: 11/11/2022 4:55:07 PM By: Midge Aver MSN RN CNS WTA Entered By: Midge Aver on 11/11/2022 13:22:00 -------------------------------------------------------------------------------- Pain Assessment Details Patient Name: Date of Service: [name], Lyrik B. 11/11/2022 12:15 PM Medical Record Number: [id number] Patient Account Number: [id number] Date of Birth/Sex: Treating RN: 08/18/1965 (57 y.o. [name] Primary Care : Pearson Grippe Other Clinician: Referring : Treating /Extender: Cleotis Lema in Treatment: [address] [place], [place] B ([id number]) [id number] Page 6 of 10 Location of Pain Severity and Description of Pain Patient Has Paino No Site Locations Pain Management and Medication Current Pain Management: Electronic Signature(s) Signed: 11/11/2022 4:55:07 PM By: Midge Aver MSN RN CNS WTA Entered By: Midge Aver on 11/11/2022 12:16:00 -------------------------------------------------------------------------------- Patient/Caregiver  Education Details Patient Name: Date of Service: [place] 8/6/2024andnbsp12:15 PM Medical Record Number: [id number] Patient Account Number: [id number] Date of Birth/Gender: Treating RN: 08/22/1965 (57 y.o. [name] Primary Care Physician: Pearson Grippe Other Clinician: Referring Physician: Treating Physician/Extender: Cleotis Lema in Treatment: 30 Education Assessment Education Provided To: Patient Education Topics Provided Wound Debridement: Handouts: Wound Debridement Methods: Explain/Verbal Responses: State content correctly Wound/Skin Impairment: Handouts: Caring for Your Ulcer Methods: Explain/Verbal Responses: State content correctly Electronic Signature(s) Signed: 11/11/2022 4:55:07 PM By: Midge Aver MSN RN CNS WTA Entered By: Midge Aver on 11/11/2022 13:22:26 [name] ([id number]) [id number] Page 7 of 10 -------------------------------------------------------------------------------- Wound Assessment Details Patient Name: Date of Service: INGVALD, BAYONA 11/11/2022 12:15 PM Medical Record Number: [id number] Patient Account Number: [id number] Date of Birth/Sex: Treating RN: May 27, 1965 (57 y.o. 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 Wound Open Wounding Event: Pressure Injury Status: Date Acquired: 10/21/2022 Comorbid Hypotension, Myocardial Infarction, History of pressure wounds, Weeks Of Treatment: 2 History: Rheumatoid Arthritis, Paraplegia, Confinement Anxiety Clustered Wound: No Photos Wound Measurements Length: (cm) 3.2 Width: (cm) 2.8 Depth: (cm) 0.1 Area: (cm) 7.037 Volume: (cm) 0.704 % Reduction in Area: 40.5% % Reduction in Volume: 40.4% Wound  Description  Classification: Category/Stage III Exudate Amount: Medium Exudate Type: Serosanguineous Exudate Color: red, brown Foul Odor After Cleansing: No Slough/Fibrino Yes Wound Bed Granulation Amount: Medium (34-66%) Exposed Structure Granulation Quality: Red, Pink Fascia Exposed: No Necrotic Amount: Medium (34-66%) Fat Layer (Subcutaneous Tissue) Exposed: Yes Necrotic Quality: Adherent Slough Tendon Exposed: No Muscle Exposed: No Joint Exposed: No Bone Exposed: No Treatment Notes Wound #11 (Coccyx) Wound Laterality: Midline Cleanser Soap and Water Discharge Instruction: Gently cleanse wound with antibacterial soap, rinse and pat dry prior to dressing wounds Diesel, Matis B ([id number]) [id number] Page 8 of 10 Peri-Wound Care Topical Primary Dressing Hydrofera Blue Ready Transfer Foam, 2.5x2.5 (in/in) Discharge Instruction: Apply Hydrofera Blue Ready to wound bed as directed Secondary Dressing (BORDER) Zetuvit Plus SILICONE BORDER Dressing 5x5 (in/in) Discharge Instruction: Please do not put silicone bordered dressings under wraps. Use non-bordered dressing only. Secured With Compression Wrap Compression Stockings Facilities manager) Signed: 11/11/2022 4:55:07 PM By: Midge Aver MSN RN CNS WTA Entered By: Midge Aver on 11/11/2022 12:31:08 -------------------------------------------------------------------------------- Wound Assessment Details Patient Name: Date of Service: [name], Quintrell B. 11/11/2022 12:15 PM Medical Record Number: [id number] Patient Account Number: [id number] Date of Birth/Sex: Treating RN: 1966/01/13 (57 y.o. 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 Wound Open Wounding Event:  Pressure Injury Status: Date Acquired: 09/05/2021 Comorbid Hypotension, Myocardial Infarction, History of pressure wounds, Weeks Of Treatment: 56 History: Rheumatoid Arthritis, Paraplegia, Confinement Anxiety Clustered Wound: No Photos Wound Measurements Length: (cm) 0.7 Width: (cm) 0.3 Depth: (cm) 0.1 Area: (cm) 0.165 Volume: (cm) 0.016 Driskill, Alize B ([id number]) % Reduction in Area: 97% % Reduction in Volume: 99.7% Epithelialization: None [id number] Page 9 of 10 Wound Description Classification: Category/Stage III Exudate Amount: Medium Exudate Type: Serosanguineous Exudate Color: red, brown Foul Odor After Cleansing: No Slough/Fibrino No Wound Bed Granulation Amount: Large (67-100%) Exposed Structure Granulation Quality: Pink Fat Layer (Subcutaneous Tissue) Exposed: Yes Necrotic Amount: Small (1-33%) Treatment Notes Wound #8 (Gluteus) Wound Laterality: Left Cleanser Soap and Water Discharge Instruction: Gently cleanse wound with antibacterial soap, rinse and pat dry prior to dressing wounds Peri-Wound Care Topical Primary Dressing Hydrofera Blue Ready Transfer Foam, 2.5x2.5 (in/in) Discharge Instruction: Apply Hydrofera Blue Ready to wound bed as directed Secondary Dressing (BORDER) Zetuvit Plus SILICONE BORDER Dressing 5x5 (in/in) Discharge Instruction: Please do not put silicone bordered dressings under wraps. Use non-bordered dressing only. Secured With Compression Wrap Compression Stockings Facilities manager) Signed: 11/11/2022 4:55:07 PM By: Midge Aver MSN RN CNS WTA Entered By: Midge Aver on 11/11/2022 12:31:25 -------------------------------------------------------------------------------- Vitals Details Patient Name: Date of Service: [name], Eoin B. 11/11/2022 12:15 PM Medical Record Number: [id number] Patient Account Number: [id number] Date of Birth/Sex: Treating RN: Feb 13, 1966 (57 y.o. [name] Primary Care : Pearson Grippe Other Clinician: Referring : Treating /Extender: Cleotis Lema in Treatment: 56 Vital Signs Time Taken: 12:15 Temperature (F): 98.1 Height (in): 73 Pulse (bpm): 128 Weight (lbs): 154 Respiratory Rate (breaths/min): 18 Body Mass Index (BMI): 20.3 Blood Pressure (mmHg): 123/98 Reference Range: 80 - 120 mg / dl Mclaurin, Christon B ([id number]) [id number] Page 10 of 10 Electronic Signature(s) Signed: 11/11/2022 4:55:07 PM By: Midge Aver MSN RN CNS WTA Entered By: Midge Aver on 11/11/2022 12:15:46

## 2022-11-11 NOTE — Progress Notes (Signed)
[name], [name] ([id number]) [id number] Page 1 of 12 Visit Report for 11/11/2022 Chief Complaint Document Details Patient Name: Date of Service: [name], [name] 11/11/2022 12:15 PM Medical Record Number: [id number] Patient Account Number: [id number] Date of Birth/Sex: Treating RN: [birth year] (57 y.o. [name] Primary Care Provider: Pearson Grippe Other Clinician: Referring Provider: Treating Provider/Extender: Cleotis Lema in Treatment: 56 Information Obtained from: Patient Chief Complaint Sacral and back pressure ulcers Electronic Signature(s) Signed: 11/11/2022 12:30:46 PM By: Allen Derry PA-C Entered By: Allen Derry on 11/11/2022 12:30:46 -------------------------------------------------------------------------------- Debridement Details Patient Name: Date of Service: [name], [name]. 11/11/2022 12:15 PM Medical Record Number: [id number] Patient Account Number: [id number] Date of Birth/Sex: Treating RN: 09/04/1965 (57 y.o. [name] Primary Care Provider: Pearson Grippe Other Clinician: Referring Provider: Treating Provider/Extender: Cleotis Lema in Treatment: 56 Debridement Performed for Assessment: Wound #11 Midline Coccyx Performed By: Physician Allen Derry, PA-C Debridement Type: Debridement Level of Consciousness (Pre-procedure): Awake and Alert Pre-procedure Verification/Time Out Yes - 12:59 Taken: Start Time: 12:59 Percent of Wound Bed Debrided: 100% T Area Debrided (cm): otal 7.03 Tissue and other material debrided: Viable, Non-Viable, Slough, Subcutaneous, Biofilm, Slough Level: Skin/Subcutaneous Tissue Debridement Description: Excisional Instrument: Curette Bleeding: Moderate Hemostasis Achieved: Pressure Procedural Pain: 0 Post Procedural Pain: 0 Response to Treatment: Procedure was tolerated well Level of Consciousness [name], [name] ([id number])  [id number] Page 2 of 12 Level of Consciousness (Post- Awake and Alert procedure): Post Debridement Measurements of Total Wound Length: (cm) 3.2 Stage: Category/Stage III Width: (cm) 2.8 Depth: (cm) 0.1 Volume: (cm) 0.704 Character of Wound/Ulcer Post Debridement: Stable Post Procedure Diagnosis Same as Pre-procedure Electronic Signature(s) Unsigned Entered By: Midge Aver on 11/11/2022 13:00:18 -------------------------------------------------------------------------------- HPI Details Patient Name: Date of Service: [name], [name] 11/11/2022 12:15 PM Medical Record Number: [id number] Patient Account Number: [id number] Date of Birth/Sex: Treating RN: 08-28-1965 (57 y.o. [name] Primary Care Provider: Pearson Grippe Other Clinician: Referring Provider: Treating Provider/Extender: Cleotis Lema in Treatment: 56 History of Present Illness ssociated Signs and Symptoms: Patient has a history of confirmed osteomyelitis of the left foot according to MRI July 2019. 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 HPI Description: 12/08/17 on evaluation today patient presents for initial evaluation and our clinic concerning issues he has been having with his feet bilaterally although the left more than right most recently. He was admitted to hospital where he was placed on IV vancomycin which actually he continue to utilize until discharge and even when discharged continue to be on until around 16 August according to what he tells me. His PICC line was removed at that point. Nonetheless he has been seen in the wound care center in [facility] before transferring to [place] due to the fact that the doctor there left and they are no longer open. Nonetheless he does have significant osteomyelitis of the left foot noted on MRI which revealed significant issues with necrotic bone  including a portion of the distal region of his left great toe which was felt to possibly either be missing as a result of amputation or potentially resorption. Either way he also had osteomyelitis noted of the digit, metatarsal region, cuneiform, and navicular bones. There is definite bone exposure noted externally at this point in time. In regard to the right foot he has issues with ulcerations here as well although he states  that recently he's had no issues until this just blistered and reopened. Apparently Xeroform was used in this has caused things to be somewhat more moist and open more according to the patient. He's otherwise been tolerating the dressing changes without complication using silver alginate dressings. He has no discomfort but does seem to be extremely stressed regarding the fact that he did see Dr. 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 The patient stated per notes reviewed that he did not want to have any surgery and therefore has a repeat appointment scheduled with the surgeon on 12/15/17. With all that being said he feels like that he really has not been alerted to what was going on in the severity that was until just recently and has a lot of questions in that regard. I'll be see I explained that seeing him for the first time today I cannot answer a lot of those questions that he has. 12/21/17 on evaluation today patient actually appears to be doing a little better in regard to the right foot in particular. There is one area where there still definitive bone noted although the MRI which I did review today that we had ordered was negative for any evidence of osteomyelitis which is good news. Nonetheless there was some necrotic bone noted. Overall the patient appears to be doing fairly well however in regard to the right foot. Unfortunately he does have a new open area on the posterior aspect of his left leg as well as the continued area of  necrosis noted in the central portion of the foot. He states that he's actually going to be sent for reevaluation specifically to a limb salvage clinic at [facility] he believes this is something that is primary care provider has been working with. They were just awaiting the results of the MRI. 01/08/18 on evaluation today patient actually appears to be doing maybe just a little better in regard to the overall appearance of his bilateral foot ulcers. In general this does not seem to have worsened at least which is good news. He still has evidence of bone noted on the surface of both wound areas which though dry appearing really has not otherwise changed at this time. He actually has an appointment at [facility] with the wound center to discuss limb salvage. He discusses with me at the last visit. Nonetheless he was somewhat upset today about a couple things one being that he stated that we did not put the order in for the protective dressing for his knees that we discussed last time. With that being said I had put in the order for the protective dressing in fact it was on his order sheet that was signed on 12/21/17. Nonetheless unfortunately it appears this was overlooked by home health and they told him if they had the order they could put the protective dressing on. Nonetheless they told him they did not have the order. Again I believe this was an oversight nothing intentional on anyone's part nonetheless the order was there. Still I had explained to the patient sometimes insurance will not cover for the protective dressing even if it is ordered and this was discussed in the last visit. 01/25/18 on evaluation today patient presents for follow-up concerning his bilateral lower extremity ulcers. He has actually been doing about the same since I last saw him. We do have them in the room we can look at his gluteal region today as well to ensure that  there is nothing open or if so address the issue  there. JOBAN, BHUIYAN [name] ([id number]) [id number] Page 3 of 12 Nonetheless he was supposed to see you and see wound care/limb salvage prior to his visit with me today. Unfortunately when he arrived last time at their clinic he apparently was there on the wrong day the doctor was not even present that he was his to be seen. Nonetheless I had to be switched to a different day and the patient actually is going in the next week to see them. Assuming everything works out this should actually be tomorrow. Nonetheless if they take over care of that point then we will subsequently release care to them. Readmission: 10-15-2021 upon evaluation today patient appears to be doing somewhat poorly in regard to wounds over the left gluteal region. He also has areas in the midline sacrum although this is not quite as bad. There is however some evidence of necrotic bone noted at this point unfortunately. The question is whether this is just something working its way out or if it something that is actually actively infected at this point. We are going to have to evaluate that further little by little as we go with additional diagnostics. With that being said the patient tells me currently that what we are seeing him for has been open since around 09-05-2021 for the gluteal area on the left and in regard to the sacrum 10-01-2021 he states. With that being said he again is not too significant as far as the overall appearance of the wounds are concerned but again with the bone exposed this is something that we may want to do at pathology and culture in regard to. 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 This is a gentleman whom I have previously seen back in 2019. The wound that we were taking care of back at that time is completely cleared. 7/25; 2-week follow-up. The  patient has 3 small open areas in the lower sacrum and then an area over the left ischial tuberosity. Both of these are stage III wounds. He has been using Hydrofera Blue. 11-12-2021 upon evaluation today patient appears to be doing well currently in regard to his wounds. The distal sacral area is almost completely closed and looks to be doing great. In regard to the left gluteal region this is showing signs of improvement from a depth perspective size is about the same. Overall I am extremely pleased with where things stand compared to what we were previous. 12-03-2021 upon evaluation today patient appears to be doing well currently in regard to his wound. He is actually showing signs of improvement the sacral region and the right ischial/gluteal region is completely healed. The left ischial/gluteal region is still open but seems to be doing much better. 12-24-2021 upon evaluation today patient appears to be doing well currently in regard to some of his wounds others have reopened or are not doing quite as well. Overall though I think that he is on a good track he just may be needs to be a little bit better on appropriate offloading we discussed that today as well. 01-27-2022 upon evaluation today patient appears to be doing well currently in regard to his wound. He is not showing any signs of significant tissue breakdown but unfortunately the wound is larger than what it was last time I saw him. He tells me that  this seems to be a complete shock based on what he is hearing today. He tells me that his son was telling him this has been doing well and that it was smaller and not really bleeding or draining much. Again this is definitely significantly larger than last time I saw him but does not appear to be obviously infected there is some undermining noted as well. In general I am unsure of what would have caused such a dramatic changes what he seems to feel has happened here but nonetheless indeed I do not  feel like that this is doing nearly as well as last time I saw him. 11/6; his wound measured better today per our intake nurse using silver alginate and border foam although according to the patient were having trouble with supplies especially the border foam. He has home health. 02-24-2022 upon evaluation today patient appears to be doing better in regard to his wound. The sacral area is looking like it is trying to break down a little bit is not actually open right now but we definitely need to be careful in this regard. I discussed that with him today. With that being said the patient tells me that he definitely is not changing positions as frequently as he should and I think that is the primary cause of what we are seeing here. Fortunately there does not appear to be any signs of infection locally or systemically at this time. No fevers, chills, nausea, vomiting, or diarrhea. 03-11-2022 upon evaluation today patient is actually making excellent improvement in general. I am extremely pleased with where things stand I do believe that he is headed on the right direction here. 03-25-2022 upon evaluation today patient appears to be doing well currently in regard to his wounds for the most part although the sacral area appears to be a little bit more broken down than what we would have expected. Fortunately there does not appear to be any signs of infection he tells me that he is not exactly sure what happened that caused this to breakdown to the degree that it is. Fortunately there does not appear to be any signs of active infection locally nor systemically at this time which is great news. No fevers, chills, nausea, vomiting, or diarrhea. 04-22-2022 upon evaluation today patient's wound actually appears to be doing well on the left and the gluteal fold location. With that being said in regard to the sacral area this is open and a little bit more slough covered at this point. Fortunately I do not see any  signs of infection locally nor systemically which is great news and overall I am extremely pleased with where things stand today. Overall I am very happy with where we are and I do think that he can get the sacral area he will get again but we need to keep a close eye on everything for sure. He does tell me he is good to be more aggressive with appropriate offloading which I think is definitely a good way to go. 05-06-2022 upon evaluation today patient appears to be doing well currently in regard to his wound on the sacral area though it is a little bit hyper granulated at this point unfortunately. Otherwise this seems to be doing overall decently well. In general I think that we are headed in the right direction. In regards to the wound in the left gluteal region this does have a little bit of depth to it but still seems to be showing signs of  improvement which is great news and I am pleased in that regard. 05-20-2022 upon evaluation today patient appears to be doing better in regard to his wounds although he tells me he has been having to make to as he did not have the supplies ordered that he needed for dressing changes. Fortunately there does not appear to be any signs of active infection locally nor systemically which is great news. No fevers, chills, nausea, vomiting, or diarrhea. 06-03-2022 upon evaluation today patient actually appears to be doing excellent in fact his sacral wound is completely healed after the silver nitrate 2 weeks ago. Fortunately I do not see any signs of active infection locally nor systemically at this time. 06-24-2022 upon evaluation today patient actually appears to be making excellent progress in regard to his wounds. He just has 1 left on the left hip/trochanter location and this 1 remaining spot is very tiny just a pinpoint. 07-15-2022 upon evaluation today patient appears to be doing well with regard to his wounds. In fact the wound remaining is almost completely closed  and is showing signs of significant improvement. I am very pleased in that regard. There is no signs of active infection at this point. 07-29-2022 upon evaluation today patient appears to be doing well currently in regard to his wound. He has been tolerating the dressing changes without complication. Fortunately there does not appear to be any signs of active infection which is great news and overall I am extremely pleased with where things are currently. 08-26-2022 upon evaluation today patient appears to be doing well currently in regard to his wound. He has been tolerating the dressing changes without complication. Fortunately there does not appear to be any signs of active infection locally nor systemically which is great news. No fevers, chills, nausea, vomiting, or diarrhea. 09-09-2022 upon evaluation today patient appears to be doing well currently in regard to his wound I think we are very close to healing his ulcer in regard to use of the collagen. He does have a little bit of breakdown around the edges of the wound I think some Calmoseptine over this area could be beneficial for him. Fortunately I do not see any evidence overall of worsening which is great news. No fevers, chills, nausea, vomiting, or diarrhea. 09-30-2022 upon evaluation today patient appears to be doing well currently in regard to his wound. He has been tolerating the dressing changes without complication. Fortunately I do not see any signs of infection in fact all the previous wounds are pretty much healed except for a little area of irritation beside where he had the deeper wound previous. Fortunately I do not see any evidence of active infection locally or systemically at this time. [name], [name] ([id number]) [id number] Page 4 of 12 7/23; since the patient was last here the left buttock wound is actually healed however unfortunately we have had a reopening of the wound on the sacrum.  He says this has been open for about 2 weeks he has been using some leftover Hydrofera Blue that his family is changing. He had managed to correct the issues he was having with his bed and his surface. 11-11-2022 upon evaluation today patient appears to be doing well currently in regard to his wounds and the ischial/gluteal locations. Has been tolerating the dressing changes without complication. Fortunately there does not appear to be any signs of active infection locally or systemically which is great news and overall I am extremely pleased with where we stand at this  point. The sacral area unfortunately reopened in the interim since I last saw him I think a lot of this has to do with the bed not functioning properly at event a few weeks since I saw the patient and then he saw Dr. Leanord Hawking when I was out a couple of weeks ago. Nonetheless I think that he is moving in the right direction nonetheless with what we see currently in the bed is in place now and appropriate. Electronic Signature(s) Signed: 11/11/2022 1:25:40 PM By: Allen Derry PA-C Entered By: Allen Derry on 11/11/2022 13:25:40 -------------------------------------------------------------------------------- Physical Exam Details Patient Name: Date of Service: BERKAY, PIGNONE 11/11/2022 12:15 PM Medical Record Number: [id number] Patient Account Number: [id number] Date of Birth/Sex: Treating RN: 1966-02-23 (57 y.o. [name] Primary Care Provider: Pearson Grippe Other Clinician: Referring Provider: Treating Provider/Extender: Cleotis Lema in Treatment: 73 Constitutional Well-nourished and well-hydrated in no acute distress. Respiratory normal breathing without difficulty. Psychiatric this patient is able to make decisions and demonstrates good insight into disease process. Alert and Oriented x 3. pleasant and cooperative. Notes Upon inspection patient's wounds in the ischial locations actually both appear to  be doing pretty well there is a little irritation on the left side but this is minimal and not even where the original one was but on the perimeter of this area. With regard to the sacral wound this is can require some sharp debridement I did perform debridement today clearly necrotic debris tolerated that without complication postdebridement wound bed is significantly improved. Electronic Signature(s) Signed: 11/11/2022 1:34:43 PM By: Allen Derry PA-C Entered By: Allen Derry on 11/11/2022 13:34:43 -------------------------------------------------------------------------------- Physician Orders Details Patient Name: Date of Service: [address], [name]. 11/11/2022 12:15 PM Medical Record Number: [id number] Patient Account Number: [id number] Date of Birth/Sex: Treating RN: Aug 11, 1965 (57 y.o. [name] Primary Care Provider: Pearson Grippe Other Clinician: [name] ([id number]) [id number] Page 5 of 12 Referring Provider: Treating Provider/Extender: Cleotis Lema in Treatment: 56 Verbal / Phone Orders: No Diagnosis Coding ICD-10 Coding Code Description L89.153 Pressure ulcer of sacral region, stage 3 L89.893 Pressure ulcer of other site, stage 3 G82.54 Quadriplegia, C5-C7 incomplete Z93.1 Gastrostomy status Follow-up Appointments Return Appointment in 2 weeks. Nurse Visit as needed Home Health Home Health Company: - [name] for wound care. May utilize formulary equivalent dressing for wound treatment orders unless otherwise specified. Home Health Nurse may visit PRN to address patients wound care needs. Scheduled days for dressing changes to be completed; exception, patient has scheduled wound care visit that day. **Please direct any NON-WOUND related issues/requests for orders to patient's Primary Care Physician. **If current dressing causes regression in wound condition, may D/C ordered dressing  product/s and apply Normal Saline Moist Dressing daily until next Wound Healing Center or Other MD appointment. **Notify Wound Healing Center of regression in wound condition at [phone]. Bathing/ [employer] wounds with antibacterial soap and water. May shower; gently cleanse wound with antibacterial soap, rinse and pat dry prior to dressing wounds No tub bath. Off-Loading Gel mattress overlay (Group 1) Low air-loss mattress (Group 2) A fluidized (Group 3) ir Turn and reposition every 2 hours Other: - relive pressure from heels on bed Wound Treatment Wound #11 - Coccyx Wound Laterality: Midline Cleanser: Soap and Water (Generic) 3 x Per Week/30 Days Discharge Instructions: Gently cleanse wound with antibacterial soap, rinse and pat dry prior to dressing wounds Prim Dressing: Hydrofera Blue Ready Transfer Foam,  2.5x2.5 (in/in) (Dispense As Written) 3 x Per Week/30 Days ary Discharge Instructions: Apply Hydrofera Blue Ready to wound bed as directed Secondary Dressing: (BORDER) Zetuvit Plus SILICONE BORDER Dressing 5x5 (in/in) (Generic) 3 x Per Week/30 Days Discharge Instructions: Please do not put silicone bordered dressings under wraps. Use non-bordered dressing only. Wound #8 - Gluteus Wound Laterality: Left Cleanser: Soap and Water (Generic) 3 x Per Week/30 Days Discharge Instructions: Gently cleanse wound with antibacterial soap, rinse and pat dry prior to dressing wounds Prim Dressing: Hydrofera Blue Ready Transfer Foam, 2.5x2.5 (in/in) (Dispense As Written) 3 x Per Week/30 Days ary Discharge Instructions: Apply Hydrofera Blue Ready to wound bed as directed Secondary Dressing: (BORDER) Zetuvit Plus SILICONE BORDER Dressing 5x5 (in/in) (Generic) 3 x Per Week/30 Days Discharge Instructions: Please do not put silicone bordered dressings under wraps. Use non-bordered dressing only. Electronic Signature(s) Unsigned Entered By: Midge Aver on 11/11/2022  13:00:55 Signature(s): [name] ([id number]) [id number] Page 6 of Date(s): 12 Prescription 11/11/2022 -------------------------------------------------------------------------------- Marquita Palms PA-C Patient Name: Provider: [birth year] [id number] Date of Birth: NPI#: Judie Petit [id number] Sex: DEA #: [phone] Phone #: License #: UPN: Patient Address: [address] [facility] [place], [place] [address] [facility] [address] [place], [place] [address] [phone] Allergies niacin; shrimp Provider's Orders Gel mattress overlay (Group 1) Hand Signature: Date(s): Prescription 11/11/2022 Marquita Palms PA-C Patient Name: Provider: [birth year] [id number] Date of Birth: NPI#: Judie Petit [id number] Sex: DEA #: [phone] Phone #: License #: UPN: Patient Address: [address] [facility] [place], [place] [address] [facility] [name] [place], [place] [address] [phone] Allergies niacin; shrimp Provider's Orders Low air-loss mattress (Group 2) Hand Signature: Date(s): Prescription 11/11/2022 Marquita Palms PA-C Patient Name: Provider: [birth year] [id number] Date of Birth: NPI#: Judie Petit [id number] Sex: DEA #: [phone] Phone #: License #: UPN: Patient Address: [address] [facility] [place], [place] [address] [facility] [address], [address] [place], [place] [address] [phone] Allergies niacin; shrimp [name], [name] ([id number]) [id number] Page 7 of 12 Provider's Orders A fluidized (Group 3) ir Hand Signature: Date(s): Electronic Signature(s) Unsigned Entered By: Midge Aver on 11/11/2022  13:00:56 -------------------------------------------------------------------------------- Problem List Details Patient Name: Date of Service: [name], [name] 11/11/2022 12:15 PM Medical Record Number: [id number] Patient Account Number: [id number] Date of Birth/Sex: Treating RN: [birth year] (57 y.o. 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 [name] Primary Care Provider: Pearson Grippe Other Clinician: Referring Provider: Treating Provider/Extender: Cleotis Lema in Treatment: 56 Subjective Chief Complaint Information obtained from Patient Sacral and back pressure ulcers History  of Present Illness (HPI) The following HPI elements were documented for the patient's wound: Associated Signs and Symptoms: Patient has a history of confirmed osteomyelitis of the left foot according to MRI July 2019. 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 12/08/17 on evaluation today patient presents for initial evaluation and our clinic concerning issues he has been having with his feet bilaterally although the left more than right most recently. He was admitted to hospital where he was placed on IV vancomycin which actually he continue to utilize until discharge and even when discharged continue to be on until around 16 August according to what he tells me. His PICC line was removed at that point. Nonetheless he has been seen in the wound care center in [facility] [place] before transferring to [place] due to the fact that the doctor there left and they are no longer open. Nonetheless he does have significant osteomyelitis of the left foot noted on MRI which revealed significant issues with necrotic bone including a portion of the distal region of his left great toe which was felt to possibly either be missing as a result of amputation or potentially resorption. Either way he also had osteomyelitis noted of the digit, metatarsal region, cuneiform, and navicular bones. There is definite bone exposure noted externally at this point in time. In regard to the right foot he has issues with ulcerations here as well although he states that recently he's had no issues until this just blistered and reopened. Apparently Xeroform was used in this has caused things to be somewhat more moist and open more according to the patient. He's otherwise been tolerating the dressing changes without complication using silver alginate dressings. He has no discomfort but does seem to be extremely stressed regarding the fact that he did see Dr. 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 The patient stated per notes reviewed that he did not want to have any surgery and therefore has a repeat appointment scheduled with the surgeon on 12/15/17. With all that being said he feels like that he really has not been alerted to what was going on in  the severity that was until just recently and has a lot of questions in that regard. I'll be see I explained that seeing him for the first time today I cannot answer a lot of those questions that he has. 12/21/17 on evaluation today patient actually appears to be doing a little better in regard to the right foot in particular. There is one area where there still definitive bone noted although the MRI which I did review today that we had ordered was negative for any evidence of osteomyelitis which is good news. Nonetheless there was some necrotic bone noted. Overall the patient appears to be doing fairly well however in regard to the right foot. Unfortunately he does have a new open area on the posterior aspect of his left leg as well as the continued area of necrosis noted in the central portion of the foot. He states that he's actually going to be sent for reevaluation specifically to a limb salvage clinic at [facility] he believes this is something that is primary care provider has been working with. They were just awaiting the results of the MRI. 01/08/18 on evaluation today patient actually appears to be doing maybe just a little better in regard to the overall appearance of his bilateral foot ulcers. In  general this does not seem to have worsened at least which is good news. He still has evidence of bone noted on the surface of both wound areas which though dry appearing really has not otherwise changed at this time. He actually has an appointment at [facility] with the wound center to discuss limb salvage. He discusses with me at the last visit. Nonetheless he was somewhat upset today about a couple things one being that he stated that we did not put the order in for the protective dressing for his knees that we discussed last time. With that being said I had put in the order for the protective dressing in fact it was on his order sheet that was signed on 12/21/17. Nonetheless unfortunately it appears  this was overlooked by home health and they told him if they had the order they could put the protective dressing on. Nonetheless they told him they did not have the order. Again I believe this was an oversight nothing intentional on anyone's part nonetheless the order was there. Still I had explained to the patient sometimes insurance will not cover for the protective dressing even if it is ordered and this was discussed in the last visit. 01/25/18 on evaluation today patient presents for follow-up concerning his bilateral lower extremity ulcers. He has actually been doing about the same since I last saw him. We do have them in the room we can look at his gluteal region today as well to ensure that there is nothing open or if so address the issue there. Nonetheless he was supposed to see you and see wound care/limb salvage prior to his visit with me today. Unfortunately when he arrived last time at their clinic he apparently was there on the wrong day the doctor was not even present that he was his to be seen. Nonetheless I had to be switched to a different day and the patient actually is going in the next week to see them. Assuming everything works out this should actually be tomorrow. Nonetheless if they take over care of that point then we will subsequently release care to them. Readmission: 10-15-2021 upon evaluation today patient appears to be doing somewhat poorly in regard to wounds over the left gluteal region. He also has areas in the midline sacrum although this is not quite as bad. There is however some evidence of necrotic bone noted at this point unfortunately. The question is whether this is just something working its way out or if it something that is actually actively infected at this point. We are going to have to evaluate that further little by little as we go with additional diagnostics. With that being said the patient tells me currently that what we are seeing him for has been open  since around 09-05-2021 for the gluteal area on the left and in regard to the sacrum 10-01-2021 he states. With that being said he again is not too significant as far as the overall appearance of the wounds are concerned but again with the bone exposed this is something that we may want to do at pathology and culture in regard to. 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 This is a gentleman whom I have previously seen back in 2019. The wound that we were taking care of back at that time is completely cleared. 7/25; 2-week follow-up. The patient has 3 small open areas  in the lower sacrum and then an area over the left ischial tuberosity. Both of these are stage III wounds. He has been using Hydrofera Blue. 11-12-2021 upon evaluation today patient appears to be doing well currently in regard to his wounds. The distal sacral area is almost completely closed and looks [name], [name] ([id number]) [id number] Page 9 of 12 to be doing great. In regard to the left gluteal region this is showing signs of improvement from a depth perspective size is about the same. Overall I am extremely pleased with where things stand compared to what we were previous. 12-03-2021 upon evaluation today patient appears to be doing well currently in regard to his wound. He is actually showing signs of improvement the sacral region and the right ischial/gluteal region is completely healed. The left ischial/gluteal region is still open but seems to be doing much better. 12-24-2021 upon evaluation today patient appears to be doing well currently in regard to some of his wounds others have reopened or are not doing quite as well. Overall though I think that he is on a good track he just may be needs to be a little bit better on appropriate offloading we discussed that today as  well. 01-27-2022 upon evaluation today patient appears to be doing well currently in regard to his wound. He is not showing any signs of significant tissue breakdown but unfortunately the wound is larger than what it was last time I saw him. He tells me that this seems to be a complete shock based on what he is hearing today. He tells me that his son was telling him this has been doing well and that it was smaller and not really bleeding or draining much. Again this is definitely significantly larger than last time I saw him but does not appear to be obviously infected there is some undermining noted as well. In general I am unsure of what would have caused such a dramatic changes what he seems to feel has happened here but nonetheless indeed I do not feel like that this is doing nearly as well as last time I saw him. 11/6; his wound measured better today per our intake nurse using silver alginate and border foam although according to the patient were having trouble with supplies especially the border foam. He has home health. 02-24-2022 upon evaluation today patient appears to be doing better in regard to his wound. The sacral area is looking like it is trying to break down a little bit is not actually open right now but we definitely need to be careful in this regard. I discussed that with him today. With that being said the patient tells me that he definitely is not changing positions as frequently as he should and I think that is the primary cause of what we are seeing here. Fortunately there does not appear to be any signs of infection locally or systemically at this time. No fevers, chills, nausea, vomiting, or diarrhea. 03-11-2022 upon evaluation today patient is actually making excellent improvement in general. I am extremely pleased with where things stand I do believe that he is headed on the right direction here. 03-25-2022 upon evaluation today patient appears to be doing well currently in  regard to his wounds for the most part although the sacral area appears to be a little bit more broken down than what we would have expected. Fortunately there does not appear to be any signs of infection he tells me that he is not exactly  sure what happened that caused this to breakdown to the degree that it is. Fortunately there does not appear to be any signs of active infection locally nor systemically at this time which is great news. No fevers, chills, nausea, vomiting, or diarrhea. 04-22-2022 upon evaluation today patient's wound actually appears to be doing well on the left and the gluteal fold location. With that being said in regard to the sacral area this is open and a little bit more slough covered at this point. Fortunately I do not see any signs of infection locally nor systemically which is great news and overall I am extremely pleased with where things stand today. Overall I am very happy with where we are and I do think that he can get the sacral area he will get again but we need to keep a close eye on everything for sure. He does tell me he is good to be more aggressive with appropriate offloading which I think is definitely a good way to go. 05-06-2022 upon evaluation today patient appears to be doing well currently in regard to his wound on the sacral area though it is a little bit hyper granulated at this point unfortunately. Otherwise this seems to be doing overall decently well. In general I think that we are headed in the right direction. In regards to the wound in the left gluteal region this does have a little bit of depth to it but still seems to be showing signs of improvement which is great news and I am pleased in that regard. 05-20-2022 upon evaluation today patient appears to be doing better in regard to his wounds although he tells me he has been having to make to as he did not have the supplies ordered that he needed for dressing changes. Fortunately there does not  appear to be any signs of active infection locally nor systemically which is great news. No fevers, chills, nausea, vomiting, or diarrhea. 06-03-2022 upon evaluation today patient actually appears to be doing excellent in fact his sacral wound is completely healed after the silver nitrate 2 weeks ago. Fortunately I do not see any signs of active infection locally nor systemically at this time. 06-24-2022 upon evaluation today patient actually appears to be making excellent progress in regard to his wounds. He just has 1 left on the left hip/trochanter location and this 1 remaining spot is very tiny just a pinpoint. 07-15-2022 upon evaluation today patient appears to be doing well with regard to his wounds. In fact the wound remaining is almost completely closed and is showing signs of significant improvement. I am very pleased in that regard. There is no signs of active infection at this point. 07-29-2022 upon evaluation today patient appears to be doing well currently in regard to his wound. He has been tolerating the dressing changes without complication. Fortunately there does not appear to be any signs of active infection which is great news and overall I am extremely pleased with where things are currently. 08-26-2022 upon evaluation today patient appears to be doing well currently in regard to his wound. He has been tolerating the dressing changes without complication. Fortunately there does not appear to be any signs of active infection locally nor systemically which is great news. No fevers, chills, nausea, vomiting, or diarrhea. 09-09-2022 upon evaluation today patient appears to be doing well currently in regard to his wound I think we are very close to healing his ulcer in regard to use of the collagen. He does have  a little bit of breakdown around the edges of the wound I think some Calmoseptine over this area could be beneficial for him. Fortunately I do not see any evidence overall of worsening  which is great news. No fevers, chills, nausea, vomiting, or diarrhea. 09-30-2022 upon evaluation today patient appears to be doing well currently in regard to his wound. He has been tolerating the dressing changes without complication. Fortunately I do not see any signs of infection in fact all the previous wounds are pretty much healed except for a little area of irritation beside where he had the deeper wound previous. Fortunately I do not see any evidence of active infection locally or systemically at this time. 7/23; since the patient was last here the left buttock wound is actually healed however unfortunately we have had a reopening of the wound on the sacrum. He says this has been open for about 2 weeks he has been using some leftover Hydrofera Blue that his family is changing. He had managed to correct the issues he was having with his bed and his surface. 11-11-2022 upon evaluation today patient appears to be doing well currently in regard to his wounds and the ischial/gluteal locations. Has been tolerating the dressing changes without complication. Fortunately there does not appear to be any signs of active infection locally or systemically which is great news and overall I am extremely pleased with where we stand at this point. The sacral area unfortunately reopened in the interim since I last saw him I think a lot of this has to do with the bed not functioning properly at event a few weeks since I saw the patient and then he saw Dr. Leanord Hawking when I was out a couple of weeks ago. Nonetheless I think that he is moving in the right direction nonetheless with what we see currently in the bed is in place now and appropriate. [name], [name] [name] ([id number]) [id number] Page 10 of 12 Constitutional Well-nourished and well-hydrated in no acute distress. Vitals Time Taken: 12:15 PM, Height: 73 in, Weight: 154 lbs, BMI: 20.3, Temperature: 98.1 F, Pulse: 128 bpm,  Respiratory Rate: 18 breaths/min, Blood Pressure: 123/98 mmHg. Respiratory normal breathing without difficulty. Psychiatric this patient is able to make decisions and demonstrates good insight into disease process. Alert and Oriented x 3. pleasant and cooperative. General Notes: Upon inspection patient's wounds in the ischial locations actually both appear to be doing pretty well there is a little irritation on the left side but this is minimal and not even where the original one was but on the perimeter of this area. With regard to the sacral wound this is can require some sharp debridement I did perform debridement today clearly necrotic debris tolerated that without complication postdebridement wound bed is significantly improved. Integumentary (Hair, Skin) Wound #11 status is Open. Original cause of wound was Pressure Injury. The date acquired was: 10/21/2022. The wound has been in treatment 2 weeks. The wound is located on the Midline Coccyx. The wound measures 3.2cm length x 2.8cm width x 0.1cm depth; 7.037cm^2 area and 0.704cm^3 volume. There is Fat Layer (Subcutaneous Tissue) exposed. There is a medium amount of serosanguineous drainage noted. There is medium (34-66%) red, pink granulation within the wound bed. There is a medium (34-66%) amount of necrotic tissue within the wound bed including Adherent Slough. Wound #8 status is Open. Original cause of wound was Pressure Injury. The date acquired was: 09/05/2021. The wound has been in treatment 56 weeks. The wound is located  on the Left Gluteus. The wound measures 0.7cm length x 0.3cm width x 0.1cm depth; 0.165cm^2 area and 0.016cm^3 volume. There is Fat Layer (Subcutaneous Tissue) exposed. There is a medium amount of serosanguineous drainage noted. There is large (67-100%) pink granulation within the wound bed. There is a small (1-33%) amount of necrotic tissue within the wound bed. Assessment Active Problems ICD-10 Pressure ulcer of  sacral region, stage 3 Pressure ulcer of other site, stage 3 Quadriplegia, C5-C7 incomplete Gastrostomy status Procedures Wound #11 Pre-procedure diagnosis of Wound #11 is a Pressure Ulcer located on the Midline Coccyx . There was a Excisional Skin/Subcutaneous Tissue Debridement with a total area of 7.03 sq cm performed by Allen Derry, PA-C. With the following instrument(s): Curette to remove Viable and Non-Viable tissue/material. Material removed includes Subcutaneous Tissue, Slough, and Biofilm. No specimens were taken. A time out was conducted at 12:59, prior to the start of the procedure. A Moderate amount of bleeding was controlled with Pressure. The procedure was tolerated well with a pain level of 0 throughout and a pain level of 0 following the procedure. Post Debridement Measurements: 3.2cm length x 2.8cm width x 0.1cm depth; 0.704cm^3 volume. Post debridement Stage noted as Category/Stage III. Character of Wound/Ulcer Post Debridement is stable. Post procedure Diagnosis Wound #11: Same as Pre-Procedure Plan Follow-up Appointments: Return Appointment in 2 weeks. Nurse Visit as needed Home Health: Home Health Company: - [facility] for wound care. May utilize formulary equivalent dressing for wound treatment orders unless otherwise specified. Home Health Nurse may visit PRN to address patients wound care needs. Scheduled days for dressing changes to be completed; exception, patient has scheduled wound care visit that day. **Please direct any NON-WOUND related issues/requests for orders to patient's Primary Care Physician. **If current dressing causes regression in wound condition, may D/C ordered dressing product/s and apply Normal Saline Moist Dressing daily until next Wound Healing Center or Other MD appointment. **Notify Wound Healing Center of regression in wound condition at [phone]. Bathing/ Shower/ Hygiene: Wash wounds with antibacterial soap and  water. May shower; gently cleanse wound with antibacterial soap, rinse and pat dry prior to dressing wounds No tub bath. Off-Loading: Gel mattress overlay (Group 1) Low air-loss mattress (Group 2) Air fluidized (Group 3) Turn and reposition every 2 hours Other: - relive pressure from heels on bed Stockham, Kenta [name] ([id number]) [id number] Page 11 of 12 WOUND #11: - Coccyx Wound Laterality: Midline Cleanser: Soap and Water (Generic) 3 x Per Week/30 Days Discharge Instructions: Gently cleanse wound with antibacterial soap, rinse and pat dry prior to dressing wounds Prim Dressing: Hydrofera Blue Ready Transfer Foam, 2.5x2.5 (in/in) (Dispense As Written) 3 x Per Week/30 Days ary Discharge Instructions: Apply Hydrofera Blue Ready to wound bed as directed Secondary Dressing: (BORDER) Zetuvit Plus SILICONE BORDER Dressing 5x5 (in/in) (Generic) 3 x Per Week/30 Days Discharge Instructions: Please do not put silicone bordered dressings under wraps. Use non-bordered dressing only. WOUND #8: - Gluteus Wound Laterality: Left Cleanser: Soap and Water (Generic) 3 x Per Week/30 Days Discharge Instructions: Gently cleanse wound with antibacterial soap, rinse and pat dry prior to dressing wounds Prim Dressing: Hydrofera Blue Ready Transfer Foam, 2.5x2.5 (in/in) (Dispense As Written) 3 x Per Week/30 Days ary Discharge Instructions: Apply Hydrofera Blue Ready to wound bed as directed Secondary Dressing: (BORDER) Zetuvit Plus SILICONE BORDER Dressing 5x5 (in/in) (Generic) 3 x Per Week/30 Days Discharge Instructions: Please do not put silicone bordered dressings under wraps. Use non-bordered dressing only. 1. I am recommending  that the patient continue to monitor for any signs of infection or worsening. Based on what I am seeing I do believe that we make an excellent headway towards complete closure. 2. I am also can recommend that the patient should continue with the [facility] dressing which I think is doing well. 3. I would also recommend that the patient continue with border foam dressing to cover an appropriate offloading. We will see patient back for reevaluation in 1 week here in the clinic. If anything worsens or changes patient will contact our office for additional recommendations. Electronic Signature(s) Signed: 11/11/2022 1:35:29 PM By: Allen Derry PA-C Entered By: Allen Derry on 11/11/2022 13:35:28 -------------------------------------------------------------------------------- SuperBill Details Patient Name: Date of Service: [name], [place] 11/11/2022 Medical Record Number: [id number] Patient Account Number: [id number] Date of Birth/Sex: Treating RN: 1965-04-08 (57 y.o. [name] Primary Care Provider: Pearson Grippe Other Clinician: Referring Provider: Treating Provider/Extender: Cleotis Lema in Treatment: 56 Diagnosis Coding ICD-10 Codes Code Description [phone] Pressure ulcer of sacral region, stage 3 L89.893 Pressure ulcer of other site, stage 3 G82.54 Quadriplegia, C5-C7 incomplete Z93.1 Gastrostomy status Facility Procedures : CPT4 Code: [id number] Description: 11042 - DEB SUBQ TISSUE 20 SQ CM/< ICD-10 Diagnosis Description L89.153 Pressure ulcer of sacral region, stage 3 Modifier: Quantity: 1 Physician Procedures : CPT4 Code Description Modifier [id number] 11042 - WC PHYS SUBQ TISS 20 SQ CM ICD-10 Diagnosis Description L89.153 Pressure ulcer of sacral region, stage 3 Corl, Dyllan [name] ([id number]) [id number] Page 12 of 1 Quantity: 1 2 Electronic Signature(s) Signed: 11/11/2022 1:35:38 PM By: Allen Derry PA-C Entered By: Allen Derry on 11/11/2022 13:35:38

## 2022-11-25 ENCOUNTER — Encounter: Payer: Medicare Other | Admitting: Physician Assistant

## 2022-11-25 DIAGNOSIS — L89153 Pressure ulcer of sacral region, stage 3: Secondary | ICD-10-CM | POA: Diagnosis not present
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
## 2022-11-28 ENCOUNTER — Ambulatory Visit: Payer: Medicare Other | Admitting: Gastroenterology

## 2022-11-28 NOTE — Progress Notes (Signed)
YASIM, DERCK [name] ([id number]) [id number] Page 1 of 12 Visit Report for 11/25/2022 Chief Complaint Document Details Patient Name: Date of Service: [name], [name] 11/25/2022 12:15 PM Medical Record Number: [id number] Patient Account Number: [id number] Date of Birth/Sex: Treating RN: 1966-03-29 (57 y.o. [name] Primary Care Provider: Marylynn Pearson Other Clinician: Referring Provider: Treating Provider/Extender: Cleotis Lema in Treatment: 41 Information Obtained from: Patient Chief Complaint Sacral and back pressure ulcers Electronic Signature(s) Signed: 11/25/2022 6:45:54 PM By: Allen Derry PA-C Entered By: Allen Derry on 11/25/2022 46:96:29 -------------------------------------------------------------------------------- Debridement Details Patient Name: Date of Service: [address] [place] [name]. 11/25/2022 12:15 PM Medical Record Number: [id number] Patient Account Number: [id number] Date of Birth/Sex: Treating RN: Jan 03, 1966 (57 y.o. [name] Primary Care Provider: Marylynn Pearson Other Clinician: Referring Provider: Treating Provider/Extender: Cleotis Lema in Treatment: 58 Debridement Performed for Assessment: Wound #11 Midline Coccyx Performed By: Physician Allen Derry, PA-C Debridement Type: Debridement Level of Consciousness (Pre-procedure): Awake and Alert Pre-procedure Verification/Time Out Yes - 12:59 Taken: Start Time: 12:59 Percent of Wound Bed Debrided: 100% T Area Debrided (cm): otal 5.93 Tissue and other material debrided: Viable, Slough, Subcutaneous, Biofilm, Slough Level: Skin/Subcutaneous Tissue Debridement Description: Excisional Instrument: Curette Bleeding: Large Hemostasis Achieved: Silver Nitrate Procedural Pain: 0 Post Procedural Pain: 0 Response to Treatment: Procedure was tolerated well Level of Consciousness [name], [name] ([id number])  [id number] Page 2 of 12 Level of Consciousness (Post- Awake and Alert procedure): Post Debridement Measurements of Total Wound Length: (cm) 2.8 Stage: Category/Stage III Width: (cm) 2.7 Depth: (cm) 0.1 Volume: (cm) 0.594 Character of Wound/Ulcer Post Debridement: Stable Post Procedure Diagnosis Same as Pre-procedure Electronic Signature(s) Signed: 11/25/2022 6:45:54 PM By: Allen Derry PA-C Signed: 11/28/2022 1:24:46 PM By: Midge Aver MSN RN CNS WTA Entered By: Midge Aver on 11/25/2022 10:01:00 -------------------------------------------------------------------------------- HPI Details Patient Name: Date of Service: [address], [place] [name]. 11/25/2022 12:15 PM Medical Record Number: [id number] Patient Account Number: [id number] Date of Birth/Sex: Treating RN: 13-May-1965 (57 y.o. [name] Primary Care Provider: Marylynn Pearson Other Clinician: Referring Provider: Treating Provider/Extender: Cleotis Lema in Treatment: 58 History of Present Illness ssociated Signs and Symptoms: Patient has a history of confirmed osteomyelitis of the left foot according to MRI July 2019. 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 HPI Description: 12/08/17 on evaluation today patient presents for initial evaluation and our clinic concerning issues he has been having with his feet bilaterally although the left more than right most recently. He was admitted to hospital where he was placed on IV vancomycin which actually he continue to utilize until discharge and even when discharged continue to be on until around 16 August according to what he tells me. His PICC line was removed at that point. Nonetheless he has been seen in the wound care center in [facility] before transferring to [place] due to the fact that the doctor there left and they are no longer open. Nonetheless he does  have significant osteomyelitis of the left foot noted on MRI which revealed significant issues with necrotic bone including a portion of the distal region of his left great toe which was felt to possibly either be missing as a result of amputation or potentially resorption. Either way he also had osteomyelitis noted of the digit, metatarsal region, cuneiform, and navicular bones. There is definite bone exposure noted externally at this point in  time. In regard to the right foot he has issues with ulcerations here as well although he states that recently he's had no issues until this just blistered and reopened. Apparently Xeroform was used in this has caused things to be somewhat more moist and open more according to the patient. He's otherwise been tolerating the dressing changes without complication using silver alginate dressings. He has no discomfort but does seem to be extremely stressed regarding the fact that he did see Dr. 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 The patient stated per notes reviewed that he did not want to have any surgery and therefore has a repeat appointment scheduled with the surgeon on 12/15/17. With all that being said he feels like that he really has not been alerted to what was going on in the severity that was until just recently and has a lot of questions in that regard. I'll be see I explained that seeing him for the first time today I cannot answer a lot of those questions that he has. 12/21/17 on evaluation today patient actually appears to be doing a little better in regard to the right foot in particular. There is one area where there still definitive bone noted although the MRI which I did review today that we had ordered was negative for any evidence of osteomyelitis which is good news. Nonetheless there was some necrotic bone noted. Overall the patient appears to be doing fairly well however in regard to the right foot.  Unfortunately he does have a new open area on the posterior aspect of his left leg as well as the continued area of necrosis noted in the central portion of the foot. He states that he's actually going to be sent for reevaluation specifically to a limb salvage clinic at [facility] he believes this is something that is primary care provider has been working with. They were just awaiting the results of the MRI. 01/08/18 on evaluation today patient actually appears to be doing maybe just a little better in regard to the overall appearance of his bilateral foot ulcers. In general this does not seem to have worsened at least which is good news. He still has evidence of bone noted on the surface of both wound areas which though dry appearing really has not otherwise changed at this time. He actually has an appointment at [facility] with the wound center to discuss limb salvage. He discusses with me at the last visit. Nonetheless he was somewhat upset today about a couple things one being that he stated that we did not put the order in for the protective dressing for his knees that we discussed last time. With that being said I had put in the order for the protective dressing in fact it was on his order sheet that was signed on 12/21/17. Nonetheless unfortunately it appears this was overlooked by home health and they told him if they had the order they could put the protective dressing on. Nonetheless they told him they did not have the order. Again I believe this was an oversight nothing intentional on anyone's part nonetheless the order was there. Still I had explained to the patient sometimes insurance will not cover for the protective dressing even if it is ordered and this was discussed in the last visit. 01/25/18 on evaluation today patient presents for follow-up concerning his bilateral lower extremity ulcers. He has actually been doing about the same since I last saw him. We do  have them in the room we  can look at his gluteal region today as well to ensure that there is nothing open or if so address the issue there. Nonetheless he was supposed to see you and see wound care/limb salvage prior to his visit with me today. Unfortunately when he arrived last time at their [place], [place] [name] ([id number]) [id number] Page 3 of 12 clinic he apparently was there on the wrong day the doctor was not even present that he was his to be seen. Nonetheless I had to be switched to a different day and the patient actually is going in the next week to see them. Assuming everything works out this should actually be tomorrow. Nonetheless if they take over care of that point then we will subsequently release care to them. Readmission: 10-15-2021 upon evaluation today patient appears to be doing somewhat poorly in regard to wounds over the left gluteal region. He also has areas in the midline sacrum although this is not quite as bad. There is however some evidence of necrotic bone noted at this point unfortunately. The question is whether this is just something working its way out or if it something that is actually actively infected at this point. We are going to have to evaluate that further little by little as we go with additional diagnostics. With that being said the patient tells me currently that what we are seeing him for has been open since around 09-05-2021 for the gluteal area on the left and in regard to the sacrum 10-01-2021 he states. With that being said he again is not too significant as far as the overall appearance of the wounds are concerned but again with the bone exposed this is something that we may want to do at pathology and culture in regard to. 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 This is a gentleman whom I have previously seen back in 2019. The  wound that we were taking care of back at that time is completely cleared. 7/25; 2-week follow-up. The patient has 3 small open areas in the lower sacrum and then an area over the left ischial tuberosity. Both of these are stage III wounds. He has been using Hydrofera Blue. 11-12-2021 upon evaluation today patient appears to be doing well currently in regard to his wounds. The distal sacral area is almost completely closed and looks to be doing great. In regard to the left gluteal region this is showing signs of improvement from a depth perspective size is about the same. Overall I am extremely pleased with where things stand compared to what we were previous. 12-03-2021 upon evaluation today patient appears to be doing well currently in regard to his wound. He is actually showing signs of improvement the sacral region and the right ischial/gluteal region is completely healed. The left ischial/gluteal region is still open but seems to be doing much better. 12-24-2021 upon evaluation today patient appears to be doing well currently in regard to some of his wounds others have reopened or are not doing quite as well. Overall though I think that he is on a good track he just may be needs to be a little bit better on appropriate offloading we discussed that today as well. 01-27-2022 upon evaluation today patient appears to be doing well currently in regard to his wound. He is not showing any signs of significant tissue breakdown but  unfortunately the wound is larger than what it was last time I saw him. He tells me that this seems to be a complete shock based on what he is hearing today. He tells me that his son was telling him this has been doing well and that it was smaller and not really bleeding or draining much. Again this is definitely significantly larger than last time I saw him but does not appear to be obviously infected there is some undermining noted as well. In general I am unsure of what would  have caused such a dramatic changes what he seems to feel has happened here but nonetheless indeed I do not feel like that this is doing nearly as well as last time I saw him. 11/6; his wound measured better today per our intake nurse using silver alginate and border foam although according to the patient were having trouble with supplies especially the border foam. He has home health. 02-24-2022 upon evaluation today patient appears to be doing better in regard to his wound. The sacral area is looking like it is trying to break down a little bit is not actually open right now but we definitely need to be careful in this regard. I discussed that with him today. With that being said the patient tells me that he definitely is not changing positions as frequently as he should and I think that is the primary cause of what we are seeing here. Fortunately there does not appear to be any signs of infection locally or systemically at this time. No fevers, chills, nausea, vomiting, or diarrhea. 03-11-2022 upon evaluation today patient is actually making excellent improvement in general. I am extremely pleased with where things stand I do believe that he is headed on the right direction here. 03-25-2022 upon evaluation today patient appears to be doing well currently in regard to his wounds for the most part although the sacral area appears to be a little bit more broken down than what we would have expected. Fortunately there does not appear to be any signs of infection he tells me that he is not exactly sure what happened that caused this to breakdown to the degree that it is. Fortunately there does not appear to be any signs of active infection locally nor systemically at this time which is great news. No fevers, chills, nausea, vomiting, or diarrhea. 04-22-2022 upon evaluation today patient's wound actually appears to be doing well on the left and the gluteal fold location. With that being said in regard to  the sacral area this is open and a little bit more slough covered at this point. Fortunately I do not see any signs of infection locally nor systemically which is great news and overall I am extremely pleased with where things stand today. Overall I am very happy with where we are and I do think that he can get the sacral area he will get again but we need to keep a close eye on everything for sure. He does tell me he is good to be more aggressive with appropriate offloading which I think is definitely a good way to go. 05-06-2022 upon evaluation today patient appears to be doing well currently in regard to his wound on the sacral area though it is a little bit hyper granulated at this point unfortunately. Otherwise this seems to be doing overall decently well. In general I think that we are headed in the right direction. In regards to the wound in the left gluteal region  this does have a little bit of depth to it but still seems to be showing signs of improvement which is great news and I am pleased in that regard. 05-20-2022 upon evaluation today patient appears to be doing better in regard to his wounds although he tells me he has been having to make to as he did not have the supplies ordered that he needed for dressing changes. Fortunately there does not appear to be any signs of active infection locally nor systemically which is great news. No fevers, chills, nausea, vomiting, or diarrhea. 06-03-2022 upon evaluation today patient actually appears to be doing excellent in fact his sacral wound is completely healed after the silver nitrate 2 weeks ago. Fortunately I do not see any signs of active infection locally nor systemically at this time. 06-24-2022 upon evaluation today patient actually appears to be making excellent progress in regard to his wounds. He just has 1 left on the left hip/trochanter location and this 1 remaining spot is very tiny just a pinpoint. 07-15-2022 upon evaluation today  patient appears to be doing well with regard to his wounds. In fact the wound remaining is almost completely closed and is showing signs of significant improvement. I am very pleased in that regard. There is no signs of active infection at this point. 07-29-2022 upon evaluation today patient appears to be doing well currently in regard to his wound. He has been tolerating the dressing changes without complication. Fortunately there does not appear to be any signs of active infection which is great news and overall I am extremely pleased with where things are currently. 08-26-2022 upon evaluation today patient appears to be doing well currently in regard to his wound. He has been tolerating the dressing changes without complication. Fortunately there does not appear to be any signs of active infection locally nor systemically which is great news. No fevers, chills, nausea, vomiting, or diarrhea. 09-09-2022 upon evaluation today patient appears to be doing well currently in regard to his wound I think we are very close to healing his ulcer in regard to use of the collagen. He does have a little bit of breakdown around the edges of the wound I think some Calmoseptine over this area could be beneficial for him. Fortunately I do not see any evidence overall of worsening which is great news. No fevers, chills, nausea, vomiting, or diarrhea. 09-30-2022 upon evaluation today patient appears to be doing well currently in regard to his wound. He has been tolerating the dressing changes without complication. Fortunately I do not see any signs of infection in fact all the previous wounds are pretty much healed except for a little area of irritation beside where he had the deeper wound previous. Fortunately I do not see any evidence of active infection locally or systemically at this time. 7/23; since the patient was last here the left buttock wound is actually healed however unfortunately we have had a reopening of the  wound on the sacrum. He AINSLEY, BEAVERSON [name] ([id number]) [id number] Page 4 of 12 says this has been open for about 2 weeks he has been using some leftover Hydrofera Blue that his family is changing. He had managed to correct the issues he was having with his bed and his surface. 11-11-2022 upon evaluation today patient appears to be doing well currently in regard to his wounds and the ischial/gluteal locations. Has been tolerating the dressing changes without complication. Fortunately there does not appear to be any signs of active infection locally  or systemically which is great news and overall I am extremely pleased with where we stand at this point. The sacral area unfortunately reopened in the interim since I last saw him I think a lot of this has to do with the bed not functioning properly at event a few weeks since I saw the patient and then he saw Dr. Leanord Hawking when I was out a couple of weeks ago. Nonetheless I think that he is moving in the right direction nonetheless with what we see currently in the bed is in place now and appropriate. 11-25-2022 upon evaluation today patient appears to be doing decently well currently in regard to his wounds. He has been tolerating the dressing changes without complication. Fortunately there does not appear to be any signs of active infection locally nor systemically at this time. I do not see any signs of active infection at this time. Electronic Signature(s) Signed: 11/25/2022 6:21:20 PM By: Allen Derry PA-C Entered By: Allen Derry on 11/25/2022 15:21:20 -------------------------------------------------------------------------------- Physical Exam Details Patient Name: Date of Service: TYLAND, REAGLE 11/25/2022 12:15 PM Medical Record Number: [id number] Patient Account Number: [id number] Date of Birth/Sex: Treating RN: Jan 18, 1966 (57 y.o. [name] Primary Care Provider: Marylynn Pearson Other  Clinician: Referring Provider: Treating Provider/Extender: Cleotis Lema in Treatment: 69 Constitutional Well-nourished and well-hydrated in no acute distress. Respiratory normal breathing without difficulty. Psychiatric this patient is able to make decisions and demonstrates good insight into disease process. Alert and Oriented x 3. pleasant and cooperative. Notes Upon inspection patient's wound bed actually showed signs of good granulation epithelization at this point. Fortunately I do not see any signs of infection he does have some hypergranulation however the sacral area and I feel like this is going require sharp debridement. I discussed that with the patient today we are going to proceed with a debridement at this point. Electronic Signature(s) Signed: 11/25/2022 6:21:40 PM By: Allen Derry PA-C Entered By: Allen Derry on 11/25/2022 15:21:40 -------------------------------------------------------------------------------- Physician Orders Details Patient Name: Date of Service: [address] 11/25/2022 12:15 PM Medical Record Number: [id number] Patient Account Number: [id number] Date of Birth/Sex: Treating RN: [birth year] (57 y.o. Shelly Bombard, Aveon [name] ([id number]) [id number] Page 5 of 12 Primary Care Provider: Marylynn Pearson Other Clinician: Referring Provider: Treating Provider/Extender: Cleotis Lema in Treatment: 63 Verbal / Phone Orders: No Diagnosis Coding ICD-10 Coding Code Description L89.153 Pressure ulcer of sacral region, stage 3 L89.893 Pressure ulcer of other site, stage 3 G82.54 Quadriplegia, C5-C7 incomplete Z93.1 Gastrostomy status Follow-up Appointments Return Appointment in 2 weeks. Nurse Visit as needed Home Health Home Health Company: - [facility] for wound care. May utilize formulary equivalent dressing for wound treatment orders unless otherwise  specified. Home Health Nurse may visit PRN to address patients wound care needs. Scheduled days for dressing changes to be completed; exception, patient has scheduled wound care visit that day. **Please direct any NON-WOUND related issues/requests for orders to patient's Primary Care Physician. **If current dressing causes regression in wound condition, may D/C ordered dressing product/s and apply Normal Saline Moist Dressing daily until next Wound Healing Center or Other MD appointment. **Notify Wound Healing Center of regression in wound condition at [phone]. Bathing/ [employer] wounds with antibacterial soap and water. May shower; gently cleanse wound with antibacterial soap, rinse and pat dry prior to dressing wounds No tub bath. Off-Loading Gel mattress overlay (Group 1) Low air-loss mattress (Group 2) A  fluidized (Group 3) ir Turn and reposition every 2 hours Other: - relive pressure from heels on bed Wound Treatment Wound #11 - Coccyx Wound Laterality: Midline Cleanser: Soap and Water (Generic) 3 x Per Week/30 Days Discharge Instructions: Gently cleanse wound with antibacterial soap, rinse and pat dry prior to dressing wounds Prim Dressing: Hydrofera Blue Ready Transfer Foam, 2.5x2.5 (in/in) (Dispense As Written) 3 x Per Week/30 Days ary Discharge Instructions: Apply Hydrofera Blue Ready to wound bed as directed Secondary Dressing: (BORDER) Zetuvit Plus SILICONE BORDER Dressing 5x5 (in/in) (Generic) 3 x Per Week/30 Days Discharge Instructions: Please do not put silicone bordered dressings under wraps. Use non-bordered dressing only. Wound #8 - Gluteus Wound Laterality: Left Cleanser: Soap and Water (Generic) 3 x Per Week/30 Days Discharge Instructions: Gently cleanse wound with antibacterial soap, rinse and pat dry prior to dressing wounds Prim Dressing: Hydrofera Blue Ready Transfer Foam, 2.5x2.5 (in/in) (Dispense As Written) 3 x Per Week/30 Days ary Discharge  Instructions: Apply Hydrofera Blue Ready to wound bed as directed Secondary Dressing: (BORDER) Zetuvit Plus SILICONE BORDER Dressing 5x5 (in/in) (Generic) 3 x Per Week/30 Days Discharge Instructions: Please do not put silicone bordered dressings under wraps. Use non-bordered dressing only. Electronic Signature(s) Signed: 11/25/2022 6:45:54 PM By: Allen Derry PA-C Signed: 11/28/2022 1:24:46 PM By: Midge Aver MSN RN CNS WTA Entered By: Midge Aver on 11/25/2022 10:02:37 [name] ([id number]) [id number] Page 6 of 12 Prescription 11/25/2022 -------------------------------------------------------------------------------- Marquita Palms PA-C Patient Name: Provider: 11/22/1965 [id number] Date of Birth: NPI#: Judie Petit [id number] Sex: DEA #: [phone] [phone] Phone #: License #: UPN: Patient Address: [address] [facility] [place], [place] [address] [facility] [address] [place], [place] [address] [phone] Allergies niacin; shrimp Provider's Orders Gel mattress overlay (Group 1) Hand Signature: Date(s): Prescription 11/25/2022 Marquita Palms PA-C Patient Name: Provider: 30-Mar-1966 [id number] Date of Birth: NPI#: Judie Petit [id number] Sex: DEA #: [phone] Phone #: License #: UPN: Patient Address: [address] [facility] [place], [place] [address] [facility] [address] [place], [place] [address] [phone] Allergies niacin; shrimp Provider's Orders Low air-loss mattress (Group 2) Hand Signature: Date(s): Prescription 11/25/2022 Marquita Palms PA-C Patient Name: Provider: 11/14/1965 [id number] Date of Birth: NPI#: Judie Petit [id number] Sex: DEA #: [phone] Phone #: License #: UPN: Patient Address: [address] [facility] [place], [place] [address] [facility] [address] [place], [place] [address] [phone] Allergies niacin; shrimp [name], [name] ([id number]) [id number] Page 7 of 12 Provider's Orders A fluidized (Group 3) ir Hand Signature: Date(s): Electronic Signature(s) Signed: 11/25/2022 6:45:54 PM By: Allen Derry PA-C Signed: 11/28/2022 1:24:46 PM By: Midge Aver MSN RN CNS WTA Entered By: Midge Aver on 11/25/2022 10:02:37 -------------------------------------------------------------------------------- Problem List Details Patient Name: Date of Service: [name], [place] [name]. 11/25/2022 12:15 PM Medical Record Number: [id number] Patient Account Number: [id number] Date of Birth/Sex: Treating RN: 24-Mar-1966 (57 y.o. 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, C5-C7 incomplete 10/15/2021 No Yes Z93.1 Gastrostomy status 10/15/2021 No Yes Inactive Problems Resolved Problems  Electronic Signature(s) Signed: 11/25/2022 6:45:54 PM By: Allen Derry PA-C Entered By: Allen Derry on 11/25/2022 09:27:58 [name] ([id number]) [id number] Page 8 of 12 -------------------------------------------------------------------------------- Progress Note Details Patient Name: Date of Service: [name], [name] 11/25/2022 12:15 PM Medical Record Number: [id number] Patient Account Number: [id number] Date of Birth/Sex: Treating RN: 04-19-1965 (57 y.o. [name] Primary Care Provider:  Marylynn Pearson Other Clinician: Referring Provider: Treating Provider/Extender: Cleotis Lema in Treatment: 58 Subjective Chief Complaint Information obtained from Patient Sacral and back pressure ulcers History of Present Illness (HPI) The following HPI elements were documented for the patient's wound: Associated Signs and Symptoms: Patient has a history of confirmed osteomyelitis of the left foot according to MRI July 2019. 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 12/08/17 on evaluation today patient presents for initial evaluation and our clinic concerning issues he has been having with his feet bilaterally although the left more than right most recently. He was admitted to hospital where he was placed on IV vancomycin which actually he continue to utilize until discharge and even when discharged continue to be on until around 16 August according to what he tells me. His PICC line was removed at that point. Nonetheless he has been seen in the wound care center in [facility] before transferring to [place] due to the fact that the doctor there left and they are no longer open. Nonetheless he does have significant osteomyelitis of the left foot noted on MRI which revealed significant issues with necrotic bone including a portion of the distal region of his left great toe which was felt to possibly either be missing as a result of amputation or potentially resorption. Either way he also had osteomyelitis noted of the digit, metatarsal region, cuneiform, and navicular bones. There is definite bone exposure noted externally at this point in time. In regard to the right foot he has issues with ulcerations here as well although he states that recently he's had no issues until this just blistered and reopened. Apparently Xeroform was used in this has caused things to be somewhat more moist and open more according  to the patient. He's otherwise been tolerating the dressing changes without complication using silver alginate dressings. He has no discomfort but does seem to be extremely stressed regarding the fact that he did see Dr. 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 The patient stated per notes reviewed that he did not want to have any surgery and therefore has a repeat appointment scheduled with the surgeon on 12/15/17. With all that being said he feels like that he really has not been alerted to what was going on in the severity that was until just recently and has a lot of questions in that regard. I'll be see I explained that seeing him for the first time today I cannot answer a lot of those questions that he has. 12/21/17 on evaluation today patient actually appears to be doing a little better in regard to the right foot in particular. There is one area where there still definitive bone noted although the MRI which I did review today that we had ordered was negative for any evidence of osteomyelitis which is good news. Nonetheless there was some necrotic bone noted. Overall the patient appears to be doing fairly well however in regard to the right foot. Unfortunately he does have a new open area on  the posterior aspect of his left leg as well as the continued area of necrosis noted in the central portion of the foot. He states that he's actually going to be sent for reevaluation specifically to a limb salvage clinic at [facility] he believes this is something that is primary care provider has been working with. They were just awaiting the results of the MRI. 01/08/18 on evaluation today patient actually appears to be doing maybe just a little better in regard to the overall appearance of his bilateral foot ulcers. In general this does not seem to have worsened at least which is good news. He still has evidence of bone noted on the surface of both wound areas  which though dry appearing really has not otherwise changed at this time. He actually has an appointment at [facility] with the wound center to discuss limb salvage. He discusses with me at the last visit. Nonetheless he was somewhat upset today about a couple things one being that he stated that we did not put the order in for the protective dressing for his knees that we discussed last time. With that being said I had put in the order for the protective dressing in fact it was on his order sheet that was signed on 12/21/17. Nonetheless unfortunately it appears this was overlooked by home health and they told him if they had the order they could put the protective dressing on. Nonetheless they told him they did not have the order. Again I believe this was an oversight nothing intentional on anyone's part nonetheless the order was there. Still I had explained to the patient sometimes insurance will not cover for the protective dressing even if it is ordered and this was discussed in the last visit. 01/25/18 on evaluation today patient presents for follow-up concerning his bilateral lower extremity ulcers. He has actually been doing about the same since I last saw him. We do have them in the room we can look at his gluteal region today as well to ensure that there is nothing open or if so address the issue there. Nonetheless he was supposed to see you and see wound care/limb salvage prior to his visit with me today. Unfortunately when he arrived last time at their clinic he apparently was there on the wrong day the doctor was not even present that he was his to be seen. Nonetheless I had to be switched to a different day and the patient actually is going in the next week to see them. Assuming everything works out this should actually be tomorrow. Nonetheless if they take over care of that point then we will subsequently release care to them. Readmission: 10-15-2021 upon evaluation today patient appears to be  doing somewhat poorly in regard to wounds over the left gluteal region. He also has areas in the midline sacrum although this is not quite as bad. There is however some evidence of necrotic bone noted at this point unfortunately. The question is whether this is just something working its way out or if it something that is actually actively infected at this point. We are going to have to evaluate that further little by little as we go with additional diagnostics. With that being said the patient tells me currently that what we are seeing him for has been open since around 09-05-2021 for the gluteal area on the left and in regard to the sacrum 10-01-2021 he states. With that being said he again is not too significant as far  as the overall appearance of the wounds are concerned but again with the bone exposed this is something that we may want to do at pathology and culture in regard to. 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 This is a gentleman whom I have previously seen back in 2019. The wound that we were taking care of back at that time is completely cleared. 7/25; 2-week follow-up. The patient has 3 small open areas in the lower sacrum and then an area over the left ischial tuberosity. Both of these are stage III wounds. He has been using Hydrofera Blue. 11-12-2021 upon evaluation today patient appears to be doing well currently in regard to his wounds. The distal sacral area is almost completely closed and looks [name], [name] ([id number]) [phone].pdf Page 9 of 12 to be doing great. In regard to the left gluteal region this is showing signs of improvement from a depth perspective size is about the same. Overall I am extremely pleased with where things stand compared to what we were previous. 12-03-2021 upon evaluation today patient appears to be  doing well currently in regard to his wound. He is actually showing signs of improvement the sacral region and the right ischial/gluteal region is completely healed. The left ischial/gluteal region is still open but seems to be doing much better. 12-24-2021 upon evaluation today patient appears to be doing well currently in regard to some of his wounds others have reopened or are not doing quite as well. Overall though I think that he is on a good track he just may be needs to be a little bit better on appropriate offloading we discussed that today as well. 01-27-2022 upon evaluation today patient appears to be doing well currently in regard to his wound. He is not showing any signs of significant tissue breakdown but unfortunately the wound is larger than what it was last time I saw him. He tells me that this seems to be a complete shock based on what he is hearing today. He tells me that his son was telling him this has been doing well and that it was smaller and not really bleeding or draining much. Again this is definitely significantly larger than last time I saw him but does not appear to be obviously infected there is some undermining noted as well. In general I am unsure of what would have caused such a dramatic changes what he seems to feel has happened here but nonetheless indeed I do not feel like that this is doing nearly as well as last time I saw him. 11/6; his wound measured better today per our intake nurse using silver alginate and border foam although according to the patient were having trouble with supplies especially the border foam. He has home health. 02-24-2022 upon evaluation today patient appears to be doing better in regard to his wound. The sacral area is looking like it is trying to break down a little bit is not actually open right now but we definitely need to be careful in this regard. I discussed that with him today. With that being said the patient tells me that  he definitely is not changing positions as frequently as he should and I think that is the primary cause of what we are seeing here. Fortunately there does not appear to be any signs of infection locally or systemically at this time. No fevers, chills,  nausea, vomiting, or diarrhea. 03-11-2022 upon evaluation today patient is actually making excellent improvement in general. I am extremely pleased with where things stand I do believe that he is headed on the right direction here. 03-25-2022 upon evaluation today patient appears to be doing well currently in regard to his wounds for the most part although the sacral area appears to be a little bit more broken down than what we would have expected. Fortunately there does not appear to be any signs of infection he tells me that he is not exactly sure what happened that caused this to breakdown to the degree that it is. Fortunately there does not appear to be any signs of active infection locally nor systemically at this time which is great news. No fevers, chills, nausea, vomiting, or diarrhea. 04-22-2022 upon evaluation today patient's wound actually appears to be doing well on the left and the gluteal fold location. With that being said in regard to the sacral area this is open and a little bit more slough covered at this point. Fortunately I do not see any signs of infection locally nor systemically which is great news and overall I am extremely pleased with where things stand today. Overall I am very happy with where we are and I do think that he can get the sacral area he will get again but we need to keep a close eye on everything for sure. He does tell me he is good to be more aggressive with appropriate offloading which I think is definitely a good way to go. 05-06-2022 upon evaluation today patient appears to be doing well currently in regard to his wound on the sacral area though it is a little bit hyper granulated at this point unfortunately.  Otherwise this seems to be doing overall decently well. In general I think that we are headed in the right direction. In regards to the wound in the left gluteal region this does have a little bit of depth to it but still seems to be showing signs of improvement which is great news and I am pleased in that regard. 05-20-2022 upon evaluation today patient appears to be doing better in regard to his wounds although he tells me he has been having to make to as he did not have the supplies ordered that he needed for dressing changes. Fortunately there does not appear to be any signs of active infection locally nor systemically which is great news. No fevers, chills, nausea, vomiting, or diarrhea. 06-03-2022 upon evaluation today patient actually appears to be doing excellent in fact his sacral wound is completely healed after the silver nitrate 2 weeks ago. Fortunately I do not see any signs of active infection locally nor systemically at this time. 06-24-2022 upon evaluation today patient actually appears to be making excellent progress in regard to his wounds. He just has 1 left on the left hip/trochanter location and this 1 remaining spot is very tiny just a pinpoint. 07-15-2022 upon evaluation today patient appears to be doing well with regard to his wounds. In fact the wound remaining is almost completely closed and is showing signs of significant improvement. I am very pleased in that regard. There is no signs of active infection at this point. 07-29-2022 upon evaluation today patient appears to be doing well currently in regard to his wound. He has been tolerating the dressing changes without complication. Fortunately there does not appear to be any signs of active infection which is great news and overall I am  extremely pleased with where things are currently. 08-26-2022 upon evaluation today patient appears to be doing well currently in regard to his wound. He has been tolerating the dressing changes  without complication. Fortunately there does not appear to be any signs of active infection locally nor systemically which is great news. No fevers, chills, nausea, vomiting, or diarrhea. 09-09-2022 upon evaluation today patient appears to be doing well currently in regard to his wound I think we are very close to healing his ulcer in regard to use of the collagen. He does have a little bit of breakdown around the edges of the wound I think some Calmoseptine over this area could be beneficial for him. Fortunately I do not see any evidence overall of worsening which is great news. No fevers, chills, nausea, vomiting, or diarrhea. 09-30-2022 upon evaluation today patient appears to be doing well currently in regard to his wound. He has been tolerating the dressing changes without complication. Fortunately I do not see any signs of infection in fact all the previous wounds are pretty much healed except for a little area of irritation beside where he had the deeper wound previous. Fortunately I do not see any evidence of active infection locally or systemically at this time. 7/23; since the patient was last here the left buttock wound is actually healed however unfortunately we have had a reopening of the wound on the sacrum. He says this has been open for about 2 weeks he has been using some leftover Hydrofera Blue that his family is changing. He had managed to correct the issues he was having with his bed and his surface. 11-11-2022 upon evaluation today patient appears to be doing well currently in regard to his wounds and the ischial/gluteal locations. Has been tolerating the dressing changes without complication. Fortunately there does not appear to be any signs of active infection locally or systemically which is great news and overall I am extremely pleased with where we stand at this point. The sacral area unfortunately reopened in the interim since I last saw him I think a lot of this has to do with  the bed not functioning properly at event a few weeks since I saw the patient and then he saw Dr. Leanord Hawking when I was out a couple of weeks ago. Nonetheless I think that he is moving in the right direction nonetheless with what we see currently in the bed is in place now and appropriate. 11-25-2022 upon evaluation today patient appears to be doing decently well currently in regard to his wounds. He has been tolerating the dressing changes without complication. Fortunately there does not appear to be any signs of active infection locally nor systemically at this time. I do not see any signs of active infection at this time. [name], [name] ([id number]) [id number] Page 10 of 12 Objective Constitutional Well-nourished and well-hydrated in no acute distress. Vitals Time Taken: 12:44 PM, Height: 73 in, Weight: 154 lbs, BMI: 20.3, Temperature: 98.1 F, Pulse: 138 bpm, Respiratory Rate: 18 breaths/min, Blood Pressure: 117/102 mmHg. Respiratory normal breathing without difficulty. Psychiatric this patient is able to make decisions and demonstrates good insight into disease process. Alert and Oriented x 3. pleasant and cooperative. General Notes: Upon inspection patient's wound bed actually showed signs of good granulation epithelization at this point. Fortunately I do not see any signs of infection he does have some hypergranulation however the sacral area and I feel like this is going require sharp debridement. I discussed that with the patient  today we are going to proceed with a debridement at this point. Integumentary (Hair, Skin) Wound #11 status is Open. Original cause of wound was Pressure Injury. The date acquired was: 10/21/2022. The wound has been in treatment 4 weeks. The wound is located on the Midline Coccyx. The wound measures 2.8cm length x 2.7cm width x 0.1cm depth; 5.938cm^2 area and 0.594cm^3 volume. There is Fat Layer (Subcutaneous Tissue) exposed. There  is a medium amount of serosanguineous drainage noted. There is medium (34-66%) red, pink granulation within the wound bed. There is a medium (34-66%) amount of necrotic tissue within the wound bed including Adherent Slough. Wound #8 status is Open. Original cause of wound was Pressure Injury. The date acquired was: 09/05/2021. The wound has been in treatment 58 weeks. The wound is located on the Left Gluteus. The wound measures 0.3cm length x 0.3cm width x 0.2cm depth; 0.071cm^2 area and 0.014cm^3 volume. There is Fat Layer (Subcutaneous Tissue) exposed. There is a medium amount of serosanguineous drainage noted. There is large (67-100%) pink granulation within the wound bed. There is a small (1-33%) amount of necrotic tissue within the wound bed. Assessment Active Problems ICD-10 Pressure ulcer of sacral region, stage 3 Pressure ulcer of other site, stage 3 Quadriplegia, C5-C7 incomplete Gastrostomy status Procedures Wound #11 Pre-procedure diagnosis of Wound #11 is a Pressure Ulcer located on the Midline Coccyx . There was a Excisional Skin/Subcutaneous Tissue Debridement with a total area of 5.93 sq cm performed by Allen Derry, PA-C. With the following instrument(s): Curette to remove Viable tissue/material. Material removed includes Subcutaneous Tissue, Slough, and Biofilm. No specimens were taken. A time out was conducted at 12:59, prior to the start of the procedure. A Large amount of bleeding was controlled with Silver Nitrate. The procedure was tolerated well with a pain level of 0 throughout and a pain level of 0 following the procedure. Post Debridement Measurements: 2.8cm length x 2.7cm width x 0.1cm depth; 0.594cm^3 volume. Post debridement Stage noted as Category/Stage III. Character of Wound/Ulcer Post Debridement is stable. Post procedure Diagnosis Wound #11: Same as Pre-Procedure Plan Follow-up Appointments: Return Appointment in 2 weeks. Nurse Visit as needed Home  Health: Home Health Company: - [facility] for wound care. May utilize formulary equivalent dressing for wound treatment orders unless otherwise specified. Home Health Nurse may visit PRN to address patients wound care needs. Scheduled days for dressing changes to be completed; exception, patient has scheduled wound care visit that day. **Please direct any NON-WOUND related issues/requests for orders to patient's Primary Care Physician. **If current dressing causes regression in wound condition, may D/C ordered dressing product/s and apply Normal Saline Moist Dressing daily until next Wound Healing Center or Other MD appointment. **Notify Wound Healing Center of regression in wound condition at [phone]. Bathing/ Shower/ Hygiene: Wash wounds with antibacterial soap and water. May shower; gently cleanse wound with antibacterial soap, rinse and pat dry prior to dressing wounds No tub bath. Off-Loading: Gel mattress overlay (Group 1) Dec, Braden [name] ([id number]) [id number] Page 11 of 12 Low air-loss mattress (Group 2) Air fluidized (Group 3) Turn and reposition every 2 hours Other: - relive pressure from heels on bed WOUND #11: - Coccyx Wound Laterality: Midline Cleanser: Soap and Water (Generic) 3 x Per Week/30 Days Discharge Instructions: Gently cleanse wound with antibacterial soap, rinse and pat dry prior to dressing wounds Prim Dressing: Hydrofera Blue Ready Transfer Foam, 2.5x2.5 (in/in) (Dispense As Written) 3 x Per Week/30 Days ary Discharge Instructions: Apply Hydrofera  Blue Ready to wound bed as directed Secondary Dressing: (BORDER) Zetuvit Plus SILICONE BORDER Dressing 5x5 (in/in) (Generic) 3 x Per Week/30 Days Discharge Instructions: Please do not put silicone bordered dressings under wraps. Use non-bordered dressing only. WOUND #8: - Gluteus Wound Laterality: Left Cleanser: Soap and Water (Generic) 3 x Per Week/30  Days Discharge Instructions: Gently cleanse wound with antibacterial soap, rinse and pat dry prior to dressing wounds Prim Dressing: Hydrofera Blue Ready Transfer Foam, 2.5x2.5 (in/in) (Dispense As Written) 3 x Per Week/30 Days ary Discharge Instructions: Apply Hydrofera Blue Ready to wound bed as directed Secondary Dressing: (BORDER) Zetuvit Plus SILICONE BORDER Dressing 5x5 (in/in) (Generic) 3 x Per Week/30 Days Discharge Instructions: Please do not put silicone bordered dressings under wraps. Use non-bordered dressing only. 1. I would recommend that the patient should continue to monitor for any signs of infection or worsening. Based on what I am seeing I am actually feeling like that the patient is doing extremely well with regard to the [facility]. 2. I am then recommend as well that we should continue to change this on a regular basis 3 times per week. We will see patient back for reevaluation in 1 week here in the clinic. If anything worsens or changes patient will contact our office for additional recommendations. Electronic Signature(s) Signed: 11/25/2022 6:22:03 PM By: Allen Derry PA-C Entered By: Allen Derry on 11/25/2022 15:22:03 -------------------------------------------------------------------------------- SuperBill Details Patient Name: Date of Service: [place] 11/25/2022 Medical Record Number: [id number] Patient Account Number: [id number] Date of Birth/Sex: Treating RN: 03-24-1966 (57 y.o. [name] Primary Care Provider: Marylynn Pearson Other Clinician: Referring Provider: Treating Provider/Extender: Cleotis Lema in Treatment: 58 Diagnosis Coding ICD-10 Codes Code Description [phone] Pressure ulcer of sacral region, stage 3 L89.893 Pressure ulcer of other site, stage 3 G82.54 Quadriplegia, C5-C7 incomplete Z93.1 Gastrostomy status Facility Procedures : CPT4 Code: [id number] Description: 11042 - DEB SUBQ TISSUE 20 SQ CM/<  ICD-10 Diagnosis Description L89.153 Pressure ulcer of sacral region, stage 3 Modifier: Quantity: 1 Physician Procedures : CPT4 Code Description Modifier [id number] 11042 - WC PHYS SUBQ TISS 20 SQ CM ICD-10 Diagnosis Description [name], [name] ([id number]) [id number] Page 12 of L89.153 Pressure ulcer of sacral region, stage 3 Quantity: 1 12 Electronic Signature(s) Signed: 11/25/2022 6:25:29 PM By: Allen Derry PA-C Entered By: Allen Derry on 11/25/2022 15:25:28

## 2022-11-28 NOTE — Progress Notes (Signed)
[name] ([id number]) [id number] Page 1 of 9 Visit Report for 11/25/2022 Arrival Information Details Patient Name: Date of Service: [name], [name] 11/25/2022 12:15 PM Medical Record Number: [id number] Patient Account Number: [id number] Date of Birth/Sex: Treating RN: 1965/10/03 (57 y.o. [name] Primary Care Nakesha Ebrahim: Marylynn Pearson Other Clinician: Referring Tiari Andringa: Treating Glorian Mcdonell/Extender: Cleotis Lema in Treatment: 69 Visit Information History Since Last Visit Added or deleted any medications: No Patient Arrived: Wheel Chair Any new allergies or adverse reactions: No Arrival Time: 12:25 Has Dressing in Place as Prescribed: Yes Accompanied By: self Pain Present Now: No Transfer Assistance: Hoyer Lift Patient Identification Verified: Yes Secondary Verification Process Completed: Yes Patient Requires Transmission-Based Precautions: No Patient Has Alerts: No Electronic Signature(s) Signed: 11/28/2022 1:24:46 PM By: Midge Aver MSN RN CNS WTA Entered By: Midge Aver on 11/25/2022 09:25:56 -------------------------------------------------------------------------------- Clinic Level of Care Assessment Details Patient Name: Date of Service: [name], Colin 11/25/2022 12:15 PM Medical Record Number: [id number] Patient Account Number: [id number] Date of Birth/Sex: Treating RN: 1965-08-31 (57 y.o. [name] Primary Care Emanuele Mcwhirter: Marylynn Pearson Other Clinician: Referring Keirstan Iannello: Treating Kennidee Heyne/Extender: Cleotis Lema in Treatment: 58 Clinic Level of Care Assessment Items TOOL 1 Quantity Score []  - 0 Use when EandM and Procedure is performed on INITIAL visit ASSESSMENTS - Nursing Assessment / Reassessment []  - 0 General Physical Exam (combine w/ comprehensive assessment (listed just below) when performed on new pt. evals) []  - 0 Comprehensive Assessment (HX, ROS, Risk  Assessments, Wounds Hx, etc.) ASSESSMENTS - Wound and Skin Assessment / Reassessment []  - 0 Dermatologic / Skin Assessment (not related to wound area) ASSESSMENTS - Ostomy and/or Continence Assessment and Care SHAWNMICHAEL, ANDERS [name] ([id number]) [id number] Page 2 of 9 []  - 0 Incontinence Assessment and Management []  - 0 Ostomy Care Assessment and Management (repouching, etc.) PROCESS - Coordination of Care []  - 0 Simple Patient / Family Education for ongoing care []  - 0 Complex (extensive) Patient / Family Education for ongoing care []  - 0 Staff obtains Chiropractor, Records, T Results / Process Orders est []  - 0 Staff telephones HHA, Nursing Homes / Clarify orders / etc []  - 0 Routine Transfer to another Facility (non-emergent condition) []  - 0 Routine Hospital Admission (non-emergent condition) []  - 0 New Admissions / Manufacturing engineer / Ordering NPWT Apligraf, etc. , []  - 0 Emergency Hospital Admission (emergent condition) PROCESS - Special Needs []  - 0 Pediatric / Minor Patient Management []  - 0 Isolation Patient Management []  - 0 Hearing / Language / Visual special needs []  - 0 Assessment of Community assistance (transportation, D/C planning, etc.) []  - 0 Additional assistance / Altered mentation []  - 0 Support Surface(s) Assessment (bed, cushion, seat, etc.) INTERVENTIONS - Miscellaneous []  - 0 External ear exam []  - 0 Patient Transfer (multiple staff / Nurse, adult / Similar devices) []  - 0 Simple Staple / Suture removal (25 or less) []  - 0 Complex Staple / Suture removal (26 or more) []  - 0 Hypo/Hyperglycemic Management (do not check if billed separately) []  - 0 Ankle / Brachial Index (ABI) - do not check if billed separately Has the patient been seen at the hospital within the last three years: Yes Total Score: 0 Level Of Care: ____ Electronic Signature(s) Signed: 11/28/2022 1:24:46 PM By: Midge Aver MSN RN CNS  WTA Entered By: Midge Aver on 11/25/2022 10:02:46 -------------------------------------------------------------------------------- Encounter Discharge Information Details Patient Name: Date of Service: [place] [name], [name].  11/25/2022 12:15 PM Medical Record Number: [id number] Patient Account Number: [id number] Date of Birth/Sex: Treating RN: 1965-06-16 (57 y.o. [name] Primary Care Adonay Scheier: Marylynn Pearson Other Clinician: Referring Enos Muhl: Treating Aureliano Oshields/Extender: Cleotis Lema in Treatment: 13 Encounter Discharge Information Items Post Procedure Vitals Discharge Condition: Stable Temperature (F): 97.5 [place], Klyde [name] ([id number]) [id number] Page 3 of 9 Ambulatory Status: Wheelchair Pulse (bpm): 106 Discharge Destination: Home Respiratory Rate (breaths/min): 18 Transportation: Other Blood Pressure (mmHg): 117/102 Accompanied By: self Schedule Follow-up Appointment: Yes Clinical Summary of Care: Electronic Signature(s) Signed: 11/28/2022 1:24:46 PM By: Midge Aver MSN RN CNS WTA Entered By: Midge Aver on 11/25/2022 10:05:01 -------------------------------------------------------------------------------- Lower Extremity Assessment Details Patient Name: Date of Service: [name], Colin 11/25/2022 12:15 PM Medical Record Number: [id number] Patient Account Number: [id number] Date of Birth/Sex: Treating RN: 04-Oct-1965 (57 y.o. [name] Primary Care Danni Shima: Marylynn Pearson Other Clinician: Referring Hector Taft: Treating Davyon Fisch/Extender: Cleotis Lema in Treatment: 20 Electronic Signature(s) Signed: 11/28/2022 1:24:46 PM By: Midge Aver MSN RN CNS WTA Entered By: Midge Aver on 11/25/2022 09:50:18 -------------------------------------------------------------------------------- Multi Wound Chart Details Patient Name: Date of Service: Fonnie [name], [name]. 11/25/2022 12:15  PM Medical Record Number: [id number] Patient Account Number: [id number] Date of Birth/Sex: Treating RN: [birth year] (57 y.o. [name] Primary Care Leith Hedlund: Marylynn Pearson Other Clinician: Referring Latrish Mogel: Treating Rama Sorci/Extender: Cleotis Lema in Treatment: 58 Vital Signs Height(in): 73 Pulse(bpm): 138 Weight(lbs): 154 Blood Pressure(mmHg): 117/102 Body Mass Index(BMI): 20.3 Temperature(F): 98.1 Respiratory Rate(breaths/min): 18 [11:Photos:] [[name]/A:[name]/A [id number] Page 4 of 9] Midline Coccyx Left Gluteus [name]/A Wound Location: Pressure Injury Pressure Injury [name]/A Wounding Event: Pressure Ulcer Pressure Ulcer [name]/A Primary Etiology: Hypotension, Myocardial Infarction, Hypotension, Myocardial Infarction, [name]/A Comorbid History: History of pressure wounds, History of pressure wounds, Rheumatoid Arthritis, Paraplegia, Rheumatoid Arthritis, Paraplegia, Confinement Anxiety Confinement Anxiety 10/21/2022 09/05/2021 [name]/A Date Acquired: 4 58 [name]/A Weeks of Treatment: Open Open [name]/A Wound Status: No No [name]/A Wound Recurrence: 2.8x2.7x0.1 0.3x0.3x0.2 [name]/A Measurements L x W x D (cm) 5.938 0.071 [name]/A A (cm) : rea 0.594 0.014 [name]/A Volume (cm) : 49.80% 98.70% [name]/A % Reduction in A rea: 49.70% 99.70% [name]/A % Reduction in Volume: Category/Stage III Category/Stage III [name]/A Classification: Medium Medium [name]/A Exudate A mount: Serosanguineous Serosanguineous [name]/A Exudate Type: red, brown red, brown [name]/A Exudate Color: Medium (34-66%) Large (67-100%) [name]/A Granulation A mount: Red, Pink Pink [name]/A Granulation Quality: Medium (34-66%) Small (1-33%) [name]/A Necrotic A mount: Fat Layer (Subcutaneous Tissue): Yes Fat Layer (Subcutaneous Tissue): Yes [name]/A Exposed Structures: Fascia: No Tendon: No Muscle: No Joint: No Bone: No [name]/A None [name]/A Epithelialization: Treatment Notes Electronic Signature(s) Signed: 11/28/2022 1:24:46 PM By: Midge Aver MSN RN CNS  WTA Entered By: Midge Aver on 11/25/2022 09:56:37 -------------------------------------------------------------------------------- Multi-Disciplinary Care Plan Details Patient Name: Date of Service: Fonnie [name], [name]. 11/25/2022 12:15 PM Medical Record Number: [id number] Patient Account Number: [id number] Date of Birth/Sex: Treating RN: [birth year] (57 y.o. [name] Primary Care Kalianna Verbeke: Marylynn Pearson Other Clinician: Referring Shaft Corigliano: Treating Joanann Mies/Extender: Cleotis Lema in Treatment: 65 Active Inactive Pressure Nursing Diagnoses: Knowledge deficit related to causes and risk factors for pressure ulcer development Knowledge deficit related to management of pressures ulcers Potential for impaired tissue integrity related to pressure, friction, moisture, and shear Goals: Patient will remain free from development of additional pressure ulcers Chavis, Markhi [name] ([id number]) [id number] Page 5 of 9 Date Initiated: 12/03/2021 Date Inactivated: 07/15/2022 Target Resolution Date: 12/03/2021 Goal  Status: Met Patient/caregiver will verbalize risk factors for pressure ulcer development Date Initiated: 12/03/2021 Date Inactivated: 08/26/2022 Target Resolution Date: 09/05/2022 Goal Status: Met Patient/caregiver will verbalize understanding of pressure ulcer management Date Initiated: 12/03/2021 Target Resolution Date: 01/17/2023 Goal Status: Active Interventions: Assess: immobility, friction, shearing, incontinence upon admission and as needed Assess offloading mechanisms upon admission and as needed Assess potential for pressure ulcer upon admission and as needed Provide education on pressure ulcers Notes: Electronic Signature(s) Signed: 11/28/2022 1:24:46 PM By: Midge Aver MSN RN CNS WTA Entered By: Midge Aver on 11/25/2022 10:03:30 -------------------------------------------------------------------------------- Pain  Assessment Details Patient Name: Date of Service: [address] Tinnie Gens [name]. 11/25/2022 12:15 PM Medical Record Number: [id number] Patient Account Number: [id number] Date of Birth/Sex: Treating RN: May 22, 1965 (57 y.o. [name] Primary Care Archibald Marchetta: Marylynn Pearson Other Clinician: Referring Shakyia Bosso: Treating Taelyn Nemes/Extender: Cleotis Lema in Treatment: 81 Active Problems Location of Pain Severity and Description of Pain Patient Has Paino No Site Locations Pain Management and Medication Current Pain Management: Electronic Signature(s) Signed: 11/28/2022 1:24:46 PM By: Midge Aver MSN RN CNS WTA Entered By: Midge Aver on 11/25/2022 09:45:12 Jac Canavan, Breion [name] ([id number]) [id number] Page 6 of 9 -------------------------------------------------------------------------------- Patient/Caregiver Education Details Patient Name: Date of Service: RAYLYNN, DELLA 8/20/2024andnbsp12:15 PM Medical Record Number: [id number] Patient Account Number: [id number] Date of Birth/Gender: Treating RN: 07-21-1965 (57 y.o. [name] Primary Care Physician: Marylynn Pearson Other Clinician: Referring Physician: Treating Physician/Extender: Cleotis Lema in Treatment: 52 Education Assessment Education Provided To: Patient Education Topics Provided Wound/Skin Impairment: Handouts: Caring for Your Ulcer Methods: Explain/Verbal Responses: State content correctly Electronic Signature(s) Signed: 11/28/2022 1:24:46 PM By: Midge Aver MSN RN CNS WTA Entered By: Midge Aver on 11/25/2022 10:03:49 -------------------------------------------------------------------------------- Wound Assessment Details Patient Name: Date of Service: Fayrene Fearing [name]. 11/25/2022 12:15 PM Medical Record Number: [id number] Patient Account Number: [id number] Date of Birth/Sex: Treating RN: 1966/03/13 (57 y.o. [name] Primary Care  Kaizer Dissinger: Marylynn Pearson Other Clinician: Referring Maury Groninger: Treating Nassir Neidert/Extender: Cleotis Lema in Treatment: 58 Wound Status Wound Number: 11 Primary Pressure Ulcer Etiology: Wound Location: Midline Coccyx Wound Open Wounding Event: Pressure Injury Status: Date Acquired: 10/21/2022 Comorbid Hypotension, Myocardial Infarction, History of pressure wounds, Weeks Of Treatment: 4 History: Rheumatoid Arthritis, Paraplegia, Confinement Anxiety Clustered Wound: No Photos ROXIE, ARB [name] ([id number]) [id number] Page 7 of 9 Wound Measurements Length: (cm) 2.8 Width: (cm) 2.7 Depth: (cm) 0.1 Area: (cm) 5.938 Volume: (cm) 0.594 % Reduction in Area: 49.8% % Reduction in Volume: 49.7% Wound Description Classification: Category/Stage III Exudate Amount: Medium Exudate Type: Serosanguineous Exudate Color: red, brown Foul Odor After Cleansing: No Slough/Fibrino Yes Wound Bed Granulation Amount: Medium (34-66%) Exposed Structure Granulation Quality: Red, Pink Fascia Exposed: No Necrotic Amount: Medium (34-66%) Fat Layer (Subcutaneous Tissue) Exposed: Yes Necrotic Quality: Adherent Slough Tendon Exposed: No Muscle Exposed: No Joint Exposed: No Bone Exposed: No Treatment Notes Wound #11 (Coccyx) Wound Laterality: Midline Cleanser Soap and Water Discharge Instruction: Gently cleanse wound with antibacterial soap, rinse and pat dry prior to dressing wounds Peri-Wound Care Topical Primary Dressing Hydrofera Blue Ready Transfer Foam, 2.5x2.5 (in/in) Discharge Instruction: Apply Hydrofera Blue Ready to wound bed as directed Secondary Dressing (BORDER) Zetuvit Plus SILICONE BORDER Dressing 5x5 (in/in) Discharge Instruction: Please do not put silicone bordered dressings under wraps. Use non-bordered dressing only. Secured With Compression Wrap Compression Stockings Facilities manager) Signed: 11/28/2022 1:24:46 PM  By: Midge Aver MSN RN CNS WTA Entered By:  Midge Aver on 11/25/2022 09:49:32 ARIYAN, SOTELLO [name] ([id number]) [id number] Page 8 of 9 -------------------------------------------------------------------------------- Wound Assessment Details Patient Name: Date of Service: EJ, KITTNER 11/25/2022 12:15 PM Medical Record Number: [id number] Patient Account Number: [id number] Date of Birth/Sex: Treating RN: 1965/04/30 (57 y.o. [name] Primary Care Cipriana Biller: Marylynn Pearson Other Clinician: Referring Brek Reece: Treating Lilas Diefendorf/Extender: Cleotis Lema in Treatment: 58 Wound Status Wound Number: 8 Primary Pressure Ulcer Etiology: Wound Location: Left Gluteus Wound Open Wounding Event: Pressure Injury Status: Date Acquired: 09/05/2021 Comorbid Hypotension, Myocardial Infarction, History of pressure wounds, Weeks Of Treatment: 58 History: Rheumatoid Arthritis, Paraplegia, Confinement Anxiety Clustered Wound: No Photos Wound Measurements Length: (cm) 0.3 Width: (cm) 0.3 Depth: (cm) 0.2 Area: (cm) 0.071 Volume: (cm) 0.014 % Reduction in Area: 98.7% % Reduction in Volume: 99.7% Epithelialization: None Wound Description Classification: Category/Stage III Exudate Amount: Medium Exudate Type: Serosanguineous Exudate Color: red, brown Foul Odor After Cleansing: No Slough/Fibrino No Wound Bed Granulation Amount: Large (67-100%) Exposed Structure Granulation Quality: Pink Fat Layer (Subcutaneous Tissue) Exposed: Yes Necrotic Amount: Small (1-33%) Treatment Notes Wound #8 (Gluteus) Wound Laterality: Left Cleanser Soap and Water Discharge Instruction: Gently cleanse wound with antibacterial soap, rinse and pat dry prior to dressing wounds Peri-Wound Care Topical Primary Dressing [place], WALD [name] ([id number]) [id number] Page 9 of 9 Hydrofera Blue Ready Transfer Foam, 2.5x2.5  (in/in) Discharge Instruction: Apply Hydrofera Blue Ready to wound bed as directed Secondary Dressing (BORDER) Zetuvit Plus SILICONE BORDER Dressing 5x5 (in/in) Discharge Instruction: Please do not put silicone bordered dressings under wraps. Use non-bordered dressing only. Secured With Compression Wrap Compression Stockings Facilities manager) Signed: 11/28/2022 1:24:46 PM By: Midge Aver MSN RN CNS WTA Entered By: Midge Aver on 11/25/2022 09:49:51 -------------------------------------------------------------------------------- Vitals Details Patient Name: Date of Service: Fonnie [name], [name]. 11/25/2022 12:15 PM Medical Record Number: [id number] Patient Account Number: [id number] Date of Birth/Sex: Treating RN: 1965/04/13 (57 y.o. [name] Primary Care Divine Imber: Marylynn Pearson Other Clinician: Referring Kishan Wachsmuth: Treating Murrell Dome/Extender: Cleotis Lema in Treatment: 58 Vital Signs Time Taken: 12:44 Temperature (F): 98.1 Height (in): 73 Pulse (bpm): 138 Weight (lbs): 154 Respiratory Rate (breaths/min): 18 Body Mass Index (BMI): 20.3 Blood Pressure (mmHg): 117/102 Reference Range: 80 - 120 mg / dl Electronic Signature(s) Signed: 11/28/2022 1:24:46 PM By: Midge Aver MSN RN CNS WTA Entered By: Midge Aver on 11/25/2022 09:44:58

## 2022-12-23 ENCOUNTER — Ambulatory Visit: Payer: Medicare Other | Admitting: Physician Assistant

## 2022-12-30 ENCOUNTER — Ambulatory Visit: Payer: Medicare Other | Admitting: Physician Assistant

## 2023-01-06 ENCOUNTER — Ambulatory Visit: Payer: Medicare Other | Admitting: Physician Assistant

## 2023-01-13 ENCOUNTER — Ambulatory Visit: Payer: Medicare Other | Admitting: Physician Assistant

## 2023-02-03 ENCOUNTER — Encounter: Payer: Medicare Other | Attending: Physician Assistant | Admitting: Physician Assistant

## 2023-02-03 DIAGNOSIS — L89893 Pressure ulcer of other site, stage 3: Secondary | ICD-10-CM | POA: Diagnosis not present

## 2023-02-03 DIAGNOSIS — G8254 Quadriplegia, C5-C7 incomplete: Secondary | ICD-10-CM | POA: Insufficient documentation

## 2023-02-03 DIAGNOSIS — Z6820 Body mass index (BMI) 20.0-20.9, adult: Secondary | ICD-10-CM | POA: Insufficient documentation

## 2023-02-03 DIAGNOSIS — L89153 Pressure ulcer of sacral region, stage 3: Secondary | ICD-10-CM | POA: Insufficient documentation

## 2023-02-03 DIAGNOSIS — I252 Old myocardial infarction: Secondary | ICD-10-CM | POA: Diagnosis not present

## 2023-02-03 DIAGNOSIS — E46 Unspecified protein-calorie malnutrition: Secondary | ICD-10-CM | POA: Insufficient documentation

## 2023-02-03 DIAGNOSIS — Z931 Gastrostomy status: Secondary | ICD-10-CM | POA: Diagnosis not present

## 2023-02-03 DIAGNOSIS — Z8619 Personal history of other infectious and parasitic diseases: Secondary | ICD-10-CM | POA: Diagnosis not present

## 2023-02-03 DIAGNOSIS — M069 Rheumatoid arthritis, unspecified: Secondary | ICD-10-CM | POA: Insufficient documentation

## 2023-02-03 NOTE — Progress Notes (Signed)
Colin, SHERRARD [name] ([id number]) [id number] Page 1 of 9 Visit Report for 02/03/2023 Chief Complaint Document Details Patient Name: Date of Service: [name], [name] 02/03/2023 12:15 PM Medical Record Number: [id number] Patient Account Number: [id number] Date of Birth/Sex: Treating RN: [birth year] (57 y.o. [name] Primary Care Provider: Marylynn Pearson Other Clinician: Referring Provider: Treating Provider/Extender: Lorna Dibble Weeks in Treatment: 63 Information Obtained from: Patient Chief Complaint Sacral and back pressure ulcers Electronic Signature(s) Signed: 02/03/2023 12:33:36 PM By: Allen Derry PA-C Entered By: Allen Derry on 02/03/2023 12:33:36 -------------------------------------------------------------------------------- HPI Details Patient Name: Date of Service: [name], [place] [name]. 02/03/2023 12:15 PM Medical Record Number: [id number] Patient Account Number: [id number] Date of Birth/Sex: Treating RN: Sep 09, 1965 (57 y.o. [name] Primary Care Provider: Marylynn Pearson Other Clinician: Referring Provider: Treating Provider/Extender: Lorna Dibble Weeks in Treatment: 56 History of Present Illness ssociated Signs and Symptoms: Patient has a history of confirmed osteomyelitis of the left foot according to MRI July 2019. 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 HPI Description: 12/08/17 on evaluation today patient presents for initial evaluation and our clinic concerning issues he has been having with his feet bilaterally although the left more than right most recently. He was admitted to hospital where he was placed on IV vancomycin which actually he continue to utilize until discharge and even when discharged continue to be on until around 16 August according to what he tells me. His PICC line was removed at that  point. Nonetheless he has been seen in the wound care center in [facility] before transferring to [place] due to the fact that the doctor there left and they are no longer open. Nonetheless he does have significant osteomyelitis of the left foot noted on MRI which revealed significant issues with necrotic bone including a portion of the distal region of his left great toe which was felt to possibly either be missing as a result of amputation or potentially resorption. Either way he also had osteomyelitis noted of the digit, metatarsal region, cuneiform, and navicular bones. There is definite bone exposure noted externally at this point in time. In regard to the right foot he has issues with ulcerations here as well although he states that recently he's had no issues until this just blistered and reopened. Apparently Xeroform was used in this has caused things to be somewhat more moist and open more according to the patient. He's otherwise been tolerating the dressing changes without complication using silver alginate dressings. He has no discomfort but does seem to be extremely stressed regarding the fact that he did see Dr. 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 The patient stated per notes reviewed that he did not want to have any surgery and therefore has a repeat appointment scheduled with the surgeon on 12/15/17. With all that being said he feels like that he really has not been alerted to what was going on in the severity that was until just recently and has a lot of questions in that regard. I'll be see I explained that seeing him for the first time today I cannot answer a lot of those questions that he has. [name], TILLAR [name] ([id number]) [id number] Page 2 of 9 12/21/17 on evaluation today patient actually appears to be doing a little better in regard to the right foot in particular. There is one area where there  still  There is a medium (34-66%) amount of necrotic tissue within the wound bed. Wound #8 status is Healed - Epithelialized. Original cause of wound was Pressure Injury. The date acquired was: 09/05/2021. The wound has been in treatment 68 weeks. The wound is located on the Left Gluteus. The wound measures 0cm length x 0cm width x 0cm depth; 0cm^2 area and 0cm^3 volume. There is Fat Layer (Subcutaneous Tissue) exposed. There is a medium amount of serosanguineous drainage noted. There is large (67-100%) pink granulation within the wound bed. There is a small (1-33%) amount of necrotic tissue within the wound bed. Assessment Active Problems ICD-10 Pressure ulcer of sacral region, stage 3 Pressure ulcer of other site, stage 3 Quadriplegia, C5-C7 incomplete Gastrostomy status Colin, Andreus [name] ([id number]) [id number] Page 8 of 9 Plan Discharge From [facility] Services: Discharge from Wound Care Center Treatment Complete - May use border foam on boney prominence for protection Follow-up Appointments: Other: - call if needed 1. Based on what I am seeing I do believe that the patient is completely healed which is great news. 2. I am going to recommend that the patient should continue with the bordered foam dressing  over the bony prominences to prevent this from breaking down. 3. I would recommend that we continue with appropriate offloading. We will see the patient back for reevaluation as needed. Electronic Signature(s) Signed: 02/03/2023 1:16:31 PM By: Allen Derry PA-C Previous Signature: 02/03/2023 1:16:13 PM Version By: Allen Derry PA-C Entered By: Allen Derry on 02/03/2023 13:16:31 -------------------------------------------------------------------------------- SuperBill Details Patient Name: Date of Service: [name], [place] [name]. 02/03/2023 Medical Record Number: [id number] Patient Account Number: [id number] Date of Birth/Sex: Treating RN: [birth year] (57 y.o. [name] Primary Care Provider: Marylynn Pearson Other Clinician: Referring Provider: Treating Provider/Extender: Lorna Dibble Weeks in Treatment: 68 Diagnosis Coding ICD-10 Codes Code Description L89.153 Pressure ulcer of sacral region, stage 3 L89.893 Pressure ulcer of other site, stage 3 G82.54 Quadriplegia, C5-C7 incomplete Z93.1 Gastrostomy status Facility Procedures : CPT4 Code: 40347425 Description: 99213 - WOUND CARE VISIT-LEV 3 EST PT Modifier: Quantity: 1 Physician Procedures : CPT4 Code Description Modifier [id number] 99213 - WC PHYS LEVEL 3 - EST PT ICD-10 Diagnosis Description L89.153 Pressure ulcer of sacral region, stage 3 L89.893 Pressure ulcer of other site, stage 3 G82.54 Quadriplegia, C5-C7 incomplete Z93.1 Gastrostomy  status Quantity: 1 Electronic Signature(s) Signed: 02/03/2023 5:01:56 PM By: Midge Aver MSN RN CNS WTA Signed: 02/03/2023 6:10:41 PM By: Allen Derry PA-C Previous Signature: 02/03/2023 1:16:47 PM Version By: Katharina Caper, Tolbert [name] ([id number]) [id number] Page 9 of 9 Entered By: Midge Aver on 02/03/2023 17:01:56  There is a medium (34-66%) amount of necrotic tissue within the wound bed. Wound #8 status is Healed - Epithelialized. Original cause of wound was Pressure Injury. The date acquired was: 09/05/2021. The wound has been in treatment 68 weeks. The wound is located on the Left Gluteus. The wound measures 0cm length x 0cm width x 0cm depth; 0cm^2 area and 0cm^3 volume. There is Fat Layer (Subcutaneous Tissue) exposed. There is a medium amount of serosanguineous drainage noted. There is large (67-100%) pink granulation within the wound bed. There is a small (1-33%) amount of necrotic tissue within the wound bed. Assessment Active Problems ICD-10 Pressure ulcer of sacral region, stage 3 Pressure ulcer of other site, stage 3 Quadriplegia, C5-C7 incomplete Gastrostomy status Colin, Andreus [name] ([id number]) [id number] Page 8 of 9 Plan Discharge From [facility] Services: Discharge from Wound Care Center Treatment Complete - May use border foam on boney prominence for protection Follow-up Appointments: Other: - call if needed 1. Based on what I am seeing I do believe that the patient is completely healed which is great news. 2. I am going to recommend that the patient should continue with the bordered foam dressing  over the bony prominences to prevent this from breaking down. 3. I would recommend that we continue with appropriate offloading. We will see the patient back for reevaluation as needed. Electronic Signature(s) Signed: 02/03/2023 1:16:31 PM By: Allen Derry PA-C Previous Signature: 02/03/2023 1:16:13 PM Version By: Allen Derry PA-C Entered By: Allen Derry on 02/03/2023 13:16:31 -------------------------------------------------------------------------------- SuperBill Details Patient Name: Date of Service: [name], [place] [name]. 02/03/2023 Medical Record Number: [id number] Patient Account Number: [id number] Date of Birth/Sex: Treating RN: [birth year] (57 y.o. [name] Primary Care Provider: Marylynn Pearson Other Clinician: Referring Provider: Treating Provider/Extender: Lorna Dibble Weeks in Treatment: 68 Diagnosis Coding ICD-10 Codes Code Description L89.153 Pressure ulcer of sacral region, stage 3 L89.893 Pressure ulcer of other site, stage 3 G82.54 Quadriplegia, C5-C7 incomplete Z93.1 Gastrostomy status Facility Procedures : CPT4 Code: 40347425 Description: 99213 - WOUND CARE VISIT-LEV 3 EST PT Modifier: Quantity: 1 Physician Procedures : CPT4 Code Description Modifier [id number] 99213 - WC PHYS LEVEL 3 - EST PT ICD-10 Diagnosis Description L89.153 Pressure ulcer of sacral region, stage 3 L89.893 Pressure ulcer of other site, stage 3 G82.54 Quadriplegia, C5-C7 incomplete Z93.1 Gastrostomy  status Quantity: 1 Electronic Signature(s) Signed: 02/03/2023 5:01:56 PM By: Midge Aver MSN RN CNS WTA Signed: 02/03/2023 6:10:41 PM By: Allen Derry PA-C Previous Signature: 02/03/2023 1:16:47 PM Version By: Katharina Caper, Tolbert [name] ([id number]) [id number] Page 9 of 9 Entered By: Midge Aver on 02/03/2023 17:01:56  There is a medium (34-66%) amount of necrotic tissue within the wound bed. Wound #8 status is Healed - Epithelialized. Original cause of wound was Pressure Injury. The date acquired was: 09/05/2021. The wound has been in treatment 68 weeks. The wound is located on the Left Gluteus. The wound measures 0cm length x 0cm width x 0cm depth; 0cm^2 area and 0cm^3 volume. There is Fat Layer (Subcutaneous Tissue) exposed. There is a medium amount of serosanguineous drainage noted. There is large (67-100%) pink granulation within the wound bed. There is a small (1-33%) amount of necrotic tissue within the wound bed. Assessment Active Problems ICD-10 Pressure ulcer of sacral region, stage 3 Pressure ulcer of other site, stage 3 Quadriplegia, C5-C7 incomplete Gastrostomy status Colin, Andreus [name] ([id number]) [id number] Page 8 of 9 Plan Discharge From [facility] Services: Discharge from Wound Care Center Treatment Complete - May use border foam on boney prominence for protection Follow-up Appointments: Other: - call if needed 1. Based on what I am seeing I do believe that the patient is completely healed which is great news. 2. I am going to recommend that the patient should continue with the bordered foam dressing  over the bony prominences to prevent this from breaking down. 3. I would recommend that we continue with appropriate offloading. We will see the patient back for reevaluation as needed. Electronic Signature(s) Signed: 02/03/2023 1:16:31 PM By: Allen Derry PA-C Previous Signature: 02/03/2023 1:16:13 PM Version By: Allen Derry PA-C Entered By: Allen Derry on 02/03/2023 13:16:31 -------------------------------------------------------------------------------- SuperBill Details Patient Name: Date of Service: [name], [place] [name]. 02/03/2023 Medical Record Number: [id number] Patient Account Number: [id number] Date of Birth/Sex: Treating RN: [birth year] (57 y.o. [name] Primary Care Provider: Marylynn Pearson Other Clinician: Referring Provider: Treating Provider/Extender: Lorna Dibble Weeks in Treatment: 68 Diagnosis Coding ICD-10 Codes Code Description L89.153 Pressure ulcer of sacral region, stage 3 L89.893 Pressure ulcer of other site, stage 3 G82.54 Quadriplegia, C5-C7 incomplete Z93.1 Gastrostomy status Facility Procedures : CPT4 Code: 40347425 Description: 99213 - WOUND CARE VISIT-LEV 3 EST PT Modifier: Quantity: 1 Physician Procedures : CPT4 Code Description Modifier [id number] 99213 - WC PHYS LEVEL 3 - EST PT ICD-10 Diagnosis Description L89.153 Pressure ulcer of sacral region, stage 3 L89.893 Pressure ulcer of other site, stage 3 G82.54 Quadriplegia, C5-C7 incomplete Z93.1 Gastrostomy  status Quantity: 1 Electronic Signature(s) Signed: 02/03/2023 5:01:56 PM By: Midge Aver MSN RN CNS WTA Signed: 02/03/2023 6:10:41 PM By: Allen Derry PA-C Previous Signature: 02/03/2023 1:16:47 PM Version By: Katharina Caper, Tolbert [name] ([id number]) [id number] Page 9 of 9 Entered By: Midge Aver on 02/03/2023 17:01:56  Physical Exam Details Patient Name: Date of Service: JAKE, IRIGOYEN 02/03/2023 12:15 PM Medical Record Number: [id number] Patient Account Number: [id number] Date of Birth/Sex: Treating RN: [birth year] (57 y.o. [name] Primary Care Provider: Marylynn Pearson Other Clinician: Referring Provider: Treating Provider/Extender: Lorna Dibble Weeks in Treatment: 68 Constitutional Well-nourished and well-hydrated in no acute distress. Respiratory normal breathing without difficulty. Psychiatric this patient is able to make decisions and demonstrates good insight into disease process. Alert and Oriented x 3. pleasant and cooperative. Notes Upon inspection patient's wound bed showed signs of being completely healed at both locations and is doing excellent. I am actually very pleased with where we stand and I am happy to see that he is doing so well. Especially after we had not seen him in such a long time. Is actually been about 2 months since he was last here due to chair and transportation issues. Electronic Signature(s) Signed: 02/03/2023 1:12:37 PM By: Katharina Caper, Argel [name] ([id number]) [id number] Page 4 of 9 Entered By: Allen Derry on 02/03/2023 13:12:37 -------------------------------------------------------------------------------- Physician Orders Details Patient Name: Date of Service: ZYRION, NIKOLIC 02/03/2023 12:15 PM Medical Record Number: [id number] Patient Account Number: [id number] Date of Birth/Sex: Treating RN: Feb 01, 1966 (57 y.o. [name] Primary Care Provider: Marylynn Pearson Other Clinician: Referring Provider: Treating Provider/Extender: Lorna Dibble Weeks in Treatment: 57 The following information was scribed by: Midge Aver The information was scribed for: Allen Derry Verbal / Phone Orders: No Diagnosis Coding ICD-10 Coding Code Description L89.153 Pressure ulcer of sacral region, stage 3 L89.893 Pressure ulcer of other site, stage 3 G82.54 Quadriplegia, C5-C7 incomplete Z93.1 Gastrostomy status Discharge From [facility] Services Discharge from Wound Care Center Treatment Complete - May use border foam on boney prominence for protection Follow-up Appointments Other: - call if needed Electronic Signature(s) Signed: 02/03/2023 5:00:58 PM By: Midge Aver MSN RN CNS WTA Signed: 02/03/2023 6:10:41 PM By: Allen Derry PA-C Entered By: Midge Aver on 02/03/2023 17:00:58 -------------------------------------------------------------------------------- Problem List Details Patient Name: Date of Service: [name], Sharron [name]. 02/03/2023 12:15 PM Medical Record Number: [id number] Patient Account Number: [id number] Date of Birth/Sex: Treating RN: [birth year] (57 y.o. [name] Primary Care Provider: Marylynn Pearson Other Clinician: Referring Provider: Treating Provider/Extender: Lorna Dibble Weeks in Treatment: [address] Active Problems ICD-10 JAKOBI, DARRISAW [name] ([id number]) [id number] Page 5 of 9 Encounter Code Description Active Date MDM Diagnosis L89.153 Pressure ulcer of sacral region, stage 3 10/15/2021 No Yes L89.893 Pressure ulcer of other site, stage 3 10/15/2021 No Yes G82.54 Quadriplegia, C5-C7 incomplete 10/15/2021 No Yes Z93.1 Gastrostomy status 10/15/2021 No Yes Inactive Problems Resolved Problems Electronic Signature(s) Signed: 02/03/2023 12:33:26 PM By: Allen Derry PA-C Entered By: Allen Derry on 02/03/2023 12:33:26 -------------------------------------------------------------------------------- Progress Note Details Patient Name: Date of  Service: [address], [place] [name]. 02/03/2023 12:15 PM Medical Record Number: [id number] Patient Account Number: [id number] Date of Birth/Sex: Treating RN: 1965-10-29 (57 y.o. [name] Primary Care Provider: Marylynn Pearson Other Clinician: Referring Provider: Treating Provider/Extender: Lorna Dibble Weeks in Treatment: 59 Subjective Chief Complaint Information obtained from Patient Sacral and back pressure ulcers History of Present Illness (HPI) The following HPI elements were documented for the patient's wound: Associated Signs and Symptoms: Patient has a history of confirmed osteomyelitis of the left foot according to MRI July 2019. 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 12/08/17 on evaluation today  Colin, SHERRARD [name] ([id number]) [id number] Page 1 of 9 Visit Report for 02/03/2023 Chief Complaint Document Details Patient Name: Date of Service: [name], [name] 02/03/2023 12:15 PM Medical Record Number: [id number] Patient Account Number: [id number] Date of Birth/Sex: Treating RN: [birth year] (57 y.o. [name] Primary Care Provider: Marylynn Pearson Other Clinician: Referring Provider: Treating Provider/Extender: Lorna Dibble Weeks in Treatment: 63 Information Obtained from: Patient Chief Complaint Sacral and back pressure ulcers Electronic Signature(s) Signed: 02/03/2023 12:33:36 PM By: Allen Derry PA-C Entered By: Allen Derry on 02/03/2023 12:33:36 -------------------------------------------------------------------------------- HPI Details Patient Name: Date of Service: [name], [place] [name]. 02/03/2023 12:15 PM Medical Record Number: [id number] Patient Account Number: [id number] Date of Birth/Sex: Treating RN: Sep 09, 1965 (57 y.o. [name] Primary Care Provider: Marylynn Pearson Other Clinician: Referring Provider: Treating Provider/Extender: Lorna Dibble Weeks in Treatment: 56 History of Present Illness ssociated Signs and Symptoms: Patient has a history of confirmed osteomyelitis of the left foot according to MRI July 2019. 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 HPI Description: 12/08/17 on evaluation today patient presents for initial evaluation and our clinic concerning issues he has been having with his feet bilaterally although the left more than right most recently. He was admitted to hospital where he was placed on IV vancomycin which actually he continue to utilize until discharge and even when discharged continue to be on until around 16 August according to what he tells me. His PICC line was removed at that  point. Nonetheless he has been seen in the wound care center in [facility] before transferring to [place] due to the fact that the doctor there left and they are no longer open. Nonetheless he does have significant osteomyelitis of the left foot noted on MRI which revealed significant issues with necrotic bone including a portion of the distal region of his left great toe which was felt to possibly either be missing as a result of amputation or potentially resorption. Either way he also had osteomyelitis noted of the digit, metatarsal region, cuneiform, and navicular bones. There is definite bone exposure noted externally at this point in time. In regard to the right foot he has issues with ulcerations here as well although he states that recently he's had no issues until this just blistered and reopened. Apparently Xeroform was used in this has caused things to be somewhat more moist and open more according to the patient. He's otherwise been tolerating the dressing changes without complication using silver alginate dressings. He has no discomfort but does seem to be extremely stressed regarding the fact that he did see Dr. 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 The patient stated per notes reviewed that he did not want to have any surgery and therefore has a repeat appointment scheduled with the surgeon on 12/15/17. With all that being said he feels like that he really has not been alerted to what was going on in the severity that was until just recently and has a lot of questions in that regard. I'll be see I explained that seeing him for the first time today I cannot answer a lot of those questions that he has. [name], TILLAR [name] ([id number]) [id number] Page 2 of 9 12/21/17 on evaluation today patient actually appears to be doing a little better in regard to the right foot in particular. There is one area where there  still  Colin, SHERRARD [name] ([id number]) [id number] Page 1 of 9 Visit Report for 02/03/2023 Chief Complaint Document Details Patient Name: Date of Service: [name], [name] 02/03/2023 12:15 PM Medical Record Number: [id number] Patient Account Number: [id number] Date of Birth/Sex: Treating RN: [birth year] (57 y.o. [name] Primary Care Provider: Marylynn Pearson Other Clinician: Referring Provider: Treating Provider/Extender: Lorna Dibble Weeks in Treatment: 63 Information Obtained from: Patient Chief Complaint Sacral and back pressure ulcers Electronic Signature(s) Signed: 02/03/2023 12:33:36 PM By: Allen Derry PA-C Entered By: Allen Derry on 02/03/2023 12:33:36 -------------------------------------------------------------------------------- HPI Details Patient Name: Date of Service: [name], [place] [name]. 02/03/2023 12:15 PM Medical Record Number: [id number] Patient Account Number: [id number] Date of Birth/Sex: Treating RN: Sep 09, 1965 (57 y.o. [name] Primary Care Provider: Marylynn Pearson Other Clinician: Referring Provider: Treating Provider/Extender: Lorna Dibble Weeks in Treatment: 56 History of Present Illness ssociated Signs and Symptoms: Patient has a history of confirmed osteomyelitis of the left foot according to MRI July 2019. 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 HPI Description: 12/08/17 on evaluation today patient presents for initial evaluation and our clinic concerning issues he has been having with his feet bilaterally although the left more than right most recently. He was admitted to hospital where he was placed on IV vancomycin which actually he continue to utilize until discharge and even when discharged continue to be on until around 16 August according to what he tells me. His PICC line was removed at that  point. Nonetheless he has been seen in the wound care center in [facility] before transferring to [place] due to the fact that the doctor there left and they are no longer open. Nonetheless he does have significant osteomyelitis of the left foot noted on MRI which revealed significant issues with necrotic bone including a portion of the distal region of his left great toe which was felt to possibly either be missing as a result of amputation or potentially resorption. Either way he also had osteomyelitis noted of the digit, metatarsal region, cuneiform, and navicular bones. There is definite bone exposure noted externally at this point in time. In regard to the right foot he has issues with ulcerations here as well although he states that recently he's had no issues until this just blistered and reopened. Apparently Xeroform was used in this has caused things to be somewhat more moist and open more according to the patient. He's otherwise been tolerating the dressing changes without complication using silver alginate dressings. He has no discomfort but does seem to be extremely stressed regarding the fact that he did see Dr. 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 The patient stated per notes reviewed that he did not want to have any surgery and therefore has a repeat appointment scheduled with the surgeon on 12/15/17. With all that being said he feels like that he really has not been alerted to what was going on in the severity that was until just recently and has a lot of questions in that regard. I'll be see I explained that seeing him for the first time today I cannot answer a lot of those questions that he has. [name], TILLAR [name] ([id number]) [id number] Page 2 of 9 12/21/17 on evaluation today patient actually appears to be doing a little better in regard to the right foot in particular. There is one area where there  still  Colin, SHERRARD [name] ([id number]) [id number] Page 1 of 9 Visit Report for 02/03/2023 Chief Complaint Document Details Patient Name: Date of Service: [name], [name] 02/03/2023 12:15 PM Medical Record Number: [id number] Patient Account Number: [id number] Date of Birth/Sex: Treating RN: [birth year] (57 y.o. [name] Primary Care Provider: Marylynn Pearson Other Clinician: Referring Provider: Treating Provider/Extender: Lorna Dibble Weeks in Treatment: 63 Information Obtained from: Patient Chief Complaint Sacral and back pressure ulcers Electronic Signature(s) Signed: 02/03/2023 12:33:36 PM By: Allen Derry PA-C Entered By: Allen Derry on 02/03/2023 12:33:36 -------------------------------------------------------------------------------- HPI Details Patient Name: Date of Service: [name], [place] [name]. 02/03/2023 12:15 PM Medical Record Number: [id number] Patient Account Number: [id number] Date of Birth/Sex: Treating RN: Sep 09, 1965 (57 y.o. [name] Primary Care Provider: Marylynn Pearson Other Clinician: Referring Provider: Treating Provider/Extender: Lorna Dibble Weeks in Treatment: 56 History of Present Illness ssociated Signs and Symptoms: Patient has a history of confirmed osteomyelitis of the left foot according to MRI July 2019. 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 HPI Description: 12/08/17 on evaluation today patient presents for initial evaluation and our clinic concerning issues he has been having with his feet bilaterally although the left more than right most recently. He was admitted to hospital where he was placed on IV vancomycin which actually he continue to utilize until discharge and even when discharged continue to be on until around 16 August according to what he tells me. His PICC line was removed at that  point. Nonetheless he has been seen in the wound care center in [facility] before transferring to [place] due to the fact that the doctor there left and they are no longer open. Nonetheless he does have significant osteomyelitis of the left foot noted on MRI which revealed significant issues with necrotic bone including a portion of the distal region of his left great toe which was felt to possibly either be missing as a result of amputation or potentially resorption. Either way he also had osteomyelitis noted of the digit, metatarsal region, cuneiform, and navicular bones. There is definite bone exposure noted externally at this point in time. In regard to the right foot he has issues with ulcerations here as well although he states that recently he's had no issues until this just blistered and reopened. Apparently Xeroform was used in this has caused things to be somewhat more moist and open more according to the patient. He's otherwise been tolerating the dressing changes without complication using silver alginate dressings. He has no discomfort but does seem to be extremely stressed regarding the fact that he did see Dr. 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 The patient stated per notes reviewed that he did not want to have any surgery and therefore has a repeat appointment scheduled with the surgeon on 12/15/17. With all that being said he feels like that he really has not been alerted to what was going on in the severity that was until just recently and has a lot of questions in that regard. I'll be see I explained that seeing him for the first time today I cannot answer a lot of those questions that he has. [name], TILLAR [name] ([id number]) [id number] Page 2 of 9 12/21/17 on evaluation today patient actually appears to be doing a little better in regard to the right foot in particular. There is one area where there  still  There is a medium (34-66%) amount of necrotic tissue within the wound bed. Wound #8 status is Healed - Epithelialized. Original cause of wound was Pressure Injury. The date acquired was: 09/05/2021. The wound has been in treatment 68 weeks. The wound is located on the Left Gluteus. The wound measures 0cm length x 0cm width x 0cm depth; 0cm^2 area and 0cm^3 volume. There is Fat Layer (Subcutaneous Tissue) exposed. There is a medium amount of serosanguineous drainage noted. There is large (67-100%) pink granulation within the wound bed. There is a small (1-33%) amount of necrotic tissue within the wound bed. Assessment Active Problems ICD-10 Pressure ulcer of sacral region, stage 3 Pressure ulcer of other site, stage 3 Quadriplegia, C5-C7 incomplete Gastrostomy status Colin, Andreus [name] ([id number]) [id number] Page 8 of 9 Plan Discharge From [facility] Services: Discharge from Wound Care Center Treatment Complete - May use border foam on boney prominence for protection Follow-up Appointments: Other: - call if needed 1. Based on what I am seeing I do believe that the patient is completely healed which is great news. 2. I am going to recommend that the patient should continue with the bordered foam dressing  over the bony prominences to prevent this from breaking down. 3. I would recommend that we continue with appropriate offloading. We will see the patient back for reevaluation as needed. Electronic Signature(s) Signed: 02/03/2023 1:16:31 PM By: Allen Derry PA-C Previous Signature: 02/03/2023 1:16:13 PM Version By: Allen Derry PA-C Entered By: Allen Derry on 02/03/2023 13:16:31 -------------------------------------------------------------------------------- SuperBill Details Patient Name: Date of Service: [name], [place] [name]. 02/03/2023 Medical Record Number: [id number] Patient Account Number: [id number] Date of Birth/Sex: Treating RN: [birth year] (57 y.o. [name] Primary Care Provider: Marylynn Pearson Other Clinician: Referring Provider: Treating Provider/Extender: Lorna Dibble Weeks in Treatment: 68 Diagnosis Coding ICD-10 Codes Code Description L89.153 Pressure ulcer of sacral region, stage 3 L89.893 Pressure ulcer of other site, stage 3 G82.54 Quadriplegia, C5-C7 incomplete Z93.1 Gastrostomy status Facility Procedures : CPT4 Code: 40347425 Description: 99213 - WOUND CARE VISIT-LEV 3 EST PT Modifier: Quantity: 1 Physician Procedures : CPT4 Code Description Modifier [id number] 99213 - WC PHYS LEVEL 3 - EST PT ICD-10 Diagnosis Description L89.153 Pressure ulcer of sacral region, stage 3 L89.893 Pressure ulcer of other site, stage 3 G82.54 Quadriplegia, C5-C7 incomplete Z93.1 Gastrostomy  status Quantity: 1 Electronic Signature(s) Signed: 02/03/2023 5:01:56 PM By: Midge Aver MSN RN CNS WTA Signed: 02/03/2023 6:10:41 PM By: Allen Derry PA-C Previous Signature: 02/03/2023 1:16:47 PM Version By: Katharina Caper, Tolbert [name] ([id number]) [id number] Page 9 of 9 Entered By: Midge Aver on 02/03/2023 17:01:56  There is a medium (34-66%) amount of necrotic tissue within the wound bed. Wound #8 status is Healed - Epithelialized. Original cause of wound was Pressure Injury. The date acquired was: 09/05/2021. The wound has been in treatment 68 weeks. The wound is located on the Left Gluteus. The wound measures 0cm length x 0cm width x 0cm depth; 0cm^2 area and 0cm^3 volume. There is Fat Layer (Subcutaneous Tissue) exposed. There is a medium amount of serosanguineous drainage noted. There is large (67-100%) pink granulation within the wound bed. There is a small (1-33%) amount of necrotic tissue within the wound bed. Assessment Active Problems ICD-10 Pressure ulcer of sacral region, stage 3 Pressure ulcer of other site, stage 3 Quadriplegia, C5-C7 incomplete Gastrostomy status Colin, Andreus [name] ([id number]) [id number] Page 8 of 9 Plan Discharge From [facility] Services: Discharge from Wound Care Center Treatment Complete - May use border foam on boney prominence for protection Follow-up Appointments: Other: - call if needed 1. Based on what I am seeing I do believe that the patient is completely healed which is great news. 2. I am going to recommend that the patient should continue with the bordered foam dressing  over the bony prominences to prevent this from breaking down. 3. I would recommend that we continue with appropriate offloading. We will see the patient back for reevaluation as needed. Electronic Signature(s) Signed: 02/03/2023 1:16:31 PM By: Allen Derry PA-C Previous Signature: 02/03/2023 1:16:13 PM Version By: Allen Derry PA-C Entered By: Allen Derry on 02/03/2023 13:16:31 -------------------------------------------------------------------------------- SuperBill Details Patient Name: Date of Service: [name], [place] [name]. 02/03/2023 Medical Record Number: [id number] Patient Account Number: [id number] Date of Birth/Sex: Treating RN: [birth year] (57 y.o. [name] Primary Care Provider: Marylynn Pearson Other Clinician: Referring Provider: Treating Provider/Extender: Lorna Dibble Weeks in Treatment: 68 Diagnosis Coding ICD-10 Codes Code Description L89.153 Pressure ulcer of sacral region, stage 3 L89.893 Pressure ulcer of other site, stage 3 G82.54 Quadriplegia, C5-C7 incomplete Z93.1 Gastrostomy status Facility Procedures : CPT4 Code: 40347425 Description: 99213 - WOUND CARE VISIT-LEV 3 EST PT Modifier: Quantity: 1 Physician Procedures : CPT4 Code Description Modifier [id number] 99213 - WC PHYS LEVEL 3 - EST PT ICD-10 Diagnosis Description L89.153 Pressure ulcer of sacral region, stage 3 L89.893 Pressure ulcer of other site, stage 3 G82.54 Quadriplegia, C5-C7 incomplete Z93.1 Gastrostomy  status Quantity: 1 Electronic Signature(s) Signed: 02/03/2023 5:01:56 PM By: Midge Aver MSN RN CNS WTA Signed: 02/03/2023 6:10:41 PM By: Allen Derry PA-C Previous Signature: 02/03/2023 1:16:47 PM Version By: Katharina Caper, Tolbert [name] ([id number]) [id number] Page 9 of 9 Entered By: Midge Aver on 02/03/2023 17:01:56  Physical Exam Details Patient Name: Date of Service: JAKE, IRIGOYEN 02/03/2023 12:15 PM Medical Record Number: [id number] Patient Account Number: [id number] Date of Birth/Sex: Treating RN: [birth year] (57 y.o. [name] Primary Care Provider: Marylynn Pearson Other Clinician: Referring Provider: Treating Provider/Extender: Lorna Dibble Weeks in Treatment: 68 Constitutional Well-nourished and well-hydrated in no acute distress. Respiratory normal breathing without difficulty. Psychiatric this patient is able to make decisions and demonstrates good insight into disease process. Alert and Oriented x 3. pleasant and cooperative. Notes Upon inspection patient's wound bed showed signs of being completely healed at both locations and is doing excellent. I am actually very pleased with where we stand and I am happy to see that he is doing so well. Especially after we had not seen him in such a long time. Is actually been about 2 months since he was last here due to chair and transportation issues. Electronic Signature(s) Signed: 02/03/2023 1:12:37 PM By: Katharina Caper, Argel [name] ([id number]) [id number] Page 4 of 9 Entered By: Allen Derry on 02/03/2023 13:12:37 -------------------------------------------------------------------------------- Physician Orders Details Patient Name: Date of Service: ZYRION, NIKOLIC 02/03/2023 12:15 PM Medical Record Number: [id number] Patient Account Number: [id number] Date of Birth/Sex: Treating RN: Feb 01, 1966 (57 y.o. [name] Primary Care Provider: Marylynn Pearson Other Clinician: Referring Provider: Treating Provider/Extender: Lorna Dibble Weeks in Treatment: 57 The following information was scribed by: Midge Aver The information was scribed for: Allen Derry Verbal / Phone Orders: No Diagnosis Coding ICD-10 Coding Code Description L89.153 Pressure ulcer of sacral region, stage 3 L89.893 Pressure ulcer of other site, stage 3 G82.54 Quadriplegia, C5-C7 incomplete Z93.1 Gastrostomy status Discharge From [facility] Services Discharge from Wound Care Center Treatment Complete - May use border foam on boney prominence for protection Follow-up Appointments Other: - call if needed Electronic Signature(s) Signed: 02/03/2023 5:00:58 PM By: Midge Aver MSN RN CNS WTA Signed: 02/03/2023 6:10:41 PM By: Allen Derry PA-C Entered By: Midge Aver on 02/03/2023 17:00:58 -------------------------------------------------------------------------------- Problem List Details Patient Name: Date of Service: [name], Sharron [name]. 02/03/2023 12:15 PM Medical Record Number: [id number] Patient Account Number: [id number] Date of Birth/Sex: Treating RN: [birth year] (57 y.o. [name] Primary Care Provider: Marylynn Pearson Other Clinician: Referring Provider: Treating Provider/Extender: Lorna Dibble Weeks in Treatment: [address] Active Problems ICD-10 JAKOBI, DARRISAW [name] ([id number]) [id number] Page 5 of 9 Encounter Code Description Active Date MDM Diagnosis L89.153 Pressure ulcer of sacral region, stage 3 10/15/2021 No Yes L89.893 Pressure ulcer of other site, stage 3 10/15/2021 No Yes G82.54 Quadriplegia, C5-C7 incomplete 10/15/2021 No Yes Z93.1 Gastrostomy status 10/15/2021 No Yes Inactive Problems Resolved Problems Electronic Signature(s) Signed: 02/03/2023 12:33:26 PM By: Allen Derry PA-C Entered By: Allen Derry on 02/03/2023 12:33:26 -------------------------------------------------------------------------------- Progress Note Details Patient Name: Date of  Service: [address], [place] [name]. 02/03/2023 12:15 PM Medical Record Number: [id number] Patient Account Number: [id number] Date of Birth/Sex: Treating RN: 1965-10-29 (57 y.o. [name] Primary Care Provider: Marylynn Pearson Other Clinician: Referring Provider: Treating Provider/Extender: Lorna Dibble Weeks in Treatment: 59 Subjective Chief Complaint Information obtained from Patient Sacral and back pressure ulcers History of Present Illness (HPI) The following HPI elements were documented for the patient's wound: Associated Signs and Symptoms: Patient has a history of confirmed osteomyelitis of the left foot according to MRI July 2019. 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 12/08/17 on evaluation today  Physical Exam Details Patient Name: Date of Service: JAKE, IRIGOYEN 02/03/2023 12:15 PM Medical Record Number: [id number] Patient Account Number: [id number] Date of Birth/Sex: Treating RN: [birth year] (57 y.o. [name] Primary Care Provider: Marylynn Pearson Other Clinician: Referring Provider: Treating Provider/Extender: Lorna Dibble Weeks in Treatment: 68 Constitutional Well-nourished and well-hydrated in no acute distress. Respiratory normal breathing without difficulty. Psychiatric this patient is able to make decisions and demonstrates good insight into disease process. Alert and Oriented x 3. pleasant and cooperative. Notes Upon inspection patient's wound bed showed signs of being completely healed at both locations and is doing excellent. I am actually very pleased with where we stand and I am happy to see that he is doing so well. Especially after we had not seen him in such a long time. Is actually been about 2 months since he was last here due to chair and transportation issues. Electronic Signature(s) Signed: 02/03/2023 1:12:37 PM By: Katharina Caper, Argel [name] ([id number]) [id number] Page 4 of 9 Entered By: Allen Derry on 02/03/2023 13:12:37 -------------------------------------------------------------------------------- Physician Orders Details Patient Name: Date of Service: ZYRION, NIKOLIC 02/03/2023 12:15 PM Medical Record Number: [id number] Patient Account Number: [id number] Date of Birth/Sex: Treating RN: Feb 01, 1966 (57 y.o. [name] Primary Care Provider: Marylynn Pearson Other Clinician: Referring Provider: Treating Provider/Extender: Lorna Dibble Weeks in Treatment: 57 The following information was scribed by: Midge Aver The information was scribed for: Allen Derry Verbal / Phone Orders: No Diagnosis Coding ICD-10 Coding Code Description L89.153 Pressure ulcer of sacral region, stage 3 L89.893 Pressure ulcer of other site, stage 3 G82.54 Quadriplegia, C5-C7 incomplete Z93.1 Gastrostomy status Discharge From [facility] Services Discharge from Wound Care Center Treatment Complete - May use border foam on boney prominence for protection Follow-up Appointments Other: - call if needed Electronic Signature(s) Signed: 02/03/2023 5:00:58 PM By: Midge Aver MSN RN CNS WTA Signed: 02/03/2023 6:10:41 PM By: Allen Derry PA-C Entered By: Midge Aver on 02/03/2023 17:00:58 -------------------------------------------------------------------------------- Problem List Details Patient Name: Date of Service: [name], Sharron [name]. 02/03/2023 12:15 PM Medical Record Number: [id number] Patient Account Number: [id number] Date of Birth/Sex: Treating RN: [birth year] (57 y.o. [name] Primary Care Provider: Marylynn Pearson Other Clinician: Referring Provider: Treating Provider/Extender: Lorna Dibble Weeks in Treatment: [address] Active Problems ICD-10 JAKOBI, DARRISAW [name] ([id number]) [id number] Page 5 of 9 Encounter Code Description Active Date MDM Diagnosis L89.153 Pressure ulcer of sacral region, stage 3 10/15/2021 No Yes L89.893 Pressure ulcer of other site, stage 3 10/15/2021 No Yes G82.54 Quadriplegia, C5-C7 incomplete 10/15/2021 No Yes Z93.1 Gastrostomy status 10/15/2021 No Yes Inactive Problems Resolved Problems Electronic Signature(s) Signed: 02/03/2023 12:33:26 PM By: Allen Derry PA-C Entered By: Allen Derry on 02/03/2023 12:33:26 -------------------------------------------------------------------------------- Progress Note Details Patient Name: Date of  Service: [address], [place] [name]. 02/03/2023 12:15 PM Medical Record Number: [id number] Patient Account Number: [id number] Date of Birth/Sex: Treating RN: 1965-10-29 (57 y.o. [name] Primary Care Provider: Marylynn Pearson Other Clinician: Referring Provider: Treating Provider/Extender: Lorna Dibble Weeks in Treatment: 59 Subjective Chief Complaint Information obtained from Patient Sacral and back pressure ulcers History of Present Illness (HPI) The following HPI elements were documented for the patient's wound: Associated Signs and Symptoms: Patient has a history of confirmed osteomyelitis of the left foot according to MRI July 2019. 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 12/08/17 on evaluation today

## 2023-02-05 NOTE — Progress Notes (Signed)
Type: red, brown red, brown N/A Exudate Color: Medium (34-66%) Large (67-100%) N/A Granulation A mount: Red, Pink Pink N/A Granulation Quality: Medium (34-66%) Small (1-33%) N/A Necrotic A mount: Fat Layer (Subcutaneous Tissue): Yes Fat Layer (Subcutaneous Tissue): Yes N/A Exposed Structures: Fascia: No Tendon: No Muscle: No Joint: No Bone: No N/A None N/A Epithelialization: Sabatino, Govind [name] ([id number]) [id number] Page 5 of 9 Treatment Notes Electronic Signature(s) Signed: 02/04/2023 5:05:03 PM By: Midge Aver MSN RN CNS WTA Entered By: Midge Aver on 02/03/2023 12:36:18 -------------------------------------------------------------------------------- Multi-Disciplinary Care Plan Details Patient Name: Date of Service: [name], [name]. 02/03/2023 12:15 PM Medical Record Number: [id number] Patient Account Number: [id number] Date of Birth/Sex: Treating RN: [birth year] (57 y.o. [name] Primary Care Yoan Sallade: Marylynn Pearson Other Clinician: Referring Trig Mcbryar: Treating Alylah Blakney/Extender: Lorna Dibble Weeks in Treatment: 44 Active Inactive Electronic Signature(s) Signed: 02/03/2023 5:02:03 PM By: Midge Aver MSN RN  CNS WTA Entered By: Midge Aver on 02/03/2023 17:02:03 -------------------------------------------------------------------------------- Pain Assessment Details Patient Name: Date of Service: [name], [name] 02/03/2023 12:15 PM Medical Record Number: [id number] Patient Account Number: [id number] Date of Birth/Sex: Treating RN: Oct 06, 1965 (57 y.o. [name] Primary Care Carolie Mcilrath: Marylynn Pearson Other Clinician: Referring Pieper Kasik: Treating Norton Bivins/Extender: Lorna Dibble Weeks in Treatment: 80 Active Problems Location of Pain Severity and Description of Pain Patient Has Paino No Site Locations [place], [name] ([id number]) [id number] Page 6 of 9 Pain Management and Medication Current Pain Management: Electronic Signature(s) Signed: 02/04/2023 5:05:03 PM By: Midge Aver MSN RN CNS WTA Entered By: Midge Aver on 02/03/2023 12:34:48 -------------------------------------------------------------------------------- Patient/Caregiver Education Details Patient Name: Date of Service: [name] 10/29/2024andnbsp12:15 PM Medical Record Number: [id number] Patient Account Number: [id number] Date of Birth/Gender: Treating RN: July 30, 1965 (57 y.o. [name] Primary Care Physician: Marylynn Pearson Other Clinician: Referring Physician: Treating Physician/Extender: Lorna Dibble Weeks in Treatment: 43 Education Assessment Education Provided To: Patient Education Topics Provided Discharge Packet: Handouts: Congratulations Letter Methods: Explain/Verbal Responses: State content correctly [employer]) Signed: 02/04/2023 5:05:03 PM By: Midge Aver MSN RN CNS WTA Entered By: Midge Aver on 02/03/2023 17:02:43 [name] ([id number]) [id number] Page 7 of 9 -------------------------------------------------------------------------------- Wound  Assessment Details Patient Name: Date of Service: [name], [name] 02/03/2023 12:15 PM Medical Record Number: [id number] Patient Account Number: [id number] Date of Birth/Sex: Treating RN: [birth year] (57 y.o. [name] Primary Care Puja Caffey: Marylynn Pearson Other Clinician: Referring Annya Lizana: Treating Polette Nofsinger/Extender: Lorna Dibble Weeks in Treatment: 42 Wound Status Wound Number: 11 Primary Pressure Ulcer Etiology: Wound Location: Midline Coccyx Wound Healed - Epithelialized Wounding Event: Pressure Injury Status: Date Acquired: 10/21/2022 Comorbid Hypotension, Myocardial Infarction, History of pressure wounds, Weeks Of Treatment: 14 History: Rheumatoid Arthritis, Paraplegia, Confinement Anxiety Clustered Wound: No Photos Wound Measurements Length: (cm) Width: (cm) Depth: (cm) Area: (cm) Volume: (cm) 0 % Reduction in Area: 100% 0 % Reduction in Volume: 100% 0 0 0 Wound Description Classification: Category/Stage III Exudate Amount: Medium Exudate Type: Serosanguineous Exudate Color: red, brown Foul Odor After Cleansing: No Slough/Fibrino Yes Wound Bed Granulation Amount: Medium (34-66%) Exposed Structure Granulation Quality: Red, Pink Fascia Exposed: No Necrotic Amount: Medium (34-66%) Fat Layer (Subcutaneous Tissue) Exposed: Yes Tendon Exposed: No Muscle Exposed: No Joint Exposed: No Bone Exposed: No Treatment Notes Wound #11 (Coccyx) Wound Laterality: Midline Cleanser Peri-Wound Care Topical [name], [name] ([id number]) [id number] Page 8 of 9 Primary Dressing Secondary Dressing Secured With Compression Wrap Compression Stockings Add-Ons Electronic Signature(s)  Type: red, brown red, brown N/A Exudate Color: Medium (34-66%) Large (67-100%) N/A Granulation A mount: Red, Pink Pink N/A Granulation Quality: Medium (34-66%) Small (1-33%) N/A Necrotic A mount: Fat Layer (Subcutaneous Tissue): Yes Fat Layer (Subcutaneous Tissue): Yes N/A Exposed Structures: Fascia: No Tendon: No Muscle: No Joint: No Bone: No N/A None N/A Epithelialization: Sabatino, Govind [name] ([id number]) [id number] Page 5 of 9 Treatment Notes Electronic Signature(s) Signed: 02/04/2023 5:05:03 PM By: Midge Aver MSN RN CNS WTA Entered By: Midge Aver on 02/03/2023 12:36:18 -------------------------------------------------------------------------------- Multi-Disciplinary Care Plan Details Patient Name: Date of Service: [name], [name]. 02/03/2023 12:15 PM Medical Record Number: [id number] Patient Account Number: [id number] Date of Birth/Sex: Treating RN: [birth year] (57 y.o. [name] Primary Care Yoan Sallade: Marylynn Pearson Other Clinician: Referring Trig Mcbryar: Treating Alylah Blakney/Extender: Lorna Dibble Weeks in Treatment: 44 Active Inactive Electronic Signature(s) Signed: 02/03/2023 5:02:03 PM By: Midge Aver MSN RN  CNS WTA Entered By: Midge Aver on 02/03/2023 17:02:03 -------------------------------------------------------------------------------- Pain Assessment Details Patient Name: Date of Service: [name], [name] 02/03/2023 12:15 PM Medical Record Number: [id number] Patient Account Number: [id number] Date of Birth/Sex: Treating RN: Oct 06, 1965 (57 y.o. [name] Primary Care Carolie Mcilrath: Marylynn Pearson Other Clinician: Referring Pieper Kasik: Treating Norton Bivins/Extender: Lorna Dibble Weeks in Treatment: 80 Active Problems Location of Pain Severity and Description of Pain Patient Has Paino No Site Locations [place], [name] ([id number]) [id number] Page 6 of 9 Pain Management and Medication Current Pain Management: Electronic Signature(s) Signed: 02/04/2023 5:05:03 PM By: Midge Aver MSN RN CNS WTA Entered By: Midge Aver on 02/03/2023 12:34:48 -------------------------------------------------------------------------------- Patient/Caregiver Education Details Patient Name: Date of Service: [name] 10/29/2024andnbsp12:15 PM Medical Record Number: [id number] Patient Account Number: [id number] Date of Birth/Gender: Treating RN: July 30, 1965 (57 y.o. [name] Primary Care Physician: Marylynn Pearson Other Clinician: Referring Physician: Treating Physician/Extender: Lorna Dibble Weeks in Treatment: 43 Education Assessment Education Provided To: Patient Education Topics Provided Discharge Packet: Handouts: Congratulations Letter Methods: Explain/Verbal Responses: State content correctly [employer]) Signed: 02/04/2023 5:05:03 PM By: Midge Aver MSN RN CNS WTA Entered By: Midge Aver on 02/03/2023 17:02:43 [name] ([id number]) [id number] Page 7 of 9 -------------------------------------------------------------------------------- Wound  Assessment Details Patient Name: Date of Service: [name], [name] 02/03/2023 12:15 PM Medical Record Number: [id number] Patient Account Number: [id number] Date of Birth/Sex: Treating RN: [birth year] (57 y.o. [name] Primary Care Puja Caffey: Marylynn Pearson Other Clinician: Referring Annya Lizana: Treating Polette Nofsinger/Extender: Lorna Dibble Weeks in Treatment: 42 Wound Status Wound Number: 11 Primary Pressure Ulcer Etiology: Wound Location: Midline Coccyx Wound Healed - Epithelialized Wounding Event: Pressure Injury Status: Date Acquired: 10/21/2022 Comorbid Hypotension, Myocardial Infarction, History of pressure wounds, Weeks Of Treatment: 14 History: Rheumatoid Arthritis, Paraplegia, Confinement Anxiety Clustered Wound: No Photos Wound Measurements Length: (cm) Width: (cm) Depth: (cm) Area: (cm) Volume: (cm) 0 % Reduction in Area: 100% 0 % Reduction in Volume: 100% 0 0 0 Wound Description Classification: Category/Stage III Exudate Amount: Medium Exudate Type: Serosanguineous Exudate Color: red, brown Foul Odor After Cleansing: No Slough/Fibrino Yes Wound Bed Granulation Amount: Medium (34-66%) Exposed Structure Granulation Quality: Red, Pink Fascia Exposed: No Necrotic Amount: Medium (34-66%) Fat Layer (Subcutaneous Tissue) Exposed: Yes Tendon Exposed: No Muscle Exposed: No Joint Exposed: No Bone Exposed: No Treatment Notes Wound #11 (Coccyx) Wound Laterality: Midline Cleanser Peri-Wound Care Topical [name], [name] ([id number]) [id number] Page 8 of 9 Primary Dressing Secondary Dressing Secured With Compression Wrap Compression Stockings Add-Ons Electronic Signature(s)  Complex Wound Measurement - multiple wounds INTERVENTIONS - Wound Dressings X - Small Wound Dressing one or multiple wounds 1 10 []  - 0 Medium Wound Dressing one or multiple wounds []  - 0 Large Wound Dressing one or multiple wounds []  - 0 Application of Medications - topical []  - 0 Application of Medications - injection INTERVENTIONS - Miscellaneous []  - 0 External ear exam []  - 0 Specimen Collection (cultures, biopsies, blood, body fluids, etc.) []  - 0 Specimen(s) / Culture(s) sent or taken to Lab for analysis [name], [name] ([id number]) [id number].pdf Page 3 of 9 X- 1 10 Patient Transfer (multiple staff / Nurse, adult / Similar devices) []  - 0 Simple Staple / Suture removal (25 or less) []  - 0 Complex Staple / Suture removal (26 or more) []  - 0 Hypo / Hyperglycemic Management (close monitor of Blood Glucose) []  - 0 Ankle / Brachial Index (ABI) - do not check if billed separately X- 1 5 Vital Signs Has the patient been seen at the hospital within the last three years: Yes Total Score: 95 Level Of Care: New/Established - Level 3 Electronic Signature(s) Signed: 02/04/2023 5:05:03 PM By: Midge Aver MSN RN CNS WTA Entered By: Midge Aver on 02/03/2023 17:01:46 -------------------------------------------------------------------------------- Encounter Discharge Information Details Patient Name: Date of Service: [name], Ronold [name]. 02/03/2023 12:15 PM Medical Record Number: [id number] Patient Account Number: [id number] Date of Birth/Sex: Treating RN: Sep 11, 1965 (57 y.o. [name] Primary Care Hedy Garro: Marylynn Pearson Other Clinician: Referring Jemmie Ledgerwood: Treating Shelbylynn Walczyk/Extender: Lorna Dibble Weeks in Treatment: 82 Encounter Discharge  Information Items Discharge Condition: Stable Ambulatory Status: Wheelchair Discharge Destination: Home Transportation: Other Accompanied By: self Schedule Follow-up Appointment: No Clinical Summary of Care: Electronic Signature(s) Signed: 02/03/2023 5:03:26 PM By: Midge Aver MSN RN CNS WTA Entered By: Midge Aver on 02/03/2023 17:03:26 -------------------------------------------------------------------------------- Lower Extremity Assessment Details Patient Name: Date of Service: [name], [name]. 02/03/2023 12:15 PM Medical Record Number: [id number] Patient Account Number: [id number] Date of Birth/Sex: Treating RN: 04-09-1965 (57 y.o. [name] Primary Care Xai Frerking: Marylynn Pearson Other Clinician: Referring Oda Lansdowne: Treating Igor Bishop/Extender: Lorna Dibble [name], [name] ([id number]) [id number] Page 4 of 9 Weeks in Treatment: 68 Electronic Signature(s) Signed: 02/04/2023 5:05:03 PM By: Midge Aver MSN RN CNS WTA Entered By: Midge Aver on 02/03/2023 12:36:12 -------------------------------------------------------------------------------- Multi Wound Chart Details Patient Name: Date of Service: [name], [name]. 02/03/2023 12:15 PM Medical Record Number: [id number] Patient Account Number: [id number] Date of Birth/Sex: Treating RN: 1965-11-09 (57 y.o. [name] Primary Care Tyke Outman: Marylynn Pearson Other Clinician: Referring Ione Sandusky: Treating Keilana Morlock/Extender: Lorna Dibble Weeks in Treatment: 68 Vital Signs Height(in): 73 Pulse(bpm): 84 Weight(lbs): 154 Blood Pressure(mmHg): 97/69 Body Mass Index(BMI): 20.3 Temperature(F): 98.3 Respiratory Rate(breaths/min): 18 [11:Photos:] [N/A:N/A] Midline Coccyx Left Gluteus N/A Wound Location: Pressure Injury Pressure Injury N/A Wounding Event: Pressure Ulcer Pressure Ulcer N/A Primary Etiology: Hypotension, Myocardial  Infarction, Hypotension, Myocardial Infarction, N/A Comorbid History: History of pressure wounds, History of pressure wounds, Rheumatoid Arthritis, Paraplegia, Rheumatoid Arthritis, Paraplegia, Confinement Anxiety Confinement Anxiety 10/21/2022 09/05/2021 N/A Date Acquired: 14 68 N/A Weeks of Treatment: Open Open N/A Wound Status: No No N/A Wound Recurrence: 0.1x0.1x0.1 0.1x0.1x0.1 N/A Measurements L x W x D (cm) 0.008 0.008 N/A A (cm) : rea 0.001 0.001 N/A Volume (cm) : 99.90% 99.90% N/A % Reduction in A rea: 99.90% 100.00% N/A % Reduction in Volume: Category/Stage III Category/Stage III N/A Classification: Medium Medium N/A Exudate A mount: Serosanguineous Serosanguineous N/A Exudate  Complex Wound Measurement - multiple wounds INTERVENTIONS - Wound Dressings X - Small Wound Dressing one or multiple wounds 1 10 []  - 0 Medium Wound Dressing one or multiple wounds []  - 0 Large Wound Dressing one or multiple wounds []  - 0 Application of Medications - topical []  - 0 Application of Medications - injection INTERVENTIONS - Miscellaneous []  - 0 External ear exam []  - 0 Specimen Collection (cultures, biopsies, blood, body fluids, etc.) []  - 0 Specimen(s) / Culture(s) sent or taken to Lab for analysis [name], [name] ([id number]) [id number].pdf Page 3 of 9 X- 1 10 Patient Transfer (multiple staff / Nurse, adult / Similar devices) []  - 0 Simple Staple / Suture removal (25 or less) []  - 0 Complex Staple / Suture removal (26 or more) []  - 0 Hypo / Hyperglycemic Management (close monitor of Blood Glucose) []  - 0 Ankle / Brachial Index (ABI) - do not check if billed separately X- 1 5 Vital Signs Has the patient been seen at the hospital within the last three years: Yes Total Score: 95 Level Of Care: New/Established - Level 3 Electronic Signature(s) Signed: 02/04/2023 5:05:03 PM By: Midge Aver MSN RN CNS WTA Entered By: Midge Aver on 02/03/2023 17:01:46 -------------------------------------------------------------------------------- Encounter Discharge Information Details Patient Name: Date of Service: [name], Ronold [name]. 02/03/2023 12:15 PM Medical Record Number: [id number] Patient Account Number: [id number] Date of Birth/Sex: Treating RN: Sep 11, 1965 (57 y.o. [name] Primary Care Hedy Garro: Marylynn Pearson Other Clinician: Referring Jemmie Ledgerwood: Treating Shelbylynn Walczyk/Extender: Lorna Dibble Weeks in Treatment: 82 Encounter Discharge  Information Items Discharge Condition: Stable Ambulatory Status: Wheelchair Discharge Destination: Home Transportation: Other Accompanied By: self Schedule Follow-up Appointment: No Clinical Summary of Care: Electronic Signature(s) Signed: 02/03/2023 5:03:26 PM By: Midge Aver MSN RN CNS WTA Entered By: Midge Aver on 02/03/2023 17:03:26 -------------------------------------------------------------------------------- Lower Extremity Assessment Details Patient Name: Date of Service: [name], [name]. 02/03/2023 12:15 PM Medical Record Number: [id number] Patient Account Number: [id number] Date of Birth/Sex: Treating RN: 04-09-1965 (57 y.o. [name] Primary Care Xai Frerking: Marylynn Pearson Other Clinician: Referring Oda Lansdowne: Treating Igor Bishop/Extender: Lorna Dibble [name], [name] ([id number]) [id number] Page 4 of 9 Weeks in Treatment: 68 Electronic Signature(s) Signed: 02/04/2023 5:05:03 PM By: Midge Aver MSN RN CNS WTA Entered By: Midge Aver on 02/03/2023 12:36:12 -------------------------------------------------------------------------------- Multi Wound Chart Details Patient Name: Date of Service: [name], [name]. 02/03/2023 12:15 PM Medical Record Number: [id number] Patient Account Number: [id number] Date of Birth/Sex: Treating RN: 1965-11-09 (57 y.o. [name] Primary Care Tyke Outman: Marylynn Pearson Other Clinician: Referring Ione [place]us[place]: Treating Keilana Morlock/Extender: Lorna Dibble Weeks in Treatment: 68 Vital Signs Height(in): 73 Pulse(bpm): 84 Weight(lbs): 154 Blood Pressure(mmHg): 97/69 Body Mass Index(BMI): 20.3 Temperature(F): 98.3 Respiratory Rate(breaths/min): 18 [11:Photos:] [N/A:N/A] Midline Coccyx Left Gluteus N/A Wound Location: Pressure Injury Pressure Injury N/A Wounding Event: Pressure Ulcer Pressure Ulcer N/A Primary Etiology: Hypotension, Myocardial  Infarction, Hypotension, Myocardial Infarction, N/A Comorbid History: History of pressure wounds, History of pressure wounds, Rheumatoid Arthritis, Paraplegia, Rheumatoid Arthritis, Paraplegia, Confinement Anxiety Confinement Anxiety 10/21/2022 09/05/2021 N/A Date Acquired: 14 68 N/A Weeks of Treatment: Open Open N/A Wound Status: No No N/A Wound Recurrence: 0.1x0.1x0.1 0.1x0.1x0.1 N/A Measurements L x W x D (cm) 0.008 0.008 N/A A (cm) : rea 0.001 0.001 N/A Volume (cm) : 99.90% 99.90% N/A % Reduction in A rea: 99.90% 100.00% N/A % Reduction in Volume: Category/Stage III Category/Stage III N/A Classification: Medium Medium N/A Exudate A mount: Serosanguineous Serosanguineous N/A Exudate

## 2023-07-15 ENCOUNTER — Encounter: Payer: Self-pay | Admitting: Urology

## 2023-07-15 ENCOUNTER — Ambulatory Visit (INDEPENDENT_AMBULATORY_CARE_PROVIDER_SITE_OTHER): Admitting: Urology

## 2023-07-15 VITALS — BP 112/75 | HR 86

## 2023-07-15 DIAGNOSIS — S14109S Unspecified injury at unspecified level of cervical spinal cord, sequela: Secondary | ICD-10-CM

## 2023-07-15 DIAGNOSIS — R339 Retention of urine, unspecified: Secondary | ICD-10-CM | POA: Diagnosis not present

## 2023-07-15 DIAGNOSIS — N319 Neuromuscular dysfunction of bladder, unspecified: Secondary | ICD-10-CM | POA: Diagnosis not present

## 2023-07-15 DIAGNOSIS — Z978 Presence of other specified devices: Secondary | ICD-10-CM
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
## 2023-07-15 NOTE — Patient Instructions (Signed)
 We discussed option to consider suprapubic (SP) tube placement. We reviewed that this is done via an outpatient surgical procedure performed by Interventional Radiology under sedation. We discussed reasons why SP tube is preferred over long term use of indwelling urethral catheters since urethral catheters can cause urethral sphincter erosion which may result in gross urinary incontinence over time. We discussed risks such as surgical site pain, infections, skin irritation / breakdown, chronic bacteriuria, symptomatic UTls. Patient was advised that SP tube would stay in continuously and that the insertion site will close if catheter is removed for more than a few hours. Discussed that the SP tube would need to be exchanged routinely every 4 weeks to prevent catheter from becoming clogged with sediment and that SP tube exchanges are typically performed at a urology nurse visit or by a home health nurse. We discussed that the SP tube can be removed in the future if patient elects to discontinue that intervention.

## 2023-07-29 ENCOUNTER — Other Ambulatory Visit: Payer: Self-pay | Admitting: Radiology

## 2023-07-30 ENCOUNTER — Ambulatory Visit (HOSPITAL_COMMUNITY)
Admission: RE | Admit: 2023-07-30 | Discharge: 2023-07-30 | Disposition: A | Discharge status: Home / Self Care | Source: Ambulatory Visit | Attending: Urology | Admitting: Urology

## 2023-07-30 ENCOUNTER — Other Ambulatory Visit: Payer: Self-pay

## 2023-07-30 VITALS — BP 114/88 | HR 93 | Temp 97.8°F | Resp 15 | Ht 73.0 in | Wt 110.0 lb

## 2023-07-30 DIAGNOSIS — N319 Neuromuscular dysfunction of bladder, unspecified: Secondary | ICD-10-CM | POA: Diagnosis present

## 2023-07-30 DIAGNOSIS — G8250 Quadriplegia, unspecified: Secondary | ICD-10-CM | POA: Insufficient documentation

## 2023-07-30 DIAGNOSIS — Z978 Presence of other specified devices: Secondary | ICD-10-CM

## 2023-07-30 DIAGNOSIS — R339 Retention of urine, unspecified: Secondary | ICD-10-CM | POA: Diagnosis not present

## 2023-07-30 DIAGNOSIS — S14109S Unspecified injury at unspecified level of cervical spinal cord, sequela: Secondary | ICD-10-CM

## 2023-07-30 MED ORDER — SODIUM CHLORIDE 0.9 % IV BOLUS
500.0000 mL | Freq: Once | INTRAVENOUS | Status: DC
  Administered 2023-07-30: 500 mL via INTRAVENOUS

## 2023-07-30 MED ORDER — MIDAZOLAM HCL 2 MG/2ML IJ SOLN
INTRAMUSCULAR | Status: AC | PRN
Start: 2023-07-30 — End: 2023-07-30
  Administered 2023-07-30: 1 mg via INTRAVENOUS

## 2023-07-30 MED ORDER — FENTANYL CITRATE (PF) 100 MCG/2ML IJ SOLN
INTRAMUSCULAR | Status: AC | PRN
  Administered 2023-07-30: 50 ug via INTRAVENOUS

## 2023-07-30 MED ORDER — MIDAZOLAM HCL 2 MG/2ML IJ SOLN
INTRAMUSCULAR | Status: AC
  Filled 2023-07-30: qty 2

## 2023-07-30 MED ORDER — SODIUM CHLORIDE 0.9 % IV SOLN: INTRAVENOUS | Status: DC

## 2023-07-30 MED ORDER — FENTANYL CITRATE (PF) 100 MCG/2ML IJ SOLN
INTRAMUSCULAR | Status: AC
  Filled 2023-07-30: qty 2

## 2023-07-30 MED ORDER — LIDOCAINE-EPINEPHRINE 1 %-1:100000 IJ SOLN
20.0000 mL | Freq: Once | INTRAMUSCULAR | Status: AC
  Administered 2023-07-30: 20 mL

## 2023-07-30 NOTE — Sedation Documentation (Signed)
 Foley catheter discontinued as per order Dr Mabel Savage
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
## 2023-07-30 NOTE — Procedures (Signed)
 Interventional Radiology Procedure Note  Procedure: Image guided supra-pubic drain placement, .  5F pigtail drain.  Complications: None  EBL: None    Recommendations: - Routine SP drain care, to gravity - advance diet - 1 hr dc home - remove the urethral balloon foley now - routine wound care  Signed,  Marciano Settles. Mabel Savage, DO

## 2023-07-30 NOTE — Sedation Documentation (Signed)
 250cc NS instilled in bladder via foley as per order Dr Mabel Savage

## 2023-07-31 ENCOUNTER — Telehealth: Payer: Self-pay

## 2023-07-31 DIAGNOSIS — N319 Neuromuscular dysfunction of bladder, unspecified: Secondary | ICD-10-CM

## 2023-07-31 MED ORDER — MIRABEGRON ER 50 MG PO TB24: 50.0000 mg | ORAL_TABLET | Freq: Every day | ORAL | 3 refills | Status: DC

## 2023-07-31 NOTE — Telephone Encounter (Signed)
 Patient called in today state's that he is having bladder spasm after sp tube has been placed. Consulted with Dr. Claretta Croft and per MD send in Mirabegron 50 mg for bladder spasm. Patient is made aware that his sp tube will need to be up sized and then he will be scheduled to see provider after up sized and we do his monthly sp tube. Verbalized understanding.

## 2023-07-31 NOTE — Telephone Encounter (Signed)
 Medication prior authorization request received.  Completed PA request through cover my meds for drug Mirabegron ER 50 MG. KEY: [id number]  Approved: Pending

## 2023-08-04 ENCOUNTER — Encounter (HOSPITAL_COMMUNITY): Payer: Self-pay

## 2023-08-04 ENCOUNTER — Emergency Department (HOSPITAL_COMMUNITY)

## 2023-08-04 ENCOUNTER — Other Ambulatory Visit: Payer: Self-pay

## 2023-08-04 ENCOUNTER — Observation Stay (HOSPITAL_COMMUNITY)
Admission: EM | Admit: 2023-08-04 | Discharge: 2023-08-07 | Disposition: A | Discharge status: Home / Self Care | Attending: Family Medicine | Admitting: Family Medicine

## 2023-08-04 DIAGNOSIS — I10 Essential (primary) hypertension: Secondary | ICD-10-CM | POA: Diagnosis not present

## 2023-08-04 DIAGNOSIS — T83510A Infection and inflammatory reaction due to cystostomy catheter, initial encounter: Secondary | ICD-10-CM | POA: Diagnosis not present

## 2023-08-04 DIAGNOSIS — G8250 Quadriplegia, unspecified: Secondary | ICD-10-CM | POA: Insufficient documentation

## 2023-08-04 DIAGNOSIS — L8990 Pressure ulcer of unspecified site, unspecified stage: Secondary | ICD-10-CM | POA: Diagnosis present

## 2023-08-04 DIAGNOSIS — D649 Anemia, unspecified: Secondary | ICD-10-CM | POA: Diagnosis not present

## 2023-08-04 DIAGNOSIS — L89891 Pressure ulcer of other site, stage 1: Secondary | ICD-10-CM | POA: Insufficient documentation

## 2023-08-04 DIAGNOSIS — B9620 Unspecified Escherichia coli [E. coli] as the cause of diseases classified elsewhere: Secondary | ICD-10-CM | POA: Diagnosis not present

## 2023-08-04 DIAGNOSIS — I959 Hypotension, unspecified: Secondary | ICD-10-CM | POA: Diagnosis not present

## 2023-08-04 DIAGNOSIS — N390 Urinary tract infection, site not specified: Secondary | ICD-10-CM | POA: Diagnosis not present

## 2023-08-04 DIAGNOSIS — A419 Sepsis, unspecified organism: Principal | ICD-10-CM | POA: Diagnosis present

## 2023-08-04 DIAGNOSIS — K219 Gastro-esophageal reflux disease without esophagitis: Secondary | ICD-10-CM | POA: Diagnosis not present

## 2023-08-04 DIAGNOSIS — Z978 Presence of other specified devices: Secondary | ICD-10-CM | POA: Diagnosis not present

## 2023-08-04 DIAGNOSIS — Z79899 Other long term (current) drug therapy: Secondary | ICD-10-CM | POA: Insufficient documentation

## 2023-08-04 DIAGNOSIS — R42 Dizziness and giddiness: Secondary | ICD-10-CM | POA: Diagnosis present

## 2023-08-04 DIAGNOSIS — S14109S Unspecified injury at unspecified level of cervical spinal cord, sequela: Secondary | ICD-10-CM | POA: Diagnosis present

## 2023-08-04 DIAGNOSIS — Z9359 Other cystostomy status: Secondary | ICD-10-CM

## 2023-08-04 LAB — URINALYSIS, W/ REFLEX TO CULTURE (INFECTION SUSPECTED)
Bilirubin Urine: NEGATIVE
Glucose, UA: NEGATIVE mg/dL
Ketones, ur: NEGATIVE mg/dL
Nitrite: POSITIVE — AB
Protein, ur: 100 mg/dL — AB
RBC / HPF: >50 RBC/hpf (ref 0–5)
Specific Gravity, Urine: 1.019 (ref 1.005–1.030)
WBC, UA: >50 WBC/hpf (ref 0–5)
pH: 5 (ref 5.0–8.0)

## 2023-08-04 LAB — CBC WITH DIFFERENTIAL/PLATELET
Abs Immature Granulocytes: 0.02 10*3/uL (ref 0.00–0.07)
Basophils Absolute: 0 10*3/uL (ref 0.0–0.1)
Basophils Relative: 0 %
Eosinophils Absolute: 0.2 10*3/uL (ref 0.0–0.5)
Eosinophils Relative: 3 %
HCT: 38.9 % — ABNORMAL LOW (ref 39.0–52.0)
Hemoglobin: 13 g/dL (ref 13.0–17.0)
Immature Granulocytes: 0 %
Lymphocytes Relative: 40 %
Lymphs Abs: 2.7 10*3/uL (ref 0.7–4.0)
MCH: 26.8 pg (ref 26.0–34.0)
MCHC: 33.4 g/dL (ref 30.0–36.0)
MCV: 80.2 fL (ref 80.0–100.0)
Monocytes Absolute: 0.6 10*3/uL (ref 0.1–1.0)
Monocytes Relative: 9 %
Neutro Abs: 3.3 10*3/uL (ref 1.7–7.7)
Neutrophils Relative %: 48 %
Platelets: 380 10*3/uL (ref 150–400)
RBC: 4.85 MIL/uL (ref 4.22–5.81)
RDW: 14.9 % (ref 11.5–15.5)
WBC: 6.8 10*3/uL (ref 4.0–10.5)
nRBC: 0 % (ref 0.0–0.2)

## 2023-08-04 LAB — BASIC METABOLIC PANEL WITH GFR
Anion gap: 10 (ref 5–15)
BUN: 28 mg/dL — ABNORMAL HIGH (ref 6–20)
CO2: 25 mmol/L (ref 22–32)
Calcium: 10.1 mg/dL (ref 8.9–10.3)
Chloride: 101 mmol/L (ref 98–111)
Creatinine, Ser: 0.62 mg/dL (ref 0.61–1.24)
GFR, Estimated: >60 mL/min (ref >60)
Glucose, Bld: 86 mg/dL (ref 70–99)
Potassium: 4 mmol/L (ref 3.5–5.1)
Sodium: 136 mmol/L (ref 135–145)

## 2023-08-04 LAB — CBG MONITORING, ED: Glucose-Capillary: 108 mg/dL — ABNORMAL HIGH (ref 70–99)

## 2023-08-04 LAB — LACTIC ACID, PLASMA: Lactic Acid, Venous: 1.4 mmol/L (ref 0.5–1.9)

## 2023-08-04 MED ORDER — PANTOPRAZOLE SODIUM 40 MG PO TBEC
40.0000 mg | DELAYED_RELEASE_TABLET | Freq: Every day | ORAL | Status: DC
  Administered 2023-08-05 – 2023-08-07 (×3): 40 mg via ORAL
  Filled 2023-08-04 (×3): qty 1

## 2023-08-04 MED ORDER — MIDODRINE HCL 5 MG PO TABS
5.0000 mg | ORAL_TABLET | Freq: Three times a day (TID) | ORAL | Status: DC
  Administered 2023-08-05 – 2023-08-07 (×8): 5 mg via ORAL
  Filled 2023-08-04 (×8): qty 1

## 2023-08-04 MED ORDER — SODIUM CHLORIDE 0.9 % IV BOLUS
1000.0000 mL | Freq: Once | INTRAVENOUS | Status: AC
  Administered 2023-08-04: 1000 mL via INTRAVENOUS

## 2023-08-04 MED ORDER — ENOXAPARIN SODIUM 40 MG/0.4ML IJ SOSY
40.0000 mg | PREFILLED_SYRINGE | INTRAMUSCULAR | Status: DC
  Administered 2023-08-04 – 2023-08-07 (×4): 40 mg via SUBCUTANEOUS
  Filled 2023-08-04 (×4): qty 0.4

## 2023-08-04 MED ORDER — ACETAMINOPHEN 325 MG PO TABS
650.0000 mg | ORAL_TABLET | Freq: Four times a day (QID) | ORAL | Status: DC | PRN
  Administered 2023-08-07: 650 mg via ORAL
  Filled 2023-08-04: qty 2

## 2023-08-04 MED ORDER — GABAPENTIN 300 MG PO CAPS
600.0000 mg | ORAL_CAPSULE | Freq: Three times a day (TID) | ORAL | Status: DC
  Administered 2023-08-04 – 2023-08-07 (×10): 600 mg via ORAL
  Filled 2023-08-04 (×10): qty 2

## 2023-08-04 MED ORDER — SENNOSIDES-DOCUSATE SODIUM 8.6-50 MG PO TABS
1.0000 | ORAL_TABLET | Freq: Two times a day (BID) | ORAL | Status: DC
  Administered 2023-08-05 – 2023-08-07 (×5): 1 via ORAL
  Filled 2023-08-04 (×7): qty 1

## 2023-08-04 MED ORDER — MIDODRINE HCL 5 MG PO TABS
10.0000 mg | ORAL_TABLET | Freq: Once | ORAL | Status: AC
  Administered 2023-08-04: 10 mg via ORAL
  Filled 2023-08-04: qty 2

## 2023-08-04 MED ORDER — SODIUM CHLORIDE 0.9 % IV SOLN
1.0000 g | Freq: Once | INTRAVENOUS | Status: AC
  Administered 2023-08-04: 1 g via INTRAVENOUS
  Filled 2023-08-04: qty 10

## 2023-08-04 MED ORDER — ONDANSETRON HCL 4 MG/2ML IJ SOLN: 4.0000 mg | Freq: Four times a day (QID) | INTRAMUSCULAR | Status: DC | PRN

## 2023-08-04 MED ORDER — SODIUM CHLORIDE 0.9 % IV SOLN
1.0000 g | INTRAVENOUS | Status: DC
  Administered 2023-08-05 – 2023-08-06 (×2): 1 g via INTRAVENOUS
  Filled 2023-08-04 (×2): qty 10

## 2023-08-04 MED ORDER — ALBUTEROL SULFATE (2.5 MG/3ML) 0.083% IN NEBU
2.5000 mg | INHALATION_SOLUTION | Freq: Four times a day (QID) | RESPIRATORY_TRACT | Status: DC | PRN
  Administered 2023-08-05: 2.5 mg via RESPIRATORY_TRACT
  Filled 2023-08-04: qty 3

## 2023-08-04 MED ORDER — SODIUM CHLORIDE 0.9 % IV SOLN: INTRAVENOUS | Status: AC

## 2023-08-04 MED ORDER — IOHEXOL 300 MG/ML  SOLN
100.0000 mL | Freq: Once | INTRAMUSCULAR | Status: AC | PRN
  Administered 2023-08-04: 100 mL via INTRAVENOUS

## 2023-08-04 MED ORDER — BACLOFEN 10 MG PO TABS
10.0000 mg | ORAL_TABLET | Freq: Two times a day (BID) | ORAL | Status: DC
  Administered 2023-08-04 – 2023-08-07 (×7): 10 mg via ORAL
  Filled 2023-08-04 (×8): qty 1

## 2023-08-04 MED ORDER — FLEET ENEMA RE ENEM: 1.0000 | ENEMA | Freq: Once | RECTAL | Status: DC

## 2023-08-04 MED ORDER — POLYETHYLENE GLYCOL 3350 17 G PO PACK
17.0000 g | PACK | Freq: Two times a day (BID) | ORAL | Status: DC
  Filled 2023-08-04 (×6): qty 1

## 2023-08-04 MED ORDER — SMOG ENEMA
960.0000 mL | Freq: Once | RECTAL | Status: DC
  Filled 2023-08-04: qty 960

## 2023-08-04 MED ORDER — ACETAMINOPHEN 650 MG RE SUPP: 650.0000 mg | Freq: Four times a day (QID) | RECTAL | Status: DC | PRN

## 2023-08-04 MED ORDER — HYDROXYZINE HCL 25 MG PO TABS: 25.0000 mg | ORAL_TABLET | Freq: Three times a day (TID) | ORAL | Status: DC | PRN

## 2023-08-04 MED ORDER — ONDANSETRON HCL 4 MG PO TABS: 4.0000 mg | ORAL_TABLET | Freq: Four times a day (QID) | ORAL | Status: DC | PRN

## 2023-08-04 NOTE — Assessment & Plan Note (Deleted)
 Presenting with hypotension, systolic down to 73.  Currently on midodrine . -Resume midodrine .
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
## 2023-08-04 NOTE — Progress Notes (Signed)
   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 Active c/[facility] for wound management. Has necessary DME.

## 2023-08-04 NOTE — ED Provider Notes (Signed)
 [facility] [place] EM[facility]GENCY DEPARTMENT AT [facility] Provider Note   CSN: [id number] Arrival date & time: 08/04/23  1112     History  Chief Complaint  Patient presents with   Post-op Problem    [name] is a 58 y.o. male.  Patient had his history of paraplegia with neurogenic bladder chronic Foley.  He recently underwent a CT-guided suprapubic catheter placement.  He has had some bladder spasms and some urine drainage from his penis and from around the catheter site in his abdomen.  For the last few days he has had some dizziness and sweats.  Especially with trying to sit upright.  No abdominal pain although does not have any routine sensation of his abdomen.  The history is provided by the patient.  Dizziness Quality:  Lightheadedness Severity:  Moderate Onset quality:  Gradual Timing:  Intermittent Progression:  Unchanged Chronicity:  New Context: standing up   Relieved by:  Lying down Worsened by:  Sitting upright and standing up Associated symptoms: no chest pain, no headaches, no nausea and no vomiting        Home Medications Prior to Admission medications   Medication Sig Start Date End Date Taking? Authorizing Provider  ALPRAZolam  (XANAX ) 0.5 MG tablet Take 0.5 mg by mouth daily as needed.    [provider]  amoxicillin  (AMOXIL ) 500 MG capsule Take 1 capsule (500 mg total) by mouth every 8 (eight) hours. 09/20/22   Demaris Fillers, MD  baclofen  (LIORESAL ) 10 MG tablet Take 10 mg by mouth 2 (two) times daily. 09/26/19   [provider]  bisacodyl  (DULCOLAX) 10 MG suppository Place 1 suppository (10 mg total) rectally every Friday. Patient taking differently: Place 10 mg rectally 2 (two) times a week. 10/21/19   Colin Dawley, MD  cetirizine (ZYRTEC) 10 MG tablet Take 10 mg by mouth daily.    [provider]  docusate sodium  (COLACE) 100 MG capsule Take 100 mg by mouth daily as needed. 12/16/19   [provider]   Fexofenadine HCl (MUCINEX ALLERGY PO) Take 1 tablet by mouth every 6 (six) hours as needed (congestion).    [provider]  fluticasone  (FLONASE ) 50 MCG/ACT nasal spray Place 1-2 sprays into both nostrils daily as needed for allergies. 01/06/19   [provider]  gabapentin  (NEURONTIN ) 600 MG tablet Take 600 mg by mouth 2 (two) times daily. 09/26/19   [provider]  midodrine  (PROAMATINE ) 5 MG tablet Take 1 tablet (5 mg total) by mouth 3 (three) times daily with meals. Patient taking differently: Take 5 mg by mouth 3 (three) times daily as needed. 11/05/19   Colin Dawley, MD  mirabegron ER (MYRBETRIQ) 50 MG TB24 tablet Take 1 tablet (50 mg total) by mouth daily. 07/31/23   McKenzie, Arden Beck, MD  Multiple Vitamin (MULTIVITAMIN WITH MINERALS) TABS tablet Take 1 tablet by mouth daily.    [provider]  pantoprazole  (PROTONIX ) 40 MG tablet Take 1 tablet (40 mg total) by mouth daily. 10/18/19   Colin Dawley, MD      Allergies    Niacin and related, Other, and Shellfish allergy    Review of Systems   Review of Systems  Cardiovascular:  Negative for chest pain.  Gastrointestinal:  Negative for nausea and vomiting.  Neurological:  Positive for dizziness. Negative for headaches.    Physical Exam Updated Vital Signs BP (!) 75/64   Pulse 89   Temp 98.4 F (36.9 C) (Oral)   Resp 17  Ht 6\' 1"  (1.854 m)   Wt 49.9 kg   SpO2 95%   BMI 14.51 kg/m  Physical Exam Vitals and nursing note reviewed.  Constitutional:      Appearance: Normal appearance. He is well-developed.  HENT:     Head: Normocephalic and atraumatic.  Eyes:     Conjunctiva/sclera: Conjunctivae normal.  Cardiovascular:     Rate and Rhythm: Normal rate and regular rhythm.     Heart sounds: No murmur heard. Pulmonary:     Effort: Pulmonary effort is normal. No respiratory distress.     Breath sounds: Normal breath sounds.  Abdominal:     Palpations: Abdomen is soft.      Tenderness: There is no abdominal tenderness.     Comments: Patient has a pigtail catheter placed in the lower abdomen.  There is a little bit of erythema at the site.  No drainage.  Musculoskeletal:     Cervical back: Neck supple.  Skin:    General: Skin is warm and dry.  Neurological:     Mental Status: He is alert.     GCS: GCS eye subscore is 4. GCS verbal subscore is 5. GCS motor subscore is 6.     Comments: Patient has good use of his upper extremities, no use of his lower extremities.     ED Results / Procedures / Treatments   Labs (all labs ordered are listed, but only abnormal results are displayed) Labs Reviewed  BASIC METABOLIC PANEL WITH GFR - Abnormal; Notable for the following components:      Result Value   BUN 28 (*)    All other components within normal limits  CBC WITH DIFFERENTIAL/PLATELET - Abnormal; Notable for the following components:   HCT 38.9 (*)    All other components within normal limits  URINALYSIS, W/ REFLEX TO CULTURE (INFECTION SUSPECTED) - Abnormal; Notable for the following components:   APPearance CLOUDY (*)    Hgb urine dipstick MODERATE (*)    Protein, ur 100 (*)    Nitrite POSITIVE (*)    Leukocytes,Ua LARGE (*)    Bacteria, UA MANY (*)    Non Squamous Epithelial 0-5 (*)    All other components within normal limits  CBG MONITORING, ED - Abnormal; Notable for the following components:   Glucose-Capillary 108 (*)    All other components within normal limits  CULTURE, BLOOD (ROUTINE X 2)  CULTURE, BLOOD (ROUTINE X 2)  URINE CULTURE  LACTIC ACID, PLASMA    EKG EKG Interpretation Date/Time:  Tuesday August 04 2023 13:06:53 EDT Ventricular Rate:  84 PR Interval:  135 QRS Duration:  94 QT Interval:  382 QTC Calculation: 452 R Axis:   82  Text Interpretation: Sinus rhythm COPY Confirmed by Racheal Buddle [phone]) on 08/04/2023 4:26:07 PM  Radiology CT ABDOMEN PELVIS W CONTRAST Result Date: 08/04/2023 CLINICAL DATA:  Sepsis recent CT  suprapubic placement. EXAM: CT ABDOMEN AND PELVIS WITH CONTRAST TECHNIQUE: Multidetector CT imaging of the abdomen and pelvis was performed using the standard protocol following bolus administration of intravenous contrast. RADIATION DOSE REDUCTION: This exam was performed according to the departmental dose-optimization program which includes automated exposure control, adjustment of the mA and/or kV according to patient size and/or use of iterative reconstruction technique. CONTRAST:  OMNIPAQUE  IOHEXOL  300 MG/ML  SOLN COMPARISON:  CT scan renal stone protocol from 10/13/2019. FINDINGS: Lower chest: Atelectatic changes noted in the right lung lower lobe. The lung bases are otherwise clear. No pleural effusion. The heart  is normal in size. No pericardial effusion. Hepatobiliary: The liver is normal in size. Non-cirrhotic configuration. No suspicious mass. No intrahepatic or extrahepatic bile duct dilation. Small volume dependent calcified gallstones noted without imaging signs of acute cholecystitis. Normal gallbladder wall thickness. No pericholecystic inflammatory changes. Pancreas: Unremarkable. No pancreatic ductal dilatation or surrounding inflammatory changes. Spleen: Within normal limits. No focal lesion. Adrenals/Urinary Tract: Stable nodule in the left adrenal gland, laterally. Unremarkable right adrenal gland. No suspicious renal mass. Focal scarring noted in the right kidney upper pole. There are at least 3, subcentimeter sized hypoattenuating structures in the right kidney, which are too small to adequately characterize. No nephroureterolithiasis or obstructive uropathy. There is a stable 3 mm dystrophic calcification in the right kidney upper pole. Urinary bladder is decompressed secondary to a suprapubic catheter. Stomach/Bowel: Rectum is markedly dilated measuring up to 8.9 cm in diameter and contains large amount of fecal material, suggesting rectal impaction. Otherwise, no disproportionate  dilation of the small or large bowel loops. No evidence of abnormal bowel wall thickening or inflammatory changes. The appendix was not visualized; however there is no acute inflammatory process in the right lower quadrant. Vascular/Lymphatic: No ascites or pneumoperitoneum. No abdominal or pelvic lymphadenopathy, by size criteria. No aneurysmal dilation of the major abdominal arteries. There are mild peripheral atherosclerotic vascular calcifications of the aorta and its major branches. Reproductive: Normal size prostate. Symmetric seminal vesicles. Other: The visualized soft tissues and abdominal wall are unremarkable. Musculoskeletal: No suspicious osseous lesions. There are mild - moderate multilevel degenerative changes in the visualized spine. Focal resection of lower sacrum noted. IMPRESSION: 1. No acute inflammatory process identified within the abdomen or pelvis. No nephroureterolithiasis or obstructive uropathy. Urinary bladder is decompressed secondary to suprapubic catheter. 2. There is a large amount of impacted stool in the rectum. 3. Multiple other nonacute observations, as described above. Aortic Atherosclerosis (ICD10-I70.0). Electronically Signed   By: Beula Brunswick M.D.   On: 08/04/2023 15:35    Procedures Procedures    Medications Ordered in ED Medications  sodium phosphate  (FLEET) enema 1 enema (has no administration in time range)  sodium chloride  0.9 % bolus 1,000 mL (0 mLs Intravenous Stopped 08/04/23 1542)  sodium chloride  0.9 % bolus 1,000 mL (1,000 mLs Intravenous New Bag/Given 08/04/23 1543)  iohexol  (OMNIPAQUE ) 300 MG/ML solution 100 mL (100 mLs Intravenous Contrast Given 08/04/23 1506)  cefTRIAXone  (ROCEPHIN ) 1 g in sodium chloride  0.9 % 100 mL IVPB (1 g Intravenous New Bag/Given 08/04/23 1632)    ED Course/ Medical Decision Making/ A&P Clinical Course as of 08/04/23 1702  Tue Aug 04, 2023  1315 Patient states his baseline blood pressure is usually 90/70 but does go up  and down due to his autonomic dysfunction from his spinal disease. [MB]  1446 Due to him having no feeling in his abdomen and soft blood pressures will obtain CT imaging to make sure there is not an abscess or other complication of his recent procedure. [MB]  1517 Received sign out from Dr. Randal Bury pending CT scan, presenting with low blood pressure, dizziness. Has paraplegia and can't feel abdomen.  [WS]    Clinical Course User Index [MB] Tonya Fredrickson, MD [WS] Mordecai Applebaum, MD                                 Medical Decision Making Amount and/or Complexity of Data Reviewed Labs: ordered. Radiology: ordered.  Risk OTC drugs.  Prescription drug management. Decision regarding hospitalization.   This patient complains of recent suprapubic placement, dizziness lightheadedness diaphoresis; this involves an extensive number of treatment Options and is a complaint that carries with it a high risk of complications and morbidity. The differential includes sepsis, Sirs, hypovolemia, hypotension, abscess, malpositioned catheter  I ordered, reviewed and interpreted labs, which included CBC with normal white count normal hemoglobin, chemistries unremarkable, lactate normal, urinalysis with probable sign of infection, blood culture sent I ordered medication IV fluids and reviewed PMP when indicated. I ordered imaging studies which included CT abdomen and pelvis and I independently    visualized and interpreted imaging which showed appropriately positioned catheter.  Final reading pending Previous records obtained and reviewed in epic including radiology notes of catheter placement Cardiac monitoring reviewed, normal sinus rhythm Social determinants considered, no significant barriers Critical Interventions: None  After the interventions stated above, I reevaluated the patient and found patient to be awake alert nontoxic blood pressures bouncing around a little bit but seem to be a  little bit lower than his baseline Admission and further testing considered, his care is signed out to Dr. Isaiah Marc to follow-up on results of CT.  Likely will need antibiotics, possible admission         Final Clinical Impression(s) / ED Diagnoses Final diagnoses:  Hypotension, unspecified hypotension type  Lightheadedness  Urinary tract infection associated with cystostomy catheter, initial encounter [facility])    Rx / DC Orders ED Discharge Orders     None         Tonya Fredrickson, MD 08/04/23 1707

## 2023-08-04 NOTE — ED Provider Notes (Signed)
  Physical Exam  BP 124/75   Pulse 73   Temp 98.4 F (36.9 C) (Oral)   Resp 15   Ht 6\' 1"  (1.854 m)   Wt 49.9 kg   SpO2 96%   BMI 14.51 kg/m   Physical Exam  Procedures  .Critical Care  Performed by: Mordecai Applebaum, MD Authorized by: Mordecai Applebaum, MD   Critical care provider statement:    Critical care time (minutes):  30   Critical care was necessary to treat or prevent imminent or life-threatening deterioration of the following conditions:  Sepsis and dehydration   Critical care was time spent personally by me on the following activities:  Development of treatment plan with patient or surrogate, discussions with consultants, evaluation of patient's response to treatment, examination of patient, ordering and review of laboratory studies, ordering and review of radiographic studies, ordering and performing treatments and interventions, pulse oximetry, re-evaluation of patient's condition and review of old charts   Care discussed with: admitting provider     ED Course / MDM   Clinical Course as of 08/04/23 1928  Tue Aug 04, 2023  1315 Patient states his baseline blood pressure is usually 90/70 but does go up and down due to his autonomic dysfunction from his spinal disease. [MB]  1446 Due to him having no feeling in his abdomen and soft blood pressures will obtain CT imaging to make sure there is not an abscess or other complication of his recent procedure. [MB]  1517 Received sign out from Dr. Randal Bury pending CT scan, presenting with low blood pressure, dizziness. Has paraplegia and can't feel abdomen.  [WS]  1927 Patient had some persistent low blood pressure.  Received midodrine , total of 3 L of fluids with improvement.  Initially discussed with hospitalist who was hoping that we could provide additional fluid resuscitation for improvement in blood pressure.  Blood pressures improved, discussed with the hospitalist who has admitted the patient for further management [WS]     Clinical Course User Index [MB] Tonya Fredrickson, MD [WS] Mordecai Applebaum, MD   Medical Decision Making Amount and/or Complexity of Data Reviewed Labs: ordered. Radiology: ordered.  Risk OTC drugs. Prescription drug management. Decision regarding hospitalization.         Mordecai Applebaum, MD 08/04/23 Carlette Cheers

## 2023-08-04 NOTE — Assessment & Plan Note (Addendum)
 Presenting with hypotension, dizziness, diaphoresis, systolic down to 73.  Improved after fluid bolus and midodrine .  Blood pressure typically on the soft side but not this low at baseline.  Denies GI losses, stable oral intake and hemoglobin stable.  Concern for infectious etiology possibly UTI versus autonomic dysfunction from quadriplegia.  UA suggestive of UTI with positive nitrites positive leukocytes.  But patient also likely colonized considering presence of chronic indwelling catheters, and recent suprapubic catheter.  Lactic acid 1 .4.  WBC 6.8.   Not meeting sepsis criteria.  Denies chronic wounds at this time.  CT A/P-not suggestive of infectious etiology. -Last urine cultures 10/2022-grew Serratia marcescens's, resistant to nitrofurantoin . -IV ceftriaxone  1 g daily -Follow-up blood cultures and urine cultures - 3L bolus given, cont N/s 100cc/hr x 10hrs

## 2023-08-04 NOTE — Assessment & Plan Note (Signed)
 Has chronic suprapubic catheter placed 4/24.  Reports some leaking around the catheter which she was told was expected.

## 2023-08-04 NOTE — ED Triage Notes (Signed)
 Pt reports he had a supra pubic catheter placed at [facility] long this past Thursday and he is continuing to leak out of his penis and insertion site but is having adequate drainage into the drainage bag.  Pt is concerned that his urine is cloudy and says his diaphoresis is worse.
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
## 2023-08-04 NOTE — ED Notes (Signed)
 Company secretary to re page hospitalist. Edwina Gram

## 2023-08-05 DIAGNOSIS — G8250 Quadriplegia, unspecified: Secondary | ICD-10-CM | POA: Diagnosis not present

## 2023-08-05 DIAGNOSIS — Z978 Presence of other specified devices: Secondary | ICD-10-CM | POA: Diagnosis not present

## 2023-08-05 DIAGNOSIS — I9589 Other hypotension: Secondary | ICD-10-CM

## 2023-08-05 DIAGNOSIS — I10 Essential (primary) hypertension: Secondary | ICD-10-CM | POA: Diagnosis not present

## 2023-08-05 LAB — CBC
HCT: 31.1 % — ABNORMAL LOW (ref 39.0–52.0)
Hemoglobin: 10.1 g/dL — ABNORMAL LOW (ref 13.0–17.0)
MCH: 26.2 pg (ref 26.0–34.0)
MCHC: 32.5 g/dL (ref 30.0–36.0)
MCV: 80.6 fL (ref 80.0–100.0)
Platelets: 324 10*3/uL (ref 150–400)
RBC: 3.86 MIL/uL — ABNORMAL LOW (ref 4.22–5.81)
RDW: 14.9 % (ref 11.5–15.5)
WBC: 6.8 10*3/uL (ref 4.0–10.5)
nRBC: 0 % (ref 0.0–0.2)

## 2023-08-05 LAB — BASIC METABOLIC PANEL WITH GFR
Anion gap: 8 (ref 5–15)
BUN: 16 mg/dL (ref 6–20)
CO2: 20 mmol/L — ABNORMAL LOW (ref 22–32)
Calcium: 8.7 mg/dL — ABNORMAL LOW (ref 8.9–10.3)
Chloride: 109 mmol/L (ref 98–111)
Creatinine, Ser: 0.37 mg/dL — ABNORMAL LOW (ref 0.61–1.24)
GFR, Estimated: >60 mL/min (ref >60)
Glucose, Bld: 75 mg/dL (ref 70–99)
Potassium: 3.5 mmol/L (ref 3.5–5.1)
Sodium: 137 mmol/L (ref 135–145)

## 2023-08-05 MED ORDER — BISACODYL 10 MG RE SUPP
10.0000 mg | Freq: Once | RECTAL | Status: AC
  Administered 2023-08-05: 10 mg via RECTAL
  Filled 2023-08-05: qty 1

## 2023-08-05 MED ORDER — BISACODYL 10 MG RE SUPP
10.0000 mg | Freq: Every day | RECTAL | Status: DC
  Filled 2023-08-05 (×2): qty 1

## 2023-08-05 NOTE — Plan of Care (Signed)
   Problem: Education: Goal: Knowledge of General Education information will improve Description Including pain rating scale, medication(s)/side effects and non-pharmacologic comfort measures Outcome: Progressing
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
## 2023-08-05 NOTE — Care Management Obs Status (Signed)
 MEDICARE OBSERVATION STATUS NOTIFICATION   Patient Details  Name: [name] MRN: [id number] Date of Birth: [birth year]   Medicare Observation Status Notification Given:  Yes    Geraldina Klinefelter, RN 08/05/2023, 3:42 PM

## 2023-08-05 NOTE — Plan of Care (Signed)
   Problem: Activity: Goal: Risk for activity intolerance will decrease Outcome: Progressing

## 2023-08-05 NOTE — Progress Notes (Signed)
   08/05/23 1125  Vitals  Temp 98.3 F (36.8 C)  Temp Source Oral  BP (!) 160/97  MAP (mmHg) 113  Pulse Rate (!) 57  Level of Consciousness  Level of Consciousness Alert  Oxygen  Therapy  SpO2 100 %  O2 Device Room Air   Patient is having chills and sweating. Vital signs taken and MD Emokpae notified.

## 2023-08-06 DIAGNOSIS — I9589 Other hypotension: Secondary | ICD-10-CM | POA: Diagnosis not present

## 2023-08-06 DIAGNOSIS — A419 Sepsis, unspecified organism: Secondary | ICD-10-CM | POA: Diagnosis present

## 2023-08-06 DIAGNOSIS — G8250 Quadriplegia, unspecified: Secondary | ICD-10-CM | POA: Diagnosis not present

## 2023-08-06 DIAGNOSIS — Z9359 Other cystostomy status: Secondary | ICD-10-CM

## 2023-08-06 DIAGNOSIS — N390 Urinary tract infection, site not specified: Secondary | ICD-10-CM | POA: Diagnosis present

## 2023-08-06 DIAGNOSIS — I10 Essential (primary) hypertension: Secondary | ICD-10-CM | POA: Diagnosis not present

## 2023-08-06 DIAGNOSIS — Z978 Presence of other specified devices: Secondary | ICD-10-CM | POA: Diagnosis not present

## 2023-08-06 LAB — URINE CULTURE: Culture: >=100000 — AB

## 2023-08-06 MED ORDER — ZINC OXIDE 40 % EX OINT
TOPICAL_OINTMENT | Freq: Two times a day (BID) | CUTANEOUS | Status: DC
  Filled 2023-08-06: qty 57

## 2023-08-06 MED ORDER — CHLORHEXIDINE GLUCONATE CLOTH 2 % EX PADS
6.0000 | MEDICATED_PAD | Freq: Every day | CUTANEOUS | Status: DC
  Administered 2023-08-06 – 2023-08-07 (×2): 6 via TOPICAL

## 2023-08-06 MED ORDER — AMMONIUM LACTATE 12 % EX LOTN
TOPICAL_LOTION | Freq: Every day | CUTANEOUS | Status: DC
  Filled 2023-08-06: qty 400

## 2023-08-06 MED ORDER — MAGNESIUM CITRATE PO SOLN: 1.0000 | Freq: Once | ORAL | Status: DC

## 2023-08-06 NOTE — Plan of Care (Signed)

## 2023-08-06 NOTE — Plan of Care (Signed)

## 2023-08-06 NOTE — Consult Note (Signed)
 WOC Nurse Consult Note: this patient is a quadriplegic with history of Stage 3 sacral/buttocks pressure injuries and chronic full thickness wounds to B legs, has had skin grafts placed to feet at [facility]; patient is followed at [facility] with most recent visit 07/09/2023; they are using Lac-Hydrin  to intact skin of legs and Calcium Alginate to any draining wounds  Reason for Consult:multiple wounds  Wound type: 1,  Healing Stage 3 Pressure Injuries sacrum/buttocks 2.  Full thickness B lower legs  3.  L heel DTPI  Pressure Injury POA: Yes Measurement: see nursing flowsheet  Wound bed: 1.  Sacrum/buttocks appears to be healed scar tissue, one small area R ischium noted that is red and moist  2.  B feet and lower legs with scattered black eschar and pink skin  3. L heel purple maroon discoloration  Drainage (amount, consistency, odor) see nursing flowsheet  Periwound: scar tissue to sacrum/buttocks  Dressing procedure/placement/frequency:  Cleanse legs with soap and water , dry and apply Calcium Alginate Timm Foot [phone]) to any open draining wounds.  Apply Ammonia Lactate cream to intact skin of legs (DO NOT PLACE ON FEET).  If Calcium Alginate is used may cover with ABD pad and Kerlix to secure.  MOISTEN CALCIUM ALGINATE WITH SALINE FOR ATRAUMATIC REMOVAL.   Cleanse buttocks/sacrum with soap and water , dry and apply a thin layer of Desitin 2 times a day and prn soiling. Cover entire area including R ischium with silicone foam.   Cleanse L heel with soap and water , dry and apply silicone foam.  Lift foam daily to assess area and place L heel in offloading boots that patient wears from home or Prevalon boot Timm Foot [phone])  Patient should be placed on a low air loss mattress for moisture management and pressure redistribution due to quadriplegia and history of pressure injuries.    POC discussed with bedside nurse. WOC team will not follow. Re-consult if further needs arise.    Thank  you,    Jadrian Bulman MSN, RN-BC, CWOCN [phone]  PER [facility] NOTE 07/09/2023 Continue calcium alginate and ABD pad with Kerlix to secure periodically as needed to any open wounds. Limited overlap of the alginate onto intact skin to avoid pulling intact skin loose and creating a traumatic lesion. I emphasized the need to moisten the alginate thoroughly before removing to avoid pulling off intact skin.  Continue AmLactin cream for dry and desiccated skin. Avoid putting the AmLactin cream on the dry skin on the dorsal foot bilaterally however. These are where the grafts occurred performed by Dr. Tonye Free to get good skin coverage over the previous chronic osteomyelitis. I do not want to lift up that intact skin to over exposed already fragile skin. Patient had good understanding of where not to use the AmLactin cream.
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
## 2023-08-06 NOTE — Plan of Care (Signed)
   Problem: Activity: Goal: Risk for activity intolerance will decrease Outcome: Progressing

## 2023-08-07 DIAGNOSIS — A419 Sepsis, unspecified organism: Principal | ICD-10-CM

## 2023-08-07 DIAGNOSIS — N390 Urinary tract infection, site not specified: Secondary | ICD-10-CM | POA: Diagnosis not present

## 2023-08-07 LAB — CBC
HCT: 28.2 % — ABNORMAL LOW (ref 39.0–52.0)
Hemoglobin: 9.5 g/dL — ABNORMAL LOW (ref 13.0–17.0)
MCH: 26.7 pg (ref 26.0–34.0)
MCHC: 33.7 g/dL (ref 30.0–36.0)
MCV: 79.2 fL — ABNORMAL LOW (ref 80.0–100.0)
Platelets: 352 10*3/uL (ref 150–400)
RBC: 3.56 MIL/uL — ABNORMAL LOW (ref 4.22–5.81)
RDW: 14.4 % (ref 11.5–15.5)
WBC: 5.4 10*3/uL (ref 4.0–10.5)
nRBC: 0 % (ref 0.0–0.2)

## 2023-08-07 LAB — BASIC METABOLIC PANEL WITH GFR
Anion gap: 9 (ref 5–15)
BUN: 10 mg/dL (ref 6–20)
CO2: 22 mmol/L (ref 22–32)
Calcium: 8.8 mg/dL — ABNORMAL LOW (ref 8.9–10.3)
Chloride: 107 mmol/L (ref 98–111)
Creatinine, Ser: 0.38 mg/dL — ABNORMAL LOW (ref 0.61–1.24)
GFR, Estimated: >60 mL/min (ref >60)
Glucose, Bld: 83 mg/dL (ref 70–99)
Potassium: 3 mmol/L — ABNORMAL LOW (ref 3.5–5.1)
Sodium: 138 mmol/L (ref 135–145)

## 2023-08-07 MED ORDER — CEPHALEXIN 500 MG PO CAPS: 500.0000 mg | ORAL_CAPSULE | Freq: Three times a day (TID) | ORAL | 0 refills | Status: AC

## 2023-08-07 MED ORDER — SODIUM CHLORIDE 0.9 % IV SOLN
1.0000 g | Freq: Once | INTRAVENOUS | Status: AC
  Administered 2023-08-07: 1 g via INTRAVENOUS
  Filled 2023-08-07: qty 10

## 2023-08-07 MED ORDER — BISACODYL 10 MG RE SUPP: 10.0000 mg | RECTAL | 5 refills | Status: AC

## 2023-08-07 MED ORDER — BACLOFEN 10 MG PO TABS
10.0000 mg | ORAL_TABLET | Freq: Two times a day (BID) | ORAL | 5 refills | Status: AC
Start: 2023-08-07 — End: ?

## 2023-08-07 MED ORDER — ZINC OXIDE 40 % EX OINT: TOPICAL_OINTMENT | Freq: Two times a day (BID) | CUTANEOUS | 0 refills | Status: AC

## 2023-08-07 MED ORDER — AMMONIUM LACTATE 12 % EX LOTN: TOPICAL_LOTION | Freq: Every day | CUTANEOUS | 0 refills | Status: AC

## 2023-08-07 MED ORDER — SENNOSIDES-DOCUSATE SODIUM 8.6-50 MG PO TABS: 2.0000 | ORAL_TABLET | Freq: Two times a day (BID) | ORAL | 5 refills | Status: AC

## 2023-08-07 MED ORDER — HYDROXYZINE HCL 25 MG PO TABS: 25.0000 mg | ORAL_TABLET | Freq: Three times a day (TID) | ORAL | 5 refills | Status: AC | PRN

## 2023-08-07 MED ORDER — ACETAMINOPHEN 325 MG PO TABS: 650.0000 mg | ORAL_TABLET | Freq: Four times a day (QID) | ORAL | Status: AC | PRN

## 2023-08-07 MED ORDER — ALBUTEROL SULFATE (2.5 MG/3ML) 0.083% IN NEBU: 2.5000 mg | INHALATION_SOLUTION | Freq: Four times a day (QID) | RESPIRATORY_TRACT | 12 refills | Status: AC | PRN

## 2023-08-07 MED ORDER — BISACODYL 10 MG RE SUPP: 10.0000 mg | RECTAL | 0 refills | Status: AC | PRN

## 2023-08-07 NOTE — Plan of Care (Signed)
   Problem: Activity: Goal: Risk for activity intolerance will decrease Outcome: Progressing   Problem: Coping: Goal: Level of anxiety will decrease Outcome: Progressing
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
## 2023-08-07 NOTE — Discharge Instructions (Signed)
 1) please follow-up with your urologist within a week for recheck and reevaluation--- your suprapubic catheter may have to be changed sooner rather than later 2) please drink plenty fluids and avoid dehydration

## 2023-08-07 NOTE — TOC Transition Note (Signed)
 Transition of Care [facility]) - Discharge Note   Patient Details  Name: [name] MRN: [id number] Date of Birth: 09-Dec-1965  [facility] CM/SW Contact:  Grandville Lax, LCSWA Phone Number: 08/07/2023, 10:07 AM   Clinical Narrative:    CSW updated that pt is medically stable for D/C home with [facility] RN. CSW provided update to Mariah Shines with [facility], they will follow in the community. CSW requested that MD place [facility] orders. TOC signing off.   Final next level of care: Home w Home Health Services Barriers to Discharge: Continued Medical Work up   Patient Goals and CMS Choice Patient states their goals for this hospitalization and ongoing recovery are:: return home CMS Medicare.gov Compare Post Acute Care list provided to:: Patient Choice offered to / list presented to : Patient      Discharge Placement                       Discharge Plan and Services Additional resources added to the After Visit Summary for                            [facility] Arranged: RN [facility] Agency: [facility] Date [facility] Agency Contacted: 08/07/23   Representative spoke with at [facility] Agency: Mariah Shines  Social Drivers of Health (SDOH) Interventions SDOH Screenings   Food Insecurity: No Food Insecurity (08/04/2023)  Housing: Unknown (08/06/2023)  Transportation Needs: No Transportation Needs (08/04/2023)  Utilities: Not At Risk (08/04/2023)  Financial Resource Strain: Low Risk  (09/12/2020)   Received from [facility]  Tobacco Use: Low Risk  (08/04/2023)     Readmission Risk Interventions     No data to display

## 2023-08-07 NOTE — Progress Notes (Signed)
 Pt refused any wound care to legs and feet. States he changes bandages every 3 days with home health and does not need them changed every day.

## 2023-08-09 LAB — CULTURE, BLOOD (ROUTINE X 2)
Culture: NO GROWTH
Culture: NO GROWTH
Special Requests: ADEQUATE

## 2023-08-10 ENCOUNTER — Other Ambulatory Visit: Payer: Self-pay | Admitting: Radiology
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
## 2023-08-11 ENCOUNTER — Ambulatory Visit (HOSPITAL_COMMUNITY): Admit: 2023-08-11

## 2023-08-11 ENCOUNTER — Other Ambulatory Visit: Payer: Self-pay | Admitting: Radiology

## 2023-08-12 ENCOUNTER — Other Ambulatory Visit: Payer: Self-pay | Admitting: Radiology

## 2023-08-12 ENCOUNTER — Telehealth: Payer: Self-pay

## 2023-08-12 NOTE — Telephone Encounter (Signed)
 Called patient to make him aware. Patient prefer to hold off on medication at this time. Patient is scheduled with IR on Friday due infection. Patient is currently on abt.

## 2023-08-12 NOTE — Telephone Encounter (Signed)
-----   Message from Lauretta Ponto sent at 08/12/2023  8:50 AM EDT ----- Is he still having bothersome bladder spasms? If so I will order a different medication. ----- Message ----- From: Audra Blend, CMA Sent: 08/03/2023   1:57 PM EDT To: Sarah C Larocco, FNP  Medication was denial due to not tried and failed other alternatives

## 2023-08-14 ENCOUNTER — Other Ambulatory Visit: Payer: Self-pay

## 2023-08-14 ENCOUNTER — Ambulatory Visit (HOSPITAL_COMMUNITY)
Admission: RE | Admit: 2023-08-14 | Discharge: 2023-08-14 | Disposition: A | Discharge status: Home / Self Care | Source: Ambulatory Visit | Attending: Radiology | Admitting: Radiology

## 2023-08-14 DIAGNOSIS — Z435 Encounter for attention to cystostomy: Secondary | ICD-10-CM | POA: Insufficient documentation

## 2023-08-14 DIAGNOSIS — R339 Retention of urine, unspecified: Secondary | ICD-10-CM | POA: Diagnosis not present

## 2023-08-14 HISTORY — PX: IR CYSTOSTOMY TUBE CHANGE COMPLICATED W IMG: IMG1098

## 2023-08-14 HISTORY — PX: IR CATHETER TUBE CHANGE: IMG717

## 2023-08-14 MED ORDER — FENTANYL CITRATE (PF) 100 MCG/2ML IJ SOLN
INTRAMUSCULAR | Status: AC | PRN
  Administered 2023-08-14: 50 ug via INTRAVENOUS

## 2023-08-14 MED ORDER — FENTANYL CITRATE (PF) 100 MCG/2ML IJ SOLN
INTRAMUSCULAR | Status: AC
  Filled 2023-08-14: qty 2

## 2023-08-14 MED ORDER — LIDOCAINE VISCOUS HCL 2 % MT SOLN
OROMUCOSAL | Status: AC
  Filled 2023-08-14: qty 15

## 2023-08-14 MED ORDER — LIDOCAINE VISCOUS HCL 2 % MT SOLN
15.0000 mL | Freq: Once | OROMUCOSAL | Status: AC
  Administered 2023-08-14: 5 mL via OROMUCOSAL

## 2023-08-14 MED ORDER — LIDOCAINE HCL 1 % IJ SOLN
10.0000 mL | Freq: Once | INTRAMUSCULAR | Status: AC
  Administered 2023-08-14: 5 mL

## 2023-08-14 MED ORDER — IOHEXOL 300 MG/ML  SOLN
50.0000 mL | Freq: Once | INTRAMUSCULAR | Status: AC | PRN
  Administered 2023-08-14: 10 mL

## 2023-08-14 MED ORDER — LIDOCAINE HCL 1 % IJ SOLN
INTRAMUSCULAR | Status: AC
  Filled 2023-08-14: qty 20

## 2023-08-14 MED ORDER — MIDAZOLAM HCL 2 MG/2ML IJ SOLN
INTRAMUSCULAR | Status: AC
  Filled 2023-08-14: qty 2

## 2023-08-14 MED ORDER — SODIUM CHLORIDE 0.9 % IV SOLN: INTRAVENOUS | Status: DC

## 2023-08-14 MED ORDER — MIDAZOLAM HCL 2 MG/2ML IJ SOLN
INTRAMUSCULAR | Status: AC | PRN
  Administered 2023-08-14: 1 mg via INTRAVENOUS

## 2023-08-14 NOTE — Procedures (Signed)
 Interventional Radiology Procedure Note  Procedure: Successful exchange and upsize to a 25F Councill-tip foley catheter.   Complications: None  Estimated Blood Loss: None  Recommendations: - DC home - F/U with Urology for routine exchanges   Signed,  Roxie Cord, MD

## 2023-08-24 DIAGNOSIS — Z931 Gastrostomy status: Secondary | ICD-10-CM | POA: Insufficient documentation

## 2023-08-24 DIAGNOSIS — Z930 Tracheostomy status: Secondary | ICD-10-CM | POA: Insufficient documentation

## 2023-08-24 DIAGNOSIS — F419 Anxiety disorder, unspecified: Secondary | ICD-10-CM | POA: Insufficient documentation

## 2023-08-24 DIAGNOSIS — Z981 Arthrodesis status: Secondary | ICD-10-CM | POA: Insufficient documentation

## 2023-08-24 DIAGNOSIS — N319 Neuromuscular dysfunction of bladder, unspecified: Secondary | ICD-10-CM | POA: Insufficient documentation

## 2023-08-24 DIAGNOSIS — M1990 Unspecified osteoarthritis, unspecified site: Secondary | ICD-10-CM | POA: Insufficient documentation

## 2023-08-24 DIAGNOSIS — Z993 Dependence on wheelchair: Secondary | ICD-10-CM | POA: Insufficient documentation

## 2023-09-07 ENCOUNTER — Other Ambulatory Visit (HOSPITAL_COMMUNITY): Payer: Self-pay | Admitting: Radiology

## 2023-09-07 ENCOUNTER — Encounter (HOSPITAL_COMMUNITY): Payer: Self-pay | Admitting: Radiology

## 2023-09-07 DIAGNOSIS — R339 Retention of urine, unspecified: Secondary | ICD-10-CM

## 2023-09-15 ENCOUNTER — Ambulatory Visit

## 2023-10-23 ENCOUNTER — Telehealth: Payer: Self-pay | Admitting: Urology

## 2023-10-23 NOTE — Telephone Encounter (Signed)
 Patient is bladder spasms and wants to know if there is something he can take. Insurance would not approve Mirabegron 

## 2023-10-23 NOTE — Telephone Encounter (Signed)
 Pt is made aware a message will be sent to [name] on recommendation. Pt voiced understanding.

## 2023-10-26 NOTE — Telephone Encounter (Signed)
 Pt is made aware and voiced understanding Please appeal insurance denial of Myrbetriq  on the grounds that patient has a documented history of chronic constipation / neurogenic bowel secondary to his spinal cord injury and is therefore not a safe candidate for use of anticholinergic medications since they can exacerbate constipation. In the meantime he can have Gemtesa samples. Thanks

## 2023-10-27 ENCOUNTER — Telehealth: Payer: Self-pay

## 2023-10-27 NOTE — Telephone Encounter (Signed)
 Return call to pt. Pt state's he want to know if he is able to flush his sp tube due to build up sediment at night and state's he think his bladder spasm could be from build up sediment. Pt state's this happen sometime not on a regularly basis. Pt state's he has the supplies to flush the SP tube.  Pt is made aware a message will be sent to Sarah on advisement.

## 2023-10-28 NOTE — Telephone Encounter (Signed)
 Tried calling pt with no answer, left vm for return call Yes ok to flush as needed

## 2023-10-29 NOTE — Telephone Encounter (Signed)
 Pt is made per Lauraine Yes ok to flush as needed Pt voiced understanding.

## 2023-10-30 ENCOUNTER — Encounter: Payer: Self-pay | Admitting: Urology

## 2023-10-30 NOTE — Progress Notes (Signed)
 He is not a safe candidate for anticholinergic medications due to risk for side effects based on patient's age, comorbidities, and pre-existing chronic constipation / neurogenic bowel / neurogenic bladder / chronic urinary retention.   Lauraine Oz, MSN, FNP-C, [facility] Urology Nurse Practitioner [facility] [place]

## 2023-10-30 NOTE — Telephone Encounter (Signed)
 Tried to do appeal through [place], [place] informed me that patient's insurance was terminated on 08/05/2023. Called pt whom state's he has medicare and was advised to bring insurance card to be scan into his chart. Pt state's he has to get in contact with social security and find out what is going on with his insurance.

## 2023-11-03 ENCOUNTER — Telehealth: Payer: Self-pay | Admitting: Urology

## 2023-11-03 NOTE — Telephone Encounter (Signed)
 Patient called to email his insurance card over so that he can get authorization for his medication

## 2023-12-10 ENCOUNTER — Telehealth: Payer: Self-pay

## 2023-12-10 NOTE — Telephone Encounter (Signed)
 Patient needing samples of Gemtesa for bladder spasms.  Will be sending daughter by to pick up today.

## 2023-12-10 NOTE — Telephone Encounter (Signed)
Samples placed up front 

## 2024-01-05 ENCOUNTER — Telehealth: Payer: Self-pay | Admitting: Urology

## 2024-01-05 DIAGNOSIS — N319 Neuromuscular dysfunction of bladder, unspecified: Secondary | ICD-10-CM

## 2024-01-05 NOTE — Telephone Encounter (Signed)
 Return call to patient who stated home health is sending out the on call nurse. Patient states home health changed his sp tube on 9/25. He woke up today with no urine going into the urine bag and voiding around catheter. Patient states both foley bag and urine bag was free of kinks.  Patient states that he spoke of possibly up-sizing catheter with provider in the past.   Ok to up size or give flush orders for home health?

## 2024-01-05 NOTE — Telephone Encounter (Signed)
 Patient called into the office today with general questions/concerns regarding cath coming out and nothing going in bag Patient may be reached at [phone] to discuss questions.

## 2024-01-07 NOTE — Telephone Encounter (Signed)
 Patient calling back about SP tube sizing up

## 2024-01-07 NOTE — Telephone Encounter (Signed)
 Return call to [facility] clinical manger from [facility]. Demetria state's she reach out to pt PCP Dr. Briana nurse state's there no order given for an SP Tube up sizes. PCP wants pt's urologist to make that call to up sized SP Tube. Demetria does state pt has been having leaking for about 2-3 weeks and home health nurse has to come out to change SP Tube often. Demetria is aware pt was last seen by NP in April and has not seen Dr. Sherrilee in over three years  Shelli is aware that a message will be sent to Dr. Sherrilee regarding up sizing pt SP Tube. Pt current has a size 16 for SP Tube. Demetria is made aware I will follow up with MD and reach out with his recommendations. Verbalized understanding

## 2024-01-07 NOTE — Telephone Encounter (Signed)
 Return call to pt about up sizing SP tube. Pt state's there is no urine output to SP tube cath and urine is coming around the cath.  Pt state's he has sediment.  Pt state's that he contact his PCP for his SP Tube to be up sized. Pt state's Dr. Kolombo gave an order for the up sized of his SP Tube,his home health nurse will be coming out to up sized his SP Tube. Pt gave phone number for his PCP and nurse [phone]. Pt is made aware message will be sent to Dr. Sherrilee. Verbalized understanding

## 2024-01-12 MED ORDER — MIRABEGRON ER 50 MG PO TB24: 50.0000 mg | ORAL_TABLET | Freq: Every day | ORAL | 3 refills | Status: DC

## 2024-01-12 NOTE — Telephone Encounter (Signed)
 Patient was sent in RX and he does not have drug coverage on his insurance wants to know if we have samples he should have insurance fixed oct 15

## 2024-01-12 NOTE — Telephone Encounter (Signed)
 Pt is made aware there is no sample of Myrbetriq  50 mg. Pt was made aware a message will be sent to Dr. Sherrilee on alternative. Verbalized understanding

## 2024-01-12 NOTE — Telephone Encounter (Signed)
 Per Dr. Sherrilee Start him on mirabegron  50mg  daily pt made aware and verbalized understanding.

## 2024-01-26 MED ORDER — TROSPIUM CHLORIDE 20 MG PO TABS: 20.0000 mg | ORAL_TABLET | Freq: Two times a day (BID) | ORAL | 3 refills | Status: DC

## 2024-01-26 NOTE — Telephone Encounter (Signed)
 Pt was made aware and voiced understanding Trospium 20mg  BID

## 2024-01-26 NOTE — Addendum Note (Signed)
 Addended by: GRETTA MASTERS R on: 01/26/2024 10:24 AM   Modules accepted: Orders

## 2024-03-22 ENCOUNTER — Telehealth: Payer: Self-pay

## 2024-03-22 NOTE — Telephone Encounter (Signed)
 Pt states urine is not going into his catheter bag like it should is going through his penis, urine feels like it is being backed up, pt states no urine is going into the bag pt currently is using a 16 FR catheter (SP Tube) SP tube placed in July last cath change 12/13 been clogged since has been getting it changed weekly unable to flushed due to being clogged per verbal from MD McKenzie, pt catheter can be upsided to a 18 FR and flushed regularly.  Pt voiced his understanding and requested we call his home health to give them verbal orders [phone] I spoke with crystal and verbal orders were given

## 2024-03-24 ENCOUNTER — Emergency Department (HOSPITAL_COMMUNITY)

## 2024-03-24 ENCOUNTER — Emergency Department (HOSPITAL_COMMUNITY)
Admission: EM | Admit: 2024-03-24 | Source: Home / Self Care | Attending: Emergency Medicine | Admitting: Emergency Medicine

## 2024-03-24 ENCOUNTER — Other Ambulatory Visit: Payer: Self-pay

## 2024-03-24 ENCOUNTER — Encounter (HOSPITAL_COMMUNITY): Payer: Self-pay

## 2024-03-24 DIAGNOSIS — N390 Urinary tract infection, site not specified: Secondary | ICD-10-CM | POA: Insufficient documentation

## 2024-03-24 DIAGNOSIS — B9689 Other specified bacterial agents as the cause of diseases classified elsewhere: Secondary | ICD-10-CM | POA: Diagnosis not present

## 2024-03-24 DIAGNOSIS — K5900 Constipation, unspecified: Secondary | ICD-10-CM

## 2024-03-24 DIAGNOSIS — R1030 Lower abdominal pain, unspecified: Secondary | ICD-10-CM | POA: Diagnosis present

## 2024-03-24 DIAGNOSIS — R109 Unspecified abdominal pain: Secondary | ICD-10-CM

## 2024-03-24 LAB — URINALYSIS, W/ REFLEX TO CULTURE (INFECTION SUSPECTED)
Bilirubin Urine: NEGATIVE
Glucose, UA: NEGATIVE mg/dL
Ketones, ur: NEGATIVE mg/dL
Nitrite: POSITIVE — AB
Protein, ur: 100 mg/dL — AB
RBC / HPF: >50 RBC/hpf (ref 0–5)
Specific Gravity, Urine: 1.014 (ref 1.005–1.030)
WBC, UA: >50 WBC/hpf (ref 0–5)
pH: 6 (ref 5.0–8.0)

## 2024-03-24 LAB — CBC WITH DIFFERENTIAL/PLATELET
Abs Immature Granulocytes: 0 K/uL (ref 0.00–0.07)
Basophils Absolute: 0 K/uL (ref 0.0–0.1)
Basophils Relative: 1 %
Eosinophils Absolute: 0.2 K/uL (ref 0.0–0.5)
Eosinophils Relative: 3 %
HCT: 37.8 % — ABNORMAL LOW (ref 39.0–52.0)
Hemoglobin: 12.6 g/dL — ABNORMAL LOW (ref 13.0–17.0)
Immature Granulocytes: 0 %
Lymphocytes Relative: 39 %
Lymphs Abs: 1.8 K/uL (ref 0.7–4.0)
MCH: 26.9 pg (ref 26.0–34.0)
MCHC: 33.3 g/dL (ref 30.0–36.0)
MCV: 80.6 fL (ref 80.0–100.0)
Monocytes Absolute: 0.4 K/uL (ref 0.1–1.0)
Monocytes Relative: 8 %
Neutro Abs: 2.3 K/uL (ref 1.7–7.7)
Neutrophils Relative %: 49 %
Platelets: 389 K/uL (ref 150–400)
RBC: 4.69 MIL/uL (ref 4.22–5.81)
RDW: 14.8 % (ref 11.5–15.5)
WBC: 4.6 K/uL (ref 4.0–10.5)
nRBC: 0 % (ref 0.0–0.2)

## 2024-03-24 LAB — LACTIC ACID, PLASMA: Lactic Acid, Venous: 1.3 mmol/L (ref 0.5–1.9)

## 2024-03-24 LAB — COMPREHENSIVE METABOLIC PANEL WITH GFR
ALT: 7 U/L (ref 0–44)
AST: 14 U/L — ABNORMAL LOW (ref 15–41)
Albumin: 4.2 g/dL (ref 3.5–5.0)
Alkaline Phosphatase: 108 U/L (ref 38–126)
Anion gap: 15 (ref 5–15)
BUN: 11 mg/dL (ref 6–20)
CO2: 20 mmol/L — ABNORMAL LOW (ref 22–32)
Calcium: 10 mg/dL (ref 8.9–10.3)
Chloride: 106 mmol/L (ref 98–111)
Creatinine, Ser: 0.31 mg/dL — ABNORMAL LOW (ref 0.61–1.24)
GFR, Estimated: >60 mL/min (ref >60)
Glucose, Bld: 94 mg/dL (ref 70–99)
Potassium: 4 mmol/L (ref 3.5–5.1)
Sodium: 141 mmol/L (ref 135–145)
Total Bilirubin: 0.3 mg/dL (ref 0.0–1.2)
Total Protein: 7.9 g/dL (ref 6.5–8.1)

## 2024-03-24 MED ORDER — CEPHALEXIN 500 MG PO CAPS: 500.0000 mg | ORAL_CAPSULE | Freq: Three times a day (TID) | ORAL | 0 refills | Status: AC

## 2024-03-24 MED ORDER — IOHEXOL 300 MG/ML  SOLN
100.0000 mL | Freq: Once | INTRAMUSCULAR | Status: AC | PRN
  Administered 2024-03-24: 13:00:00 100 mL via INTRAVENOUS

## 2024-03-24 MED ORDER — SMOG ENEMA
960.0000 mL | Freq: Once | RECTAL | Status: AC
  Administered 2024-03-24: 15:00:00 960 mL via RECTAL
  Filled 2024-03-24: qty 960

## 2024-03-24 MED ORDER — FLUCONAZOLE 150 MG PO TABS
150.0000 mg | ORAL_TABLET | Freq: Every day | ORAL | Status: DC
  Administered 2024-03-24: 11:00:00 150 mg via ORAL
  Filled 2024-03-24: qty 1

## 2024-03-24 MED ORDER — SODIUM CHLORIDE 0.9 % IV SOLN
2.0000 g | Freq: Once | INTRAVENOUS | Status: AC
  Administered 2024-03-24: 11:00:00 2 g via INTRAVENOUS
  Filled 2024-03-24: qty 20

## 2024-03-24 NOTE — ED Notes (Signed)
 Patient transported to CT

## 2024-03-24 NOTE — ED Notes (Signed)
 No feces noted during SMOG.

## 2024-03-24 NOTE — ED Triage Notes (Signed)
 Pt arrived via [facility] from home c/o lower abdominal pain and concern for possible recurrent UTI. Pt presents with chronic suprapubic catheter in place and reports [facility] Nurse changed his catheter this morning prior to calling EMS.

## 2024-03-24 NOTE — ED Notes (Signed)
 [facility] called to transport patient back home. Paramedic made aware

## 2024-03-24 NOTE — Discharge Instructions (Signed)
 Please take all antibiotics as directed.  He will not need to start taking the Keflex  until tomorrow.  Return to emergency department immediately for any new or worsening symptoms.  Please continue taking your medications for constipation.

## 2024-03-24 NOTE — ED Provider Notes (Cosign Needed)
 [name] EMERGENCY DEPARTMENT AT [facility] Provider Note   CSN: [id number] Arrival date & time: 03/24/24  9066     Patient presents with: Abdominal Pain   [name] is a 58 y.o. male.   Patient is a 58 year old male who presents emergency department the chief complaint of suprapubic abdominal pain.  Patient notes that he has had his suprapubic catheter changed multiple times over the past week.  The most recent time was this morning and notes that the home health nurse had a difficult time getting the suprapubic catheter back in.  He is concerned that he has a urinary tract infection at this time.  He denies any associated fever or chills.  He has had no associated nausea, vomiting, diarrhea.   Abdominal Pain      Prior to Admission medications  Medication Sig Start Date End Date Taking? Authorizing Provider  acetaminophen  (TYLENOL ) 325 MG tablet Take 2 tablets (650 mg total) by mouth every 6 (six) hours as needed for mild pain (pain score 1-3) (or Fever >/= 101). 08/07/23   Pearlean Manus, MD  albuterol  (PROVENTIL ) (2.5 MG/3ML) 0.083% nebulizer solution Take 3 mLs (2.5 mg total) by nebulization every 6 (six) hours as needed for wheezing or shortness of breath. 08/07/23   Pearlean Manus, MD  ammonium lactate  (LAC-HYDRIN ) 12 % lotion Apply topically daily. 08/08/23   Pearlean Manus, MD  baclofen  (LIORESAL ) 10 MG tablet Take 1 tablet (10 mg total) by mouth 2 (two) times daily. 08/07/23   Pearlean Manus, MD  benzonatate (TESSALON) 100 MG capsule Take 100 mg by mouth 3 (three) times daily as needed for cough. 06/11/23   [provider]  bisacodyl  (DULCOLAX) 10 MG suppository Place 1 suppository (10 mg total) rectally as needed for moderate constipation. 08/07/23   Pearlean Manus, MD  bisacodyl  (DULCOLAX) 10 MG suppository Place 1 suppository (10 mg total) rectally 2 (two) times a week. 08/10/23   Pearlean Manus, MD  fluticasone  (FLONASE ) 50 MCG/ACT nasal  spray Place 1-2 sprays into both nostrils daily as needed for allergies. 01/06/19   [provider]  gabapentin  (NEURONTIN ) 600 MG tablet Take 600 mg by mouth 3 (three) times daily. 09/26/19   [provider]  hydrOXYzine  (ATARAX ) 25 MG tablet Take 1 tablet (25 mg total) by mouth 3 (three) times daily as needed for anxiety. 08/07/23   Pearlean Manus, MD  liver oil-zinc  oxide (DESITIN) 40 % ointment Apply topically 2 (two) times daily. 08/07/23   Pearlean Manus, MD  midodrine  (PROAMATINE ) 5 MG tablet Take 1 tablet (5 mg total) by mouth 3 (three) times daily with meals. Patient taking differently: Take 5 mg by mouth 3 (three) times daily as needed (low bp). 11/05/19   Pearlean Manus, MD  pantoprazole  (PROTONIX ) 40 MG tablet Take 1 tablet (40 mg total) by mouth daily. 10/18/19   Pearlean Manus, MD  senna-docusate (SENOKOT-S) 8.6-50 MG tablet Take 2 tablets by mouth 2 (two) times daily. 08/07/23   Pearlean Manus, MD  Triamcinolone Acetonide 0.025 % LOTN Apply 1 Application topically in the morning and at bedtime.    [provider]  trospium  (SANCTURA ) 20 MG tablet Take 1 tablet (20 mg total) by mouth 2 (two) times daily. 01/26/24   McKenzie, Belvie CROME, MD    Allergies: Niacin, Other, and Shellfish allergy    Review of Systems  Gastrointestinal:  Positive for abdominal pain.  All other systems reviewed and are negative.   Updated Vital Signs BP 114/81  Pulse 93   Temp 97.9 F (36.6 C) (Oral)   Resp 16   Ht 6' 1 (1.854 m)   Wt 58.1 kg   SpO2 99%   BMI 16.90 kg/m   Physical Exam Vitals and nursing note reviewed.  Constitutional:      General: He is not in acute distress.    Appearance: Normal appearance. He is not ill-appearing.  HENT:     Head: Normocephalic and atraumatic.     Nose: Nose normal.     Mouth/Throat:     Mouth: Mucous membranes are moist.  Eyes:     Extraocular Movements: Extraocular movements intact.     Conjunctiva/sclera:  Conjunctivae normal.     Pupils: Pupils are equal, round, and reactive to light.  Cardiovascular:     Rate and Rhythm: Normal rate and regular rhythm.     Pulses: Normal pulses.     Heart sounds: Normal heart sounds. No murmur heard.    No gallop.  Pulmonary:     Effort: Pulmonary effort is normal.     Breath sounds: Normal breath sounds.  Abdominal:     General: Abdomen is flat. Bowel sounds are normal. There is no distension.     Palpations: Abdomen is soft.     Tenderness: There is abdominal tenderness in the suprapubic area.     Comments: Suprapubic Foley catheter in place with no surrounding erythema or purulent discharge  Musculoskeletal:        General: Normal range of motion.     Cervical back: Normal range of motion and neck supple.  Skin:    General: Skin is warm and dry.  Neurological:     General: No focal deficit present.     Mental Status: He is alert and oriented to person, place, and time. Mental status is at baseline.  Psychiatric:        Mood and Affect: Mood normal.        Behavior: Behavior normal.        Thought Content: Thought content normal.        Judgment: Judgment normal.     (all labs ordered are listed, but only abnormal results are displayed) Labs Reviewed  CULTURE, BLOOD (ROUTINE X 2)  CULTURE, BLOOD (ROUTINE X 2)  COMPREHENSIVE METABOLIC PANEL WITH GFR  LACTIC ACID , PLASMA  LACTIC ACID , PLASMA  CBC WITH DIFFERENTIAL/PLATELET  URINALYSIS, W/ REFLEX TO CULTURE (INFECTION SUSPECTED)    EKG: None  Radiology: No results found.   Procedures   Medications Ordered in the ED - No data to display                                  Medical Decision Making Amount and/or Complexity of Data Reviewed Labs: ordered. Radiology: ordered.  Risk Prescription drug management.   This patient presents to the ED for concern of abdominal pain differential diagnosis includes urinary tract infection, pyelonephritis, constipation, acute appendicitis,  cholecystitis, small bowel torsion, diverticulitis, mesenteric ischemia, kidney stone    Additional history obtained:  Additional history obtained from medical records External records from outside source obtained and reviewed including medical records   Lab Tests:  I Ordered, and personally interpreted labs.  The pertinent results include: No leukocytosis, anemia at baseline, normal kidney function liver function, unremarkable electrolytes, normal lactic acid , urinalysis with large hemoglobin, leukocytes and nitrite positive   Imaging Studies ordered:  I ordered imaging studies including CT  scan abdomen and pelvis I independently visualized and interpreted imaging which showed moderate stool burden, no other acute surgical process I agree with the radiologist interpretation   Medicines ordered and prescription drug management:  I ordered medication including Rocephin , smog enema, Diflucan  for urinary tract infection, he is within the urine Reevaluation of the patient after these medicines showed that the patient improved I have reviewed the patients home medicines and have made adjustments as needed   Problem List / ED Course:  Patient is doing well at this time and is stable for discharge home.  Vital signs have remained stable with no indication for sepsis.  He did receive a smog enema in the emergency department and I did perform a rectal exam with no indication for impaction at this point.  He did have good stool return after the enema.  He will continue his bowel regimen for this.  Will send urine culture.  Did review last urine culture results which was sensitive to cephalosporins.  No other acute surgical process was noted on CT scan of the abdomen and pelvis.  He does feel better at this time.  Close follow-up with PCP was discussed as well as strict turn precautions for any new or worsening symptoms.  Patient voiced understanding and had no additional questions.   Social  Determinants of Health:  None        Final diagnoses:  None    ED Discharge Orders     None          Daralene Lonni BIRCH, [place] 03/24/24 1545

## 2024-03-27 LAB — URINE CULTURE: Culture: >=100000 — AB

## 2024-03-28 ENCOUNTER — Telehealth (HOSPITAL_BASED_OUTPATIENT_CLINIC_OR_DEPARTMENT_OTHER): Payer: Self-pay | Admitting: *Deleted

## 2024-03-28 NOTE — Progress Notes (Signed)
 ED Antimicrobial Stewardship Positive Culture Follow Up   [name] is an 58 y.o. male who presented to [facility] with a chief complaint of  Chief Complaint  Patient presents with   Abdominal Pain    Recent Results (from the past 720 hours)  Urine Culture     Status: Abnormal   Collection Time: 03/24/24  9:58 AM   Specimen: Urine, Random  Result Value Ref Range Status   Specimen Description   Final    URINE, RANDOM Performed at [facility], [address] [place], [place] [address]    Special Requests   Final    NONE Reflexed from [id number] Performed at [facility], [address], [place], [place] [address]    Culture (A)  Final    >=100,000 COLONIES/mL ESCHERICHIA COLI 70,000 COLONIES/mL KLEBSIELLA AEROGENES    Report Status 03/27/2024 FINAL  Final   Organism ID, Bacteria ESCHERICHIA COLI (A)  Final   Organism ID, Bacteria KLEBSIELLA AEROGENES (A)  Final      Susceptibility   Klebsiella aerogenes - MIC*    CEFEPIME  <=0.12 SENSITIVE Sensitive     ERTAPENEM <=0.12 SENSITIVE Sensitive     CEFTRIAXONE  <=0.25 SENSITIVE Sensitive     CIPROFLOXACIN  <=0.06 SENSITIVE Sensitive     GENTAMICIN <=1 SENSITIVE Sensitive     NITROFURANTOIN  64 INTERMEDIATE Intermediate     TRIMETH /SULFA  <=20 SENSITIVE Sensitive     PIP/TAZO Value in next row Sensitive      <=4 SENSITIVEThis is a modified FDA-approved test that has been validated and its performance characteristics determined by the reporting laboratory.  This laboratory is certified under the Clinical Laboratory Improvement Amendments CLIA as qualified to perform high complexity clinical laboratory testing.    MEROPENEM  Value in next row Sensitive      <=4 SENSITIVEThis is a modified FDA-approved test that has been validated and its performance characteristics determined by the reporting laboratory.  This laboratory is certified under the Clinical Laboratory Improvement Amendments CLIA as qualified to perform high complexity  clinical laboratory testing.    * 70,000 COLONIES/mL KLEBSIELLA AEROGENES   Escherichia coli - MIC*    AMPICILLIN Value in next row Resistant      <=4 SENSITIVEThis is a modified FDA-approved test that has been validated and its performance characteristics determined by the reporting laboratory.  This laboratory is certified under the Clinical Laboratory Improvement Amendments CLIA as qualified to perform high complexity clinical laboratory testing.    CEFAZOLIN (URINE) Value in next row Sensitive      4 SENSITIVEThis is a modified FDA-approved test that has been validated and its performance characteristics determined by the reporting laboratory.  This laboratory is certified under the Clinical Laboratory Improvement Amendments CLIA as qualified to perform high complexity clinical laboratory testing.    CEFEPIME  Value in next row Sensitive      4 SENSITIVEThis is a modified FDA-approved test that has been validated and its performance characteristics determined by the reporting laboratory.  This laboratory is certified under the Clinical Laboratory Improvement Amendments CLIA as qualified to perform high complexity clinical laboratory testing.    ERTAPENEM Value in next row Sensitive      4 SENSITIVEThis is a modified FDA-approved test that has been validated and its performance characteristics determined by the reporting laboratory.  This laboratory is certified under the Clinical Laboratory Improvement Amendments CLIA as qualified to perform high complexity clinical laboratory testing.    CEFTRIAXONE  Value in next row Sensitive  4 SENSITIVEThis is a modified FDA-approved test that has been validated and its performance characteristics determined by the reporting laboratory.  This laboratory is certified under the Clinical Laboratory Improvement Amendments CLIA as qualified to perform high complexity clinical laboratory testing.    CIPROFLOXACIN  Value in next row Resistant      4 SENSITIVEThis is  a modified FDA-approved test that has been validated and its performance characteristics determined by the reporting laboratory.  This laboratory is certified under the Clinical Laboratory Improvement Amendments CLIA as qualified to perform high complexity clinical laboratory testing.    GENTAMICIN Value in next row Sensitive      4 SENSITIVEThis is a modified FDA-approved test that has been validated and its performance characteristics determined by the reporting laboratory.  This laboratory is certified under the Clinical Laboratory Improvement Amendments CLIA as qualified to perform high complexity clinical laboratory testing.    NITROFURANTOIN  Value in next row Sensitive      4 SENSITIVEThis is a modified FDA-approved test that has been validated and its performance characteristics determined by the reporting laboratory.  This laboratory is certified under the Clinical Laboratory Improvement Amendments CLIA as qualified to perform high complexity clinical laboratory testing.    TRIMETH /SULFA  Value in next row Resistant      4 SENSITIVEThis is a modified FDA-approved test that has been validated and its performance characteristics determined by the reporting laboratory.  This laboratory is certified under the Clinical Laboratory Improvement Amendments CLIA as qualified to perform high complexity clinical laboratory testing.    AMPICILLIN/SULBACTAM Value in next row Sensitive      4 SENSITIVEThis is a modified FDA-approved test that has been validated and its performance characteristics determined by the reporting laboratory.  This laboratory is certified under the Clinical Laboratory Improvement Amendments CLIA as qualified to perform high complexity clinical laboratory testing.    PIP/TAZO Value in next row Sensitive      <=4 SENSITIVEThis is a modified FDA-approved test that has been validated and its performance characteristics determined by the reporting laboratory.  This laboratory is certified  under the Clinical Laboratory Improvement Amendments CLIA as qualified to perform high complexity clinical laboratory testing.    MEROPENEM  Value in next row Sensitive      <=4 SENSITIVEThis is a modified FDA-approved test that has been validated and its performance characteristics determined by the reporting laboratory.  This laboratory is certified under the Clinical Laboratory Improvement Amendments CLIA as qualified to perform high complexity clinical laboratory testing.    * >=100,000 COLONIES/mL ESCHERICHIA COLI  Culture, blood (routine x 2)     Status: None (Preliminary result)   Collection Time: 03/24/24 10:11 AM   Specimen: BLOOD  Result Value Ref Range Status   Specimen Description BLOOD BLOOD RIGHT ARM  Final   Special Requests   Final    BOTTLES DRAWN AEROBIC AND ANAEROBIC Blood Culture adequate volume   Culture   Final    NO GROWTH 4 DAYS Performed at [facility], [address], [place], [place] [address]    Report Status PENDING  Incomplete  Culture, blood (routine x 2)     Status: None (Preliminary result)   Collection Time: 03/24/24 10:14 AM   Specimen: BLOOD  Result Value Ref Range Status   Specimen Description BLOOD BLOOD LEFT HAND  Final   Special Requests   Final    BOTTLES DRAWN AEROBIC AND ANAEROBIC Blood Culture adequate volume   Culture   Final    NO GROWTH 4  DAYS Performed at [facility], [address] [place], [place] [address]    Report Status PENDING  Incomplete    Patient growing multiple organisms, no single antibitoic to cover both. Klebsiella aerogenes < 100 K, unclear if causing symptoms versus E.coli.   Symptom check -   Symptoms resolved, no changes.  Persistent symptoms, add cipro  500mg  BID x 7days.   ED Provider: Rolan Quale, MD   Powell Blush, PharmD, BCCCP  03/28/2024, 10:21 AM Clinical Pharmacist Monday - Friday phone -  [phone] Saturday - Sunday phone - [phone]

## 2024-03-28 NOTE — Telephone Encounter (Signed)
 Post ED Visit - Positive Culture Follow-up  Culture report reviewed by antimicrobial stewardship pharmacist: [facility] []  Rankin Dee, Pharm.D. []  Venetia Gully, Pharm.D., BCPS AQ-ID []  Garrel Crews, Pharm.D., BCPS []  Almarie Lunger, Pharm.D., BCPS []  [place], [address].D., BCPS, AAHIVP []  Rosaline Bihari, Pharm.D., BCPS, AAHIVP []  Vernell Meier, PharmD, BCPS []  Latanya Hint, PharmD, BCPS []  Donald Medley, PharmD, BCPS []  Rocky Bold, PharmD []  Dorothyann Alert, PharmD, BCPS []  Morene Babe, PharmD  [facility] []  Rosaline Edison, PharmD []  Romona Bliss, PharmD []  Dolphus Roller, PharmD []  Veva Seip, Rph []  Vernell Daunt) Leonce, PharmD []  Eva Allis, PharmD []  Rosaline Millet, PharmD []  Iantha Batch, PharmD []  Arvin Gauss, PharmD []  Wanda Hasting, PharmD []  Ronal Rav, PharmD []  Rocky Slade, PharmD []  Bard Jeans, PharmD   Positive urine culture reviewed by Rolan Quale MD.  Called to patient to symptom check. Patient stated he is improving and things are moving in the right direction. Patient home health nurse present during the call.  Treated with Cephalexin , organism sensitive to the same and no further patient follow-up is required at this time.  Jama Lisle Blondie 03/28/2024, 10:30 AM

## 2024-03-29 LAB — CULTURE, BLOOD (ROUTINE X 2)
Culture: NO GROWTH
Culture: NO GROWTH
Special Requests: ADEQUATE
Special Requests: ADEQUATE

## 2024-04-27 ENCOUNTER — Encounter: Payer: Self-pay | Admitting: Urology

## 2024-04-27 ENCOUNTER — Ambulatory Visit: Admitting: Urology

## 2024-04-27 VITALS — BP 88/60 | HR 89

## 2024-04-27 DIAGNOSIS — N319 Neuromuscular dysfunction of bladder, unspecified: Secondary | ICD-10-CM | POA: Diagnosis not present

## 2024-04-27 DIAGNOSIS — Z960 Presence of urogenital implants: Secondary | ICD-10-CM

## 2024-04-27 DIAGNOSIS — Z978 Presence of other specified devices: Secondary | ICD-10-CM

## 2024-04-27 MED ORDER — ACETIC ACID 4% SOLUTION: 1.0000 | Freq: Two times a day (BID) | 99 refills | Status: AC | PRN

## 2024-04-27 MED ORDER — TROSPIUM CHLORIDE 20 MG PO TABS: 20.0000 mg | ORAL_TABLET | Freq: Two times a day (BID) | ORAL | 11 refills | Status: AC

## 2024-04-27 NOTE — Patient Instructions (Signed)
 Nerve Problems With the Bladder (Neurogenic Bladder): What to Know  Neurogenic bladder is a problem with the nerves that help you pee. Your brain sends signals to the nerves and muscles in your bladder to start and stop the flow of pee. If you have neurogenic bladder, these nerves and muscles don't work the way they should. This may mean that your bladder is: Overactive. This means you have trouble holding your pee. Underactive. This means you have trouble peeing. What are the causes? Nerve problems with your bladder may be caused by nerve damage or by a condition that changes the signals from your brain to your bladder. Many things can cause this. They include: A disease, such as: Cerebral palsy. Multiple sclerosis. Parkinson's disease. Damage to your brain or spinal cord. This can come from: Tumors. These are growths of cells that aren't normal. An infection. Surgery. Drinking a lot of alcohol. A stroke. What increases the risk? You're more likely to get neurogenic bladder if you have: Nerve damage. A nerve disease that you were born with. What are the signs or symptoms? Leaking or gushing pee. A sudden, strong need to pee. Peeing a lot during the day and night. Not being able to pee fully. A lot of urinary tract infections (UTIs). How is this diagnosed? You may be diagnosed based on your symptoms, medical history, and an exam. You may be asked to keep a log of your symptoms and when you pee. You may also have tests. These may include: A test of your pee to check for an infection. A scan of your bladder. This checks how much pee is left in your bladder after you pee. Tests to check the flow of your pee. These are called urodynamic tests. A test to look at your bladder with a camera. Imaging tests of your brain or spine, such as: An MRI. A CT scan. You may also need to see an expert in treating problems with the bladder called a urologist. How is this treated? Treatment  depends on what caused the nerve problems and what symptoms you have. Work closely with your health care provider to find the best treatments for you. They may include: Learning ways to control when you pee, such as: Peeing at set times. Training yourself to delay peeing. Doing exercises to make the muscles in your bladder stronger. These are called Kegel exercises. Staying away from foods and drinks that make your symptoms worse. Taking medicines to: Make an underactive bladder work. Calm an overactive bladder. Treat a UTI. Using a tube called a catheter to empty your bladder. Having surgery to help the nerves that control your bladder. Follow these instructions at home: Lifestyle Keep a diary of what foods, drinks, and other things make your symptoms worse. Use your diary to set times that you'll try to pee. If you're away from home, plan to be near a bathroom when needed. Limit drinks that can make you pee. These include soda, coffee, and tea. After you pee, wait a minute and try again. Make sure you pee just before you leave the house and just before you go to bed. Kegel exercises Do Kegel exercises to help make your bladder muscles stronger. These are the muscles that you use to try to hold it when you need to pee. To do the exercises: Squeeze the muscles tight, as if you're trying to stop peeing. You should feel your muscles lift. If you're male, you may also feel a lift in the area around  your vagina. Keep your belly, butt, and legs relaxed. Hold the muscles tight for 5-10 seconds. Relax your muscles for 5-10 seconds. Repeat 10 times. Do this exercise 3 times a day or as many times as told. General instructions Take your medicines only as told. Contact a health care provider if: Your symptoms don't get better. Your symptoms get worse. You have signs of a UTI. These may include: A burning feeling when you pee. Fever or chills. Cloudy or bloody pee. Get help right away  if: You can't pee. This information is not intended to replace advice given to you by your health care provider. Make sure you discuss any questions you have with your health care provider. Document Revised: 10/20/2022 Document Reviewed: 10/20/2022 Elsevier Patient Education  2024 [employer].
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
## 2024-05-03 ENCOUNTER — Telehealth: Payer: Self-pay | Admitting: Urology

## 2024-05-03 NOTE — Telephone Encounter (Signed)
 SABRA

## 2024-05-03 NOTE — Telephone Encounter (Signed)
 Patient made aware to instill 30 mL into bladder through catheter; clamp for 10 minutes, then remove clamp and drain the bladder. May repeat every 12 hours as needed.  Patient voiced understanding.

## 2024-11-09 ENCOUNTER — Ambulatory Visit: Admitting: Urology
